# Patient Record
Sex: Male | Born: 1935 | Race: White | Hispanic: No | Smoking: Never smoker
Health system: Southern US, Community
[De-identification: ages and names within clinical notes are randomized; demographics above are authoritative.]

## PROBLEM LIST (undated history)

## (undated) DIAGNOSIS — E7800 Pure hypercholesterolemia, unspecified: Secondary | ICD-10-CM

## (undated) DIAGNOSIS — G459 Transient cerebral ischemic attack, unspecified: Secondary | ICD-10-CM

## (undated) DIAGNOSIS — D509 Iron deficiency anemia, unspecified: Secondary | ICD-10-CM

## (undated) DIAGNOSIS — K219 Gastro-esophageal reflux disease without esophagitis: Secondary | ICD-10-CM

## (undated) DIAGNOSIS — I10 Essential (primary) hypertension: Secondary | ICD-10-CM

## (undated) DIAGNOSIS — J309 Allergic rhinitis, unspecified: Secondary | ICD-10-CM

## (undated) DIAGNOSIS — M1990 Unspecified osteoarthritis, unspecified site: Secondary | ICD-10-CM

## (undated) DIAGNOSIS — E119 Type 2 diabetes mellitus without complications: Secondary | ICD-10-CM

## (undated) DIAGNOSIS — N139 Obstructive and reflux uropathy, unspecified: Secondary | ICD-10-CM

## (undated) DIAGNOSIS — N400 Enlarged prostate without lower urinary tract symptoms: Secondary | ICD-10-CM

## (undated) DIAGNOSIS — C4490 Unspecified malignant neoplasm of skin, unspecified: Secondary | ICD-10-CM

## (undated) HISTORY — DX: Pure hypercholesterolemia, unspecified: E78.00

## (undated) HISTORY — DX: Iron deficiency anemia, unspecified: D50.9

## (undated) HISTORY — PX: SKIN CANCER EXCISION: SHX779

## (undated) HISTORY — DX: Essential (primary) hypertension: I10

## (undated) HISTORY — DX: Unspecified malignant neoplasm of skin, unspecified: C44.90

## (undated) HISTORY — PX: CATARACT EXTRACTION: SUR2

## (undated) HISTORY — DX: Benign prostatic hyperplasia without lower urinary tract symptoms: N40.0

## (undated) HISTORY — DX: Unspecified osteoarthritis, unspecified site: M19.90

## (undated) HISTORY — DX: Gastro-esophageal reflux disease without esophagitis: K21.9

## (undated) HISTORY — PX: SQUAMOUS CELL CARCINOMA EXCISION: SHX2433

## (undated) HISTORY — DX: Obstructive and reflux uropathy, unspecified: N13.9

## (undated) HISTORY — DX: Allergic rhinitis, unspecified: J30.9

## (undated) HISTORY — DX: Type 2 diabetes mellitus without complications: E11.9

## (undated) HISTORY — DX: Transient cerebral ischemic attack, unspecified: G45.9

## (undated) MED FILL — Iron Sucrose Inj 20 MG/ML (Fe Equiv): INTRAVENOUS | Qty: 10 | Status: AC

---
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
## 1993-11-05 HISTORY — PX: ROTATOR CUFF REPAIR: SHX139

## 2004-08-22 ENCOUNTER — Ambulatory Visit: Payer: Self-pay | Admitting: Gastroenterology

## 2005-07-10 ENCOUNTER — Ambulatory Visit: Payer: Self-pay | Admitting: Gastroenterology

## 2006-05-21 ENCOUNTER — Ambulatory Visit: Payer: Self-pay | Admitting: Unknown Physician Specialty

## 2006-12-05 ENCOUNTER — Ambulatory Visit: Payer: Self-pay | Admitting: Cardiology

## 2007-01-29 DIAGNOSIS — C4491 Basal cell carcinoma of skin, unspecified: Secondary | ICD-10-CM

## 2007-01-29 HISTORY — DX: Basal cell carcinoma of skin, unspecified: C44.91

## 2007-04-01 DIAGNOSIS — C4492 Squamous cell carcinoma of skin, unspecified: Secondary | ICD-10-CM

## 2007-04-01 HISTORY — DX: Squamous cell carcinoma of skin, unspecified: C44.92

## 2007-06-15 ENCOUNTER — Emergency Department (HOSPITAL_COMMUNITY): Admission: EM | Admit: 2007-06-15 | Discharge: 2007-06-15 | Payer: Self-pay | Admitting: Emergency Medicine

## 2007-06-23 ENCOUNTER — Ambulatory Visit: Payer: Self-pay | Admitting: Cardiology

## 2007-06-25 ENCOUNTER — Ambulatory Visit: Payer: Self-pay

## 2007-07-15 ENCOUNTER — Ambulatory Visit: Payer: Self-pay | Admitting: Cardiology

## 2007-07-16 ENCOUNTER — Ambulatory Visit: Payer: Self-pay | Admitting: Cardiology

## 2010-07-07 ENCOUNTER — Encounter: Payer: Self-pay | Admitting: Cardiovascular Disease

## 2010-08-01 ENCOUNTER — Telehealth: Payer: Self-pay | Admitting: Cardiology

## 2010-08-15 ENCOUNTER — Ambulatory Visit: Payer: Self-pay | Admitting: Cardiovascular Disease

## 2010-08-15 DIAGNOSIS — E7800 Pure hypercholesterolemia, unspecified: Secondary | ICD-10-CM | POA: Insufficient documentation

## 2010-08-15 DIAGNOSIS — I10 Essential (primary) hypertension: Secondary | ICD-10-CM | POA: Insufficient documentation

## 2010-08-15 DIAGNOSIS — R002 Palpitations: Secondary | ICD-10-CM | POA: Insufficient documentation

## 2010-08-15 HISTORY — DX: Essential (primary) hypertension: I10

## 2010-12-05 NOTE — Progress Notes (Signed)
Summary: pt has irregular heart rate   Phone Note Call from Patient Call back at Home Phone [phone]   Caller: Patient Reason for Call: Talk to Nurse, Talk to Doctor Summary of Call: pt has had an irregular heart rate for several days and some SOB but not much and wants to know if he needs to be seen asap or can he wait for next avail. Initial call taken by: Omer Jack,  August 01, 2010 10:40 AM  Follow-up for Phone Call        I spoke with the pt. He states that over the last several days, he has noticed his heart skipping in the afternoons and evenings. He denies any CP or SOB. He had a workup in 2008 for chest discomfort and palpitations, which was essentially normal. The pt states he thinks his palpitations are similar to 2008. I explained I do not think this is urgent. He states he has talked with his PCP about his symptoms, and he was directed back to cardiology. I explained I will review with Dr. Daleen Squibb and call him back. He is agreeable.  Follow-up by: Sherri Rad, RN, BSN,  August 01, 2010 11:00 AM  Additional Follow-up for Phone Call Additional follow up Details #1::        I reviewed the above with Dr. Daleen Squibb. He states he would like the pt to see Dr. Mariah Milling in [place] since he has not seen the pt in 3 years. I have made the pt aware of this and he is agreeable. He will call our office back should his palpitations become more irregular or he become symptomatic with them. He voices understanding. Additional Follow-up by: Sherri Rad, RN, BSN,  August 01, 2010 11:55 AM

## 2010-12-05 NOTE — Assessment & Plan Note (Signed)
Summary: ec6   Visit Type:  Initial Consult Referring Provider:  Dr. Daleen Squibb Primary Provider:  Herbie Drape.  CC:  c/o palpitations..  History of Present Illness: [name] is a pleasant 77 yo male with a h/o HTN, diet controled diabetes, hyperlipidemia, palpitations 3 years ago presenting with palpitations.   He reports that during the day he feels well and is active with no complaints. When he sits down on the couch, he has palpitations that last sometimes for several minutes. They are not rapid, come and go, are mild in nature. He is not particularly bothered by them.  He is otherwise active and walks 30 minutes a day. He denies any chest pain, SOB.   EKG shows NSR with rate of 66 BPM, LAD  Previous Holter monitor 3 years showed normal sinus rhythm per the notes  His labs from September 2011 showed total cholesterol 127, LDL 63, HDL 40, normal TSH, creatinine 0.9  Preventive Screening-Counseling & Management  Alcohol-Tobacco     Smoking Status: never  Caffeine-Diet-Exercise     Does Patient Exercise: yes      Drug Use:  no.    Current Medications (verified): 1)  Lovastatin 10 Mg Tabs (Lovastatin) .... One Tablet Once Daily 2)  Diovan Hct 320-12.5 Mg Tabs (Valsartan-Hydrochlorothiazide) .... One Tablet Once Daily 3)  Aspir-Low 81 Mg Tbec (Aspirin) .... One Tablet Once Daily 4)  Magnesium 250 Mg Tabs (Magnesium) .... One Tablet Once Daily 5)  Oscal 500/200 D-3 500-200 Mg-Unit Tabs (Calcium-Vitamin D) .... One Tablet Once Daily  Allergies (verified): No Known Drug Allergies  Past History:  Family History: Last updated: 26-Aug-2010 Father: MI; deceased age 3's.  Social History: Last updated: Aug 26, 2010 Retired  Married  Tobacco Use - No.  Alcohol Use - no Regular Exercise - yes-walks everyday Drug Use - no  Risk Factors: Exercise: yes (08/26/2010)  Risk Factors: Smoking Status: never (08-26-2010)  Past Medical  History: Hyperlipidemia Hypertension  Past Surgical History: pilonidal cyst in 1951 removed rotator cuff repair in 2005. tonsillectomy adenoidectomy  Family History: Father: MI; deceased age 92's.  Social History: Retired  Married  Tobacco Use - No.  Alcohol Use - no Regular Exercise - yes-walks everyday Drug Use - no Smoking Status:  never Does Patient Exercise:  yes Drug Use:  no  Review of Systems  The patient denies fever, weight loss, weight gain, vision loss, decreased hearing, hoarseness, chest pain, syncope, dyspnea on exertion, peripheral edema, prolonged cough, abdominal pain, incontinence, muscle weakness, depression, and enlarged lymph nodes.         palpitations  Vital Signs:  Patient profile:   77 year old male Height:      72 inches Weight:      222.75 pounds BMI:     30.32 Pulse rate:   66 / minute BP sitting:   151 / 80  (left arm) Cuff size:   regular  Vitals Entered By: Bishop Dublin, CMA (Aug 26, 2010 10:05 AM)  Physical Exam  General:  Well developed, well nourished, in no acute distress. Head:  normocephalic and atraumatic Neck:  Neck supple, no JVD. No masses, thyromegaly or abnormal cervical nodes. Lungs:  Clear bilaterally to auscultation and percussion. Heart:  Non-displaced PMI, chest non-tender; regular rate and rhythm, S1, S2 without murmurs, rubs or gallops. Carotid upstroke normal, no bruit.   Pedals normal pulses. No edema, no varicosities. Abdomen:  Bowel sounds positive; abdomen soft and non-tender without masses Msk:  Back normal, normal gait. Muscle  strength and tone normal. Pulses:  pulses normal in all 4 extremities Extremities:  No clubbing or cyanosis. Neurologic:  Alert and oriented x 3. Skin:  Intact without lesions or rashes. Psych:  Normal affect.   Impression & Recommendations:  Problem # 1:  PALPITATIONS, OCCASIONAL (ICD-785.1) etiology of his palpitations is likely secondary to APCs or PVCs. They are  rare, happen when he is at rest and he is not particularly bothered by them. He does not want to start a medication such as a beta Osborn for his symptoms Her last and contact me if his symptoms get much worse at which time we could perform a cardiac monitor.  His updated medication list for this problem includes:    Aspir-low 81 Mg Tbec (Aspirin) ..... One tablet once daily  Orders: EKG w/ Interpretation (93000)  Problem # 2:  HYPERTENSION, BENIGN (ICD-401.1) Blood pressure is well controlled on today's visit. I suggested that if he has difficulty paying for Diovan, Dr. Lin Givens or myself could potentially change him to losartan HCT. His blood pressure is borderline elevated and I've asked him to check his pressures at home.  His updated medication list for this problem includes:    Diovan Hct 320-12.5 Mg Tabs (Valsartan-hydrochlorothiazide) ..... One tablet once daily    Aspir-low 81 Mg Tbec (Aspirin) ..... One tablet once daily  Problem # 3:  HYPERLIPIDEMIA-MIXED (ICD-272.4) Cholesterol is well controlled on his current medication regimen.  His updated medication list for this problem includes:    Lovastatin 10 Mg Tabs (Lovastatin) ..... One tablet once daily

## 2011-03-20 NOTE — Assessment & Plan Note (Signed)
[facility] OFFICE NOTE   NAME:[name], [name]                    MRN:          [id number]  DATE:06/23/2007                            DOB:          February 15, 1926    [name] comes in today after visiting the emergency room at [facility] on June 15, 2007 with chest discomfort and palpitations.   He was working on a closet with his son on Friday the 9th. He did not  feel well so he went home. He had some chest heaviness that was just  there throughout the night. He  said it was a little bit worse when he  took a breath. Saturday morning continued to feel no so normal. On  Sunday morning he had some irregularity to his heart rhythm. He was  taken to [facility] by private vehicle.   There his exam was unrevealing. His CBC, chemistries, LFTs, cardiac  markers, and D-Dimer were unremarkable. His chest x-ray showed no acute  cardiopulmonary disease. I do not have a copy of the EKG.   Since then he has not had any further complaints.   As outlined in my original note December 05, 2006 he has multiple risk  factors.   PHYSICAL EXAMINATION:  His blood pressure is 110/62, his pulse is 78 and  regular. The rest of the exam is unchanged.   His EKG is normal except for an RSR prime in V1 and V2 with a left axis  deviation. This is unchanged from his previous ECG.   ASSESSMENT/PLAN:  With [name] discomfort and his multiple  cardiac risk factors, I think an exercise rest/stress Myoview is  indicated. This will rule out any obstructive coronary disease and I and  his wife have spoken today,   In regard to the palpitations, I think a CardioNet monitor for a couple  of weeks is in order.   We will arrange these. I will plan on seeing him back in a couple of  weeks.     Thomas C. Daleen Squibb, MD, [facility]  Electronically Signed    TCW/MedQ  DD: 06/23/2007  DT: 06/23/2007  Job #: [id number]   cc:   Francia Greaves, Dr.

## 2011-03-20 NOTE — Assessment & Plan Note (Signed)
[facility] OFFICE NOTE   NAME:PENNINGTONIsayah, [name]                    MRN:          [id number]  DATE:07/16/2007                            DOB:          [birth year]    Mr. [name] returns today to discuss the findings of his stress  Myoview as well as his CardioNet monitor.  His Myoview showed him to  exercise for 6 minutes and 15 seconds.  His peak heart rate was 116  beats per minute which is 82% of predicted maximum.  Met level achieved  was 7.   Scan showed normal contractility and normal thickening in all areas of  myocardium.  His EF was 76%.  There was no evidence of ischemia or scar.   His CardioNet monitor showed normal sinus rhythm.  He is ready to turn  it in.   I have reviewed the findings with Mr. [name] and his wife with a  heart model.  I do not see any reason to continue wearing the CardioNet  monitor at this time.  We will have him mail it back in.  I advised him  to stay with aspirin, Lovastatin, and Diovan hydrochlorothiazide as well  as his Niacin.  He will follow up with Dr. Francia Greaves in her good  care.  I will plan on seeing him back p.r.n.     Thomas C. Daleen Squibb, MD, [facility]  Electronically Signed    TCW/MedQ  DD: 07/16/2007  DT: 07/16/2007  Job #: [id number]   cc:   Francia Greaves, M.D., [facility]

## 2011-03-23 NOTE — Assessment & Plan Note (Signed)
[facility] OFFICE NOTE   NAME:[name]                    MRN:          [id number]  DATE:12/05/2006                            DOB:          July 11, 1926    [name] is a delightful 77 year old married white male,  husband of a patient of mine, who is self-referred today for a history  of hypertension and hyperlipidemia, and his concern about heart disease.   He is extremely active and walks on a regular basis, and helps his son  with remodeling and household repairs.  He denies any dyspnea on  exertion or chest discomfort, or any other ischemic symptoms.  He denies  any orthopnea, PND, peripheral edema.  He has had no problems with tachy  palpitations, presyncope or syncope.   He has had a history of hypertension, which is being managed by Dr.  Lin Givens.  He also has a history of hyperlipidemia.  He just recently  had blood work which I do not have a copy of.  He would like to bring a  copy to me.  I assured him that Dr. Lin Givens was on top of this, knowing  her excellent reputation.   PAST MEDICAL HISTORY:  He has no known drug allergies.  He does not  smoke, drink, use recreational drugs.  He drinks 1-2 cups of coffee a  day.   His meds are Diovan HCT 320/12.5 daily, lovastatin 20 mg a day,  ibuprofen 800 mg p.r.n., aspirin 81 mg daily, vitamin C and iron.   SURGICAL HISTORY:  1. He had a pilonidal cyst in 1951 removed.  2. Rotator cuff repair in 2005.  3. Tonsillectomy and adenoidectomy in 1940.   SOCIAL HISTORY:  He is retired.  He is married.  He has 2 children.   FAMILY HISTORY:  Positive for his father and his brother having heart  attacks in their early 15s, and both are deceased.   REVIEW OF SYSTEMS:  Other than the HPI, he has a history of some low  grade anemia.  Had an ulcer 40 years ago.  Has a history of chronic  arthritis and borderline diabetes, which is managed by  diet.   PHYSICAL EXAMINATION:  He looks remarkably good, and looks younger than  stated age.  His blood pressure is 153/83 in the right arm.  He assures me that it is  usually around 130-140.  His pulse is 67 and regular.  He is in sinus  rhythm with a left anterior fascicular block, otherwise unremarkable.  He is 6 feet tall, weighs 221 pounds.  HEENT:  Normocephalic, atraumatic.  PERRLA, extraocular movements  intact.  He wears glasses.  Facial symmetry is normal.  Dentition is  unremarkable.  Carotid upstrokes are equal bilaterally without bruits.  There is no JVD, thyroid is not enlarged, trachea is midline.  LUNGS:  Clear to auscultation.  HEART:  Reveals a nondisplaced PMI.  He has a soft systolic murmur along  the left sternal border, S2 split.  No diastolic murmur was appreciated.  ABDOMINAL EXAM:  Soft with good bowel  sounds.  There is no midline or  flank bruit.  There is no evidence of hepatomegaly.  EXTREMITIES:  Reveal no cyanosis, clubbing, or edema.  Pulses are  present.  NEUROLOGIC EXAM:  Intact.  MUSCULOSKELETAL:  Shows some chronic arthritic changes.  SKIN:  Intact and unremarkable.   ASSESSMENT:  [name] is doing well with currently no clinical  manifestations of atherosclerosis.  I assured him that considering his  age, his history of hypertension, borderline diabetes and  hyperlipidemia, that he probably has some atherosclerosis.  At this  point in time I see no reason for any sort of functional study.  I would  like to review his blood work.   We spent a lot of time talking about symptoms of an acute myocardial  infarction and his response with 911, etc.  He and his wife both  understand that time is the most important factor.     Thomas C. Daleen Squibb, MD, [facility]  Electronically Signed    TCW/MedQ  DD: 12/05/2006  DT: 12/05/2006  Job #: [id number]   cc:   Elnita Maxwell L. Lin Givens, MD

## 2011-04-24 ENCOUNTER — Encounter: Payer: Self-pay | Admitting: Cardiology

## 2011-05-30 ENCOUNTER — Telehealth: Payer: Self-pay

## 2011-05-30 NOTE — Telephone Encounter (Signed)
Needs samples of Diovan HCT 320/12.5 mg.  Samples given for one month supply.

## 2011-06-27 ENCOUNTER — Telehealth: Payer: Self-pay

## 2011-06-27 NOTE — Telephone Encounter (Signed)
Would like samples of diovan hct 320/12.5 mg tablets.  Gave samples of diovan hct 320/12.5 mg for one month supply.

## 2011-08-20 LAB — DIFFERENTIAL
Basophils Absolute: 0
Basophils Relative: 0
Eosinophils Relative: 1
Monocytes Absolute: 0.7
Neutro Abs: 6.1

## 2011-08-20 LAB — COMPREHENSIVE METABOLIC PANEL
AST: 20
Albumin: 3.1 — ABNORMAL LOW
Alkaline Phosphatase: 36 — ABNORMAL LOW
BUN: 17
CO2: 26
Chloride: 102
GFR calc non Af Amer: >60
Potassium: 4
Total Bilirubin: 0.8

## 2011-08-20 LAB — POCT CARDIAC MARKERS
CKMB, poc: 3
CKMB, poc: 3.1
Myoglobin, poc: 225
Myoglobin, poc: 265
Operator id: 272551
Operator id: 272551
Troponin i, poc: <0.05
Troponin i, poc: <0.05

## 2011-08-20 LAB — CBC
HCT: 38.5 — ABNORMAL LOW
Hemoglobin: 13.1
Platelets: 240
RBC: 4.1 — ABNORMAL LOW
WBC: 8.4

## 2011-11-12 DIAGNOSIS — M9981 Other biomechanical lesions of cervical region: Secondary | ICD-10-CM | POA: Diagnosis not present

## 2011-11-12 DIAGNOSIS — IMO0002 Reserved for concepts with insufficient information to code with codable children: Secondary | ICD-10-CM | POA: Diagnosis not present

## 2011-11-12 DIAGNOSIS — M999 Biomechanical lesion, unspecified: Secondary | ICD-10-CM | POA: Diagnosis not present

## 2011-11-12 DIAGNOSIS — M5030 Other cervical disc degeneration, unspecified cervical region: Secondary | ICD-10-CM | POA: Diagnosis not present

## 2011-11-27 DIAGNOSIS — D510 Vitamin B12 deficiency anemia due to intrinsic factor deficiency: Secondary | ICD-10-CM | POA: Diagnosis not present

## 2011-12-06 ENCOUNTER — Encounter: Payer: Self-pay | Admitting: Cardiovascular Disease

## 2011-12-06 ENCOUNTER — Ambulatory Visit (INDEPENDENT_AMBULATORY_CARE_PROVIDER_SITE_OTHER): Payer: Medicare Other | Admitting: Cardiovascular Disease

## 2011-12-06 DIAGNOSIS — E119 Type 2 diabetes mellitus without complications: Secondary | ICD-10-CM

## 2011-12-06 DIAGNOSIS — I10 Essential (primary) hypertension: Secondary | ICD-10-CM

## 2011-12-06 DIAGNOSIS — E785 Hyperlipidemia, unspecified: Secondary | ICD-10-CM | POA: Diagnosis not present

## 2011-12-06 DIAGNOSIS — R002 Palpitations: Secondary | ICD-10-CM

## 2011-12-06 NOTE — Progress Notes (Signed)
   Patient ID: [name], male    DOB: 1926/02/28, 78 y.o.   MRN: [id number]  HPI Comments: [name] is a pleasant 78 yo male with a h/o HTN, diet controled diabetes, hemoglobin A1c 7.1, hyperlipidemia, pernicious anemia, history of palpitations  Presenting 4 routine followup.  He reports that he has been doing well. He walks with his wife in the mall on a daily basis. His wife has had some underlying medical issues and walks slowly. He denies any shortness of breath, edema, chest pain, palpitations. Is tolerating his medications well. He was told that he might need to start a diabetes medication if he does not improve his numbers.   EKG shows NSR with rate of 72 BPM, LAD   Previous Holter monitor 3 years showed normal sinus rhythm per the notes  labwork from September 2012 shows total cholesterol 128, LDL 63, HDL 38, creatinine 0.9       Outpatient Encounter Prescriptions as of 12/06/2011  Medication Sig Dispense Refill  . aspirin (ASPIR-LOW) 81 MG EC tablet Take 81 mg by mouth daily.        . calcium-vitamin D (OSCAL 500/200 D-3) 500-200 MG-UNIT per tablet Take 1 tablet by mouth daily.        Marland Kitchen lovastatin (MEVACOR) 10 MG tablet Take 10 mg by mouth daily.        . Magnesium 250 MG TABS Take by mouth daily.        . NON FORMULARY B 12 injection monthly.      . valsartan-hydrochlorothiazide (DIOVAN HCT) 320-12.5 MG per tablet Take 1 tablet by mouth daily.           Review of Systems  Constitutional: Negative.   HENT: Negative.   Eyes: Negative.   Respiratory: Negative.   Cardiovascular: Negative.   Gastrointestinal: Negative.   Musculoskeletal: Negative.   Skin: Negative.   Neurological: Negative.   Hematological: Negative.   Psychiatric/Behavioral: Negative.   All other systems reviewed and are negative.    BP 135/80  Pulse 73  Ht 6' (1.829 m)  Wt 233 lb (105.688 kg)  BMI 31.60 kg/m2  Physical Exam  Nursing note and vitals reviewed. Constitutional: He is  oriented to person, place, and time. He appears well-developed and well-nourished.  HENT:  Head: Normocephalic.  Nose: Nose normal.  Mouth/Throat: Oropharynx is clear and moist.  Eyes: Conjunctivae are normal. Pupils are equal, round, and reactive to light.  Neck: Normal range of motion. Neck supple. No JVD present.  Cardiovascular: Normal rate, regular rhythm, S1 normal, S2 normal, normal heart sounds and intact distal pulses.  Exam reveals no gallop and no friction rub.   No murmur heard. Pulmonary/Chest: Effort normal and breath sounds normal. No respiratory distress. He has no wheezes. He has no rales. He exhibits no tenderness.  Abdominal: Soft. Bowel sounds are normal. He exhibits no distension. There is no tenderness.  Musculoskeletal: Normal range of motion. He exhibits no edema and no tenderness.  Lymphadenopathy:    He has no cervical adenopathy.  Neurological: He is alert and oriented to person, place, and time. Coordination normal.  Skin: Skin is warm and dry. No rash noted. No erythema.  Psychiatric: He has a normal mood and affect. His behavior is normal. Judgment and thought content normal.           Assessment and Plan

## 2011-12-06 NOTE — Assessment & Plan Note (Signed)
We have encouraged continued exercise, careful diet management in an effort to lose weight. 

## 2011-12-06 NOTE — Patient Instructions (Signed)
You are doing well. No medication changes were made.  Please call us if you have new issues that need to be addressed before your next appt.  Your physician wants you to follow-up in: 12 months.  You will receive a reminder letter in the mail two months in advance. If you don't receive a letter, please call our office to schedule the follow-up appointment. 

## 2011-12-06 NOTE — Assessment & Plan Note (Signed)
Palpitations have resolved. No further medication adjustment needed.

## 2011-12-06 NOTE — Assessment & Plan Note (Signed)
Blood pressure is well controlled on today's visit. No changes made to the medications. 

## 2011-12-06 NOTE — Assessment & Plan Note (Signed)
Cholesterol is at goal on the current lipid regimen. No changes to the medications were made.  

## 2011-12-11 DIAGNOSIS — IMO0002 Reserved for concepts with insufficient information to code with codable children: Secondary | ICD-10-CM | POA: Diagnosis not present

## 2011-12-11 DIAGNOSIS — M5030 Other cervical disc degeneration, unspecified cervical region: Secondary | ICD-10-CM | POA: Diagnosis not present

## 2011-12-11 DIAGNOSIS — M999 Biomechanical lesion, unspecified: Secondary | ICD-10-CM | POA: Diagnosis not present

## 2011-12-11 DIAGNOSIS — M9981 Other biomechanical lesions of cervical region: Secondary | ICD-10-CM | POA: Diagnosis not present

## 2011-12-14 DIAGNOSIS — E119 Type 2 diabetes mellitus without complications: Secondary | ICD-10-CM | POA: Diagnosis not present

## 2011-12-17 DIAGNOSIS — M999 Biomechanical lesion, unspecified: Secondary | ICD-10-CM | POA: Diagnosis not present

## 2011-12-17 DIAGNOSIS — D1800 Hemangioma unspecified site: Secondary | ICD-10-CM | POA: Diagnosis not present

## 2011-12-17 DIAGNOSIS — IMO0002 Reserved for concepts with insufficient information to code with codable children: Secondary | ICD-10-CM | POA: Diagnosis not present

## 2011-12-17 DIAGNOSIS — L570 Actinic keratosis: Secondary | ICD-10-CM | POA: Diagnosis not present

## 2011-12-17 DIAGNOSIS — L820 Inflamed seborrheic keratosis: Secondary | ICD-10-CM | POA: Diagnosis not present

## 2011-12-17 DIAGNOSIS — M9981 Other biomechanical lesions of cervical region: Secondary | ICD-10-CM | POA: Diagnosis not present

## 2011-12-17 DIAGNOSIS — M5030 Other cervical disc degeneration, unspecified cervical region: Secondary | ICD-10-CM | POA: Diagnosis not present

## 2011-12-17 DIAGNOSIS — Z85828 Personal history of other malignant neoplasm of skin: Secondary | ICD-10-CM | POA: Diagnosis not present

## 2011-12-20 DIAGNOSIS — I10 Essential (primary) hypertension: Secondary | ICD-10-CM | POA: Diagnosis not present

## 2011-12-20 DIAGNOSIS — E785 Hyperlipidemia, unspecified: Secondary | ICD-10-CM | POA: Diagnosis not present

## 2011-12-20 DIAGNOSIS — E119 Type 2 diabetes mellitus without complications: Secondary | ICD-10-CM | POA: Diagnosis not present

## 2012-01-07 DIAGNOSIS — M999 Biomechanical lesion, unspecified: Secondary | ICD-10-CM | POA: Diagnosis not present

## 2012-01-07 DIAGNOSIS — IMO0002 Reserved for concepts with insufficient information to code with codable children: Secondary | ICD-10-CM | POA: Diagnosis not present

## 2012-01-07 DIAGNOSIS — M9981 Other biomechanical lesions of cervical region: Secondary | ICD-10-CM | POA: Diagnosis not present

## 2012-01-07 DIAGNOSIS — M5030 Other cervical disc degeneration, unspecified cervical region: Secondary | ICD-10-CM | POA: Diagnosis not present

## 2012-01-09 DIAGNOSIS — M999 Biomechanical lesion, unspecified: Secondary | ICD-10-CM | POA: Diagnosis not present

## 2012-01-09 DIAGNOSIS — M5030 Other cervical disc degeneration, unspecified cervical region: Secondary | ICD-10-CM | POA: Diagnosis not present

## 2012-01-09 DIAGNOSIS — IMO0002 Reserved for concepts with insufficient information to code with codable children: Secondary | ICD-10-CM | POA: Diagnosis not present

## 2012-01-09 DIAGNOSIS — M9981 Other biomechanical lesions of cervical region: Secondary | ICD-10-CM | POA: Diagnosis not present

## 2012-01-10 DIAGNOSIS — M9981 Other biomechanical lesions of cervical region: Secondary | ICD-10-CM | POA: Diagnosis not present

## 2012-01-10 DIAGNOSIS — IMO0002 Reserved for concepts with insufficient information to code with codable children: Secondary | ICD-10-CM | POA: Diagnosis not present

## 2012-01-10 DIAGNOSIS — M999 Biomechanical lesion, unspecified: Secondary | ICD-10-CM | POA: Diagnosis not present

## 2012-01-10 DIAGNOSIS — M5030 Other cervical disc degeneration, unspecified cervical region: Secondary | ICD-10-CM | POA: Diagnosis not present

## 2012-01-14 DIAGNOSIS — IMO0002 Reserved for concepts with insufficient information to code with codable children: Secondary | ICD-10-CM | POA: Diagnosis not present

## 2012-01-14 DIAGNOSIS — M999 Biomechanical lesion, unspecified: Secondary | ICD-10-CM | POA: Diagnosis not present

## 2012-01-14 DIAGNOSIS — M5030 Other cervical disc degeneration, unspecified cervical region: Secondary | ICD-10-CM | POA: Diagnosis not present

## 2012-01-14 DIAGNOSIS — M9981 Other biomechanical lesions of cervical region: Secondary | ICD-10-CM | POA: Diagnosis not present

## 2012-01-16 DIAGNOSIS — M999 Biomechanical lesion, unspecified: Secondary | ICD-10-CM | POA: Diagnosis not present

## 2012-01-16 DIAGNOSIS — IMO0002 Reserved for concepts with insufficient information to code with codable children: Secondary | ICD-10-CM | POA: Diagnosis not present

## 2012-01-16 DIAGNOSIS — M9981 Other biomechanical lesions of cervical region: Secondary | ICD-10-CM | POA: Diagnosis not present

## 2012-01-16 DIAGNOSIS — M5030 Other cervical disc degeneration, unspecified cervical region: Secondary | ICD-10-CM | POA: Diagnosis not present

## 2012-01-17 DIAGNOSIS — M9981 Other biomechanical lesions of cervical region: Secondary | ICD-10-CM | POA: Diagnosis not present

## 2012-01-17 DIAGNOSIS — M999 Biomechanical lesion, unspecified: Secondary | ICD-10-CM | POA: Diagnosis not present

## 2012-01-17 DIAGNOSIS — M5030 Other cervical disc degeneration, unspecified cervical region: Secondary | ICD-10-CM | POA: Diagnosis not present

## 2012-01-17 DIAGNOSIS — IMO0002 Reserved for concepts with insufficient information to code with codable children: Secondary | ICD-10-CM | POA: Diagnosis not present

## 2012-01-21 DIAGNOSIS — IMO0002 Reserved for concepts with insufficient information to code with codable children: Secondary | ICD-10-CM | POA: Diagnosis not present

## 2012-01-21 DIAGNOSIS — M9981 Other biomechanical lesions of cervical region: Secondary | ICD-10-CM | POA: Diagnosis not present

## 2012-01-21 DIAGNOSIS — M5030 Other cervical disc degeneration, unspecified cervical region: Secondary | ICD-10-CM | POA: Diagnosis not present

## 2012-01-21 DIAGNOSIS — M999 Biomechanical lesion, unspecified: Secondary | ICD-10-CM | POA: Diagnosis not present

## 2012-01-24 DIAGNOSIS — M999 Biomechanical lesion, unspecified: Secondary | ICD-10-CM | POA: Diagnosis not present

## 2012-01-24 DIAGNOSIS — IMO0002 Reserved for concepts with insufficient information to code with codable children: Secondary | ICD-10-CM | POA: Diagnosis not present

## 2012-01-24 DIAGNOSIS — M5030 Other cervical disc degeneration, unspecified cervical region: Secondary | ICD-10-CM | POA: Diagnosis not present

## 2012-01-24 DIAGNOSIS — M9981 Other biomechanical lesions of cervical region: Secondary | ICD-10-CM | POA: Diagnosis not present

## 2012-01-28 ENCOUNTER — Encounter: Payer: Self-pay | Admitting: Cardiovascular Disease

## 2012-01-28 DIAGNOSIS — M9981 Other biomechanical lesions of cervical region: Secondary | ICD-10-CM | POA: Diagnosis not present

## 2012-01-28 DIAGNOSIS — M5030 Other cervical disc degeneration, unspecified cervical region: Secondary | ICD-10-CM | POA: Diagnosis not present

## 2012-01-28 DIAGNOSIS — IMO0002 Reserved for concepts with insufficient information to code with codable children: Secondary | ICD-10-CM | POA: Diagnosis not present

## 2012-01-28 DIAGNOSIS — M999 Biomechanical lesion, unspecified: Secondary | ICD-10-CM | POA: Diagnosis not present

## 2012-01-31 DIAGNOSIS — M9981 Other biomechanical lesions of cervical region: Secondary | ICD-10-CM | POA: Diagnosis not present

## 2012-01-31 DIAGNOSIS — M999 Biomechanical lesion, unspecified: Secondary | ICD-10-CM | POA: Diagnosis not present

## 2012-01-31 DIAGNOSIS — M5030 Other cervical disc degeneration, unspecified cervical region: Secondary | ICD-10-CM | POA: Diagnosis not present

## 2012-01-31 DIAGNOSIS — IMO0002 Reserved for concepts with insufficient information to code with codable children: Secondary | ICD-10-CM | POA: Diagnosis not present

## 2012-02-04 DIAGNOSIS — M999 Biomechanical lesion, unspecified: Secondary | ICD-10-CM | POA: Diagnosis not present

## 2012-02-04 DIAGNOSIS — M5030 Other cervical disc degeneration, unspecified cervical region: Secondary | ICD-10-CM | POA: Diagnosis not present

## 2012-02-04 DIAGNOSIS — IMO0002 Reserved for concepts with insufficient information to code with codable children: Secondary | ICD-10-CM | POA: Diagnosis not present

## 2012-02-04 DIAGNOSIS — M9981 Other biomechanical lesions of cervical region: Secondary | ICD-10-CM | POA: Diagnosis not present

## 2012-02-07 DIAGNOSIS — M5030 Other cervical disc degeneration, unspecified cervical region: Secondary | ICD-10-CM | POA: Diagnosis not present

## 2012-02-07 DIAGNOSIS — M9981 Other biomechanical lesions of cervical region: Secondary | ICD-10-CM | POA: Diagnosis not present

## 2012-02-07 DIAGNOSIS — IMO0002 Reserved for concepts with insufficient information to code with codable children: Secondary | ICD-10-CM | POA: Diagnosis not present

## 2012-02-07 DIAGNOSIS — M999 Biomechanical lesion, unspecified: Secondary | ICD-10-CM | POA: Diagnosis not present

## 2012-02-12 DIAGNOSIS — M9981 Other biomechanical lesions of cervical region: Secondary | ICD-10-CM | POA: Diagnosis not present

## 2012-02-12 DIAGNOSIS — M5030 Other cervical disc degeneration, unspecified cervical region: Secondary | ICD-10-CM | POA: Diagnosis not present

## 2012-02-12 DIAGNOSIS — M999 Biomechanical lesion, unspecified: Secondary | ICD-10-CM | POA: Diagnosis not present

## 2012-02-12 DIAGNOSIS — IMO0002 Reserved for concepts with insufficient information to code with codable children: Secondary | ICD-10-CM | POA: Diagnosis not present

## 2012-02-13 DIAGNOSIS — D510 Vitamin B12 deficiency anemia due to intrinsic factor deficiency: Secondary | ICD-10-CM | POA: Diagnosis not present

## 2012-02-14 DIAGNOSIS — M9981 Other biomechanical lesions of cervical region: Secondary | ICD-10-CM | POA: Diagnosis not present

## 2012-02-14 DIAGNOSIS — M5030 Other cervical disc degeneration, unspecified cervical region: Secondary | ICD-10-CM | POA: Diagnosis not present

## 2012-02-14 DIAGNOSIS — IMO0002 Reserved for concepts with insufficient information to code with codable children: Secondary | ICD-10-CM | POA: Diagnosis not present

## 2012-02-14 DIAGNOSIS — M999 Biomechanical lesion, unspecified: Secondary | ICD-10-CM | POA: Diagnosis not present

## 2012-02-19 DIAGNOSIS — IMO0002 Reserved for concepts with insufficient information to code with codable children: Secondary | ICD-10-CM | POA: Diagnosis not present

## 2012-02-19 DIAGNOSIS — M9981 Other biomechanical lesions of cervical region: Secondary | ICD-10-CM | POA: Diagnosis not present

## 2012-02-19 DIAGNOSIS — M5030 Other cervical disc degeneration, unspecified cervical region: Secondary | ICD-10-CM | POA: Diagnosis not present

## 2012-02-19 DIAGNOSIS — M999 Biomechanical lesion, unspecified: Secondary | ICD-10-CM | POA: Diagnosis not present

## 2012-02-25 DIAGNOSIS — M5030 Other cervical disc degeneration, unspecified cervical region: Secondary | ICD-10-CM | POA: Diagnosis not present

## 2012-02-25 DIAGNOSIS — IMO0002 Reserved for concepts with insufficient information to code with codable children: Secondary | ICD-10-CM | POA: Diagnosis not present

## 2012-02-25 DIAGNOSIS — M999 Biomechanical lesion, unspecified: Secondary | ICD-10-CM | POA: Diagnosis not present

## 2012-02-25 DIAGNOSIS — M9981 Other biomechanical lesions of cervical region: Secondary | ICD-10-CM | POA: Diagnosis not present

## 2012-03-10 DIAGNOSIS — M9981 Other biomechanical lesions of cervical region: Secondary | ICD-10-CM | POA: Diagnosis not present

## 2012-03-10 DIAGNOSIS — M999 Biomechanical lesion, unspecified: Secondary | ICD-10-CM | POA: Diagnosis not present

## 2012-03-10 DIAGNOSIS — M5030 Other cervical disc degeneration, unspecified cervical region: Secondary | ICD-10-CM | POA: Diagnosis not present

## 2012-03-10 DIAGNOSIS — IMO0002 Reserved for concepts with insufficient information to code with codable children: Secondary | ICD-10-CM | POA: Diagnosis not present

## 2012-03-19 DIAGNOSIS — D510 Vitamin B12 deficiency anemia due to intrinsic factor deficiency: Secondary | ICD-10-CM | POA: Diagnosis not present

## 2012-04-07 DIAGNOSIS — M999 Biomechanical lesion, unspecified: Secondary | ICD-10-CM | POA: Diagnosis not present

## 2012-04-07 DIAGNOSIS — M5030 Other cervical disc degeneration, unspecified cervical region: Secondary | ICD-10-CM | POA: Diagnosis not present

## 2012-04-07 DIAGNOSIS — M9981 Other biomechanical lesions of cervical region: Secondary | ICD-10-CM | POA: Diagnosis not present

## 2012-04-07 DIAGNOSIS — IMO0002 Reserved for concepts with insufficient information to code with codable children: Secondary | ICD-10-CM | POA: Diagnosis not present

## 2012-04-22 DIAGNOSIS — D510 Vitamin B12 deficiency anemia due to intrinsic factor deficiency: Secondary | ICD-10-CM | POA: Diagnosis not present

## 2012-05-13 DIAGNOSIS — IMO0002 Reserved for concepts with insufficient information to code with codable children: Secondary | ICD-10-CM | POA: Diagnosis not present

## 2012-05-13 DIAGNOSIS — M999 Biomechanical lesion, unspecified: Secondary | ICD-10-CM | POA: Diagnosis not present

## 2012-05-13 DIAGNOSIS — M5030 Other cervical disc degeneration, unspecified cervical region: Secondary | ICD-10-CM | POA: Diagnosis not present

## 2012-05-13 DIAGNOSIS — M9981 Other biomechanical lesions of cervical region: Secondary | ICD-10-CM | POA: Diagnosis not present

## 2012-05-23 DIAGNOSIS — E538 Deficiency of other specified B group vitamins: Secondary | ICD-10-CM | POA: Diagnosis not present

## 2012-06-09 DIAGNOSIS — M9981 Other biomechanical lesions of cervical region: Secondary | ICD-10-CM | POA: Diagnosis not present

## 2012-06-09 DIAGNOSIS — IMO0002 Reserved for concepts with insufficient information to code with codable children: Secondary | ICD-10-CM | POA: Diagnosis not present

## 2012-06-09 DIAGNOSIS — M5030 Other cervical disc degeneration, unspecified cervical region: Secondary | ICD-10-CM | POA: Diagnosis not present

## 2012-06-09 DIAGNOSIS — M999 Biomechanical lesion, unspecified: Secondary | ICD-10-CM | POA: Diagnosis not present

## 2012-06-24 DIAGNOSIS — D510 Vitamin B12 deficiency anemia due to intrinsic factor deficiency: Secondary | ICD-10-CM | POA: Diagnosis not present

## 2012-07-08 DIAGNOSIS — IMO0002 Reserved for concepts with insufficient information to code with codable children: Secondary | ICD-10-CM | POA: Diagnosis not present

## 2012-07-08 DIAGNOSIS — M5030 Other cervical disc degeneration, unspecified cervical region: Secondary | ICD-10-CM | POA: Diagnosis not present

## 2012-07-08 DIAGNOSIS — M9981 Other biomechanical lesions of cervical region: Secondary | ICD-10-CM | POA: Diagnosis not present

## 2012-07-08 DIAGNOSIS — M999 Biomechanical lesion, unspecified: Secondary | ICD-10-CM | POA: Diagnosis not present

## 2012-07-14 DIAGNOSIS — IMO0002 Reserved for concepts with insufficient information to code with codable children: Secondary | ICD-10-CM | POA: Diagnosis not present

## 2012-07-14 DIAGNOSIS — M5030 Other cervical disc degeneration, unspecified cervical region: Secondary | ICD-10-CM | POA: Diagnosis not present

## 2012-07-14 DIAGNOSIS — M9981 Other biomechanical lesions of cervical region: Secondary | ICD-10-CM | POA: Diagnosis not present

## 2012-07-14 DIAGNOSIS — M999 Biomechanical lesion, unspecified: Secondary | ICD-10-CM | POA: Diagnosis not present

## 2012-07-21 DIAGNOSIS — M999 Biomechanical lesion, unspecified: Secondary | ICD-10-CM | POA: Diagnosis not present

## 2012-07-21 DIAGNOSIS — M9981 Other biomechanical lesions of cervical region: Secondary | ICD-10-CM | POA: Diagnosis not present

## 2012-07-21 DIAGNOSIS — IMO0002 Reserved for concepts with insufficient information to code with codable children: Secondary | ICD-10-CM | POA: Diagnosis not present

## 2012-07-21 DIAGNOSIS — Z23 Encounter for immunization: Secondary | ICD-10-CM | POA: Diagnosis not present

## 2012-07-21 DIAGNOSIS — M5030 Other cervical disc degeneration, unspecified cervical region: Secondary | ICD-10-CM | POA: Diagnosis not present

## 2012-07-28 DIAGNOSIS — M9981 Other biomechanical lesions of cervical region: Secondary | ICD-10-CM | POA: Diagnosis not present

## 2012-07-28 DIAGNOSIS — M999 Biomechanical lesion, unspecified: Secondary | ICD-10-CM | POA: Diagnosis not present

## 2012-07-28 DIAGNOSIS — M5030 Other cervical disc degeneration, unspecified cervical region: Secondary | ICD-10-CM | POA: Diagnosis not present

## 2012-07-28 DIAGNOSIS — IMO0002 Reserved for concepts with insufficient information to code with codable children: Secondary | ICD-10-CM | POA: Diagnosis not present

## 2012-08-05 ENCOUNTER — Ambulatory Visit: Payer: Medicare Other | Admitting: Internal Medicine

## 2012-08-12 DIAGNOSIS — IMO0002 Reserved for concepts with insufficient information to code with codable children: Secondary | ICD-10-CM | POA: Diagnosis not present

## 2012-08-12 DIAGNOSIS — M5030 Other cervical disc degeneration, unspecified cervical region: Secondary | ICD-10-CM | POA: Diagnosis not present

## 2012-08-12 DIAGNOSIS — M999 Biomechanical lesion, unspecified: Secondary | ICD-10-CM | POA: Diagnosis not present

## 2012-08-12 DIAGNOSIS — M9981 Other biomechanical lesions of cervical region: Secondary | ICD-10-CM | POA: Diagnosis not present

## 2012-09-01 DIAGNOSIS — M9981 Other biomechanical lesions of cervical region: Secondary | ICD-10-CM | POA: Diagnosis not present

## 2012-09-01 DIAGNOSIS — M5030 Other cervical disc degeneration, unspecified cervical region: Secondary | ICD-10-CM | POA: Diagnosis not present

## 2012-09-01 DIAGNOSIS — IMO0002 Reserved for concepts with insufficient information to code with codable children: Secondary | ICD-10-CM | POA: Diagnosis not present

## 2012-09-01 DIAGNOSIS — M999 Biomechanical lesion, unspecified: Secondary | ICD-10-CM | POA: Diagnosis not present

## 2012-09-02 ENCOUNTER — Ambulatory Visit (INDEPENDENT_AMBULATORY_CARE_PROVIDER_SITE_OTHER): Payer: Medicare Other | Admitting: Internal Medicine

## 2012-09-02 ENCOUNTER — Encounter: Payer: Self-pay | Admitting: Internal Medicine

## 2012-09-02 VITALS — BP 138/62 | HR 72 | Temp 97.6°F | Wt 237.0 lb

## 2012-09-02 DIAGNOSIS — E119 Type 2 diabetes mellitus without complications: Secondary | ICD-10-CM

## 2012-09-02 DIAGNOSIS — E7800 Pure hypercholesterolemia, unspecified: Secondary | ICD-10-CM

## 2012-09-02 DIAGNOSIS — I10 Essential (primary) hypertension: Secondary | ICD-10-CM

## 2012-09-02 DIAGNOSIS — E538 Deficiency of other specified B group vitamins: Secondary | ICD-10-CM | POA: Diagnosis not present

## 2012-09-02 DIAGNOSIS — E785 Hyperlipidemia, unspecified: Secondary | ICD-10-CM

## 2012-09-02 MED ORDER — VALSARTAN-HYDROCHLOROTHIAZIDE 320-12.5 MG PO TABS: 1.0000 | ORAL_TABLET | Freq: Every day | ORAL | Status: DC

## 2012-09-02 MED ORDER — ACCU-CHEK SOFTCLIX LANCETS MISC: Status: DC

## 2012-09-02 MED ORDER — GLUCOSE BLOOD VI STRP: ORAL_STRIP | Status: DC

## 2012-09-02 MED ORDER — CYANOCOBALAMIN 1000 MCG/ML IJ SOLN
1000.0000 ug | Freq: Once | INTRAMUSCULAR | Status: AC
  Administered 2012-09-02: 1000 ug via INTRAMUSCULAR

## 2012-09-02 MED ORDER — LOVASTATIN 20 MG PO TABS: 20.0000 mg | ORAL_TABLET | Freq: Every day | ORAL | Status: DC

## 2012-09-02 NOTE — Patient Instructions (Addendum)
It was nice seeing you today.  I am going to get your labs scheduled.  We will notify you of your results once they become available.

## 2012-09-04 ENCOUNTER — Telehealth: Payer: Self-pay | Admitting: Internal Medicine

## 2012-09-04 NOTE — Telephone Encounter (Signed)
A prescription was sent in to [facility] for his Accu check compact and strips. With the instructions to use as directed the insurance will not allow this type of instruction .They have to be specific instructions.

## 2012-09-08 ENCOUNTER — Other Ambulatory Visit (INDEPENDENT_AMBULATORY_CARE_PROVIDER_SITE_OTHER): Payer: Medicare Other

## 2012-09-08 DIAGNOSIS — E119 Type 2 diabetes mellitus without complications: Secondary | ICD-10-CM | POA: Diagnosis not present

## 2012-09-08 DIAGNOSIS — E7800 Pure hypercholesterolemia, unspecified: Secondary | ICD-10-CM | POA: Diagnosis not present

## 2012-09-08 LAB — HEPATIC FUNCTION PANEL
ALT: 37 U/L (ref 0–53)
AST: 31 U/L (ref 0–37)
Albumin: 3.8 g/dL (ref 3.5–5.2)
Total Protein: 8.1 g/dL (ref 6.0–8.3)

## 2012-09-08 LAB — LIPID PANEL
Cholesterol: 131 mg/dL (ref 0–200)
HDL: 30.8 mg/dL — ABNORMAL LOW (ref >39.00)
Triglycerides: 126 mg/dL (ref 0.0–149.0)

## 2012-09-08 LAB — BASIC METABOLIC PANEL
GFR: 93.83 mL/min (ref >60.00)
Potassium: 4.2 mEq/L (ref 3.5–5.1)
Sodium: 133 mEq/L — ABNORMAL LOW (ref 135–145)

## 2012-09-08 LAB — HEMOGLOBIN A1C: Hgb A1c MFr Bld: 7.5 % — ABNORMAL HIGH (ref 4.6–6.5)

## 2012-09-09 ENCOUNTER — Telehealth: Payer: Self-pay | Admitting: *Deleted

## 2012-09-11 ENCOUNTER — Telehealth: Payer: Self-pay | Admitting: Internal Medicine

## 2012-09-11 NOTE — Telephone Encounter (Signed)
I sent you a result note on [name] - I forgot to address the low sodium.  Make sure he is not drinking an excessive amount of free water.  Will follow.  Please schedule him to come back in 2 weeks for a sodium check.  Thanks.

## 2012-09-12 ENCOUNTER — Telehealth: Payer: Self-pay | Admitting: *Deleted

## 2012-09-12 NOTE — Telephone Encounter (Signed)
Received Referral fax from [facility] and forward to Dr. Lorin Picket

## 2012-09-13 ENCOUNTER — Encounter: Payer: Self-pay | Admitting: Internal Medicine

## 2012-09-13 NOTE — Assessment & Plan Note (Signed)
No recorded sugars available.  States it has been under reasonable control in the past.  Have him check and record sugars bid and send in for my review.  Check met b and a1c.  Keep up to date with eye checks.

## 2012-09-13 NOTE — Assessment & Plan Note (Signed)
Low cholesterol diet.  Walks every morning.  Continue Lovastatin.  Check lipid panel and liver function with next labs.

## 2012-09-13 NOTE — Progress Notes (Signed)
  Subjective:    Patient ID: [name], male    DOB: 06-14-1926, 78 y.o.   MRN: [id number]  HPI 78 year old male with past history of diabetes, hypertension and hypercholesterolemia who comes in today for a scheduled follow up.  States he has been doing relatively well.  Has gained some weight.  Walks every morning.  Helping to care for his wife.  Overall he feels he is doing relatively well.  Brought in no recorded sugar readings.  No chest pain or tightness with increased activity or exertion.  Breathing stable.  Bowels moving.   Past Medical History  Diagnosis Date  . Diabetes mellitus   . HTN (hypertension)   . Hypercholesterolemia     Review of Systems Patient denies any headache, lightheadedness or dizziness.  No significant sinus or allergy symptoms.  No chest pain, tightness or palpitations.  No increased shortness of breath, cough or congestion.  No nausea or vomiting.  No abdominal pain or cramping.  No bowel change, such as diarrhea, constipation, BRBPR or melana.  No urine change.        Objective:   Physical Exam Filed Vitals:   09/02/12 1108  BP: 138/62  Pulse: 72  Temp: 97.6 F (44.66 C)   78 year old male in no acute distress.   HEENT:  Nares - clear.  OP- without lesions or erythema.  NECK:  Supple, nontender.  No audible bruit.   HEART:  Appears to be regular. LUNGS:  Without crackles or wheezing audible.  Respirations even and unlabored.   RADIAL PULSE:  Equal bilaterally.  ABDOMEN:  Soft, nontender.  No audible abdominal bruit.   EXTREMITIES:  No increased edema to be present.                     Assessment & Plan:  CARDIOVASCULAR.  Currently doing well.  Continue risk factor modification.  Has seen Dr Mariah Milling.    HEALTH MAINTENANCE.  Schedule a physical - next visit.  Obtain outside records for review.

## 2012-09-13 NOTE — Assessment & Plan Note (Signed)
Blood pressure is under good control.  Continue same medication regimen.  Check metabolic panel with next labs.

## 2012-09-17 NOTE — Telephone Encounter (Signed)
Rx sent to pharmacy   

## 2012-09-18 NOTE — Telephone Encounter (Signed)
Called and gave lab results to patient. Patient lab appointment made for 11/27 8:30am

## 2012-09-18 NOTE — Telephone Encounter (Signed)
Spoke with Mr. Weddington and he stated he is previously went to the [facility] when first diagnosed with diabetes. He stated that he is not interested to go back.

## 2012-09-18 NOTE — Telephone Encounter (Signed)
Asher Muir called patient

## 2012-09-25 DIAGNOSIS — E119 Type 2 diabetes mellitus without complications: Secondary | ICD-10-CM | POA: Diagnosis not present

## 2012-09-29 DIAGNOSIS — M9981 Other biomechanical lesions of cervical region: Secondary | ICD-10-CM | POA: Diagnosis not present

## 2012-09-29 DIAGNOSIS — M999 Biomechanical lesion, unspecified: Secondary | ICD-10-CM | POA: Diagnosis not present

## 2012-09-29 DIAGNOSIS — M5030 Other cervical disc degeneration, unspecified cervical region: Secondary | ICD-10-CM | POA: Diagnosis not present

## 2012-09-29 DIAGNOSIS — IMO0002 Reserved for concepts with insufficient information to code with codable children: Secondary | ICD-10-CM | POA: Diagnosis not present

## 2012-10-01 ENCOUNTER — Other Ambulatory Visit (INDEPENDENT_AMBULATORY_CARE_PROVIDER_SITE_OTHER): Payer: Medicare Other

## 2012-10-01 DIAGNOSIS — E878 Other disorders of electrolyte and fluid balance, not elsewhere classified: Secondary | ICD-10-CM

## 2012-10-01 DIAGNOSIS — Z0000 Encounter for general adult medical examination without abnormal findings: Secondary | ICD-10-CM

## 2012-10-01 LAB — SODIUM: Sodium: 134 mEq/L — ABNORMAL LOW (ref 135–145)

## 2012-10-10 ENCOUNTER — Ambulatory Visit (INDEPENDENT_AMBULATORY_CARE_PROVIDER_SITE_OTHER): Payer: Medicare Other | Admitting: *Deleted

## 2012-10-10 DIAGNOSIS — E538 Deficiency of other specified B group vitamins: Secondary | ICD-10-CM

## 2012-10-10 MED ORDER — CYANOCOBALAMIN 1000 MCG/ML IJ SOLN
1000.0000 ug | Freq: Once | INTRAMUSCULAR | Status: AC
  Administered 2012-10-10: 1000 ug via INTRAMUSCULAR

## 2012-10-13 DIAGNOSIS — C4442 Squamous cell carcinoma of skin of scalp and neck: Secondary | ICD-10-CM | POA: Diagnosis not present

## 2012-10-13 DIAGNOSIS — L578 Other skin changes due to chronic exposure to nonionizing radiation: Secondary | ICD-10-CM | POA: Diagnosis not present

## 2012-10-13 DIAGNOSIS — D485 Neoplasm of uncertain behavior of skin: Secondary | ICD-10-CM | POA: Diagnosis not present

## 2012-10-13 DIAGNOSIS — Z85828 Personal history of other malignant neoplasm of skin: Secondary | ICD-10-CM | POA: Diagnosis not present

## 2012-10-27 DIAGNOSIS — IMO0002 Reserved for concepts with insufficient information to code with codable children: Secondary | ICD-10-CM | POA: Diagnosis not present

## 2012-10-27 DIAGNOSIS — M9981 Other biomechanical lesions of cervical region: Secondary | ICD-10-CM | POA: Diagnosis not present

## 2012-10-27 DIAGNOSIS — M999 Biomechanical lesion, unspecified: Secondary | ICD-10-CM | POA: Diagnosis not present

## 2012-10-27 DIAGNOSIS — M5030 Other cervical disc degeneration, unspecified cervical region: Secondary | ICD-10-CM | POA: Diagnosis not present

## 2012-11-24 DIAGNOSIS — M9981 Other biomechanical lesions of cervical region: Secondary | ICD-10-CM | POA: Diagnosis not present

## 2012-11-24 DIAGNOSIS — IMO0002 Reserved for concepts with insufficient information to code with codable children: Secondary | ICD-10-CM | POA: Diagnosis not present

## 2012-11-24 DIAGNOSIS — M999 Biomechanical lesion, unspecified: Secondary | ICD-10-CM | POA: Diagnosis not present

## 2012-11-24 DIAGNOSIS — M5030 Other cervical disc degeneration, unspecified cervical region: Secondary | ICD-10-CM | POA: Diagnosis not present

## 2012-11-27 ENCOUNTER — Ambulatory Visit (INDEPENDENT_AMBULATORY_CARE_PROVIDER_SITE_OTHER): Payer: Medicare Other | Admitting: *Deleted

## 2012-11-27 DIAGNOSIS — E538 Deficiency of other specified B group vitamins: Secondary | ICD-10-CM | POA: Diagnosis not present

## 2012-11-27 MED ORDER — CYANOCOBALAMIN 1000 MCG/ML IJ SOLN
1000.0000 ug | Freq: Once | INTRAMUSCULAR | Status: AC
  Administered 2012-11-27: 1000 ug via INTRAMUSCULAR

## 2012-12-04 ENCOUNTER — Other Ambulatory Visit: Payer: Self-pay | Admitting: Internal Medicine

## 2012-12-04 MED ORDER — GLUCOSE BLOOD VI STRP: ORAL_STRIP | Status: DC

## 2012-12-04 NOTE — Telephone Encounter (Signed)
[facility] calling.  Pt is switching to them as new pharmacy.  [facility] does not have anything they can fax to [place] as pt has not used them before so they have nothing on file for him.  Pt needs:   Lovastatin 20 mg single dose at bedtime. Accu-chek compact drum strips - pharm needs standard prescription with the diagnosis code written on it.  Please advise.

## 2012-12-04 NOTE — Telephone Encounter (Signed)
Faxed back over to pharmacy.

## 2012-12-04 NOTE — Telephone Encounter (Signed)
Ok to send to [facility]

## 2012-12-10 DIAGNOSIS — C4442 Squamous cell carcinoma of skin of scalp and neck: Secondary | ICD-10-CM | POA: Diagnosis not present

## 2012-12-16 ENCOUNTER — Ambulatory Visit (INDEPENDENT_AMBULATORY_CARE_PROVIDER_SITE_OTHER): Payer: Medicare Other | Admitting: Cardiovascular Disease

## 2012-12-16 ENCOUNTER — Encounter: Payer: Self-pay | Admitting: Cardiovascular Disease

## 2012-12-16 VITALS — BP 155/82 | HR 75 | Ht 72.0 in | Wt 235.5 lb

## 2012-12-16 DIAGNOSIS — E119 Type 2 diabetes mellitus without complications: Secondary | ICD-10-CM | POA: Diagnosis not present

## 2012-12-16 DIAGNOSIS — I10 Essential (primary) hypertension: Secondary | ICD-10-CM | POA: Diagnosis not present

## 2012-12-16 DIAGNOSIS — E785 Hyperlipidemia, unspecified: Secondary | ICD-10-CM

## 2012-12-16 DIAGNOSIS — R002 Palpitations: Secondary | ICD-10-CM | POA: Diagnosis not present

## 2012-12-16 NOTE — Progress Notes (Signed)
Patient ID: [name], male    DOB: 02-24-1926, 79 y.o.   MRN: [id number]  HPI Comments: [name] is a pleasant 79 yo male with a h/o HTN, diet controled diabetes, hemoglobin A1c 7.1, hyperlipidemia, pernicious anemia, history of palpitations  Presenting for routine followup.  He reports that he has been doing well. He walks with his [place] in the mall on a daily basis. He goes to an exercise class II times per week.   He denies any shortness of breath, edema, chest pain, palpitations. Is tolerating his medications well.  Diabetes continues to be a challenge with hemoglobin A1c 7.2. Weight is higher than he would like.   EKG shows NSR with rate of 75 BPM, LAD   Previous Holter monitor 3 years showed normal sinus rhythm per the notes  labwork from September 2012 shows total cholesterol 128, LDL 63, HDL 38, creatinine 0.9       Outpatient Encounter Prescriptions as of 12/16/2012  Medication Sig Dispense Refill  . ACCU-CHEK SOFTCLIX LANCETS lancets Use as instructed  100 each  12  . aspirin (ASPIR-LOW) 81 MG EC tablet Take 81 mg by mouth daily.        . calcium-vitamin D (OSCAL 500/200 D-3) 500-200 MG-UNIT per tablet Take 1 tablet by mouth 2 (two) times daily.       Marland Kitchen glucose blood (ACCU-CHEK COMPACT TEST DRUM) test strip Use as instructed  100 each  12  . lovastatin (MEVACOR) 20 MG tablet Take 10 mg by mouth at bedtime.      . magnesium oxide (MAG-OX) 400 MG tablet Take 400 mg by mouth daily.      . NON FORMULARY B 12 injection monthly.      . valsartan-hydrochlorothiazide (DIOVAN HCT) 320-12.5 MG per tablet Take 1 tablet by mouth daily.  30 tablet  12    Review of Systems  Constitutional: Negative.   HENT: Negative.   Eyes: Negative.   Respiratory: Negative.   Cardiovascular: Negative.   Gastrointestinal: Negative.   Musculoskeletal: Negative.   Skin: Negative.   Neurological: Negative.   Psychiatric/Behavioral: Negative.   All other systems reviewed and are  negative.    BP 155/82  Pulse 75  Ht 6' (1.829 m)  Wt 235 lb 8 oz (106.822 kg)  BMI 31.93 kg/m2  Physical Exam  Nursing note and vitals reviewed. Constitutional: He is oriented to person, place, and time. He appears well-developed and well-nourished.  HENT:  Head: Normocephalic.  Nose: Nose normal.  Mouth/Throat: Oropharynx is clear and moist.  Eyes: Conjunctivae are normal. Pupils are equal, round, and reactive to light.  Neck: Normal range of motion. Neck supple. No JVD present.  Cardiovascular: Normal rate, regular rhythm, S1 normal, S2 normal, normal heart sounds and intact distal pulses.  Exam reveals no gallop and no friction rub.   No murmur heard. Pulmonary/Chest: Effort normal and breath sounds normal. No respiratory distress. He has no wheezes. He has no rales. He exhibits no tenderness.  Abdominal: Soft. Bowel sounds are normal. He exhibits no distension. There is no tenderness.  Musculoskeletal: Normal range of motion. He exhibits no edema and no tenderness.  Lymphadenopathy:    He has no cervical adenopathy.  Neurological: He is alert and oriented to person, place, and time. Coordination normal.  Skin: Skin is warm and dry. No rash noted. No erythema.  Psychiatric: He has a normal mood and affect. His behavior is normal. Judgment and thought content normal.  Assessment and Plan

## 2012-12-16 NOTE — Patient Instructions (Addendum)
Please monitor your blood pressure at home, write on a piece of paper for Dr. Lorin Picket  Try to lose a few pounds   Please call us if you have new issues that need to be addressed before your next appt.  Your physician wants you to follow-up in: 6 months.  You will receive a reminder letter in the mail two months in advance. If you don't receive a letter, please call our office to schedule the follow-up appointment.

## 2012-12-16 NOTE — Assessment & Plan Note (Signed)
Cholesterol is at goal on the current lipid regimen. No changes to the medications were made.  

## 2012-12-16 NOTE — Assessment & Plan Note (Signed)
He reports that her blood pressure at home and is not taking on a regular basis. We have recommended that he closely monitor his blood pressure, right these down. He has followup with Dr. Lorin Picket in the near future. If blood pressure continues to be high, he may need an additional medication. Could also increase HCTZ.

## 2012-12-16 NOTE — Assessment & Plan Note (Signed)
Hemoglobin A1c greater than 7. We have suggested he lose several pounds and increase his exercise, watch his diet more closely.

## 2012-12-22 DIAGNOSIS — M5030 Other cervical disc degeneration, unspecified cervical region: Secondary | ICD-10-CM | POA: Diagnosis not present

## 2012-12-22 DIAGNOSIS — IMO0002 Reserved for concepts with insufficient information to code with codable children: Secondary | ICD-10-CM | POA: Diagnosis not present

## 2012-12-22 DIAGNOSIS — M999 Biomechanical lesion, unspecified: Secondary | ICD-10-CM | POA: Diagnosis not present

## 2012-12-22 DIAGNOSIS — M9981 Other biomechanical lesions of cervical region: Secondary | ICD-10-CM | POA: Diagnosis not present

## 2012-12-29 ENCOUNTER — Other Ambulatory Visit: Payer: BLUE CROSS/BLUE SHIELD

## 2012-12-29 ENCOUNTER — Telehealth: Payer: Self-pay | Admitting: *Deleted

## 2012-12-29 ENCOUNTER — Other Ambulatory Visit: Payer: Self-pay | Admitting: Internal Medicine

## 2012-12-29 DIAGNOSIS — I10 Essential (primary) hypertension: Secondary | ICD-10-CM

## 2012-12-29 DIAGNOSIS — E119 Type 2 diabetes mellitus without complications: Secondary | ICD-10-CM

## 2012-12-29 DIAGNOSIS — R002 Palpitations: Secondary | ICD-10-CM

## 2012-12-29 DIAGNOSIS — E785 Hyperlipidemia, unspecified: Secondary | ICD-10-CM

## 2012-12-29 NOTE — Telephone Encounter (Signed)
Pt is coming in tomorrow (02.25.2014) for labs, what labs and dx would you like? Thank you

## 2012-12-29 NOTE — Telephone Encounter (Signed)
I placed order for labs (cbc,met b, a1c, lipid, liver, tsh)

## 2012-12-29 NOTE — Progress Notes (Signed)
Orders placed for labs

## 2012-12-30 ENCOUNTER — Other Ambulatory Visit (INDEPENDENT_AMBULATORY_CARE_PROVIDER_SITE_OTHER): Payer: Medicare Other

## 2012-12-30 ENCOUNTER — Telehealth: Payer: Self-pay | Admitting: Internal Medicine

## 2012-12-30 DIAGNOSIS — E119 Type 2 diabetes mellitus without complications: Secondary | ICD-10-CM | POA: Diagnosis not present

## 2012-12-30 DIAGNOSIS — E785 Hyperlipidemia, unspecified: Secondary | ICD-10-CM | POA: Diagnosis not present

## 2012-12-30 DIAGNOSIS — I10 Essential (primary) hypertension: Secondary | ICD-10-CM | POA: Diagnosis not present

## 2012-12-30 DIAGNOSIS — R002 Palpitations: Secondary | ICD-10-CM | POA: Diagnosis not present

## 2012-12-30 LAB — CBC WITH DIFFERENTIAL/PLATELET
Basophils Relative: 0.9 % (ref 0.0–3.0)
Eosinophils Absolute: 0.1 10*3/uL (ref 0.0–0.7)
Eosinophils Relative: 2.5 % (ref 0.0–5.0)
HCT: 38 % — ABNORMAL LOW (ref 39.0–52.0)
Lymphs Abs: 1.3 10*3/uL (ref 0.7–4.0)
MCHC: 34.1 g/dL (ref 30.0–36.0)
MCV: 90.8 fl (ref 78.0–100.0)
Monocytes Absolute: 0.4 10*3/uL (ref 0.1–1.0)
Platelets: 212 10*3/uL (ref 150.0–400.0)
WBC: 5.1 10*3/uL (ref 4.5–10.5)

## 2012-12-30 LAB — LIPID PANEL
Cholesterol: 133 mg/dL (ref 0–200)
LDL Cholesterol: 76 mg/dL (ref 0–99)
Triglycerides: 153 mg/dL — ABNORMAL HIGH (ref 0.0–149.0)

## 2012-12-30 LAB — BASIC METABOLIC PANEL
BUN: 23 mg/dL (ref 6–23)
Calcium: 9.1 mg/dL (ref 8.4–10.5)
Chloride: 101 mEq/L (ref 96–112)
Creatinine, Ser: 0.9 mg/dL (ref 0.4–1.5)
GFR: 85.39 mL/min (ref >60.00)

## 2012-12-30 LAB — HEPATIC FUNCTION PANEL
AST: 27 U/L (ref 0–37)
Alkaline Phosphatase: 36 U/L — ABNORMAL LOW (ref 39–117)
Bilirubin, Direct: 0.1 mg/dL (ref 0.0–0.3)
Total Protein: 7.9 g/dL (ref 6.0–8.3)

## 2012-12-30 NOTE — Telephone Encounter (Signed)
Mr Strey came in today and wanted to let you know that all his rx need to go to [facility] except his lancets and test strips these need to go to [facility] [address]

## 2012-12-31 ENCOUNTER — Telehealth: Payer: Self-pay | Admitting: Internal Medicine

## 2012-12-31 NOTE — Telephone Encounter (Signed)
Pt notified of labs via my chart.  

## 2013-01-04 ENCOUNTER — Encounter: Payer: Self-pay | Admitting: Internal Medicine

## 2013-01-05 ENCOUNTER — Encounter: Payer: Self-pay | Admitting: Internal Medicine

## 2013-01-05 ENCOUNTER — Ambulatory Visit (INDEPENDENT_AMBULATORY_CARE_PROVIDER_SITE_OTHER): Payer: Medicare Other | Admitting: Internal Medicine

## 2013-01-05 VITALS — BP 128/68 | HR 84 | Temp 97.9°F | Ht 71.0 in | Wt 233.5 lb

## 2013-01-05 DIAGNOSIS — I10 Essential (primary) hypertension: Secondary | ICD-10-CM

## 2013-01-05 DIAGNOSIS — E785 Hyperlipidemia, unspecified: Secondary | ICD-10-CM

## 2013-01-05 DIAGNOSIS — E7800 Pure hypercholesterolemia, unspecified: Secondary | ICD-10-CM | POA: Diagnosis not present

## 2013-01-05 DIAGNOSIS — E119 Type 2 diabetes mellitus without complications: Secondary | ICD-10-CM | POA: Diagnosis not present

## 2013-01-06 ENCOUNTER — Encounter: Payer: Self-pay | Admitting: Internal Medicine

## 2013-01-06 NOTE — Assessment & Plan Note (Signed)
Blood pressure as outlined.  Looks great today.  Continue same medication regimen.  Follow.

## 2013-01-06 NOTE — Assessment & Plan Note (Signed)
Sugars as outlined.  A1c elevated.  In reviewing his sugars, recent sugars improved.  I am a little hesitant to add medication, given the recent trend of his sugars.  Want to avoid lows.  Will refer him to [facility] for a two hour refresher - to work with a nutritionist.  He eats out a lot.  Discussed portion control.  Will follow.  He will send in sugar reading over the next few weeks.  If elevated, may consider starlix or prandin with his biggest meal - in hopes of avoiding lows.  Follow.  Keep up to date with eye checks.

## 2013-01-06 NOTE — Progress Notes (Signed)
Subjective:    Patient ID: [name], male    DOB: [birth year], 79 y.o.   MRN: [id number]  HPI 79 year old male with past history of diabetes, hypertension and hypercholesterolemia who comes in today for a scheduled follow up.  We had him scheduled for his physical, but he decided - recheck today.  States he has been doing relatively well.  Has gained some weight.  Walks every morning.  Goes to an exercise class 2x/week.   Helping to care for his wife.  Overall he feels he is doing relatively well.  No chest pain or tightness with increased activity or exertion.  Breathing stable.  Bowels moving.  Recent a1c 7.6.  In reviewing his sugar readings, his am sugars recently are running 130-140 - improved.  PM sugars averaging 140-170 - improved.  States he had a skin cancer removed recently and was wondering if this could have contributed to the elevation in the sugar.  His blood pressure on his outside checks (recently) have been averaging 130-140/70s.  Overall he feels good.     Past Medical History  Diagnosis Date  . Diabetes mellitus   . HTN (hypertension)   . Hypercholesterolemia   . Urinary outflow obstruction     mild  . GERD (gastroesophageal reflux disease)   . BPH (benign prostatic hypertrophy)   . Skin cancer   . Degenerative arthritis   . Allergic rhinitis   . Iron deficiency anemia     Current Outpatient Prescriptions on File Prior to Visit  Medication Sig Dispense Refill  . ACCU-CHEK SOFTCLIX LANCETS lancets Use as instructed  100 each  12  . aspirin (ASPIR-LOW) 81 MG EC tablet Take 81 mg by mouth daily.        . calcium-vitamin D (OSCAL 500/200 D-3) 500-200 MG-UNIT per tablet Take 1 tablet by mouth 2 (two) times daily.       Marland Kitchen glucose blood (ACCU-CHEK COMPACT TEST DRUM) test strip Use as instructed  100 each  12  . lovastatin (MEVACOR) 20 MG tablet Take 10 mg by mouth at bedtime.      . magnesium oxide (MAG-OX) 400 MG tablet Take 400 mg by mouth daily.      . NON  FORMULARY B 12 injection monthly.      . valsartan-hydrochlorothiazide (DIOVAN HCT) 320-12.5 MG per tablet Take 1 tablet by mouth daily.  30 tablet  12   No current facility-administered medications on file prior to visit.    Review of Systems Patient denies any headache, lightheadedness or dizziness.  No significant sinus or allergy symptoms.  No chest pain, tightness or palpitations.  No increased shortness of breath, cough or congestion.  No nausea or vomiting.  No abdominal pain or cramping.  No bowel change, such as diarrhea, constipation, BRBPR or melana.  No urine change.  Sugars and blood pressures as outlined.  Going to exercise class.       Objective:   Physical Exam  Filed Vitals:   01/05/13 0813  BP: 128/68  Pulse: 84  Temp: 97.9 F (36.6 C)   Blood pressure recheck:  126/64, pulse 11  79 year old  male in no acute distress.   HEENT:  Nares - clear.  OP- without lesions or erythema.  NECK:  Supple, nontender.  No audible bruit.   HEART:  Appears to be regular. LUNGS:  Without crackles or wheezing audible.  Respirations even and unlabored.   RADIAL PULSE:  Equal bilaterally.  ABDOMEN:  Soft,  nontender.  No audible abdominal bruit.   EXTREMITIES:  No increased edema to be present.  SKIN:  Feet without lesions.                    Assessment & Plan:  CARDIOVASCULAR.  Currently doing well.  Continue risk factor modification.  Has seen Dr Mariah Milling.    HEALTH MAINTENANCE.  Schedule a physical - next visit.   PSA 12/30/12 - .36.

## 2013-01-06 NOTE — Assessment & Plan Note (Signed)
Low cholesterol diet.  Walks every morning.  Continue Lovastatin.  Follow lipid panel and liver function.  Last lipid panel 12/30/12 - total cholesterol 133, triglycerides 153, HDL 27 and LDL 76.

## 2013-01-07 DIAGNOSIS — L578 Other skin changes due to chronic exposure to nonionizing radiation: Secondary | ICD-10-CM | POA: Diagnosis not present

## 2013-01-07 DIAGNOSIS — L570 Actinic keratosis: Secondary | ICD-10-CM | POA: Diagnosis not present

## 2013-01-07 DIAGNOSIS — L821 Other seborrheic keratosis: Secondary | ICD-10-CM | POA: Diagnosis not present

## 2013-01-07 DIAGNOSIS — L820 Inflamed seborrheic keratosis: Secondary | ICD-10-CM | POA: Diagnosis not present

## 2013-01-07 DIAGNOSIS — D485 Neoplasm of uncertain behavior of skin: Secondary | ICD-10-CM | POA: Diagnosis not present

## 2013-01-07 DIAGNOSIS — D219 Benign neoplasm of connective and other soft tissue, unspecified: Secondary | ICD-10-CM | POA: Diagnosis not present

## 2013-01-07 DIAGNOSIS — Z85828 Personal history of other malignant neoplasm of skin: Secondary | ICD-10-CM | POA: Diagnosis not present

## 2013-01-08 ENCOUNTER — Other Ambulatory Visit: Payer: Self-pay | Admitting: Internal Medicine

## 2013-01-08 DIAGNOSIS — Z125 Encounter for screening for malignant neoplasm of prostate: Secondary | ICD-10-CM

## 2013-01-19 ENCOUNTER — Ambulatory Visit: Payer: Self-pay | Admitting: Internal Medicine

## 2013-01-19 DIAGNOSIS — Z7189 Other specified counseling: Secondary | ICD-10-CM | POA: Diagnosis not present

## 2013-01-19 DIAGNOSIS — E119 Type 2 diabetes mellitus without complications: Secondary | ICD-10-CM | POA: Diagnosis not present

## 2013-01-23 ENCOUNTER — Encounter: Payer: Self-pay | Admitting: Internal Medicine

## 2013-01-26 DIAGNOSIS — M999 Biomechanical lesion, unspecified: Secondary | ICD-10-CM | POA: Diagnosis not present

## 2013-01-26 DIAGNOSIS — IMO0002 Reserved for concepts with insufficient information to code with codable children: Secondary | ICD-10-CM | POA: Diagnosis not present

## 2013-01-26 DIAGNOSIS — M5030 Other cervical disc degeneration, unspecified cervical region: Secondary | ICD-10-CM | POA: Diagnosis not present

## 2013-01-26 DIAGNOSIS — M9981 Other biomechanical lesions of cervical region: Secondary | ICD-10-CM | POA: Diagnosis not present

## 2013-02-03 ENCOUNTER — Ambulatory Visit (INDEPENDENT_AMBULATORY_CARE_PROVIDER_SITE_OTHER): Payer: Medicare Other | Admitting: *Deleted

## 2013-02-03 ENCOUNTER — Ambulatory Visit: Payer: Self-pay | Admitting: Internal Medicine

## 2013-02-03 DIAGNOSIS — E538 Deficiency of other specified B group vitamins: Secondary | ICD-10-CM | POA: Diagnosis not present

## 2013-02-03 DIAGNOSIS — E119 Type 2 diabetes mellitus without complications: Secondary | ICD-10-CM | POA: Diagnosis not present

## 2013-02-03 DIAGNOSIS — Z7189 Other specified counseling: Secondary | ICD-10-CM | POA: Diagnosis not present

## 2013-02-03 MED ORDER — CYANOCOBALAMIN 1000 MCG/ML IJ SOLN
1000.0000 ug | Freq: Once | INTRAMUSCULAR | Status: AC
  Administered 2013-02-03: 1000 ug via INTRAMUSCULAR

## 2013-02-16 DIAGNOSIS — M5030 Other cervical disc degeneration, unspecified cervical region: Secondary | ICD-10-CM | POA: Diagnosis not present

## 2013-02-16 DIAGNOSIS — M999 Biomechanical lesion, unspecified: Secondary | ICD-10-CM | POA: Diagnosis not present

## 2013-02-16 DIAGNOSIS — M9981 Other biomechanical lesions of cervical region: Secondary | ICD-10-CM | POA: Diagnosis not present

## 2013-02-16 DIAGNOSIS — IMO0002 Reserved for concepts with insufficient information to code with codable children: Secondary | ICD-10-CM | POA: Diagnosis not present

## 2013-03-05 ENCOUNTER — Ambulatory Visit: Payer: Self-pay | Admitting: Internal Medicine

## 2013-03-05 DIAGNOSIS — Z7189 Other specified counseling: Secondary | ICD-10-CM | POA: Diagnosis not present

## 2013-03-05 DIAGNOSIS — E119 Type 2 diabetes mellitus without complications: Secondary | ICD-10-CM | POA: Diagnosis not present

## 2013-03-13 ENCOUNTER — Ambulatory Visit (INDEPENDENT_AMBULATORY_CARE_PROVIDER_SITE_OTHER): Payer: Medicare Other | Admitting: *Deleted

## 2013-03-13 DIAGNOSIS — E538 Deficiency of other specified B group vitamins: Secondary | ICD-10-CM

## 2013-03-13 MED ORDER — CYANOCOBALAMIN 1000 MCG/ML IJ SOLN
1000.0000 ug | Freq: Once | INTRAMUSCULAR | Status: AC
  Administered 2013-03-13: 1000 ug via INTRAMUSCULAR

## 2013-03-16 DIAGNOSIS — M5030 Other cervical disc degeneration, unspecified cervical region: Secondary | ICD-10-CM | POA: Diagnosis not present

## 2013-03-16 DIAGNOSIS — M9981 Other biomechanical lesions of cervical region: Secondary | ICD-10-CM | POA: Diagnosis not present

## 2013-03-16 DIAGNOSIS — IMO0002 Reserved for concepts with insufficient information to code with codable children: Secondary | ICD-10-CM | POA: Diagnosis not present

## 2013-03-16 DIAGNOSIS — M999 Biomechanical lesion, unspecified: Secondary | ICD-10-CM | POA: Diagnosis not present

## 2013-04-05 ENCOUNTER — Ambulatory Visit: Payer: Self-pay | Admitting: Internal Medicine

## 2013-04-13 DIAGNOSIS — Z85828 Personal history of other malignant neoplasm of skin: Secondary | ICD-10-CM | POA: Diagnosis not present

## 2013-04-13 DIAGNOSIS — IMO0002 Reserved for concepts with insufficient information to code with codable children: Secondary | ICD-10-CM | POA: Diagnosis not present

## 2013-04-13 DIAGNOSIS — M5030 Other cervical disc degeneration, unspecified cervical region: Secondary | ICD-10-CM | POA: Diagnosis not present

## 2013-04-13 DIAGNOSIS — L570 Actinic keratosis: Secondary | ICD-10-CM | POA: Diagnosis not present

## 2013-04-13 DIAGNOSIS — M9981 Other biomechanical lesions of cervical region: Secondary | ICD-10-CM | POA: Diagnosis not present

## 2013-04-13 DIAGNOSIS — M999 Biomechanical lesion, unspecified: Secondary | ICD-10-CM | POA: Diagnosis not present

## 2013-04-13 DIAGNOSIS — L578 Other skin changes due to chronic exposure to nonionizing radiation: Secondary | ICD-10-CM | POA: Diagnosis not present

## 2013-04-14 ENCOUNTER — Ambulatory Visit (INDEPENDENT_AMBULATORY_CARE_PROVIDER_SITE_OTHER): Payer: Medicare Other | Admitting: *Deleted

## 2013-04-14 DIAGNOSIS — D519 Vitamin B12 deficiency anemia, unspecified: Secondary | ICD-10-CM

## 2013-04-14 DIAGNOSIS — D518 Other vitamin B12 deficiency anemias: Secondary | ICD-10-CM

## 2013-04-14 MED ORDER — CYANOCOBALAMIN 1000 MCG/ML IJ SOLN
1000.0000 ug | Freq: Once | INTRAMUSCULAR | Status: AC
  Administered 2013-04-14: 1000 ug via INTRAMUSCULAR

## 2013-05-11 DIAGNOSIS — IMO0002 Reserved for concepts with insufficient information to code with codable children: Secondary | ICD-10-CM | POA: Diagnosis not present

## 2013-05-11 DIAGNOSIS — M5430 Sciatica, unspecified side: Secondary | ICD-10-CM | POA: Diagnosis not present

## 2013-05-11 DIAGNOSIS — M999 Biomechanical lesion, unspecified: Secondary | ICD-10-CM | POA: Diagnosis not present

## 2013-05-14 ENCOUNTER — Other Ambulatory Visit (INDEPENDENT_AMBULATORY_CARE_PROVIDER_SITE_OTHER): Payer: Medicare Other

## 2013-05-14 DIAGNOSIS — E7800 Pure hypercholesterolemia, unspecified: Secondary | ICD-10-CM | POA: Diagnosis not present

## 2013-05-14 DIAGNOSIS — Z125 Encounter for screening for malignant neoplasm of prostate: Secondary | ICD-10-CM | POA: Diagnosis not present

## 2013-05-14 DIAGNOSIS — E119 Type 2 diabetes mellitus without complications: Secondary | ICD-10-CM | POA: Diagnosis not present

## 2013-05-14 LAB — LIPID PANEL
LDL Cholesterol: 88 mg/dL (ref 0–99)
Total CHOL/HDL Ratio: 4
Triglycerides: 124 mg/dL (ref 0.0–149.0)
VLDL: 24.8 mg/dL (ref 0.0–40.0)

## 2013-05-14 LAB — BASIC METABOLIC PANEL
CO2: 31 mEq/L (ref 19–32)
Chloride: 101 mEq/L (ref 96–112)
Creatinine, Ser: 0.9 mg/dL (ref 0.4–1.5)
Potassium: 4.8 mEq/L (ref 3.5–5.1)

## 2013-05-14 LAB — HEPATIC FUNCTION PANEL
Albumin: 3.7 g/dL (ref 3.5–5.2)
Alkaline Phosphatase: 35 U/L — ABNORMAL LOW (ref 39–117)
Total Protein: 7.8 g/dL (ref 6.0–8.3)

## 2013-05-14 LAB — HEMOGLOBIN A1C: Hgb A1c MFr Bld: 7.5 % — ABNORMAL HIGH (ref 4.6–6.5)

## 2013-05-15 ENCOUNTER — Encounter: Payer: Self-pay | Admitting: Internal Medicine

## 2013-05-18 ENCOUNTER — Encounter: Payer: Self-pay | Admitting: Internal Medicine

## 2013-05-18 ENCOUNTER — Ambulatory Visit (INDEPENDENT_AMBULATORY_CARE_PROVIDER_SITE_OTHER): Payer: Medicare Other | Admitting: Internal Medicine

## 2013-05-18 VITALS — BP 120/60 | HR 71 | Temp 97.9°F | Ht 71.0 in | Wt 228.5 lb

## 2013-05-18 DIAGNOSIS — E785 Hyperlipidemia, unspecified: Secondary | ICD-10-CM | POA: Diagnosis not present

## 2013-05-18 DIAGNOSIS — Z1211 Encounter for screening for malignant neoplasm of colon: Secondary | ICD-10-CM

## 2013-05-18 DIAGNOSIS — E119 Type 2 diabetes mellitus without complications: Secondary | ICD-10-CM

## 2013-05-18 DIAGNOSIS — I10 Essential (primary) hypertension: Secondary | ICD-10-CM | POA: Diagnosis not present

## 2013-05-19 ENCOUNTER — Encounter: Payer: Self-pay | Admitting: Internal Medicine

## 2013-05-19 NOTE — Assessment & Plan Note (Signed)
Low cholesterol diet.  Walks every morning.  Continue Lovastatin.  Follow lipid panel and liver function.  Last lipid panel 05/14/13 - total cholesterol 149, triglycerides 124, HDL 36 and LDL 88.

## 2013-05-19 NOTE — Assessment & Plan Note (Signed)
Sugars as outlined.  A1c just checked - 7.5.  Continue diet and exercise.  Follow.

## 2013-05-19 NOTE — Assessment & Plan Note (Signed)
Blood pressure as outlined.  Continue same medication regimen.  Follow.   

## 2013-05-19 NOTE — Progress Notes (Signed)
Subjective:    Patient ID: [name], male    DOB: 20-Apr-1926, 79 y.o.   MRN: [id number]  HPI 79 year old male with past history of diabetes, hypertension and hypercholesterolemia who comes in today to follow up on these issues as well as for a complete physical exam.  States he has been doing relatively well.  Walks every morning.  Helping to care for his wife.  Overall he feels he is doing relatively well.  No chest pain or tightness with increased activity or exertion.  Breathing stable.  Bowels moving.  Recent a1c 7.5.  In reviewing his sugar readings, his am sugars recently are running 120-140.  PM sugars averaging 140s two hours after eating.  Overall he feels he is doing well.       Past Medical History  Diagnosis Date  . Diabetes mellitus   . HTN (hypertension)   . Hypercholesterolemia   . Urinary outflow obstruction     mild  . GERD (gastroesophageal reflux disease)   . BPH (benign prostatic hypertrophy)   . Skin cancer   . Degenerative arthritis   . Allergic rhinitis   . Iron deficiency anemia     Current Outpatient Prescriptions on File Prior to Visit  Medication Sig Dispense Refill  . ACCU-CHEK SOFTCLIX LANCETS lancets Use as instructed  100 each  12  . aspirin (ASPIR-LOW) 81 MG EC tablet Take 81 mg by mouth daily.        . calcium-vitamin D (OSCAL 500/200 D-3) 500-200 MG-UNIT per tablet Take 1 tablet by mouth 2 (two) times daily.       Marland Kitchen glucose blood (ACCU-CHEK COMPACT TEST DRUM) test strip Use as instructed  100 each  12  . lovastatin (MEVACOR) 20 MG tablet Take 10 mg by mouth at bedtime.      . magnesium oxide (MAG-OX) 400 MG tablet Take 400 mg by mouth daily.      . naproxen sodium (ANAPROX) 220 MG tablet Take 220 mg by mouth as needed.      . NON FORMULARY B 12 injection monthly.      . valsartan-hydrochlorothiazide (DIOVAN HCT) 320-12.5 MG per tablet Take 1 tablet by mouth daily.  30 tablet  12   No current facility-administered medications on file prior  to visit.    Review of Systems Patient denies any headache, lightheadedness or dizziness.  No significant sinus or allergy symptoms.  No chest pain, tightness or palpitations.  No increased shortness of breath, cough or congestion.  No nausea or vomiting.  No abdominal pain or cramping.  No bowel change, such as diarrhea, constipation, BRBPR or melana.  No urine change.  Sugars as outlined.  Walking every morning.       Objective:   Physical Exam  Filed Vitals:   05/18/13 0814  BP: 120/60  Pulse: 71  Temp: 97.9 F (36.6 C)    pulse 13  79 year old male in no acute distress.  HEENT:  Nares - clear.  Oropharynx - without lesions. NECK:  Supple.  Nontender.  No audible carotid bruit.  HEART:  Appears to be regular.   LUNGS:  No crackles or wheezing audible.  Respirations even and unlabored.   RADIAL PULSE:  Equal bilaterally.  ABDOMEN:  Soft.  Nontender.  Bowel sounds present and normal.  No audible abdominal bruit.  RECTAL:  Could not appreciate any palpable prostate nodules.  Heme negative.   EXTREMITIES:  No increased edema present.  DP pulses palpable and  equal bilaterally.            Assessment & Plan:  CARDIOVASCULAR.  Currently doing well.  Continue risk factor modification.  Has seen Dr Mariah Milling.    HEALTH MAINTENANCE.  Physical today.   PSA 12/30/12 - .36.

## 2013-05-21 ENCOUNTER — Encounter: Payer: Self-pay | Admitting: Internal Medicine

## 2013-05-21 ENCOUNTER — Other Ambulatory Visit (INDEPENDENT_AMBULATORY_CARE_PROVIDER_SITE_OTHER): Payer: Medicare Other

## 2013-05-21 DIAGNOSIS — Z1211 Encounter for screening for malignant neoplasm of colon: Secondary | ICD-10-CM | POA: Diagnosis not present

## 2013-06-08 DIAGNOSIS — IMO0002 Reserved for concepts with insufficient information to code with codable children: Secondary | ICD-10-CM | POA: Diagnosis not present

## 2013-06-08 DIAGNOSIS — M999 Biomechanical lesion, unspecified: Secondary | ICD-10-CM | POA: Diagnosis not present

## 2013-06-08 DIAGNOSIS — M5430 Sciatica, unspecified side: Secondary | ICD-10-CM | POA: Diagnosis not present

## 2013-06-10 ENCOUNTER — Other Ambulatory Visit: Payer: Self-pay

## 2013-06-15 ENCOUNTER — Ambulatory Visit (INDEPENDENT_AMBULATORY_CARE_PROVIDER_SITE_OTHER): Payer: Medicare Other | Admitting: *Deleted

## 2013-06-15 DIAGNOSIS — E538 Deficiency of other specified B group vitamins: Secondary | ICD-10-CM | POA: Diagnosis not present

## 2013-06-15 DIAGNOSIS — L820 Inflamed seborrheic keratosis: Secondary | ICD-10-CM | POA: Diagnosis not present

## 2013-06-15 DIAGNOSIS — L578 Other skin changes due to chronic exposure to nonionizing radiation: Secondary | ICD-10-CM | POA: Diagnosis not present

## 2013-06-15 DIAGNOSIS — L570 Actinic keratosis: Secondary | ICD-10-CM | POA: Diagnosis not present

## 2013-06-15 DIAGNOSIS — Z85828 Personal history of other malignant neoplasm of skin: Secondary | ICD-10-CM | POA: Diagnosis not present

## 2013-06-15 MED ORDER — CYANOCOBALAMIN 1000 MCG/ML IJ SOLN
1000.0000 ug | Freq: Once | INTRAMUSCULAR | Status: AC
  Administered 2013-06-15: 1000 ug via INTRAMUSCULAR

## 2013-06-25 ENCOUNTER — Ambulatory Visit (INDEPENDENT_AMBULATORY_CARE_PROVIDER_SITE_OTHER): Payer: Medicare Other | Admitting: Cardiovascular Disease

## 2013-06-25 ENCOUNTER — Encounter: Payer: Self-pay | Admitting: Cardiovascular Disease

## 2013-06-25 VITALS — BP 135/75 | HR 69 | Ht 72.0 in | Wt 230.5 lb

## 2013-06-25 DIAGNOSIS — E785 Hyperlipidemia, unspecified: Secondary | ICD-10-CM

## 2013-06-25 DIAGNOSIS — I10 Essential (primary) hypertension: Secondary | ICD-10-CM

## 2013-06-25 DIAGNOSIS — R002 Palpitations: Secondary | ICD-10-CM

## 2013-06-25 DIAGNOSIS — E119 Type 2 diabetes mellitus without complications: Secondary | ICD-10-CM | POA: Diagnosis not present

## 2013-06-25 NOTE — Assessment & Plan Note (Signed)
We have encouraged continued exercise, careful diet management in an effort to lose weight. 

## 2013-06-25 NOTE — Assessment & Plan Note (Signed)
Encouraged him to continue his lovastatin. 

## 2013-06-25 NOTE — Assessment & Plan Note (Signed)
Blood pressure is well controlled on today's visit. No changes made to the medications. 

## 2013-06-25 NOTE — Patient Instructions (Addendum)
You are doing well. No medication changes were made.  Please call us if you have new issues that need to be addressed before your next appt.  Your physician wants you to follow-up in: 12 months.  You will receive a reminder letter in the mail two months in advance. If you don't receive a letter, please call our office to schedule the follow-up appointment. 

## 2013-06-25 NOTE — Progress Notes (Signed)
Patient ID: [name], male    DOB: [birth year], 79 y.o.   MRN: [id number]  HPI Comments: [name] is a pleasant 79 yo male with a h/o HTN, diabetes, hemoglobin A1c >7, obesity , hyperlipidemia, pernicious anemia, history of palpitations  Presenting for routine followup.  He reports that he has been doing well. He reports that he is still very active, tries to exercise several days per week.  He is trying to manage his diabetes with his diet. Weight continues to be a major problem.   He denies any shortness of breath, edema, chest pain, palpitations. Is tolerating his medications well.   EKG shows NSR with rate of 69 BPM, LAD   Previous Holter monitor 3 years showed normal sinus rhythm per the notes    Outpatient Encounter Prescriptions as of 06/25/2013  Medication Sig Dispense Refill  . ACCU-CHEK SOFTCLIX LANCETS lancets Use as instructed  100 each  12  . aspirin (ASPIR-LOW) 81 MG EC tablet Take 81 mg by mouth daily.        . calcium-vitamin D (OSCAL 500/200 D-3) 500-200 MG-UNIT per tablet Take 1 tablet by mouth 2 (two) times daily.       Marland Kitchen docusate sodium (COLACE) 100 MG capsule Take 100 mg by mouth 2 (two) times daily.      Marland Kitchen glucose blood (ACCU-CHEK COMPACT TEST DRUM) test strip Use as instructed  100 each  12  . lovastatin (MEVACOR) 20 MG tablet Take 10 mg by mouth at bedtime.      . magnesium oxide (MAG-OX) 400 MG tablet Take 400 mg by mouth daily.      . naproxen sodium (ANAPROX) 220 MG tablet Take 220 mg by mouth as needed.      . NON FORMULARY B 12 injection monthly.      . valsartan-hydrochlorothiazide (DIOVAN HCT) 320-12.5 MG per tablet Take 1 tablet by mouth daily.  30 tablet  12    Review of Systems  Constitutional: Negative.   HENT: Negative.   Eyes: Negative.   Respiratory: Negative.   Cardiovascular: Negative.   Gastrointestinal: Negative.   Musculoskeletal: Negative.   Skin: Negative.   Neurological: Negative.   Psychiatric/Behavioral: Negative.    All other systems reviewed and are negative.    BP 150/78  Pulse 69  Ht 6' (1.829 m)  Wt 230 lb 8 oz (104.554 kg)  BMI 31.25 kg/m2  Physical Exam  Nursing note and vitals reviewed. Constitutional: He is oriented to person, place, and time. He appears well-developed and well-nourished.  HENT:  Head: Normocephalic.  Nose: Nose normal.  Mouth/Throat: Oropharynx is clear and moist.  Eyes: Conjunctivae are normal. Pupils are equal, round, and reactive to light.  Neck: Normal range of motion. Neck supple. No JVD present.  Cardiovascular: Normal rate, regular rhythm, S1 normal, S2 normal, normal heart sounds and intact distal pulses.  Exam reveals no gallop and no friction rub.   No murmur heard. Pulmonary/Chest: Effort normal and breath sounds normal. No respiratory distress. He has no wheezes. He has no rales. He exhibits no tenderness.  Abdominal: Soft. Bowel sounds are normal. He exhibits no distension. There is no tenderness.  Musculoskeletal: Normal range of motion. He exhibits no edema and no tenderness.  Lymphadenopathy:    He has no cervical adenopathy.  Neurological: He is alert and oriented to person, place, and time. Coordination normal.  Skin: Skin is warm and dry. No rash noted. No erythema.  Psychiatric: He has a normal mood and  affect. His behavior is normal. Judgment and thought content normal.      Assessment and Plan

## 2013-07-07 DIAGNOSIS — M999 Biomechanical lesion, unspecified: Secondary | ICD-10-CM | POA: Diagnosis not present

## 2013-07-07 DIAGNOSIS — M5430 Sciatica, unspecified side: Secondary | ICD-10-CM | POA: Diagnosis not present

## 2013-07-07 DIAGNOSIS — IMO0002 Reserved for concepts with insufficient information to code with codable children: Secondary | ICD-10-CM | POA: Diagnosis not present

## 2013-07-15 DIAGNOSIS — Z23 Encounter for immunization: Secondary | ICD-10-CM | POA: Diagnosis not present

## 2013-07-20 ENCOUNTER — Emergency Department (HOSPITAL_COMMUNITY)
Admission: EM | Admit: 2013-07-20 | Discharge: 2013-07-20 | Disposition: A | Discharge status: Home / Self Care | Payer: Medicare Other

## 2013-07-20 DIAGNOSIS — M5430 Sciatica, unspecified side: Secondary | ICD-10-CM | POA: Diagnosis not present

## 2013-07-20 DIAGNOSIS — M999 Biomechanical lesion, unspecified: Secondary | ICD-10-CM | POA: Diagnosis not present

## 2013-07-20 DIAGNOSIS — IMO0002 Reserved for concepts with insufficient information to code with codable children: Secondary | ICD-10-CM | POA: Diagnosis not present

## 2013-07-21 ENCOUNTER — Ambulatory Visit (INDEPENDENT_AMBULATORY_CARE_PROVIDER_SITE_OTHER): Payer: Medicare Other | Admitting: *Deleted

## 2013-07-21 DIAGNOSIS — E538 Deficiency of other specified B group vitamins: Secondary | ICD-10-CM | POA: Diagnosis not present

## 2013-07-21 MED ORDER — CYANOCOBALAMIN 1000 MCG/ML IJ SOLN
1000.0000 ug | Freq: Once | INTRAMUSCULAR | Status: AC
  Administered 2013-07-21: 1000 ug via INTRAMUSCULAR

## 2013-08-03 DIAGNOSIS — M999 Biomechanical lesion, unspecified: Secondary | ICD-10-CM | POA: Diagnosis not present

## 2013-08-03 DIAGNOSIS — M5430 Sciatica, unspecified side: Secondary | ICD-10-CM | POA: Diagnosis not present

## 2013-08-03 DIAGNOSIS — IMO0002 Reserved for concepts with insufficient information to code with codable children: Secondary | ICD-10-CM | POA: Diagnosis not present

## 2013-08-21 ENCOUNTER — Ambulatory Visit (INDEPENDENT_AMBULATORY_CARE_PROVIDER_SITE_OTHER): Payer: Medicare Other | Admitting: *Deleted

## 2013-08-21 DIAGNOSIS — E538 Deficiency of other specified B group vitamins: Secondary | ICD-10-CM

## 2013-08-21 MED ORDER — CYANOCOBALAMIN 1000 MCG/ML IJ SOLN
1000.0000 ug | Freq: Once | INTRAMUSCULAR | Status: AC
  Administered 2013-08-21: 1000 ug via INTRAMUSCULAR

## 2013-08-23 ENCOUNTER — Encounter: Payer: Self-pay | Admitting: Internal Medicine

## 2013-08-23 ENCOUNTER — Other Ambulatory Visit: Payer: Self-pay | Admitting: Internal Medicine

## 2013-08-23 DIAGNOSIS — Z0000 Encounter for general adult medical examination without abnormal findings: Secondary | ICD-10-CM

## 2013-08-23 NOTE — Progress Notes (Signed)
Order placed for varicella titer at pts request.

## 2013-08-24 DIAGNOSIS — IMO0002 Reserved for concepts with insufficient information to code with codable children: Secondary | ICD-10-CM | POA: Diagnosis not present

## 2013-08-24 DIAGNOSIS — M999 Biomechanical lesion, unspecified: Secondary | ICD-10-CM | POA: Diagnosis not present

## 2013-08-24 DIAGNOSIS — M5430 Sciatica, unspecified side: Secondary | ICD-10-CM | POA: Diagnosis not present

## 2013-09-08 ENCOUNTER — Other Ambulatory Visit: Payer: Self-pay | Admitting: *Deleted

## 2013-09-14 DIAGNOSIS — M999 Biomechanical lesion, unspecified: Secondary | ICD-10-CM | POA: Diagnosis not present

## 2013-09-14 DIAGNOSIS — IMO0002 Reserved for concepts with insufficient information to code with codable children: Secondary | ICD-10-CM | POA: Diagnosis not present

## 2013-09-14 DIAGNOSIS — M5430 Sciatica, unspecified side: Secondary | ICD-10-CM | POA: Diagnosis not present

## 2013-09-15 ENCOUNTER — Other Ambulatory Visit (INDEPENDENT_AMBULATORY_CARE_PROVIDER_SITE_OTHER): Payer: Medicare Other

## 2013-09-15 ENCOUNTER — Encounter: Payer: Self-pay | Admitting: Internal Medicine

## 2013-09-15 DIAGNOSIS — E119 Type 2 diabetes mellitus without complications: Secondary | ICD-10-CM

## 2013-09-15 DIAGNOSIS — I10 Essential (primary) hypertension: Secondary | ICD-10-CM

## 2013-09-15 DIAGNOSIS — E785 Hyperlipidemia, unspecified: Secondary | ICD-10-CM | POA: Diagnosis not present

## 2013-09-15 LAB — BASIC METABOLIC PANEL
CO2: 30 mEq/L (ref 19–32)
Chloride: 96 mEq/L (ref 96–112)
Creatinine, Ser: 0.8 mg/dL (ref 0.4–1.5)
Potassium: 4.8 mEq/L (ref 3.5–5.1)

## 2013-09-15 LAB — HEPATIC FUNCTION PANEL
ALT: 23 U/L (ref 0–53)
AST: 23 U/L (ref 0–37)
Alkaline Phosphatase: 33 U/L — ABNORMAL LOW (ref 39–117)
Total Bilirubin: 1 mg/dL (ref 0.3–1.2)

## 2013-09-15 LAB — LIPID PANEL
Cholesterol: 138 mg/dL (ref 0–200)
Total CHOL/HDL Ratio: 4
Triglycerides: 122 mg/dL (ref 0.0–149.0)

## 2013-09-17 NOTE — Telephone Encounter (Signed)
Mailed unread message to pt  

## 2013-09-18 ENCOUNTER — Encounter: Payer: Self-pay | Admitting: Internal Medicine

## 2013-09-18 ENCOUNTER — Ambulatory Visit (INDEPENDENT_AMBULATORY_CARE_PROVIDER_SITE_OTHER): Payer: Medicare Other | Admitting: Internal Medicine

## 2013-09-18 VITALS — BP 120/60 | HR 73 | Temp 97.8°F | Ht 71.0 in | Wt 226.5 lb

## 2013-09-18 DIAGNOSIS — E119 Type 2 diabetes mellitus without complications: Secondary | ICD-10-CM | POA: Diagnosis not present

## 2013-09-18 DIAGNOSIS — E871 Hypo-osmolality and hyponatremia: Secondary | ICD-10-CM | POA: Diagnosis not present

## 2013-09-18 DIAGNOSIS — E785 Hyperlipidemia, unspecified: Secondary | ICD-10-CM | POA: Diagnosis not present

## 2013-09-18 DIAGNOSIS — I10 Essential (primary) hypertension: Secondary | ICD-10-CM | POA: Diagnosis not present

## 2013-09-18 LAB — SODIUM: Sodium: 134 mEq/L — ABNORMAL LOW (ref 135–145)

## 2013-09-18 NOTE — Progress Notes (Signed)
Pre-visit discussion using our clinic review tool. No additional management support is needed unless otherwise documented below in the visit note.  

## 2013-09-18 NOTE — Progress Notes (Signed)
Subjective:    Patient ID: [name], male    DOB: [birth year], 79 y.o.   MRN: [id number]  HPI 79 year old male with past history of diabetes, hypertension and hypercholesterolemia who comes in today for a scheduled follow up.  States he has been doing relatively well.  Walks every morning.  Helping to care for his wife.  Overall he feels he is doing relatively well.  No chest pain or tightness with increased activity or exertion.  Breathing stable.  Bowels moving.  Recent a1c 7.4.  Has been gradually coming down.   Overall he feels he is doing well.       Past Medical History  Diagnosis Date  . Diabetes mellitus   . HTN (hypertension)   . Hypercholesterolemia   . Urinary outflow obstruction     mild  . GERD (gastroesophageal reflux disease)   . BPH (benign prostatic hypertrophy)   . Skin cancer   . Degenerative arthritis   . Allergic rhinitis   . Iron deficiency anemia     Current Outpatient Prescriptions on File Prior to Visit  Medication Sig Dispense Refill  . ACCU-CHEK SOFTCLIX LANCETS lancets Use as instructed  100 each  12  . aspirin (ASPIR-LOW) 81 MG EC tablet Take 81 mg by mouth daily.        . calcium-vitamin D (OSCAL 500/200 D-3) 500-200 MG-UNIT per tablet Take 1 tablet by mouth 2 (two) times daily.       Marland Kitchen docusate sodium (COLACE) 100 MG capsule Take 100 mg by mouth 2 (two) times daily.      Marland Kitchen glucose blood (ACCU-CHEK COMPACT TEST DRUM) test strip Use as instructed  100 each  12  . lovastatin (MEVACOR) 20 MG tablet Take 10 mg by mouth at bedtime.      . magnesium oxide (MAG-OX) 400 MG tablet Take 400 mg by mouth daily.      . naproxen sodium (ANAPROX) 220 MG tablet Take 220 mg by mouth as needed.      . NON FORMULARY B 12 injection monthly.      . valsartan-hydrochlorothiazide (DIOVAN HCT) 320-12.5 MG per tablet Take 1 tablet by mouth daily.  30 tablet  12   No current facility-administered medications on file prior to visit.    Review of Systems Patient  denies any headache, lightheadedness or dizziness.  No significant sinus or allergy symptoms.  No chest pain, tightness or palpitations.  No increased shortness of breath, cough or congestion.  No nausea or vomiting.  No abdominal pain or cramping.  No bowel change, such as diarrhea, constipation, BRBPR or melana.  No urine change.   Walking every morning.       Objective:   Physical Exam  Filed Vitals:   09/18/13 0820  BP: 120/60  Pulse: 73  Temp: 97.8 F (36.6 C)   Blood pressure 128/70, pulse 37  79 year old male in no acute distress.  HEENT:  Nares - clear.  Oropharynx - without lesions. NECK:  Supple.  Nontender.  No audible carotid bruit.  HEART:  Appears to be regular.   LUNGS:  No crackles or wheezing audible.  Respirations even and unlabored.   RADIAL PULSE:  Equal bilaterally.  ABDOMEN:  Soft.  Nontender.  Bowel sounds present and normal.  No audible abdominal bruit.   EXTREMITIES:  No increased edema present.  DP pulses palpable and equal bilaterally.            Assessment & Plan:  CARDIOVASCULAR.  Currently doing well.  Continue risk factor modification.  Has seen Dr Mariah Milling.    HEALTH MAINTENANCE.  Physical 05/18/13.   PSA 12/30/12 - .36.

## 2013-09-18 NOTE — Assessment & Plan Note (Signed)
Sugars as outlined.  A1c just checked - 7.4.  Continue diet and exercise.  Follow.

## 2013-09-18 NOTE — Assessment & Plan Note (Addendum)
Low cholesterol diet.  Walks every morning.  Continue Lovastatin.  Follow lipid panel and liver function.  Last lipid panel 09/15/13 - total cholesterol 138, triglycerides 122, HDL 31 and LDL 82.

## 2013-09-18 NOTE — Assessment & Plan Note (Addendum)
Blood pressure as outlined.  Overall doing well.   Continue same medication regimen.  Follow.

## 2013-09-20 ENCOUNTER — Encounter: Payer: Self-pay | Admitting: Internal Medicine

## 2013-09-20 DIAGNOSIS — E871 Hypo-osmolality and hyponatremia: Secondary | ICD-10-CM | POA: Insufficient documentation

## 2013-09-20 NOTE — Assessment & Plan Note (Signed)
Has been slightly decreased but relatively stable.  Will recheck his sodium level today.    

## 2013-09-21 ENCOUNTER — Encounter: Payer: Self-pay | Admitting: Internal Medicine

## 2013-09-21 ENCOUNTER — Telehealth: Payer: Self-pay | Admitting: Internal Medicine

## 2013-09-21 ENCOUNTER — Ambulatory Visit (INDEPENDENT_AMBULATORY_CARE_PROVIDER_SITE_OTHER): Payer: Medicare Other | Admitting: *Deleted

## 2013-09-21 DIAGNOSIS — E538 Deficiency of other specified B group vitamins: Secondary | ICD-10-CM

## 2013-09-21 DIAGNOSIS — E871 Hypo-osmolality and hyponatremia: Secondary | ICD-10-CM

## 2013-09-21 MED ORDER — CYANOCOBALAMIN 1000 MCG/ML IJ SOLN
1000.0000 ug | Freq: Once | INTRAMUSCULAR | Status: AC
  Administered 2013-09-21: 1000 ug via INTRAMUSCULAR

## 2013-09-21 NOTE — Telephone Encounter (Signed)
Pt notified of lab result via my chart.  He needs a f/u non fasting lab appt in 2 months.  Please contact him with an appt date and time.  Thanks.

## 2013-09-22 ENCOUNTER — Other Ambulatory Visit: Payer: Self-pay | Admitting: *Deleted

## 2013-09-22 MED ORDER — VALSARTAN-HYDROCHLOROTHIAZIDE 320-12.5 MG PO TABS: 1.0000 | ORAL_TABLET | Freq: Every day | ORAL | Status: DC

## 2013-09-23 NOTE — Telephone Encounter (Signed)
Appointment 1/20 °Pt aware °

## 2013-09-23 NOTE — Telephone Encounter (Signed)
appo

## 2013-09-28 DIAGNOSIS — M999 Biomechanical lesion, unspecified: Secondary | ICD-10-CM | POA: Diagnosis not present

## 2013-09-28 DIAGNOSIS — M5430 Sciatica, unspecified side: Secondary | ICD-10-CM | POA: Diagnosis not present

## 2013-09-28 DIAGNOSIS — IMO0002 Reserved for concepts with insufficient information to code with codable children: Secondary | ICD-10-CM | POA: Diagnosis not present

## 2013-10-13 ENCOUNTER — Telehealth: Payer: Self-pay | Admitting: *Deleted

## 2013-10-13 NOTE — Telephone Encounter (Signed)
Refill Request  Acc-chek compact plus strips

## 2013-10-15 ENCOUNTER — Other Ambulatory Visit: Payer: Self-pay | Admitting: *Deleted

## 2013-10-15 MED ORDER — GLUCOSE BLOOD VI STRP: ORAL_STRIP | Status: DC

## 2013-10-16 ENCOUNTER — Telehealth: Payer: Self-pay | Admitting: Internal Medicine

## 2013-10-16 ENCOUNTER — Other Ambulatory Visit: Payer: Self-pay | Admitting: Internal Medicine

## 2013-10-16 ENCOUNTER — Telehealth: Payer: Self-pay | Admitting: *Deleted

## 2013-10-16 DIAGNOSIS — E119 Type 2 diabetes mellitus without complications: Secondary | ICD-10-CM

## 2013-10-16 MED ORDER — GLUCOSE BLOOD VI STRP: ORAL_STRIP | Status: DC

## 2013-10-16 NOTE — Assessment & Plan Note (Signed)
Sugars as outlined.  A1c just checked - 7.4.  Continue diet and exercise.  Follow.    

## 2013-10-16 NOTE — Telephone Encounter (Signed)
Pt would like to ensure that we are aware that his diabetic supplies go to [facility] and all other scripts go to [facility].

## 2013-10-16 NOTE — Telephone Encounter (Signed)
Error

## 2013-10-17 NOTE — Telephone Encounter (Signed)
Aware, Rx were sent in correctly on 12/12

## 2013-10-19 ENCOUNTER — Other Ambulatory Visit: Payer: Self-pay | Admitting: *Deleted

## 2013-10-19 DIAGNOSIS — M999 Biomechanical lesion, unspecified: Secondary | ICD-10-CM | POA: Diagnosis not present

## 2013-10-19 DIAGNOSIS — M5430 Sciatica, unspecified side: Secondary | ICD-10-CM | POA: Diagnosis not present

## 2013-10-19 DIAGNOSIS — IMO0002 Reserved for concepts with insufficient information to code with codable children: Secondary | ICD-10-CM | POA: Diagnosis not present

## 2013-10-19 MED ORDER — GLUCOSE BLOOD VI STRP: ORAL_STRIP | Status: DC

## 2013-10-20 NOTE — Telephone Encounter (Signed)
Pt states he dropped off a paper yesterday (12/15) from his pharmacy.  States they need specific instructions on how many times per day he is to check blood sugars for Medicare.  Asking if this has been completed and faxed back in.

## 2013-10-20 NOTE — Telephone Encounter (Signed)
Form completed.  Placed in your box.   

## 2013-10-20 NOTE — Telephone Encounter (Signed)
Form faxed to pharmacy & pt notified

## 2013-10-23 ENCOUNTER — Ambulatory Visit (INDEPENDENT_AMBULATORY_CARE_PROVIDER_SITE_OTHER): Payer: Medicare Other

## 2013-10-23 DIAGNOSIS — E538 Deficiency of other specified B group vitamins: Secondary | ICD-10-CM | POA: Diagnosis not present

## 2013-10-23 MED ORDER — CYANOCOBALAMIN 1000 MCG/ML IJ SOLN
1000.0000 ug | Freq: Once | INTRAMUSCULAR | Status: AC
  Administered 2013-10-23: 1000 ug via INTRAMUSCULAR

## 2013-10-26 DIAGNOSIS — L820 Inflamed seborrheic keratosis: Secondary | ICD-10-CM | POA: Diagnosis not present

## 2013-11-16 DIAGNOSIS — M5430 Sciatica, unspecified side: Secondary | ICD-10-CM | POA: Diagnosis not present

## 2013-11-16 DIAGNOSIS — M999 Biomechanical lesion, unspecified: Secondary | ICD-10-CM | POA: Diagnosis not present

## 2013-11-16 DIAGNOSIS — IMO0002 Reserved for concepts with insufficient information to code with codable children: Secondary | ICD-10-CM | POA: Diagnosis not present

## 2013-11-24 ENCOUNTER — Other Ambulatory Visit (INDEPENDENT_AMBULATORY_CARE_PROVIDER_SITE_OTHER): Payer: Medicare Other

## 2013-11-24 ENCOUNTER — Encounter: Payer: Self-pay | Admitting: Internal Medicine

## 2013-11-24 DIAGNOSIS — E871 Hypo-osmolality and hyponatremia: Secondary | ICD-10-CM | POA: Diagnosis not present

## 2013-11-24 LAB — SODIUM: SODIUM: 136 meq/L (ref 135–145)

## 2013-11-26 NOTE — Telephone Encounter (Signed)
Mailed unread message to pt  

## 2013-12-01 ENCOUNTER — Ambulatory Visit (INDEPENDENT_AMBULATORY_CARE_PROVIDER_SITE_OTHER): Payer: Medicare Other | Admitting: *Deleted

## 2013-12-01 ENCOUNTER — Ambulatory Visit: Payer: Medicare Other

## 2013-12-01 DIAGNOSIS — E538 Deficiency of other specified B group vitamins: Secondary | ICD-10-CM

## 2013-12-01 MED ORDER — CYANOCOBALAMIN 1000 MCG/ML IJ SOLN
1000.0000 ug | Freq: Once | INTRAMUSCULAR | Status: AC
  Administered 2013-12-01: 1000 ug via INTRAMUSCULAR

## 2013-12-14 DIAGNOSIS — IMO0002 Reserved for concepts with insufficient information to code with codable children: Secondary | ICD-10-CM | POA: Diagnosis not present

## 2013-12-14 DIAGNOSIS — M999 Biomechanical lesion, unspecified: Secondary | ICD-10-CM | POA: Diagnosis not present

## 2013-12-14 DIAGNOSIS — M5430 Sciatica, unspecified side: Secondary | ICD-10-CM | POA: Diagnosis not present

## 2013-12-17 DIAGNOSIS — L820 Inflamed seborrheic keratosis: Secondary | ICD-10-CM | POA: Diagnosis not present

## 2013-12-17 DIAGNOSIS — Z85828 Personal history of other malignant neoplasm of skin: Secondary | ICD-10-CM | POA: Diagnosis not present

## 2013-12-17 DIAGNOSIS — L578 Other skin changes due to chronic exposure to nonionizing radiation: Secondary | ICD-10-CM | POA: Diagnosis not present

## 2013-12-17 DIAGNOSIS — L821 Other seborrheic keratosis: Secondary | ICD-10-CM | POA: Diagnosis not present

## 2013-12-17 DIAGNOSIS — L570 Actinic keratosis: Secondary | ICD-10-CM | POA: Diagnosis not present

## 2014-01-04 ENCOUNTER — Ambulatory Visit (INDEPENDENT_AMBULATORY_CARE_PROVIDER_SITE_OTHER): Payer: Medicare Other | Admitting: *Deleted

## 2014-01-04 DIAGNOSIS — E538 Deficiency of other specified B group vitamins: Secondary | ICD-10-CM

## 2014-01-04 MED ORDER — CYANOCOBALAMIN 1000 MCG/ML IJ SOLN
1000.0000 ug | Freq: Once | INTRAMUSCULAR | Status: AC
  Administered 2014-01-04: 1000 ug via INTRAMUSCULAR

## 2014-01-11 DIAGNOSIS — M5430 Sciatica, unspecified side: Secondary | ICD-10-CM | POA: Diagnosis not present

## 2014-01-11 DIAGNOSIS — M999 Biomechanical lesion, unspecified: Secondary | ICD-10-CM | POA: Diagnosis not present

## 2014-01-11 DIAGNOSIS — IMO0002 Reserved for concepts with insufficient information to code with codable children: Secondary | ICD-10-CM | POA: Diagnosis not present

## 2014-01-18 ENCOUNTER — Encounter: Payer: Self-pay | Admitting: Internal Medicine

## 2014-01-18 ENCOUNTER — Ambulatory Visit (INDEPENDENT_AMBULATORY_CARE_PROVIDER_SITE_OTHER): Payer: Medicare Other | Admitting: Internal Medicine

## 2014-01-18 ENCOUNTER — Telehealth: Payer: Self-pay | Admitting: Internal Medicine

## 2014-01-18 VITALS — BP 130/60 | HR 67 | Temp 97.7°F | Ht 71.0 in | Wt 226.8 lb

## 2014-01-18 DIAGNOSIS — Z23 Encounter for immunization: Secondary | ICD-10-CM

## 2014-01-18 DIAGNOSIS — E785 Hyperlipidemia, unspecified: Secondary | ICD-10-CM

## 2014-01-18 DIAGNOSIS — I10 Essential (primary) hypertension: Secondary | ICD-10-CM | POA: Diagnosis not present

## 2014-01-18 DIAGNOSIS — E871 Hypo-osmolality and hyponatremia: Secondary | ICD-10-CM | POA: Diagnosis not present

## 2014-01-18 DIAGNOSIS — D649 Anemia, unspecified: Secondary | ICD-10-CM

## 2014-01-18 DIAGNOSIS — E119 Type 2 diabetes mellitus without complications: Secondary | ICD-10-CM | POA: Diagnosis not present

## 2014-01-18 LAB — CBC WITH DIFFERENTIAL/PLATELET
Basophils Absolute: 0 10*3/uL (ref 0.0–0.1)
Basophils Relative: 0.7 % (ref 0.0–3.0)
Eosinophils Absolute: 0.2 10*3/uL (ref 0.0–0.7)
Eosinophils Relative: 2.7 % (ref 0.0–5.0)
HCT: 37.4 % — ABNORMAL LOW (ref 39.0–52.0)
Hemoglobin: 12.6 g/dL — ABNORMAL LOW (ref 13.0–17.0)
LYMPHS ABS: 1.5 10*3/uL (ref 0.7–4.0)
LYMPHS PCT: 25.5 % (ref 12.0–46.0)
MCHC: 33.8 g/dL (ref 30.0–36.0)
MCV: 91.7 fl (ref 78.0–100.0)
Monocytes Absolute: 0.5 10*3/uL (ref 0.1–1.0)
Monocytes Relative: 8.1 % (ref 3.0–12.0)
Neutro Abs: 3.8 10*3/uL (ref 1.4–7.7)
Neutrophils Relative %: 63 % (ref 43.0–77.0)
PLATELETS: 273 10*3/uL (ref 150.0–400.0)
RBC: 4.08 Mil/uL — ABNORMAL LOW (ref 4.22–5.81)
RDW: 12.9 % (ref 11.5–14.6)
WBC: 6.1 10*3/uL (ref 4.5–10.5)

## 2014-01-18 LAB — MICROALBUMIN / CREATININE URINE RATIO
Creatinine,U: 113.6 mg/dL
MICROALB UR: 0.3 mg/dL (ref 0.0–1.9)
MICROALB/CREAT RATIO: 0.3 mg/g (ref 0.0–30.0)

## 2014-01-18 LAB — HEPATIC FUNCTION PANEL
ALT: 24 U/L (ref 0–53)
AST: 23 U/L (ref 0–37)
Albumin: 3.8 g/dL (ref 3.5–5.2)
Alkaline Phosphatase: 35 U/L — ABNORMAL LOW (ref 39–117)
BILIRUBIN TOTAL: 0.5 mg/dL (ref 0.3–1.2)
Bilirubin, Direct: 0.1 mg/dL (ref 0.0–0.3)
Total Protein: 7.9 g/dL (ref 6.0–8.3)

## 2014-01-18 LAB — LIPID PANEL
Cholesterol: 148 mg/dL (ref 0–200)
HDL: 39.1 mg/dL (ref >39.00)
LDL Cholesterol: 85 mg/dL (ref 0–99)
TRIGLYCERIDES: 121 mg/dL (ref 0.0–149.0)
Total CHOL/HDL Ratio: 4
VLDL: 24.2 mg/dL (ref 0.0–40.0)

## 2014-01-18 LAB — BASIC METABOLIC PANEL
BUN: 21 mg/dL (ref 6–23)
CO2: 30 mEq/L (ref 19–32)
Calcium: 9.6 mg/dL (ref 8.4–10.5)
Chloride: 100 mEq/L (ref 96–112)
Creatinine, Ser: 0.9 mg/dL (ref 0.4–1.5)
GFR: 90.99 mL/min (ref >60.00)
Glucose, Bld: 143 mg/dL — ABNORMAL HIGH (ref 70–99)
POTASSIUM: 5 meq/L (ref 3.5–5.1)
Sodium: 136 mEq/L (ref 135–145)

## 2014-01-18 LAB — TSH: TSH: 2.52 u[IU]/mL (ref 0.35–5.50)

## 2014-01-18 LAB — HEMOGLOBIN A1C: HEMOGLOBIN A1C: 7.5 % — AB (ref 4.6–6.5)

## 2014-01-18 NOTE — Assessment & Plan Note (Signed)
Sugars as outlined.   Continue diet and exercise.  Follow.  Check metabolic panel and E3X today.

## 2014-01-18 NOTE — Assessment & Plan Note (Signed)
Has been slightly decreased but relatively stable.  Will recheck his sodium level today.

## 2014-01-18 NOTE — Telephone Encounter (Signed)
Pt notified of lab results via my chart.   Needs a non fasting lab appt in 2 weeks.  Please schedule and contact him with a lab appt date and time.  Thanks.

## 2014-01-18 NOTE — Assessment & Plan Note (Signed)
Low cholesterol diet.  Walks every morning.  Continue Lovastatin.  Follow lipid panel and liver function.  Check lipid profile today.

## 2014-01-18 NOTE — Progress Notes (Signed)
Pre-visit discussion using our clinic review tool. No additional management support is needed unless otherwise documented below in the visit note.  

## 2014-01-18 NOTE — Assessment & Plan Note (Signed)
Blood pressure as outlined.  Overall doing well.   Continue same medication regimen.  Follow.  Check metabolic panel.

## 2014-01-18 NOTE — Progress Notes (Signed)
Subjective:    Patient ID: JANSEN SCIUTO, male    DOB: 08/06/1926, 80 y.o.   MRN: [id number]  HPI 80 year old male with past history of diabetes, hypertension and hypercholesterolemia who comes in today for a scheduled follow up.  States he has been doing relatively well.  Walks every morning.  Overall he feels he is doing relatively well.  No chest pain or tightness with increased activity or exertion.  Breathing stable.  Bowels moving.  Regular on Colace.  Last a1c 7.4.  Has been gradually coming down.   Overall he feels he is doing well.  States am sugars averaging 130s.       Past Medical History  Diagnosis Date  . Diabetes mellitus   . HTN (hypertension)   . Hypercholesterolemia   . Urinary outflow obstruction     mild  . GERD (gastroesophageal reflux disease)   . BPH (benign prostatic hypertrophy)   . Skin cancer   . Degenerative arthritis   . Allergic rhinitis   . Iron deficiency anemia     Current Outpatient Prescriptions on File Prior to Visit  Medication Sig Dispense Refill  . ACCU-CHEK SOFTCLIX LANCETS lancets Use as instructed  100 each  12  . aspirin (ASPIR-LOW) 81 MG EC tablet Take 81 mg by mouth daily.        . calcium-vitamin D (OSCAL 500/200 D-3) 500-200 MG-UNIT per tablet Take 1 tablet by mouth 2 (two) times daily.       Marland Kitchen docusate sodium (COLACE) 100 MG capsule Take 100 mg by mouth 2 (two) times daily.      Marland Kitchen lovastatin (MEVACOR) 20 MG tablet Take 10 mg by mouth at bedtime.      . magnesium oxide (MAG-OX) 400 MG tablet Take 400 mg by mouth daily.      . naproxen sodium (ANAPROX) 220 MG tablet Take 220 mg by mouth as needed.      . NON FORMULARY B 12 injection monthly.      . valsartan-hydrochlorothiazide (DIOVAN HCT) 320-12.5 MG per tablet Take 1 tablet by mouth daily.  30 tablet  12   No current facility-administered medications on file prior to visit.    Review of Systems Patient denies any headache, lightheadedness or dizziness.  No significant  sinus or allergy symptoms.  No chest pain, tightness or palpitations.  No increased shortness of breath, cough or congestion.  No nausea or vomiting.  No acid reflux.  No abdominal pain or cramping.  No bowel change, such as diarrhea, constipation, BRBPR or melana.  No urine change.   Walking every morning.  Overall he feels he is doing well.       Objective:   Physical Exam  Filed Vitals:   01/18/14 0753  BP: 130/60  Pulse: 67  Temp: 97.7 F (36.5 C)   Blood pressure 128/68, pulse 43  80 year old male in no acute distress.  HEENT:  Nares - clear.  Oropharynx - without lesions. NECK:  Supple.  Nontender.  No audible carotid bruit.  HEART:  Appears to be regular.   LUNGS:  No crackles or wheezing audible.  Respirations even and unlabored.   RADIAL PULSE:  Equal bilaterally.  ABDOMEN:  Soft.  Nontender.  Bowel sounds present and normal.  No audible abdominal bruit.   EXTREMITIES:  No increased edema present.    FEET:  No lesions.            Assessment & Plan:  CARDIOVASCULAR.  Currently doing well.  Continue risk factor modification.  Has seen Dr Rockey Situ.    HEALTH MAINTENANCE.  Physical 05/18/13.   PSA 05/14/13 - .39.  Prevnar given today.

## 2014-01-21 NOTE — Telephone Encounter (Signed)
Appointment 4/2  Sent my chart message to pt

## 2014-02-04 ENCOUNTER — Other Ambulatory Visit (INDEPENDENT_AMBULATORY_CARE_PROVIDER_SITE_OTHER): Payer: Medicare Other

## 2014-02-04 ENCOUNTER — Other Ambulatory Visit: Payer: Self-pay | Admitting: Internal Medicine

## 2014-02-04 DIAGNOSIS — D649 Anemia, unspecified: Secondary | ICD-10-CM | POA: Diagnosis not present

## 2014-02-04 LAB — FERRITIN: Ferritin: 30.1 ng/mL (ref 22.0–322.0)

## 2014-02-04 LAB — IBC PANEL
IRON: 76 ug/dL (ref 42–165)
Saturation Ratios: 22.1 % (ref 20.0–50.0)
Transferrin: 245.5 mg/dL (ref 212.0–360.0)

## 2014-02-04 LAB — CBC WITH DIFFERENTIAL/PLATELET
BASOS ABS: 0.1 10*3/uL (ref 0.0–0.1)
Basophils Relative: 1.1 % (ref 0.0–3.0)
EOS ABS: 0.1 10*3/uL (ref 0.0–0.7)
EOS PCT: 2.1 % (ref 0.0–5.0)
HCT: 36.7 % — ABNORMAL LOW (ref 39.0–52.0)
Hemoglobin: 12.4 g/dL — ABNORMAL LOW (ref 13.0–17.0)
Lymphocytes Relative: 23.9 % (ref 12.0–46.0)
Lymphs Abs: 1.5 10*3/uL (ref 0.7–4.0)
MCHC: 33.7 g/dL (ref 30.0–36.0)
MCV: 91.5 fl (ref 78.0–100.0)
Monocytes Absolute: 0.5 10*3/uL (ref 0.1–1.0)
Monocytes Relative: 8.3 % (ref 3.0–12.0)
NEUTROS PCT: 64.6 % (ref 43.0–77.0)
Neutro Abs: 3.9 10*3/uL (ref 1.4–7.7)
Platelets: 250 10*3/uL (ref 150.0–400.0)
RBC: 4.01 Mil/uL — ABNORMAL LOW (ref 4.22–5.81)
RDW: 12.9 % (ref 11.5–14.6)
WBC: 6.1 10*3/uL (ref 4.5–10.5)

## 2014-02-05 ENCOUNTER — Encounter: Payer: Self-pay | Admitting: Internal Medicine

## 2014-02-05 ENCOUNTER — Telehealth: Payer: Self-pay | Admitting: Internal Medicine

## 2014-02-05 DIAGNOSIS — D649 Anemia, unspecified: Secondary | ICD-10-CM

## 2014-02-05 NOTE — Telephone Encounter (Signed)
Pt notiifed of lab results via my chart.  Needs a f/u non fasting lab appt in 4-6 weeks.  Please schedule and notify pt of appt date and time.  Thanks.    Dr Nicki Reaper

## 2014-02-08 DIAGNOSIS — M999 Biomechanical lesion, unspecified: Secondary | ICD-10-CM | POA: Diagnosis not present

## 2014-02-08 DIAGNOSIS — IMO0002 Reserved for concepts with insufficient information to code with codable children: Secondary | ICD-10-CM | POA: Diagnosis not present

## 2014-02-08 DIAGNOSIS — M5430 Sciatica, unspecified side: Secondary | ICD-10-CM | POA: Diagnosis not present

## 2014-02-08 NOTE — Telephone Encounter (Signed)
Appointment 5/6 sent my chart message letting pt know about appointment

## 2014-02-19 ENCOUNTER — Ambulatory Visit (INDEPENDENT_AMBULATORY_CARE_PROVIDER_SITE_OTHER): Payer: Medicare Other

## 2014-02-19 DIAGNOSIS — E538 Deficiency of other specified B group vitamins: Secondary | ICD-10-CM | POA: Diagnosis not present

## 2014-02-19 MED ORDER — CYANOCOBALAMIN 1000 MCG/ML IJ SOLN
1000.0000 ug | Freq: Once | INTRAMUSCULAR | Status: AC
  Administered 2014-02-19: 1000 ug via INTRAMUSCULAR

## 2014-03-08 DIAGNOSIS — M5430 Sciatica, unspecified side: Secondary | ICD-10-CM | POA: Diagnosis not present

## 2014-03-08 DIAGNOSIS — IMO0002 Reserved for concepts with insufficient information to code with codable children: Secondary | ICD-10-CM | POA: Diagnosis not present

## 2014-03-08 DIAGNOSIS — M999 Biomechanical lesion, unspecified: Secondary | ICD-10-CM | POA: Diagnosis not present

## 2014-03-09 ENCOUNTER — Other Ambulatory Visit (INDEPENDENT_AMBULATORY_CARE_PROVIDER_SITE_OTHER): Payer: Medicare Other

## 2014-03-09 DIAGNOSIS — D649 Anemia, unspecified: Secondary | ICD-10-CM

## 2014-03-09 LAB — CBC WITH DIFFERENTIAL/PLATELET
BASOS ABS: 0 10*3/uL (ref 0.0–0.1)
Basophils Relative: 0.5 % (ref 0.0–3.0)
Eosinophils Absolute: 0.1 10*3/uL (ref 0.0–0.7)
Eosinophils Relative: 2.1 % (ref 0.0–5.0)
HEMATOCRIT: 37.3 % — AB (ref 39.0–52.0)
Hemoglobin: 12.5 g/dL — ABNORMAL LOW (ref 13.0–17.0)
LYMPHS ABS: 1.9 10*3/uL (ref 0.7–4.0)
Lymphocytes Relative: 28.1 % (ref 12.0–46.0)
MCHC: 33.5 g/dL (ref 30.0–36.0)
MCV: 92.1 fl (ref 78.0–100.0)
Monocytes Absolute: 0.5 10*3/uL (ref 0.1–1.0)
Monocytes Relative: 7.8 % (ref 3.0–12.0)
NEUTROS ABS: 4.1 10*3/uL (ref 1.4–7.7)
Neutrophils Relative %: 61.5 % (ref 43.0–77.0)
PLATELETS: 250 10*3/uL (ref 150.0–400.0)
RBC: 4.04 Mil/uL — ABNORMAL LOW (ref 4.22–5.81)
RDW: 13.3 % (ref 11.5–15.5)
WBC: 6.7 10*3/uL (ref 4.0–10.5)

## 2014-03-10 ENCOUNTER — Encounter: Payer: Self-pay | Admitting: Internal Medicine

## 2014-03-10 ENCOUNTER — Other Ambulatory Visit: Payer: Medicare Other

## 2014-03-23 ENCOUNTER — Ambulatory Visit (INDEPENDENT_AMBULATORY_CARE_PROVIDER_SITE_OTHER): Payer: Medicare Other | Admitting: *Deleted

## 2014-03-23 DIAGNOSIS — E538 Deficiency of other specified B group vitamins: Secondary | ICD-10-CM

## 2014-03-23 MED ORDER — CYANOCOBALAMIN 1000 MCG/ML IJ SOLN
1000.0000 ug | Freq: Once | INTRAMUSCULAR | Status: AC
  Administered 2014-03-23: 1000 ug via INTRAMUSCULAR

## 2014-04-05 DIAGNOSIS — M999 Biomechanical lesion, unspecified: Secondary | ICD-10-CM | POA: Diagnosis not present

## 2014-04-05 DIAGNOSIS — IMO0002 Reserved for concepts with insufficient information to code with codable children: Secondary | ICD-10-CM | POA: Diagnosis not present

## 2014-04-05 DIAGNOSIS — M5430 Sciatica, unspecified side: Secondary | ICD-10-CM | POA: Diagnosis not present

## 2014-04-29 ENCOUNTER — Ambulatory Visit (INDEPENDENT_AMBULATORY_CARE_PROVIDER_SITE_OTHER): Payer: Medicare Other | Admitting: *Deleted

## 2014-04-29 DIAGNOSIS — E538 Deficiency of other specified B group vitamins: Secondary | ICD-10-CM | POA: Diagnosis not present

## 2014-04-29 DIAGNOSIS — L570 Actinic keratosis: Secondary | ICD-10-CM | POA: Diagnosis not present

## 2014-04-29 MED ORDER — CYANOCOBALAMIN 1000 MCG/ML IJ SOLN
1000.0000 ug | Freq: Once | INTRAMUSCULAR | Status: AC
  Administered 2014-04-29: 1000 ug via INTRAMUSCULAR

## 2014-05-03 DIAGNOSIS — M5430 Sciatica, unspecified side: Secondary | ICD-10-CM | POA: Diagnosis not present

## 2014-05-03 DIAGNOSIS — M999 Biomechanical lesion, unspecified: Secondary | ICD-10-CM | POA: Diagnosis not present

## 2014-05-03 DIAGNOSIS — IMO0002 Reserved for concepts with insufficient information to code with codable children: Secondary | ICD-10-CM | POA: Diagnosis not present

## 2014-05-20 ENCOUNTER — Ambulatory Visit: Payer: Medicare Other | Admitting: Internal Medicine

## 2014-05-24 ENCOUNTER — Encounter: Payer: Self-pay | Admitting: Internal Medicine

## 2014-05-24 ENCOUNTER — Ambulatory Visit (INDEPENDENT_AMBULATORY_CARE_PROVIDER_SITE_OTHER): Payer: Medicare Other | Admitting: Internal Medicine

## 2014-05-24 VITALS — BP 126/60 | HR 69 | Temp 97.9°F | Ht 71.0 in | Wt 231.5 lb

## 2014-05-24 DIAGNOSIS — I10 Essential (primary) hypertension: Secondary | ICD-10-CM | POA: Diagnosis not present

## 2014-05-24 DIAGNOSIS — E119 Type 2 diabetes mellitus without complications: Secondary | ICD-10-CM

## 2014-05-24 DIAGNOSIS — E785 Hyperlipidemia, unspecified: Secondary | ICD-10-CM

## 2014-05-24 DIAGNOSIS — D649 Anemia, unspecified: Secondary | ICD-10-CM

## 2014-05-24 DIAGNOSIS — E871 Hypo-osmolality and hyponatremia: Secondary | ICD-10-CM | POA: Diagnosis not present

## 2014-05-24 DIAGNOSIS — E669 Obesity, unspecified: Secondary | ICD-10-CM

## 2014-05-24 DIAGNOSIS — Z125 Encounter for screening for malignant neoplasm of prostate: Secondary | ICD-10-CM

## 2014-05-24 NOTE — Progress Notes (Signed)
Pre visit review using our clinic review tool, if applicable. No additional management support is needed unless otherwise documented below in the visit note. 

## 2014-05-29 ENCOUNTER — Encounter: Payer: Self-pay | Admitting: Internal Medicine

## 2014-05-29 DIAGNOSIS — Z6834 Body mass index (BMI) 34.0-34.9, adult: Secondary | ICD-10-CM | POA: Insufficient documentation

## 2014-05-29 NOTE — Assessment & Plan Note (Signed)
Continue exercise.  Diabetic diet.  Will follow.

## 2014-05-29 NOTE — Progress Notes (Signed)
Subjective:    Patient ID: [name], male    DOB: November 16, 1925, 80 y.o.   MRN: [id number]  HPI 80 year old male with past history of diabetes, hypertension and hypercholesterolemia who comes in today for a scheduled follow up.  States he has been doing relatively well.  Walks every morning.   No chest pain or tightness with increased activity or exertion.  Breathing stable.  Bowels moving.  No problems reported.  Overall he feels he is doing well.  States am sugars averaging 140s.       Past Medical History  Diagnosis Date  . Diabetes mellitus   . HTN (hypertension)   . Hypercholesterolemia   . Urinary outflow obstruction     mild  . GERD (gastroesophageal reflux disease)   . BPH (benign prostatic hypertrophy)   . Skin cancer   . Degenerative arthritis   . Allergic rhinitis   . Iron deficiency anemia     Current Outpatient Prescriptions on File Prior to Visit  Medication Sig Dispense Refill  . ACCU-CHEK SOFTCLIX LANCETS lancets Use as instructed  100 each  12  . aspirin (ASPIR-LOW) 81 MG EC tablet Take 81 mg by mouth daily.        . calcium-vitamin D (OSCAL 500/200 D-3) 500-200 MG-UNIT per tablet Take 1 tablet by mouth 2 (two) times daily.       Marland Kitchen docusate sodium (COLACE) 100 MG capsule Take 100 mg by mouth 2 (two) times daily.      Marland Kitchen glucose blood test strip Use once daily to check blood sugars Dx 250.00      . lovastatin (MEVACOR) 20 MG tablet TAKE ONE TABLET BY MOUTH EVERY NIGHT  30 tablet  5  . magnesium oxide (MAG-OX) 400 MG tablet Take 400 mg by mouth daily.      . naproxen sodium (ANAPROX) 220 MG tablet Take 220 mg by mouth as needed.      . NON FORMULARY B 12 injection monthly.      . valsartan-hydrochlorothiazide (DIOVAN HCT) 320-12.5 MG per tablet Take 1 tablet by mouth daily.  30 tablet  12   No current facility-administered medications on file prior to visit.    Review of Systems Patient denies any headache, lightheadedness or dizziness.  No significant  sinus or allergy symptoms.  No chest pain, tightness or palpitations.  No increased shortness of breath, cough or congestion.  No nausea or vomiting.  No acid reflux.  No abdominal pain or cramping.  No bowel change, such as diarrhea, constipation, BRBPR or melana.  No urine change.   Walking every morning.  Overall he feels he is doing well.       Objective:   Physical Exam  Filed Vitals:   05/24/14 0932  BP: 126/60  Pulse: 69  Temp: 97.9 F (36.6 C)   Blood pressure recheck:  60/41  80 year old male in no acute distress.  HEENT:  Nares - clear.  Oropharynx - without lesions. NECK:  Supple.  Nontender.  No audible carotid bruit.  HEART:  Appears to be regular.   LUNGS:  No crackles or wheezing audible.  Respirations even and unlabored.   RADIAL PULSE:  Equal bilaterally.  ABDOMEN:  Soft.  Nontender.  Bowel sounds present and normal.  No audible abdominal bruit.   EXTREMITIES:  No increased edema present.    FEET:  No lesions.            Assessment & Plan:  CARDIOVASCULAR.  Currently doing well.  Continue risk factor modification.  Has seen Dr Rockey Situ.    HEALTH MAINTENANCE.  Physical 05/18/13.   Schedule a physical for next visit.  PSA 05/14/13 - .39.   Check PSA with next labs.

## 2014-05-29 NOTE — Assessment & Plan Note (Signed)
Low cholesterol diet.  Walks every morning.  Continue Lovastatin.  Follow lipid panel and liver function.

## 2014-05-29 NOTE — Assessment & Plan Note (Signed)
Blood pressure as outlined.  Overall doing well.   Continue same medication regimen.  Follow.  Follow metabolic panel.

## 2014-05-29 NOTE — Assessment & Plan Note (Signed)
Has been slightly decreased but relatively stable.  Will recheck his sodium with next labs.

## 2014-05-29 NOTE — Assessment & Plan Note (Signed)
Sugars as outlined.   Continue diet and exercise.  Follow.  Follow metabolic panel and T9N today.

## 2014-05-31 DIAGNOSIS — IMO0002 Reserved for concepts with insufficient information to code with codable children: Secondary | ICD-10-CM | POA: Diagnosis not present

## 2014-05-31 DIAGNOSIS — M999 Biomechanical lesion, unspecified: Secondary | ICD-10-CM | POA: Diagnosis not present

## 2014-05-31 DIAGNOSIS — M5430 Sciatica, unspecified side: Secondary | ICD-10-CM | POA: Diagnosis not present

## 2014-06-02 ENCOUNTER — Ambulatory Visit (INDEPENDENT_AMBULATORY_CARE_PROVIDER_SITE_OTHER): Payer: Medicare Other | Admitting: *Deleted

## 2014-06-02 ENCOUNTER — Other Ambulatory Visit (INDEPENDENT_AMBULATORY_CARE_PROVIDER_SITE_OTHER): Payer: Medicare Other

## 2014-06-02 DIAGNOSIS — D649 Anemia, unspecified: Secondary | ICD-10-CM

## 2014-06-02 DIAGNOSIS — E119 Type 2 diabetes mellitus without complications: Secondary | ICD-10-CM

## 2014-06-02 DIAGNOSIS — E785 Hyperlipidemia, unspecified: Secondary | ICD-10-CM

## 2014-06-02 DIAGNOSIS — E538 Deficiency of other specified B group vitamins: Secondary | ICD-10-CM

## 2014-06-02 DIAGNOSIS — Z125 Encounter for screening for malignant neoplasm of prostate: Secondary | ICD-10-CM

## 2014-06-02 LAB — LIPID PANEL
CHOLESTEROL: 141 mg/dL (ref 0–200)
HDL: 30.2 mg/dL — ABNORMAL LOW (ref >39.00)
LDL CALC: 85 mg/dL (ref 0–99)
NonHDL: 110.8
Total CHOL/HDL Ratio: 5
Triglycerides: 129 mg/dL (ref 0.0–149.0)
VLDL: 25.8 mg/dL (ref 0.0–40.0)

## 2014-06-02 LAB — BASIC METABOLIC PANEL
BUN: 21 mg/dL (ref 6–23)
CALCIUM: 9.6 mg/dL (ref 8.4–10.5)
CO2: 30 mEq/L (ref 19–32)
Chloride: 103 mEq/L (ref 96–112)
Creatinine, Ser: 0.9 mg/dL (ref 0.4–1.5)
GFR: 84.03 mL/min (ref >60.00)
GLUCOSE: 148 mg/dL — AB (ref 70–99)
POTASSIUM: 5 meq/L (ref 3.5–5.1)
SODIUM: 139 meq/L (ref 135–145)

## 2014-06-02 LAB — CBC WITH DIFFERENTIAL/PLATELET
Basophils Absolute: 0 10*3/uL (ref 0.0–0.1)
Basophils Relative: 0.7 % (ref 0.0–3.0)
EOS PCT: 2.3 % (ref 0.0–5.0)
Eosinophils Absolute: 0.1 10*3/uL (ref 0.0–0.7)
HEMATOCRIT: 37.2 % — AB (ref 39.0–52.0)
Hemoglobin: 12.4 g/dL — ABNORMAL LOW (ref 13.0–17.0)
LYMPHS ABS: 1.6 10*3/uL (ref 0.7–4.0)
Lymphocytes Relative: 26.6 % (ref 12.0–46.0)
MCHC: 33.4 g/dL (ref 30.0–36.0)
MCV: 92.4 fl (ref 78.0–100.0)
Monocytes Absolute: 0.4 10*3/uL (ref 0.1–1.0)
Monocytes Relative: 6.6 % (ref 3.0–12.0)
Neutro Abs: 3.9 10*3/uL (ref 1.4–7.7)
Neutrophils Relative %: 63.8 % (ref 43.0–77.0)
PLATELETS: 238 10*3/uL (ref 150.0–400.0)
RBC: 4.03 Mil/uL — ABNORMAL LOW (ref 4.22–5.81)
RDW: 13.2 % (ref 11.5–15.5)
WBC: 6.2 10*3/uL (ref 4.0–10.5)

## 2014-06-02 LAB — HEPATIC FUNCTION PANEL
ALT: 23 U/L (ref 0–53)
AST: 23 U/L (ref 0–37)
Albumin: 3.5 g/dL (ref 3.5–5.2)
Alkaline Phosphatase: 34 U/L — ABNORMAL LOW (ref 39–117)
BILIRUBIN DIRECT: 0.2 mg/dL (ref 0.0–0.3)
BILIRUBIN TOTAL: 0.9 mg/dL (ref 0.2–1.2)
Total Protein: 7.7 g/dL (ref 6.0–8.3)

## 2014-06-02 LAB — FERRITIN: Ferritin: 25.6 ng/mL (ref 22.0–322.0)

## 2014-06-02 LAB — PSA, MEDICARE: PSA: 0.45 ng/ml (ref 0.10–4.00)

## 2014-06-02 LAB — HEMOGLOBIN A1C: Hgb A1c MFr Bld: 7.8 % — ABNORMAL HIGH (ref 4.6–6.5)

## 2014-06-02 MED ORDER — CYANOCOBALAMIN 1000 MCG/ML IJ SOLN
1000.0000 ug | Freq: Once | INTRAMUSCULAR | Status: AC
  Administered 2014-06-02: 1000 ug via INTRAMUSCULAR

## 2014-06-03 ENCOUNTER — Encounter: Payer: Self-pay | Admitting: Internal Medicine

## 2014-06-04 ENCOUNTER — Other Ambulatory Visit: Payer: Self-pay | Admitting: *Deleted

## 2014-06-04 MED ORDER — ACCU-CHEK SOFTCLIX LANCETS MISC: Status: DC

## 2014-06-05 ENCOUNTER — Encounter: Payer: Self-pay | Admitting: Internal Medicine

## 2014-06-07 NOTE — Telephone Encounter (Signed)
Unread mychart message mailed to patient 

## 2014-06-08 ENCOUNTER — Telehealth: Payer: Self-pay | Admitting: Internal Medicine

## 2014-06-08 NOTE — Telephone Encounter (Signed)
Pt called in and stated [facility] faxed over a dwo form to be filled out and stated would like to know if has been filled back out and sent back. Would like a call back.

## 2014-06-09 NOTE — Telephone Encounter (Signed)
Have you seen this form that he is taking about? Per your mychart response to him it was received. I have not seen it yet.

## 2014-06-09 NOTE — Telephone Encounter (Signed)
Yep, required a signature I believe, put in Scott's box

## 2014-06-10 ENCOUNTER — Encounter: Payer: Self-pay | Admitting: Internal Medicine

## 2014-06-10 NOTE — Telephone Encounter (Signed)
Form faxed & pt notified via mychart

## 2014-06-10 NOTE — Telephone Encounter (Signed)
Received fax again, completed and put in Scott's blue folder for signature.

## 2014-06-23 DIAGNOSIS — D481 Neoplasm of uncertain behavior of connective and other soft tissue: Secondary | ICD-10-CM | POA: Diagnosis not present

## 2014-06-23 DIAGNOSIS — D485 Neoplasm of uncertain behavior of skin: Secondary | ICD-10-CM | POA: Diagnosis not present

## 2014-06-23 DIAGNOSIS — C4490 Unspecified malignant neoplasm of skin, unspecified: Secondary | ICD-10-CM

## 2014-06-23 DIAGNOSIS — C4442 Squamous cell carcinoma of skin of scalp and neck: Secondary | ICD-10-CM | POA: Diagnosis not present

## 2014-06-23 HISTORY — DX: Unspecified malignant neoplasm of skin, unspecified: C44.90

## 2014-06-28 DIAGNOSIS — M5430 Sciatica, unspecified side: Secondary | ICD-10-CM | POA: Diagnosis not present

## 2014-06-28 DIAGNOSIS — M999 Biomechanical lesion, unspecified: Secondary | ICD-10-CM | POA: Diagnosis not present

## 2014-06-28 DIAGNOSIS — IMO0002 Reserved for concepts with insufficient information to code with codable children: Secondary | ICD-10-CM | POA: Diagnosis not present

## 2014-06-30 DIAGNOSIS — M5430 Sciatica, unspecified side: Secondary | ICD-10-CM | POA: Diagnosis not present

## 2014-06-30 DIAGNOSIS — IMO0002 Reserved for concepts with insufficient information to code with codable children: Secondary | ICD-10-CM | POA: Diagnosis not present

## 2014-06-30 DIAGNOSIS — M999 Biomechanical lesion, unspecified: Secondary | ICD-10-CM | POA: Diagnosis not present

## 2014-07-02 DIAGNOSIS — IMO0002 Reserved for concepts with insufficient information to code with codable children: Secondary | ICD-10-CM | POA: Diagnosis not present

## 2014-07-02 DIAGNOSIS — M5430 Sciatica, unspecified side: Secondary | ICD-10-CM | POA: Diagnosis not present

## 2014-07-02 DIAGNOSIS — M999 Biomechanical lesion, unspecified: Secondary | ICD-10-CM | POA: Diagnosis not present

## 2014-07-05 DIAGNOSIS — IMO0002 Reserved for concepts with insufficient information to code with codable children: Secondary | ICD-10-CM | POA: Diagnosis not present

## 2014-07-05 DIAGNOSIS — M999 Biomechanical lesion, unspecified: Secondary | ICD-10-CM | POA: Diagnosis not present

## 2014-07-05 DIAGNOSIS — M5430 Sciatica, unspecified side: Secondary | ICD-10-CM | POA: Diagnosis not present

## 2014-07-07 DIAGNOSIS — M999 Biomechanical lesion, unspecified: Secondary | ICD-10-CM | POA: Diagnosis not present

## 2014-07-07 DIAGNOSIS — IMO0002 Reserved for concepts with insufficient information to code with codable children: Secondary | ICD-10-CM | POA: Diagnosis not present

## 2014-07-07 DIAGNOSIS — M5430 Sciatica, unspecified side: Secondary | ICD-10-CM | POA: Diagnosis not present

## 2014-07-09 DIAGNOSIS — IMO0002 Reserved for concepts with insufficient information to code with codable children: Secondary | ICD-10-CM | POA: Diagnosis not present

## 2014-07-09 DIAGNOSIS — M5430 Sciatica, unspecified side: Secondary | ICD-10-CM | POA: Diagnosis not present

## 2014-07-09 DIAGNOSIS — M999 Biomechanical lesion, unspecified: Secondary | ICD-10-CM | POA: Diagnosis not present

## 2014-07-14 ENCOUNTER — Ambulatory Visit: Payer: Medicare Other | Admitting: Cardiovascular Disease

## 2014-07-14 DIAGNOSIS — IMO0002 Reserved for concepts with insufficient information to code with codable children: Secondary | ICD-10-CM | POA: Diagnosis not present

## 2014-07-14 DIAGNOSIS — M999 Biomechanical lesion, unspecified: Secondary | ICD-10-CM | POA: Diagnosis not present

## 2014-07-14 DIAGNOSIS — M5430 Sciatica, unspecified side: Secondary | ICD-10-CM | POA: Diagnosis not present

## 2014-07-16 DIAGNOSIS — M999 Biomechanical lesion, unspecified: Secondary | ICD-10-CM | POA: Diagnosis not present

## 2014-07-16 DIAGNOSIS — IMO0002 Reserved for concepts with insufficient information to code with codable children: Secondary | ICD-10-CM | POA: Diagnosis not present

## 2014-07-16 DIAGNOSIS — M5430 Sciatica, unspecified side: Secondary | ICD-10-CM | POA: Diagnosis not present

## 2014-07-19 DIAGNOSIS — H532 Diplopia: Secondary | ICD-10-CM | POA: Diagnosis not present

## 2014-07-20 DIAGNOSIS — IMO0002 Reserved for concepts with insufficient information to code with codable children: Secondary | ICD-10-CM | POA: Diagnosis not present

## 2014-07-20 DIAGNOSIS — M5430 Sciatica, unspecified side: Secondary | ICD-10-CM | POA: Diagnosis not present

## 2014-07-20 DIAGNOSIS — M999 Biomechanical lesion, unspecified: Secondary | ICD-10-CM | POA: Diagnosis not present

## 2014-07-21 ENCOUNTER — Encounter: Payer: Self-pay | Admitting: Cardiovascular Disease

## 2014-07-21 ENCOUNTER — Ambulatory Visit (INDEPENDENT_AMBULATORY_CARE_PROVIDER_SITE_OTHER): Payer: Medicare Other | Admitting: Cardiovascular Disease

## 2014-07-21 VITALS — BP 120/62 | HR 67 | Ht 72.0 in | Wt 231.5 lb

## 2014-07-21 DIAGNOSIS — E119 Type 2 diabetes mellitus without complications: Secondary | ICD-10-CM

## 2014-07-21 DIAGNOSIS — R002 Palpitations: Secondary | ICD-10-CM | POA: Diagnosis not present

## 2014-07-21 DIAGNOSIS — E785 Hyperlipidemia, unspecified: Secondary | ICD-10-CM | POA: Diagnosis not present

## 2014-07-21 DIAGNOSIS — I10 Essential (primary) hypertension: Secondary | ICD-10-CM | POA: Diagnosis not present

## 2014-07-21 DIAGNOSIS — E669 Obesity, unspecified: Secondary | ICD-10-CM

## 2014-07-21 NOTE — Assessment & Plan Note (Signed)
We discussed his diet with him. Recommended low carbohydrate foods

## 2014-07-21 NOTE — Assessment & Plan Note (Signed)
Blood pressure is well controlled on today's visit. No changes made to the medications. 

## 2014-07-21 NOTE — Assessment & Plan Note (Signed)
Cholesterol is at goal on the current lipid regimen. No changes to the medications were made.  

## 2014-07-21 NOTE — Progress Notes (Signed)
Patient ID: [name], male    DOB: Jun 26, 1926, 80 y.o.   MRN: [id number]  HPI Comments: [name] is a pleasant 80 yo male with a h/o HTN, diabetes, hemoglobin A1c >7, obesity , hyperlipidemia, pernicious anemia, history of palpitations  Presenting for routine followup.  He reports that he has been doing well. He reports that he is still very active, tries to exercise several days per week.  He walks in the mall with his wife who has Parkinson's Weight continues to be a major problem. Most recent hemoglobin A1c greater than 7. In general does not watch his diet  He denies any shortness of breath, edema, chest pain, palpitations. Is tolerating his medications well.   EKG shows NSR with  LAD, no significant ST or T wave changes Lab work July 2015 showing total cholesterol 141, LDL 85, hemoglobin A1c 7.8    Previous Holter monitor 3 years showed normal sinus rhythm per the notes    Outpatient Encounter Prescriptions as of 07/21/2014  Medication Sig  . ACCU-CHEK SOFTCLIX LANCETS lancets Use as instructed  . aspirin (ASPIR-LOW) 81 MG EC tablet Take 81 mg by mouth daily.    . calcium-vitamin D (OSCAL 500/200 D-3) 500-200 MG-UNIT per tablet Take 1 tablet by mouth 2 (two) times daily.   Marland Kitchen docusate sodium (COLACE) 100 MG capsule Take 100 mg by mouth 2 (two) times daily.  Marland Kitchen glucose blood test strip Use once daily to check blood sugars Dx 250.00  . lovastatin (MEVACOR) 20 MG tablet TAKE ONE TABLET BY MOUTH EVERY NIGHT  . magnesium oxide (MAG-OX) 400 MG tablet Take 400 mg by mouth daily.  . naproxen sodium (ANAPROX) 220 MG tablet Take 220 mg by mouth as needed.  . NON FORMULARY B 12 injection monthly.  . valsartan-hydrochlorothiazide (DIOVAN HCT) 320-12.5 MG per tablet Take 1 tablet by mouth daily.     Review of Systems  Constitutional: Negative.   HENT: Negative.   Eyes: Negative.   Respiratory: Negative.   Cardiovascular: Negative.   Gastrointestinal: Negative.    Endocrine: Negative.   Musculoskeletal: Negative.   Skin: Negative.   Allergic/Immunologic: Negative.   Neurological: Negative.   Hematological: Negative.   Psychiatric/Behavioral: Negative.   All other systems reviewed and are negative.   BP 120/62  Pulse 67  Ht 6' (1.829 m)  Wt 231 lb 8 oz (105.008 kg)  BMI 31.39 kg/m2  Physical Exam  Nursing note and vitals reviewed. Constitutional: He is oriented to person, place, and time. He appears well-developed and well-nourished.  Obese  HENT:  Head: Normocephalic.  Nose: Nose normal.  Mouth/Throat: Oropharynx is clear and moist.  Eyes: Conjunctivae are normal. Pupils are equal, round, and reactive to light.  Neck: Normal range of motion. Neck supple. No JVD present.  Cardiovascular: Normal rate, regular rhythm, S1 normal, S2 normal, normal heart sounds and intact distal pulses.  Exam reveals no gallop and no friction rub.   No murmur heard. Pulmonary/Chest: Effort normal and breath sounds normal. No respiratory distress. He has no wheezes. He has no rales. He exhibits no tenderness.  Abdominal: Soft. Bowel sounds are normal. He exhibits no distension. There is no tenderness.  Musculoskeletal: Normal range of motion. He exhibits no edema and no tenderness.  Lymphadenopathy:    He has no cervical adenopathy.  Neurological: He is alert and oriented to person, place, and time. Coordination normal.  Skin: Skin is warm and dry. No rash noted. No erythema.  Psychiatric: He has  a normal mood and affect. His behavior is normal. Judgment and thought content normal.      Assessment and Plan

## 2014-07-21 NOTE — Assessment & Plan Note (Signed)
We have encouraged continued exercise, careful diet management in an effort to lose weight. 

## 2014-07-21 NOTE — Patient Instructions (Signed)
You are doing well. No medication changes were made.  Please call us if you have new issues that need to be addressed before your next appt.  Your physician wants you to follow-up in: 12 months.  You will receive a reminder letter in the mail two months in advance. If you don't receive a letter, please call our office to schedule the follow-up appointment. 

## 2014-07-22 DIAGNOSIS — IMO0002 Reserved for concepts with insufficient information to code with codable children: Secondary | ICD-10-CM | POA: Diagnosis not present

## 2014-07-22 DIAGNOSIS — M5430 Sciatica, unspecified side: Secondary | ICD-10-CM | POA: Diagnosis not present

## 2014-07-22 DIAGNOSIS — M999 Biomechanical lesion, unspecified: Secondary | ICD-10-CM | POA: Diagnosis not present

## 2014-07-27 DIAGNOSIS — C4442 Squamous cell carcinoma of skin of scalp and neck: Secondary | ICD-10-CM | POA: Diagnosis not present

## 2014-07-27 DIAGNOSIS — M5430 Sciatica, unspecified side: Secondary | ICD-10-CM | POA: Diagnosis not present

## 2014-07-27 DIAGNOSIS — IMO0002 Reserved for concepts with insufficient information to code with codable children: Secondary | ICD-10-CM | POA: Diagnosis not present

## 2014-07-27 DIAGNOSIS — M999 Biomechanical lesion, unspecified: Secondary | ICD-10-CM | POA: Diagnosis not present

## 2014-07-27 DIAGNOSIS — D485 Neoplasm of uncertain behavior of skin: Secondary | ICD-10-CM | POA: Diagnosis not present

## 2014-08-05 ENCOUNTER — Ambulatory Visit (INDEPENDENT_AMBULATORY_CARE_PROVIDER_SITE_OTHER): Payer: Medicare Other | Admitting: *Deleted

## 2014-08-05 DIAGNOSIS — E538 Deficiency of other specified B group vitamins: Secondary | ICD-10-CM

## 2014-08-05 MED ORDER — CYANOCOBALAMIN 1000 MCG/ML IJ SOLN
1000.0000 ug | Freq: Once | INTRAMUSCULAR | Status: AC
  Administered 2014-08-05: 1000 ug via INTRAMUSCULAR

## 2014-08-09 ENCOUNTER — Ambulatory Visit (INDEPENDENT_AMBULATORY_CARE_PROVIDER_SITE_OTHER): Payer: Medicare Other

## 2014-08-09 DIAGNOSIS — Z23 Encounter for immunization: Secondary | ICD-10-CM | POA: Diagnosis not present

## 2014-08-16 DIAGNOSIS — M5136 Other intervertebral disc degeneration, lumbar region: Secondary | ICD-10-CM | POA: Diagnosis not present

## 2014-08-16 DIAGNOSIS — M9903 Segmental and somatic dysfunction of lumbar region: Secondary | ICD-10-CM | POA: Diagnosis not present

## 2014-08-16 DIAGNOSIS — M9905 Segmental and somatic dysfunction of pelvic region: Secondary | ICD-10-CM | POA: Diagnosis not present

## 2014-08-16 DIAGNOSIS — M955 Acquired deformity of pelvis: Secondary | ICD-10-CM | POA: Diagnosis not present

## 2014-09-09 ENCOUNTER — Ambulatory Visit (INDEPENDENT_AMBULATORY_CARE_PROVIDER_SITE_OTHER): Payer: Medicare Other | Admitting: *Deleted

## 2014-09-09 DIAGNOSIS — E538 Deficiency of other specified B group vitamins: Secondary | ICD-10-CM | POA: Diagnosis not present

## 2014-09-09 MED ORDER — CYANOCOBALAMIN 1000 MCG/ML IJ SOLN
1000.0000 ug | Freq: Once | INTRAMUSCULAR | Status: AC
  Administered 2014-09-09: 1000 ug via INTRAMUSCULAR

## 2014-09-13 DIAGNOSIS — M955 Acquired deformity of pelvis: Secondary | ICD-10-CM | POA: Diagnosis not present

## 2014-09-13 DIAGNOSIS — M5136 Other intervertebral disc degeneration, lumbar region: Secondary | ICD-10-CM | POA: Diagnosis not present

## 2014-09-13 DIAGNOSIS — M9905 Segmental and somatic dysfunction of pelvic region: Secondary | ICD-10-CM | POA: Diagnosis not present

## 2014-09-13 DIAGNOSIS — M9903 Segmental and somatic dysfunction of lumbar region: Secondary | ICD-10-CM | POA: Diagnosis not present

## 2014-09-21 ENCOUNTER — Telehealth: Payer: Self-pay | Admitting: *Deleted

## 2014-09-21 DIAGNOSIS — E871 Hypo-osmolality and hyponatremia: Secondary | ICD-10-CM

## 2014-09-21 DIAGNOSIS — E119 Type 2 diabetes mellitus without complications: Secondary | ICD-10-CM

## 2014-09-21 DIAGNOSIS — E7800 Pure hypercholesterolemia, unspecified: Secondary | ICD-10-CM

## 2014-09-21 DIAGNOSIS — I10 Essential (primary) hypertension: Secondary | ICD-10-CM

## 2014-09-21 DIAGNOSIS — D649 Anemia, unspecified: Secondary | ICD-10-CM

## 2014-09-21 NOTE — Telephone Encounter (Signed)
Pt is coming in tomorrow what labs and dx?  

## 2014-09-21 NOTE — Telephone Encounter (Signed)
Orders placed for labs

## 2014-09-22 ENCOUNTER — Other Ambulatory Visit (INDEPENDENT_AMBULATORY_CARE_PROVIDER_SITE_OTHER): Payer: Medicare Other

## 2014-09-22 DIAGNOSIS — D649 Anemia, unspecified: Secondary | ICD-10-CM

## 2014-09-22 DIAGNOSIS — E7800 Pure hypercholesterolemia, unspecified: Secondary | ICD-10-CM

## 2014-09-22 DIAGNOSIS — E119 Type 2 diabetes mellitus without complications: Secondary | ICD-10-CM | POA: Diagnosis not present

## 2014-09-22 LAB — CBC WITH DIFFERENTIAL/PLATELET
BASOS ABS: 0.1 10*3/uL (ref 0.0–0.1)
Basophils Relative: 1 % (ref 0.0–3.0)
EOS PCT: 2.7 % (ref 0.0–5.0)
Eosinophils Absolute: 0.1 10*3/uL (ref 0.0–0.7)
HEMATOCRIT: 37.8 % — AB (ref 39.0–52.0)
Hemoglobin: 12.8 g/dL — ABNORMAL LOW (ref 13.0–17.0)
LYMPHS ABS: 1.5 10*3/uL (ref 0.7–4.0)
Lymphocytes Relative: 27.7 % (ref 12.0–46.0)
MCHC: 33.9 g/dL (ref 30.0–36.0)
MCV: 90.2 fl (ref 78.0–100.0)
Monocytes Absolute: 0.4 10*3/uL (ref 0.1–1.0)
Monocytes Relative: 8.1 % (ref 3.0–12.0)
NEUTROS PCT: 60.5 % (ref 43.0–77.0)
Neutro Abs: 3.2 10*3/uL (ref 1.4–7.7)
PLATELETS: 251 10*3/uL (ref 150.0–400.0)
RBC: 4.19 Mil/uL — ABNORMAL LOW (ref 4.22–5.81)
RDW: 13.3 % (ref 11.5–15.5)
WBC: 5.3 10*3/uL (ref 4.0–10.5)

## 2014-09-22 LAB — BASIC METABOLIC PANEL WITH GFR
BUN: 22 mg/dL (ref 6–23)
CO2: 29 meq/L (ref 19–32)
Calcium: 9.5 mg/dL (ref 8.4–10.5)
Chloride: 103 meq/L (ref 96–112)
Creatinine, Ser: 0.9 mg/dL (ref 0.4–1.5)
GFR: 87.28 mL/min
Glucose, Bld: 140 mg/dL — ABNORMAL HIGH (ref 70–99)
Potassium: 5.5 meq/L — ABNORMAL HIGH (ref 3.5–5.1)
Sodium: 141 meq/L (ref 135–145)

## 2014-09-22 LAB — LIPID PANEL
CHOLESTEROL: 152 mg/dL (ref 0–200)
HDL: 45.3 mg/dL (ref >39.00)
LDL Cholesterol: 88 mg/dL (ref 0–99)
NonHDL: 106.7
TRIGLYCERIDES: 92 mg/dL (ref 0.0–149.0)
Total CHOL/HDL Ratio: 3
VLDL: 18.4 mg/dL (ref 0.0–40.0)

## 2014-09-22 LAB — HEPATIC FUNCTION PANEL
ALT: 26 U/L (ref 0–53)
AST: 24 U/L (ref 0–37)
Albumin: 3.9 g/dL (ref 3.5–5.2)
Alkaline Phosphatase: 31 U/L — ABNORMAL LOW (ref 39–117)
Bilirubin, Direct: 0.1 mg/dL (ref 0.0–0.3)
Total Bilirubin: 0.7 mg/dL (ref 0.2–1.2)
Total Protein: 7.9 g/dL (ref 6.0–8.3)

## 2014-09-22 LAB — FERRITIN: Ferritin: 26.4 ng/mL (ref 22.0–322.0)

## 2014-09-22 LAB — HEMOGLOBIN A1C: Hgb A1c MFr Bld: 7.6 % — ABNORMAL HIGH (ref 4.6–6.5)

## 2014-09-23 ENCOUNTER — Telehealth: Payer: Self-pay | Admitting: *Deleted

## 2014-09-23 ENCOUNTER — Other Ambulatory Visit (INDEPENDENT_AMBULATORY_CARE_PROVIDER_SITE_OTHER): Payer: Medicare Other

## 2014-09-23 DIAGNOSIS — E875 Hyperkalemia: Secondary | ICD-10-CM

## 2014-09-23 LAB — POTASSIUM: POTASSIUM: 5.1 meq/L (ref 3.5–5.1)

## 2014-09-23 NOTE — Telephone Encounter (Signed)
What labs and dx?  

## 2014-09-23 NOTE — Telephone Encounter (Signed)
F/u potassium ordered.

## 2014-09-24 ENCOUNTER — Encounter: Payer: Self-pay | Admitting: Internal Medicine

## 2014-09-24 ENCOUNTER — Other Ambulatory Visit: Payer: BLUE CROSS/BLUE SHIELD

## 2014-09-27 ENCOUNTER — Ambulatory Visit (INDEPENDENT_AMBULATORY_CARE_PROVIDER_SITE_OTHER): Payer: Medicare Other | Admitting: Internal Medicine

## 2014-09-27 ENCOUNTER — Encounter: Payer: Self-pay | Admitting: Internal Medicine

## 2014-09-27 VITALS — BP 154/72 | HR 83 | Temp 97.6°F | Ht 70.0 in | Wt 233.5 lb

## 2014-09-27 DIAGNOSIS — E871 Hypo-osmolality and hyponatremia: Secondary | ICD-10-CM | POA: Diagnosis not present

## 2014-09-27 DIAGNOSIS — E669 Obesity, unspecified: Secondary | ICD-10-CM

## 2014-09-27 DIAGNOSIS — E7800 Pure hypercholesterolemia, unspecified: Secondary | ICD-10-CM

## 2014-09-27 DIAGNOSIS — I10 Essential (primary) hypertension: Secondary | ICD-10-CM | POA: Diagnosis not present

## 2014-09-27 DIAGNOSIS — Z85828 Personal history of other malignant neoplasm of skin: Secondary | ICD-10-CM | POA: Diagnosis not present

## 2014-09-27 DIAGNOSIS — D649 Anemia, unspecified: Secondary | ICD-10-CM

## 2014-09-27 DIAGNOSIS — E119 Type 2 diabetes mellitus without complications: Secondary | ICD-10-CM | POA: Diagnosis not present

## 2014-09-27 DIAGNOSIS — E538 Deficiency of other specified B group vitamins: Secondary | ICD-10-CM | POA: Diagnosis not present

## 2014-09-27 DIAGNOSIS — L570 Actinic keratosis: Secondary | ICD-10-CM | POA: Diagnosis not present

## 2014-09-27 DIAGNOSIS — Z1283 Encounter for screening for malignant neoplasm of skin: Secondary | ICD-10-CM | POA: Diagnosis not present

## 2014-09-27 DIAGNOSIS — L578 Other skin changes due to chronic exposure to nonionizing radiation: Secondary | ICD-10-CM | POA: Diagnosis not present

## 2014-09-27 LAB — HM DIABETES FOOT EXAM

## 2014-09-27 NOTE — Progress Notes (Signed)
Pre visit review using our clinic review tool, if applicable. No additional management support is needed unless otherwise documented below in the visit note. 

## 2014-09-27 NOTE — Progress Notes (Signed)
Subjective:    Patient ID: [name], male    DOB: 15-May-1926, 80 y.o.   MRN: [id number]  HPI 80 year old male with past history of diabetes, hypertension and hypercholesterolemia who comes in today to follow up on these issues as well as for a complete physical exam.   States he has been doing relatively well.  Walks every morning.  Also riding his bike.  No chest pain or tightness with increased activity or exertion.  Breathing stable.  Bowels moving.  No problems reported.  Overall he feels he is doing well.  States sugars are stable.  Brought in no recorded readings.       Past Medical History  Diagnosis Date  . Diabetes mellitus   . HTN (hypertension)   . Hypercholesterolemia   . Urinary outflow obstruction     mild  . GERD (gastroesophageal reflux disease)   . BPH (benign prostatic hypertrophy)   . Skin cancer   . Degenerative arthritis   . Allergic rhinitis   . Iron deficiency anemia     Current Outpatient Prescriptions on File Prior to Visit  Medication Sig Dispense Refill  . ACCU-CHEK SOFTCLIX LANCETS lancets Use as instructed 100 each 12  . aspirin (ASPIR-LOW) 81 MG EC tablet Take 81 mg by mouth daily.      . calcium-vitamin D (OSCAL 500/200 D-3) 500-200 MG-UNIT per tablet Take 1 tablet by mouth 2 (two) times daily.     Marland Kitchen docusate sodium (COLACE) 100 MG capsule Take 100 mg by mouth 2 (two) times daily.    Marland Kitchen glucose blood test strip Use once daily to check blood sugars Dx 250.00    . lovastatin (MEVACOR) 20 MG tablet TAKE ONE TABLET BY MOUTH EVERY NIGHT 30 tablet 5  . magnesium oxide (MAG-OX) 400 MG tablet Take 400 mg by mouth daily.    . naproxen sodium (ANAPROX) 220 MG tablet Take 220 mg by mouth as needed.    . NON FORMULARY B 12 injection monthly.    . valsartan-hydrochlorothiazide (DIOVAN HCT) 320-12.5 MG per tablet Take 1 tablet by mouth daily. 30 tablet 12   No current facility-administered medications on file prior to visit.    Review of  Systems Patient denies any headache, lightheadedness or dizziness.  No  sinus or allergy symptoms.  No chest pain, tightness or palpitations.  No increased shortness of breath, cough or congestion.  No nausea or vomiting.  No acid reflux.  No abdominal pain or cramping.  No bowel change, such as diarrhea, constipation, BRBPR or melana.  No urine change.   Walking every morning.  Riding his stationary bike.  Overall he feels he is doing well.  No leg cramping with walking.       Objective:   Physical Exam  Filed Vitals:   09/27/14 0929  BP: 154/72  Pulse: 83  Temp: 97.6 F (36.4 C)   Blood pressure recheck:  140/64, pulse 33  80 year old male in no acute distress.  HEENT:  Nares - clear.  Oropharynx - without lesions. NECK:  Supple.  Nontender.  No audible carotid bruit.  HEART:  Appears to be regular.   LUNGS:  No crackles or wheezing audible.  Respirations even and unlabored.   RADIAL PULSE:  Equal bilaterally.  ABDOMEN:  Soft.  Nontender.  Bowel sounds present and normal.  No audible abdominal bruit.  GU:  Normal descended testicles.  No palpable testicular nodules.   RECTAL:  Could not appreciate any  palpable prostate nodules.  Heme negative.   EXTREMITIES:  No increased edema present.  DP pulses palpable right, diminished on the left.    FEET:  No lesions.            Assessment & Plan:  1. Obesity (BMI 30-39.9) Discussed continued diet and exercise.  Monitor carbs.   2. HYPERTENSION, BENIGN Blood pressure under reasonable control.  Follow.  Follow met b  3. Type 2 diabetes mellitus without complication Sugars overall stable.  a1c slightly improved.   Lab Results  Component Value Date   HGBA1C 7.6* 09/22/2014   4. Hypercholesterolemia Low cholesterol diet and exercise.  Continue lovastatin.   Lab Results  Component Value Date   CHOL 152 09/22/2014   HDL 45.30 09/22/2014   LDLCALC 88 09/22/2014   TRIG 92.0 09/22/2014   CHOLHDL 3 09/22/2014   5.  Hyponatremia Sodium on most recent check wnl.   Sodium 141.   6. Anemia, unspecified anemia type hgb stable at 12.8.  Continue B12 injections.    7.  HYPERKALEMIA.  Most recent recheck stable 5.1.    8.  CARDIOVASCULAR.  Currently doing well.  Continue risk factor modification.  Has seen Dr Rockey Situ.    HEALTH MAINTENANCE.  Physical today.   PSA 06/02/14 - .45.

## 2014-10-07 ENCOUNTER — Ambulatory Visit (INDEPENDENT_AMBULATORY_CARE_PROVIDER_SITE_OTHER): Payer: Medicare Other

## 2014-10-07 DIAGNOSIS — E538 Deficiency of other specified B group vitamins: Secondary | ICD-10-CM | POA: Diagnosis not present

## 2014-10-07 MED ORDER — CYANOCOBALAMIN 1000 MCG/ML IJ SOLN
1000.0000 ug | Freq: Once | INTRAMUSCULAR | Status: AC
  Administered 2014-10-07: 1000 ug via INTRAMUSCULAR

## 2014-10-11 DIAGNOSIS — M5136 Other intervertebral disc degeneration, lumbar region: Secondary | ICD-10-CM | POA: Diagnosis not present

## 2014-10-11 DIAGNOSIS — M9903 Segmental and somatic dysfunction of lumbar region: Secondary | ICD-10-CM | POA: Diagnosis not present

## 2014-10-11 DIAGNOSIS — M9905 Segmental and somatic dysfunction of pelvic region: Secondary | ICD-10-CM | POA: Diagnosis not present

## 2014-10-11 DIAGNOSIS — M955 Acquired deformity of pelvis: Secondary | ICD-10-CM | POA: Diagnosis not present

## 2014-10-13 ENCOUNTER — Other Ambulatory Visit: Payer: Self-pay | Admitting: Internal Medicine

## 2014-10-18 DIAGNOSIS — H2513 Age-related nuclear cataract, bilateral: Secondary | ICD-10-CM | POA: Diagnosis not present

## 2014-10-18 LAB — HM DIABETES EYE EXAM

## 2014-10-20 ENCOUNTER — Other Ambulatory Visit: Payer: Self-pay | Admitting: *Deleted

## 2014-10-20 MED ORDER — GLUCOSE BLOOD VI STRP: ORAL_STRIP | Status: DC

## 2014-10-28 ENCOUNTER — Encounter: Payer: Self-pay | Admitting: Internal Medicine

## 2014-11-08 DIAGNOSIS — M9903 Segmental and somatic dysfunction of lumbar region: Secondary | ICD-10-CM | POA: Diagnosis not present

## 2014-11-08 DIAGNOSIS — M9905 Segmental and somatic dysfunction of pelvic region: Secondary | ICD-10-CM | POA: Diagnosis not present

## 2014-11-08 DIAGNOSIS — M5136 Other intervertebral disc degeneration, lumbar region: Secondary | ICD-10-CM | POA: Diagnosis not present

## 2014-11-08 DIAGNOSIS — M955 Acquired deformity of pelvis: Secondary | ICD-10-CM | POA: Diagnosis not present

## 2014-11-10 ENCOUNTER — Ambulatory Visit (INDEPENDENT_AMBULATORY_CARE_PROVIDER_SITE_OTHER): Payer: Medicare Other

## 2014-11-10 DIAGNOSIS — E538 Deficiency of other specified B group vitamins: Secondary | ICD-10-CM | POA: Diagnosis not present

## 2014-11-10 MED ORDER — CYANOCOBALAMIN 1000 MCG/ML IJ SOLN
1000.0000 ug | Freq: Once | INTRAMUSCULAR | Status: AC
  Administered 2014-11-10: 1000 ug via INTRAMUSCULAR

## 2014-11-11 ENCOUNTER — Other Ambulatory Visit: Payer: Self-pay

## 2014-11-11 MED ORDER — VALSARTAN-HYDROCHLOROTHIAZIDE 320-12.5 MG PO TABS: ORAL_TABLET | ORAL | Status: DC

## 2014-11-11 MED ORDER — LOVASTATIN 20 MG PO TABS: 20.0000 mg | ORAL_TABLET | Freq: Every day | ORAL | Status: DC

## 2014-11-16 ENCOUNTER — Other Ambulatory Visit: Payer: Self-pay | Admitting: Internal Medicine

## 2014-11-16 DIAGNOSIS — E538 Deficiency of other specified B group vitamins: Secondary | ICD-10-CM | POA: Insufficient documentation

## 2014-11-16 NOTE — Progress Notes (Signed)
Has a known history of B12 deficiency.  Added to problem list.

## 2014-11-17 DIAGNOSIS — H2512 Age-related nuclear cataract, left eye: Secondary | ICD-10-CM | POA: Diagnosis not present

## 2014-12-06 DIAGNOSIS — M9905 Segmental and somatic dysfunction of pelvic region: Secondary | ICD-10-CM | POA: Diagnosis not present

## 2014-12-06 DIAGNOSIS — M955 Acquired deformity of pelvis: Secondary | ICD-10-CM | POA: Diagnosis not present

## 2014-12-06 DIAGNOSIS — M5136 Other intervertebral disc degeneration, lumbar region: Secondary | ICD-10-CM | POA: Diagnosis not present

## 2014-12-06 DIAGNOSIS — M9903 Segmental and somatic dysfunction of lumbar region: Secondary | ICD-10-CM | POA: Diagnosis not present

## 2014-12-21 ENCOUNTER — Ambulatory Visit: Payer: Self-pay | Admitting: Ophthalmology

## 2014-12-21 DIAGNOSIS — H5212 Myopia, left eye: Secondary | ICD-10-CM | POA: Diagnosis not present

## 2014-12-21 DIAGNOSIS — H2512 Age-related nuclear cataract, left eye: Secondary | ICD-10-CM | POA: Diagnosis not present

## 2014-12-21 DIAGNOSIS — Z01812 Encounter for preprocedural laboratory examination: Secondary | ICD-10-CM | POA: Diagnosis not present

## 2014-12-27 DIAGNOSIS — M5136 Other intervertebral disc degeneration, lumbar region: Secondary | ICD-10-CM | POA: Diagnosis not present

## 2014-12-27 DIAGNOSIS — M955 Acquired deformity of pelvis: Secondary | ICD-10-CM | POA: Diagnosis not present

## 2014-12-27 DIAGNOSIS — M9903 Segmental and somatic dysfunction of lumbar region: Secondary | ICD-10-CM | POA: Diagnosis not present

## 2014-12-27 DIAGNOSIS — M9905 Segmental and somatic dysfunction of pelvic region: Secondary | ICD-10-CM | POA: Diagnosis not present

## 2014-12-28 ENCOUNTER — Ambulatory Visit: Payer: Self-pay | Admitting: Ophthalmology

## 2014-12-28 DIAGNOSIS — Z7982 Long term (current) use of aspirin: Secondary | ICD-10-CM | POA: Diagnosis not present

## 2014-12-28 DIAGNOSIS — Z791 Long term (current) use of non-steroidal anti-inflammatories (NSAID): Secondary | ICD-10-CM | POA: Diagnosis not present

## 2014-12-28 DIAGNOSIS — M1990 Unspecified osteoarthritis, unspecified site: Secondary | ICD-10-CM | POA: Diagnosis not present

## 2014-12-28 DIAGNOSIS — E119 Type 2 diabetes mellitus without complications: Secondary | ICD-10-CM | POA: Diagnosis not present

## 2014-12-28 DIAGNOSIS — Z79899 Other long term (current) drug therapy: Secondary | ICD-10-CM | POA: Diagnosis not present

## 2014-12-28 DIAGNOSIS — Z85828 Personal history of other malignant neoplasm of skin: Secondary | ICD-10-CM | POA: Diagnosis not present

## 2014-12-28 DIAGNOSIS — H2512 Age-related nuclear cataract, left eye: Secondary | ICD-10-CM | POA: Diagnosis not present

## 2014-12-28 DIAGNOSIS — I10 Essential (primary) hypertension: Secondary | ICD-10-CM | POA: Diagnosis not present

## 2014-12-28 DIAGNOSIS — E780 Pure hypercholesterolemia: Secondary | ICD-10-CM | POA: Diagnosis not present

## 2015-01-05 DIAGNOSIS — H2511 Age-related nuclear cataract, right eye: Secondary | ICD-10-CM | POA: Diagnosis not present

## 2015-01-11 ENCOUNTER — Ambulatory Visit: Payer: Self-pay | Admitting: Ophthalmology

## 2015-01-11 DIAGNOSIS — M1990 Unspecified osteoarthritis, unspecified site: Secondary | ICD-10-CM | POA: Diagnosis not present

## 2015-01-11 DIAGNOSIS — E119 Type 2 diabetes mellitus without complications: Secondary | ICD-10-CM | POA: Diagnosis not present

## 2015-01-11 DIAGNOSIS — H2511 Age-related nuclear cataract, right eye: Secondary | ICD-10-CM | POA: Diagnosis not present

## 2015-01-11 DIAGNOSIS — E780 Pure hypercholesterolemia: Secondary | ICD-10-CM | POA: Diagnosis not present

## 2015-01-11 DIAGNOSIS — Z7982 Long term (current) use of aspirin: Secondary | ICD-10-CM | POA: Diagnosis not present

## 2015-01-11 DIAGNOSIS — H5211 Myopia, right eye: Secondary | ICD-10-CM | POA: Diagnosis not present

## 2015-01-11 DIAGNOSIS — Z79899 Other long term (current) drug therapy: Secondary | ICD-10-CM | POA: Diagnosis not present

## 2015-01-11 DIAGNOSIS — I10 Essential (primary) hypertension: Secondary | ICD-10-CM | POA: Diagnosis not present

## 2015-01-11 DIAGNOSIS — Z85828 Personal history of other malignant neoplasm of skin: Secondary | ICD-10-CM | POA: Diagnosis not present

## 2015-01-19 ENCOUNTER — Other Ambulatory Visit (INDEPENDENT_AMBULATORY_CARE_PROVIDER_SITE_OTHER): Payer: Medicare Other

## 2015-01-19 ENCOUNTER — Ambulatory Visit (INDEPENDENT_AMBULATORY_CARE_PROVIDER_SITE_OTHER): Payer: Medicare Other | Admitting: *Deleted

## 2015-01-19 ENCOUNTER — Encounter: Payer: Self-pay | Admitting: Internal Medicine

## 2015-01-19 DIAGNOSIS — E538 Deficiency of other specified B group vitamins: Secondary | ICD-10-CM | POA: Diagnosis not present

## 2015-01-19 DIAGNOSIS — E7800 Pure hypercholesterolemia, unspecified: Secondary | ICD-10-CM

## 2015-01-19 DIAGNOSIS — D649 Anemia, unspecified: Secondary | ICD-10-CM | POA: Diagnosis not present

## 2015-01-19 DIAGNOSIS — E119 Type 2 diabetes mellitus without complications: Secondary | ICD-10-CM | POA: Diagnosis not present

## 2015-01-19 DIAGNOSIS — E871 Hypo-osmolality and hyponatremia: Secondary | ICD-10-CM

## 2015-01-19 LAB — CBC WITH DIFFERENTIAL/PLATELET
BASOS ABS: 0.1 10*3/uL (ref 0.0–0.1)
Basophils Relative: 0.8 % (ref 0.0–3.0)
EOS PCT: 2.6 % (ref 0.0–5.0)
Eosinophils Absolute: 0.2 10*3/uL (ref 0.0–0.7)
HEMATOCRIT: 38.8 % — AB (ref 39.0–52.0)
Hemoglobin: 13.1 g/dL (ref 13.0–17.0)
LYMPHS ABS: 1.4 10*3/uL (ref 0.7–4.0)
Lymphocytes Relative: 21.7 % (ref 12.0–46.0)
MCHC: 33.9 g/dL (ref 30.0–36.0)
MCV: 90.8 fl (ref 78.0–100.0)
Monocytes Absolute: 0.6 10*3/uL (ref 0.1–1.0)
Monocytes Relative: 9 % (ref 3.0–12.0)
NEUTROS ABS: 4.1 10*3/uL (ref 1.4–7.7)
Neutrophils Relative %: 65.9 % (ref 43.0–77.0)
Platelets: 237 10*3/uL (ref 150.0–400.0)
RBC: 4.27 Mil/uL (ref 4.22–5.81)
RDW: 13.4 % (ref 11.5–15.5)
WBC: 6.3 10*3/uL (ref 4.0–10.5)

## 2015-01-19 LAB — HEPATIC FUNCTION PANEL
ALT: 20 U/L (ref 0–53)
AST: 21 U/L (ref 0–37)
Albumin: 4 g/dL (ref 3.5–5.2)
Alkaline Phosphatase: 34 U/L — ABNORMAL LOW (ref 39–117)
BILIRUBIN DIRECT: 0.1 mg/dL (ref 0.0–0.3)
Total Bilirubin: 0.6 mg/dL (ref 0.2–1.2)
Total Protein: 7.6 g/dL (ref 6.0–8.3)

## 2015-01-19 LAB — LIPID PANEL
CHOLESTEROL: 135 mg/dL (ref 0–200)
HDL: 43.4 mg/dL (ref >39.00)
LDL Cholesterol: 70 mg/dL (ref 0–99)
NonHDL: 91.6
TRIGLYCERIDES: 106 mg/dL (ref 0.0–149.0)
Total CHOL/HDL Ratio: 3
VLDL: 21.2 mg/dL (ref 0.0–40.0)

## 2015-01-19 LAB — BASIC METABOLIC PANEL
BUN: 27 mg/dL — ABNORMAL HIGH (ref 6–23)
CALCIUM: 9.6 mg/dL (ref 8.4–10.5)
CO2: 31 mEq/L (ref 19–32)
Chloride: 103 mEq/L (ref 96–112)
Creatinine, Ser: 0.93 mg/dL (ref 0.40–1.50)
GFR: 81.82 mL/min (ref >60.00)
Glucose, Bld: 150 mg/dL — ABNORMAL HIGH (ref 70–99)
Potassium: 4.4 mEq/L (ref 3.5–5.1)
Sodium: 139 mEq/L (ref 135–145)

## 2015-01-19 LAB — MICROALBUMIN / CREATININE URINE RATIO
Creatinine,U: 203.3 mg/dL
Microalb Creat Ratio: 1.2 mg/g (ref 0.0–30.0)
Microalb, Ur: 2.4 mg/dL — ABNORMAL HIGH (ref 0.0–1.9)

## 2015-01-19 LAB — TSH: TSH: 2.96 u[IU]/mL (ref 0.35–4.50)

## 2015-01-19 LAB — FERRITIN: Ferritin: 24.6 ng/mL (ref 22.0–322.0)

## 2015-01-19 LAB — HEMOGLOBIN A1C: HEMOGLOBIN A1C: 7.7 % — AB (ref 4.6–6.5)

## 2015-01-19 LAB — VITAMIN B12: VITAMIN B 12: 336 pg/mL (ref 211–911)

## 2015-01-19 MED ORDER — CYANOCOBALAMIN 1000 MCG/ML IJ SOLN
1000.0000 ug | Freq: Once | INTRAMUSCULAR | Status: AC
  Administered 2015-01-19: 1000 ug via INTRAMUSCULAR

## 2015-01-20 ENCOUNTER — Other Ambulatory Visit: Payer: Medicare Other

## 2015-01-21 NOTE — Telephone Encounter (Signed)
Mailed unread message to pt  

## 2015-01-24 DIAGNOSIS — M955 Acquired deformity of pelvis: Secondary | ICD-10-CM | POA: Diagnosis not present

## 2015-01-24 DIAGNOSIS — M9905 Segmental and somatic dysfunction of pelvic region: Secondary | ICD-10-CM | POA: Diagnosis not present

## 2015-01-24 DIAGNOSIS — M9903 Segmental and somatic dysfunction of lumbar region: Secondary | ICD-10-CM | POA: Diagnosis not present

## 2015-01-24 DIAGNOSIS — M5136 Other intervertebral disc degeneration, lumbar region: Secondary | ICD-10-CM | POA: Diagnosis not present

## 2015-01-25 ENCOUNTER — Ambulatory Visit (INDEPENDENT_AMBULATORY_CARE_PROVIDER_SITE_OTHER): Payer: Medicare Other | Admitting: Internal Medicine

## 2015-01-25 ENCOUNTER — Encounter: Payer: Self-pay | Admitting: Internal Medicine

## 2015-01-25 VITALS — BP 120/58 | HR 70 | Temp 97.7°F | Ht 70.0 in | Wt 232.4 lb

## 2015-01-25 DIAGNOSIS — D649 Anemia, unspecified: Secondary | ICD-10-CM | POA: Diagnosis not present

## 2015-01-25 DIAGNOSIS — E538 Deficiency of other specified B group vitamins: Secondary | ICD-10-CM | POA: Diagnosis not present

## 2015-01-25 DIAGNOSIS — E7800 Pure hypercholesterolemia, unspecified: Secondary | ICD-10-CM

## 2015-01-25 DIAGNOSIS — E669 Obesity, unspecified: Secondary | ICD-10-CM | POA: Diagnosis not present

## 2015-01-25 DIAGNOSIS — E871 Hypo-osmolality and hyponatremia: Secondary | ICD-10-CM

## 2015-01-25 DIAGNOSIS — E119 Type 2 diabetes mellitus without complications: Secondary | ICD-10-CM

## 2015-01-25 DIAGNOSIS — Z0000 Encounter for general adult medical examination without abnormal findings: Secondary | ICD-10-CM | POA: Diagnosis not present

## 2015-01-25 DIAGNOSIS — I10 Essential (primary) hypertension: Secondary | ICD-10-CM | POA: Diagnosis not present

## 2015-01-25 NOTE — Assessment & Plan Note (Addendum)
Follow.  B12 - just checked 336.

## 2015-01-25 NOTE — Assessment & Plan Note (Signed)
Low cholesterol diet and exercise.  Continue lovastatin.  LDL just checked 01/19/15 - 70.

## 2015-01-25 NOTE — Assessment & Plan Note (Signed)
Blood pressure doing well.  Same medication regimen.  Follow pressures.  Follow metabolic panel.   

## 2015-01-25 NOTE — Assessment & Plan Note (Signed)
Continue exercise and diet adjustment.  A1c just checked - 7.7.  Continue same medication regimen.  Follow met b and a1c.

## 2015-01-25 NOTE — Progress Notes (Signed)
Patient ID: [name], male   DOB: 10/17/1926, 81 y.o.   MRN: [id number]   Subjective:    Patient ID: [name], male    DOB: 10-28-1926, 81 y.o.   MRN: [id number]  HPI  Patient here for a scheduled follow up.  States he is doing well.  Feels good.  Following sugars.  a1c just checked 7.7.  Is walking.  Is concerned regarding some color change on his right foot.  No pain.  No injury.  Better in the am and worse in the evening.     Past Medical History  Diagnosis Date  . Diabetes mellitus   . HTN (hypertension)   . Hypercholesterolemia   . Urinary outflow obstruction     mild  . GERD (gastroesophageal reflux disease)   . BPH (benign prostatic hypertrophy)   . Skin cancer   . Degenerative arthritis   . Allergic rhinitis   . Iron deficiency anemia     Current Outpatient Prescriptions on File Prior to Visit  Medication Sig Dispense Refill  . ACCU-CHEK SOFTCLIX LANCETS lancets Use as instructed 100 each 12  . aspirin (ASPIR-LOW) 81 MG EC tablet Take 81 mg by mouth daily.      . calcium-vitamin D (OSCAL 500/200 D-3) 500-200 MG-UNIT per tablet Take 1 tablet by mouth 2 (two) times daily.     Marland Kitchen docusate sodium (COLACE) 100 MG capsule Take 100 mg by mouth 2 (two) times daily.    Marland Kitchen glucose blood test strip Use once daily to check blood sugars Dx 250.00 100 each 3  . lovastatin (MEVACOR) 20 MG tablet Take 1 tablet (20 mg total) by mouth at bedtime. 90 tablet 1  . magnesium oxide (MAG-OX) 400 MG tablet Take 400 mg by mouth daily.    . naproxen sodium (ANAPROX) 220 MG tablet Take 220 mg by mouth as needed.    . NON FORMULARY B 12 injection monthly.    . valsartan-hydrochlorothiazide (DIOVAN-HCT) 320-12.5 MG per tablet TAKE ONE (1) TABLET EACH DAY 90 tablet 1   No current facility-administered medications on file prior to visit.    Review of Systems  Constitutional: Negative for appetite change and unexpected weight change.  HENT: Negative for congestion and sinus  pressure.   Respiratory: Negative for cough, chest tightness and shortness of breath.   Cardiovascular: Negative for chest pain and palpitations.  Gastrointestinal: Negative for nausea, vomiting, abdominal pain and diarrhea.  Genitourinary: Negative for dysuria and difficulty urinating.  Musculoskeletal:       No leg or foot pain.   Skin: Negative for color change (localized to the top of right foot.  no increased  erythema or changes c/w infection. ) and rash.  Neurological: Negative for dizziness, light-headedness and headaches.       Objective:     Blood pressure recheck: 138/72  Physical Exam  Constitutional: He appears well-developed and well-nourished. No distress.  HENT:  Nose: Nose normal.  Mouth/Throat: Oropharynx is clear and moist.  Neck: Neck supple. No thyromegaly present.  Cardiovascular: Normal rate and regular rhythm.   Pulmonary/Chest: Breath sounds normal. No respiratory distress. He has no wheezes.  Abdominal: Soft. Bowel sounds are normal. There is no tenderness.  Musculoskeletal: He exhibits no edema or tenderness.  No tenderness noted over the top of the foot.   Lymphadenopathy:    He has no cervical adenopathy.  Skin: No rash noted. No erythema.  Appears to have stasis changes - top of right foot.  Psychiatric: He has a normal mood and affect. His behavior is normal.    BP 120/58 mmHg  Pulse 70  Temp(Src) 97.7 F (36.5 C) (Oral)  Ht '5\' 10"'  (1.778 m)  Wt 232 lb 6 oz (105.405 kg)  BMI 33.34 kg/m2  SpO2 95% Wt Readings from Last 3 Encounters:  01/25/15 232 lb 6 oz (105.405 kg)  09/27/14 233 lb 8 oz (105.915 kg)  07/21/14 231 lb 8 oz (105.008 kg)     Lab Results  Component Value Date   WBC 6.3 01/19/2015   HGB 13.1 01/19/2015   HCT 38.8* 01/19/2015   PLT 237.0 01/19/2015   GLUCOSE 150* 01/19/2015   CHOL 135 01/19/2015   TRIG 106.0 01/19/2015   HDL 43.40 01/19/2015   LDLCALC 70 01/19/2015   ALT 20 01/19/2015   AST 21 01/19/2015   NA  139 01/19/2015   K 4.4 01/19/2015   CL 103 01/19/2015   CREATININE 0.93 01/19/2015   BUN 27* 01/19/2015   CO2 31 01/19/2015   TSH 2.96 01/19/2015   PSA 0.45 06/02/2014   HGBA1C 7.7* 01/19/2015   MICROALBUR 2.4* 01/19/2015       Assessment & Plan:   Problem List Items Addressed This Visit    Anemia    Follow cbc.        B12 deficiency    Follow.  B12 - just checked 336.      Diabetes mellitus    Continue exercise and diet adjustment.  A1c just checked - 7.7.  Continue same medication regimen.  Follow met b and a1c.        Relevant Orders   Hemoglobin A1c   Health care maintenance    Physical 09/27/14.  PSA .45 06/02/14.        Hypercholesterolemia    Low cholesterol diet and exercise.  Continue lovastatin.  LDL just checked 01/19/15 - 70.        Relevant Orders   Hepatic function panel   Lipid panel   HYPERTENSION, BENIGN - Primary    Blood pressure doing well.  Same medication regimen.  Follow pressures.  Follow metabolic panel.       Relevant Orders   Basic metabolic panel   Hyponatremia    Sodium 01/19/15 - wnl.       Obesity (BMI 30-39.9)    Diet and exercise.          I spent 25 minutes with the patient and more than 50% of the time was spent in consultation regarding the above.     Einar Pheasant, MD

## 2015-01-25 NOTE — Assessment & Plan Note (Signed)
Sodium 01/19/15 - wnl.

## 2015-01-25 NOTE — Assessment & Plan Note (Signed)
Follow cbc.  

## 2015-01-25 NOTE — Assessment & Plan Note (Signed)
Physical 09/27/14.  PSA .45 06/02/14.

## 2015-01-25 NOTE — Assessment & Plan Note (Signed)
Diet and exercise.   

## 2015-01-25 NOTE — Progress Notes (Signed)
Pre visit review using our clinic review tool, if applicable. No additional management support is needed unless otherwise documented below in the visit note. 

## 2015-02-07 DIAGNOSIS — M5136 Other intervertebral disc degeneration, lumbar region: Secondary | ICD-10-CM | POA: Diagnosis not present

## 2015-02-07 DIAGNOSIS — M9905 Segmental and somatic dysfunction of pelvic region: Secondary | ICD-10-CM | POA: Diagnosis not present

## 2015-02-07 DIAGNOSIS — M955 Acquired deformity of pelvis: Secondary | ICD-10-CM | POA: Diagnosis not present

## 2015-02-07 DIAGNOSIS — M9903 Segmental and somatic dysfunction of lumbar region: Secondary | ICD-10-CM | POA: Diagnosis not present

## 2015-02-19 DIAGNOSIS — M9903 Segmental and somatic dysfunction of lumbar region: Secondary | ICD-10-CM | POA: Diagnosis not present

## 2015-02-19 DIAGNOSIS — M9905 Segmental and somatic dysfunction of pelvic region: Secondary | ICD-10-CM | POA: Diagnosis not present

## 2015-02-19 DIAGNOSIS — M5136 Other intervertebral disc degeneration, lumbar region: Secondary | ICD-10-CM | POA: Diagnosis not present

## 2015-02-19 DIAGNOSIS — M955 Acquired deformity of pelvis: Secondary | ICD-10-CM | POA: Diagnosis not present

## 2015-02-21 DIAGNOSIS — M955 Acquired deformity of pelvis: Secondary | ICD-10-CM | POA: Diagnosis not present

## 2015-02-21 DIAGNOSIS — M5136 Other intervertebral disc degeneration, lumbar region: Secondary | ICD-10-CM | POA: Diagnosis not present

## 2015-02-21 DIAGNOSIS — M9905 Segmental and somatic dysfunction of pelvic region: Secondary | ICD-10-CM | POA: Diagnosis not present

## 2015-02-21 DIAGNOSIS — M9903 Segmental and somatic dysfunction of lumbar region: Secondary | ICD-10-CM | POA: Diagnosis not present

## 2015-03-06 NOTE — Op Note (Signed)
PATIENT NAME:  [name], [name] MR#:  [id number] DATE OF BIRTH:  11/23/1925  DATE OF PROCEDURE:  01/11/2015  PREOPERATIVE DIAGNOSIS:  Nuclear sclerotic cataract of the right eye.   POSTOPERATIVE DIAGNOSIS:  Nuclear sclerotic cataract of the right eye.   OPERATIVE PROCEDURE:  Cataract extraction by phacoemulsification with implant of intraocular lens to right eye.   SURGEON:  Birder Robson, MD.   ANESTHESIA:  1. Managed anesthesia care.  2. Topical tetracaine drops followed by 2% Xylocaine jelly applied in the preoperative holding area.   COMPLICATIONS:  None.   TECHNIQUE:   Stop and chop.  DESCRIPTION OF PROCEDURE:  The patient was examined and consented in the preoperative holding area where the aforementioned topical anesthesia was applied to the right eye and then brought back to the operating room where the right eye was prepped and draped in the usual sterile ophthalmic fashion and a lid speculum was placed. A paracentesis was created with the side port blade and the anterior chamber was filled with viscoelastic. A near clear corneal incision was performed with the steel keratome. A continuous curvilinear capsulorrhexis was performed with a cystotome followed by the capsulorrhexis forceps. Hydrodissection and hydrodelineation were carried out with BSS on a blunt cannula. The lens was removed in a stop and chop technique and the remaining cortical material was removed with the irrigation-aspiration handpiece. The capsular bag was inflated with viscoelastic and the Tecnis ZCB00 26.5-diopter lens, serial number [id number] was placed in the capsular bag without complication. The remaining viscoelastic was removed from the eye with the irrigation-aspiration handpiece. The wounds were hydrated. The anterior chamber was flushed with Miostat and the eye was inflated to physiologic pressure. 0.1 mL of cefuroxime concentration 10 mg/mL was placed in the anterior chamber. The wounds were found to  be water tight. The eye was dressed with Vigamox. The patient was given protective glasses to wear throughout the day and a shield with which to sleep tonight. The patient was also given drops with which to begin a drop regimen today and will follow-up with me in one day.    ____________________________ Livingston Diones. Bobi Daudelin, MD wlp:bm D: 01/11/2015 21:39:54 ET T: 01/12/2015 04:31:40 ET JOB#: [id number]  cc: Lilie Vezina L. Shyonna Carlin, MD, <Dictator> Livingston Diones Aune Adami MD ELECTRONICALLY SIGNED 01/12/2015 8:58

## 2015-03-06 NOTE — Op Note (Signed)
PATIENT NAME:  [name], [name] MR#:  [id number] DATE OF BIRTH:  12-18-1925  DATE OF PROCEDURE:  12/28/2014  PREOPERATIVE DIAGNOSIS:  Nuclear sclerotic cataract of the left eye.   POSTOPERATIVE DIAGNOSIS:  Nuclear sclerotic cataract of the left eye.   OPERATIVE PROCEDURE:  Cataract extraction by phacoemulsification with implant of intraocular lens to left eye.   SURGEON:  Birder Robson, MD.   ANESTHESIA:  1. Managed anesthesia care.  2. Topical tetracaine drops followed by 2% Xylocaine jelly applied in the preoperative holding area.   COMPLICATIONS:  None.   TECHNIQUE:   Stop and chop.  DESCRIPTION OF PROCEDURE:  The patient was examined and consented in the preoperative holding area where the aforementioned topical anesthesia was applied to the left eye and then brought back to the Operating Room where the left eye was prepped and draped in the usual sterile ophthalmic fashion and a lid speculum was placed. A paracentesis was created with the side port blade and the anterior chamber was filled with viscoelastic. A near clear corneal incision was performed with the steel keratome. A continuous curvilinear capsulorrhexis was performed with a cystotome followed by the capsulorrhexis forceps. Hydrodissection and hydrodelineation were carried out with BSS on a blunt cannula. The lens was removed in a stop and chop technique and the remaining cortical material was removed with the irrigation-aspiration handpiece. The capsular bag was inflated with viscoelastic and the Tecnis ZCB00 26.5-diopter lens, serial number [id number] was placed in the capsular bag without complication. The remaining viscoelastic was removed from the eye with the irrigation-aspiration handpiece. The wounds were hydrated. The anterior chamber was flushed with Miostat and the eye was inflated to physiologic pressure. 0.1 mL of cefuroxime concentration 10 mg/mL was placed in the anterior chamber. The wounds were found to be  water tight. The eye was dressed with Vigamox. The patient was given protective glasses to wear throughout the day and a shield with which to sleep tonight. The patient was also given drops with which to begin a drop regimen today and will follow-up with me in one day.    ____________________________ Livingston Diones. Mukund Weinreb, MD wlp:mw D: 12/28/2014 20:09:35 ET T: 12/29/2014 05:31:42 ET JOB#: [id number]  cc: Lashon Beringer L. Edlyn Rosenburg, MD, <Dictator> Livingston Diones Raelene Trew MD ELECTRONICALLY SIGNED 12/30/2014 10:31

## 2015-03-07 DIAGNOSIS — M955 Acquired deformity of pelvis: Secondary | ICD-10-CM | POA: Diagnosis not present

## 2015-03-07 DIAGNOSIS — M9905 Segmental and somatic dysfunction of pelvic region: Secondary | ICD-10-CM | POA: Diagnosis not present

## 2015-03-07 DIAGNOSIS — M9903 Segmental and somatic dysfunction of lumbar region: Secondary | ICD-10-CM | POA: Diagnosis not present

## 2015-03-07 DIAGNOSIS — M5136 Other intervertebral disc degeneration, lumbar region: Secondary | ICD-10-CM | POA: Diagnosis not present

## 2015-03-15 ENCOUNTER — Ambulatory Visit (INDEPENDENT_AMBULATORY_CARE_PROVIDER_SITE_OTHER): Payer: Medicare Other | Admitting: *Deleted

## 2015-03-15 DIAGNOSIS — E538 Deficiency of other specified B group vitamins: Secondary | ICD-10-CM

## 2015-03-15 MED ORDER — CYANOCOBALAMIN 1000 MCG/ML IJ SOLN
1000.0000 ug | Freq: Once | INTRAMUSCULAR | Status: AC
  Administered 2015-03-15: 1000 ug via INTRAMUSCULAR

## 2015-03-21 DIAGNOSIS — M955 Acquired deformity of pelvis: Secondary | ICD-10-CM | POA: Diagnosis not present

## 2015-03-21 DIAGNOSIS — M9905 Segmental and somatic dysfunction of pelvic region: Secondary | ICD-10-CM | POA: Diagnosis not present

## 2015-03-21 DIAGNOSIS — M9903 Segmental and somatic dysfunction of lumbar region: Secondary | ICD-10-CM | POA: Diagnosis not present

## 2015-03-21 DIAGNOSIS — M5136 Other intervertebral disc degeneration, lumbar region: Secondary | ICD-10-CM | POA: Diagnosis not present

## 2015-03-30 DIAGNOSIS — L570 Actinic keratosis: Secondary | ICD-10-CM | POA: Diagnosis not present

## 2015-03-30 DIAGNOSIS — L578 Other skin changes due to chronic exposure to nonionizing radiation: Secondary | ICD-10-CM | POA: Diagnosis not present

## 2015-03-30 DIAGNOSIS — D229 Melanocytic nevi, unspecified: Secondary | ICD-10-CM | POA: Diagnosis not present

## 2015-03-30 DIAGNOSIS — L814 Other melanin hyperpigmentation: Secondary | ICD-10-CM | POA: Diagnosis not present

## 2015-03-30 DIAGNOSIS — L821 Other seborrheic keratosis: Secondary | ICD-10-CM | POA: Diagnosis not present

## 2015-03-30 DIAGNOSIS — Z85828 Personal history of other malignant neoplasm of skin: Secondary | ICD-10-CM | POA: Diagnosis not present

## 2015-04-11 DIAGNOSIS — M955 Acquired deformity of pelvis: Secondary | ICD-10-CM | POA: Diagnosis not present

## 2015-04-11 DIAGNOSIS — M9903 Segmental and somatic dysfunction of lumbar region: Secondary | ICD-10-CM | POA: Diagnosis not present

## 2015-04-11 DIAGNOSIS — M9905 Segmental and somatic dysfunction of pelvic region: Secondary | ICD-10-CM | POA: Diagnosis not present

## 2015-04-11 DIAGNOSIS — M5136 Other intervertebral disc degeneration, lumbar region: Secondary | ICD-10-CM | POA: Diagnosis not present

## 2015-04-21 ENCOUNTER — Ambulatory Visit (INDEPENDENT_AMBULATORY_CARE_PROVIDER_SITE_OTHER): Payer: Medicare Other

## 2015-04-21 DIAGNOSIS — E538 Deficiency of other specified B group vitamins: Secondary | ICD-10-CM | POA: Diagnosis not present

## 2015-04-21 MED ORDER — CYANOCOBALAMIN 1000 MCG/ML IJ SOLN
1000.0000 ug | Freq: Once | INTRAMUSCULAR | Status: AC
  Administered 2015-04-21: 1000 ug via INTRAMUSCULAR

## 2015-04-21 NOTE — Progress Notes (Signed)
Patient came in for monthly b12 injection.  Received in Right deltoid.  Patient tolerated well.

## 2015-05-02 ENCOUNTER — Other Ambulatory Visit: Payer: Self-pay

## 2015-05-10 DIAGNOSIS — M9905 Segmental and somatic dysfunction of pelvic region: Secondary | ICD-10-CM | POA: Diagnosis not present

## 2015-05-10 DIAGNOSIS — M9903 Segmental and somatic dysfunction of lumbar region: Secondary | ICD-10-CM | POA: Diagnosis not present

## 2015-05-10 DIAGNOSIS — M955 Acquired deformity of pelvis: Secondary | ICD-10-CM | POA: Diagnosis not present

## 2015-05-10 DIAGNOSIS — M5136 Other intervertebral disc degeneration, lumbar region: Secondary | ICD-10-CM | POA: Diagnosis not present

## 2015-05-31 ENCOUNTER — Encounter: Payer: Self-pay | Admitting: Internal Medicine

## 2015-05-31 ENCOUNTER — Ambulatory Visit (INDEPENDENT_AMBULATORY_CARE_PROVIDER_SITE_OTHER): Payer: Medicare Other | Admitting: Internal Medicine

## 2015-05-31 VITALS — BP 135/69 | HR 69 | Temp 98.2°F | Ht 70.0 in | Wt 230.5 lb

## 2015-05-31 DIAGNOSIS — E119 Type 2 diabetes mellitus without complications: Secondary | ICD-10-CM | POA: Diagnosis not present

## 2015-05-31 DIAGNOSIS — E669 Obesity, unspecified: Secondary | ICD-10-CM

## 2015-05-31 DIAGNOSIS — E871 Hypo-osmolality and hyponatremia: Secondary | ICD-10-CM

## 2015-05-31 DIAGNOSIS — D649 Anemia, unspecified: Secondary | ICD-10-CM

## 2015-05-31 DIAGNOSIS — E7800 Pure hypercholesterolemia, unspecified: Secondary | ICD-10-CM

## 2015-05-31 DIAGNOSIS — Z125 Encounter for screening for malignant neoplasm of prostate: Secondary | ICD-10-CM | POA: Diagnosis not present

## 2015-05-31 DIAGNOSIS — I10 Essential (primary) hypertension: Secondary | ICD-10-CM

## 2015-05-31 DIAGNOSIS — Z0000 Encounter for general adult medical examination without abnormal findings: Secondary | ICD-10-CM

## 2015-05-31 DIAGNOSIS — E538 Deficiency of other specified B group vitamins: Secondary | ICD-10-CM | POA: Diagnosis not present

## 2015-05-31 MED ORDER — CYANOCOBALAMIN 1000 MCG/ML IJ SOLN
1000.0000 ug | Freq: Once | INTRAMUSCULAR | Status: AC
  Administered 2015-05-31: 1000 ug via INTRAMUSCULAR

## 2015-05-31 NOTE — Progress Notes (Signed)
Pre visit review using our clinic review tool, if applicable. No additional management support is needed unless otherwise documented below in the visit note. 

## 2015-05-31 NOTE — Progress Notes (Signed)
Patient ID: [name], male   DOB: 1926/05/19, 81 y.o.   MRN: [id number]   Subjective:    Patient ID: [name], male    DOB: 1926/04/22, 81 y.o.   MRN: [id number]  HPI  Patient here for a scheduled follow up.   States his sugars are averaging 140-160.  Trying to watch his diet.  Not walking as much.  Wife is not able to get around as well.  He takes care of her.  States she was started on seroquel a few weeks ago.  This has helped the increased agitation.  He appears to be handling things well.  No cardiac symptoms with increased activity or exertion.  No sob.  Eating and drinking well.  Bowels stable.     Past Medical History  Diagnosis Date  . Diabetes mellitus   . HTN (hypertension)   . Hypercholesterolemia   . Urinary outflow obstruction     mild  . GERD (gastroesophageal reflux disease)   . BPH (benign prostatic hypertrophy)   . Skin cancer   . Degenerative arthritis   . Allergic rhinitis   . Iron deficiency anemia     Outpatient Encounter Prescriptions as of 05/31/2015  Medication Sig  . ACCU-CHEK SOFTCLIX LANCETS lancets Use as instructed  . aspirin (ASPIR-LOW) 81 MG EC tablet Take 81 mg by mouth daily.    . calcium-vitamin D (OSCAL 500/200 D-3) 500-200 MG-UNIT per tablet Take 1 tablet by mouth 2 (two) times daily.   Marland Kitchen docusate sodium (COLACE) 100 MG capsule Take 100 mg by mouth 2 (two) times daily.  Marland Kitchen glucose blood test strip Use once daily to check blood sugars Dx 250.00  . lovastatin (MEVACOR) 20 MG tablet Take 1 tablet (20 mg total) by mouth at bedtime.  . magnesium oxide (MAG-OX) 400 MG tablet Take 400 mg by mouth daily.  . naproxen sodium (ANAPROX) 220 MG tablet Take 220 mg by mouth as needed.  . NON FORMULARY B 12 injection monthly.  . valsartan-hydrochlorothiazide (DIOVAN-HCT) 320-12.5 MG per tablet TAKE ONE (1) TABLET EACH DAY  . [EXPIRED] cyanocobalamin ((VITAMIN B-12)) injection 1,000 mcg    No facility-administered encounter medications on  file as of 05/31/2015.    Review of Systems  Constitutional: Negative for appetite change and unexpected weight change.  HENT: Negative for congestion and sinus pressure.   Respiratory: Negative for cough, chest tightness and shortness of breath.   Cardiovascular: Negative for chest pain, palpitations and leg swelling.  Gastrointestinal: Negative for nausea, vomiting, abdominal pain and diarrhea.  Neurological: Negative for dizziness, light-headedness and headaches.  Psychiatric/Behavioral: Negative for dysphoric mood and agitation.       Objective:    Physical Exam  Constitutional: He appears well-developed and well-nourished. No distress.  HENT:  Nose: Nose normal.  Mouth/Throat: Oropharynx is clear and moist.  Neck: Neck supple. No thyromegaly present.  Cardiovascular: Normal rate and regular rhythm.   Pulmonary/Chest: Effort normal and breath sounds normal. No respiratory distress.  Abdominal: Soft. Bowel sounds are normal. There is no tenderness.  Musculoskeletal: He exhibits no tenderness.  No significant edema.    Lymphadenopathy:    He has no cervical adenopathy.  Skin: No rash noted. No erythema.  Psychiatric: He has a normal mood and affect. His behavior is normal.    BP 135/69 mmHg  Pulse 69  Temp(Src) 98.2 F (36.8 C) (Oral)  Ht '5\' 10"'  (1.778 m)  Wt 230 lb 8 oz (104.554 kg)  BMI 33.07 kg/m2  SpO2 96% Wt Readings from Last 3 Encounters:  05/31/15 230 lb 8 oz (104.554 kg)  01/25/15 232 lb 6 oz (105.405 kg)  09/27/14 233 lb 8 oz (105.915 kg)     Lab Results  Component Value Date   WBC 6.3 01/19/2015   HGB 13.1 01/19/2015   HCT 38.8* 01/19/2015   PLT 237.0 01/19/2015   GLUCOSE 150* 01/19/2015   CHOL 135 01/19/2015   TRIG 106.0 01/19/2015   HDL 43.40 01/19/2015   LDLCALC 70 01/19/2015   ALT 20 01/19/2015   AST 21 01/19/2015   NA 139 01/19/2015   K 4.4 01/19/2015   CL 103 01/19/2015   CREATININE 0.93 01/19/2015   BUN 27* 01/19/2015   CO2 31  01/19/2015   TSH 2.96 01/19/2015   PSA 0.45 06/02/2014   HGBA1C 7.7* 01/19/2015   MICROALBUR 2.4* 01/19/2015       Assessment & Plan:   Problem List Items Addressed This Visit    Anemia    Follow cbc.        Relevant Medications   cyanocobalamin ((VITAMIN B-12)) injection 1,000 mcg (Completed)   B12 deficiency - Primary   Relevant Medications   cyanocobalamin ((VITAMIN B-12)) injection 1,000 mcg (Completed)   Diabetes mellitus    Continue diet and exercise.  Follow met b and a1c.        Health care maintenance    Physical 09/27/14.  PSA .45 06/02/14.  Check psa with next labs.       Hypercholesterolemia    Low cholesterol diet and exercise.  Follow lipid panel.       HYPERTENSION, BENIGN    Blood pressure under good control.  Continue same medication regimen.  Follow pressures.  Follow metabolic panel.        Hyponatremia    Follow sodium.       Obesity (BMI 30-39.9)    Diet and exercise.        Other Visit Diagnoses    Prostate cancer screening        Relevant Orders    PSA, Medicare        Einar Pheasant, MD

## 2015-06-01 ENCOUNTER — Encounter: Payer: Self-pay | Admitting: Internal Medicine

## 2015-06-01 NOTE — Assessment & Plan Note (Signed)
Continue diet and exercise.  Follow met b and a1c.

## 2015-06-01 NOTE — Assessment & Plan Note (Signed)
Low cholesterol diet and exercise.  Follow lipid panel.   

## 2015-06-01 NOTE — Assessment & Plan Note (Signed)
Follow sodium.  

## 2015-06-01 NOTE — Assessment & Plan Note (Signed)
Follow cbc.  

## 2015-06-01 NOTE — Assessment & Plan Note (Signed)
Blood pressure under good control.  Continue same medication regimen.  Follow pressures.  Follow metabolic panel.   

## 2015-06-01 NOTE — Assessment & Plan Note (Signed)
Physical 09/27/14.  PSA .45 06/02/14.  Check psa with next labs.

## 2015-06-01 NOTE — Assessment & Plan Note (Signed)
Diet and exercise.   

## 2015-06-06 DIAGNOSIS — M9903 Segmental and somatic dysfunction of lumbar region: Secondary | ICD-10-CM | POA: Diagnosis not present

## 2015-06-06 DIAGNOSIS — M955 Acquired deformity of pelvis: Secondary | ICD-10-CM | POA: Diagnosis not present

## 2015-06-06 DIAGNOSIS — M5136 Other intervertebral disc degeneration, lumbar region: Secondary | ICD-10-CM | POA: Diagnosis not present

## 2015-06-06 DIAGNOSIS — M9905 Segmental and somatic dysfunction of pelvic region: Secondary | ICD-10-CM | POA: Diagnosis not present

## 2015-06-22 ENCOUNTER — Other Ambulatory Visit: Payer: BLUE CROSS/BLUE SHIELD

## 2015-07-04 DIAGNOSIS — M5136 Other intervertebral disc degeneration, lumbar region: Secondary | ICD-10-CM | POA: Diagnosis not present

## 2015-07-04 DIAGNOSIS — M9905 Segmental and somatic dysfunction of pelvic region: Secondary | ICD-10-CM | POA: Diagnosis not present

## 2015-07-04 DIAGNOSIS — M9903 Segmental and somatic dysfunction of lumbar region: Secondary | ICD-10-CM | POA: Diagnosis not present

## 2015-07-04 DIAGNOSIS — M955 Acquired deformity of pelvis: Secondary | ICD-10-CM | POA: Diagnosis not present

## 2015-07-07 ENCOUNTER — Other Ambulatory Visit (INDEPENDENT_AMBULATORY_CARE_PROVIDER_SITE_OTHER): Payer: Medicare Other

## 2015-07-07 DIAGNOSIS — E119 Type 2 diabetes mellitus without complications: Secondary | ICD-10-CM | POA: Diagnosis not present

## 2015-07-07 DIAGNOSIS — Z125 Encounter for screening for malignant neoplasm of prostate: Secondary | ICD-10-CM | POA: Diagnosis not present

## 2015-07-07 DIAGNOSIS — E7800 Pure hypercholesterolemia, unspecified: Secondary | ICD-10-CM

## 2015-07-07 DIAGNOSIS — I10 Essential (primary) hypertension: Secondary | ICD-10-CM

## 2015-07-07 LAB — BASIC METABOLIC PANEL
BUN: 19 mg/dL (ref 6–23)
CHLORIDE: 99 meq/L (ref 96–112)
CO2: 29 mEq/L (ref 19–32)
CREATININE: 0.87 mg/dL (ref 0.40–1.50)
Calcium: 9.6 mg/dL (ref 8.4–10.5)
GFR: 88.27 mL/min (ref >60.00)
Glucose, Bld: 141 mg/dL — ABNORMAL HIGH (ref 70–99)
POTASSIUM: 4.9 meq/L (ref 3.5–5.1)
SODIUM: 136 meq/L (ref 135–145)

## 2015-07-07 LAB — HEPATIC FUNCTION PANEL
ALT: 24 U/L (ref 0–53)
AST: 24 U/L (ref 0–37)
Albumin: 3.9 g/dL (ref 3.5–5.2)
Alkaline Phosphatase: 29 U/L — ABNORMAL LOW (ref 39–117)
BILIRUBIN DIRECT: 0.1 mg/dL (ref 0.0–0.3)
Total Bilirubin: 0.7 mg/dL (ref 0.2–1.2)
Total Protein: 7.6 g/dL (ref 6.0–8.3)

## 2015-07-07 LAB — LIPID PANEL
CHOL/HDL RATIO: 3
Cholesterol: 137 mg/dL (ref 0–200)
HDL: 40.4 mg/dL (ref >39.00)
LDL CALC: 75 mg/dL (ref 0–99)
NonHDL: 96.38
Triglycerides: 107 mg/dL (ref 0.0–149.0)
VLDL: 21.4 mg/dL (ref 0.0–40.0)

## 2015-07-07 LAB — HEMOGLOBIN A1C: HEMOGLOBIN A1C: 7.5 % — AB (ref 4.6–6.5)

## 2015-07-07 LAB — PSA, MEDICARE: PSA: 0.52 ng/mL (ref 0.10–4.00)

## 2015-07-08 ENCOUNTER — Encounter: Payer: Self-pay | Admitting: Internal Medicine

## 2015-07-12 NOTE — Telephone Encounter (Signed)
Unread mychart message mailed to patient 

## 2015-07-13 ENCOUNTER — Ambulatory Visit (INDEPENDENT_AMBULATORY_CARE_PROVIDER_SITE_OTHER): Payer: Medicare Other | Admitting: *Deleted

## 2015-07-13 DIAGNOSIS — E538 Deficiency of other specified B group vitamins: Secondary | ICD-10-CM

## 2015-07-13 MED ORDER — CYANOCOBALAMIN 1000 MCG/ML IJ SOLN
1000.0000 ug | Freq: Once | INTRAMUSCULAR | Status: AC
  Administered 2015-07-13: 1000 ug via INTRAMUSCULAR

## 2015-07-19 ENCOUNTER — Ambulatory Visit (INDEPENDENT_AMBULATORY_CARE_PROVIDER_SITE_OTHER): Payer: Medicare Other

## 2015-07-19 DIAGNOSIS — Z23 Encounter for immunization: Secondary | ICD-10-CM

## 2015-07-20 ENCOUNTER — Ambulatory Visit (INDEPENDENT_AMBULATORY_CARE_PROVIDER_SITE_OTHER): Payer: Medicare Other | Admitting: Cardiovascular Disease

## 2015-07-20 ENCOUNTER — Encounter: Payer: Self-pay | Admitting: Cardiovascular Disease

## 2015-07-20 VITALS — BP 142/62 | HR 66 | Ht 71.0 in | Wt 231.0 lb

## 2015-07-20 DIAGNOSIS — I10 Essential (primary) hypertension: Secondary | ICD-10-CM | POA: Diagnosis not present

## 2015-07-20 DIAGNOSIS — E7800 Pure hypercholesterolemia, unspecified: Secondary | ICD-10-CM

## 2015-07-20 DIAGNOSIS — E119 Type 2 diabetes mellitus without complications: Secondary | ICD-10-CM

## 2015-07-20 DIAGNOSIS — R61 Generalized hyperhidrosis: Secondary | ICD-10-CM | POA: Diagnosis not present

## 2015-07-20 DIAGNOSIS — IMO0001 Reserved for inherently not codable concepts without codable children: Secondary | ICD-10-CM | POA: Insufficient documentation

## 2015-07-20 DIAGNOSIS — R208 Other disturbances of skin sensation: Secondary | ICD-10-CM

## 2015-07-20 DIAGNOSIS — R209 Unspecified disturbances of skin sensation: Secondary | ICD-10-CM

## 2015-07-20 NOTE — Assessment & Plan Note (Signed)
No history of PAD. Seems to have good extremity pulses. Etiology unclear. Recommended he continue to wear extra socks for symptom relief

## 2015-07-20 NOTE — Patient Instructions (Signed)
You are doing well. No medication changes were made.  Please call us if you have new issues that need to be addressed before your next appt.  Your physician wants you to follow-up in: 12 months.  You will receive a reminder letter in the mail two months in advance. If you don't receive a letter, please call our office to schedule the follow-up appointment. 

## 2015-07-20 NOTE — Assessment & Plan Note (Signed)
Cholesterol is at goal on the current lipid regimen. No changes to the medications were made.  

## 2015-07-20 NOTE — Assessment & Plan Note (Signed)
He reports episodes of sweating Possibly related to the weather. Recommended he wait until the fall or winter and if symptoms have not resolved, further workup could be done. Could check TSH, testosterone level

## 2015-07-20 NOTE — Assessment & Plan Note (Signed)
Blood pressure is well controlled on today's visit. No changes made to the medications. 

## 2015-07-20 NOTE — Assessment & Plan Note (Signed)
We have encouraged continued exercise, careful diet management in an effort to lose weight. 

## 2015-07-20 NOTE — Progress Notes (Signed)
Patient ID: [name], male    DOB: 08/01/1926, 81 y.o.   MRN: [id number]  HPI Comments: [name] is a pleasant 81 yo male with a h/o HTN, diabetes, hemoglobin A1c >7, obesity , hyperlipidemia, pernicious anemia, history of palpitations  Presenting for routine followup of his hypertension  He reports he is doing well, continues to take care of his wife who has advanced Parkinson's Relatively sedentary as he is taking care of his wife Lab work reviewed with him Hemoglobin A1c down to 7.5 Total cholesterol 137, LDL 75. He is taking lovastatin 10 mg daily for many years No significant complaints apart from cold feet,  occasionally has sweats. Etiology unclear, possibly the weather  EKG on today's visit shows normal sinus rhythm with rate 66 bpm, no significant ST or T-wave changes  Other past medical history Previous Holter monitor 3 years showed normal sinus rhythm per the notes    Allergies  Allergen Reactions  . No Known Drug Allergy     Current Outpatient Prescriptions on File Prior to Visit  Medication Sig Dispense Refill  . ACCU-CHEK SOFTCLIX LANCETS lancets Use as instructed 100 each 12  . aspirin (ASPIR-LOW) 81 MG EC tablet Take 81 mg by mouth daily.      . calcium-vitamin D (OSCAL 500/200 D-3) 500-200 MG-UNIT per tablet Take 1 tablet by mouth 2 (two) times daily.     Marland Kitchen docusate sodium (COLACE) 100 MG capsule Take 100 mg by mouth 2 (two) times daily.    Marland Kitchen glucose blood test strip Use once daily to check blood sugars Dx 250.00 100 each 3  . lovastatin (MEVACOR) 20 MG tablet Take 1 tablet (20 mg total) by mouth at bedtime. (Patient taking differently: Take 0.5 mg by mouth at bedtime. ) 90 tablet 1  . magnesium oxide (MAG-OX) 400 MG tablet Take 400 mg by mouth daily.    . naproxen sodium (ANAPROX) 220 MG tablet Take 220 mg by mouth as needed.    . NON FORMULARY B 12 injection monthly.    . valsartan-hydrochlorothiazide (DIOVAN-HCT) 320-12.5 MG per tablet TAKE ONE  (1) TABLET EACH DAY 90 tablet 1   No current facility-administered medications on file prior to visit.    Past Medical History  Diagnosis Date  . Diabetes mellitus   . HTN (hypertension)   . Hypercholesterolemia   . Urinary outflow obstruction     mild  . GERD (gastroesophageal reflux disease)   . BPH (benign prostatic hypertrophy)   . Skin cancer   . Degenerative arthritis   . Allergic rhinitis   . Iron deficiency anemia     Past Surgical History  Procedure Laterality Date  . Pilonidal cyst excision  1951    removal  . Rotator cuff repair  1995  . Tonsillectomy and adenoidectomy  1938  . Skin cancer excision      multiple    Social History  reports that he has never smoked. He has never used smokeless tobacco. He reports that he does not drink alcohol or use illicit drugs.  Family History family history includes Alzheimer's disease in his mother; CVA in his brother; Heart disease in his father.      Review of Systems  Constitutional: Negative.   HENT: Negative.   Respiratory: Negative.   Cardiovascular: Negative.   Gastrointestinal: Negative.   Musculoskeletal: Negative.   Skin: Negative.   Neurological: Negative.   Hematological: Negative.   Psychiatric/Behavioral: Negative.   All other systems reviewed and are  negative.   BP 142/62 mmHg  Pulse 66  Ht 5\' 11"  (1.803 m)  Wt 231 lb (104.781 kg)  BMI 32.23 kg/m2  Physical Exam  Constitutional: He is oriented to person, place, and time. He appears well-developed and well-nourished.  Obese  HENT:  Head: Normocephalic.  Nose: Nose normal.  Mouth/Throat: Oropharynx is clear and moist.  Eyes: Conjunctivae are normal. Pupils are equal, round, and reactive to light.  Neck: Normal range of motion. Neck supple. No JVD present.  Cardiovascular: Normal rate, regular rhythm, S1 normal, S2 normal, normal heart sounds and intact distal pulses.  Exam reveals no gallop and no friction rub.   No murmur  heard. Pulmonary/Chest: Effort normal and breath sounds normal. No respiratory distress. He has no wheezes. He has no rales. He exhibits no tenderness.  Abdominal: Soft. Bowel sounds are normal. He exhibits no distension. There is no tenderness.  Musculoskeletal: Normal range of motion. He exhibits no edema or tenderness.  Lymphadenopathy:    He has no cervical adenopathy.  Neurological: He is alert and oriented to person, place, and time. Coordination normal.  Skin: Skin is warm and dry. No rash noted. No erythema.  Psychiatric: He has a normal mood and affect. His behavior is normal. Judgment and thought content normal.      Assessment and Plan   Nursing note and vitals reviewed.

## 2015-07-22 ENCOUNTER — Ambulatory Visit: Payer: BLUE CROSS/BLUE SHIELD | Admitting: Cardiovascular Disease

## 2015-08-01 DIAGNOSIS — M955 Acquired deformity of pelvis: Secondary | ICD-10-CM | POA: Diagnosis not present

## 2015-08-01 DIAGNOSIS — M5136 Other intervertebral disc degeneration, lumbar region: Secondary | ICD-10-CM | POA: Diagnosis not present

## 2015-08-01 DIAGNOSIS — M9903 Segmental and somatic dysfunction of lumbar region: Secondary | ICD-10-CM | POA: Diagnosis not present

## 2015-08-01 DIAGNOSIS — M9905 Segmental and somatic dysfunction of pelvic region: Secondary | ICD-10-CM | POA: Diagnosis not present

## 2015-08-11 DIAGNOSIS — Z961 Presence of intraocular lens: Secondary | ICD-10-CM | POA: Diagnosis not present

## 2015-08-11 LAB — HM DIABETES EYE EXAM

## 2015-08-12 ENCOUNTER — Encounter: Payer: Self-pay | Admitting: Internal Medicine

## 2015-08-15 DIAGNOSIS — M9905 Segmental and somatic dysfunction of pelvic region: Secondary | ICD-10-CM | POA: Diagnosis not present

## 2015-08-15 DIAGNOSIS — M955 Acquired deformity of pelvis: Secondary | ICD-10-CM | POA: Diagnosis not present

## 2015-08-15 DIAGNOSIS — M9903 Segmental and somatic dysfunction of lumbar region: Secondary | ICD-10-CM | POA: Diagnosis not present

## 2015-08-15 DIAGNOSIS — M5136 Other intervertebral disc degeneration, lumbar region: Secondary | ICD-10-CM | POA: Diagnosis not present

## 2015-08-25 ENCOUNTER — Ambulatory Visit (INDEPENDENT_AMBULATORY_CARE_PROVIDER_SITE_OTHER): Payer: Medicare Other

## 2015-08-25 DIAGNOSIS — E538 Deficiency of other specified B group vitamins: Secondary | ICD-10-CM | POA: Diagnosis not present

## 2015-08-25 MED ORDER — CYANOCOBALAMIN 1000 MCG/ML IJ SOLN
1000.0000 ug | Freq: Once | INTRAMUSCULAR | Status: AC
  Administered 2015-08-25: 1000 ug via INTRAMUSCULAR

## 2015-08-25 NOTE — Progress Notes (Signed)
Patient came in for b12 injection.  Received in Left deltoid.  Patient tolerated well  

## 2015-09-01 DIAGNOSIS — L219 Seborrheic dermatitis, unspecified: Secondary | ICD-10-CM | POA: Diagnosis not present

## 2015-09-01 DIAGNOSIS — L299 Pruritus, unspecified: Secondary | ICD-10-CM | POA: Diagnosis not present

## 2015-09-01 DIAGNOSIS — L239 Allergic contact dermatitis, unspecified cause: Secondary | ICD-10-CM | POA: Diagnosis not present

## 2015-09-01 DIAGNOSIS — L821 Other seborrheic keratosis: Secondary | ICD-10-CM | POA: Diagnosis not present

## 2015-09-03 ENCOUNTER — Other Ambulatory Visit: Payer: Self-pay | Admitting: Internal Medicine

## 2015-09-04 ENCOUNTER — Encounter: Payer: Self-pay | Admitting: Internal Medicine

## 2015-09-05 DIAGNOSIS — H532 Diplopia: Secondary | ICD-10-CM | POA: Diagnosis not present

## 2015-09-05 NOTE — Telephone Encounter (Signed)
Please call pt and confirm no chest pain, sob or other acute symptoms.  If no acute symptoms that need evaluation today, then I can either see him on Tuesday 11:30 (block 75min) or on Wednesday at 8:00 (block 30 min) - if these spots are still open.  I will see if I can get ashleigh to hold these spots for work in appts.  Let me know if any problems.

## 2015-09-12 ENCOUNTER — Telehealth: Payer: Self-pay | Admitting: Internal Medicine

## 2015-09-12 ENCOUNTER — Ambulatory Visit (INDEPENDENT_AMBULATORY_CARE_PROVIDER_SITE_OTHER): Payer: Medicare Other | Admitting: Family Medicine

## 2015-09-12 ENCOUNTER — Observation Stay
Admission: EM | Admit: 2015-09-12 | Discharge: 2015-09-13 | Disposition: A | Discharge status: Home / Self Care | Payer: Medicare Other | Attending: Internal Medicine | Admitting: Internal Medicine

## 2015-09-12 ENCOUNTER — Encounter: Payer: Self-pay | Admitting: Family Medicine

## 2015-09-12 ENCOUNTER — Emergency Department: Payer: Medicare Other

## 2015-09-12 VITALS — BP 130/68 | HR 71 | Temp 97.6°F | Ht 71.0 in | Wt 227.4 lb

## 2015-09-12 DIAGNOSIS — M9903 Segmental and somatic dysfunction of lumbar region: Secondary | ICD-10-CM | POA: Diagnosis not present

## 2015-09-12 DIAGNOSIS — E669 Obesity, unspecified: Secondary | ICD-10-CM | POA: Insufficient documentation

## 2015-09-12 DIAGNOSIS — Z6830 Body mass index (BMI) 30.0-30.9, adult: Secondary | ICD-10-CM | POA: Diagnosis not present

## 2015-09-12 DIAGNOSIS — R29898 Other symptoms and signs involving the musculoskeletal system: Secondary | ICD-10-CM | POA: Diagnosis not present

## 2015-09-12 DIAGNOSIS — E860 Dehydration: Secondary | ICD-10-CM | POA: Insufficient documentation

## 2015-09-12 DIAGNOSIS — D509 Iron deficiency anemia, unspecified: Secondary | ICD-10-CM | POA: Diagnosis not present

## 2015-09-12 DIAGNOSIS — J309 Allergic rhinitis, unspecified: Secondary | ICD-10-CM | POA: Insufficient documentation

## 2015-09-12 DIAGNOSIS — Z8249 Family history of ischemic heart disease and other diseases of the circulatory system: Secondary | ICD-10-CM | POA: Diagnosis not present

## 2015-09-12 DIAGNOSIS — I639 Cerebral infarction, unspecified: Secondary | ICD-10-CM | POA: Diagnosis present

## 2015-09-12 DIAGNOSIS — I491 Atrial premature depolarization: Secondary | ICD-10-CM | POA: Diagnosis not present

## 2015-09-12 DIAGNOSIS — K219 Gastro-esophageal reflux disease without esophagitis: Secondary | ICD-10-CM | POA: Insufficient documentation

## 2015-09-12 DIAGNOSIS — R531 Weakness: Principal | ICD-10-CM | POA: Insufficient documentation

## 2015-09-12 DIAGNOSIS — N401 Enlarged prostate with lower urinary tract symptoms: Secondary | ICD-10-CM | POA: Diagnosis not present

## 2015-09-12 DIAGNOSIS — E871 Hypo-osmolality and hyponatremia: Secondary | ICD-10-CM | POA: Insufficient documentation

## 2015-09-12 DIAGNOSIS — Z85828 Personal history of other malignant neoplasm of skin: Secondary | ICD-10-CM | POA: Diagnosis not present

## 2015-09-12 DIAGNOSIS — G459 Transient cerebral ischemic attack, unspecified: Secondary | ICD-10-CM | POA: Diagnosis present

## 2015-09-12 DIAGNOSIS — E7800 Pure hypercholesterolemia, unspecified: Secondary | ICD-10-CM | POA: Diagnosis not present

## 2015-09-12 DIAGNOSIS — N138 Other obstructive and reflux uropathy: Secondary | ICD-10-CM | POA: Diagnosis not present

## 2015-09-12 DIAGNOSIS — I159 Secondary hypertension, unspecified: Secondary | ICD-10-CM | POA: Diagnosis not present

## 2015-09-12 DIAGNOSIS — Z794 Long term (current) use of insulin: Secondary | ICD-10-CM | POA: Diagnosis not present

## 2015-09-12 DIAGNOSIS — Z823 Family history of stroke: Secondary | ICD-10-CM | POA: Insufficient documentation

## 2015-09-12 DIAGNOSIS — R002 Palpitations: Secondary | ICD-10-CM | POA: Diagnosis not present

## 2015-09-12 DIAGNOSIS — I371 Nonrheumatic pulmonary valve insufficiency: Secondary | ICD-10-CM | POA: Insufficient documentation

## 2015-09-12 DIAGNOSIS — E119 Type 2 diabetes mellitus without complications: Secondary | ICD-10-CM | POA: Insufficient documentation

## 2015-09-12 DIAGNOSIS — M9905 Segmental and somatic dysfunction of pelvic region: Secondary | ICD-10-CM | POA: Diagnosis not present

## 2015-09-12 DIAGNOSIS — R4781 Slurred speech: Secondary | ICD-10-CM | POA: Diagnosis not present

## 2015-09-12 DIAGNOSIS — Z79899 Other long term (current) drug therapy: Secondary | ICD-10-CM | POA: Insufficient documentation

## 2015-09-12 DIAGNOSIS — M1990 Unspecified osteoarthritis, unspecified site: Secondary | ICD-10-CM | POA: Diagnosis not present

## 2015-09-12 DIAGNOSIS — R29810 Facial weakness: Secondary | ICD-10-CM | POA: Insufficient documentation

## 2015-09-12 DIAGNOSIS — I739 Peripheral vascular disease, unspecified: Secondary | ICD-10-CM | POA: Diagnosis not present

## 2015-09-12 DIAGNOSIS — K117 Disturbances of salivary secretion: Secondary | ICD-10-CM

## 2015-09-12 DIAGNOSIS — Z7982 Long term (current) use of aspirin: Secondary | ICD-10-CM | POA: Diagnosis not present

## 2015-09-12 DIAGNOSIS — I440 Atrioventricular block, first degree: Secondary | ICD-10-CM | POA: Diagnosis not present

## 2015-09-12 DIAGNOSIS — E538 Deficiency of other specified B group vitamins: Secondary | ICD-10-CM | POA: Diagnosis not present

## 2015-09-12 DIAGNOSIS — I10 Essential (primary) hypertension: Secondary | ICD-10-CM | POA: Insufficient documentation

## 2015-09-12 DIAGNOSIS — M955 Acquired deformity of pelvis: Secondary | ICD-10-CM | POA: Diagnosis not present

## 2015-09-12 DIAGNOSIS — Z820 Family history of epilepsy and other diseases of the nervous system: Secondary | ICD-10-CM | POA: Diagnosis not present

## 2015-09-12 DIAGNOSIS — M5136 Other intervertebral disc degeneration, lumbar region: Secondary | ICD-10-CM | POA: Diagnosis not present

## 2015-09-12 LAB — COMPREHENSIVE METABOLIC PANEL
ALT: 36 U/L (ref 17–63)
ANION GAP: 6 (ref 5–15)
AST: 44 U/L — ABNORMAL HIGH (ref 15–41)
Albumin: 4 g/dL (ref 3.5–5.0)
Alkaline Phosphatase: 25 U/L — ABNORMAL LOW (ref 38–126)
BILIRUBIN TOTAL: 0.8 mg/dL (ref 0.3–1.2)
BUN: 23 mg/dL — ABNORMAL HIGH (ref 6–20)
CO2: 27 mmol/L (ref 22–32)
Calcium: 9.4 mg/dL (ref 8.9–10.3)
Chloride: 101 mmol/L (ref 101–111)
Creatinine, Ser: 0.84 mg/dL (ref 0.61–1.24)
Glucose, Bld: 162 mg/dL — ABNORMAL HIGH (ref 65–99)
POTASSIUM: 4.4 mmol/L (ref 3.5–5.1)
Sodium: 134 mmol/L — ABNORMAL LOW (ref 135–145)
TOTAL PROTEIN: 8 g/dL (ref 6.5–8.1)

## 2015-09-12 LAB — TROPONIN I

## 2015-09-12 LAB — CBC
HEMATOCRIT: 38.5 % — AB (ref 40.0–52.0)
Hemoglobin: 13 g/dL (ref 13.0–18.0)
MCH: 31.6 pg (ref 26.0–34.0)
MCHC: 33.8 g/dL (ref 32.0–36.0)
MCV: 93.5 fL (ref 80.0–100.0)
Platelets: 240 10*3/uL (ref 150–440)
RBC: 4.12 MIL/uL — ABNORMAL LOW (ref 4.40–5.90)
RDW: 12.7 % (ref 11.5–14.5)
WBC: 8 10*3/uL (ref 3.8–10.6)

## 2015-09-12 MED ORDER — ASPIRIN 81 MG PO CHEW
324.0000 mg | CHEWABLE_TABLET | Freq: Once | ORAL | Status: AC
  Administered 2015-09-12: 324 mg via ORAL
  Filled 2015-09-12: qty 4

## 2015-09-12 NOTE — ED Provider Notes (Signed)
[facility] Emergency Department Provider Note  ____________________________________________  Time seen: Approximately 11:17 PM  I have reviewed the triage vital signs and the nursing notes.   HISTORY  Chief Complaint Cerebrovascular Accident and Extremity Weakness    HPI [name] is a 81 y.o. male who presents to the ED from PCPs office for imaging study for evaluation of stroke. Patient complains of unable to raise left arm above chest level 2 weeks. Family became concerned when they noted left facial drooping 3 days associated with slurred speech. No prior history of stroke. Patient denies recent trauma or travel. Denies fever, chills, neck pain, chest pain, shortness of breath, abdominal pain, nausea, vomiting, diarrhea. Nothing makes his symptoms better or worse.   Past Medical History  Diagnosis Date  . Diabetes mellitus ([place])   . HTN (hypertension)   . Hypercholesterolemia   . Urinary outflow obstruction     mild  . GERD (gastroesophageal reflux disease)   . BPH (benign prostatic hypertrophy)   . Skin cancer   . Degenerative arthritis   . Allergic rhinitis   . Iron deficiency anemia     Patient Active Problem List   Diagnosis Date Noted  . Left arm weakness 09/12/2015  . Sweating 07/20/2015  . Cold feet 07/20/2015  . Health care maintenance 01/25/2015  . B12 deficiency 11/16/2014  . Obesity (BMI 30-39.9) 09/27/2014  . Anemia 09/27/2014  . Obesity (BMI 30.0-34.9) 05/29/2014  . Hyponatremia 09/20/2013  . Diabetes mellitus ([place]) 12/06/2011  . Hypercholesterolemia 08/15/2010  . HYPERTENSION, BENIGN 08/15/2010  . PALPITATIONS, OCCASIONAL 08/15/2010    Past Surgical History  Procedure Laterality Date  . Pilonidal cyst excision  1951    removal  . Rotator cuff repair  1995  . Tonsillectomy and adenoidectomy  1938  . Skin cancer excision      multiple    Current Outpatient Rx  Name  Route  Sig  Dispense  Refill  .  aspirin (ASPIR-LOW) 81 MG EC tablet   Oral   Take 81 mg by mouth daily.           . calcium-vitamin D (OSCAL 500/200 D-3) 500-200 MG-UNIT per tablet   Oral   Take 1 tablet by mouth 2 (two) times daily.          . cyanocobalamin (,VITAMIN B-12,) 1000 MCG/ML injection   Intramuscular   Inject 1,000 mcg into the muscle every 30 (thirty) days.         Marland Kitchen docusate sodium (COLACE) 100 MG capsule   Oral   Take 100 mg by mouth 2 (two) times daily.         Marland Kitchen lovastatin (MEVACOR) 20 MG tablet   Oral   Take 1 tablet (20 mg total) by mouth at bedtime. Patient taking differently: Take 10 mg by mouth at bedtime.    90 tablet   1     Please hold until next refill.   . magnesium oxide (MAG-OX) 400 MG tablet   Oral   Take 400 mg by mouth daily.         . naproxen sodium (ANAPROX) 220 MG tablet   Oral   Take 220 mg by mouth as needed.         . valsartan-hydrochlorothiazide (DIOVAN-HCT) 320-12.5 MG tablet   Oral   Take 1 tablet by mouth daily.         Marland Kitchen ACCU-CHEK SOFTCLIX LANCETS lancets      Use as instructed  100 each   12   . glucose blood test strip      Use once daily to check blood sugars Dx 250.00   100 each   3   . valsartan-hydrochlorothiazide (DIOVAN-HCT) 320-12.5 MG tablet      TAKE ONE (1) TABLET EACH DAY   90 tablet   2     PT NEEDS REFILLS     Allergies Review of patient's allergies indicates no known allergies.  Family History  Problem Relation Age of Onset  . Heart disease Father     heart atack  . Alzheimer's disease Mother   . CVA Brother     Social History Social History  Substance Use Topics  . Smoking status: Never Smoker   . Smokeless tobacco: Never Used  . Alcohol Use: No    Review of Systems Constitutional: No fever/chills Eyes: No visual changes. ENT: No sore throat. Cardiovascular: Denies chest pain. Respiratory: Denies shortness of breath. Gastrointestinal: No abdominal pain.  No nausea, no vomiting.  No  diarrhea.  No constipation. Genitourinary: Negative for dysuria. Musculoskeletal: Negative for back pain. Skin: Negative for rash. Neurological: Negative for headache. Positive for focal weakness. Negative for numbness.  10-point ROS otherwise negative.  ____________________________________________   PHYSICAL EXAM:  VITAL SIGNS: ED Triage Vitals  Enc Vitals Group     BP 09/12/15 1656 116/95 mmHg     Pulse Rate 09/12/15 1656 73     Resp 09/12/15 1656 20     Temp 09/12/15 1656 97.7 F (36.5 C)     Temp src --      SpO2 09/12/15 1656 95 %     Weight 09/12/15 1656 227 lb (102.967 kg)     Height 09/12/15 1656 6' (1.829 m)     Head Cir --      Peak Flow --      Pain Score 09/12/15 2147 0     Pain Loc --      Pain Edu? --      Excl. in [place]? --     Constitutional: Alert and oriented. Well appearing and in no acute distress. Eyes: Conjunctivae are normal. PERRL. EOMI. Head: Atraumatic. Nose: No congestion/rhinnorhea. Mouth/Throat: Mucous membranes are moist.  Oropharynx non-erythematous. Neck: No stridor.  No carotid bruits. Cardiovascular: Normal rate, regular rhythm. Grossly normal heart sounds.  Good peripheral circulation. Respiratory: Normal respiratory effort.  No retractions. Lungs CTAB. Gastrointestinal: Soft and nontender. No distention. No abdominal bruits. No CVA tenderness. Musculoskeletal: No lower extremity tenderness nor edema.  No joint effusions. Neurologic:  Mildly slurred speech and language. Unable to close left eye fully. Left facial droop. Left mouth weakness. No tongue deviation. Left pronator drift. Left lower extremity 4/5 motor strength. Sensation intact.  Skin:  Skin is warm, dry and intact. No rash noted. Psychiatric: Mood and affect are normal. Speech and behavior are normal.  ____________________________________________   LABS (all labs ordered are listed, but only abnormal results are displayed)  Labs Reviewed  CBC - Abnormal; Notable for the  following:    RBC 4.12 (*)    HCT 38.5 (*)    All other components within normal limits  COMPREHENSIVE METABOLIC PANEL - Abnormal; Notable for the following:    Sodium 134 (*)    Glucose, Bld 162 (*)    BUN 23 (*)    AST 44 (*)    Alkaline Phosphatase 25 (*)    All other components within normal limits  TROPONIN I   ____________________________________________  EKG  ED ECG REPORT I, SUNG,JADE J, the attending physician, personally viewed and interpreted this ECG.   Date: 09/12/2015  EKG Time: 1705  Rate: 68  Rhythm: normal EKG, normal sinus rhythm  Axis: LAD  Intervals:first-degree A-V block   ST&T Change: Nonspecific  ____________________________________________  RADIOLOGY  CT head without contrast interpreted per Dr. Pollyann Kennedy: No acute abnormality.  Mild atrophy.  Mild mucosal thickening left sphenoid sinus. ____________________________________________   PROCEDURES  Procedure(s) performed: None  Critical Care performed: No  ____________________________________________   INITIAL IMPRESSION / ASSESSMENT AND PLAN / ED COURSE  Pertinent labs & imaging results that were available during my care of the patient were reviewed by me and considered in my medical decision making (see chart for details).  81 year old male who presents with left arm weakness 2 weeks, now with left facial droop 3 days which family states is new. Symptoms concerning for CVA. Patient is not a candidate for TPA as he is out of the time window to administer TPA. Will administer aspirin and discuss with hospitalist for admission. ____________________________________________   FINAL CLINICAL IMPRESSION(S) / ED DIAGNOSES  Final diagnoses:  Cerebral infarction due to unspecified mechanism  Secondary hypertension, unspecified      Paulette Blanch, MD 09/12/15 2342

## 2015-09-12 NOTE — ED Notes (Signed)
Patient transported to CT 

## 2015-09-12 NOTE — Progress Notes (Signed)
Subjective:  Patient ID: [name], male    DOB: 09-Mar-1926  Age: 81 y.o. MRN: [id number]  CC: Arm weakness, facial droop  HPI:  81 year old male with a PMH of DM, HLD, and HTN presents to the clinic today with complaints of arm weakness.  Patient reports a 1-2 week history of difficulty raising his arms above chest level (particularly the left arm).  Additionally, patient reports that he has had recent difficulty chewing foods and has had some drooling (when drinking liquids).  He was recently out with his family and a left sided facial droop was noted.  Facial droop has now improved. His main concern is the weakness.  No relieving factors. No other associated symptoms.   Social Hx   Social History   Social History  . Marital Status: Married    Spouse Name: N/A  . Number of Children: N/A  . Years of Education: N/A   Social History Main Topics  . Smoking status: Never Smoker   . Smokeless tobacco: Never Used  . Alcohol Use: No  . Drug Use: No  . Sexual Activity: Not Asked   Other Topics Concern  . None   Social History Narrative   Retired, married. Walks everyday         Review of Systems  HENT:       Drooling, facial droop.  Neurological: Positive for weakness. Negative for speech difficulty.   Objective:  BP 130/68 mmHg  Pulse 71  Temp(Src) 97.6 F (36.4 C) (Oral)  Ht 5\' 11"  (1.803 m)  Wt 227 lb 6 oz (103.137 kg)  BMI 31.73 kg/m2  SpO2 96%  BP/Weight 09/12/2015 09/12/2015 5/59/7416  Systolic BP 384 536 468  Diastolic BP 78 68 62  Wt. (Lbs) 227 227.38 231  BMI 30.78 31.73 32.23   Physical Exam  Constitutional: He appears well-developed. No distress.  Eyes: EOM are normal. Pupils are equal, round, and reactive to light. No scleral icterus.  Cardiovascular: Normal rate and regular rhythm.   Pulmonary/Chest: Effort normal and breath sounds normal.  Neurological: He is alert.  CN 7 testing revealed weakness with eyelid closing and puffing out of  cheeks. Remainder of Cranial nerves normal. Muscle strength - Weakness of left deltoid. Normal biceps testing bilaterally. Normal LE muscle strength.   Psychiatric: He has a normal mood and affect.  Vitals reviewed.  Lab Results  Component Value Date   WBC 8.0 09/12/2015   HGB 13.0 09/12/2015   HCT 38.5* 09/12/2015   PLT 240 09/12/2015   GLUCOSE 162* 09/12/2015   CHOL 137 07/07/2015   TRIG 107.0 07/07/2015   HDL 40.40 07/07/2015   LDLCALC 75 07/07/2015   ALT 36 09/12/2015   AST 44* 09/12/2015   NA 134* 09/12/2015   K 4.4 09/12/2015   CL 101 09/12/2015   CREATININE 0.84 09/12/2015   BUN 23* 09/12/2015   CO2 27 09/12/2015   TSH 2.96 01/19/2015   PSA 0.52 07/07/2015   HGBA1C 7.5* 07/07/2015   MICROALBUR 2.4* 01/19/2015    Assessment & Plan:   Problem List Items Addressed This Visit    Left arm weakness - Primary    Given weakness, recent facial droop, drooling, and CN exam I am concerned that the patient has had a stroke. I attempted to arrange a stat MRI today but none of our facilities were able to do so.  As a result, I sent him to the ED for imaging.      Relevant  Orders   MR MRA Head/Brain Wo Cm   MR Brain W Wo Contrast    Other Visit Diagnoses    Facial droop        Relevant Orders    MR MRA Head/Brain Wo Cm    MR Brain W Wo Contrast    Drooling        Relevant Orders    MR MRA Head/Brain Wo Cm    MR Brain W Wo Contrast      Follow-up: Sending to ED.  Thersa Salt, DO

## 2015-09-12 NOTE — ED Notes (Signed)
MD Sung at bedside. 

## 2015-09-12 NOTE — Patient Instructions (Signed)
Patient is having findings concerning for stroke.  He needs MRI and MRA.  I attempted to order this as an outpatient (At all [facility] facilities) and was unable to get it today today.  Please work him up for stroke.  [place]

## 2015-09-12 NOTE — ED Notes (Signed)
Pt given lunch tray at this time per verbal order MD Beather Arbour. Pt sitting up in bed eating and tolerating well. No acute distress noted. Vitals wnl, pt verbalized no further needs at this time

## 2015-09-12 NOTE — ED Notes (Signed)
Pt states that he was sent over here by PCP for MRI/CT. Pt states that he has had drooping of left side of face, unable to move arms above chest level X 2 weeks or more. Pt PCP unable to get CT ordered today. PT denies any new symptoms. No slurring speech at this time, face symmetrical. Pt alert and oriented X4, active, cooperative, pt in NAD. RR even and unlabored, color WNL.

## 2015-09-12 NOTE — Assessment & Plan Note (Signed)
Given weakness, recent facial droop, drooling, and CN exam I am concerned that the patient has had a stroke. I attempted to arrange a stat MRI today but none of our facilities were able to do so.  As a result, I sent him to the ED for imaging.

## 2015-09-12 NOTE — Telephone Encounter (Signed)
Message was not addressed & pt is here now to see Dr. Lacinda Axon.

## 2015-09-12 NOTE — Telephone Encounter (Signed)
Pt daughter in law called regarding pt. She states his left lower side of face is dropping and his shoulder is feeling weak. Pt states when he drinks the liquid comes out of his mouth. No appt avail. Pt does not want to see another provider. Pt has had the issue for a week now. WTUUE [phone] call daughter in law. Thank You!

## 2015-09-12 NOTE — Telephone Encounter (Signed)
Pt has been scheduled appt at 3:30pm today.

## 2015-09-13 ENCOUNTER — Telehealth: Payer: Self-pay | Admitting: *Deleted

## 2015-09-13 ENCOUNTER — Observation Stay: Payer: Medicare Other

## 2015-09-13 ENCOUNTER — Observation Stay (HOSPITAL_BASED_OUTPATIENT_CLINIC_OR_DEPARTMENT_OTHER)
Admit: 2015-09-13 | Discharge: 2015-09-13 | Disposition: A | Discharge status: Home / Self Care | Payer: Medicare Other | Attending: Internal Medicine | Admitting: Internal Medicine

## 2015-09-13 DIAGNOSIS — E785 Hyperlipidemia, unspecified: Secondary | ICD-10-CM | POA: Diagnosis not present

## 2015-09-13 DIAGNOSIS — I639 Cerebral infarction, unspecified: Secondary | ICD-10-CM | POA: Diagnosis not present

## 2015-09-13 DIAGNOSIS — G459 Transient cerebral ischemic attack, unspecified: Secondary | ICD-10-CM | POA: Diagnosis not present

## 2015-09-13 DIAGNOSIS — E8881 Metabolic syndrome: Secondary | ICD-10-CM | POA: Diagnosis not present

## 2015-09-13 DIAGNOSIS — R4781 Slurred speech: Secondary | ICD-10-CM | POA: Diagnosis not present

## 2015-09-13 DIAGNOSIS — I10 Essential (primary) hypertension: Secondary | ICD-10-CM | POA: Diagnosis not present

## 2015-09-13 DIAGNOSIS — R531 Weakness: Secondary | ICD-10-CM | POA: Diagnosis not present

## 2015-09-13 HISTORY — DX: Transient cerebral ischemic attack, unspecified: G45.9

## 2015-09-13 LAB — HEMOGLOBIN A1C: Hgb A1c MFr Bld: 6.9 % — ABNORMAL HIGH (ref 4.0–6.0)

## 2015-09-13 LAB — TSH: TSH: 2.693 u[IU]/mL (ref 0.350–4.500)

## 2015-09-13 MED ORDER — CLOPIDOGREL BISULFATE 75 MG PO TABS
75.0000 mg | ORAL_TABLET | Freq: Every day | ORAL | Status: DC
  Administered 2015-09-13: 08:00:00 75 mg via ORAL
  Filled 2015-09-13: qty 1

## 2015-09-13 MED ORDER — ONDANSETRON HCL 4 MG PO TABS: 4.0000 mg | ORAL_TABLET | Freq: Four times a day (QID) | ORAL | Status: DC | PRN

## 2015-09-13 MED ORDER — ACETAMINOPHEN 325 MG PO TABS: 650.0000 mg | ORAL_TABLET | Freq: Four times a day (QID) | ORAL | Status: DC | PRN

## 2015-09-13 MED ORDER — ASPIRIN 81 MG PO CHEW
81.0000 mg | CHEWABLE_TABLET | Freq: Every day | ORAL | Status: DC
  Administered 2015-09-13: 81 mg via ORAL
  Filled 2015-09-13: qty 1

## 2015-09-13 MED ORDER — PRAVASTATIN SODIUM 20 MG PO TABS: 20.0000 mg | ORAL_TABLET | Freq: Every day | ORAL | Status: DC

## 2015-09-13 MED ORDER — DOCUSATE SODIUM 100 MG PO CAPS
100.0000 mg | ORAL_CAPSULE | Freq: Two times a day (BID) | ORAL | Status: DC
  Administered 2015-09-13: 08:00:00 100 mg via ORAL
  Filled 2015-09-13: qty 1

## 2015-09-13 MED ORDER — ONDANSETRON HCL 4 MG/2ML IJ SOLN: 4.0000 mg | Freq: Four times a day (QID) | INTRAMUSCULAR | Status: DC | PRN

## 2015-09-13 MED ORDER — ACETAMINOPHEN 650 MG RE SUPP: 650.0000 mg | Freq: Four times a day (QID) | RECTAL | Status: DC | PRN

## 2015-09-13 MED ORDER — IRBESARTAN 75 MG PO TABS
300.0000 mg | ORAL_TABLET | Freq: Every day | ORAL | Status: DC
  Administered 2015-09-13: 08:00:00 300 mg via ORAL
  Filled 2015-09-13: qty 4

## 2015-09-13 MED ORDER — SODIUM CHLORIDE 0.9 % IV SOLN
INTRAVENOUS | Status: DC
  Administered 2015-09-13: 06:00:00 via INTRAVENOUS

## 2015-09-13 MED ORDER — CYANOCOBALAMIN 1000 MCG/ML IJ SOLN: 1000.0000 ug | INTRAMUSCULAR | Status: DC

## 2015-09-13 MED ORDER — VALSARTAN-HYDROCHLOROTHIAZIDE 320-12.5 MG PO TABS: 1.0000 | ORAL_TABLET | Freq: Every day | ORAL | Status: DC

## 2015-09-13 MED ORDER — CALCIUM CARBONATE-VITAMIN D 500-200 MG-UNIT PO TABS
1.0000 | ORAL_TABLET | Freq: Two times a day (BID) | ORAL | Status: DC
  Administered 2015-09-13: 1 via ORAL
  Filled 2015-09-13: qty 1

## 2015-09-13 MED ORDER — MAGNESIUM OXIDE 400 (241.3 MG) MG PO TABS
400.0000 mg | ORAL_TABLET | Freq: Every day | ORAL | Status: DC
  Administered 2015-09-13: 400 mg via ORAL
  Filled 2015-09-13: qty 1

## 2015-09-13 MED ORDER — HYDROCHLOROTHIAZIDE 12.5 MG PO CAPS
12.5000 mg | ORAL_CAPSULE | Freq: Every day | ORAL | Status: DC
  Administered 2015-09-13: 12.5 mg via ORAL
  Filled 2015-09-13: qty 1

## 2015-09-13 MED ORDER — HEPARIN SODIUM (PORCINE) 5000 UNIT/ML IJ SOLN
5000.0000 [IU] | Freq: Three times a day (TID) | INTRAMUSCULAR | Status: DC
  Administered 2015-09-13: 06:00:00 5000 [IU] via SUBCUTANEOUS
  Filled 2015-09-13 (×2): qty 1

## 2015-09-13 MED ORDER — STROKE: EARLY STAGES OF RECOVERY BOOK
Freq: Once | Status: AC
  Administered 2015-09-13: 06:00:00

## 2015-09-13 NOTE — Discharge Summary (Signed)
[facility] [place] NAME: [name]    MR#:  [id number]  DATE OF BIRTH:  [birth year]  DATE OF ADMISSION:  09/12/2015 ADMITTING PHYSICIAN: Harrie Foreman, MD  DATE OF DISCHARGE: 09/13/2015  4:00 PM  PRIMARY CARE PHYSICIAN: Einar Pheasant, MD    ADMISSION DIAGNOSIS:  Secondary hypertension, unspecified [I15.9] Cerebral infarction due to unspecified mechanism [I63.9]   DISCHARGE DIAGNOSIS:  Left upper extremity weakness  SECONDARY DIAGNOSIS:   Past Medical History  Diagnosis Date  . Diabetes mellitus ([place])   . HTN (hypertension)   . Hypercholesterolemia   . Urinary outflow obstruction     mild  . GERD (gastroesophageal reflux disease)   . BPH (benign prostatic hypertrophy)   . Skin cancer   . Degenerative arthritis   . Allergic rhinitis   . Iron deficiency anemia     HOSPITAL COURSE:   Left upper extremity weakness.  Head CT shows no areas of infarct or acute normality. MRI/MRA of brain didn't show any infarction; echocardiogram showed normal ejection fraction. Per neurologist, Dr. Irish Elders, continue aspirin 81 mg by mouth daily and Lipitor. Per PT and OT evaluation, he needed outpatient OT.  2. Essential hypertension: Continue Diovan HCT 3. Dm2: sliding scale insulin while hospitalized 4. Hyperlipidemia: Continue statin therapy 5. Hyponatremia and mild dehydration: Likely hypovolemic. Given gentle rehydration.  DISCHARGE CONDITIONS:   Stable, discharged to home today.  CONSULTS OBTAINED:  Treatment Team:  Leotis Pain, MD  DRUG ALLERGIES:  No Known Allergies  DISCHARGE MEDICATIONS:   Discharge Medication List as of 09/13/2015 11:17 AM    CONTINUE these medications which have NOT CHANGED   Details  aspirin (ASPIR-LOW) 81 MG EC tablet Take 81 mg by mouth daily.  , Until Discontinued, Historical Med    calcium-vitamin D (OSCAL 500/200 D-3) 500-200 MG-UNIT per tablet Take 1 tablet by mouth 2  (two) times daily. , Until Discontinued, Historical Med    cyanocobalamin (,VITAMIN B-12,) 1000 MCG/ML injection Inject 1,000 mcg into the muscle every 30 (thirty) days., Until Discontinued, Historical Med    docusate sodium (COLACE) 100 MG capsule Take 100 mg by mouth 2 (two) times daily., Until Discontinued, Historical Med    lovastatin (MEVACOR) 20 MG tablet Take 1 tablet (20 mg total) by mouth at bedtime., Starting 11/11/2014, Until Discontinued, Normal    magnesium oxide (MAG-OX) 400 MG tablet Take 400 mg by mouth daily., Until Discontinued, Historical Med    !! valsartan-hydrochlorothiazide (DIOVAN-HCT) 320-12.5 MG tablet Take 1 tablet by mouth daily., Until Discontinued, Historical Med    naproxen sodium (ANAPROX) 220 MG tablet Take 220 mg by mouth as needed., Until Discontinued, Historical Med    ACCU-CHEK SOFTCLIX LANCETS lancets Use as instructed, Normal    glucose blood test strip Use once daily to check blood sugars Dx 250.00, Normal    !! valsartan-hydrochlorothiazide (DIOVAN-HCT) 320-12.5 MG tablet TAKE ONE (1) TABLET EACH DAY, Normal     !! - Potential duplicate medications found. Please discuss with provider.       DISCHARGE INSTRUCTIONS:    If you experience worsening of your admission symptoms, develop shortness of breath, life threatening emergency, suicidal or homicidal thoughts you must seek medical attention immediately by calling 911 or calling your MD immediately  if symptoms less severe.  You Must read complete instructions/literature along with all the possible adverse reactions/side effects for all the Medicines you take and that have been prescribed to you. Take any new Medicines after  you have completely understood and accept all the possible adverse reactions/side effects.   Please note  You were cared for by a hospitalist during your hospital stay. If you have any questions about your discharge medications or the care you received while you were in the  hospital after you are discharged, you can call the unit and asked to speak with the hospitalist on call if the hospitalist that took care of you is not available. Once you are discharged, your primary care physician will handle any further medical issues. Please note that NO REFILLS for any discharge medications will be authorized once you are discharged, as it is imperative that you return to your primary care physician (or establish a relationship with a primary care physician if you do not have one) for your aftercare needs so that they can reassess your need for medications and monitor your lab values.    Today   SUBJECTIVE   No complaint.   VITAL SIGNS:  Blood pressure 134/51, pulse 62, temperature 98.1 F (36.7 C), temperature source Oral, resp. rate 18, height 6' (1.829 m), weight 100.789 kg (222 lb 3.2 oz), SpO2 96 %.  I/O:   Intake/Output Summary (Last 24 hours) at 09/13/15 1831 Last data filed at 09/13/15 1500  Gross per 24 hour  Intake    480 ml  Output    200 ml  Net    280 ml    PHYSICAL EXAMINATION:  GENERAL:  81 y.o.-year-old patient lying in the bed with no acute distress.  EYES: Pupils equal, round, reactive to light and accommodation. No scleral icterus. Extraocular muscles intact.  HEENT: Head atraumatic, normocephalic. Oropharynx and nasopharynx clear.  NECK:  Supple, no jugular venous distention. No thyroid enlargement, no tenderness.  LUNGS: Normal breath sounds bilaterally, no wheezing, rales,rhonchi or crepitation. No use of accessory muscles of respiration.  CARDIOVASCULAR: S1, S2 normal. No murmurs, rubs, or gallops.  ABDOMEN: Soft, non-tender, non-distended. Bowel sounds present. No organomegaly or mass.  EXTREMITIES: No pedal edema, cyanosis, or clubbing.  NEUROLOGIC: Cranial nerves II through XII are intact. Muscle strength 5/5 in all extremities. Sensation intact. Gait not checked.  PSYCHIATRIC: The patient is alert and oriented x 3.  SKIN: No  obvious rash, lesion, or ulcer.   DATA REVIEW:   CBC  Recent Labs Lab 09/12/15 1706  WBC 8.0  HGB 13.0  HCT 38.5*  PLT 240    Chemistries   Recent Labs Lab 09/12/15 1706  NA 134*  K 4.4  CL 101  CO2 27  GLUCOSE 162*  BUN 23*  CREATININE 0.84  CALCIUM 9.4  AST 44*  ALT 36  ALKPHOS 25*  BILITOT 0.8    Cardiac Enzymes  Recent Labs Lab 09/12/15 1706  TROPONINI <0.03    Microbiology Results  Results for orders placed or performed in visit on 05/21/13  Fecal occult blood, imunochemical     Status: None   Collection Time: 05/21/13 12:08 PM  Result Value Ref Range Status   Fecal Occult Bld Negative Negative Final    RADIOLOGY:  Ct Head Wo Contrast  09/12/2015  CLINICAL DATA:  Left facial drooping and inability to move either arm above the chest for at least 2 weeks. Initial encounter. EXAM: CT HEAD WITHOUT CONTRAST TECHNIQUE: Contiguous axial images were obtained from the base of the skull through the vertex without intravenous contrast. COMPARISON:  None. FINDINGS: There is some cortical atrophy. No evidence of acute intracranial abnormality including hemorrhage, infarct, mass lesion, mass effect,  midline shift or abnormal extra-axial fluid collection is identified. No hydrocephalus or pneumocephalus. Mucosal thickening left sphenoid sinus is noted. The calvarium is intact. IMPRESSION: No acute abnormality. Mild atrophy. Mild mucosal thickening left sphenoid sinus. Electronically Signed   By: Inge Rise M.D.   On: 09/12/2015 17:29   Mr Brain Wo Contrast  09/13/2015  CLINICAL DATA:  81 year old hypertensive diabetic male complaining of left upper extremity weakness. Slurred speech. Subsequent encounter. EXAM: MRI HEAD WITHOUT CONTRAST MRA HEAD WITHOUT CONTRAST TECHNIQUE: Multiplanar, multiecho pulse sequences of the brain and surrounding structures were obtained without intravenous contrast. Angiographic images of the head were obtained using MRA technique without  contrast. COMPARISON:  09/12/2015 head CT.  No comparison brain MR. FINDINGS: MRI HEAD FINDINGS No acute infarct. No intracranial hemorrhage. Mild small vessel disease type changes. No intracranial mass lesion noted on this unenhanced exam. Global atrophy without hydrocephalus. Major intracranial vascular structures are patent. Opacification mastoid air cells and middle ear cavities greater on the right. Small cyst-like structure noted in the region of the fossa of Rosenmuller larger on the right which may contribute to eustachian tube dysfunction. Fluid level within the left maxillary sinus. Minimal mucosal thickening ethmoid sinus air cells. Small pituitary gland incidentally noted. Cervical medullary junction and pineal region unremarkable. Post lens replacement otherwise orbital structures unremarkable. MRA HEAD FINDINGS Narrowing versus artifact anterior aspect of the right internal carotid artery cavernous segment. Aplastic/ hypoplastic A1 segment right anterior cerebral artery. Middle cerebral artery mild moderate branch vessel irregularity narrowing bilaterally. Fetal type contribution to the right posterior cerebral artery. Artifact extends through the vertebral arteries and posterior inferior cerebellar artery. Limited for evaluating for possible stenosis in this region. No high-grade stenosis of the basilar artery. Nonvisualized anterior inferior cerebellar artery. Narrowed irregular superior cerebellar arteries. Moderate to marked long segment narrowing right posterior cerebral artery mid aspect. Moderate to marked focal narrowing mid aspect left posterior cerebral artery with distal branch vessel narrowing. No aneurysm noted. IMPRESSION: MRI HEAD No acute infarct. Mild small vessel disease type changes. No intracranial mass lesion noted on this unenhanced exam. Global atrophy without hydrocephalus. Opacification mastoid air cells and middle ear cavities greater on the right. Small cyst-like structure  noted in the region of the fossa of Rosenmuller larger on the right which may contribute to eustachian tube dysfunction. Fluid level within the left maxillary sinus. Minimal mucosal thickening ethmoid sinus air cells. MRA HEAD Narrowing versus artifact anterior aspect of the right internal carotid artery cavernous segment. Aplastic/ hypoplastic A1 segment right anterior cerebral artery. Middle cerebral artery mild to moderate branch vessel irregularity narrowing bilaterally. Artifact extends through the vertebral arteries and posterior inferior cerebellar artery. Limited for evaluating for possible stenosis in this region. No high-grade stenosis of the basilar artery. Nonvisualized anterior inferior cerebellar artery. Narrowed irregular superior cerebellar arteries. Moderate to marked long segment narrowing right posterior cerebral artery mid aspect. Moderate to marked focal narrowing mid aspect left posterior cerebral artery with distal branch vessel narrowing. Electronically Signed   By: Genia Del M.D.   On: 09/13/2015 12:10   Mr Jodene Nam Head/brain Wo Cm  09/13/2015  CLINICAL DATA:  81 year old hypertensive diabetic male complaining of left upper extremity weakness. Slurred speech. Subsequent encounter. EXAM: MRI HEAD WITHOUT CONTRAST MRA HEAD WITHOUT CONTRAST TECHNIQUE: Multiplanar, multiecho pulse sequences of the brain and surrounding structures were obtained without intravenous contrast. Angiographic images of the head were obtained using MRA technique without contrast. COMPARISON:  09/12/2015 head CT.  No comparison brain MR.  FINDINGS: MRI HEAD FINDINGS No acute infarct. No intracranial hemorrhage. Mild small vessel disease type changes. No intracranial mass lesion noted on this unenhanced exam. Global atrophy without hydrocephalus. Major intracranial vascular structures are patent. Opacification mastoid air cells and middle ear cavities greater on the right. Small cyst-like structure noted in the region  of the fossa of Rosenmuller larger on the right which may contribute to eustachian tube dysfunction. Fluid level within the left maxillary sinus. Minimal mucosal thickening ethmoid sinus air cells. Small pituitary gland incidentally noted. Cervical medullary junction and pineal region unremarkable. Post lens replacement otherwise orbital structures unremarkable. MRA HEAD FINDINGS Narrowing versus artifact anterior aspect of the right internal carotid artery cavernous segment. Aplastic/ hypoplastic A1 segment right anterior cerebral artery. Middle cerebral artery mild moderate branch vessel irregularity narrowing bilaterally. Fetal type contribution to the right posterior cerebral artery. Artifact extends through the vertebral arteries and posterior inferior cerebellar artery. Limited for evaluating for possible stenosis in this region. No high-grade stenosis of the basilar artery. Nonvisualized anterior inferior cerebellar artery. Narrowed irregular superior cerebellar arteries. Moderate to marked long segment narrowing right posterior cerebral artery mid aspect. Moderate to marked focal narrowing mid aspect left posterior cerebral artery with distal branch vessel narrowing. No aneurysm noted. IMPRESSION: MRI HEAD No acute infarct. Mild small vessel disease type changes. No intracranial mass lesion noted on this unenhanced exam. Global atrophy without hydrocephalus. Opacification mastoid air cells and middle ear cavities greater on the right. Small cyst-like structure noted in the region of the fossa of Rosenmuller larger on the right which may contribute to eustachian tube dysfunction. Fluid level within the left maxillary sinus. Minimal mucosal thickening ethmoid sinus air cells. MRA HEAD Narrowing versus artifact anterior aspect of the right internal carotid artery cavernous segment. Aplastic/ hypoplastic A1 segment right anterior cerebral artery. Middle cerebral artery mild to moderate branch vessel irregularity  narrowing bilaterally. Artifact extends through the vertebral arteries and posterior inferior cerebellar artery. Limited for evaluating for possible stenosis in this region. No high-grade stenosis of the basilar artery. Nonvisualized anterior inferior cerebellar artery. Narrowed irregular superior cerebellar arteries. Moderate to marked long segment narrowing right posterior cerebral artery mid aspect. Moderate to marked focal narrowing mid aspect left posterior cerebral artery with distal branch vessel narrowing. Electronically Signed   By: Genia Del M.D.   On: 09/13/2015 12:10        Management plans discussed with the patient, family and they are in agreement.  CODE STATUS:     Code Status Orders        Start     Ordered   09/13/15 0520  Full code   Continuous     09/13/15 0519    Advance Directive Documentation        Most Recent Value   Type of Advance Directive  Healthcare Power of Attorney, Living will   Pre-existing out of facility DNR order (yellow form or pink MOST form)     "MOST" Form in Place?       Discussed with the patient and his wife. TOTAL TIME TAKING CARE OF THIS PATIENT: 36 minutes.    Demetrios Loll M.D on 09/13/2015 at 6:31 PM  Between 7am to 6pm - Pager - [phone]  After 6pm go to [email or web address] - password [email or web address] [facility]  Office  [phone]  CC: Primary care physician; Einar Pheasant, MD

## 2015-09-13 NOTE — Evaluation (Signed)
Physical Therapy Evaluation Patient Details Name: [name] MRN: [id number] DOB: 1925-11-16 Today's Date: 09/13/2015   History of Present Illness  Pt is an 81 y.o. male presenting with L UE weakness x2 weeks and has been drooling L corner of mouth for about 1 week.  PMH includes htn, R RCR, DM.  Clinical Impression  Pt initially mildly unsteady with gait (although no loss of balance noted; anticipate d/t decreased mobility during hospital stay) but quickly improved and then pt appeared steady with gait without loss of balance (no AD required).  Pt appears safe to discharge home with support of family as needed.  OT recommending OP OT d/t L UE weakness.  Pt takes care of his wife (per family, pt's wife has Parkinsonism with cognitive impairments) and anticipate pt would benefit from increased assist at home to take care of wife.  Pt's family plans to try to assist taking care of pt's wife so he can attend OP OT appts.  No further PT needs identified during hospital stay or upon discharge; will complete PT order.    Follow Up Recommendations  (OT recommending OP OT (no further PT needs identified))    Equipment Recommendations  None recommended by PT    Recommendations for Other Services       Precautions / Restrictions Precautions Precautions: Fall Restrictions Weight Bearing Restrictions: No      Mobility  Bed Mobility Overal bed mobility: Modified Independent             General bed mobility comments: Supine to/from sit with HOB elevated  Transfers Overall transfer level: Independent Equipment used: None             General transfer comment: Sit to/from stand without loss of balance  Ambulation/Gait Ambulation/Gait assistance:  (initially CGA for safety but pt's balance quickly improved to independent) Ambulation Distance (Feet): 320 Feet Assistive device: None Gait Pattern/deviations: Step-through pattern   Gait velocity interpretation: at or above  normal speed for age/gender General Gait Details: initially mildly unsteady (although no loss of balance) but quickly improved and then pt appearing steady; no loss of balance with head turns R/L/up/down, increasing/decreasing speed  Stairs            Wheelchair Mobility    Modified Rankin (Stroke Patients Only)       Balance Overall balance assessment: Independent                               Standardized Balance Assessment Standardized Balance Assessment :  ([place]:  pt scored 28/28 indicating pt is at low risk for falls)           Pertinent Vitals/Pain Pain Assessment: No/denies pain  Vitals stable and WFL throughout treatment session.    Home Living Family/patient expects to be discharged to:: Private residence Living Arrangements: Spouse/significant other Available Help at Discharge: Family;Available PRN/intermittently Type of Home: House Home Access: Ramped entrance     Home Layout: One level Home Equipment:  (pt's wife has SPC and RW (pt has never used it))      Prior Function Level of Independence: Independent         Comments: Pt is primary caregiver for his wife (pt's family reports pt's wife has Parkinsonism with cognitive impairments and requires 24/7 assist); drives     Hand Dominance        Extremity/Trunk Assessment   Upper Extremity Assessment: Defer to OT evaluation  Lower Extremity Assessment: Overall WFL for tasks assessed (Normal B LE proprioception and coordination)         Communication   Communication: No difficulties  Cognition Arousal/Alertness: Awake/alert Behavior During Therapy: WFL for tasks assessed/performed Overall Cognitive Status: Within Functional Limits for tasks assessed                      General Comments   Nursing cleared pt for participation in physical therapy.  Pt agreeable to PT session. Pt's daughter-in-law and grand-daughter present during session.     Exercises        Assessment/Plan    PT Assessment Patent does not need any further PT services  PT Diagnosis     PT Problem List    PT Treatment Interventions     PT Goals (Current goals can be found in the Care Plan section) Acute Rehab PT Goals Patient Stated Goal: to go home PT Goal Formulation: With patient/family Time For Goal Achievement: 09/27/15 Potential to Achieve Goals: Good    Frequency     Barriers to discharge        Co-evaluation               End of Session Equipment Utilized During Treatment: Gait belt Activity Tolerance: Patient tolerated treatment well Patient left: in bed;with call bell/phone within reach;with bed alarm set;with family/visitor present Nurse Communication: Mobility status         Time: 1335-1400 PT Time Calculation (min) (ACUTE ONLY): 25 min   Charges:   PT Evaluation $Initial PT Evaluation Tier I: 1 Procedure     PT G CodesLeitha Bleak September 22, 2015, 3:18 PM Leitha Bleak, [place]

## 2015-09-13 NOTE — Plan of Care (Signed)
Pt admitted for possible TIA.  Had been experiencing LUE weakness for 2 wks and having some drooling from L side for 1 wk.  Still experiencing difficulty to raise arms above shoulder.  CT/MRI negative for acute infarct.  Pt wked w/PT and OT and outpt OT recommended.  Did teaching on risk factors/and how to possibly prevent stroke w/diet and activity.  IV removed by nurse tech.  Pt left via wheelchair w/family.

## 2015-09-13 NOTE — Progress Notes (Signed)
*  PRELIMINARY RESULTS* Echocardiogram 2D Echocardiogram has been performed.  [name] 09/13/2015, 11:46 AM

## 2015-09-13 NOTE — Consult Note (Signed)
CC: L sided weakness   HPI: [name] is an 81 y.o. male presents emergency department complaining of left upper extremity weakness. He states that he has not been able to lift his left arm above the level of his chest for the last 2 weeks area initially there was a report of slurred speech. Pt's symptoms come and go.   Past Medical History  Diagnosis Date  . Diabetes mellitus ([place])   . HTN (hypertension)   . Hypercholesterolemia   . Urinary outflow obstruction     mild  . GERD (gastroesophageal reflux disease)   . BPH (benign prostatic hypertrophy)   . Skin cancer   . Degenerative arthritis   . Allergic rhinitis   . Iron deficiency anemia     Past Surgical History  Procedure Laterality Date  . Pilonidal cyst excision  1951    removal  . Rotator cuff repair  1995  . Tonsillectomy and adenoidectomy  1938  . Skin cancer excision      multiple    Family History  Problem Relation Age of Onset  . Heart disease Father     heart atack  . Alzheimer's disease Mother   . CVA Brother     Social History:  reports that he has never smoked. He has never used smokeless tobacco. He reports that he does not drink alcohol or use illicit drugs.  No Known Allergies  Medications: I have reviewed the patient's current medications.  ROS: History obtained from the patient  General ROS: negative for - chills, fatigue, fever, night sweats, weight gain or weight loss Psychological ROS: negative for - behavioral disorder, hallucinations, memory difficulties, mood swings or suicidal ideation Ophthalmic ROS: negative for - blurry vision, double vision, eye pain or loss of vision ENT ROS: negative for - epistaxis, nasal discharge, oral lesions, sore throat, tinnitus or vertigo Allergy and Immunology ROS: negative for - hives or itchy/watery eyes Hematological and Lymphatic ROS: negative for - bleeding problems, bruising or swollen lymph nodes Endocrine ROS: negative for -  galactorrhea, hair pattern changes, polydipsia/polyuria or temperature intolerance Respiratory ROS: negative for - cough, hemoptysis, shortness of breath or wheezing Cardiovascular ROS: negative for - chest pain, dyspnea on exertion, edema or irregular heartbeat Gastrointestinal ROS: negative for - abdominal pain, diarrhea, hematemesis, nausea/vomiting or stool incontinence Genito-Urinary ROS: negative for - dysuria, hematuria, incontinence or urinary frequency/urgency Musculoskeletal ROS: negative for - joint swelling or muscular weakness Neurological ROS: as noted in HPI Dermatological ROS: negative for rash and skin lesion changes  Physical Examination: Blood pressure 145/57, pulse 64, temperature 97.7 F (36.5 C), temperature source Oral, resp. rate 18, height 6' (1.829 m), weight 222 lb 3.2 oz (100.789 kg), SpO2 97 %.   Neurological Examination Mental Status: Mild dysarthria  Cranial Nerves: II: Discs flat bilaterally; Visual fields grossly normal, pupils equal, round, reactive to light and accommodation III,IV, VI: ptosis not present, extra-ocular motions intact bilaterally V,VII: ? L facial droop VIII: hearing normal bilaterally IX,X: gag reflex present XI: bilateral shoulder shrug XII: midline tongue extension Motor: Right : Upper extremity   5/5    Left:     Upper extremity   4/5 in the left deltoid and tricepts  Lower extremity   5/5     Lower extremity   5/5 Tone and bulk:normal tone throughout; no atrophy noted Sensory: Pinprick and light touch intact throughout, bilaterally Deep Tendon Reflexes: 2+ and symmetric throughout Plantars: Right: downgoing   Left: downgoing Cerebellar: normal finger-to-nose,  normal rapid alternating movements and normal heel-to-shin test Gait: not tested     Laboratory Studies:   Basic Metabolic Panel:  Recent Labs Lab 09/12/15 1706  NA 134*  K 4.4  CL 101  CO2 27  GLUCOSE 162*  BUN 23*  CREATININE 0.84  CALCIUM 9.4     Liver Function Tests:  Recent Labs Lab 09/12/15 1706  AST 44*  ALT 36  ALKPHOS 25*  BILITOT 0.8  PROT 8.0  ALBUMIN 4.0   No results for input(s): LIPASE, AMYLASE in the last 168 hours. No results for input(s): AMMONIA in the last 168 hours.  CBC:  Recent Labs Lab 09/12/15 1706  WBC 8.0  HGB 13.0  HCT 38.5*  MCV 93.5  PLT 240    Cardiac Enzymes:  Recent Labs Lab 09/12/15 1706  TROPONINI <0.03    BNP: Invalid input(s): POCBNP  CBG: No results for input(s): GLUCAP in the last 168 hours.  Microbiology: Results for orders placed or performed in visit on 05/21/13  Fecal occult blood, imunochemical     Status: None   Collection Time: 05/21/13 12:08 PM  Result Value Ref Range Status   Fecal Occult Bld Negative Negative Final    Coagulation Studies: No results for input(s): LABPROT, INR in the last 72 hours.  Urinalysis: No results for input(s): COLORURINE, LABSPEC, PHURINE, GLUCOSEU, HGBUR, BILIRUBINUR, KETONESUR, PROTEINUR, UROBILINOGEN, NITRITE, LEUKOCYTESUR in the last 168 hours.  Invalid input(s): APPERANCEUR  Lipid Panel:     Component Value Date/Time   CHOL 137 07/07/2015 0757   TRIG 107.0 07/07/2015 0757   HDL 40.40 07/07/2015 0757   CHOLHDL 3 07/07/2015 0757   VLDL 21.4 07/07/2015 0757   LDLCALC 75 07/07/2015 0757    HgbA1C:  Lab Results  Component Value Date   HGBA1C 7.5* 07/07/2015    Urine Drug Screen:  No results found for: LABOPIA, COCAINSCRNUR, LABBENZ, AMPHETMU, THCU, LABBARB  Alcohol Level: No results for input(s): ETH in the last 168 hours.  Other results: EKG: normal EKG, normal sinus rhythm, unchanged from previous tracings.  Imaging: Ct Head Wo Contrast  09/12/2015  CLINICAL DATA:  Left facial drooping and inability to move either arm above the chest for at least 2 weeks. Initial encounter. EXAM: CT HEAD WITHOUT CONTRAST TECHNIQUE: Contiguous axial images were obtained from the base of the skull through the vertex  without intravenous contrast. COMPARISON:  None. FINDINGS: There is some cortical atrophy. No evidence of acute intracranial abnormality including hemorrhage, infarct, mass lesion, mass effect, midline shift or abnormal extra-axial fluid collection is identified. No hydrocephalus or pneumocephalus. Mucosal thickening left sphenoid sinus is noted. The calvarium is intact. IMPRESSION: No acute abnormality. Mild atrophy. Mild mucosal thickening left sphenoid sinus. Electronically Signed   By: Inge Rise M.D.   On: 09/12/2015 17:29     Assessment/Plan:  81 y.o. male presents emergency department complaining of left upper extremity weakness. He states that he has not been able to lift his left arm above the level of his chest for the last 2 weeks area initially there was a report of slurred speech. Pt's symptoms come and go.   - Possibly small subcortical R sided stroke but not convinced as symptoms come and go and CtH negative after 2 weeks of symptoms  - Agree with MRI brain, if positive for acute ischemia increase ASA to 325 daily, - If negative MRI brain then EMG as out pt as only has deltoid and triceps weakness - d/c today   Meliah Appleman,  Alease Frame

## 2015-09-13 NOTE — Discharge Instructions (Signed)
Heart healthy diet. Activity as tolerated. EMG as out pt  Outpatient OT

## 2015-09-13 NOTE — Evaluation (Addendum)
Occupational Therapy Evaluation Patient Details Name: [name] MRN: [id number] DOB: 13-Oct-1926 Today's Date: 09/13/2015    History of Present Illness Pt is an 81 y.o. male presenting with L UE weakness x2 weeks and has been drooling L corner of mouth for about 1 week.  PMH includes htn, R RCR, DM.   Clinical Impression   This patient is an 81 year old male who came to [facility] with the above problems. He lives with his wife who he cares for. He has bilateral shoulder deficits R from rotator cuff injury and left recent as above.  He would benefit from Occupational Therapy for upper extremity restoration and activities of daily living training.    Follow Up Recommendations  Outpatient OT (And care giver to help care for wife.)    Equipment Recommendations       Recommendations for Other Services       Precautions / Restrictions Precautions Precautions: Fall Restrictions Weight Bearing Restrictions: No      Mobility Bed Mobility Overal bed mobility: Modified Independent             General bed mobility comments: Supine to/from sit with HOB elevated  Transfers Overall transfer level: Independent Equipment used: None                Balance Overall balance assessment: Independent (Per PT)                                        ADL                                         General ADL Comments: Patient had been independent with ADL and cares for his wife. He now has some trouble donning shirts but can button buttons and dress lower body.     Vision     Perception     Praxis      Pertinent Vitals/Pain Pain Assessment: No/denies pain     Hand Dominance Right   Extremity/Trunk Assessment Upper Extremity Assessment Upper Extremity Assessment: RUE deficits/detail;LUE deficits/detail (B UE sensation normal for light touch, temp, stereognosis, and sharp.) RUE Deficits / Details: R UE shoulder flexion limited to  80o (rotator cuff repair)  strength 3-/5 distal motions are WNL,  distal motions 5/5 grip 68 lbs   LUE Deficits / Details:  L UE shoulder flexion 40o distal motions are WNL strength triceps 4/5 bicep, wrist and forearm 5/5 grip  73 lbs.    Lower Extremity Assessment Lower Extremity Assessment: Defer to PT evaluation       Communication Communication Communication: No difficulties   Cognition Arousal/Alertness: Awake/alert Behavior During Therapy: WFL for tasks assessed/performed Overall Cognitive Status: Within Functional Limits for tasks assessed                     General Comments       Exercises       Shoulder Instructions      Home Living Family/patient expects to be discharged to:: Private residence Living Arrangements: Spouse/significant other (Family reports that patient cares for his wife with dementia.) Available Help at Discharge: Family;Available PRN/intermittently Type of Home: House Home Access: Ramped entrance     Home Layout: One level  Home Equipment:  (pt's wife has SPC and RW (pt has never used it))          Prior Functioning/Environment Level of Independence: Independent        Comments: Pt is primary caregiver for his wife (pt's family reports pt's wife has Parkinsonism with cognitive impairments and requires 24/7 assist); drives    OT Diagnosis: Generalized weakness   OT Problem List: Decreased strength;Decreased range of motion   OT Treatment/Interventions: Neuromuscular education;Self-care/ADL training    OT Goals(Current goals can be found in the care plan section) Acute Rehab OT Goals Patient Stated Goal: to go home OT Goal Formulation: With patient/family Time For Goal Achievement: 09/27/15 Potential to Achieve Goals: Good  OT Frequency: Min 1X/week   Barriers to D/C:            Co-evaluation              End of Session Equipment Utilized During Treatment:  (stroke test kit.)  Activity  Tolerance:   Patient left:     Time: 0518-3358 OT Time Calculation (min): 23 min Charges:  OT General Charges $OT Visit: 1 Procedure OT Evaluation $Initial OT Evaluation Tier I: 1 Procedure G-Codes: OT G-codes **NOT FOR INPATIENT CLASS** Functional Assessment Tool Used: clinical judgment Functional Limitation: Self care Self Care Goal Status ([id number]): At least 1 percent but less than 20 percent impaired, limited or restricted Self Care Discharge Status [phone]): At least 40 percent but less than 60 percent impaired, limited or restricted  Myrene Galas, MS/OTR/L  09/13/2015, 4:01 PM

## 2015-09-13 NOTE — Evaluation (Signed)
Clinical/Bedside Swallow Evaluation Patient Details  Name: JONNY DEARDEN MRN: [id number] Date of Birth: May 25, 1926  Today's Date: 09/13/2015 Time: SLP Start Time (ACUTE ONLY): 0830 SLP Stop Time (ACUTE ONLY): 0900 SLP Time Calculation (min) (ACUTE ONLY): 30 min  Past Medical History:  Past Medical History  Diagnosis Date  . Diabetes mellitus ([place])   . HTN (hypertension)   . Hypercholesterolemia   . Urinary outflow obstruction     mild  . GERD (gastroesophageal reflux disease)   . BPH (benign prostatic hypertrophy)   . Skin cancer   . Degenerative arthritis   . Allergic rhinitis   . Iron deficiency anemia    Past Surgical History:  Past Surgical History  Procedure Laterality Date  . Pilonidal cyst excision  1951    removal  . Rotator cuff repair  1995  . Tonsillectomy and adenoidectomy  1938  . Skin cancer excision      multiple   HPI:  Pt reported no concerns with speech, language or swallowing and states he's at his baseline with swallowing. HPI: HPI: Patient presents emergency department complaining of left upper extremity weakness. He states that he has not been able to lift his left arm above the level of his chest for the last 2 weeks area initially there was a report of slurred speech although his son clarifies this to indicate that he has had some drooling from the left corner of his mouth for approximately 6 months or more. He has seen his primary care doctor about this problem but has been unable to obtain an MRI due to unavailability of outpatient imaging. In the emergency department he was given aspirin after which the emergency department staff called for admission.   Assessment / Plan / Recommendation Clinical Impression  Pt presented with no immediate or overt s/s of aspiration during PO trials and no reported s/s of aspiration with breakfast this morning. ST observed appropriate speech that was clear and coherent as well as appropriate oral phase in bolus  management. Pt consumed 3 trials of ice chips, 9 trials of water and 6 trials of graham crackers with ST and did not display any overt or immediate s/s of aspiration across all trials. Two throat clears were observed during visit, but did not appear related or in response to PO trials.  When asked, Pt stated that throat clearing was not due to anything feeling like it was going down "the wrong way." Pt can appropriately communicate wants and needs. Pt informed that ST services are available should any f/u be necessary. ST recommends Pt to continue recieving a regular diet with thin liquids at this time. NSG updated.    Aspiration Risk   (reduced)    Diet Recommendation Age appropriate regular solids;Thin   Medication Administration: Whole meds with liquid    Other  Recommendations Oral Care Recommendations: Oral care BID;Patient independent with oral care   Follow Up Recommendations       Frequency and Duration        Pertinent Vitals/Pain None Reported    SLP Swallow Goals  N/A   Swallow Study Prior Functional Status   Pt eat a regular diet with thin liquids at baseline    General Date of Onset: 09/12/15 Other Pertinent Information: Pt reported no concerns with speech, language or swallowing and states he's at his baseline with swallowing. HPI: HPI: Patient presents emergency department complaining of left upper extremity weakness. He states that he has not been able to lift his left  arm above the level of his chest for the last 2 weeks area initially there was a report of slurred speech although his son clarifies this to indicate that he has had some drooling from the left corner of his mouth for approximately 6 months or more. He has seen his primary care doctor about this problem but has been unable to obtain an MRI due to unavailability of outpatient imaging. In the emergency department he was given aspirin after which the emergency department staff called for admission. Type of  Study: Bedside swallow evaluation Diet Prior to this Study: Regular;Thin liquids Temperature Spikes Noted: No Respiratory Status: Room air History of Recent Intubation: No Behavior/Cognition: Alert;Cooperative;Pleasant mood Oral Cavity - Dentition: Adequate natural dentition/normal for age Self-Feeding Abilities: Able to feed self Patient Positioning: Upright in bed Baseline Vocal Quality: Normal Volitional Swallow: Able to elicit    Oral/Motor/Sensory Function Overall Oral Motor/Sensory Function: Appears within functional limits for tasks assessed (as observed in bolus management)   Ice Chips Ice chips: Within functional limits Presentation: Spoon Other Comments: Pt consumed 3 ice chips with no immediate or overt s/s of aspiration   Thin Liquid Thin Liquid: Within functional limits Presentation: Cup Other Comments: Pt consumed 9 trials of water by cup with no immediate or overt s/s of aspiration.    Nectar Thick Nectar Thick Liquid: Not tested   Honey Thick Honey Thick Liquid: Not tested   Puree Puree: Not tested   Solid   GO    Solid: Within functional limits Presentation: Self Fed Other Comments: Pt consumed 6 trials of graham cracker and his entire breakfast tray with no overt or immediate s/s of aspiration (observed and resported)       Felica Chargois 09/13/2015,9:07 AM

## 2015-09-13 NOTE — Care Management Obs Status (Signed)
[place] NOTIFICATION   Patient Details  Name: DAYVION SANS MRN: [id number] Date of Birth: [birth year]   Medicare Observation Status Notification Given:  Yes    Shelbie Ammons, RN 09/13/2015, 11:32 AM

## 2015-09-13 NOTE — H&P (Signed)
[name] is an 81 y.o. male.   Chief Complaint: Weakness HPI: Patient presents emergency department complaining of left upper extremity weakness. He states that he has not been able to lift his left arm above the level of his chest for the last 2 weeks area initially there was a report of slurred speech although his son clarifies this to indicate that he has had some drooling from the left corner of his mouth for approximately 6 months or more. He has seen his primary care doctor about this problem but has been unable to obtain an MRI due to unavailability of outpatient imaging. In the emergency department he was given aspirin after which the emergency department staff called for admission.  Past Medical History  Diagnosis Date  . Diabetes mellitus ([place])   . HTN (hypertension)   . Hypercholesterolemia   . Urinary outflow obstruction     mild  . GERD (gastroesophageal reflux disease)   . BPH (benign prostatic hypertrophy)   . Skin cancer   . Degenerative arthritis   . Allergic rhinitis   . Iron deficiency anemia     Past Surgical History  Procedure Laterality Date  . Pilonidal cyst excision  1951    removal  . Rotator cuff repair  1995  . Tonsillectomy and adenoidectomy  1938  . Skin cancer excision      multiple    Family History  Problem Relation Age of Onset  . Heart disease Father     heart atack  . Alzheimer's disease Mother   . CVA Brother    Social History:  reports that he has never smoked. He has never used smokeless tobacco. He reports that he does not drink alcohol or use illicit drugs.  Allergies: No Known Allergies  Medications Prior to Admission  Medication Sig Dispense Refill  . aspirin (ASPIR-LOW) 81 MG EC tablet Take 81 mg by mouth daily.      . calcium-vitamin D (OSCAL 500/200 D-3) 500-200 MG-UNIT per tablet Take 1 tablet by mouth 2 (two) times daily.     . cyanocobalamin (,VITAMIN B-12,) 1000 MCG/ML injection Inject 1,000 mcg into the muscle  every 30 (thirty) days.    Marland Kitchen docusate sodium (COLACE) 100 MG capsule Take 100 mg by mouth 2 (two) times daily.    Marland Kitchen lovastatin (MEVACOR) 20 MG tablet Take 1 tablet (20 mg total) by mouth at bedtime. (Patient taking differently: Take 10 mg by mouth at bedtime. ) 90 tablet 1  . magnesium oxide (MAG-OX) 400 MG tablet Take 400 mg by mouth daily.    . naproxen sodium (ANAPROX) 220 MG tablet Take 220 mg by mouth as needed.    . valsartan-hydrochlorothiazide (DIOVAN-HCT) 320-12.5 MG tablet Take 1 tablet by mouth daily.    Marland Kitchen ACCU-CHEK SOFTCLIX LANCETS lancets Use as instructed 100 each 12  . glucose blood test strip Use once daily to check blood sugars Dx 250.00 100 each 3  . valsartan-hydrochlorothiazide (DIOVAN-HCT) 320-12.5 MG tablet TAKE ONE (1) TABLET EACH DAY 90 tablet 2    Results for orders placed or performed during the hospital encounter of 09/12/15 (from the past 48 hour(s))  CBC     Status: Abnormal   Collection Time: 09/12/15  5:06 PM  Result Value Ref Range   WBC 8.0 3.8 - 10.6 K/uL   RBC 4.12 (L) 4.40 - 5.90 MIL/uL   Hemoglobin 13.0 13.0 - 18.0 g/dL   HCT 38.5 (L) 40.0 - 52.0 %   MCV 93.5 80.0 -  100.0 fL   MCH 31.6 26.0 - 34.0 pg   MCHC 33.8 32.0 - 36.0 g/dL   RDW 12.7 11.5 - 14.5 %   Platelets 240 150 - 440 K/uL  Comprehensive metabolic panel     Status: Abnormal   Collection Time: 09/12/15  5:06 PM  Result Value Ref Range   Sodium 134 (L) 135 - 145 mmol/L   Potassium 4.4 3.5 - 5.1 mmol/L   Chloride 101 101 - 111 mmol/L   CO2 27 22 - 32 mmol/L   Glucose, Bld 162 (H) 65 - 99 mg/dL   BUN 23 (H) 6 - 20 mg/dL   Creatinine, Ser 0.84 0.61 - 1.24 mg/dL   Calcium 9.4 8.9 - 10.3 mg/dL   Total Protein 8.0 6.5 - 8.1 g/dL   Albumin 4.0 3.5 - 5.0 g/dL   AST 44 (H) 15 - 41 U/L   ALT 36 17 - 63 U/L   Alkaline Phosphatase 25 (L) 38 - 126 U/L   Total Bilirubin 0.8 0.3 - 1.2 mg/dL   GFR calc non Af Amer >60 >60 mL/min   GFR calc Af Amer >60 >60 mL/min    Comment: (NOTE) The eGFR has  been calculated using the CKD EPI equation. This calculation has not been validated in all clinical situations. eGFR's persistently <60 mL/min signify possible Chronic Kidney Disease.    Anion gap 6 5 - 15  Troponin I     Status: None   Collection Time: 09/12/15  5:06 PM  Result Value Ref Range   Troponin I <0.03 <0.031 ng/mL    Comment:        NO INDICATION OF MYOCARDIAL INJURY.    Ct Head Wo Contrast  09/12/2015  CLINICAL DATA:  Left facial drooping and inability to move either arm above the chest for at least 2 weeks. Initial encounter. EXAM: CT HEAD WITHOUT CONTRAST TECHNIQUE: Contiguous axial images were obtained from the base of the skull through the vertex without intravenous contrast. COMPARISON:  None. FINDINGS: There is some cortical atrophy. No evidence of acute intracranial abnormality including hemorrhage, infarct, mass lesion, mass effect, midline shift or abnormal extra-axial fluid collection is identified. No hydrocephalus or pneumocephalus. Mucosal thickening left sphenoid sinus is noted. The calvarium is intact. IMPRESSION: No acute abnormality. Mild atrophy. Mild mucosal thickening left sphenoid sinus. Electronically Signed   By: Inge Rise M.D.   On: 09/12/2015 17:29    Review of Systems  Constitutional: Negative for fever and chills.  HENT: Negative for sore throat and tinnitus.   Eyes: Negative for blurred vision and redness.  Respiratory: Negative for cough and shortness of breath.   Cardiovascular: Negative for chest pain, palpitations, orthopnea and PND.  Gastrointestinal: Negative for nausea, vomiting, abdominal pain and diarrhea.  Genitourinary: Negative for dysuria, urgency and frequency.  Musculoskeletal: Negative for myalgias and joint pain.  Skin: Negative for rash.       No lesions  Neurological: Positive for sensory change and focal weakness. Negative for speech change and weakness.  Endo/Heme/Allergies: Does not bruise/bleed easily.       No  temperature intolerance  Psychiatric/Behavioral: Negative for depression and suicidal ideas.    Blood pressure 145/57, pulse 64, temperature 97.7 F (36.5 C), temperature source Oral, resp. rate 18, height 6' (1.829 m), weight 100.789 kg (222 lb 3.2 oz), SpO2 97 %. Physical Exam  Nursing note and vitals reviewed. Constitutional: He is oriented to person, place, and time. He appears well-developed and well-nourished. No distress.  HENT:  Head: Normocephalic and atraumatic.  Mouth/Throat: No oropharyngeal exudate.  Eyes: Conjunctivae and EOM are normal. Pupils are equal, round, and reactive to light. No scleral icterus.  Neck: Normal range of motion. Neck supple. No JVD present. No tracheal deviation present. No thyromegaly present.  Cardiovascular: Normal rate, regular rhythm and normal heart sounds.  Exam reveals no gallop and no friction rub.   No murmur heard. Respiratory: Effort normal and breath sounds normal.  GI: Soft. Bowel sounds are normal. He exhibits no distension. There is no tenderness.  Genitourinary:  Deferred  Musculoskeletal: Normal range of motion. He exhibits no edema.  Lymphadenopathy:    He has no cervical adenopathy.  Neurological: He is alert and oriented to person, place, and time. A cranial nerve deficit is present.  Left-sided facial droop  Skin: Skin is warm and dry. No rash noted. No erythema.  Psychiatric: He has a normal mood and affect. His behavior is normal. Judgment and thought content normal.     Assessment/Plan This is an 81 year old Caucasian male admitted for left upper extremity weakness secondary to cerebral vascular accident. 1. CVA: Late presentation: The patient's symptoms actually began months ago although I am concerned that it appears there has been evolution of his neurologic deficits. Head CT shows no areas of infarct or acute normality. I have ordered an MRI/MRA of his head as well as echocardiogram. I place a neurology consult and start  the patient on Plavix. 2. Essential hypertension: Continue Diovan HCT 3. Metabolic syndrome: Check hemoglobin A1c; slight scale insulin while hospitalized 4. Hyperlipidemia: Continue statin therapy 5. Hyponatremia: Likely hypovolemic. Gentle intravenous duration  6. DVT prophylaxis: Heparin 7. GI prophylaxis: None The patient is a full code. Time spent on admission orders and patient care approximately 45 minutes  Harrie Foreman 09/13/2015, 6:37 AM

## 2015-09-13 NOTE — ED Notes (Signed)
Admitting MD at bedside.

## 2015-09-13 NOTE — Telephone Encounter (Signed)
Patient has discharged from [facility] today, with stroke like symptoms. Patient will need a appt, please advise a place on Dr. Bary Leriche schedule.  Patient was also needed orders for physical therapy and occupational therapy.

## 2015-09-15 ENCOUNTER — Telehealth: Payer: Self-pay

## 2015-09-15 NOTE — Telephone Encounter (Signed)
Transition Care Management Follow-up Telephone Call   Date discharged? 09/13/15   How have you been since you were released from the hospital? Everything is going just fine, but I can't raise arms above my head.  The left arm is worse than the right.  No pain, just weakness.   Do you understand why you were in the hospital? Yes   Do you understand the discharge instructions? Yes   Where were you discharged to? Home   Items Reviewed:  Medications reviewed: Yes  Allergies reviewed: Yes, no change.  Dietary changes reviewed: Yes, no change.  Referrals reviewed: Yes.  Needs a referral to neurology.    Functional Questionnaire:   Activities of Daily Living (ADLs):   He states they are independent in the following: Independent in all ADLs but some assistance from wife when reaching above the head. States they require assistance with the following: Wife to assist only when reaching above the head.   Any transportation issues/concerns?: None.   Any patient concerns? Saliva dripping from mouth, drooping lips, no feeling in lower lip, thick feeling in lips when speaking at times.  Did I have a stroke?   Confirmed importance and date/time of follow-up visits scheduled Yes, appointment made for 09-19-15 at 130.  Provider Appointment booked with  Dr. Nicki Reaper (PCP).  Confirmed with patient if condition begins to worsen call PCP or go to the ER.  Patient was given the office number and encouraged to call back with question or concerns.  : Yes, patient verbalized understanding.

## 2015-09-15 NOTE — Telephone Encounter (Signed)
I have sent [name] a note to schedule pt for hospital f/u on 09/19/15.  [place] for PT and OT.  Thanks

## 2015-09-16 ENCOUNTER — Telehealth: Payer: Self-pay

## 2015-09-16 ENCOUNTER — Telehealth: Payer: Self-pay | Admitting: *Deleted

## 2015-09-16 NOTE — Telephone Encounter (Signed)
Patient requested a call back, patient stated that he missed a call .

## 2015-09-16 NOTE — Telephone Encounter (Signed)
Thank you. Will follow up. 

## 2015-09-16 NOTE — Telephone Encounter (Signed)
Called patient and left a message to call the office back in regards to a neurology appointment.  If he doesn't want to travel to [place], we can schedule an appointment with Dr. Manuella Ghazi at [place], per Dr. Nicki Reaper.

## 2015-09-16 NOTE — Telephone Encounter (Signed)
Patient is OK with seeing Dr. Manuella Ghazi.

## 2015-09-17 ENCOUNTER — Other Ambulatory Visit: Payer: Self-pay | Admitting: Internal Medicine

## 2015-09-17 DIAGNOSIS — R29898 Other symptoms and signs involving the musculoskeletal system: Secondary | ICD-10-CM

## 2015-09-17 DIAGNOSIS — R29810 Facial weakness: Secondary | ICD-10-CM

## 2015-09-17 NOTE — Telephone Encounter (Signed)
Order placed for neurology referral.   

## 2015-09-17 NOTE — Progress Notes (Signed)
Order placed for neurology referral.   

## 2015-09-19 ENCOUNTER — Ambulatory Visit (INDEPENDENT_AMBULATORY_CARE_PROVIDER_SITE_OTHER): Payer: Medicare Other | Admitting: Internal Medicine

## 2015-09-19 ENCOUNTER — Encounter: Payer: Self-pay | Admitting: Internal Medicine

## 2015-09-19 VITALS — BP 130/60 | HR 70 | Temp 97.8°F | Resp 18 | Ht 71.0 in | Wt 227.0 lb

## 2015-09-19 DIAGNOSIS — R29810 Facial weakness: Secondary | ICD-10-CM | POA: Diagnosis not present

## 2015-09-19 DIAGNOSIS — E119 Type 2 diabetes mellitus without complications: Secondary | ICD-10-CM

## 2015-09-19 DIAGNOSIS — I639 Cerebral infarction, unspecified: Secondary | ICD-10-CM

## 2015-09-19 DIAGNOSIS — I10 Essential (primary) hypertension: Secondary | ICD-10-CM

## 2015-09-19 DIAGNOSIS — R29898 Other symptoms and signs involving the musculoskeletal system: Secondary | ICD-10-CM | POA: Diagnosis not present

## 2015-09-19 MED ORDER — PYRIDOSTIGMINE BROMIDE 60 MG PO TABS: ORAL_TABLET | ORAL | Status: DC

## 2015-09-19 NOTE — Progress Notes (Signed)
Patient ID: [name], male   DOB: 1926-08-01, 81 y.o.   MRN: [id number]   Subjective:    Patient ID: [name], male    DOB: 1926/04/23, 81 y.o.   MRN: [id number]  HPI  Patient with past history of hypercholesterolemia, diabetes, hypertension and GERD who comes in today for a hospital follow up.  Was admitted on 09/12/15 with left facial droop and left arm weakness.  See 09/12/15 note and hospital records for details.  Neurology evaluated pt.  Had CT, MRI/MRA.  Unrevealing of acute stroke.  Had suggested outpatient neurology f/u.  On questioning him, he reports noticing some drooling over the summer - just out of the left corner of his mouth.  States that 2-3 weeks ago, noticed he could not lift his arms as well.  Left arm is worse than right, but both are affected.  Reports that when he is chewing his food - that by the time he is 1/2 way through his sandwich - he feels he cannot bite the meat.  The longer he talks, the more slurred his speech.  Does have a left facial droop.  Also has noticed inability to lift his arms over his head or reach around his back.  Has had issues with double vision recently and opthalmology placed a prism in his glasses.  He has not noticed weakness in his legs, but does report that his steps are "not as solid".  No falls.  Stools not as formed, but no actual diarrhea or increased frequency.  No headache.     Past Medical History  Diagnosis Date  . Diabetes mellitus ([place])   . HTN (hypertension)   . Hypercholesterolemia   . Urinary outflow obstruction     mild  . GERD (gastroesophageal reflux disease)   . BPH (benign prostatic hypertrophy)   . Skin cancer   . Degenerative arthritis   . Allergic rhinitis   . Iron deficiency anemia    Past Surgical History  Procedure Laterality Date  . Pilonidal cyst excision  1951    removal  . Rotator cuff repair  1995  . Tonsillectomy and adenoidectomy  1938  . Skin cancer excision      multiple    Family History  Problem Relation Age of Onset  . Heart disease Father     heart atack  . Alzheimer's disease Mother   . CVA Brother    Social History   Social History  . Marital Status: Married    Spouse Name: N/A  . Number of Children: N/A  . Years of Education: N/A   Social History Main Topics  . Smoking status: Never Smoker   . Smokeless tobacco: Never Used  . Alcohol Use: No  . Drug Use: No  . Sexual Activity: Not Asked   Other Topics Concern  . None   Social History Narrative   Retired, married. Walks everyday          Outpatient Encounter Prescriptions as of 09/19/2015  Medication Sig  . ACCU-CHEK SOFTCLIX LANCETS lancets Use as instructed  . aspirin (ASPIR-LOW) 81 MG EC tablet Take 81 mg by mouth daily.    . calcium-vitamin D (OSCAL 500/200 D-3) 500-200 MG-UNIT per tablet Take 1 tablet by mouth 2 (two) times daily.   . cyanocobalamin (,VITAMIN B-12,) 1000 MCG/ML injection Inject 1,000 mcg into the muscle every 30 (thirty) days.  Marland Kitchen docusate sodium (COLACE) 100 MG capsule Take 100 mg by mouth 2 (two) times daily.  Marland Kitchen  glucose blood test strip Use once daily to check blood sugars Dx 250.00  . lovastatin (MEVACOR) 20 MG tablet Take 1 tablet (20 mg total) by mouth at bedtime. (Patient taking differently: Take 10 mg by mouth at bedtime. )  . magnesium oxide (MAG-OX) 400 MG tablet Take 400 mg by mouth daily.  . valsartan-hydrochlorothiazide (DIOVAN-HCT) 320-12.5 MG tablet Take 1 tablet by mouth daily.  Marland Kitchen pyridostigmine (MESTINON) 60 MG tablet Take 1/2 tablet tid   No facility-administered encounter medications on file as of 09/19/2015.    Review of Systems  Constitutional: Negative for unexpected weight change.       Trouble eating as outlined.    HENT: Negative for congestion and sinus pressure.   Eyes: Negative for pain and discharge.  Respiratory: Negative for cough, chest tightness and shortness of breath.   Cardiovascular: Negative for chest pain,  palpitations and leg swelling.  Gastrointestinal: Negative for nausea, vomiting and abdominal pain.  Genitourinary: Negative for dysuria and difficulty urinating.  Musculoskeletal: Negative for back pain and joint swelling.  Skin: Negative for color change and rash.  Neurological: Negative for dizziness, light-headedness and headaches.       Feels his steps are not as solid.    Psychiatric/Behavioral: Negative for dysphoric mood and agitation.       Objective:    Physical Exam  Constitutional: He appears well-developed and well-nourished. No distress.  HENT:  Nose: Nose normal.  Mouth/Throat: Oropharynx is clear and moist.  Eyes: Conjunctivae are normal. Right eye exhibits no discharge. Left eye exhibits no discharge.  Neck: Neck supple.  Cardiovascular: Normal rate and regular rhythm.   Pulmonary/Chest: Effort normal and breath sounds normal. No respiratory distress.  Abdominal: Soft. Bowel sounds are normal. There is no tenderness.  Musculoskeletal: He exhibits no edema or tenderness.  Lymphadenopathy:    He has no cervical adenopathy.  Neurological:  Left facial droop.  Unable to fully extend his arms - left > right.  Upper extremity weakness - proximal muscle weakness.  Deliberate steps.  Wide based gait.    Skin: No rash noted. No erythema.  Psychiatric: He has a normal mood and affect. His behavior is normal.    BP 130/60 mmHg  Pulse 70  Temp(Src) 97.8 F (36.6 C) (Oral)  Resp 18  Ht 5\' 11"  (1.803 m)  Wt 227 lb (102.967 kg)  BMI 31.67 kg/m2  SpO2 95% Wt Readings from Last 3 Encounters:  09/19/15 227 lb (102.967 kg)  09/13/15 222 lb 3.2 oz (100.789 kg)  09/12/15 227 lb 6 oz (103.137 kg)     Lab Results  Component Value Date   WBC 8.0 09/12/2015   HGB 13.0 09/12/2015   HCT 38.5* 09/12/2015   PLT 240 09/12/2015   GLUCOSE 162* 09/12/2015   CHOL 137 07/07/2015   TRIG 107.0 07/07/2015   HDL 40.40 07/07/2015   LDLCALC 75 07/07/2015   ALT 36 09/12/2015   AST  44* 09/12/2015   NA 134* 09/12/2015   K 4.4 09/12/2015   CL 101 09/12/2015   CREATININE 0.84 09/12/2015   BUN 23* 09/12/2015   CO2 27 09/12/2015   TSH 2.693 09/12/2015   PSA 0.52 07/07/2015   HGBA1C 6.9* 09/12/2015   MICROALBUR 2.4* 01/19/2015    Mr Brain Osborn Contrast  09/13/2015  CLINICAL DATA:  81 year old hypertensive diabetic male complaining of left upper extremity weakness. Slurred speech. Subsequent encounter. EXAM: MRI HEAD WITHOUT CONTRAST MRA HEAD WITHOUT CONTRAST TECHNIQUE: Multiplanar, multiecho pulse sequences of the brain  and surrounding structures were obtained without intravenous contrast. Angiographic images of the head were obtained using MRA technique without contrast. COMPARISON:  09/12/2015 head CT.  No comparison brain MR. FINDINGS: MRI HEAD FINDINGS No acute infarct. No intracranial hemorrhage. Mild small vessel disease type changes. No intracranial mass lesion noted on this unenhanced exam. Global atrophy without hydrocephalus. Major intracranial vascular structures are patent. Opacification mastoid air cells and middle ear cavities greater on the right. Small cyst-like structure noted in the region of the fossa of Rosenmuller larger on the right which may contribute to eustachian tube dysfunction. Fluid level within the left maxillary sinus. Minimal mucosal thickening ethmoid sinus air cells. Small pituitary gland incidentally noted. Cervical medullary junction and pineal region unremarkable. Post lens replacement otherwise orbital structures unremarkable. MRA HEAD FINDINGS Narrowing versus artifact anterior aspect of the right internal carotid artery cavernous segment. Aplastic/ hypoplastic A1 segment right anterior cerebral artery. Middle cerebral artery mild moderate branch vessel irregularity narrowing bilaterally. Fetal type contribution to the right posterior cerebral artery. Artifact extends through the vertebral arteries and posterior inferior cerebellar artery. Limited  for evaluating for possible stenosis in this region. No high-grade stenosis of the basilar artery. Nonvisualized anterior inferior cerebellar artery. Narrowed irregular superior cerebellar arteries. Moderate to marked long segment narrowing right posterior cerebral artery mid aspect. Moderate to marked focal narrowing mid aspect left posterior cerebral artery with distal branch vessel narrowing. No aneurysm noted. IMPRESSION: MRI HEAD No acute infarct. Mild small vessel disease type changes. No intracranial mass lesion noted on this unenhanced exam. Global atrophy without hydrocephalus. Opacification mastoid air cells and middle ear cavities greater on the right. Small cyst-like structure noted in the region of the fossa of Rosenmuller larger on the right which may contribute to eustachian tube dysfunction. Fluid level within the left maxillary sinus. Minimal mucosal thickening ethmoid sinus air cells. MRA HEAD Narrowing versus artifact anterior aspect of the right internal carotid artery cavernous segment. Aplastic/ hypoplastic A1 segment right anterior cerebral artery. Middle cerebral artery mild to moderate branch vessel irregularity narrowing bilaterally. Artifact extends through the vertebral arteries and posterior inferior cerebellar artery. Limited for evaluating for possible stenosis in this region. No high-grade stenosis of the basilar artery. Nonvisualized anterior inferior cerebellar artery. Narrowed irregular superior cerebellar arteries. Moderate to marked long segment narrowing right posterior cerebral artery mid aspect. Moderate to marked focal narrowing mid aspect left posterior cerebral artery with distal branch vessel narrowing. Electronically Signed   By: Genia Del M.D.   On: 09/13/2015 12:10   Mr Jodene Nam Head/brain Osborn Cm  09/13/2015  CLINICAL DATA:  81 year old hypertensive diabetic male complaining of left upper extremity weakness. Slurred speech. Subsequent encounter. EXAM: MRI HEAD WITHOUT  CONTRAST MRA HEAD WITHOUT CONTRAST TECHNIQUE: Multiplanar, multiecho pulse sequences of the brain and surrounding structures were obtained without intravenous contrast. Angiographic images of the head were obtained using MRA technique without contrast. COMPARISON:  09/12/2015 head CT.  No comparison brain MR. FINDINGS: MRI HEAD FINDINGS No acute infarct. No intracranial hemorrhage. Mild small vessel disease type changes. No intracranial mass lesion noted on this unenhanced exam. Global atrophy without hydrocephalus. Major intracranial vascular structures are patent. Opacification mastoid air cells and middle ear cavities greater on the right. Small cyst-like structure noted in the region of the fossa of Rosenmuller larger on the right which may contribute to eustachian tube dysfunction. Fluid level within the left maxillary sinus. Minimal mucosal thickening ethmoid sinus air cells. Small pituitary gland incidentally noted. Cervical medullary junction and  pineal region unremarkable. Post lens replacement otherwise orbital structures unremarkable. MRA HEAD FINDINGS Narrowing versus artifact anterior aspect of the right internal carotid artery cavernous segment. Aplastic/ hypoplastic A1 segment right anterior cerebral artery. Middle cerebral artery mild moderate branch vessel irregularity narrowing bilaterally. Fetal type contribution to the right posterior cerebral artery. Artifact extends through the vertebral arteries and posterior inferior cerebellar artery. Limited for evaluating for possible stenosis in this region. No high-grade stenosis of the basilar artery. Nonvisualized anterior inferior cerebellar artery. Narrowed irregular superior cerebellar arteries. Moderate to marked long segment narrowing right posterior cerebral artery mid aspect. Moderate to marked focal narrowing mid aspect left posterior cerebral artery with distal branch vessel narrowing. No aneurysm noted. IMPRESSION: MRI HEAD No acute infarct.  Mild small vessel disease type changes. No intracranial mass lesion noted on this unenhanced exam. Global atrophy without hydrocephalus. Opacification mastoid air cells and middle ear cavities greater on the right. Small cyst-like structure noted in the region of the fossa of Rosenmuller larger on the right which may contribute to eustachian tube dysfunction. Fluid level within the left maxillary sinus. Minimal mucosal thickening ethmoid sinus air cells. MRA HEAD Narrowing versus artifact anterior aspect of the right internal carotid artery cavernous segment. Aplastic/ hypoplastic A1 segment right anterior cerebral artery. Middle cerebral artery mild to moderate branch vessel irregularity narrowing bilaterally. Artifact extends through the vertebral arteries and posterior inferior cerebellar artery. Limited for evaluating for possible stenosis in this region. No high-grade stenosis of the basilar artery. Nonvisualized anterior inferior cerebellar artery. Narrowed irregular superior cerebellar arteries. Moderate to marked long segment narrowing right posterior cerebral artery mid aspect. Moderate to marked focal narrowing mid aspect left posterior cerebral artery with distal branch vessel narrowing. Electronically Signed   By: Genia Del M.D.   On: 09/13/2015 12:10       Assessment & Plan:   Problem List Items Addressed This Visit    Diabetes mellitus ([place])    Sugars have been doing well.  No low blood sugars.  Follow.       Facial droop    Symptoms and exam as outlined.  Concern over myasthenia gravis.  Discussed with neurology.  Start mestinon.  Keep f/u appt with neurology.  This was explained in detail to the pat.        HYPERTENSION, BENIGN    Blood pressure doing well.  Follow.        Upper extremity weakness - Primary    Exam as outlined.  Bilateral - left > right and is proximal weakness.  Associated with chewing difficulty which worsens the longer he chews.  Left facial droop present.   Concern over the possibility of myastenia gravis. Recent issues with double vision.  Discussed with neurology.  Has appt with Dr Manuella Ghazi in one week.  Start mestinon 60mg  1/2 tablet tid.  Follow closely.            Einar Pheasant, MD

## 2015-09-19 NOTE — Progress Notes (Signed)
Pre-visit discussion using our clinic review tool. No additional management support is needed unless otherwise documented below in the visit note.  

## 2015-09-20 ENCOUNTER — Encounter: Payer: Self-pay | Admitting: Internal Medicine

## 2015-09-20 DIAGNOSIS — R29898 Other symptoms and signs involving the musculoskeletal system: Secondary | ICD-10-CM | POA: Insufficient documentation

## 2015-09-20 NOTE — Assessment & Plan Note (Signed)
Blood pressure doing well.  Follow.  

## 2015-09-20 NOTE — Assessment & Plan Note (Signed)
Exam as outlined.  Bilateral - left > right and is proximal weakness.  Associated with chewing difficulty which worsens the longer he chews.  Left facial droop present.  Concern over the possibility of myastenia gravis. Recent issues with double vision.  Discussed with neurology.  Has appt with Dr Manuella Ghazi in one week.  Start mestinon 60mg  1/2 tablet tid.  Follow closely.

## 2015-09-20 NOTE — Assessment & Plan Note (Signed)
Symptoms and exam as outlined.  Concern over myasthenia gravis.  Discussed with neurology.  Start mestinon.  Keep f/u appt with neurology.  This was explained in detail to the pat.

## 2015-09-20 NOTE — Assessment & Plan Note (Signed)
Sugars have been doing well.  No low blood sugars.  Follow.

## 2015-09-21 DIAGNOSIS — H532 Diplopia: Secondary | ICD-10-CM | POA: Diagnosis not present

## 2015-09-26 ENCOUNTER — Encounter: Payer: Self-pay | Admitting: Internal Medicine

## 2015-09-26 DIAGNOSIS — R5383 Other fatigue: Secondary | ICD-10-CM | POA: Insufficient documentation

## 2015-09-26 DIAGNOSIS — G7000 Myasthenia gravis without (acute) exacerbation: Secondary | ICD-10-CM | POA: Diagnosis not present

## 2015-09-26 DIAGNOSIS — H532 Diplopia: Secondary | ICD-10-CM | POA: Diagnosis not present

## 2015-09-26 DIAGNOSIS — M6281 Muscle weakness (generalized): Secondary | ICD-10-CM | POA: Diagnosis not present

## 2015-09-26 DIAGNOSIS — R29898 Other symptoms and signs involving the musculoskeletal system: Secondary | ICD-10-CM | POA: Insufficient documentation

## 2015-09-28 NOTE — Telephone Encounter (Signed)
This pt has already been seen and evaluated.  See chart notes.

## 2015-10-03 ENCOUNTER — Other Ambulatory Visit: Payer: Self-pay

## 2015-10-03 ENCOUNTER — Encounter: Payer: BLUE CROSS/BLUE SHIELD | Admitting: Internal Medicine

## 2015-10-03 MED ORDER — ACCU-CHEK SOFTCLIX LANCETS MISC: Status: DC

## 2015-10-05 DIAGNOSIS — L219 Seborrheic dermatitis, unspecified: Secondary | ICD-10-CM | POA: Diagnosis not present

## 2015-10-05 DIAGNOSIS — Z85828 Personal history of other malignant neoplasm of skin: Secondary | ICD-10-CM | POA: Diagnosis not present

## 2015-10-05 DIAGNOSIS — L821 Other seborrheic keratosis: Secondary | ICD-10-CM | POA: Diagnosis not present

## 2015-10-05 DIAGNOSIS — Z1283 Encounter for screening for malignant neoplasm of skin: Secondary | ICD-10-CM | POA: Diagnosis not present

## 2015-10-05 DIAGNOSIS — L820 Inflamed seborrheic keratosis: Secondary | ICD-10-CM | POA: Diagnosis not present

## 2015-10-05 DIAGNOSIS — L578 Other skin changes due to chronic exposure to nonionizing radiation: Secondary | ICD-10-CM | POA: Diagnosis not present

## 2015-10-06 ENCOUNTER — Encounter: Payer: Self-pay | Admitting: Internal Medicine

## 2015-10-06 ENCOUNTER — Ambulatory Visit (INDEPENDENT_AMBULATORY_CARE_PROVIDER_SITE_OTHER): Payer: Medicare Other | Admitting: Internal Medicine

## 2015-10-06 VITALS — BP 120/60 | HR 69 | Temp 97.6°F | Resp 18 | Ht 71.0 in | Wt 222.0 lb

## 2015-10-06 DIAGNOSIS — G7000 Myasthenia gravis without (acute) exacerbation: Secondary | ICD-10-CM

## 2015-10-06 DIAGNOSIS — I10 Essential (primary) hypertension: Secondary | ICD-10-CM

## 2015-10-06 DIAGNOSIS — R7989 Other specified abnormal findings of blood chemistry: Secondary | ICD-10-CM

## 2015-10-06 DIAGNOSIS — Z0000 Encounter for general adult medical examination without abnormal findings: Secondary | ICD-10-CM

## 2015-10-06 DIAGNOSIS — E871 Hypo-osmolality and hyponatremia: Secondary | ICD-10-CM

## 2015-10-06 DIAGNOSIS — I639 Cerebral infarction, unspecified: Secondary | ICD-10-CM | POA: Diagnosis not present

## 2015-10-06 DIAGNOSIS — E538 Deficiency of other specified B group vitamins: Secondary | ICD-10-CM

## 2015-10-06 DIAGNOSIS — E119 Type 2 diabetes mellitus without complications: Secondary | ICD-10-CM

## 2015-10-06 DIAGNOSIS — D649 Anemia, unspecified: Secondary | ICD-10-CM

## 2015-10-06 DIAGNOSIS — E7800 Pure hypercholesterolemia, unspecified: Secondary | ICD-10-CM

## 2015-10-06 DIAGNOSIS — R945 Abnormal results of liver function studies: Secondary | ICD-10-CM

## 2015-10-06 LAB — HEPATIC FUNCTION PANEL
ALBUMIN: 3.9 g/dL (ref 3.5–5.2)
ALK PHOS: 29 U/L — AB (ref 39–117)
ALT: 35 U/L (ref 0–53)
AST: 34 U/L (ref 0–37)
Bilirubin, Direct: 0.1 mg/dL (ref 0.0–0.3)
Total Bilirubin: 0.6 mg/dL (ref 0.2–1.2)
Total Protein: 8 g/dL (ref 6.0–8.3)

## 2015-10-06 LAB — SODIUM: SODIUM: 132 meq/L — AB (ref 135–145)

## 2015-10-06 MED ORDER — CYANOCOBALAMIN 1000 MCG/ML IJ SOLN
1000.0000 ug | Freq: Once | INTRAMUSCULAR | Status: AC
  Administered 2015-10-06: 1000 ug via INTRAMUSCULAR

## 2015-10-06 NOTE — Progress Notes (Signed)
Patient ID: [name], male   DOB: 03-21-1926, 81 y.o.   MRN: [id number]   Subjective:    Patient ID: [name], male    DOB: Feb 20, 1926, 81 y.o.   MRN: [id number]  HPI  Patient with past history of hypercholesterolemia, diabetes, hypertension and GERD.  Recently diagnosed with possible Myasthenia Gravis.  On mestinon now.  Seeing Dr Melrose Nakayama. Comes in today to follow up on these issues as well as for a complete physical exam.   Symptoms persist.  Still having issues with chewing.  Also difficulty raising his arms.  See last note for details.  Eating.  No chest pain or tightness.  No sob.  No acid reflux.  No abdominal pain or cramping.  Bowels stable.  States sugars are doing well.     Past Medical History  Diagnosis Date  . Diabetes mellitus ([place])   . HTN (hypertension)   . Hypercholesterolemia   . Urinary outflow obstruction     mild  . GERD (gastroesophageal reflux disease)   . BPH (benign prostatic hypertrophy)   . Skin cancer   . Degenerative arthritis   . Allergic rhinitis   . Iron deficiency anemia    Past Surgical History  Procedure Laterality Date  . Pilonidal cyst excision  1951    removal  . Rotator cuff repair  1995  . Tonsillectomy and adenoidectomy  1938  . Skin cancer excision      multiple   Family History  Problem Relation Age of Onset  . Heart disease Father     heart atack  . Alzheimer's disease Mother   . CVA Brother    Social History   Social History  . Marital Status: Married    Spouse Name: N/A  . Number of Children: N/A  . Years of Education: N/A   Social History Main Topics  . Smoking status: Never Smoker   . Smokeless tobacco: Never Used  . Alcohol Use: No  . Drug Use: No  . Sexual Activity: Not Asked   Other Topics Concern  . None   Social History Narrative   Retired, married. Walks everyday          Outpatient Encounter Prescriptions as of 10/06/2015  Medication Sig  . ACCU-CHEK SOFTCLIX LANCETS lancets  Use as instructed  . aspirin (ASPIR-LOW) 81 MG EC tablet Take 81 mg by mouth daily.    . calcium-vitamin D (OSCAL 500/200 D-3) 500-200 MG-UNIT per tablet Take 1 tablet by mouth 2 (two) times daily.   . cyanocobalamin (,VITAMIN B-12,) 1000 MCG/ML injection Inject 1,000 mcg into the muscle every 30 (thirty) days.  Marland Kitchen docusate sodium (COLACE) 100 MG capsule Take 100 mg by mouth 2 (two) times daily.  Marland Kitchen glucose blood test strip Use once daily to check blood sugars Dx 250.00  . lovastatin (MEVACOR) 20 MG tablet Take 1 tablet (20 mg total) by mouth at bedtime. (Patient taking differently: Take 10 mg by mouth at bedtime. )  . magnesium oxide (MAG-OX) 400 MG tablet Take 400 mg by mouth daily.  Marland Kitchen pyridostigmine (MESTINON) 60 MG tablet Take 1/2 tablet tid (Patient taking differently: Take 60 mg by mouth 3 (three) times daily. Take 1/2 tablet tid)  . valsartan-hydrochlorothiazide (DIOVAN-HCT) 320-12.5 MG tablet Take 1 tablet by mouth daily.  . [EXPIRED] cyanocobalamin ((VITAMIN B-12)) injection 1,000 mcg    No facility-administered encounter medications on file as of 10/06/2015.    Review of Systems  Constitutional: Positive for fatigue. Negative for unexpected  weight change.  HENT: Negative for congestion and sinus pressure.   Eyes: Negative for pain and visual disturbance.  Respiratory: Negative for cough, chest tightness and shortness of breath.   Cardiovascular: Negative for chest pain, palpitations and leg swelling.  Gastrointestinal: Negative for nausea, vomiting, abdominal pain and diarrhea.  Genitourinary: Negative for dysuria and difficulty urinating.  Musculoskeletal: Negative for back pain and joint swelling.  Skin: Negative for color change and rash.  Neurological: Negative for dizziness, light-headedness and headaches.       Difficulty chewing.  Fatigues with chewing.  Weakness in upper extremities.    Hematological: Negative for adenopathy. Does not bruise/bleed easily.    Psychiatric/Behavioral: Negative for dysphoric mood and agitation.       Objective:    Physical Exam  Constitutional: He is oriented to person, place, and time. He appears well-developed and well-nourished. No distress.  HENT:  Head: Normocephalic and atraumatic.  Nose: Nose normal.  Mouth/Throat: Oropharynx is clear and moist. No oropharyngeal exudate.  Eyes: Conjunctivae are normal. Right eye exhibits no discharge. Left eye exhibits no discharge.  Neck: Neck supple. No thyromegaly present.  Cardiovascular: Normal rate and regular rhythm.   Pulmonary/Chest: Breath sounds normal. No respiratory distress. He has no wheezes.  Abdominal: Soft. Bowel sounds are normal. There is no tenderness.  Genitourinary:  Rectal exam performed.  Heme negative.   Musculoskeletal: He exhibits no edema or tenderness.  Lymphadenopathy:    He has no cervical adenopathy.  Neurological: He is alert and oriented to person, place, and time.  Skin: Skin is warm and dry. No rash noted. No erythema.  Psychiatric: He has a normal mood and affect. His behavior is normal.    BP 120/60 mmHg  Pulse 69  Temp(Src) 97.6 F (36.4 C) (Oral)  Resp 18  Ht '5\' 11"'  (1.803 m)  Wt 222 lb (100.699 kg)  BMI 30.98 kg/m2  SpO2 95% Wt Readings from Last 3 Encounters:  10/06/15 222 lb (100.699 kg)  09/19/15 227 lb (102.967 kg)  09/13/15 222 lb 3.2 oz (100.789 kg)     Lab Results  Component Value Date   WBC 8.0 09/12/2015   HGB 13.0 09/12/2015   HCT 38.5* 09/12/2015   PLT 240 09/12/2015   GLUCOSE 162* 09/12/2015   CHOL 137 07/07/2015   TRIG 107.0 07/07/2015   HDL 40.40 07/07/2015   LDLCALC 75 07/07/2015   ALT 35 10/06/2015   AST 34 10/06/2015   NA 132* 10/06/2015   K 4.4 09/12/2015   CL 101 09/12/2015   CREATININE 0.84 09/12/2015   BUN 23* 09/12/2015   CO2 27 09/12/2015   TSH 2.693 09/12/2015   PSA 0.52 07/07/2015   HGBA1C 6.9* 09/12/2015   MICROALBUR 2.4* 01/19/2015    Mr Brain Wo  Contrast  09/13/2015  CLINICAL DATA:  81 year old hypertensive diabetic male complaining of left upper extremity weakness. Slurred speech. Subsequent encounter. EXAM: MRI HEAD WITHOUT CONTRAST MRA HEAD WITHOUT CONTRAST TECHNIQUE: Multiplanar, multiecho pulse sequences of the brain and surrounding structures were obtained without intravenous contrast. Angiographic images of the head were obtained using MRA technique without contrast. COMPARISON:  09/12/2015 head CT.  No comparison brain MR. FINDINGS: MRI HEAD FINDINGS No acute infarct. No intracranial hemorrhage. Mild small vessel disease type changes. No intracranial mass lesion noted on this unenhanced exam. Global atrophy without hydrocephalus. Major intracranial vascular structures are patent. Opacification mastoid air cells and middle ear cavities greater on the right. Small cyst-like structure noted in the region of the  fossa of Rosenmuller larger on the right which may contribute to eustachian tube dysfunction. Fluid level within the left maxillary sinus. Minimal mucosal thickening ethmoid sinus air cells. Small pituitary gland incidentally noted. Cervical medullary junction and pineal region unremarkable. Post lens replacement otherwise orbital structures unremarkable. MRA HEAD FINDINGS Narrowing versus artifact anterior aspect of the right internal carotid artery cavernous segment. Aplastic/ hypoplastic A1 segment right anterior cerebral artery. Middle cerebral artery mild moderate branch vessel irregularity narrowing bilaterally. Fetal type contribution to the right posterior cerebral artery. Artifact extends through the vertebral arteries and posterior inferior cerebellar artery. Limited for evaluating for possible stenosis in this region. No high-grade stenosis of the basilar artery. Nonvisualized anterior inferior cerebellar artery. Narrowed irregular superior cerebellar arteries. Moderate to marked long segment narrowing right posterior cerebral artery  mid aspect. Moderate to marked focal narrowing mid aspect left posterior cerebral artery with distal branch vessel narrowing. No aneurysm noted. IMPRESSION: MRI HEAD No acute infarct. Mild small vessel disease type changes. No intracranial mass lesion noted on this unenhanced exam. Global atrophy without hydrocephalus. Opacification mastoid air cells and middle ear cavities greater on the right. Small cyst-like structure noted in the region of the fossa of Rosenmuller larger on the right which may contribute to eustachian tube dysfunction. Fluid level within the left maxillary sinus. Minimal mucosal thickening ethmoid sinus air cells. MRA HEAD Narrowing versus artifact anterior aspect of the right internal carotid artery cavernous segment. Aplastic/ hypoplastic A1 segment right anterior cerebral artery. Middle cerebral artery mild to moderate branch vessel irregularity narrowing bilaterally. Artifact extends through the vertebral arteries and posterior inferior cerebellar artery. Limited for evaluating for possible stenosis in this region. No high-grade stenosis of the basilar artery. Nonvisualized anterior inferior cerebellar artery. Narrowed irregular superior cerebellar arteries. Moderate to marked long segment narrowing right posterior cerebral artery mid aspect. Moderate to marked focal narrowing mid aspect left posterior cerebral artery with distal branch vessel narrowing. Electronically Signed   By: Genia Del M.D.   On: 09/13/2015 12:10   Mr Jodene Nam Head/brain Wo Cm  09/13/2015  CLINICAL DATA:  81 year old hypertensive diabetic male complaining of left upper extremity weakness. Slurred speech. Subsequent encounter. EXAM: MRI HEAD WITHOUT CONTRAST MRA HEAD WITHOUT CONTRAST TECHNIQUE: Multiplanar, multiecho pulse sequences of the brain and surrounding structures were obtained without intravenous contrast. Angiographic images of the head were obtained using MRA technique without contrast. COMPARISON:   09/12/2015 head CT.  No comparison brain MR. FINDINGS: MRI HEAD FINDINGS No acute infarct. No intracranial hemorrhage. Mild small vessel disease type changes. No intracranial mass lesion noted on this unenhanced exam. Global atrophy without hydrocephalus. Major intracranial vascular structures are patent. Opacification mastoid air cells and middle ear cavities greater on the right. Small cyst-like structure noted in the region of the fossa of Rosenmuller larger on the right which may contribute to eustachian tube dysfunction. Fluid level within the left maxillary sinus. Minimal mucosal thickening ethmoid sinus air cells. Small pituitary gland incidentally noted. Cervical medullary junction and pineal region unremarkable. Post lens replacement otherwise orbital structures unremarkable. MRA HEAD FINDINGS Narrowing versus artifact anterior aspect of the right internal carotid artery cavernous segment. Aplastic/ hypoplastic A1 segment right anterior cerebral artery. Middle cerebral artery mild moderate branch vessel irregularity narrowing bilaterally. Fetal type contribution to the right posterior cerebral artery. Artifact extends through the vertebral arteries and posterior inferior cerebellar artery. Limited for evaluating for possible stenosis in this region. No high-grade stenosis of the basilar artery. Nonvisualized anterior inferior cerebellar artery. Narrowed  irregular superior cerebellar arteries. Moderate to marked long segment narrowing right posterior cerebral artery mid aspect. Moderate to marked focal narrowing mid aspect left posterior cerebral artery with distal branch vessel narrowing. No aneurysm noted. IMPRESSION: MRI HEAD No acute infarct. Mild small vessel disease type changes. No intracranial mass lesion noted on this unenhanced exam. Global atrophy without hydrocephalus. Opacification mastoid air cells and middle ear cavities greater on the right. Small cyst-like structure noted in the region of  the fossa of Rosenmuller larger on the right which may contribute to eustachian tube dysfunction. Fluid level within the left maxillary sinus. Minimal mucosal thickening ethmoid sinus air cells. MRA HEAD Narrowing versus artifact anterior aspect of the right internal carotid artery cavernous segment. Aplastic/ hypoplastic A1 segment right anterior cerebral artery. Middle cerebral artery mild to moderate branch vessel irregularity narrowing bilaterally. Artifact extends through the vertebral arteries and posterior inferior cerebellar artery. Limited for evaluating for possible stenosis in this region. No high-grade stenosis of the basilar artery. Nonvisualized anterior inferior cerebellar artery. Narrowed irregular superior cerebellar arteries. Moderate to marked long segment narrowing right posterior cerebral artery mid aspect. Moderate to marked focal narrowing mid aspect left posterior cerebral artery with distal branch vessel narrowing. Electronically Signed   By: Genia Del M.D.   On: 09/13/2015 12:10       Assessment & Plan:   Problem List Items Addressed This Visit    Anemia    Follow cbc.       Relevant Medications   cyanocobalamin ((VITAMIN B-12)) injection 1,000 mcg (Completed)   B12 deficiency   Relevant Medications   cyanocobalamin ((VITAMIN B-12)) injection 1,000 mcg (Completed)   Diabetes mellitus ([place])    Sugars have been doing well.  No low blood sugars.  Follow met b and a1c.  a1c just checked - 6.9.        Health care maintenance    Physical today 10/06/15.  PSA .52 07/07/15.        Hypercholesterolemia    Low cholesterol diet and exercise.  On lovastatin.  Follow lipid panel and liver function tests.       HYPERTENSION, BENIGN    Blood pressure under good control.  Continue same medication regimen.  Follow pressures.  Follow metabolic panel.        Hyponatremia    Sodium slightly decreased last check.  Recheck sodium today.       Relevant Orders   Sodium  (Completed)   Myasthenia gravis ([place])    Presumed myasthenia gravis.  On mestinon.  Seeing neurology.         Other Visit Diagnoses    Abnormal liver function test    -  Primary    Relevant Orders    Hepatic function panel (Completed)        Einar Pheasant, MD

## 2015-10-06 NOTE — Progress Notes (Signed)
Pre-visit discussion using our clinic review tool. No additional management support is needed unless otherwise documented below in the visit note.  

## 2015-10-07 ENCOUNTER — Telehealth: Payer: Self-pay | Admitting: Internal Medicine

## 2015-10-07 ENCOUNTER — Encounter: Payer: Self-pay | Admitting: *Deleted

## 2015-10-07 ENCOUNTER — Encounter: Payer: Self-pay | Admitting: Internal Medicine

## 2015-10-07 ENCOUNTER — Other Ambulatory Visit: Payer: Self-pay | Admitting: Internal Medicine

## 2015-10-07 DIAGNOSIS — E871 Hypo-osmolality and hyponatremia: Secondary | ICD-10-CM

## 2015-10-07 NOTE — Telephone Encounter (Signed)
Pt called stating the pharmacy needs a DWO form for ACCU-CHEK SOFTCLIX LANCETS lancets that needs to be filled out and the pharmacy needs the form. Pharmacy is [facility] [address] - [place], [place]. Thank You!

## 2015-10-07 NOTE — Progress Notes (Signed)
Order placed for f/u sodium.  ?

## 2015-10-07 NOTE — Telephone Encounter (Signed)
[facility] sent [phone] in on 10/03/15

## 2015-10-08 ENCOUNTER — Encounter: Payer: Self-pay | Admitting: Internal Medicine

## 2015-10-09 ENCOUNTER — Encounter: Payer: Self-pay | Admitting: Internal Medicine

## 2015-10-09 DIAGNOSIS — G7000 Myasthenia gravis without (acute) exacerbation: Secondary | ICD-10-CM | POA: Insufficient documentation

## 2015-10-09 NOTE — Assessment & Plan Note (Signed)
Physical today 10/06/15.  PSA .52 07/07/15.

## 2015-10-09 NOTE — Assessment & Plan Note (Signed)
Presumed myasthenia gravis.  On mestinon.  Seeing neurology.

## 2015-10-09 NOTE — Assessment & Plan Note (Signed)
Sodium slightly decreased last check.  Recheck sodium today.

## 2015-10-09 NOTE — Assessment & Plan Note (Signed)
Sugars have been doing well.  No low blood sugars.  Follow met b and a1c.  a1c just checked - 6.9.

## 2015-10-09 NOTE — Assessment & Plan Note (Signed)
Blood pressure under good control.  Continue same medication regimen.  Follow pressures.  Follow metabolic panel.   

## 2015-10-09 NOTE — Assessment & Plan Note (Signed)
Low cholesterol diet and exercise.  On lovastatin.  Follow lipid panel and liver function tests.   

## 2015-10-09 NOTE — Assessment & Plan Note (Signed)
Follow cbc.  

## 2015-10-10 ENCOUNTER — Other Ambulatory Visit: Payer: Self-pay

## 2015-10-10 DIAGNOSIS — M9905 Segmental and somatic dysfunction of pelvic region: Secondary | ICD-10-CM | POA: Diagnosis not present

## 2015-10-10 DIAGNOSIS — M9903 Segmental and somatic dysfunction of lumbar region: Secondary | ICD-10-CM | POA: Diagnosis not present

## 2015-10-10 DIAGNOSIS — M5136 Other intervertebral disc degeneration, lumbar region: Secondary | ICD-10-CM | POA: Diagnosis not present

## 2015-10-10 DIAGNOSIS — M955 Acquired deformity of pelvis: Secondary | ICD-10-CM | POA: Diagnosis not present

## 2015-10-10 MED ORDER — ACCU-CHEK SOFTCLIX LANCETS MISC: Status: DC

## 2015-10-11 ENCOUNTER — Telehealth: Payer: Self-pay | Admitting: *Deleted

## 2015-10-11 NOTE — Telephone Encounter (Signed)
Did we find this form?

## 2015-10-11 NOTE — Telephone Encounter (Signed)
Form received & will be completed & faxed back tomorrow

## 2015-10-11 NOTE — Telephone Encounter (Signed)
Correction, form faxed today & pt notified

## 2015-10-11 NOTE — Telephone Encounter (Signed)
Patient requested a DWO form be sent over to [facility] for his diabetic supplies. Please advise

## 2015-10-17 ENCOUNTER — Telehealth: Payer: Self-pay | Admitting: *Deleted

## 2015-10-17 ENCOUNTER — Other Ambulatory Visit: Payer: Medicare Other

## 2015-10-17 ENCOUNTER — Other Ambulatory Visit (INDEPENDENT_AMBULATORY_CARE_PROVIDER_SITE_OTHER): Payer: Medicare Other

## 2015-10-17 DIAGNOSIS — E871 Hypo-osmolality and hyponatremia: Secondary | ICD-10-CM

## 2015-10-17 DIAGNOSIS — R29810 Facial weakness: Secondary | ICD-10-CM

## 2015-10-17 DIAGNOSIS — H532 Diplopia: Secondary | ICD-10-CM | POA: Diagnosis not present

## 2015-10-17 DIAGNOSIS — R5383 Other fatigue: Secondary | ICD-10-CM | POA: Diagnosis not present

## 2015-10-17 DIAGNOSIS — K117 Disturbances of salivary secretion: Secondary | ICD-10-CM

## 2015-10-17 DIAGNOSIS — G7000 Myasthenia gravis without (acute) exacerbation: Secondary | ICD-10-CM | POA: Diagnosis not present

## 2015-10-17 DIAGNOSIS — M6281 Muscle weakness (generalized): Secondary | ICD-10-CM | POA: Diagnosis not present

## 2015-10-17 DIAGNOSIS — R29898 Other symptoms and signs involving the musculoskeletal system: Secondary | ICD-10-CM

## 2015-10-17 NOTE — Telephone Encounter (Signed)
It was a sodium level.  It looks like you have drawn the lab.  Let me know if you need anything more.

## 2015-10-17 NOTE — Telephone Encounter (Signed)
Labs and dx?  

## 2015-10-18 ENCOUNTER — Other Ambulatory Visit: Payer: Self-pay | Admitting: Neurology

## 2015-10-18 DIAGNOSIS — G7000 Myasthenia gravis without (acute) exacerbation: Secondary | ICD-10-CM

## 2015-10-18 LAB — SODIUM: Sodium: 136 mEq/L (ref 135–145)

## 2015-10-19 ENCOUNTER — Other Ambulatory Visit: Payer: Self-pay | Admitting: Internal Medicine

## 2015-10-19 ENCOUNTER — Encounter: Payer: Self-pay | Admitting: Internal Medicine

## 2015-10-19 DIAGNOSIS — D649 Anemia, unspecified: Secondary | ICD-10-CM

## 2015-10-19 DIAGNOSIS — R7989 Other specified abnormal findings of blood chemistry: Secondary | ICD-10-CM

## 2015-10-19 NOTE — Progress Notes (Signed)
Order placed for f/u labs.  

## 2015-10-27 DIAGNOSIS — G7001 Myasthenia gravis with (acute) exacerbation: Secondary | ICD-10-CM | POA: Diagnosis not present

## 2015-10-28 ENCOUNTER — Ambulatory Visit
Admission: RE | Admit: 2015-10-28 | Discharge: 2015-10-28 | Disposition: A | Discharge status: Home / Self Care | Payer: Medicare Other | Source: Ambulatory Visit | Attending: Neurology | Admitting: Neurology

## 2015-10-28 DIAGNOSIS — M5124 Other intervertebral disc displacement, thoracic region: Secondary | ICD-10-CM | POA: Diagnosis not present

## 2015-10-28 DIAGNOSIS — G7000 Myasthenia gravis without (acute) exacerbation: Secondary | ICD-10-CM | POA: Insufficient documentation

## 2015-10-28 DIAGNOSIS — I2510 Atherosclerotic heart disease of native coronary artery without angina pectoris: Secondary | ICD-10-CM | POA: Diagnosis not present

## 2015-10-28 LAB — POCT I-STAT CREATININE: Creatinine, Ser: 0.8 mg/dL (ref 0.61–1.24)

## 2015-10-28 MED ORDER — IOHEXOL 300 MG/ML  SOLN
75.0000 mL | Freq: Once | INTRAMUSCULAR | Status: AC | PRN
  Administered 2015-10-28: 75 mL via INTRAVENOUS

## 2015-11-02 DIAGNOSIS — H532 Diplopia: Secondary | ICD-10-CM | POA: Diagnosis not present

## 2015-11-08 ENCOUNTER — Other Ambulatory Visit: Payer: Self-pay

## 2015-11-08 MED ORDER — GLUCOSE BLOOD VI STRP: ORAL_STRIP | Status: DC

## 2015-11-09 ENCOUNTER — Encounter: Payer: Self-pay | Admitting: Internal Medicine

## 2015-11-16 DIAGNOSIS — Z5181 Encounter for therapeutic drug level monitoring: Secondary | ICD-10-CM | POA: Diagnosis not present

## 2015-11-16 DIAGNOSIS — G7000 Myasthenia gravis without (acute) exacerbation: Secondary | ICD-10-CM | POA: Diagnosis not present

## 2015-11-17 DIAGNOSIS — L821 Other seborrheic keratosis: Secondary | ICD-10-CM | POA: Diagnosis not present

## 2015-11-17 DIAGNOSIS — L219 Seborrheic dermatitis, unspecified: Secondary | ICD-10-CM | POA: Diagnosis not present

## 2015-11-17 DIAGNOSIS — L820 Inflamed seborrheic keratosis: Secondary | ICD-10-CM | POA: Diagnosis not present

## 2015-11-18 DIAGNOSIS — G7001 Myasthenia gravis with (acute) exacerbation: Secondary | ICD-10-CM | POA: Diagnosis not present

## 2015-11-18 DIAGNOSIS — G7000 Myasthenia gravis without (acute) exacerbation: Secondary | ICD-10-CM | POA: Diagnosis not present

## 2015-11-18 DIAGNOSIS — Z79899 Other long term (current) drug therapy: Secondary | ICD-10-CM | POA: Diagnosis not present

## 2015-11-22 DIAGNOSIS — G7000 Myasthenia gravis without (acute) exacerbation: Secondary | ICD-10-CM | POA: Diagnosis not present

## 2015-11-22 DIAGNOSIS — G7001 Myasthenia gravis with (acute) exacerbation: Secondary | ICD-10-CM | POA: Diagnosis not present

## 2015-11-23 DIAGNOSIS — G7000 Myasthenia gravis without (acute) exacerbation: Secondary | ICD-10-CM | POA: Diagnosis not present

## 2015-11-25 DIAGNOSIS — G7000 Myasthenia gravis without (acute) exacerbation: Secondary | ICD-10-CM | POA: Diagnosis not present

## 2015-11-25 DIAGNOSIS — G7001 Myasthenia gravis with (acute) exacerbation: Secondary | ICD-10-CM | POA: Diagnosis not present

## 2015-11-27 ENCOUNTER — Encounter: Payer: Self-pay | Admitting: Internal Medicine

## 2015-11-28 DIAGNOSIS — M955 Acquired deformity of pelvis: Secondary | ICD-10-CM | POA: Diagnosis not present

## 2015-11-28 DIAGNOSIS — M9903 Segmental and somatic dysfunction of lumbar region: Secondary | ICD-10-CM | POA: Diagnosis not present

## 2015-11-28 DIAGNOSIS — M5136 Other intervertebral disc degeneration, lumbar region: Secondary | ICD-10-CM | POA: Diagnosis not present

## 2015-11-28 DIAGNOSIS — M9905 Segmental and somatic dysfunction of pelvic region: Secondary | ICD-10-CM | POA: Diagnosis not present

## 2015-11-30 DIAGNOSIS — H532 Diplopia: Secondary | ICD-10-CM | POA: Diagnosis not present

## 2015-11-30 DIAGNOSIS — G7000 Myasthenia gravis without (acute) exacerbation: Secondary | ICD-10-CM | POA: Diagnosis not present

## 2015-11-30 DIAGNOSIS — E1139 Type 2 diabetes mellitus with other diabetic ophthalmic complication: Secondary | ICD-10-CM | POA: Diagnosis not present

## 2015-11-30 DIAGNOSIS — R5383 Other fatigue: Secondary | ICD-10-CM | POA: Diagnosis not present

## 2015-11-30 DIAGNOSIS — M6281 Muscle weakness (generalized): Secondary | ICD-10-CM | POA: Diagnosis not present

## 2015-12-01 NOTE — Telephone Encounter (Signed)
Unread mychart message mailed to patient 

## 2015-12-05 ENCOUNTER — Encounter: Payer: Self-pay | Admitting: Internal Medicine

## 2015-12-06 NOTE — Telephone Encounter (Signed)
Please advise 

## 2015-12-26 DIAGNOSIS — M9905 Segmental and somatic dysfunction of pelvic region: Secondary | ICD-10-CM | POA: Diagnosis not present

## 2015-12-26 DIAGNOSIS — M9903 Segmental and somatic dysfunction of lumbar region: Secondary | ICD-10-CM | POA: Diagnosis not present

## 2015-12-26 DIAGNOSIS — M955 Acquired deformity of pelvis: Secondary | ICD-10-CM | POA: Diagnosis not present

## 2015-12-26 DIAGNOSIS — M5136 Other intervertebral disc degeneration, lumbar region: Secondary | ICD-10-CM | POA: Diagnosis not present

## 2016-01-05 ENCOUNTER — Ambulatory Visit (INDEPENDENT_AMBULATORY_CARE_PROVIDER_SITE_OTHER): Payer: Medicare Other | Admitting: Internal Medicine

## 2016-01-05 ENCOUNTER — Encounter: Payer: Self-pay | Admitting: Internal Medicine

## 2016-01-05 VITALS — BP 120/50 | HR 70 | Temp 97.7°F | Resp 18 | Ht 71.0 in | Wt 211.0 lb

## 2016-01-05 DIAGNOSIS — G7000 Myasthenia gravis without (acute) exacerbation: Secondary | ICD-10-CM | POA: Diagnosis not present

## 2016-01-05 DIAGNOSIS — E119 Type 2 diabetes mellitus without complications: Secondary | ICD-10-CM | POA: Diagnosis not present

## 2016-01-05 DIAGNOSIS — E871 Hypo-osmolality and hyponatremia: Secondary | ICD-10-CM

## 2016-01-05 DIAGNOSIS — E7800 Pure hypercholesterolemia, unspecified: Secondary | ICD-10-CM

## 2016-01-05 DIAGNOSIS — R3911 Hesitancy of micturition: Secondary | ICD-10-CM

## 2016-01-05 DIAGNOSIS — R350 Frequency of micturition: Secondary | ICD-10-CM

## 2016-01-05 DIAGNOSIS — I10 Essential (primary) hypertension: Secondary | ICD-10-CM

## 2016-01-05 LAB — URINALYSIS, ROUTINE W REFLEX MICROSCOPIC
BILIRUBIN URINE: NEGATIVE
Hgb urine dipstick: NEGATIVE
LEUKOCYTES UA: NEGATIVE
Nitrite: NEGATIVE
PH: 6 (ref 5.0–8.0)
SPECIFIC GRAVITY, URINE: 1.02 (ref 1.000–1.030)
TOTAL PROTEIN, URINE-UPE24: NEGATIVE
URINE GLUCOSE: 250 — AB
UROBILINOGEN UA: 0.2 (ref 0.0–1.0)

## 2016-01-05 NOTE — Progress Notes (Signed)
Pre-visit discussion using our clinic review tool. No additional management support is needed unless otherwise documented below in the visit note.  

## 2016-01-05 NOTE — Patient Instructions (Signed)
Zantac (ranitidine) 150mg - take one tablet 30 minutes before breakfast.   

## 2016-01-05 NOTE — Progress Notes (Signed)
Patient ID: [name], male   DOB: 1926/01/17, 82 y.o.   MRN: [id number]   Subjective:    Patient ID: [name], male    DOB: 05/13/1926, 82 y.o.   MRN: [id number]  HPI  Patient with past history of hypercholesterolemia, diabetes, GERD and hypertension.  Recently diagnosed with myasthenia gravis.  Being treated.  Comes in today for a scheduled follow up.  Tolerating treatments for his myasthenia.  Doing better.  On prednisone.  Sugars have increased.  States am sugars averaging 125.  Pm sugars averaging 220.  He has lost weight.  States this occurred when he could not eat as well.  Eating better now.  Able to chew now.  Weight is coming up.  No chest pain or tightness.  No sob.  Does report some increased urinary frequency and feels he is not emptying his bladder.  No abdominal pain or cramping.  Bowels stable.     Past Medical History  Diagnosis Date  . Diabetes mellitus ([place])   . HTN (hypertension)   . Hypercholesterolemia   . Urinary outflow obstruction     mild  . GERD (gastroesophageal reflux disease)   . BPH (benign prostatic hypertrophy)   . Skin cancer   . Degenerative arthritis   . Allergic rhinitis   . Iron deficiency anemia    Past Surgical History  Procedure Laterality Date  . Pilonidal cyst excision  1951    removal  . Rotator cuff repair  1995  . Tonsillectomy and adenoidectomy  1938  . Skin cancer excision      multiple   Family History  Problem Relation Age of Onset  . Heart disease Father     heart atack  . Alzheimer's disease Mother   . CVA Brother    Social History   Social History  . Marital Status: Married    Spouse Name: N/A  . Number of Children: N/A  . Years of Education: N/A   Social History Main Topics  . Smoking status: Never Smoker   . Smokeless tobacco: Never Used  . Alcohol Use: No  . Drug Use: No  . Sexual Activity: Not Asked   Other Topics Concern  . None   Social History Narrative   Retired, married. Walks  everyday          Outpatient Encounter Prescriptions as of 01/05/2016  Medication Sig  . ACCU-CHEK SOFTCLIX LANCETS lancets Use as instructed  . aspirin (ASPIR-LOW) 81 MG EC tablet Take 81 mg by mouth daily.    . calcium-vitamin D (OSCAL 500/200 D-3) 500-200 MG-UNIT per tablet Take 1 tablet by mouth 2 (two) times daily.   . cyanocobalamin (,VITAMIN B-12,) 1000 MCG/ML injection Inject 1,000 mcg into the muscle every 30 (thirty) days.  Marland Kitchen docusate sodium (COLACE) 100 MG capsule Take 100 mg by mouth 2 (two) times daily.  Marland Kitchen glucose blood test strip Use once daily to check blood sugars Dx 250.00 Accu-check Compact plus strips  . lovastatin (MEVACOR) 20 MG tablet Take 1 tablet (20 mg total) by mouth at bedtime. (Patient taking differently: Take 10 mg by mouth at bedtime. )  . magnesium oxide (MAG-OX) 400 MG tablet Take 400 mg by mouth daily.  . mycophenolate (CELLCEPT) 500 MG tablet Take 1,000 mg by mouth 2 (two) times daily.  . predniSONE (DELTASONE) 10 MG tablet Take 30 mg by mouth daily with breakfast.  . pyridostigmine (MESTINON) 60 MG tablet Take 1/2 tablet tid (Patient taking differently: Take 60  mg by mouth 3 (three) times daily. Take 1/2 tablet tid)  . valsartan-hydrochlorothiazide (DIOVAN-HCT) 320-12.5 MG tablet Take 1 tablet by mouth daily.   No facility-administered encounter medications on file as of 01/05/2016.    Review of Systems  Constitutional: Negative for appetite change (back to normal now. ).       Some weight loss as outlined.   HENT: Negative for congestion and sinus pressure.   Respiratory: Negative for cough, chest tightness and shortness of breath.   Cardiovascular: Negative for chest pain, palpitations and leg swelling.  Gastrointestinal: Negative for nausea, vomiting, abdominal pain and diarrhea.  Genitourinary: Positive for frequency.       Feels like not emptying bladder fully.   Musculoskeletal: Negative for back pain and joint swelling.  Skin: Negative for color  change and rash.  Neurological: Negative for dizziness, light-headedness and headaches.  Psychiatric/Behavioral: Negative for dysphoric mood and agitation.       Objective:    Physical Exam  Constitutional: He appears well-developed and well-nourished. No distress.  HENT:  Nose: Nose normal.  Mouth/Throat: Oropharynx is clear and moist.  Eyes: Conjunctivae are normal. Right eye exhibits no discharge. Left eye exhibits no discharge.  Neck: Neck supple. No thyromegaly present.  Cardiovascular: Normal rate and regular rhythm.   Pulmonary/Chest: Effort normal and breath sounds normal. No respiratory distress.  Abdominal: Soft. Bowel sounds are normal. There is no tenderness.  Genitourinary:  Rectal exam - right prostate fullness.   Musculoskeletal: He exhibits no edema or tenderness.  Lymphadenopathy:    He has no cervical adenopathy.  Skin: No rash noted. No erythema.  Psychiatric: He has a normal mood and affect. His behavior is normal.    BP 120/50 mmHg  Pulse 70  Temp(Src) 97.7 F (36.5 C) (Oral)  Resp 18  Ht 5' 11" (1.803 m)  Wt 211 lb (95.709 kg)  BMI 29.44 kg/m2  SpO2 96% Wt Readings from Last 3 Encounters:  01/05/16 211 lb (95.709 kg)  10/06/15 222 lb (100.699 kg)  09/19/15 227 lb (102.967 kg)     Lab Results  Component Value Date   WBC 8.0 09/12/2015   HGB 13.0 09/12/2015   HCT 38.5* 09/12/2015   PLT 240 09/12/2015   GLUCOSE 162* 09/12/2015   CHOL 137 07/07/2015   TRIG 107.0 07/07/2015   HDL 40.40 07/07/2015   LDLCALC 75 07/07/2015   ALT 35 10/06/2015   AST 34 10/06/2015   NA 136 10/17/2015   K 4.4 09/12/2015   CL 101 09/12/2015   CREATININE 0.80 10/28/2015   BUN 23* 09/12/2015   CO2 27 09/12/2015   TSH 2.693 09/12/2015   PSA 0.52 07/07/2015   HGBA1C 6.9* 09/12/2015   MICROALBUR 2.4* 01/19/2015    Ct Chest W Contrast  10/28/2015  CLINICAL DATA:  New onset myasthenia gravis EXAM: CT CHEST WITH CONTRAST TECHNIQUE: Multidetector CT imaging of  the chest was performed during intravenous contrast administration. CONTRAST:  62m OMNIPAQUE IOHEXOL 300 MG/ML  SOLN COMPARISON:  Chest radiograph June 15, 2007 FINDINGS: Mediastinum/Lymph Nodes: There is no demonstrable anterior mediastinal mass. No evidence in particular of thymoma. There is no demonstrable thoracic adenopathy. A few tiny lymph nodes in the mediastinum do not meet size criteria for pathologic significance. There is no major vessel pulmonary embolus. There is atherosclerotic calcification in the aorta without aneurysm or dissection appreciable. There is mild calcification at the origins of the visualized great vessels. The visualize great vessels otherwise appear unremarkable. There is no appreciable  pericardial thickening. There are scattered foci of coronary artery calcification. Visualized thyroid appears normal. Lungs/Pleura: There is no lung edema or consolidation. There is no pleural effusion or pleural thickening. Upper abdomen: In the visualized upper abdomen, there is calcification in portions of the upper abdominal aorta and splenic artery. There is moderate calcification at the origin of the celiac axis and origin of the superior mesenteric artery. Visualized upper abdomen otherwise appears unremarkable. Musculoskeletal: There is degenerative change in the thoracic spine noted. There are no blastic or lytic bone lesions. There is a focal calcified disc extrusion centrally at T8-9. This disc extrusion abuts and slightly effaces the ventral cord at this level. IMPRESSION: No demonstrable thymoma. No mediastinal mass appreciable. No adenopathy. No lung edema or consolidation. Multiple foci of atherosclerotic calcification. There are areas of coronary artery calcification. There are small mediastinal lymph nodes that do not meet size criteria for pathologic significance. No thoracic adenopathy by size criteria. Central disc extrusion at T8-9 in the midline causing impression on the  ventral thoracic cord focally. No high-grade stenosis. Electronically Signed   By: Lowella Grip III M.D.   On: 10/28/2015 10:07       Assessment & Plan:   Problem List Items Addressed This Visit    Diabetes mellitus ([place])    Sugars as outlined.  On prednisone.  Continue same medication.  Follow met b and a1c.        Hypercholesterolemia    Low cholesterol diet and exercise.  Follow lipid panel.        HYPERTENSION, BENIGN    Blood pressure under good control.  Continue same medication regimen.  Follow pressures.  Follow metabolic panel.        Hyponatremia    Follow sodium.       Myasthenia gravis ([place])    Undergoing treatment.  Followed by neurology.  Better.  CT chest as outlined.       Urinary frequency    Some urinary frequency and feeling as if not emptying bladder.  Rectal exam - fullness - right prostate lobe.  Check urinalysis.   Discussed urology referral.         Other Visit Diagnoses    Urinary hesitancy    -  Primary    Relevant Orders    Urinalysis, Routine w reflex microscopic (not at [facility]) (Completed)        Einar Pheasant, MD

## 2016-01-06 ENCOUNTER — Encounter: Payer: Self-pay | Admitting: *Deleted

## 2016-01-06 DIAGNOSIS — R3911 Hesitancy of micturition: Secondary | ICD-10-CM

## 2016-01-07 NOTE — Telephone Encounter (Signed)
Order placed for urology referral.  

## 2016-01-08 ENCOUNTER — Encounter: Payer: Self-pay | Admitting: Internal Medicine

## 2016-01-08 DIAGNOSIS — R350 Frequency of micturition: Secondary | ICD-10-CM | POA: Insufficient documentation

## 2016-01-08 NOTE — Assessment & Plan Note (Signed)
Low cholesterol diet and exercise.  Follow lipid panel.   

## 2016-01-08 NOTE — Assessment & Plan Note (Addendum)
Undergoing treatment.  Followed by neurology.  Better.  CT chest as outlined.

## 2016-01-08 NOTE — Assessment & Plan Note (Signed)
Sugars as outlined.  On prednisone.  Continue same medication.  Follow met b and a1c.

## 2016-01-08 NOTE — Assessment & Plan Note (Signed)
Blood pressure under good control.  Continue same medication regimen.  Follow pressures.  Follow metabolic panel.   

## 2016-01-08 NOTE — Assessment & Plan Note (Signed)
Some urinary frequency and feeling as if not emptying bladder.  Rectal exam - fullness - right prostate lobe.  Check urinalysis.   Discussed urology referral.

## 2016-01-08 NOTE — Assessment & Plan Note (Signed)
Follow sodium.  

## 2016-01-20 ENCOUNTER — Ambulatory Visit (INDEPENDENT_AMBULATORY_CARE_PROVIDER_SITE_OTHER): Payer: Medicare Other | Admitting: Urology

## 2016-01-20 ENCOUNTER — Encounter: Payer: Self-pay | Admitting: Urology

## 2016-01-20 VITALS — BP 158/77 | HR 82 | Ht 71.0 in | Wt 210.0 lb

## 2016-01-20 DIAGNOSIS — R350 Frequency of micturition: Secondary | ICD-10-CM

## 2016-01-20 LAB — BLADDER SCAN AMB NON-IMAGING: SCAN RESULT: 12

## 2016-01-20 MED ORDER — TAMSULOSIN HCL 0.4 MG PO CAPS: 0.4000 mg | ORAL_CAPSULE | Freq: Every day | ORAL | Status: DC

## 2016-01-20 NOTE — Progress Notes (Signed)
01/20/2016 3:32 PM   [name] [birth year] [id number]  Referring provider: Einar Pheasant, MD [id number] [place], [place] [address]  Chief Complaint  Patient presents with  . New Patient (Initial Visit)    urinary frequency     HPI: The patient is a 82 year old male who presents for evaluation of urinary hesitancy.The patient was recently diagnosed with myasthenia gravis and states that his systems began after starting pyridostigmine (which is an acetylcholinesterase inhibitor). The patient notes a weak stream and daytime frequency. He has nocturia 1. He feels that his frequency has weak stream are his biggest complaints at this time. He is never taken any medications for his urination before. He has no other urological complaints at this time. His I PSS score today is 11/3. PVR is 21 cc.   PMH: Past Medical History  Diagnosis Date  . Diabetes mellitus ([place])   . HTN (hypertension)   . Hypercholesterolemia   . Urinary outflow obstruction     mild  . GERD (gastroesophageal reflux disease)   . BPH (benign prostatic hypertrophy)   . Skin cancer   . Degenerative arthritis   . Allergic rhinitis   . Iron deficiency anemia   . HYPERTENSION, BENIGN 08/15/2010    Qualifier: Diagnosis of  By: Rockey Situ MD, Tim    . TIA (transient ischemic attack) 09/13/2015    Surgical History: Past Surgical History  Procedure Laterality Date  . Pilonidal cyst excision  1951    removal  . Rotator cuff repair  1995  . Tonsillectomy and adenoidectomy  1938  . Skin cancer excision      multiple    Home Medications:    Medication List       This list is accurate as of: 01/20/16  3:32 PM.  Always use your most recent med list.               ACCU-CHEK SOFTCLIX LANCETS lancets  Use as instructed     ASPIR-LOW 81 MG EC tablet  Generic drug:  aspirin  Take 81 mg by mouth daily.     CELLCEPT 500 MG tablet  Generic drug:  mycophenolate  Take 1,000 mg by  mouth 2 (two) times daily.     cyanocobalamin 1000 MCG/ML injection  Commonly known as:  (VITAMIN B-12)  Inject 1,000 mcg into the muscle every 30 (thirty) days.     docusate sodium 100 MG capsule  Commonly known as:  COLACE  Take 100 mg by mouth 2 (two) times daily.     glucose blood test strip  Use once daily to check blood sugars Dx 250.00 Accu-check Compact plus strips     lovastatin 20 MG tablet  Commonly known as:  MEVACOR  Take 1 tablet (20 mg total) by mouth at bedtime.     magnesium oxide 400 MG tablet  Commonly known as:  MAG-OX  Take 400 mg by mouth daily.     OSCAL 500/200 D-3 500-200 MG-UNIT tablet  Generic drug:  calcium-vitamin D  Take 1 tablet by mouth 2 (two) times daily.     predniSONE 10 MG tablet  Commonly known as:  DELTASONE  Take 30 mg by mouth daily with breakfast.     pyridostigmine 60 MG tablet  Commonly known as:  MESTINON  Take 1/2 tablet tid     tamsulosin 0.4 MG Caps capsule  Commonly known as:  FLOMAX  Take 1 capsule (0.4 mg total) by mouth daily.  valsartan-hydrochlorothiazide 320-12.5 MG tablet  Commonly known as:  DIOVAN-HCT  Take 1 tablet by mouth daily.        Allergies: No Known Allergies  Family History: Family History  Problem Relation Age of Onset  . Heart disease Father     heart atack  . Alzheimer's disease Mother   . CVA Brother     Social History:  reports that he has never smoked. He has never used smokeless tobacco. He reports that he does not drink alcohol or use illicit drugs.  ROS: UROLOGY Frequent Urination?: Yes Hard to postpone urination?: Yes Burning/pain with urination?: No Get up at night to urinate?: Yes Leakage of urine?: Yes Urine stream starts and stops?: Yes Trouble starting stream?: No Do you have to strain to urinate?: No Blood in urine?: No Urinary tract infection?: No Sexually transmitted disease?: No Injury to kidneys or bladder?: No Painful intercourse?: No Weak stream?:  No Erection problems?: Yes Penile pain?: No  Gastrointestinal Nausea?: No Vomiting?: No Indigestion/heartburn?: No Diarrhea?: No Constipation?: No  Constitutional Fever: No Night sweats?: No Weight loss?: No Fatigue?: Yes  Skin Skin rash/lesions?: No Itching?: No  Eyes Blurred vision?: No Double vision?: Yes  Ears/Nose/Throat Sore throat?: No Sinus problems?: No  Hematologic/Lymphatic Swollen glands?: No Easy bruising?: No  Cardiovascular Leg swelling?: No Chest pain?: No  Respiratory Cough?: No Shortness of breath?: No  Endocrine Excessive thirst?: No  Musculoskeletal Back pain?: No Joint pain?: No  Neurological Headaches?: No Dizziness?: No  Psychologic Depression?: No Anxiety?: No  Physical Exam: BP 158/77 mmHg  Pulse 82  Ht 5\' 11"  (1.803 m)  Wt 210 lb (95.255 kg)  BMI 29.30 kg/m2  Constitutional:  Alert and oriented, No acute distress. HEENT: [place] AT, moist mucus membranes.  Trachea midline, no masses. Cardiovascular: No clubbing, cyanosis, or edema. Respiratory: Normal respiratory effort, no increased work of breathing. GI: Abdomen is soft, nontender, nondistended, no abdominal masses GU: No CVA tenderness. Normal phallus. Testicles descended equal bilaterally. No masses. DRE: 2+, smooth no nodules. Benign. Skin: No rashes, bruises or suspicious lesions. Lymph: No cervical or inguinal adenopathy. Neurologic: Grossly intact, no focal deficits, moving all 4 extremities. Psychiatric: Normal mood and affect.  Laboratory Data: Lab Results  Component Value Date   WBC 8.0 09/12/2015   HGB 13.0 09/12/2015   HCT 38.5* 09/12/2015   MCV 93.5 09/12/2015   PLT 240 09/12/2015    Lab Results  Component Value Date   CREATININE 0.80 10/28/2015    Lab Results  Component Value Date   PSA 0.52 07/07/2015   PSA 0.45 06/02/2014   PSA 0.39 05/14/2013    No results found for: TESTOSTERONE  Lab Results  Component Value Date   HGBA1C 6.9*  09/12/2015    Urinalysis    Component Value Date/Time   COLORURINE YELLOW 01/05/2016 1509   APPEARANCEUR CLEAR 01/05/2016 1509   LABSPEC 1.020 01/05/2016 1509   PHURINE 6.0 01/05/2016 1509   GLUCOSEU 250* 01/05/2016 1509   HGBUR NEGATIVE 01/05/2016 1509   BILIRUBINUR NEGATIVE 01/05/2016 1509   KETONESUR TRACE* 01/05/2016 1509   UROBILINOGEN 0.2 01/05/2016 1509   NITRITE NEGATIVE 01/05/2016 1509   LEUKOCYTESUR NEGATIVE 01/05/2016 1509    Assessment & Plan:    1. BPH We will start the patient on Flomax 0.4 mg daily. He was warned of risk of orthostatic hypotension. His urinary frequency also is likely exacerbated by his acetylcholinesterase inhibitor,, but he needs to stand this for his myasthenia gravis. Hopefully his Flomax will  make it easier for him to void. He'll follow-up in 3 months.  Return in about 3 months (around 04/21/2016).  Nickie Retort, MD  [facility] [address] [place], [place] [address] [phone]

## 2016-01-21 LAB — URINALYSIS, COMPLETE
BILIRUBIN UA: NEGATIVE
KETONES UA: NEGATIVE
LEUKOCYTES UA: NEGATIVE
Nitrite, UA: NEGATIVE
Protein, UA: NEGATIVE
SPEC GRAV UA: 1.02 (ref 1.005–1.030)
Urobilinogen, Ur: 0.2 mg/dL (ref 0.2–1.0)
pH, UA: 6 (ref 5.0–7.5)

## 2016-01-21 LAB — MICROSCOPIC EXAMINATION
Bacteria, UA: NONE SEEN
EPITHELIAL CELLS (NON RENAL): NONE SEEN /HPF (ref 0–10)

## 2016-01-24 DIAGNOSIS — M5136 Other intervertebral disc degeneration, lumbar region: Secondary | ICD-10-CM | POA: Diagnosis not present

## 2016-01-24 DIAGNOSIS — M955 Acquired deformity of pelvis: Secondary | ICD-10-CM | POA: Diagnosis not present

## 2016-01-24 DIAGNOSIS — M9903 Segmental and somatic dysfunction of lumbar region: Secondary | ICD-10-CM | POA: Diagnosis not present

## 2016-01-24 DIAGNOSIS — M9905 Segmental and somatic dysfunction of pelvic region: Secondary | ICD-10-CM | POA: Diagnosis not present

## 2016-02-16 DIAGNOSIS — G7000 Myasthenia gravis without (acute) exacerbation: Secondary | ICD-10-CM | POA: Diagnosis not present

## 2016-02-21 DIAGNOSIS — M955 Acquired deformity of pelvis: Secondary | ICD-10-CM | POA: Diagnosis not present

## 2016-02-21 DIAGNOSIS — M9903 Segmental and somatic dysfunction of lumbar region: Secondary | ICD-10-CM | POA: Diagnosis not present

## 2016-02-21 DIAGNOSIS — M5136 Other intervertebral disc degeneration, lumbar region: Secondary | ICD-10-CM | POA: Diagnosis not present

## 2016-02-21 DIAGNOSIS — M9905 Segmental and somatic dysfunction of pelvic region: Secondary | ICD-10-CM | POA: Diagnosis not present

## 2016-02-23 DIAGNOSIS — H532 Diplopia: Secondary | ICD-10-CM | POA: Diagnosis not present

## 2016-02-27 ENCOUNTER — Other Ambulatory Visit: Payer: Self-pay | Admitting: Internal Medicine

## 2016-03-03 IMAGING — CT CT CHEST W/ CM
1 series · 15 of 33 positions shown, 19 images · IV contrast (omnipaque)
Comparison: Chest radiograph June 15, 2007

CLINICAL DATA: New onset myasthenia gravis

EXAM:
CT CHEST WITH CONTRAST
TECHNIQUE: Multidetector CT imaging of the chest was performed during
intravenous contrast administration.
CONTRAST:  75mL OMNIPAQUE IOHEXOL 300 MG/ML  SOLN

[Series 2: routine chest with · axial · 0.70mm/px · z∈[-26,+249]mm · 15 of 65 slices shown, 19 images]
[im 5/65  mediastinal]
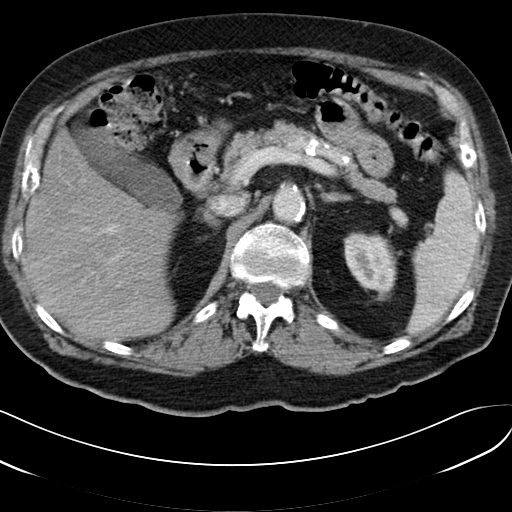
[im 5/65  lung]
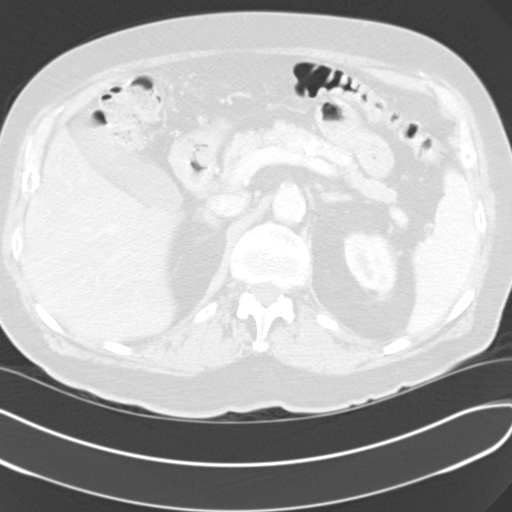
[im 10/65  lung]
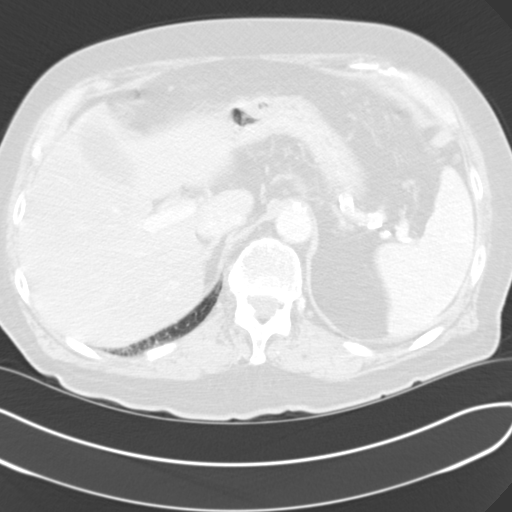
[im 13/65  lung]
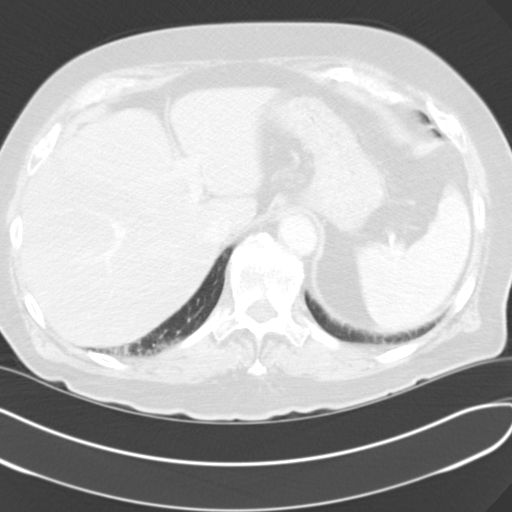
[im 17/65  lung]
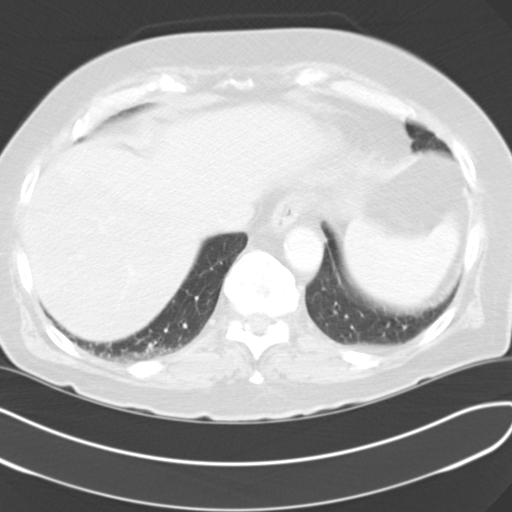
[im 22/65  mediastinal]
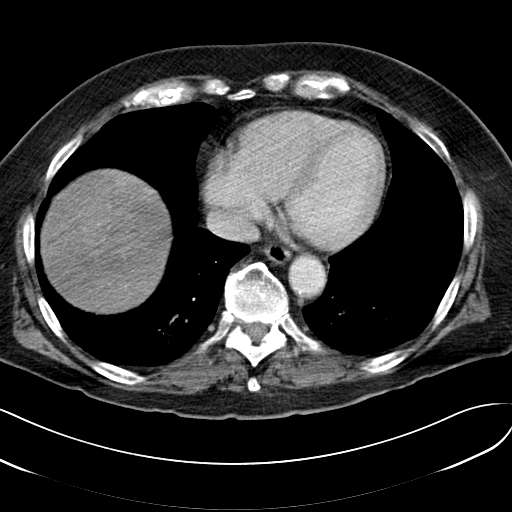
[im 22/65  lung]
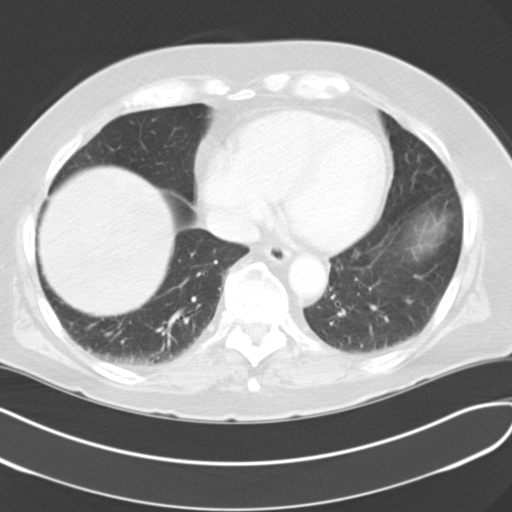
[im 26/65  lung]
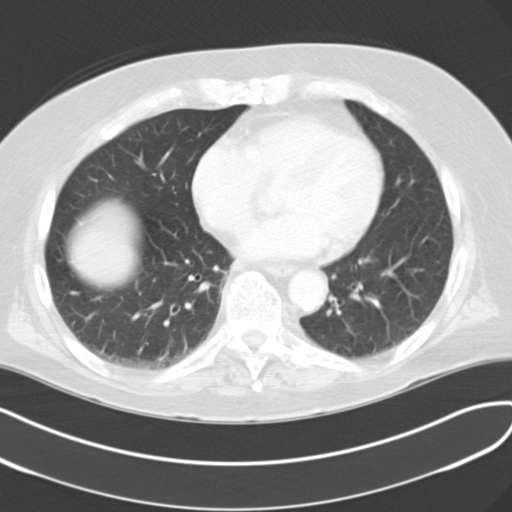
[im 29/65  lung]
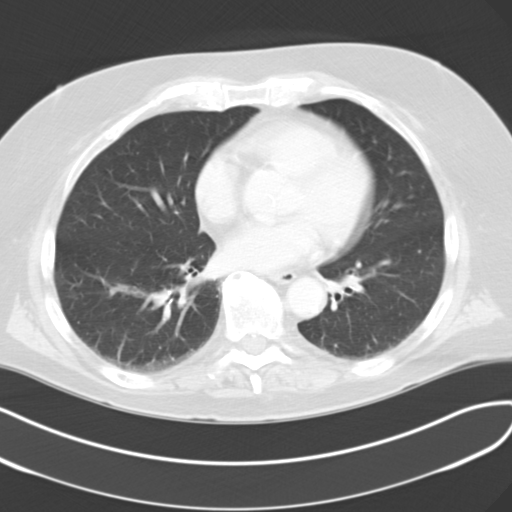
[im 34/65  lung]
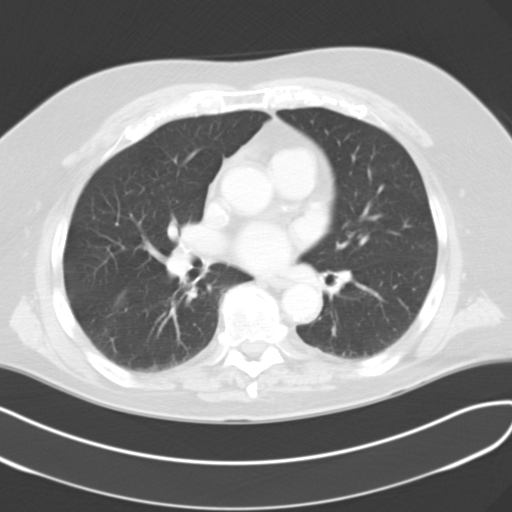
[im 36/65  mediastinal]
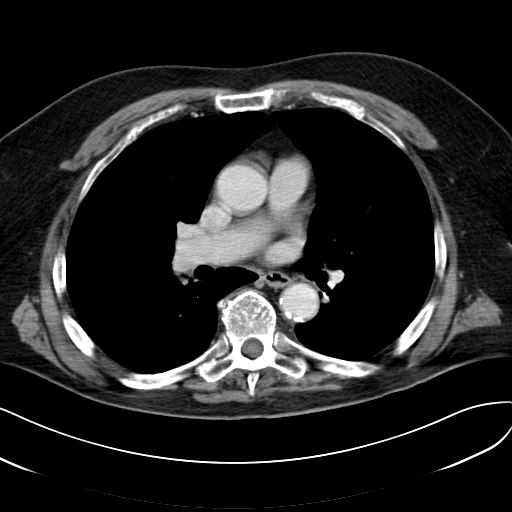
[im 36/65  lung]
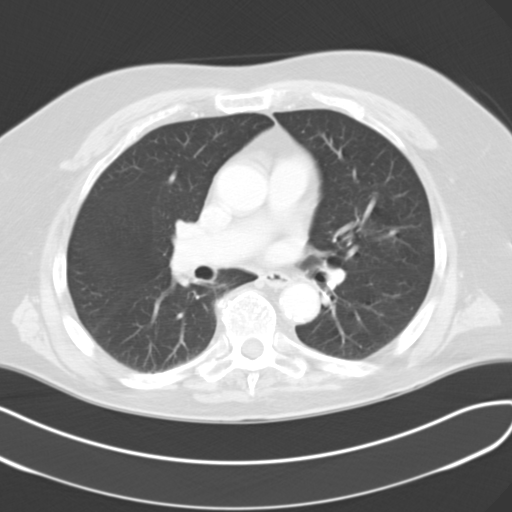
[im 39/65  lung]
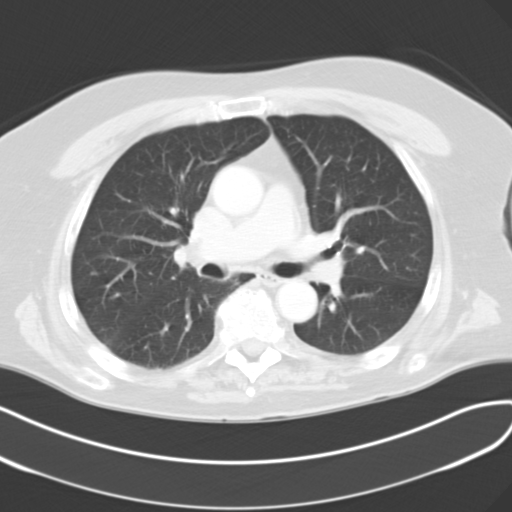
[im 43/65  lung]
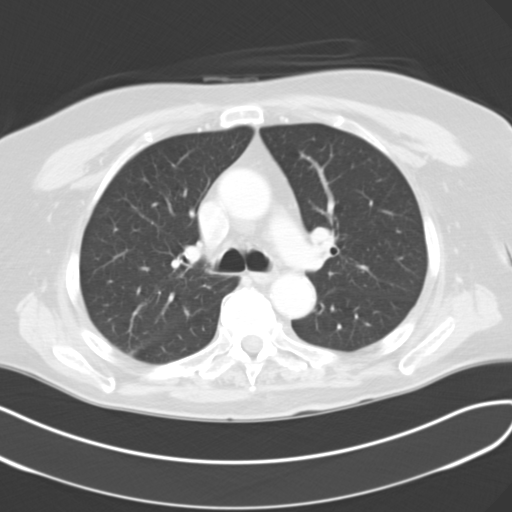
[im 48/65  lung]
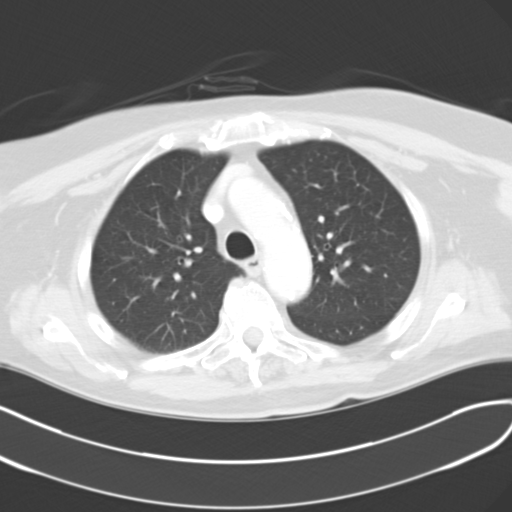
[im 52/65  mediastinal]
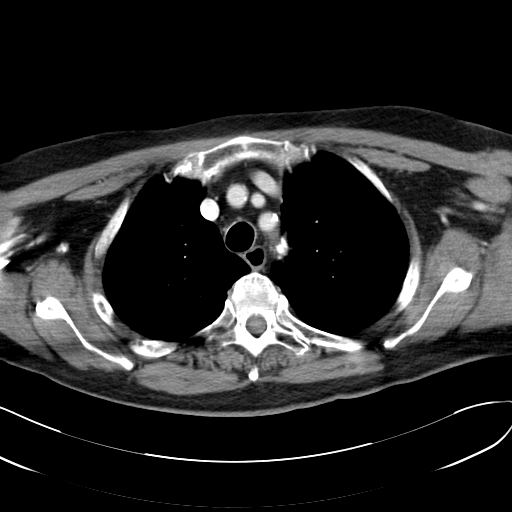
[im 52/65  lung]
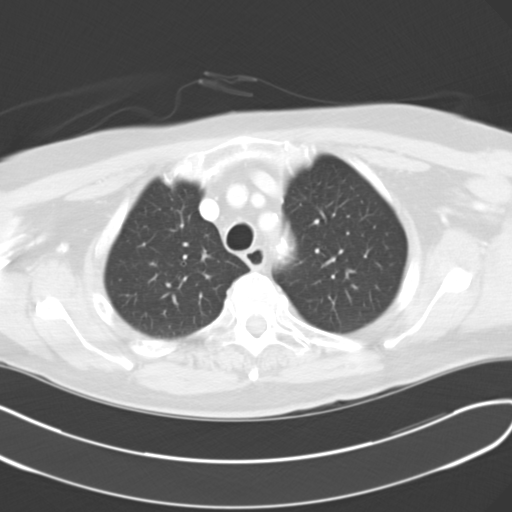
[im 55/65  lung]
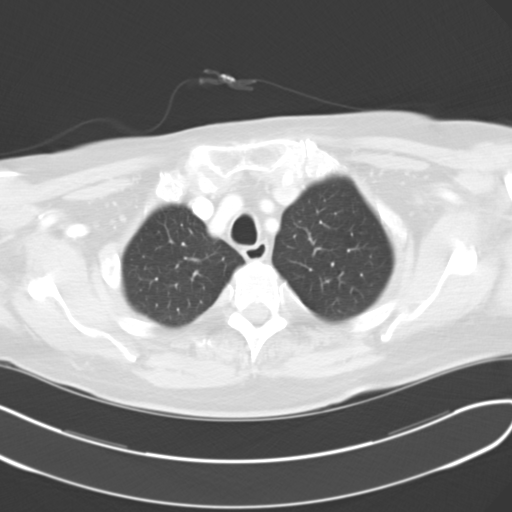
[im 60/65  lung]
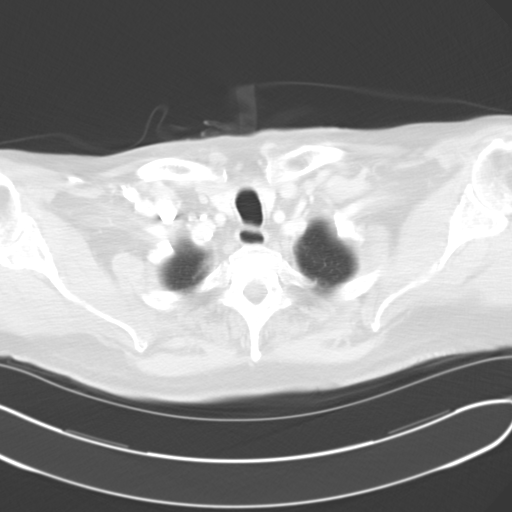

[15 of 33 positions shown; findings below may reference images not displayed]

FINDINGS: Mediastinum/Lymph Nodes: There is no demonstrable anterior
mediastinal mass. No evidence in particular of thymoma. There is no
demonstrable thoracic adenopathy. A few tiny lymph nodes in the
mediastinum do not meet size criteria for pathologic significance.
There is no major vessel pulmonary embolus. There is atherosclerotic
calcification in the aorta without aneurysm or dissection
appreciable. There is mild calcification at the origins of the
visualized great vessels. The visualize great vessels otherwise
appear unremarkable. There is no appreciable pericardial thickening.
There are scattered foci of coronary artery calcification.
Visualized thyroid appears normal.

Lungs/Pleura: There is no lung edema or consolidation. There is no
pleural effusion or pleural thickening.

Upper abdomen: In the visualized upper abdomen, there is
calcification in portions of the upper abdominal aorta and splenic
artery. There is moderate calcification at the origin of the celiac
axis and origin of the superior mesenteric artery.

Visualized upper abdomen otherwise appears unremarkable.

Musculoskeletal: There is degenerative change in the thoracic spine
noted. There are no blastic or lytic bone lesions. There is a focal
calcified disc extrusion centrally at T8-9. This disc extrusion
abuts and slightly effaces the ventral cord at this level.
IMPRESSION: No demonstrable thymoma. No mediastinal mass appreciable. No
adenopathy.

No lung edema or consolidation.

Multiple foci of atherosclerotic calcification. There are areas of
coronary artery calcification.

There are small mediastinal lymph nodes that do not meet size
criteria for pathologic significance. No thoracic adenopathy by size
criteria.

Central disc extrusion at T8-9 in the midline causing impression on
the ventral thoracic cord focally. No high-grade stenosis.

## 2016-03-22 ENCOUNTER — Encounter: Payer: Self-pay | Admitting: Internal Medicine

## 2016-03-22 ENCOUNTER — Ambulatory Visit (INDEPENDENT_AMBULATORY_CARE_PROVIDER_SITE_OTHER): Payer: Medicare Other | Admitting: Internal Medicine

## 2016-03-22 VITALS — BP 120/50 | HR 67 | Temp 98.0°F | Resp 18 | Ht 71.0 in | Wt 210.1 lb

## 2016-03-22 DIAGNOSIS — E7800 Pure hypercholesterolemia, unspecified: Secondary | ICD-10-CM

## 2016-03-22 DIAGNOSIS — R350 Frequency of micturition: Secondary | ICD-10-CM

## 2016-03-22 DIAGNOSIS — D649 Anemia, unspecified: Secondary | ICD-10-CM

## 2016-03-22 DIAGNOSIS — E119 Type 2 diabetes mellitus without complications: Secondary | ICD-10-CM | POA: Diagnosis not present

## 2016-03-22 DIAGNOSIS — I10 Essential (primary) hypertension: Secondary | ICD-10-CM

## 2016-03-22 DIAGNOSIS — G7000 Myasthenia gravis without (acute) exacerbation: Secondary | ICD-10-CM

## 2016-03-22 NOTE — Progress Notes (Signed)
Patient ID: [name], male   DOB: 1926/04/29, 82 y.o.   MRN: [id number]   Subjective:    Patient ID: [name], male    DOB: 08-04-1926, 82 y.o.   MRN: [id number]  HPI  Patient here for a scheduled follow up.  He is being followed by Dr Earle Gell for his myasthenia.  On prednisone.  Dose being tapered.  Doing better with movement and activity.  Chewing and eating better.  States sugars are averaging 130-150.  No chest pain.  Breathing stable.  No abdominal pain or cramping.  Bowels stable.  Saw urology.   BPH - started on flomax.  Urinating more now.     Past Medical History  Diagnosis Date  . Diabetes mellitus ([place])   . HTN (hypertension)   . Hypercholesterolemia   . Urinary outflow obstruction     mild  . GERD (gastroesophageal reflux disease)   . BPH (benign prostatic hypertrophy)   . Skin cancer   . Degenerative arthritis   . Allergic rhinitis   . Iron deficiency anemia   . HYPERTENSION, BENIGN 08/15/2010    Qualifier: Diagnosis of  By: Rockey Situ MD, Tim    . TIA (transient ischemic attack) 09/13/2015   Past Surgical History  Procedure Laterality Date  . Pilonidal cyst excision  1951    removal  . Rotator cuff repair  1995  . Tonsillectomy and adenoidectomy  1938  . Skin cancer excision      multiple   Family History  Problem Relation Age of Onset  . Heart disease Father     heart atack  . Alzheimer's disease Mother   . CVA Brother    Social History   Social History  . Marital Status: Married    Spouse Name: N/A  . Number of Children: N/A  . Years of Education: N/A   Social History Main Topics  . Smoking status: Never Smoker   . Smokeless tobacco: Never Used  . Alcohol Use: No  . Drug Use: No  . Sexual Activity: Not Asked   Other Topics Concern  . None   Social History Narrative   Retired, married. Walks everyday          Outpatient Encounter Prescriptions as of 03/22/2016  Medication Sig  . ACCU-CHEK SOFTCLIX LANCETS lancets Use as  instructed  . aspirin (ASPIR-LOW) 81 MG EC tablet Take 81 mg by mouth daily.    . calcium-vitamin D (OSCAL 500/200 D-3) 500-200 MG-UNIT per tablet Take 1 tablet by mouth 2 (two) times daily.   . cyanocobalamin (,VITAMIN B-12,) 1000 MCG/ML injection Inject 1,000 mcg into the muscle every 30 (thirty) days.  Marland Kitchen docusate sodium (COLACE) 100 MG capsule Take 100 mg by mouth 2 (two) times daily.  Marland Kitchen glucose blood test strip Use once daily to check blood sugars Dx 250.00 Accu-check Compact plus strips  . lovastatin (MEVACOR) 20 MG tablet TAKE ONE TABLET AT BEDTIME  . magnesium oxide (MAG-OX) 400 MG tablet Take 400 mg by mouth daily.  . mycophenolate (CELLCEPT) 500 MG tablet Take 1,000 mg by mouth 2 (two) times daily.  . predniSONE (DELTASONE) 10 MG tablet Take 20 mg by mouth daily with breakfast. Reported on 03/22/2016  . pyridostigmine (MESTINON) 60 MG tablet Take 1/2 tablet tid (Patient taking differently: Take 60 mg by mouth 3 (three) times daily. Take 1/2 tablet tid)  . tamsulosin (FLOMAX) 0.4 MG CAPS capsule Take 1 capsule (0.4 mg total) by mouth daily.  . valsartan-hydrochlorothiazide (DIOVAN-HCT) 320-12.5  MG tablet Take 1 tablet by mouth daily.   No facility-administered encounter medications on file as of 03/22/2016.    Review of Systems  Constitutional: Negative for appetite change and unexpected weight change.  HENT: Negative for congestion and sinus pressure.   Respiratory: Negative for cough, chest tightness and shortness of breath.   Cardiovascular: Negative for chest pain, palpitations and leg swelling.  Gastrointestinal: Negative for nausea, vomiting, abdominal pain and diarrhea.  Genitourinary:       On flomax now.  Urinating more.    Musculoskeletal: Negative for myalgias and joint swelling.  Skin: Negative for color change and rash.  Neurological: Negative for dizziness, light-headedness and headaches.  Psychiatric/Behavioral: Negative for dysphoric mood and agitation.         Objective:    Physical Exam  Constitutional: He appears well-developed and well-nourished. No distress.  HENT:  Nose: Nose normal.  Mouth/Throat: Oropharynx is clear and moist.  Neck: Neck supple. No thyromegaly present.  Cardiovascular: Normal rate and regular rhythm.   Pulmonary/Chest: Effort normal and breath sounds normal. No respiratory distress.  Abdominal: Soft. Bowel sounds are normal. There is no tenderness.  Musculoskeletal: He exhibits no edema or tenderness.  Lymphadenopathy:    He has no cervical adenopathy.  Skin: No rash noted. No erythema.  Psychiatric: He has a normal mood and affect. His behavior is normal.    BP 120/50 mmHg  Pulse 67  Temp(Src) 98 F (36.7 C) (Oral)  Resp 18  Ht 5\' 11"  (1.803 m)  Wt 210 lb 2 oz (95.312 kg)  BMI 29.32 kg/m2  SpO2 95% Wt Readings from Last 3 Encounters:  03/22/16 210 lb 2 oz (95.312 kg)  01/20/16 210 lb (95.255 kg)  01/05/16 211 lb (95.709 kg)     Lab Results  Component Value Date   WBC 8.0 09/12/2015   HGB 13.0 09/12/2015   HCT 38.5* 09/12/2015   PLT 240 09/12/2015   GLUCOSE 147* 03/23/2016   CHOL 157 03/23/2016   TRIG 101.0 03/23/2016   HDL 51.30 03/23/2016   LDLCALC 86 03/23/2016   ALT 15 03/23/2016   AST 14 03/23/2016   NA 140 03/23/2016   K 4.2 03/23/2016   CL 106 03/23/2016   CREATININE 0.87 03/23/2016   BUN 31* 03/23/2016   CO2 28 03/23/2016   TSH 1.72 03/23/2016   PSA 0.52 07/07/2015   HGBA1C 8.8* 03/23/2016   MICROALBUR 1.4 03/23/2016    Ct Chest W Contrast  10/28/2015  CLINICAL DATA:  New onset myasthenia gravis EXAM: CT CHEST WITH CONTRAST TECHNIQUE: Multidetector CT imaging of the chest was performed during intravenous contrast administration. CONTRAST:  35mL OMNIPAQUE IOHEXOL 300 MG/ML  SOLN COMPARISON:  Chest radiograph June 15, 2007 FINDINGS: Mediastinum/Lymph Nodes: There is no demonstrable anterior mediastinal mass. No evidence in particular of thymoma. There is no demonstrable  thoracic adenopathy. A few tiny lymph nodes in the mediastinum do not meet size criteria for pathologic significance. There is no major vessel pulmonary embolus. There is atherosclerotic calcification in the aorta without aneurysm or dissection appreciable. There is mild calcification at the origins of the visualized great vessels. The visualize great vessels otherwise appear unremarkable. There is no appreciable pericardial thickening. There are scattered foci of coronary artery calcification. Visualized thyroid appears normal. Lungs/Pleura: There is no lung edema or consolidation. There is no pleural effusion or pleural thickening. Upper abdomen: In the visualized upper abdomen, there is calcification in portions of the upper abdominal aorta and splenic artery. There is  moderate calcification at the origin of the celiac axis and origin of the superior mesenteric artery. Visualized upper abdomen otherwise appears unremarkable. Musculoskeletal: There is degenerative change in the thoracic spine noted. There are no blastic or lytic bone lesions. There is a focal calcified disc extrusion centrally at T8-9. This disc extrusion abuts and slightly effaces the ventral cord at this level. IMPRESSION: No demonstrable thymoma. No mediastinal mass appreciable. No adenopathy. No lung edema or consolidation. Multiple foci of atherosclerotic calcification. There are areas of coronary artery calcification. There are small mediastinal lymph nodes that do not meet size criteria for pathologic significance. No thoracic adenopathy by size criteria. Central disc extrusion at T8-9 in the midline causing impression on the ventral thoracic cord focally. No high-grade stenosis. Electronically Signed   By: Lowella Grip III M.D.   On: 10/28/2015 10:07       Assessment & Plan:   Problem List Items Addressed This Visit    Anemia    Follow cbc.        Diabetes mellitus ([place]) - Primary    Sugars as outlined.  Did not bring in  any readings.  Tapering prednisone.  Follow sugars.  Check metabolic panel and A999333.        Relevant Orders   Hemoglobin A1c (Completed)   Basic metabolic panel (Completed)   Microalbumin / creatinine urine ratio (Completed)   Hypercholesterolemia    Low cholesterol diet and exercise.  On lovastatin.  Follow lipid panel and liver function tests.       Relevant Orders   Hepatic function panel (Completed)   Lipid panel (Completed)   HYPERTENSION, BENIGN    Blood pressure under good control.  Continue same medication regimen.  Follow pressures.  Follow metabolic panel.        Relevant Orders   TSH (Completed)   Myasthenia gravis ([place])    Seeing neurology.  On cellcept, prednisone and mestinon.  Moving better.  Tapering prednisone.  Follow.       Urinary frequency    Seeing urology. Diagnosed with BPH.  Saw urology.  See note.  No flomax.           Einar Pheasant, MD

## 2016-03-22 NOTE — Progress Notes (Signed)
Pre-visit discussion using our clinic review tool. No additional management support is needed unless otherwise documented below in the visit note.  

## 2016-03-23 ENCOUNTER — Other Ambulatory Visit (INDEPENDENT_AMBULATORY_CARE_PROVIDER_SITE_OTHER): Payer: Medicare Other

## 2016-03-23 DIAGNOSIS — E119 Type 2 diabetes mellitus without complications: Secondary | ICD-10-CM | POA: Diagnosis not present

## 2016-03-23 DIAGNOSIS — E7800 Pure hypercholesterolemia, unspecified: Secondary | ICD-10-CM | POA: Diagnosis not present

## 2016-03-23 DIAGNOSIS — I10 Essential (primary) hypertension: Secondary | ICD-10-CM | POA: Diagnosis not present

## 2016-03-23 LAB — LIPID PANEL
Cholesterol: 157 mg/dL (ref 0–200)
HDL: 51.3 mg/dL (ref >39.00)
LDL Cholesterol: 86 mg/dL (ref 0–99)
NONHDL: 105.77
Total CHOL/HDL Ratio: 3
Triglycerides: 101 mg/dL (ref 0.0–149.0)
VLDL: 20.2 mg/dL (ref 0.0–40.0)

## 2016-03-23 LAB — BASIC METABOLIC PANEL
BUN: 31 mg/dL — AB (ref 6–23)
CALCIUM: 9.5 mg/dL (ref 8.4–10.5)
CO2: 28 mEq/L (ref 19–32)
Chloride: 106 mEq/L (ref 96–112)
Creatinine, Ser: 0.87 mg/dL (ref 0.40–1.50)
GFR: 88.13 mL/min (ref >60.00)
GLUCOSE: 147 mg/dL — AB (ref 70–99)
POTASSIUM: 4.2 meq/L (ref 3.5–5.1)
SODIUM: 140 meq/L (ref 135–145)

## 2016-03-23 LAB — HEPATIC FUNCTION PANEL
ALBUMIN: 3.9 g/dL (ref 3.5–5.2)
ALK PHOS: 23 U/L — AB (ref 39–117)
ALT: 15 U/L (ref 0–53)
AST: 14 U/L (ref 0–37)
Bilirubin, Direct: 0.1 mg/dL (ref 0.0–0.3)
Total Bilirubin: 0.8 mg/dL (ref 0.2–1.2)
Total Protein: 6.8 g/dL (ref 6.0–8.3)

## 2016-03-23 LAB — HEMOGLOBIN A1C: Hgb A1c MFr Bld: 8.8 % — ABNORMAL HIGH (ref 4.6–6.5)

## 2016-03-23 LAB — MICROALBUMIN / CREATININE URINE RATIO
Creatinine,U: 179.1 mg/dL
MICROALB UR: 1.4 mg/dL (ref 0.0–1.9)
MICROALB/CREAT RATIO: 0.8 mg/g (ref 0.0–30.0)

## 2016-03-23 LAB — TSH: TSH: 1.72 u[IU]/mL (ref 0.35–4.50)

## 2016-03-25 ENCOUNTER — Encounter: Payer: Self-pay | Admitting: Internal Medicine

## 2016-03-26 ENCOUNTER — Encounter: Payer: Self-pay | Admitting: Internal Medicine

## 2016-03-26 NOTE — Assessment & Plan Note (Signed)
Low cholesterol diet and exercise.  On lovastatin.  Follow lipid panel and liver function tests.   

## 2016-03-26 NOTE — Assessment & Plan Note (Signed)
Seeing urology. Diagnosed with BPH.  Saw urology.  See note.  No flomax.

## 2016-03-26 NOTE — Assessment & Plan Note (Signed)
Follow cbc.  

## 2016-03-26 NOTE — Assessment & Plan Note (Signed)
Seeing neurology.  On cellcept, prednisone and mestinon.  Moving better.  Tapering prednisone.  Follow.

## 2016-03-26 NOTE — Assessment & Plan Note (Signed)
Blood pressure under good control.  Continue same medication regimen.  Follow pressures.  Follow metabolic panel.   

## 2016-03-26 NOTE — Assessment & Plan Note (Signed)
Sugars as outlined.  Did not bring in any readings.  Tapering prednisone.  Follow sugars.  Check metabolic panel and A999333.

## 2016-03-27 DIAGNOSIS — M9905 Segmental and somatic dysfunction of pelvic region: Secondary | ICD-10-CM | POA: Diagnosis not present

## 2016-03-27 DIAGNOSIS — M955 Acquired deformity of pelvis: Secondary | ICD-10-CM | POA: Diagnosis not present

## 2016-03-27 DIAGNOSIS — M5136 Other intervertebral disc degeneration, lumbar region: Secondary | ICD-10-CM | POA: Diagnosis not present

## 2016-03-27 DIAGNOSIS — M9903 Segmental and somatic dysfunction of lumbar region: Secondary | ICD-10-CM | POA: Diagnosis not present

## 2016-03-28 NOTE — Telephone Encounter (Signed)
Unread mychart message mailed to patient 

## 2016-04-09 DIAGNOSIS — L820 Inflamed seborrheic keratosis: Secondary | ICD-10-CM | POA: Diagnosis not present

## 2016-04-09 DIAGNOSIS — L821 Other seborrheic keratosis: Secondary | ICD-10-CM | POA: Diagnosis not present

## 2016-04-09 DIAGNOSIS — D692 Other nonthrombocytopenic purpura: Secondary | ICD-10-CM | POA: Diagnosis not present

## 2016-04-15 ENCOUNTER — Encounter: Payer: Self-pay | Admitting: Internal Medicine

## 2016-04-19 NOTE — Telephone Encounter (Signed)
Unread mychart message mailed to patient 

## 2016-04-26 ENCOUNTER — Ambulatory Visit: Payer: Medicare Other

## 2016-04-26 DIAGNOSIS — G7000 Myasthenia gravis without (acute) exacerbation: Secondary | ICD-10-CM | POA: Diagnosis not present

## 2016-04-26 DIAGNOSIS — Z5181 Encounter for therapeutic drug level monitoring: Secondary | ICD-10-CM | POA: Diagnosis not present

## 2016-05-01 DIAGNOSIS — M9903 Segmental and somatic dysfunction of lumbar region: Secondary | ICD-10-CM | POA: Diagnosis not present

## 2016-05-01 DIAGNOSIS — M5136 Other intervertebral disc degeneration, lumbar region: Secondary | ICD-10-CM | POA: Diagnosis not present

## 2016-05-01 DIAGNOSIS — M9905 Segmental and somatic dysfunction of pelvic region: Secondary | ICD-10-CM | POA: Diagnosis not present

## 2016-05-01 DIAGNOSIS — M955 Acquired deformity of pelvis: Secondary | ICD-10-CM | POA: Diagnosis not present

## 2016-05-03 ENCOUNTER — Ambulatory Visit (INDEPENDENT_AMBULATORY_CARE_PROVIDER_SITE_OTHER): Payer: Medicare Other | Admitting: *Deleted

## 2016-05-03 DIAGNOSIS — E538 Deficiency of other specified B group vitamins: Secondary | ICD-10-CM | POA: Diagnosis not present

## 2016-05-03 MED ORDER — CYANOCOBALAMIN 1000 MCG/ML IJ SOLN
1000.0000 ug | Freq: Once | INTRAMUSCULAR | Status: AC
  Administered 2016-05-03: 1000 ug via INTRAMUSCULAR

## 2016-05-04 ENCOUNTER — Ambulatory Visit: Payer: Medicare Other

## 2016-05-07 ENCOUNTER — Ambulatory Visit: Payer: Medicare Other

## 2016-05-14 DIAGNOSIS — M955 Acquired deformity of pelvis: Secondary | ICD-10-CM | POA: Diagnosis not present

## 2016-05-14 DIAGNOSIS — M5136 Other intervertebral disc degeneration, lumbar region: Secondary | ICD-10-CM | POA: Diagnosis not present

## 2016-05-14 DIAGNOSIS — M9905 Segmental and somatic dysfunction of pelvic region: Secondary | ICD-10-CM | POA: Diagnosis not present

## 2016-05-14 DIAGNOSIS — M9903 Segmental and somatic dysfunction of lumbar region: Secondary | ICD-10-CM | POA: Diagnosis not present

## 2016-05-16 DIAGNOSIS — L820 Inflamed seborrheic keratosis: Secondary | ICD-10-CM | POA: Diagnosis not present

## 2016-05-16 DIAGNOSIS — M955 Acquired deformity of pelvis: Secondary | ICD-10-CM | POA: Diagnosis not present

## 2016-05-16 DIAGNOSIS — M9905 Segmental and somatic dysfunction of pelvic region: Secondary | ICD-10-CM | POA: Diagnosis not present

## 2016-05-16 DIAGNOSIS — L578 Other skin changes due to chronic exposure to nonionizing radiation: Secondary | ICD-10-CM | POA: Diagnosis not present

## 2016-05-16 DIAGNOSIS — M9903 Segmental and somatic dysfunction of lumbar region: Secondary | ICD-10-CM | POA: Diagnosis not present

## 2016-05-16 DIAGNOSIS — D485 Neoplasm of uncertain behavior of skin: Secondary | ICD-10-CM | POA: Diagnosis not present

## 2016-05-16 DIAGNOSIS — L570 Actinic keratosis: Secondary | ICD-10-CM | POA: Diagnosis not present

## 2016-05-16 DIAGNOSIS — C4442 Squamous cell carcinoma of skin of scalp and neck: Secondary | ICD-10-CM | POA: Diagnosis not present

## 2016-05-16 DIAGNOSIS — M5136 Other intervertebral disc degeneration, lumbar region: Secondary | ICD-10-CM | POA: Diagnosis not present

## 2016-05-18 ENCOUNTER — Ambulatory Visit (INDEPENDENT_AMBULATORY_CARE_PROVIDER_SITE_OTHER): Payer: Medicare Other | Admitting: Urology

## 2016-05-18 ENCOUNTER — Encounter: Payer: Self-pay | Admitting: Urology

## 2016-05-18 VITALS — BP 123/76 | HR 76 | Ht 71.0 in | Wt 215.0 lb

## 2016-05-18 DIAGNOSIS — R350 Frequency of micturition: Secondary | ICD-10-CM | POA: Diagnosis not present

## 2016-05-18 DIAGNOSIS — N400 Enlarged prostate without lower urinary tract symptoms: Secondary | ICD-10-CM

## 2016-05-18 MED ORDER — FINASTERIDE 5 MG PO TABS: 5.0000 mg | ORAL_TABLET | Freq: Every day | ORAL | Status: DC

## 2016-05-18 MED ORDER — TAMSULOSIN HCL 0.4 MG PO CAPS: 0.4000 mg | ORAL_CAPSULE | Freq: Two times a day (BID) | ORAL | Status: DC

## 2016-05-18 NOTE — Progress Notes (Signed)
05/18/2016 4:15 PM   [name] January 28, 1926 [id number]  Referring provider: Einar Pheasant, MD [id number] [place], [place] [address]  Chief Complaint  Patient presents with  . Follow-up    BPH    HPI: The patient is an 82 year old gentleman with BPH on Flomax as well as myasthenia gravis gravis on pyridostigmine (acetylcholinesterase inhibitor) presents for follow-up after starting Flomax at his last visit.  He has noted a 25% improvement since his last visit. He saw as a feeling of incomplete emptying, frequency, intermittency and a weak stream. He has nocturia 1. His I PSS score is 12/2. He feels like he has improved somewhat but would like further improvement.  Rectal exam was 2+ smooth and benign at his last visit.  PMH: Past Medical History  Diagnosis Date  . Diabetes mellitus ([place])   . HTN (hypertension)   . Hypercholesterolemia   . Urinary outflow obstruction     mild  . GERD (gastroesophageal reflux disease)   . BPH (benign prostatic hypertrophy)   . Skin cancer   . Degenerative arthritis   . Allergic rhinitis   . Iron deficiency anemia   . HYPERTENSION, BENIGN 08/15/2010    Qualifier: Diagnosis of  By: Rockey Situ MD, Tim    . TIA (transient ischemic attack) 09/13/2015    Surgical History: Past Surgical History  Procedure Laterality Date  . Pilonidal cyst excision  1951    removal  . Rotator cuff repair  1995  . Tonsillectomy and adenoidectomy  1938  . Skin cancer excision      multiple    Home Medications:    Medication List       This list is accurate as of: 05/18/16  4:15 PM.  Always use your most recent med list.               ACCU-CHEK SOFTCLIX LANCETS lancets  Use as instructed     ASPIR-LOW 81 MG EC tablet  Generic drug:  aspirin  Take 81 mg by mouth daily.     CELLCEPT 500 MG tablet  Generic drug:  mycophenolate  Take 1,000 mg by mouth 2 (two) times daily.     cyanocobalamin 1000 MCG/ML injection    Commonly known as:  (VITAMIN B-12)  Inject 1,000 mcg into the muscle every 30 (thirty) days.     docusate sodium 100 MG capsule  Commonly known as:  COLACE  Take 100 mg by mouth 2 (two) times daily. Reported on 05/18/2016     finasteride 5 MG tablet  Commonly known as:  PROSCAR  Take 1 tablet (5 mg total) by mouth daily.     glucose blood test strip  Use once daily to check blood sugars Dx 250.00 Accu-check Compact plus strips     lovastatin 20 MG tablet  Commonly known as:  MEVACOR  TAKE ONE TABLET AT BEDTIME     OSCAL 500/200 D-3 500-200 MG-UNIT tablet  Generic drug:  calcium-vitamin D  Take 1 tablet by mouth 2 (two) times daily.     predniSONE 10 MG tablet  Commonly known as:  DELTASONE  Take 20 mg by mouth daily with breakfast. Reported on 03/22/2016     pyridostigmine 60 MG tablet  Commonly known as:  MESTINON  Take 1/2 tablet tid     tamsulosin 0.4 MG Caps capsule  Commonly known as:  FLOMAX  Take 1 capsule (0.4 mg total) by mouth 2 (two) times daily.     valsartan-hydrochlorothiazide 320-12.5  MG tablet  Commonly known as:  DIOVAN-HCT  Take 1 tablet by mouth daily.        Allergies: No Known Allergies  Family History: Family History  Problem Relation Age of Onset  . Heart disease Father     heart atack  . Alzheimer's disease Mother   . CVA Brother     Social History:  reports that he has never smoked. He has never used smokeless tobacco. He reports that he does not drink alcohol or use illicit drugs.  ROS: UROLOGY Frequent Urination?: Yes Hard to postpone urination?: No Burning/pain with urination?: No Get up at night to urinate?: Yes Leakage of urine?: Yes Urine stream starts and stops?: Yes Trouble starting stream?: No Do you have to strain to urinate?: No Blood in urine?: No Urinary tract infection?: No Sexually transmitted disease?: No Injury to kidneys or bladder?: No Painful intercourse?: No Weak stream?: Yes Erection problems?:  No Penile pain?: No  Gastrointestinal Nausea?: No Vomiting?: No Indigestion/heartburn?: No Diarrhea?: No Constipation?: No  Constitutional Fever: No Night sweats?: No Weight loss?: No Fatigue?: No  Skin Skin rash/lesions?: No Itching?: No  Eyes Blurred vision?: No Double vision?: No  Ears/Nose/Throat Sinus problems?: No  Hematologic/Lymphatic Swollen glands?: No Easy bruising?: No                    Physical Exam: BP 123/76 mmHg  Pulse 76  Ht 5\' 11"  (1.803 m)  Wt 215 lb (97.523 kg)  BMI 30.00 kg/m2  Constitutional:  Alert and oriented, No acute distress. HEENT: [place] AT, moist mucus membranes.  Trachea midline, no masses. Cardiovascular: No clubbing, cyanosis, or edema. Respiratory: Normal respiratory effort, no increased work of breathing. GI: Abdomen is soft, nontender, nondistended, no abdominal masses GU: No CVA tenderness.  Skin: No rashes, bruises or suspicious lesions. Lymph: No cervical or inguinal adenopathy. Neurologic: Grossly intact, no focal deficits, moving all 4 extremities. Psychiatric: Normal mood and affect.  Laboratory Data: Lab Results  Component Value Date   WBC 8.0 09/12/2015   HGB 13.0 09/12/2015   HCT 38.5* 09/12/2015   MCV 93.5 09/12/2015   PLT 240 09/12/2015    Lab Results  Component Value Date   CREATININE 0.87 03/23/2016    Lab Results  Component Value Date   PSA 0.52 07/07/2015   PSA 0.45 06/02/2014   PSA 0.39 05/14/2013    No results found for: TESTOSTERONE  Lab Results  Component Value Date   HGBA1C 8.8* 03/23/2016    Urinalysis    Component Value Date/Time   COLORURINE YELLOW 01/05/2016 1509   APPEARANCEUR Clear 01/20/2016 1506   APPEARANCEUR CLEAR 01/05/2016 1509   LABSPEC 1.020 01/05/2016 1509   PHURINE 6.0 01/05/2016 1509   GLUCOSEU Trace* 01/20/2016 1506   GLUCOSEU 250* 01/05/2016 1509   HGBUR NEGATIVE 01/05/2016 1509   BILIRUBINUR Negative 01/20/2016 1506   BILIRUBINUR NEGATIVE  01/05/2016 1509   KETONESUR TRACE* 01/05/2016 1509   PROTEINUR Negative 01/20/2016 1506   UROBILINOGEN 0.2 01/05/2016 1509   NITRITE Negative 01/20/2016 1506   NITRITE NEGATIVE 01/05/2016 1509   LEUKOCYTESUR Negative 01/20/2016 1506   LEUKOCYTESUR NEGATIVE 01/05/2016 1509      Assessment & Plan:    1. BPH -Increase Flomax to 0.4 mg twice a day -Will add finasteride 5 mg daily -Follow up in 3 months to assess his progress   Return in about 3 months (around 08/18/2016).  Nickie Retort, MD  [facility] [address], [address]  [place], [place] [address] [phone]

## 2016-05-21 DIAGNOSIS — M9903 Segmental and somatic dysfunction of lumbar region: Secondary | ICD-10-CM | POA: Diagnosis not present

## 2016-05-21 DIAGNOSIS — M5136 Other intervertebral disc degeneration, lumbar region: Secondary | ICD-10-CM | POA: Diagnosis not present

## 2016-05-21 DIAGNOSIS — M955 Acquired deformity of pelvis: Secondary | ICD-10-CM | POA: Diagnosis not present

## 2016-05-21 DIAGNOSIS — M9905 Segmental and somatic dysfunction of pelvic region: Secondary | ICD-10-CM | POA: Diagnosis not present

## 2016-05-29 ENCOUNTER — Encounter: Payer: Self-pay | Admitting: Cardiovascular Disease

## 2016-05-29 DIAGNOSIS — M955 Acquired deformity of pelvis: Secondary | ICD-10-CM | POA: Diagnosis not present

## 2016-05-29 DIAGNOSIS — M5136 Other intervertebral disc degeneration, lumbar region: Secondary | ICD-10-CM | POA: Diagnosis not present

## 2016-05-29 DIAGNOSIS — M9905 Segmental and somatic dysfunction of pelvic region: Secondary | ICD-10-CM | POA: Diagnosis not present

## 2016-05-29 DIAGNOSIS — M9903 Segmental and somatic dysfunction of lumbar region: Secondary | ICD-10-CM | POA: Diagnosis not present

## 2016-06-06 DIAGNOSIS — M955 Acquired deformity of pelvis: Secondary | ICD-10-CM | POA: Diagnosis not present

## 2016-06-06 DIAGNOSIS — M9905 Segmental and somatic dysfunction of pelvic region: Secondary | ICD-10-CM | POA: Diagnosis not present

## 2016-06-06 DIAGNOSIS — M9903 Segmental and somatic dysfunction of lumbar region: Secondary | ICD-10-CM | POA: Diagnosis not present

## 2016-06-06 DIAGNOSIS — M5136 Other intervertebral disc degeneration, lumbar region: Secondary | ICD-10-CM | POA: Diagnosis not present

## 2016-06-12 ENCOUNTER — Other Ambulatory Visit: Payer: Self-pay | Admitting: Internal Medicine

## 2016-06-21 ENCOUNTER — Ambulatory Visit (INDEPENDENT_AMBULATORY_CARE_PROVIDER_SITE_OTHER): Payer: Medicare Other | Admitting: Internal Medicine

## 2016-06-21 ENCOUNTER — Encounter: Payer: Self-pay | Admitting: Internal Medicine

## 2016-06-21 VITALS — BP 140/58 | HR 65 | Wt 220.0 lb

## 2016-06-21 DIAGNOSIS — G7000 Myasthenia gravis without (acute) exacerbation: Secondary | ICD-10-CM

## 2016-06-21 DIAGNOSIS — E538 Deficiency of other specified B group vitamins: Secondary | ICD-10-CM

## 2016-06-21 DIAGNOSIS — N400 Enlarged prostate without lower urinary tract symptoms: Secondary | ICD-10-CM

## 2016-06-21 DIAGNOSIS — E669 Obesity, unspecified: Secondary | ICD-10-CM | POA: Diagnosis not present

## 2016-06-21 DIAGNOSIS — E7800 Pure hypercholesterolemia, unspecified: Secondary | ICD-10-CM

## 2016-06-21 DIAGNOSIS — E119 Type 2 diabetes mellitus without complications: Secondary | ICD-10-CM

## 2016-06-21 DIAGNOSIS — I10 Essential (primary) hypertension: Secondary | ICD-10-CM | POA: Diagnosis not present

## 2016-06-21 DIAGNOSIS — D649 Anemia, unspecified: Secondary | ICD-10-CM

## 2016-06-21 MED ORDER — CYANOCOBALAMIN 1000 MCG/ML IJ SOLN
1000.0000 ug | Freq: Once | INTRAMUSCULAR | Status: AC
  Administered 2016-06-21: 1000 ug via INTRAMUSCULAR

## 2016-06-21 NOTE — Progress Notes (Signed)
Patient ID: [name], male   DOB: [birth year], 82 y.o.   MRN: [id number]   Subjective:    Patient ID: [name], male    DOB: July 06, 1926, 82 y.o.   MRN: [id number]  HPI  Patient here for a scheduled follow up.  Sees Dr Earle Gell for his myasthenia.  Doing better.  Increased stress with his wife's medical issues.  Taking care of her.  We discussed today.  Overall he feels he is handling things relatively well.  Home health following.  States sugars doing better.  AM sugars averaging 120-130.  Tapering prednisone.  Breathing stable.  No chest pain.  No abdominal pain or cramping.  Bowels stable.  On flomax. Helping.  Followed by urology.     Past Medical History:  Diagnosis Date  . Allergic rhinitis   . BPH (benign prostatic hypertrophy)   . Degenerative arthritis   . Diabetes mellitus ([place])   . GERD (gastroesophageal reflux disease)   . HTN (hypertension)   . Hypercholesterolemia   . HYPERTENSION, BENIGN 08/15/2010   Qualifier: Diagnosis of  By: Rockey Situ MD, Tim    . Iron deficiency anemia   . Skin cancer   . TIA (transient ischemic attack) 09/13/2015  . Urinary outflow obstruction    mild   Past Surgical History:  Procedure Laterality Date  . PILONIDAL CYST EXCISION  1951   removal  . [place]  . SKIN CANCER EXCISION     multiple  . TONSILLECTOMY AND ADENOIDECTOMY  1938   Family History  Problem Relation Age of Onset  . Heart disease Father     heart atack  . Alzheimer's disease Mother   . CVA Brother    Social History   Social History  . Marital status: Married    Spouse name: N/A  . Number of children: N/A  . Years of education: N/A   Social History Main Topics  . Smoking status: Never Smoker  . Smokeless tobacco: Never Used  . Alcohol use No  . Drug use: No  . Sexual activity: Not Asked   Other Topics Concern  . None   Social History Narrative   Retired, married. Walks everyday          Outpatient Encounter  Prescriptions as of 06/21/2016  Medication Sig  . ACCU-CHEK SOFTCLIX LANCETS lancets Use as instructed  . aspirin (ASPIR-LOW) 81 MG EC tablet Take 81 mg by mouth daily.    . calcium-vitamin D (OSCAL 500/200 D-3) 500-200 MG-UNIT per tablet Take 1 tablet by mouth 2 (two) times daily.   . cyanocobalamin (,VITAMIN B-12,) 1000 MCG/ML injection Inject 1,000 mcg into the muscle every 30 (thirty) days.  Marland Kitchen docusate sodium (COLACE) 100 MG capsule Take 100 mg by mouth 2 (two) times daily. Reported on 05/18/2016  . finasteride (PROSCAR) 5 MG tablet Take 1 tablet (5 mg total) by mouth daily.  Marland Kitchen glucose blood test strip Use once daily to check blood sugars Dx 250.00 Accu-check Compact plus strips  . lovastatin (MEVACOR) 20 MG tablet TAKE ONE TABLET AT BEDTIME  . mycophenolate (CELLCEPT) 500 MG tablet Take 1,000 mg by mouth 2 (two) times daily.  . predniSONE (DELTASONE) 10 MG tablet Take 20 mg by mouth daily with breakfast. Reported on 03/22/2016  . pyridostigmine (MESTINON) 60 MG tablet Take 1/2 tablet tid (Patient taking differently: Take 60 mg by mouth 3 (three) times daily. Take 1/2 tablet tid)  . tamsulosin (FLOMAX) 0.4 MG CAPS capsule Take 1  capsule (0.4 mg total) by mouth 2 (two) times daily.  . valsartan-hydrochlorothiazide (DIOVAN-HCT) 320-12.5 MG tablet TAKE ONE (1) TABLET EACH DAY  . [EXPIRED] cyanocobalamin ((VITAMIN B-12)) injection 1,000 mcg    No facility-administered encounter medications on file as of 06/21/2016.     Review of Systems  Constitutional: Negative for appetite change and unexpected weight change.  HENT: Negative for congestion and sinus pressure.   Respiratory: Negative for cough, chest tightness and shortness of breath.   Cardiovascular: Negative for chest pain, palpitations and leg swelling.  Gastrointestinal: Negative for abdominal pain, diarrhea, nausea and vomiting.  Genitourinary:       Taking flomax.  Better.  Followed by urology.   Musculoskeletal: Negative for back  pain and joint swelling.  Skin: Negative for color change and rash.  Neurological: Negative for dizziness, light-headedness and headaches.  Psychiatric/Behavioral: Negative for agitation and dysphoric mood.       Increased stress as outlined.         Objective:    Physical Exam  Constitutional: He appears well-developed and well-nourished. No distress.  HENT:  Nose: Nose normal.  Mouth/Throat: Oropharynx is clear and moist.  Neck: Neck supple. No thyromegaly present.  Cardiovascular: Normal rate and regular rhythm.   Pulmonary/Chest: Effort normal and breath sounds normal. No respiratory distress.  Abdominal: Soft. Bowel sounds are normal. There is no tenderness.  Musculoskeletal: He exhibits no edema or tenderness.  No increased swelling.   Lymphadenopathy:    He has no cervical adenopathy.  Skin: No rash noted. No erythema.  Psychiatric: He has a normal mood and affect. His behavior is normal.    BP (!) 140/58   Pulse 65   Wt 220 lb (99.8 kg)   SpO2 97%   BMI 30.68 kg/m  Wt Readings from Last 3 Encounters:  06/21/16 220 lb (99.8 kg)  05/18/16 215 lb (97.5 kg)  03/22/16 210 lb 2 oz (95.3 kg)     Lab Results  Component Value Date   WBC 8.0 09/12/2015   HGB 13.0 09/12/2015   HCT 38.5 (L) 09/12/2015   PLT 240 09/12/2015   GLUCOSE 147 (H) 03/23/2016   CHOL 157 03/23/2016   TRIG 101.0 03/23/2016   HDL 51.30 03/23/2016   LDLCALC 86 03/23/2016   ALT 15 03/23/2016   AST 14 03/23/2016   NA 140 03/23/2016   K 4.2 03/23/2016   CL 106 03/23/2016   CREATININE 0.87 03/23/2016   BUN 31 (H) 03/23/2016   CO2 28 03/23/2016   TSH 1.72 03/23/2016   PSA 0.52 07/07/2015   HGBA1C 8.8 (H) 03/23/2016   MICROALBUR 1.4 03/23/2016    Ct Chest W Contrast  Result Date: 10/28/2015 CLINICAL DATA:  New onset myasthenia gravis EXAM: CT CHEST WITH CONTRAST TECHNIQUE: Multidetector CT imaging of the chest was performed during intravenous contrast administration. CONTRAST:  39m  OMNIPAQUE IOHEXOL 300 MG/ML  SOLN COMPARISON:  Chest radiograph June 15, 2007 FINDINGS: Mediastinum/Lymph Nodes: There is no demonstrable anterior mediastinal mass. No evidence in particular of thymoma. There is no demonstrable thoracic adenopathy. A few tiny lymph nodes in the mediastinum do not meet size criteria for pathologic significance. There is no major vessel pulmonary embolus. There is atherosclerotic calcification in the aorta without aneurysm or dissection appreciable. There is mild calcification at the origins of the visualized great vessels. The visualize great vessels otherwise appear unremarkable. There is no appreciable pericardial thickening. There are scattered foci of coronary artery calcification. Visualized thyroid appears normal. Lungs/Pleura: There  is no lung edema or consolidation. There is no pleural effusion or pleural thickening. Upper abdomen: In the visualized upper abdomen, there is calcification in portions of the upper abdominal aorta and splenic artery. There is moderate calcification at the origin of the celiac axis and origin of the superior mesenteric artery. Visualized upper abdomen otherwise appears unremarkable. Musculoskeletal: There is degenerative change in the thoracic spine noted. There are no blastic or lytic bone lesions. There is a focal calcified disc extrusion centrally at T8-9. This disc extrusion abuts and slightly effaces the ventral cord at this level. IMPRESSION: No demonstrable thymoma. No mediastinal mass appreciable. No adenopathy. No lung edema or consolidation. Multiple foci of atherosclerotic calcification. There are areas of coronary artery calcification. There are small mediastinal lymph nodes that do not meet size criteria for pathologic significance. No thoracic adenopathy by size criteria. Central disc extrusion at T8-9 in the midline causing impression on the ventral thoracic cord focally. No high-grade stenosis. Electronically Signed   By: Lowella Grip III M.D.   On: 10/28/2015 10:07       Assessment & Plan:   Problem List Items Addressed This Visit    Anemia    Last hgb check wnl.  Follow.       Relevant Medications   cyanocobalamin ((VITAMIN B-12)) injection 1,000 mcg (Completed)   B12 deficiency - Primary    B12 injections.       Relevant Medications   cyanocobalamin ((VITAMIN B-12)) injection 1,000 mcg (Completed)   BPH (benign prostatic hyperplasia)    Followed by urology.  Doing well on current regimen.  Follow.       Diabetes mellitus ([place])    Low carb diet and exercise.  Tapering prednisone.  States sugars averaging 120-130.  Doing better.  Follow met b and a1c.       Relevant Orders   Hemoglobin A1c   Hypercholesterolemia    On lovastatin.  Low cholesterol diet and exercise.  Follow lipid panel and liver function tests.        Relevant Orders   Lipid panel   Hepatic function panel   HYPERTENSION, BENIGN    Blood pressure under good control.  Continue same medication regimen.  Follow pressures.  Follow metabolic panel.        Relevant Orders   Basic metabolic panel   Myasthenia gravis ([place])    Followed by neurology.   On cellcept and mestinon.  Tapering prednisone.  Doing better.  Follow.        Relevant Orders   CBC with Differential/Platelet   Obesity (BMI 30-39.9)    Diet and exercise.         Other Visit Diagnoses   None.      Einar Pheasant, MD

## 2016-06-25 DIAGNOSIS — M9905 Segmental and somatic dysfunction of pelvic region: Secondary | ICD-10-CM | POA: Diagnosis not present

## 2016-06-25 DIAGNOSIS — M9903 Segmental and somatic dysfunction of lumbar region: Secondary | ICD-10-CM | POA: Diagnosis not present

## 2016-06-25 DIAGNOSIS — M5136 Other intervertebral disc degeneration, lumbar region: Secondary | ICD-10-CM | POA: Diagnosis not present

## 2016-06-25 DIAGNOSIS — M955 Acquired deformity of pelvis: Secondary | ICD-10-CM | POA: Diagnosis not present

## 2016-07-01 DIAGNOSIS — N400 Enlarged prostate without lower urinary tract symptoms: Secondary | ICD-10-CM | POA: Insufficient documentation

## 2016-07-01 NOTE — Assessment & Plan Note (Signed)
Followed by neurology.   On cellcept and mestinon.  Tapering prednisone.  Doing better.  Follow.

## 2016-07-01 NOTE — Assessment & Plan Note (Signed)
On lovastatin.  Low cholesterol diet and exercise.  Follow lipid panel and liver function tests.   

## 2016-07-01 NOTE — Assessment & Plan Note (Signed)
Low carb diet and exercise.  Tapering prednisone.  States sugars averaging 120-130.  Doing better.  Follow met b and a1c.

## 2016-07-01 NOTE — Assessment & Plan Note (Signed)
Last hgb check wnl.  Follow.  

## 2016-07-01 NOTE — Assessment & Plan Note (Signed)
Diet and exercise.   

## 2016-07-01 NOTE — Assessment & Plan Note (Signed)
B12 injections 

## 2016-07-01 NOTE — Assessment & Plan Note (Signed)
Blood pressure under good control.  Continue same medication regimen.  Follow pressures.  Follow metabolic panel.   

## 2016-07-01 NOTE — Assessment & Plan Note (Signed)
Followed by urology.  Doing well on current regimen.  Follow.

## 2016-07-02 ENCOUNTER — Telehealth: Payer: Self-pay | Admitting: Internal Medicine

## 2016-07-02 NOTE — Telephone Encounter (Signed)
Pt called wanting to have his records (chart) updated for medication pyridostigmine (MESTINON) 60 MG tablet in his chart it reads something else.  Pt states he takes the medication 1 tab TID. Thank you!

## 2016-07-02 NOTE — Telephone Encounter (Signed)
It is listed as he takes differently , the one tablet tid, already. thanks

## 2016-07-03 DIAGNOSIS — C4442 Squamous cell carcinoma of skin of scalp and neck: Secondary | ICD-10-CM | POA: Diagnosis not present

## 2016-07-12 DIAGNOSIS — H43813 Vitreous degeneration, bilateral: Secondary | ICD-10-CM | POA: Diagnosis not present

## 2016-07-12 LAB — HM DIABETES EYE EXAM

## 2016-07-13 ENCOUNTER — Encounter: Payer: Self-pay | Admitting: Internal Medicine

## 2016-07-16 DIAGNOSIS — M9905 Segmental and somatic dysfunction of pelvic region: Secondary | ICD-10-CM | POA: Diagnosis not present

## 2016-07-16 DIAGNOSIS — M5136 Other intervertebral disc degeneration, lumbar region: Secondary | ICD-10-CM | POA: Diagnosis not present

## 2016-07-16 DIAGNOSIS — M9903 Segmental and somatic dysfunction of lumbar region: Secondary | ICD-10-CM | POA: Diagnosis not present

## 2016-07-16 DIAGNOSIS — M955 Acquired deformity of pelvis: Secondary | ICD-10-CM | POA: Diagnosis not present

## 2016-07-17 DIAGNOSIS — C4442 Squamous cell carcinoma of skin of scalp and neck: Secondary | ICD-10-CM | POA: Diagnosis not present

## 2016-07-20 DIAGNOSIS — Z23 Encounter for immunization: Secondary | ICD-10-CM | POA: Diagnosis not present

## 2016-07-24 ENCOUNTER — Other Ambulatory Visit (INDEPENDENT_AMBULATORY_CARE_PROVIDER_SITE_OTHER): Payer: Medicare Other

## 2016-07-24 DIAGNOSIS — G7000 Myasthenia gravis without (acute) exacerbation: Secondary | ICD-10-CM | POA: Diagnosis not present

## 2016-07-24 DIAGNOSIS — E7800 Pure hypercholesterolemia, unspecified: Secondary | ICD-10-CM | POA: Diagnosis not present

## 2016-07-24 DIAGNOSIS — I10 Essential (primary) hypertension: Secondary | ICD-10-CM

## 2016-07-24 DIAGNOSIS — E119 Type 2 diabetes mellitus without complications: Secondary | ICD-10-CM | POA: Diagnosis not present

## 2016-07-24 LAB — CBC WITH DIFFERENTIAL/PLATELET
Basophils Absolute: 0 10*3/uL (ref 0.0–0.1)
Basophils Relative: 0.6 % (ref 0.0–3.0)
EOS PCT: 1.7 % (ref 0.0–5.0)
Eosinophils Absolute: 0.1 10*3/uL (ref 0.0–0.7)
HCT: 37.4 % — ABNORMAL LOW (ref 39.0–52.0)
Hemoglobin: 12.6 g/dL — ABNORMAL LOW (ref 13.0–17.0)
LYMPHS ABS: 1.5 10*3/uL (ref 0.7–4.0)
Lymphocytes Relative: 24.7 % (ref 12.0–46.0)
MCHC: 33.6 g/dL (ref 30.0–36.0)
MCV: 91.4 fl (ref 78.0–100.0)
MONO ABS: 0.6 10*3/uL (ref 0.1–1.0)
MONOS PCT: 9.3 % (ref 3.0–12.0)
NEUTROS ABS: 3.8 10*3/uL (ref 1.4–7.7)
NEUTROS PCT: 63.7 % (ref 43.0–77.0)
PLATELETS: 275 10*3/uL (ref 150.0–400.0)
RBC: 4.09 Mil/uL — ABNORMAL LOW (ref 4.22–5.81)
RDW: 13.4 % (ref 11.5–15.5)
WBC: 6 10*3/uL (ref 4.0–10.5)

## 2016-07-24 LAB — HEPATIC FUNCTION PANEL
ALK PHOS: 34 U/L — AB (ref 39–117)
ALT: 17 U/L (ref 0–53)
AST: 17 U/L (ref 0–37)
Albumin: 3.7 g/dL (ref 3.5–5.2)
BILIRUBIN DIRECT: 0.1 mg/dL (ref 0.0–0.3)
BILIRUBIN TOTAL: 0.8 mg/dL (ref 0.2–1.2)
Total Protein: 6.9 g/dL (ref 6.0–8.3)

## 2016-07-24 LAB — LIPID PANEL
CHOLESTEROL: 134 mg/dL (ref 0–200)
HDL: 59.2 mg/dL (ref >39.00)
LDL Cholesterol: 59 mg/dL (ref 0–99)
NONHDL: 75.29
Total CHOL/HDL Ratio: 2
Triglycerides: 80 mg/dL (ref 0.0–149.0)
VLDL: 16 mg/dL (ref 0.0–40.0)

## 2016-07-24 LAB — BASIC METABOLIC PANEL
BUN: 17 mg/dL (ref 6–23)
CHLORIDE: 101 meq/L (ref 96–112)
CO2: 31 meq/L (ref 19–32)
CREATININE: 0.77 mg/dL (ref 0.40–1.50)
Calcium: 9.2 mg/dL (ref 8.4–10.5)
GFR: 101.38 mL/min (ref >60.00)
GLUCOSE: 116 mg/dL — AB (ref 70–99)
Potassium: 4 mEq/L (ref 3.5–5.1)
Sodium: 137 mEq/L (ref 135–145)

## 2016-07-24 LAB — HEMOGLOBIN A1C: HEMOGLOBIN A1C: 7.8 % — AB (ref 4.6–6.5)

## 2016-07-25 ENCOUNTER — Encounter: Payer: Self-pay | Admitting: Internal Medicine

## 2016-07-25 ENCOUNTER — Ambulatory Visit (INDEPENDENT_AMBULATORY_CARE_PROVIDER_SITE_OTHER): Payer: Medicare Other | Admitting: Cardiovascular Disease

## 2016-07-25 ENCOUNTER — Encounter: Payer: Self-pay | Admitting: Cardiovascular Disease

## 2016-07-25 VITALS — BP 142/62 | HR 65 | Ht 71.0 in | Wt 224.5 lb

## 2016-07-25 DIAGNOSIS — E7800 Pure hypercholesterolemia, unspecified: Secondary | ICD-10-CM | POA: Diagnosis not present

## 2016-07-25 DIAGNOSIS — E119 Type 2 diabetes mellitus without complications: Secondary | ICD-10-CM | POA: Diagnosis not present

## 2016-07-25 DIAGNOSIS — I10 Essential (primary) hypertension: Secondary | ICD-10-CM | POA: Diagnosis not present

## 2016-07-25 DIAGNOSIS — R002 Palpitations: Secondary | ICD-10-CM

## 2016-07-25 DIAGNOSIS — E669 Obesity, unspecified: Secondary | ICD-10-CM

## 2016-07-25 DIAGNOSIS — G7000 Myasthenia gravis without (acute) exacerbation: Secondary | ICD-10-CM

## 2016-07-25 NOTE — Progress Notes (Signed)
Cardiology Office Note  Date:  07/25/2016   ID:  [name], DOB May 10, 1926, MRN [id number]  PCP:  Einar Pheasant, MD   Chief Complaint  Patient presents with  . other    1 yr f/u no complaints today. Meds reviewed verbally with pt.    HPI:  [name] is a pleasant 82 yo male with a h/o HTN, diabetes, hemoglobin A1c >7, obesity , hyperlipidemia, pernicious anemia, history of palpitations  Presenting for routine followup of his hypertension  In follow-up today, he reports having recent diagnosis of Myasthenia Gravis He had developed severe muscle weakness, arms, occular, facial Saw neurology Had treatment with Ig, had 5 treatments over 2 weeks, QOD Back up to normal level, 90% back to normal On cellcept since Jan  Weight up 9 pounds in the past several months  Reports having chronic mild sweating though symptoms worse in the past month Wakes up day and night with sweats On prednisone/cellcept and mestinon  Lab work reviewed with him  total chol 134, LDL 59 HBA1C 7.8  He continues to take care of his wife who has advanced Parkinson's Tearful during today's visit, has some help from family, significant stress Relatively sedentary as he is taking care of his wife  EKG on today's visit shows normal sinus rhythm with rate 65 bpm, no significant ST or T-wave changes   PMH:   has a past medical history of Allergic rhinitis; BPH (benign prostatic hypertrophy); Degenerative arthritis; Diabetes mellitus ([place]); GERD (gastroesophageal reflux disease); HTN (hypertension); Hypercholesterolemia; HYPERTENSION, BENIGN (08/15/2010); Iron deficiency anemia; Skin cancer; TIA (transient ischemic attack) (09/13/2015); and Urinary outflow obstruction.  PSH:    Past Surgical History:  Procedure Laterality Date  . PILONIDAL CYST EXCISION  1951   removal  . [place]  . SKIN CANCER EXCISION     multiple  . SQUAMOUS CELL CARCINOMA EXCISION     behind head  .  TONSILLECTOMY AND ADENOIDECTOMY  1938    Current Outpatient Prescriptions  Medication Sig Dispense Refill  . ACCU-CHEK SOFTCLIX LANCETS lancets Use as instructed 100 each 12  . aspirin (ASPIR-LOW) 81 MG EC tablet Take 81 mg by mouth daily.      . calcium-vitamin D (OSCAL 500/200 D-3) 500-200 MG-UNIT per tablet Take 1 tablet by mouth 2 (two) times daily.     . cyanocobalamin (,VITAMIN B-12,) 1000 MCG/ML injection Inject 1,000 mcg into the muscle every 30 (thirty) days.    Marland Kitchen docusate sodium (COLACE) 100 MG capsule Take 100 mg by mouth 2 (two) times daily. Reported on 05/18/2016    . finasteride (PROSCAR) 5 MG tablet Take 1 tablet (5 mg total) by mouth daily. 30 tablet 11  . glucose blood test strip Use once daily to check blood sugars Dx 250.00 Accu-check Compact plus strips 100 each 3  . lovastatin (MEVACOR) 20 MG tablet TAKE ONE TABLET AT BEDTIME 90 tablet 3  . mycophenolate (CELLCEPT) 500 MG tablet Take 1,000 mg by mouth 2 (two) times daily.    . predniSONE (DELTASONE) 10 MG tablet Take 10 mg by mouth daily with breakfast. Reported on 03/22/2016    . pyridostigmine (MESTINON) 60 MG tablet Take 1/2 tablet tid (Patient taking differently: Take 60 mg by mouth 3 (three) times daily. Take 1/2 tablet tid) 45 tablet 1  . tamsulosin (FLOMAX) 0.4 MG CAPS capsule Take 1 capsule (0.4 mg total) by mouth 2 (two) times daily. 60 capsule 11  . valsartan-hydrochlorothiazide (DIOVAN-HCT) 320-12.5 MG tablet TAKE  ONE (1) TABLET EACH DAY 90 tablet 1   No current facility-administered medications for this visit.      Allergies:   Review of patient's allergies indicates no known allergies.   Social History:  The patient  reports that he has never smoked. He has never used smokeless tobacco. He reports that he does not drink alcohol or use drugs.   Family History:   family history includes Alzheimer's disease in his mother; CVA in his brother; Heart disease in his father.    Review of Systems: Review of  Systems  Constitutional: Negative.   Respiratory: Negative.   Cardiovascular: Positive for leg swelling.  Gastrointestinal: Negative.   Musculoskeletal: Negative.   Neurological: Negative.   Psychiatric/Behavioral: Positive for depression.  All other systems reviewed and are negative.    PHYSICAL EXAM: VS:  BP (!) 142/62 (BP Location: Left Arm, Patient Position: Sitting, Cuff Size: Normal)   Pulse 65   Ht 5\' 11"  (1.803 m)   Wt 224 lb 8 oz (101.8 kg)   BMI 31.31 kg/m  , BMI Body mass index is 31.31 kg/m. GEN: Well nourished, well developed, in no acute distress , tearful at times when discussing his wife HEENT: normal  Neck: no JVD, carotid bruits, or masses Cardiac: RRR; no murmurs, rubs, or gallops,trace nonpitting b/l edema  Respiratory:  clear to auscultation bilaterally, normal work of breathing GI: soft, nontender, nondistended, + BS MS: no deformity or atrophy  Skin: warm and dry, no rash Neuro:  Strength and sensation are intact Psych: euthymic mood, full affect    Recent Labs: 03/23/2016: TSH 1.72 07/24/2016: ALT 17; BUN 17; Creatinine, Ser 0.77; Hemoglobin 12.6; Platelets 275.0; Potassium 4.0; Sodium 137    Lipid Panel Lab Results  Component Value Date   CHOL 134 07/24/2016   HDL 59.20 07/24/2016   LDLCALC 59 07/24/2016   TRIG 80.0 07/24/2016      Wt Readings from Last 3 Encounters:  07/25/16 224 lb 8 oz (101.8 kg)  06/21/16 220 lb (99.8 kg)  05/18/16 215 lb (97.5 kg)       ASSESSMENT AND PLAN:  Hypercholesterolemia Cholesterol is at goal on the current lipid regimen. No changes to the medications were made.  HYPERTENSION, BENIGN Blood pressure is well controlled on today's visit. No changes made to the medications.  Type 2 diabetes mellitus without complication, without long-term current use of insulin ([place]) We have encouraged continued exercise, careful diet management in an effort to lose weight.  Obesity (BMI 30.0-34.9) As above,  recommended low carbohydrate diet  Myasthenia gravis ([place]) Reports symptoms of myasthenia gravis have improved after treatment, up to 90% of normal  Adjustment disorder Tearful during today's visit, long discussion concerning the condition of his wife, his need to provide constant care. He does have some help from family, though finds it very stressful home   Total encounter time more than 25 minutes  Greater than 50% was spent in counseling and coordination of care with the patient   Disposition:   F/U  12 months   Orders Placed This Encounter  Procedures  . EKG 12-Lead     Signed, Esmond Plants, M.D., Ph.D. 07/25/2016  [place], [place]

## 2016-07-25 NOTE — Patient Instructions (Addendum)

## 2016-07-31 DIAGNOSIS — L820 Inflamed seborrheic keratosis: Secondary | ICD-10-CM | POA: Diagnosis not present

## 2016-07-31 DIAGNOSIS — L570 Actinic keratosis: Secondary | ICD-10-CM | POA: Diagnosis not present

## 2016-08-01 ENCOUNTER — Ambulatory Visit (INDEPENDENT_AMBULATORY_CARE_PROVIDER_SITE_OTHER): Payer: Medicare Other

## 2016-08-01 DIAGNOSIS — E538 Deficiency of other specified B group vitamins: Secondary | ICD-10-CM | POA: Diagnosis not present

## 2016-08-01 MED ORDER — CYANOCOBALAMIN 1000 MCG/ML IJ SOLN
1000.0000 ug | Freq: Once | INTRAMUSCULAR | Status: AC
  Administered 2016-08-01: 1000 ug via INTRAMUSCULAR

## 2016-08-01 NOTE — Progress Notes (Addendum)
Patient came in for B-12 injection. Patient was given Injection in left deltoid. Patient tolerated injection very well. Patient had no questions, comments, or concerns at this time.  Reviewed.  Dr Nicki Reaper

## 2016-08-07 DIAGNOSIS — M9905 Segmental and somatic dysfunction of pelvic region: Secondary | ICD-10-CM | POA: Diagnosis not present

## 2016-08-07 DIAGNOSIS — M5136 Other intervertebral disc degeneration, lumbar region: Secondary | ICD-10-CM | POA: Diagnosis not present

## 2016-08-07 DIAGNOSIS — M955 Acquired deformity of pelvis: Secondary | ICD-10-CM | POA: Diagnosis not present

## 2016-08-07 DIAGNOSIS — M9903 Segmental and somatic dysfunction of lumbar region: Secondary | ICD-10-CM | POA: Diagnosis not present

## 2016-08-23 ENCOUNTER — Ambulatory Visit (INDEPENDENT_AMBULATORY_CARE_PROVIDER_SITE_OTHER): Payer: Medicare Other | Admitting: Urology

## 2016-08-23 ENCOUNTER — Encounter: Payer: Self-pay | Admitting: Urology

## 2016-08-23 VITALS — BP 148/63 | HR 56 | Ht 71.0 in | Wt 225.4 lb

## 2016-08-23 DIAGNOSIS — N400 Enlarged prostate without lower urinary tract symptoms: Secondary | ICD-10-CM | POA: Diagnosis not present

## 2016-08-23 NOTE — Progress Notes (Signed)
08/23/2016 10:29 AM   [name] Mar 18, 1926 [id number]  Referring provider: Einar Pheasant, MD [id number] [place], [place] [id number]  Chief Complaint  Patient presents with  . Benign Prostatic Hypertrophy    HPI: The patient is an 82 year old gentleman with BPH on Flomax as well as myasthenia gravis gravis on pyridostigmine (acetylcholinesterase inhibitor) presents for follow-up.  At his last visit, he noted some improvement with Flomax but was hoping for a more drastic change. At that time, his Flomax was increased to twice a day and finasteride was added. He presents today to discuss his progress. His I PSS score is last visit was 12 which is improved from his prior visit. Today is 5/3. His biggest complaint is a feeling of incomplete emptying of his PVR 0. He does have some daytime frequency and nocturia 1. He would like his symptoms to improve more however he is not interested in any surgical intervention at this time.   PMH: Past Medical History:  Diagnosis Date  . Allergic rhinitis   . BPH (benign prostatic hypertrophy)   . Degenerative arthritis   . Diabetes mellitus ([place])   . GERD (gastroesophageal reflux disease)   . HTN (hypertension)   . Hypercholesterolemia   . HYPERTENSION, BENIGN 08/15/2010   Qualifier: Diagnosis of  By: Rockey Situ MD, Tim    . Iron deficiency anemia   . Skin cancer   . TIA (transient ischemic attack) 09/13/2015  . Urinary outflow obstruction    mild    Surgical History: Past Surgical History:  Procedure Laterality Date  . PILONIDAL CYST EXCISION  1951   removal  . [place]  . SKIN CANCER EXCISION     multiple  . SQUAMOUS CELL CARCINOMA EXCISION     behind head  . TONSILLECTOMY AND ADENOIDECTOMY  1938    Home Medications:    Medication List       Accurate as of 08/23/16 10:29 AM. Always use your most recent med list.          ACCU-CHEK SOFTCLIX LANCETS lancets Use as  instructed   ASPIR-LOW 81 MG EC tablet Generic drug:  aspirin Take 81 mg by mouth daily.   cyanocobalamin 1000 MCG/ML injection Commonly known as:  (VITAMIN B-12) Inject 1,000 mcg into the muscle every 30 (thirty) days.   docusate sodium 100 MG capsule Commonly known as:  COLACE Take 100 mg by mouth 2 (two) times daily. Reported on 05/18/2016   finasteride 5 MG tablet Commonly known as:  PROSCAR Take 1 tablet (5 mg total) by mouth daily.   glucose blood test strip Use once daily to check blood sugars Dx 250.00 Accu-check Compact plus strips   lovastatin 20 MG tablet Commonly known as:  MEVACOR TAKE ONE TABLET AT BEDTIME   mycophenolate 500 MG tablet Commonly known as:  CELLCEPT Take by mouth.   OSCAL 500/200 D-3 500-200 MG-UNIT tablet Generic drug:  calcium-vitamin D Take 1 tablet by mouth 2 (two) times daily.   predniSONE 10 MG tablet Commonly known as:  DELTASONE Take 10 mg by mouth daily with breakfast. Reported on 03/22/2016   pyridostigmine 60 MG tablet Commonly known as:  MESTINON Take 1/2 tablet tid   tamsulosin 0.4 MG Caps capsule Commonly known as:  FLOMAX Take 1 capsule (0.4 mg total) by mouth 2 (two) times daily.   valsartan-hydrochlorothiazide 320-12.5 MG tablet Commonly known as:  DIOVAN-HCT TAKE ONE (1) TABLET EACH DAY  Allergies: No Known Allergies  Family History: Family History  Problem Relation Age of Onset  . Heart disease Father     heart atack  . Alzheimer's disease Mother   . CVA Brother     Social History:  reports that he has never smoked. He has never used smokeless tobacco. He reports that he does not drink alcohol or use drugs.  ROS: UROLOGY Frequent Urination?: Yes Hard to postpone urination?: No Burning/pain with urination?: No Get up at night to urinate?: Yes Leakage of urine?: Yes Urine stream starts and stops?: Yes Trouble starting stream?: No Do you have to strain to urinate?: No Blood in urine?:  No Urinary tract infection?: No Sexually transmitted disease?: No Injury to kidneys or bladder?: No Painful intercourse?: No Weak stream?: No Erection problems?: No Penile pain?: No  Gastrointestinal Nausea?: No Vomiting?: No Indigestion/heartburn?: No Diarrhea?: No Constipation?: No  Constitutional Fever: No Night sweats?: Yes Weight loss?: No Fatigue?: No  Skin Skin rash/lesions?: No Itching?: No  Eyes Blurred vision?: No Double vision?: No  Ears/Nose/Throat Sore throat?: No Sinus problems?: No  Hematologic/Lymphatic Swollen glands?: No Easy bruising?: No  Cardiovascular Leg swelling?: No Chest pain?: No  Respiratory Cough?: No Shortness of breath?: No  Endocrine Excessive thirst?: No  Musculoskeletal Back pain?: No Joint pain?: No  Neurological Headaches?: No Dizziness?: No  Psychologic Depression?: No Anxiety?: No  Physical Exam: BP (!) 148/63   Pulse (!) 56   Ht 5\' 11"  (1.803 m)   Wt 225 lb 6.4 oz (102.2 kg)   BMI 31.44 kg/m   Constitutional:  Alert and oriented, No acute distress. HEENT: [place] AT, moist mucus membranes.  Trachea midline, no masses. Cardiovascular: No clubbing, cyanosis, or edema. Respiratory: Normal respiratory effort, no increased work of breathing. GI: Abdomen is soft, nontender, nondistended, no abdominal masses GU: No CVA tenderness.  Skin: No rashes, bruises or suspicious lesions. Lymph: No cervical or inguinal adenopathy. Neurologic: Grossly intact, no focal deficits, moving all 4 extremities. Psychiatric: Normal mood and affect.  Laboratory Data: Lab Results  Component Value Date   WBC 6.0 07/24/2016   HGB 12.6 (L) 07/24/2016   HCT 37.4 (L) 07/24/2016   MCV 91.4 07/24/2016   PLT 275.0 07/24/2016    Lab Results  Component Value Date   CREATININE 0.77 07/24/2016    Lab Results  Component Value Date   PSA 0.52 07/07/2015   PSA 0.45 06/02/2014   PSA 0.39 05/14/2013    No results found for:  TESTOSTERONE  Lab Results  Component Value Date   HGBA1C 7.8 (H) 07/24/2016    Urinalysis    Component Value Date/Time   COLORURINE YELLOW 01/05/2016 1509   APPEARANCEUR Clear 01/20/2016 1506   LABSPEC 1.020 01/05/2016 1509   PHURINE 6.0 01/05/2016 1509   GLUCOSEU Trace (A) 01/20/2016 1506   GLUCOSEU 250 (A) 01/05/2016 1509   HGBUR NEGATIVE 01/05/2016 1509   BILIRUBINUR Negative 01/20/2016 1506   KETONESUR TRACE (A) 01/05/2016 1509   PROTEINUR Negative 01/20/2016 1506   UROBILINOGEN 0.2 01/05/2016 1509   NITRITE Negative 01/20/2016 1506   NITRITE NEGATIVE 01/05/2016 1509   LEUKOCYTESUR Negative 01/20/2016 1506      Assessment & Plan:    1. BPH The patient will continue his Flomax twice a day as well as finasteride. I told him that based on his I PSS score issues his symptoms have quantitatively improved. I also said that any further improvement would necessitate surgery which he is adamantly not interested in. At  this time I feel we are at arm maximum benefit of medical therapy for his BPH. He expresses understanding of this. He will continue these medications and follow-up on an annual basis.  Return in about 1 year (around 08/23/2017).  Nickie Retort, MD  [facility] [address], [place], [place] [address] [phone]

## 2016-08-27 DIAGNOSIS — G7000 Myasthenia gravis without (acute) exacerbation: Secondary | ICD-10-CM | POA: Diagnosis not present

## 2016-08-27 DIAGNOSIS — Z5181 Encounter for therapeutic drug level monitoring: Secondary | ICD-10-CM | POA: Diagnosis not present

## 2016-08-29 ENCOUNTER — Ambulatory Visit (INDEPENDENT_AMBULATORY_CARE_PROVIDER_SITE_OTHER): Payer: Medicare Other

## 2016-08-29 DIAGNOSIS — E538 Deficiency of other specified B group vitamins: Secondary | ICD-10-CM

## 2016-08-29 LAB — CBC AND DIFFERENTIAL
HCT: 41 % (ref 41–53)
Hemoglobin: 13.5 g/dL (ref 13.5–17.5)
PLATELETS: 260 10*3/uL (ref 150–399)
WBC: 10.2 10^3/mL

## 2016-08-29 MED ORDER — CYANOCOBALAMIN 1000 MCG/ML IJ SOLN
1000.0000 ug | Freq: Once | INTRAMUSCULAR | Status: AC
  Administered 2016-08-29: 1000 ug via INTRAMUSCULAR

## 2016-08-29 NOTE — Progress Notes (Addendum)
Patient comes in for B 12 injection.  Injected  into  right deltoid patient tolerated well.  Reviewed.  Dr Nicki Reaper

## 2016-08-31 ENCOUNTER — Encounter: Payer: Self-pay | Admitting: Internal Medicine

## 2016-09-04 DIAGNOSIS — M955 Acquired deformity of pelvis: Secondary | ICD-10-CM | POA: Diagnosis not present

## 2016-09-04 DIAGNOSIS — M9905 Segmental and somatic dysfunction of pelvic region: Secondary | ICD-10-CM | POA: Diagnosis not present

## 2016-09-04 DIAGNOSIS — M9903 Segmental and somatic dysfunction of lumbar region: Secondary | ICD-10-CM | POA: Diagnosis not present

## 2016-09-04 DIAGNOSIS — M5136 Other intervertebral disc degeneration, lumbar region: Secondary | ICD-10-CM | POA: Diagnosis not present

## 2016-09-16 ENCOUNTER — Encounter: Payer: Self-pay | Admitting: Internal Medicine

## 2016-09-16 ENCOUNTER — Emergency Department
Admission: EM | Admit: 2016-09-16 | Discharge: 2016-09-16 | Disposition: A | Discharge status: Home / Self Care | Payer: Medicare Other | Attending: Emergency Medicine | Admitting: Emergency Medicine

## 2016-09-16 DIAGNOSIS — G8929 Other chronic pain: Secondary | ICD-10-CM | POA: Diagnosis not present

## 2016-09-16 DIAGNOSIS — Z79899 Other long term (current) drug therapy: Secondary | ICD-10-CM | POA: Diagnosis not present

## 2016-09-16 DIAGNOSIS — Z85828 Personal history of other malignant neoplasm of skin: Secondary | ICD-10-CM | POA: Diagnosis not present

## 2016-09-16 DIAGNOSIS — M545 Low back pain: Secondary | ICD-10-CM | POA: Insufficient documentation

## 2016-09-16 DIAGNOSIS — E119 Type 2 diabetes mellitus without complications: Secondary | ICD-10-CM | POA: Diagnosis not present

## 2016-09-16 DIAGNOSIS — I10 Essential (primary) hypertension: Secondary | ICD-10-CM | POA: Diagnosis not present

## 2016-09-16 DIAGNOSIS — Z7982 Long term (current) use of aspirin: Secondary | ICD-10-CM | POA: Diagnosis not present

## 2016-09-16 MED ORDER — LIDOCAINE 5 % EX PTCH: 1.0000 | MEDICATED_PATCH | Freq: Two times a day (BID) | CUTANEOUS | 1 refills | Status: DC

## 2016-09-16 NOTE — ED Triage Notes (Signed)
Pt says about 2 weeks ago he felt a pulling sensation across his lower back when he was turning his bed-bound wife; has been taking OTC medication and using a heating pad with no relief; says this am he had to use a rope to pull himself up out of bed; pt ambulatory without difficulty

## 2016-09-16 NOTE — Discharge Instructions (Signed)

## 2016-09-16 NOTE — ED Provider Notes (Signed)
[facility] Emergency Department Provider Note  ____________________________________________   First MD Initiated Contact with Patient 09/16/16 [phone]     (approximate)  I have reviewed the triage vital signs and the nursing notes.   HISTORY  Chief Complaint Back Pain    HPI [name] is a 82 y.o. male who is generally healthy for his age who presents for evaluation of about 2 weeks of lower back pain that is worse on the right side.  It is nonradiating.  It is worse when he wakes up in the morning and his first getting out of bed and better with some ambulation.  He reports that it feels like a cramping pain and he can feel his muscles tense.  He has been trying heating pads and Tylenol and it is somewhat helpful but has not resolved issue.  It started acutely because he cares for his wife and her chronic medical issues and during one time he was lifting to roll her he felt that injured his lower back.  It does not radiate down the back of his leg.  He denies any difficulties going to the bathroom including urinary retention and urinary incontinence.  He has no numbness or tingling in any of his extremities.  He describes the symptoms as severe when muscles are actively cramping but mild otherwise.   Past Medical History:  Diagnosis Date  . Allergic rhinitis   . BPH (benign prostatic hypertrophy)   . Degenerative arthritis   . Diabetes mellitus ([place])   . GERD (gastroesophageal reflux disease)   . HTN (hypertension)   . Hypercholesterolemia   . HYPERTENSION, BENIGN 08/15/2010   Qualifier: Diagnosis of  By: Rockey Situ MD, Tim    . Iron deficiency anemia   . Skin cancer   . TIA (transient ischemic attack) 09/13/2015  . Urinary outflow obstruction    mild    Patient Active Problem List   Diagnosis Date Noted  . BPH (benign prostatic hyperplasia) 07/01/2016  . Urinary frequency 01/08/2016  . Myasthenia gravis ([place]) 10/09/2015  . Other symptoms and  signs involving the musculoskeletal system 09/26/2015  . Binocular vision disorder with diplopia 09/26/2015  . Fatigue 09/26/2015  . Upper extremity weakness 09/20/2015  . TIA (transient ischemic attack) 09/13/2015  . CVA (cerebral infarction) 09/13/2015  . Sweating 07/20/2015  . Cold feet 07/20/2015  . Health care maintenance 01/25/2015  . B12 deficiency 11/16/2014  . Obesity (BMI 30-39.9) 09/27/2014  . Anemia 09/27/2014  . Obesity (BMI 30.0-34.9) 05/29/2014  . Hyponatremia 09/20/2013  . Diabetes mellitus ([place]) 12/06/2011  . Hypercholesterolemia 08/15/2010  . HYPERTENSION, BENIGN 08/15/2010  . PALPITATIONS, OCCASIONAL 08/15/2010    Past Surgical History:  Procedure Laterality Date  . PILONIDAL CYST EXCISION  1951   removal  . [place]  . SKIN CANCER EXCISION     multiple  . SQUAMOUS CELL CARCINOMA EXCISION     behind head  . TONSILLECTOMY AND ADENOIDECTOMY  1938    Prior to Admission medications   Medication Sig Start Date End Date Taking? Authorizing Provider  ACCU-CHEK SOFTCLIX LANCETS lancets Use as instructed 10/10/15   Einar Pheasant, MD  aspirin (ASPIR-LOW) 81 MG EC tablet Take 81 mg by mouth daily.      Historical Provider, MD  calcium-vitamin D (OSCAL 500/200 D-3) 500-200 MG-UNIT per tablet Take 1 tablet by mouth 2 (two) times daily.     Historical Provider, MD  cyanocobalamin (,VITAMIN B-12,) 1000 MCG/ML injection Inject 1,000  mcg into the muscle every 30 (thirty) days.    Historical Provider, MD  docusate sodium (COLACE) 100 MG capsule Take 100 mg by mouth 2 (two) times daily. Reported on 05/18/2016    Historical Provider, MD  finasteride (PROSCAR) 5 MG tablet Take 1 tablet (5 mg total) by mouth daily. 05/18/16   Nickie Retort, MD  glucose blood test strip Use once daily to check blood sugars Dx 250.00 Accu-check Compact plus strips 11/08/15   Einar Pheasant, MD  lidocaine (LIDODERM) 5 % Place 1 patch onto the skin every 12 (twelve) hours. Remove  & Discard patch within 12 hours.  Give yourself about 12 hours between patches. 09/16/16 09/16/17  Hinda Kehr, MD  lovastatin (MEVACOR) 20 MG tablet TAKE ONE TABLET AT BEDTIME 02/27/16   Einar Pheasant, MD  mycophenolate (CELLCEPT) 500 MG tablet Take by mouth. 08/16/16 11/14/16  Historical Provider, MD  predniSONE (DELTASONE) 10 MG tablet Take 10 mg by mouth daily with breakfast. Reported on 03/22/2016    Historical Provider, MD  pyridostigmine (MESTINON) 60 MG tablet Take 1/2 tablet tid Patient taking differently: Take 60 mg by mouth 3 (three) times daily. Take 1/2 tablet tid 09/19/15   Einar Pheasant, MD  tamsulosin (FLOMAX) 0.4 MG CAPS capsule Take 1 capsule (0.4 mg total) by mouth 2 (two) times daily. 05/18/16   Nickie Retort, MD  valsartan-hydrochlorothiazide (DIOVAN-HCT) 320-12.5 MG tablet TAKE ONE (1) TABLET EACH DAY 06/12/16   Einar Pheasant, MD    Allergies Patient has no known allergies.  Family History  Problem Relation Age of Onset  . Heart disease Father     heart atack  . Alzheimer's disease Mother   . CVA Brother     Social History Social History  Substance Use Topics  . Smoking status: Never Smoker  . Smokeless tobacco: Never Used  . Alcohol use No    Review of Systems Constitutional: No fever/chills Eyes: No visual changes. ENT: No sore throat. Cardiovascular: Denies chest pain. Respiratory: Denies shortness of breath. Gastrointestinal: No abdominal pain.  No nausea, no vomiting.  No diarrhea.  No constipation. Genitourinary: Negative for dysuria. Musculoskeletal: lower right sided back muscle pain/cramping Skin: Negative for rash. Neurological: Negative for headaches, focal weakness or numbness.  10-point ROS otherwise negative.  ____________________________________________   PHYSICAL EXAM:  VITAL SIGNS: ED Triage Vitals   Enc Vitals Group     BP (!) 173/55     Pulse Rate 73     Resp 18     Temp 97.5 F (36.4 C)     Temp Source Oral     SpO2  99 %     Weight 220 lb (99.8 kg)     Height 5\' 10"  (1.778 m)     Head Circumference      Peak Flow      Pain Score 8     Pain Loc      Pain Edu?      Excl. in [place]?     Constitutional: Alert and oriented. Well appearing and in no acute distress.  Appears younger than chronological age. Eyes: Conjunctivae are normal. PERRL. EOMI. Head: Atraumatic. Nose: No congestion/rhinnorhea. Mouth/Throat: Mucous membranes are moist.  Oropharynx non-erythematous. Neck: No stridor.  No meningeal signs.   Cardiovascular: Normal rate, regular rhythm. Good peripheral circulation. Grossly normal heart sounds. Respiratory: Normal respiratory effort.  No retractions. Lungs CTAB. Gastrointestinal: Soft and nontender. No distention.  Musculoskeletal: He has no reproducible tenderness to palpation of the muscles of  his lower back at this time.  He states that the pain was bad when he first got out of bed this morning but since he has been walking around it is gotten better.  He has no spinal tenderness to palpation and no fluctuance or induration to suggest an infectious process.   Neurologic:  Normal speech and language. No gross focal neurologic deficits are appreciated.  Skin:  Skin is warm, dry and intact. No rash noted. Psychiatric: Mood and affect are normal. Speech and behavior are normal.  ____________________________________________   LABS (all labs ordered are listed, but only abnormal results are displayed)  Labs Reviewed - No data to display ____________________________________________  EKG  None - EKG not ordered by ED physician ____________________________________________  RADIOLOGY   No results found.  ____________________________________________   PROCEDURES  Procedure(s) performed:   Procedures   Critical Care performed: No ____________________________________________   INITIAL IMPRESSION / ASSESSMENT AND PLAN / ED COURSE  Pertinent labs & imaging results that were  available during my care of the patient were reviewed by me and considered in my medical decision making (see chart for details).  Patient is quite well-appearing for his age and the description of his symptoms is consistent with a musculoskeletal strain and intermittent cramping.  Given his age I do not want to try benzodiazepine.  I think he will get relief with a Lidoderm patch we discussed using the patch in the late afternoon or early evening so that it would be active through the night and in the early morning during the time that it seems to be the worst.  When he is not using the patch she will continue to use heating pads.  He will also continue to take a gram of Tylenol every 6 hours and use ibuprofen according to label instructions.  Gave him my usual customary return precautions.  He has no evidence of an acute or emergent neurological compromise at this time and does not need any imaging given that the pain is very clearly coming from the soft tissues of the lower back.  He and his son agree with the plan.    ____________________________________________  FINAL CLINICAL IMPRESSION(S) / ED DIAGNOSES  Final diagnoses:  Chronic right-sided low back pain without sciatica     MEDICATIONS GIVEN DURING THIS VISIT:  Medications - No data to display   NEW OUTPATIENT MEDICATIONS STARTED DURING THIS VISIT:  Discharge Medication List as of 09/16/2016  6:18 AM    START taking these medications   Details  lidocaine (LIDODERM) 5 % Place 1 patch onto the skin every 12 (twelve) hours. Remove & Discard patch within 12 hours.  Give yourself about 12 hours between patches., Starting Sun 09/16/2016, Until Mon 09/16/2017, Print        Discharge Medication List as of 09/16/2016  6:18 AM      Discharge Medication List as of 09/16/2016  6:18 AM       Note:  This document was prepared using Dragon voice recognition software and may include unintentional dictation errors.    Hinda Kehr, MD 09/16/16 [phone]

## 2016-09-17 ENCOUNTER — Telehealth: Payer: Self-pay | Admitting: *Deleted

## 2016-09-17 NOTE — Telephone Encounter (Signed)
Patient was checking on prior authorization for his lidocaine Pharmacy [facility]

## 2016-09-17 NOTE — Telephone Encounter (Signed)
Please advise 

## 2016-09-18 ENCOUNTER — Telehealth: Payer: Self-pay | Admitting: Internal Medicine

## 2016-09-18 NOTE — Telephone Encounter (Signed)
Stephanie from [employer] called and stated that the request for lidocaine (LIDODERM) 5 % was denied and it was the diagnosis given was for low back pain and pt stated that it was for muscle spasms. If you want to appeal the number is [phone]. Thank you!

## 2016-09-18 NOTE — Telephone Encounter (Signed)
Per the ED notes as they prescribed it, it was prescribed for his low back pain, Please advise if you would like it appealed? thanks

## 2016-09-18 NOTE — Telephone Encounter (Signed)
If he needs this, yes appeal.

## 2016-09-18 NOTE — Telephone Encounter (Signed)
PA completed on Covermymeds. thanks 

## 2016-09-18 NOTE — Telephone Encounter (Signed)
Please advise 

## 2016-09-19 NOTE — Telephone Encounter (Signed)
Left a VM at the Pharmacy to have them call to start the appeal process, they were closed due to a departmental function.  Thanks

## 2016-09-21 NOTE — Telephone Encounter (Signed)
I do not know of another diagnosis except for persistent low back pain.  Also can note, do not recommend antiinflammatories and muscle relaxers given age and other medical issues.  If they decline, please notify pt.  His daughter-n-law reports they are helping and he will pay if has to.

## 2016-09-23 ENCOUNTER — Encounter: Payer: Self-pay | Admitting: Internal Medicine

## 2016-09-24 NOTE — Progress Notes (Signed)
Letter for appeal on Lidocaine patches.

## 2016-09-24 NOTE — Telephone Encounter (Signed)
Spoke with the pharmacy, drafting a letter for an appeal for the lidocaine patches based on the ED note and per PCP.  Will fax along with Name, DOB, and rationale/additional information to the [phone] main line or the expedited [phone].   Ordered for Chronic Right sided low back pain without sciatica.  M54.5 [employer] Medicare [id number]

## 2016-09-24 NOTE — Telephone Encounter (Signed)
He will need to be seen to determine if physical therapy is the best next step.

## 2016-09-25 ENCOUNTER — Telehealth: Payer: Self-pay | Admitting: Internal Medicine

## 2016-09-25 NOTE — Telephone Encounter (Signed)
Thanks

## 2016-09-25 NOTE — Telephone Encounter (Signed)
Faxed appeal today. Will follow, thanks

## 2016-09-25 NOTE — Telephone Encounter (Signed)
Can he be worked into an acute spot on someone's schedule since I am full on my 1/2 day tomorrow.  Thanks

## 2016-09-25 NOTE — Telephone Encounter (Signed)
[name] from [employer] called in regards to an appeal that was put. They received the appeal for the lidocaine patches, it has a 7 day turn around time and any additional info can be faxed to [phone]. Thank you!

## 2016-09-25 NOTE — Telephone Encounter (Signed)
Noted, thanks  FYI I will let you know when I get a response.

## 2016-09-26 ENCOUNTER — Telehealth: Payer: Self-pay | Admitting: Internal Medicine

## 2016-09-26 NOTE — Telephone Encounter (Signed)
pt called returning your call. Thank you!  Call pt @ [phone]

## 2016-09-26 NOTE — Telephone Encounter (Signed)
Spoke with patient, scheduled for follow up with [facility] for PT evaluation.   Thanks

## 2016-10-01 ENCOUNTER — Ambulatory Visit: Payer: Medicare Other

## 2016-10-01 ENCOUNTER — Telehealth: Payer: Self-pay | Admitting: *Deleted

## 2016-10-01 NOTE — Telephone Encounter (Signed)
Per note, it looks like Lidocaine patches appeal was denied still, I will wait to see what the rationale is on the forms when we receive them. thanks

## 2016-10-01 NOTE — Telephone Encounter (Signed)
[employer] stated that the lidocaine patches were denied, a letter will be mailed to patient and provider.   Contact Ken from [place] :[phone]

## 2016-10-01 NOTE — Telephone Encounter (Signed)
Noted! Thank you

## 2016-10-02 DIAGNOSIS — M9905 Segmental and somatic dysfunction of pelvic region: Secondary | ICD-10-CM | POA: Diagnosis not present

## 2016-10-02 DIAGNOSIS — M9903 Segmental and somatic dysfunction of lumbar region: Secondary | ICD-10-CM | POA: Diagnosis not present

## 2016-10-02 DIAGNOSIS — M955 Acquired deformity of pelvis: Secondary | ICD-10-CM | POA: Diagnosis not present

## 2016-10-02 DIAGNOSIS — M5136 Other intervertebral disc degeneration, lumbar region: Secondary | ICD-10-CM | POA: Diagnosis not present

## 2016-10-03 ENCOUNTER — Encounter: Payer: Self-pay | Admitting: Family

## 2016-10-03 ENCOUNTER — Ambulatory Visit (INDEPENDENT_AMBULATORY_CARE_PROVIDER_SITE_OTHER): Payer: Medicare Other | Admitting: Family

## 2016-10-03 VITALS — BP 145/75 | HR 66 | Temp 98.1°F | Ht 70.0 in | Wt 228.8 lb

## 2016-10-03 DIAGNOSIS — G8929 Other chronic pain: Secondary | ICD-10-CM

## 2016-10-03 DIAGNOSIS — M545 Low back pain, unspecified: Secondary | ICD-10-CM | POA: Insufficient documentation

## 2016-10-03 NOTE — Assessment & Plan Note (Signed)
Symptoms consistent with degenerative disc disease. I also agreed with patient physical therapy would be appropriate next step. Reassured by his improvement with conservative measures at home and the absence of red flags. Advised to limit NSAIDs as may be elevating BP and trial OTC topical capsaicin.

## 2016-10-03 NOTE — Patient Instructions (Addendum)
Pleasure meeting you  BP goal is less than 140/90.   Try capsaicin cream over the counter  Try to limit use of advil as may be raising blood pressure  Good luck with physical therapy!

## 2016-10-03 NOTE — Progress Notes (Signed)
Pre visit review using our clinic review tool, if applicable. No additional management support is needed unless otherwise documented below in the visit note. 

## 2016-10-03 NOTE — Progress Notes (Addendum)
Subjective:    Patient ID: [name], male    DOB: [birth year], 82 y.o.   MRN: [id number]  CC: [name] is a 82 y.o. male who presents today for an acute visit.    HPI: CC: right sided low back pain and would like referral to PT. Improving.   If stands for period of time, 'feels week', after rest, feels better.  No falls, numbness, tingling in right leg.  Went to ER for right low back pain and 'pulled muscle' when helping bedridden wife in bed; was given lidocaine patches, heat pad, Advil which has been working. Granddaughter is nurse and recommended PT.   H/o mysthenia gravis.         HISTORY:  Past Medical History:  Diagnosis Date  . Allergic rhinitis   . BPH (benign prostatic hypertrophy)   . Degenerative arthritis   . Diabetes mellitus ([place])   . GERD (gastroesophageal reflux disease)   . HTN (hypertension)   . Hypercholesterolemia   . HYPERTENSION, BENIGN 08/15/2010   Qualifier: Diagnosis of  By: Rockey Situ MD, Tim    . Iron deficiency anemia   . Skin cancer   . TIA (transient ischemic attack) 09/13/2015  . Urinary outflow obstruction    mild   Past Surgical History:  Procedure Laterality Date  . PILONIDAL CYST EXCISION  1951   removal  . [place]  . SKIN CANCER EXCISION     multiple  . SQUAMOUS CELL CARCINOMA EXCISION     behind head  . TONSILLECTOMY AND ADENOIDECTOMY  1938   Family History  Problem Relation Age of Onset  . Heart disease Father     heart atack  . Alzheimer's disease Mother   . CVA Brother     Allergies: Patient has no known allergies. Current Outpatient Prescriptions on File Prior to Visit  Medication Sig Dispense Refill  . ACCU-CHEK SOFTCLIX LANCETS lancets Use as instructed 100 each 12  . aspirin (ASPIR-LOW) 81 MG EC tablet Take 81 mg by mouth daily.      . calcium-vitamin D (OSCAL 500/200 D-3) 500-200 MG-UNIT per tablet Take 1 tablet by mouth 2 (two) times daily.     . cyanocobalamin (,VITAMIN  B-12,) 1000 MCG/ML injection Inject 1,000 mcg into the muscle every 30 (thirty) days.    Marland Kitchen docusate sodium (COLACE) 100 MG capsule Take 100 mg by mouth 2 (two) times daily. Reported on 05/18/2016    . finasteride (PROSCAR) 5 MG tablet Take 1 tablet (5 mg total) by mouth daily. 30 tablet 11  . glucose blood test strip Use once daily to check blood sugars Dx 250.00 Accu-check Compact plus strips 100 each 3  . lidocaine (LIDODERM) 5 % Place 1 patch onto the skin every 12 (twelve) hours. Remove & Discard patch within 12 hours.  Give yourself about 12 hours between patches. 10 patch 1  . lovastatin (MEVACOR) 20 MG tablet TAKE ONE TABLET AT BEDTIME 90 tablet 3  . mycophenolate (CELLCEPT) 500 MG tablet Take by mouth.    . predniSONE (DELTASONE) 10 MG tablet Take 10 mg by mouth daily with breakfast. Reported on 03/22/2016    . pyridostigmine (MESTINON) 60 MG tablet Take 1/2 tablet tid (Patient taking differently: Take 60 mg by mouth 3 (three) times daily. Take 1/2 tablet tid) 45 tablet 1  . tamsulosin (FLOMAX) 0.4 MG CAPS capsule Take 1 capsule (0.4 mg total) by mouth 2 (two) times daily. 60 capsule 11  . valsartan-hydrochlorothiazide (  DIOVAN-HCT) 320-12.5 MG tablet TAKE ONE (1) TABLET EACH DAY 90 tablet 1   No current facility-administered medications on file prior to visit.     Social History  Substance Use Topics  . Smoking status: Never Smoker  . Smokeless tobacco: Never Used  . Alcohol use No    Review of Systems  Constitutional: Negative for chills and fever.  Respiratory: Negative for cough.   Cardiovascular: Negative for chest pain and palpitations.  Gastrointestinal: Negative for nausea and vomiting.  Genitourinary: Positive for dysuria.      Objective:    BP (!) 152/70   Pulse 66   Temp 98.1 F (36.7 C) (Oral)   Ht 5\' 10"  (1.778 m)   Wt 228 lb 12.8 oz (103.8 kg)   SpO2 97%   BMI 32.83 kg/m    Physical Exam  Constitutional: He appears well-developed and well-nourished.    Cardiovascular: Regular rhythm and normal heart sounds.   Pulmonary/Chest: Effort normal and breath sounds normal. No respiratory distress. He has no wheezes. He has no rhonchi. He has no rales.  Musculoskeletal:       Lumbar back: He exhibits normal range of motion, no tenderness, no swelling, no pain and no spasm.  Full range of motion with flexion, extension, lateral side bends. No pain, numbness, tingling elicited with single leg raise bilaterally. No rash.  Neurological: He is alert. He has normal strength.  Reflex Scores:      Patellar reflexes are 2+ on the right side and 2+ on the left side. Strength and sensation normal, intact BLE.  Skin: Skin is warm and dry.  Psychiatric: He has a normal mood and affect. His speech is normal and behavior is normal.  Vitals reviewed.      Assessment & Plan:   Problem List Items Addressed This Visit      Other   Chronic right-sided low back pain without sciatica - Primary    Symptoms consistent with degenerative disc disease. I also agreed with patient physical therapy would be appropriate next step. Reassured by his improvement with conservative measures at home and the absence of red flags. Advised to limit NSAIDs as may be elevating BP and trial OTC topical capsaicin.       Relevant Orders   Ambulatory referral to Physical Therapy        I am having Mr. [name] maintain his aspirin, calcium-vitamin D, docusate sodium, cyanocobalamin, pyridostigmine, ACCU-CHEK SOFTCLIX LANCETS, glucose blood, predniSONE, lovastatin, tamsulosin, finasteride, valsartan-hydrochlorothiazide, mycophenolate, and lidocaine.   No orders of the defined types were placed in this encounter.   Return precautions given.   Risks, benefits, and alternatives of the medications and treatment plan prescribed today were discussed, and patient expressed understanding.   Education regarding symptom management and diagnosis given to patient on AVS.  Continue to  follow with Einar Pheasant, MD for routine health maintenance.   [name] and I agreed with plan.   Mable Paris, FNP

## 2016-10-05 NOTE — Telephone Encounter (Signed)
Please notify pt it was denied.  My understanding, he is paying for the medication.

## 2016-10-05 NOTE — Telephone Encounter (Signed)
Sent a [employer] thanks

## 2016-10-05 NOTE — Telephone Encounter (Signed)
Per the results, it was denied as it is not being prescribed for a FDA labeled or medically accepted use under medicare Part D. Please advise?

## 2016-10-11 DIAGNOSIS — L570 Actinic keratosis: Secondary | ICD-10-CM | POA: Diagnosis not present

## 2016-10-11 DIAGNOSIS — Z85828 Personal history of other malignant neoplasm of skin: Secondary | ICD-10-CM | POA: Diagnosis not present

## 2016-10-15 DIAGNOSIS — M9905 Segmental and somatic dysfunction of pelvic region: Secondary | ICD-10-CM | POA: Diagnosis not present

## 2016-10-15 DIAGNOSIS — M5136 Other intervertebral disc degeneration, lumbar region: Secondary | ICD-10-CM | POA: Diagnosis not present

## 2016-10-15 DIAGNOSIS — M955 Acquired deformity of pelvis: Secondary | ICD-10-CM | POA: Diagnosis not present

## 2016-10-15 DIAGNOSIS — M9903 Segmental and somatic dysfunction of lumbar region: Secondary | ICD-10-CM | POA: Diagnosis not present

## 2016-10-23 ENCOUNTER — Encounter: Payer: Self-pay | Admitting: Internal Medicine

## 2016-10-23 ENCOUNTER — Ambulatory Visit (INDEPENDENT_AMBULATORY_CARE_PROVIDER_SITE_OTHER): Payer: Medicare Other | Admitting: Internal Medicine

## 2016-10-23 DIAGNOSIS — E7800 Pure hypercholesterolemia, unspecified: Secondary | ICD-10-CM

## 2016-10-23 DIAGNOSIS — I10 Essential (primary) hypertension: Secondary | ICD-10-CM

## 2016-10-23 DIAGNOSIS — G7000 Myasthenia gravis without (acute) exacerbation: Secondary | ICD-10-CM

## 2016-10-23 DIAGNOSIS — E669 Obesity, unspecified: Secondary | ICD-10-CM | POA: Diagnosis not present

## 2016-10-23 DIAGNOSIS — G8929 Other chronic pain: Secondary | ICD-10-CM

## 2016-10-23 DIAGNOSIS — M545 Low back pain, unspecified: Secondary | ICD-10-CM

## 2016-10-23 DIAGNOSIS — E119 Type 2 diabetes mellitus without complications: Secondary | ICD-10-CM

## 2016-10-23 NOTE — Progress Notes (Signed)
Pre visit review using our clinic review tool, if applicable. No additional management support is needed unless otherwise documented below in the visit note. 

## 2016-10-23 NOTE — Progress Notes (Signed)
Patient ID: [name], male   DOB: July 27, 1926, 82 y.o.   MRN: [id number]   Subjective:    Patient ID: [name], male    DOB: 30-Jan-1926, 82 y.o.   MRN: [id number]  HPI  Patient here for a scheduled follow up.  He is seeing Dr Earle Gell for his myasthenia gravis.  His neuro exam is stable.  Continues on cellcept and mestinon.  Taking prednisone daily.  Gradually tapering.  Eating well.  No nausea or vomiting.  No problems with swallowing or chewing.  Has been having some mid to low back pain.  Hurts to get up after sitting for a while.  Once up and moving - better.  Plans to start PT next week.  Blood sugars doing better.  Blood sugars averaging 120-130s.  No abdominal pain.  Bowels stable.  Increased stress with caring for his wife.  Hospice is now.  Helping.     Past Medical History:  Diagnosis Date  . Allergic rhinitis   . BPH (benign prostatic hypertrophy)   . Degenerative arthritis   . Diabetes mellitus ([place])   . GERD (gastroesophageal reflux disease)   . HTN (hypertension)   . Hypercholesterolemia   . HYPERTENSION, BENIGN 08/15/2010   Qualifier: Diagnosis of  By: Rockey Situ MD, Tim    . Iron deficiency anemia   . Skin cancer   . TIA (transient ischemic attack) 09/13/2015  . Urinary outflow obstruction    mild   Past Surgical History:  Procedure Laterality Date  . PILONIDAL CYST EXCISION  1951   removal  . [place]  . SKIN CANCER EXCISION     multiple  . SQUAMOUS CELL CARCINOMA EXCISION     behind head  . TONSILLECTOMY AND ADENOIDECTOMY  1938   Family History  Problem Relation Age of Onset  . Heart disease Father     heart atack  . Alzheimer's disease Mother   . CVA Brother    Social History   Social History  . Marital status: Married    Spouse name: N/A  . Number of children: N/A  . Years of education: N/A   Social History Main Topics  . Smoking status: Never Smoker  . Smokeless tobacco: Never Used  . Alcohol use No  . Drug  use: No  . Sexual activity: Not Asked   Other Topics Concern  . None   Social History Narrative   Retired, married. Walks everyday          Outpatient Encounter Prescriptions as of 10/23/2016  Medication Sig  . ACCU-CHEK SOFTCLIX LANCETS lancets Use as instructed  . aspirin (ASPIR-LOW) 81 MG EC tablet Take 81 mg by mouth daily.    . calcium-vitamin D (OSCAL 500/200 D-3) 500-200 MG-UNIT per tablet Take 1 tablet by mouth 2 (two) times daily.   . cyanocobalamin (,VITAMIN B-12,) 1000 MCG/ML injection Inject 1,000 mcg into the muscle every 30 (thirty) days.  Marland Kitchen docusate sodium (COLACE) 100 MG capsule Take 100 mg by mouth 2 (two) times daily. Reported on 05/18/2016  . finasteride (PROSCAR) 5 MG tablet Take 1 tablet (5 mg total) by mouth daily.  Marland Kitchen glucose blood test strip Use once daily to check blood sugars Dx 250.00 Accu-check Compact plus strips  . lovastatin (MEVACOR) 20 MG tablet TAKE ONE TABLET AT BEDTIME  . mycophenolate (CELLCEPT) 500 MG tablet Take by mouth.  . pyridostigmine (MESTINON) 60 MG tablet Take 1/2 tablet tid (Patient taking differently: Take 60 mg by  mouth 3 (three) times daily. Take 1/2 tablet tid)  . tamsulosin (FLOMAX) 0.4 MG CAPS capsule Take 1 capsule (0.4 mg total) by mouth 2 (two) times daily.  . valsartan-hydrochlorothiazide (DIOVAN-HCT) 320-12.5 MG tablet TAKE ONE (1) TABLET EACH DAY  . [DISCONTINUED] predniSONE (DELTASONE) 10 MG tablet Take 10 mg by mouth daily with breakfast. Reported on 03/22/2016  . lidocaine (LIDODERM) 5 % Place 1 patch onto the skin every 12 (twelve) hours. Remove & Discard patch within 12 hours.  Give yourself about 12 hours between patches. (Patient not taking: Reported on 10/23/2016)   No facility-administered encounter medications on file as of 10/23/2016.     Review of Systems  Constitutional: Negative for appetite change and unexpected weight change.  HENT: Negative for congestion and sinus pressure.   Respiratory: Negative for  cough, chest tightness and shortness of breath.   Cardiovascular: Negative for chest pain, palpitations and leg swelling.  Gastrointestinal: Negative for abdominal pain, diarrhea, nausea and vomiting.  Genitourinary: Negative for difficulty urinating and dysuria.  Musculoskeletal: Positive for back pain. Negative for joint swelling.  Skin: Negative for color change and rash.  Neurological: Negative for dizziness, light-headedness and headaches.  Psychiatric/Behavioral: Negative for agitation and dysphoric mood.       Objective:    Physical Exam  Constitutional: He appears well-developed and well-nourished. No distress.  HENT:  Nose: Nose normal.  Mouth/Throat: Oropharynx is clear and moist.  Neck: Neck supple. No thyromegaly present.  Cardiovascular: Normal rate and regular rhythm.   Pulmonary/Chest: Effort normal and breath sounds normal. No respiratory distress.  Abdominal: Soft. Bowel sounds are normal. There is no tenderness.  Musculoskeletal: He exhibits no edema or tenderness.  Lymphadenopathy:    He has no cervical adenopathy.  Skin: No rash noted. No erythema.  Psychiatric: He has a normal mood and affect. His behavior is normal.    BP (!) 154/66   Pulse 74   Temp 98.4 F (36.9 C) (Oral)   Ht '5\' 10"'  (1.778 m)   Wt 227 lb 3.2 oz (103.1 kg)   SpO2 95%   BMI 32.60 kg/m  Wt Readings from Last 3 Encounters:  10/23/16 227 lb 3.2 oz (103.1 kg)  10/03/16 228 lb 12.8 oz (103.8 kg)  09/16/16 220 lb (99.8 kg)     Lab Results  Component Value Date   WBC 10.2 08/29/2016   HGB 13.5 08/29/2016   HCT 41 08/29/2016   PLT 260 08/29/2016   GLUCOSE 116 (H) 07/24/2016   CHOL 134 07/24/2016   TRIG 80.0 07/24/2016   HDL 59.20 07/24/2016   LDLCALC 59 07/24/2016   ALT 17 07/24/2016   AST 17 07/24/2016   NA 137 07/24/2016   K 4.0 07/24/2016   CL 101 07/24/2016   CREATININE 0.77 07/24/2016   BUN 17 07/24/2016   CO2 31 07/24/2016   TSH 1.72 03/23/2016   PSA 0.52  07/07/2015   HGBA1C 7.8 (H) 07/24/2016   MICROALBUR 1.4 03/23/2016       Assessment & Plan:   Problem List Items Addressed This Visit    Chronic right-sided low back pain without sciatica    Mid to low back pain as outlined.  Worse when goes to stand - after sitting for a while.  Planning to start PT next week.  Follow.        Diabetes mellitus ([place])    Sugars doing better as outlined.  Tapering prednisone.  Follow met b and a1c.  Low carb diet.  Relevant Orders   Hemoglobin H9V   Basic metabolic panel   Hypercholesterolemia    On lovastatin.  Low cholesterol diet and exercise.  Follow lipid panel and liver function tests.        Relevant Orders   Hepatic function panel   Lipid panel   HYPERTENSION, BENIGN    Blood pressure has been under good control.  Recheck improved.  Same medication regimen.  Follow pressures.  Follow metabolic panel.        Relevant Orders   CBC with Differential/Platelet   Myasthenia gravis ([place])    Followed by Dr Earle Gell.  Stable on mestinon and cellcept.  Follow.  Gradually tapering prednisone.        Obesity (BMI 30.0-34.9)    Diet and exercise.  Follow.           Einar Pheasant, MD

## 2016-10-24 ENCOUNTER — Telehealth: Payer: Self-pay | Admitting: Internal Medicine

## 2016-10-24 NOTE — Telephone Encounter (Signed)
Pt declined the AWV for now. Thank you!

## 2016-10-25 ENCOUNTER — Ambulatory Visit: Payer: Medicare Other

## 2016-11-01 ENCOUNTER — Ambulatory Visit: Payer: Medicare Other | Attending: Family

## 2016-11-01 ENCOUNTER — Encounter: Payer: Self-pay | Admitting: Internal Medicine

## 2016-11-01 DIAGNOSIS — M545 Low back pain, unspecified: Secondary | ICD-10-CM

## 2016-11-01 DIAGNOSIS — M6281 Muscle weakness (generalized): Secondary | ICD-10-CM

## 2016-11-01 NOTE — Patient Instructions (Signed)
Knee to Chest (Flexion)    Pull knee toward chest. Feel stretch in lower back or buttock area. Breathing deeply, Hold _30-45___ seconds. Repeat with other knee. Repeat __3__ times on each side. Do _3___ sessions per day.   Lower Trunk Rotation Stretch    Keeping back flat and feet together, rotate knees to left side. Hold __30__ seconds. Repeat to the other side. Repeat _3___ times per session to each side. Do _3___ sessions per day.

## 2016-11-01 NOTE — Assessment & Plan Note (Signed)
Mid to low back pain as outlined.  Worse when goes to stand - after sitting for a while.  Planning to start PT next week.  Follow.

## 2016-11-01 NOTE — Assessment & Plan Note (Signed)
Blood pressure has been under good control.  Recheck improved.  Same medication regimen.  Follow pressures.  Follow metabolic panel.

## 2016-11-01 NOTE — Assessment & Plan Note (Signed)
Sugars doing better as outlined.  Tapering prednisone.  Follow met b and a1c.  Low carb diet.   

## 2016-11-01 NOTE — Assessment & Plan Note (Signed)
Followed by Dr Earle Gell.  Stable on mestinon and cellcept.  Follow.  Gradually tapering prednisone.

## 2016-11-01 NOTE — Assessment & Plan Note (Signed)
On lovastatin.  Low cholesterol diet and exercise.  Follow lipid panel and liver function tests.   

## 2016-11-01 NOTE — Assessment & Plan Note (Signed)
Diet and exercise.  Follow.  

## 2016-11-01 NOTE — Therapy (Signed)
[place] MAIN [facility] SERVICES [address] [place], [place], [address] Phone: [phone]   Fax:  [phone]  Physical Therapy Evaluation  Patient Details  Name: [name] MRN: [id number] Date of Birth: 1926/02/28 Referring Provider: Mable Paris  Encounter Date: 11/01/2016      PT End of Session - 11/01/16 1011    Visit Number 1   Number of Visits 13   Date for PT Re-Evaluation 2016-12-15   Authorization Type g codes 1/10   PT Start Time 0930   PT Stop Time 1030   PT Time Calculation (min) 60 min   Activity Tolerance Patient tolerated treatment well   Behavior During Therapy [facility] for tasks assessed/performed      Past Medical History:  Diagnosis Date  . Allergic rhinitis   . BPH (benign prostatic hypertrophy)   . Degenerative arthritis   . Diabetes mellitus ([place])   . GERD (gastroesophageal reflux disease)   . HTN (hypertension)   . Hypercholesterolemia   . HYPERTENSION, BENIGN 08/15/2010   Qualifier: Diagnosis of  By: Rockey Situ MD, Tim    . Iron deficiency anemia   . Skin cancer   . TIA (transient ischemic attack) 09/13/2015  . Urinary outflow obstruction    mild    Past Surgical History:  Procedure Laterality Date  . PILONIDAL CYST EXCISION  1951   removal  . [place]  . SKIN CANCER EXCISION     multiple  . SQUAMOUS CELL CARCINOMA EXCISION     behind head  . TONSILLECTOMY AND ADENOIDECTOMY  1938    There were no vitals filed for this visit.       Subjective Assessment - 11/01/16 0939    Subjective Low back apin   Pertinent History Pt reports that he had a right low back muscle strain approximately 6 weeks ago when turning his wife in bed (wife is bedridden). Pain was initially in the right low back but then he started having some pain on the left side of his back as well. Pt reports that he used capsaicin and lidocaine patches which helped with the pain. Pain has been gradually  improving and he is only currently having soreness in his bilateral low back. Approximately 1 year ago he was diagnosed with myasthenia gravis. He went to see [place] neurology and had immunoglobulin therapy. Reports 85-90% improvement in symptoms since that time. Pt reports that he has also has a "disc problem" in his lower back with pain that runs down his L posterior thigh. He has been seeing a chiropractor for this problem for the last 4-5 years. Denies radicular symptoms related to current episode. Denies N/T or bowel/bladder changes. Pain was initially waking pt from sleep but not currently. Remote history of skin cancer. ROS negative for red flags.    Limitations Standing   How long can you stand comfortably? 15 minutes   Currently in Pain? Yes   Pain Score 5   Worst: 8/10, Best: 0/10   Pain Location Back   Pain Orientation Right;Left;Lower   Pain Descriptors / Indicators Sore   Pain Type Acute pain   Pain Radiating Towards Hx of L thigh pain from lumbar disc issue but this was pre-existing 4-5 years prior to current episode.    Pain Onset More than a month ago   Aggravating Factors  Extended standing, bending forward   Pain Relieving Factors Walking, sitting, lidoderm patches, better in AM   Multiple Pain Sites No  [facility] PT Assessment - 11/01/16 0949      Assessment   Medical Diagnosis Chronic R sided back pain   Referring Provider Mable Paris   Onset Date/Surgical Date 09/20/16  Approximate   Hand Dominance Right   Next MD Visit None scheduled   Prior Therapy Left rotator cuff rehab approximately 20 years ago. Currently sees chiropractor for "disc issue"     Precautions   Precautions None     Restrictions   Weight Bearing Restrictions No     Balance Screen   Has the patient fallen in the past 6 months No   Has the patient had a decrease in activity level because of a fear of falling?  No   Is the patient reluctant to leave their home because of a fear of  falling?  No     Home Environment   Living Environment Private residence   Living Arrangements Spouse/significant other   Type of [place] entrance   [place];Able to live on main level with bedroom/bathroom   Additional Comments Pt uses a mechanical lift to help transfer wife     Prior Function   Level of Independence Independent   Vocation Retired   [employer] Retired Optometrist   Leisure Currently no leisure activity. 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 No pain with flexion but pain when returning to neutral, no pain with extension or overpressure. Pt reports bilateral low back pain with lateral flexion including with overpressure, No pain with R rotation but pain with overpressure with left rotation. Positive [place] test on the RLE for both stretch and low back pain, stretch only when testing LLE. Loss of hip IR/ER bilaterally, more significant loss of IR, most notably severe loss of L hip IR     Strength   Overall Strength Comments UE strength grossly WFL, 4+ to 5/5 with MMT. LE strength: 4/5 bilateral hip flexion with reproduction of back soreness with lumbar bracing. Otherwise knee flexion/extension 5/5, ankle DF is 4-/5 bilateral (hx of MG possible cause?), hip abduction/adduction 4-/5 tested in sitting, hip extension 4-/5 bilaterally. Pt reports reproduction of pain with extension and rotation isometrics. No pain with isometrics for flexion or lateral flexoin      Palpation   Palpation comment Positive reproduction of pain with palpation along lumbar paraspinals and QL bilaterally. Pain with attempted assessment of CPA of lumbar spine, Pt is generally hypomobile thorughout lumbar spine     Ambulation/Gait   Gait Comments Full gait assessment deferred        TREATMENT  Manual Therapy MHP applied to lumbar spine x 5 minutes (unbilled); STM to lumbar paraspinals including muscle stripping and trigger point release; Pt educated and performed hooklying single knee to chest stretch and lumbar rotation stretches; Handout and education provided to patient.                     PT Education -  November 12, 2016 1010    Education provided Yes   Education Details HEP, plan of care   Person(s) Educated Patient   Methods Explanation;Demonstration;Handout   Comprehension Verbalized understanding;Returned demonstration             PT Long Term Goals - 12-Nov-2016 1451      PT LONG TERM GOAL #1   Title Pt will be independent with HEP in order to improve strength and balance in order to decrease fall risk and improve function at home and work.   Time 6   Period Weeks   Status New     PT LONG TERM GOAL #2   Title Pt will report worst pain as 6/10 on NPRS in order to demonstrate clinically significant reduction in pain   Baseline November 12, 2016: 8/10 worst   Time 6   Period Weeks   Status New     PT LONG TERM GOAL #3   Title Pt will report ability to help turn wife in bed without increase in low back pain   Time 6   Status New     PT LONG TERM GOAL #4   Title Pt will be able to stand for at least 30 minutes before needing to sit due to back pain   Baseline 11-12-16: 15 minutes pain increases   Time 6   Period Weeks   Status New               Plan - 12-Nov-2016 1042    Clinical Impression Statement Pt is a pleasant 82 yo male referred for R low back pain without sciatica. Pt reports low back muscle strain approximately 6 weeks ago when  turning his bedbound wife. Since that time the pain has gradually improved. At this time pt reports he is only having some soreness in his back. PT examination reveals moderate to severe loss of lumbar spine AROM in all planes. Painful active extension when returning from forward flexed position. Painful resisted isometric extension and rotation. Positive reproduction of pain with palpation along lumbar paraspinals bilaterally. Pt with decreased hip rotation and extension AROM/PROM as well as decreased hip strength in multiple planes. Pt appears to have a lumbar muscle strain and will benefit from skilled PT services to address pain and functional deficits in order to return to full function at home and when caring for wife.    Rehab Potential Good   Clinical Impairments Affecting Rehab Potential positive: improving symptoms, motivation; negative: age   PT Frequency 2x / week   PT Duration 6 weeks   PT Treatment/Interventions Aquatic Therapy;Cryotherapy;Electrical Stimulation;Iontophoresis 4mg /ml Dexamethasone;Moist Heat;Traction;Ultrasound;Gait training;Therapeutic activities;Therapeutic exercise;Balance training;Neuromuscular re-education;Patient/family education;Manual techniques;Passive range of motion;Dry needling   PT Next Visit Plan Have pt complete mODI, issue pelvic tilts, progress manual and strengthening techniques for lumbar strain   PT Home Exercise Plan Hooklying single knee to chest 30s bilateral x 3, 3 times/day; Hooklying lumbar rotation stretch 30s bilateral x 3, 3 times/day   Consulted and Agree with Plan of Care Patient      Patient will benefit from skilled therapeutic intervention in order to improve the following deficits and impairments:  Decreased strength, Pain, Decreased range of motion  Visit Diagnosis: Acute bilateral low back pain without sciatica - Plan: PT plan of care cert/re-cert  Muscle weakness (generalized) - Plan: PT plan of care cert/re-cert      G-Codes -  November 12, 2016 1454    Functional Assessment Tool Used clinical judgement, NPRS   Functional Limitation Mobility: Walking and moving  around   Mobility: Walking and Moving Around Current Status [phone]) At least 40 percent but less than 60 percent impaired, limited or restricted   Mobility: Walking and Moving Around Goal Status [phone]) At least 1 percent but less than 20 percent impaired, limited or restricted       Problem List Patient Active Problem List   Diagnosis Date Noted  . Chronic right-sided low back pain without sciatica 10/03/2016  . BPH (benign prostatic hyperplasia) 07/01/2016  . Urinary frequency 01/08/2016  . Myasthenia gravis ([place]) 10/09/2015  . Other symptoms and signs involving the musculoskeletal system 09/26/2015  . Binocular vision disorder with diplopia 09/26/2015  . Fatigue 09/26/2015  . Upper extremity weakness 09/20/2015  . TIA (transient ischemic attack) 09/13/2015  . CVA (cerebral infarction) 09/13/2015  . Sweating 07/20/2015  . Cold feet 07/20/2015  . Health care maintenance 01/25/2015  . B12 deficiency 11/16/2014  . Obesity (BMI 30-39.9) 09/27/2014  . Anemia 09/27/2014  . Obesity (BMI 30.0-34.9) 05/29/2014  . Hyponatremia 09/20/2013  . Diabetes mellitus ([place]) 12/06/2011  . Hypercholesterolemia 08/15/2010  . HYPERTENSION, BENIGN 08/15/2010  . PALPITATIONS, OCCASIONAL 08/15/2010   Phillips Grout PT, DPT   Huprich,Jason 11/01/2016, 2:58 PM  [place] MAIN [facility] SERVICES [address] [place], [place], [address] Phone: [phone]   Fax:  [phone]  Name: [name] MRN: [id number] Date of Birth: 03-Jun-1926

## 2016-11-06 ENCOUNTER — Ambulatory Visit: Payer: Medicare Other | Attending: Family | Admitting: Physical Therapy

## 2016-11-06 ENCOUNTER — Encounter: Payer: Self-pay | Admitting: Physical Therapy

## 2016-11-06 DIAGNOSIS — M545 Low back pain, unspecified: Secondary | ICD-10-CM

## 2016-11-06 DIAGNOSIS — M6281 Muscle weakness (generalized): Secondary | ICD-10-CM | POA: Diagnosis not present

## 2016-11-06 NOTE — Therapy (Signed)
[place] MAIN [facility] SERVICES [address] [place], [place], [address] Phone: [phone]   Fax:  [phone]  Physical Therapy Treatment  Patient Details  Name: [name] MRN: [id number] Date of Birth: Jan 30, 1926 Referring Provider: Mable Paris  Encounter Date: 11/06/2016      PT End of Session - 11/06/16 1000    Visit Number 2   Number of Visits 13   Date for PT Re-Evaluation 12-21-2016   Authorization Type g codes 12/23/22   PT Start Time 1000   PT Stop Time 1044   PT Time Calculation (min) 44 min   Activity Tolerance Patient tolerated treatment well   Behavior During Therapy [facility] for tasks assessed/performed      Past Medical History:  Diagnosis Date  . Allergic rhinitis   . BPH (benign prostatic hypertrophy)   . Degenerative arthritis   . Diabetes mellitus ([place])   . GERD (gastroesophageal reflux disease)   . HTN (hypertension)   . Hypercholesterolemia   . HYPERTENSION, BENIGN 08/15/2010   Qualifier: Diagnosis of  By: Rockey Situ MD, Tim    . Iron deficiency anemia   . Skin cancer   . TIA (transient ischemic attack) 09/13/2015  . Urinary outflow obstruction    mild    Past Surgical History:  Procedure Laterality Date  . PILONIDAL CYST EXCISION  1951   removal  . [place]  . SKIN CANCER EXCISION     multiple  . SQUAMOUS CELL CARCINOMA EXCISION     behind head  . TONSILLECTOMY AND ADENOIDECTOMY  1938    There were no vitals filed for this visit.      Subjective Assessment - 11/06/16 1007    Subjective Pt reports his back feels sore today.  Has been completing his HEP daily without questions or concerns.     Pertinent History Pt reports that he had a right low back muscle strain approximately 6 weeks ago when turning his wife in bed (wife is bedridden). Pain was initially in the right low back but then he started having some pain on the left side of his back as well. Pt reports that he used  capsaicin and lidocaine patches which helped with the pain. Pain has been gradually improving and he is only currently having soreness in his bilateral low back. Approximately 1 year ago he was diagnosed with myasthenia gravis. He went to see [place] neurology and had immunoglobulin therapy. Reports 85-90% improvement in symptoms since that time. Pt reports that he has also has a "disc problem" in his lower back with pain that runs down his L posterior thigh. He has been seeing a chiropractor for this problem for the last 4-5 years. Denies radicular symptoms related to current episode. Denies N/T or bowel/bladder changes. Pain was initially waking pt from sleep but not currently. Remote history of skin cancer. ROS negative for red flags.    Limitations Standing   How long can you stand comfortably? 15 minutes   Currently in Pain? Yes   Pain Score 6   "but I still go on and do what I need to do"   Pain Location Back   Pain Orientation Lower   Pain Descriptors / Indicators Sore   Pain Type Chronic pain   Pain Onset More than a month ago   Multiple Pain Sites No       TREATMENT   Pt completed modified ODI and results explained to the pt: 32%  Manual Therapy:  Pt in prone with pillow under pelvis. STM to Bil lumbar paraspinals with trigger points noted. x10 minutes. Pt reported relief following.   Therapeutic Exercise:  Hooklying single knee to chest stretch with towel assist x3 with 30 second hold each LE  Hooklying lumbar rotation stretches x2 with 30 second holds each direction  Sidelying thoracic rotation sunrise exercise x10 each side  Page BreakPosterior pelvic tilts with tactile and verbal cues for technique. 10 second holds x10  Seated theraball roll outs with 5 second holds x5 forward, left, and right               PT Education - 11/06/16 0959    Education provided Yes   Education Details Manual therapy; exercise technique; reviewed HEP   Person(s) Educated Patient    Methods Explanation;Demonstration;Verbal cues   Comprehension Verbalized understanding;Returned demonstration;Need further instruction             PT Long Term Goals - 11/01/16 1451      PT LONG TERM GOAL #1   Title Pt will be independent with HEP in order to improve strength and balance in order to decrease fall risk and improve function at home and work.   Time 6   Period Weeks   Status New     PT LONG TERM GOAL #2   Title Pt will report worst pain as 6/10 on NPRS in order to demonstrate clinically significant reduction in pain   Baseline 11/01/16: 8/10 worst   Time 6   Period Weeks   Status New     PT LONG TERM GOAL #3   Title Pt will report ability to help turn wife in bed without increase in low back pain   Time 6   Status New     PT LONG TERM GOAL #4   Title Pt will be able to stand for at least 30 minutes before needing to sit due to back pain   Baseline 11/01/16: 15 minutes pain increases   Time 6   Period Weeks   Status New               Plan - 11/06/16 1039    Clinical Impression Statement Pt responded well to [facility] and therapeutic exercise this date with reported decreased soreness at the end of the session.  He will benefit from continued skilled PT interventions for improved mobility and decreased pain.   Rehab Potential Good   Clinical Impairments Affecting Rehab Potential positive: improving symptoms, motivation; negative: age   PT Frequency 2x / week   PT Duration 6 weeks   PT Treatment/Interventions Aquatic Therapy;Cryotherapy;Electrical Stimulation;Iontophoresis 4mg /ml Dexamethasone;Moist Heat;Traction;Ultrasound;Gait training;Therapeutic activities;Therapeutic exercise;Balance training;Neuromuscular re-education;Patient/family education;Manual techniques;Passive range of motion;Dry needling   PT Next Visit Plan Have pt complete mODI, issue pelvic tilts, progress manual and strengthening techniques for lumbar strain   PT Home Exercise Plan  Hooklying single knee to chest 30s bilateral x 3, 3 times/day; Hooklying lumbar rotation stretch 30s bilateral x 3, 3 times/day   Consulted and Agree with Plan of Care Patient      Patient will benefit from skilled therapeutic intervention in order to improve the following deficits and impairments:  Decreased strength, Pain, Decreased range of motion  Visit Diagnosis: Acute bilateral low back pain without sciatica  Muscle weakness (generalized)     Problem List Patient Active Problem List   Diagnosis Date Noted  . Chronic right-sided low back pain without sciatica 10/03/2016  . BPH (benign prostatic hyperplasia)  07/01/2016  . Urinary frequency 01/08/2016  . Myasthenia gravis ([place]) 10/09/2015  . Other symptoms and signs involving the musculoskeletal system 09/26/2015  . Binocular vision disorder with diplopia 09/26/2015  . Fatigue 09/26/2015  . Upper extremity weakness 09/20/2015  . TIA (transient ischemic attack) 09/13/2015  . CVA (cerebral infarction) 09/13/2015  . Sweating 07/20/2015  . Cold feet 07/20/2015  . Health care maintenance 01/25/2015  . B12 deficiency 11/16/2014  . Obesity (BMI 30-39.9) 09/27/2014  . Anemia 09/27/2014  . Obesity (BMI 30.0-34.9) 05/29/2014  . Hyponatremia 09/20/2013  . Diabetes mellitus ([place]) 12/06/2011  . Hypercholesterolemia 08/15/2010  . HYPERTENSION, BENIGN 08/15/2010  . PALPITATIONS, OCCASIONAL 08/15/2010    Collie Siad PT, DPT 11/06/2016, 10:47 AM  [place] MAIN [facility] SERVICES [address] [place], [place], [address] Phone: [phone]   Fax:  [phone]  Name: [name] MRN: [id number] Date of Birth: 1926-04-16

## 2016-11-07 DIAGNOSIS — M9905 Segmental and somatic dysfunction of pelvic region: Secondary | ICD-10-CM | POA: Diagnosis not present

## 2016-11-07 DIAGNOSIS — M955 Acquired deformity of pelvis: Secondary | ICD-10-CM | POA: Diagnosis not present

## 2016-11-07 DIAGNOSIS — M5136 Other intervertebral disc degeneration, lumbar region: Secondary | ICD-10-CM | POA: Diagnosis not present

## 2016-11-07 DIAGNOSIS — M9903 Segmental and somatic dysfunction of lumbar region: Secondary | ICD-10-CM | POA: Diagnosis not present

## 2016-11-08 ENCOUNTER — Ambulatory Visit: Payer: Medicare Other

## 2016-11-08 ENCOUNTER — Encounter: Payer: Medicare Other | Admitting: Physical Therapy

## 2016-11-08 ENCOUNTER — Telehealth: Payer: Self-pay | Admitting: Internal Medicine

## 2016-11-08 NOTE — Telephone Encounter (Signed)
Pt will call back when he's ready to sch AWV. Thank you!

## 2016-11-12 ENCOUNTER — Encounter: Payer: Self-pay | Admitting: Physical Therapy

## 2016-11-12 ENCOUNTER — Ambulatory Visit: Payer: Medicare Other | Admitting: Physical Therapy

## 2016-11-12 DIAGNOSIS — M6281 Muscle weakness (generalized): Secondary | ICD-10-CM

## 2016-11-12 DIAGNOSIS — M545 Low back pain, unspecified: Secondary | ICD-10-CM

## 2016-11-12 NOTE — Therapy (Signed)
[place] MAIN [facility] SERVICES [address] [place], [place], [address] Phone: [phone]   Fax:  [phone]  Physical Therapy Treatment  Patient Details  Name: [name] MRN: [id number] Date of Birth: 1926-10-23 Referring Provider: Mable Paris  Encounter Date: 11/12/2016      PT End of Session - 11/12/16 1003    Visit Number 3   Number of Visits 13   Date for PT Re-Evaluation 2017-01-01   Authorization Type g codes 02-01-23   PT Start Time 1000   PT Stop Time 1045   PT Time Calculation (min) 45 min   Activity Tolerance Patient tolerated treatment well   Behavior During Therapy [facility] for tasks assessed/performed      Past Medical History:  Diagnosis Date  . Allergic rhinitis   . BPH (benign prostatic hypertrophy)   . Degenerative arthritis   . Diabetes mellitus ([place])   . GERD (gastroesophageal reflux disease)   . HTN (hypertension)   . Hypercholesterolemia   . HYPERTENSION, BENIGN 08/15/2010   Qualifier: Diagnosis of  By: Rockey Situ MD, Tim    . Iron deficiency anemia   . Skin cancer   . TIA (transient ischemic attack) 09/13/2015  . Urinary outflow obstruction    mild    Past Surgical History:  Procedure Laterality Date  . PILONIDAL CYST EXCISION  1951   removal  . [place]  . SKIN CANCER EXCISION     multiple  . SQUAMOUS CELL CARCINOMA EXCISION     behind head  . TONSILLECTOMY AND ADENOIDECTOMY  1938    There were no vitals filed for this visit.      Subjective Assessment - 11/12/16 1001    Subjective Patient reports that his pain fluxuates and it is 5 or 6/10 today.    Pertinent History Pt reports that he had a right low back muscle strain approximately 6 weeks ago when turning his wife in bed (wife is bedridden). Pain was initially in the right low back but then he started having some pain on the left side of his back as well. Pt reports that he used capsaicin and lidocaine patches which  helped with the pain. Pain has been gradually improving and he is only currently having soreness in his bilateral low back. Approximately 1 year ago he was diagnosed with myasthenia gravis. He went to see [place] neurology and had immunoglobulin therapy. Reports 85-90% improvement in symptoms since that time. Pt reports that he has also has a "disc problem" in his lower back with pain that runs down his L posterior thigh. He has been seeing a chiropractor for this problem for the last 4-5 years. Denies radicular symptoms related to current episode. Denies N/T or bowel/bladder changes. Pain was initially waking pt from sleep but not currently. Remote history of skin cancer. ROS negative for red flags.    Limitations Standing   How long can you stand comfortably? 15 minutes   Currently in Pain? Yes   Pain Score 6    Pain Location Back   Pain Descriptors / Indicators Sore   Pain Onset More than a month ago   Multiple Pain Sites No      Manual Therapy:  Pt in prone with pillow under pelvis. STM to  with trigger points Left  Posterior SI and right quadratus muscle  X 10 minutes. Pt reported relief following.   Therapeutic Exercise:  Hooklying single knee to chest stretch with towel assist x3  with 30 second hold each LE  Hooklying lumbar rotation stretches x5 with 30 second holds each direction  hooklying marching with TA x 20 Hooklying hip abd/ER with TA x 20  Quadratus stretch right x 30 x 3 Pt reported relief following manual therapy from 6/10 to 4 /10 to low back and 8/10 quadratus pain prior and  7/10 following manual therapy                               PT Education - 11/12/16 1002    Education provided Yes   Education Details use of ice and heat   Person(s) Educated Patient   Methods Explanation   Comprehension Verbalized understanding             PT Long Term Goals - 11/01/16 1451      PT LONG TERM GOAL #1   Title Pt will be independent with HEP in  order to improve strength and balance in order to decrease fall risk and improve function at home and work.   Time 6   Period Weeks   Status New     PT LONG TERM GOAL #2   Title Pt will report worst pain as 6/10 on NPRS in order to demonstrate clinically significant reduction in pain   Baseline 11/01/16: 8/10 worst   Time 6   Period Weeks   Status New     PT LONG TERM GOAL #3   Title Pt will report ability to help turn wife in bed without increase in low back pain   Time 6   Status New     PT LONG TERM GOAL #4   Title Pt will be able to stand for at least 30 minutes before needing to sit due to back pain   Baseline 11/01/16: 15 minutes pain increases   Time 6   Period Weeks   Status New               Plan - 11/12/16 1003    Clinical Impression Statement Patient continues to have LBP that is constant but the range of intensity changes during the day. He has a spasm with mobility sit to stand and says that the muscle tightens up when he stands or walks. Patiient performs stretching and beginning core strengthening exercises and responds to STM and manual therapy.   Rehab Potential Good   Clinical Impairments Affecting Rehab Potential positive: improving symptoms, motivation; negative: age   PT Frequency 2x / week   PT Duration 6 weeks   PT Treatment/Interventions Aquatic Therapy;Cryotherapy;Electrical Stimulation;Iontophoresis 4mg /ml Dexamethasone;Moist Heat;Traction;Ultrasound;Gait training;Therapeutic activities;Therapeutic exercise;Balance training;Neuromuscular re-education;Patient/family education;Manual techniques;Passive range of motion;Dry needling   PT Next Visit Plan Have pt complete mODI, issue pelvic tilts, progress manual and strengthening techniques for lumbar strain   PT Home Exercise Plan Hooklying single knee to chest 30s bilateral x 3, 3 times/day; Hooklying lumbar rotation stretch 30s bilateral x 3, 3 times/day   Consulted and Agree with Plan of Care Patient       Patient will benefit from skilled therapeutic intervention in order to improve the following deficits and impairments:  Decreased strength, Pain, Decreased range of motion  Visit Diagnosis: Acute bilateral low back pain without sciatica  Muscle weakness (generalized)     Problem List Patient Active Problem List   Diagnosis Date Noted  . Chronic right-sided low back pain without sciatica 10/03/2016  . BPH (benign prostatic hyperplasia) 07/01/2016  .  Urinary frequency 01/08/2016  . Myasthenia gravis ([place]) 10/09/2015  . Other symptoms and signs involving the musculoskeletal system 09/26/2015  . Binocular vision disorder with diplopia 09/26/2015  . Fatigue 09/26/2015  . Upper extremity weakness 09/20/2015  . TIA (transient ischemic attack) 09/13/2015  . CVA (cerebral infarction) 09/13/2015  . Sweating 07/20/2015  . Cold feet 07/20/2015  . Health care maintenance 01/25/2015  . B12 deficiency 11/16/2014  . Obesity (BMI 30-39.9) 09/27/2014  . Anemia 09/27/2014  . Obesity (BMI 30.0-34.9) 05/29/2014  . Hyponatremia 09/20/2013  . Diabetes mellitus ([place]) 12/06/2011  . Hypercholesterolemia 08/15/2010  . HYPERTENSION, BENIGN 08/15/2010  . PALPITATIONS, OCCASIONAL 08/15/2010   Alanson Puls, PT, DPT [place], [place] S 11/12/2016, 10:49 AM  [place] MAIN [facility] SERVICES [address] [place], [place], [address] Phone: [phone]   Fax:  [phone]  Name: [name] MRN: [id number] Date of Birth: [birth year]

## 2016-11-13 ENCOUNTER — Encounter: Payer: Medicare Other | Admitting: Physical Therapy

## 2016-11-14 ENCOUNTER — Encounter: Payer: Self-pay | Admitting: Physical Therapy

## 2016-11-14 ENCOUNTER — Ambulatory Visit: Payer: Medicare Other | Admitting: Physical Therapy

## 2016-11-14 DIAGNOSIS — M6281 Muscle weakness (generalized): Secondary | ICD-10-CM | POA: Diagnosis not present

## 2016-11-14 DIAGNOSIS — M545 Low back pain, unspecified: Secondary | ICD-10-CM

## 2016-11-14 NOTE — Therapy (Signed)
[place] MAIN [facility] SERVICES [address] [place], [place], [address] Phone: [phone]   Fax:  [phone]  Physical Therapy Treatment  Patient Details  Name: [name] MRN: [id number] Date of Birth: 25-Jun-1926 Referring Provider: Mable Paris  Encounter Date: 11/14/2016      PT End of Session - 11/14/16 0927    Visit Number 4   Number of Visits 13   Date for PT Re-Evaluation 21-Dec-2016   Authorization Type g codes Feb 21, 2023   PT Start Time 0950   PT Stop Time 1030   PT Time Calculation (min) 40 min   Activity Tolerance Patient tolerated treatment well;Patient limited by pain   Behavior During Therapy [facility] for tasks assessed/performed      Past Medical History:  Diagnosis Date  . Allergic rhinitis   . BPH (benign prostatic hypertrophy)   . Degenerative arthritis   . Diabetes mellitus ([place])   . GERD (gastroesophageal reflux disease)   . HTN (hypertension)   . Hypercholesterolemia   . HYPERTENSION, BENIGN 08/15/2010   Qualifier: Diagnosis of  By: Rockey Situ MD, Tim    . Iron deficiency anemia   . Skin cancer   . TIA (transient ischemic attack) 09/13/2015  . Urinary outflow obstruction    mild    Past Surgical History:  Procedure Laterality Date  . PILONIDAL CYST EXCISION  1951   removal  . [place]  . SKIN CANCER EXCISION     multiple  . SQUAMOUS CELL CARCINOMA EXCISION     behind head  . TONSILLECTOMY AND ADENOIDECTOMY  1938    There were no vitals filed for this visit.      Subjective Assessment - 11/14/16 0925    Subjective Patient reports that his pain fluxuates and it is 8/10 today in 2 areas on his back;   Pertinent History Pt reports that he had a right low back muscle strain approximately 6 weeks ago when turning his wife in bed (wife is bedridden). Pain was initially in the right low back but then he started having some pain on the left side of his back as well. Pt reports that he used  capsaicin and lidocaine patches which helped with the pain. Pain has been gradually improving and he is only currently having soreness in his bilateral low back. Approximately 1 year ago he was diagnosed with myasthenia gravis. He went to see [facility] neurology and had immunoglobulin therapy. Reports 85-90% improvement in symptoms since that time. Pt reports that he has also has a "disc problem" in his lower back with pain that runs down his L posterior thigh. He has been seeing a chiropractor for this problem for the last 4-5 years. Denies radicular symptoms related to current episode. Denies N/T or bowel/bladder changes. Pain was initially waking pt from sleep but not currently. Remote history of skin cancer. ROS negative for red flags.    Limitations Standing   How long can you stand comfortably? 15 minutes   Pain Score 8    Pain Location Back   Pain Orientation Left;Right   Pain Onset More than a month ago   Multiple Pain Sites No      Manual therapy: STM to right lateral 12 rib and left SI followed by e-stim to right quadratus muscle Reviewed HEP stretching and use of heat Pt reported relief following manual therapy from 8/10 to 6 /10 to low back and 8/10 quadratus pain prior and  6/10 following manual  therapy His pain decreases to 0/10 in prone but at night his pain gets worse.                            PT Education - 11/14/16 0926    Education provided Yes   Education Details continued use of ice and heat   Person(s) Educated Patient   Methods Explanation   Comprehension Verbalized understanding             PT Long Term Goals - 11/01/16 1451      PT LONG TERM GOAL #1   Title Pt will be independent with HEP in order to improve strength and balance in order to decrease fall risk and improve function at home and work.   Time 6   Period Weeks   Status New     PT LONG TERM GOAL #2   Title Pt will report worst pain as 6/10 on NPRS in order to demonstrate  clinically significant reduction in pain   Baseline 11/01/16: 8/10 worst   Time 6   Period Weeks   Status New     PT LONG TERM GOAL #3   Title Pt will report ability to help turn wife in bed without increase in low back pain   Time 6   Status New     PT LONG TERM GOAL #4   Title Pt will be able to stand for at least 30 minutes before needing to sit due to back pain   Baseline 11/01/16: 15 minutes pain increases   Time 6   Period Weeks   Status New               Plan - 11/14/16 [id number]    Clinical Impression Statement Patient continues to have pain in 2 areas in his low back: right quadratus lateral 12th rib, and left SI. He responds to modalities and manual therapy to reduce his pain.    Rehab Potential Good   Clinical Impairments Affecting Rehab Potential positive: improving symptoms, motivation; negative: age   PT Frequency 2x / week   PT Duration 6 weeks   PT Treatment/Interventions Aquatic Therapy;Cryotherapy;Electrical Stimulation;Iontophoresis 4mg /ml Dexamethasone;Moist Heat;Traction;Ultrasound;Gait training;Therapeutic activities;Therapeutic exercise;Balance training;Neuromuscular re-education;Patient/family education;Manual techniques;Passive range of motion;Dry needling   PT Next Visit Plan Have pt complete mODI, issue pelvic tilts, progress manual and strengthening techniques for lumbar strain   PT Home Exercise Plan Hooklying single knee to chest 30s bilateral x 3, 3 times/day; Hooklying lumbar rotation stretch 30s bilateral x 3, 3 times/day   Consulted and Agree with Plan of Care Patient      Patient will benefit from skilled therapeutic intervention in order to improve the following deficits and impairments:  Decreased strength, Pain, Decreased range of motion  Visit Diagnosis: Acute bilateral low back pain without sciatica  Muscle weakness (generalized)     Problem List Patient Active Problem List   Diagnosis Date Noted  . Chronic right-sided low back  pain without sciatica 10/03/2016  . BPH (benign prostatic hyperplasia) 07/01/2016  . Urinary frequency 01/08/2016  . Myasthenia gravis ([place]) 10/09/2015  . Other symptoms and signs involving the musculoskeletal system 09/26/2015  . Binocular vision disorder with diplopia 09/26/2015  . Fatigue 09/26/2015  . Upper extremity weakness 09/20/2015  . TIA (transient ischemic attack) 09/13/2015  . CVA (cerebral infarction) 09/13/2015  . Sweating 07/20/2015  . Cold feet 07/20/2015  . Health care maintenance 01/25/2015  . B12 deficiency 11/16/2014  .  Obesity (BMI 30-39.9) 09/27/2014  . Anemia 09/27/2014  . Obesity (BMI 30.0-34.9) 05/29/2014  . Hyponatremia 09/20/2013  . Diabetes mellitus ([place]) 12/06/2011  . Hypercholesterolemia 08/15/2010  . HYPERTENSION, BENIGN 08/15/2010  . PALPITATIONS, OCCASIONAL 08/15/2010   Alanson Puls, PT, DPT [place], [place] S 11/14/2016, 10:03 AM  [place] MAIN [facility] SERVICES [address] [place], [place], [address] Phone: [phone]   Fax:  [phone]  Name: ANJELO AMANI MRN: [id number] Date of Birth: 14-Aug-1926

## 2016-11-15 ENCOUNTER — Encounter: Payer: Medicare Other | Admitting: Physical Therapy

## 2016-11-19 ENCOUNTER — Ambulatory Visit: Payer: Medicare Other | Admitting: Physical Therapy

## 2016-11-19 ENCOUNTER — Encounter: Payer: Self-pay | Admitting: Physical Therapy

## 2016-11-19 DIAGNOSIS — M6281 Muscle weakness (generalized): Secondary | ICD-10-CM | POA: Diagnosis not present

## 2016-11-19 DIAGNOSIS — M545 Low back pain, unspecified: Secondary | ICD-10-CM

## 2016-11-19 NOTE — Therapy (Addendum)
[place] MAIN [facility] SERVICES [address] [place], [place], [address] Phone: [phone]   Fax:  [phone]  Physical Therapy Treatment  Patient Details  Name: [name] MRN: [id number] Date of Birth: 24-May-1926 Referring Provider: Mable Paris  Encounter Date: 11/19/2016      PT End of Session - 11/19/16 1003    Visit Number 5   Number of Visits 13   Date for PT Re-Evaluation 12-15-2016   Authorization Type g codes 5/10   PT Start Time 1000   PT Stop Time 1045   PT Time Calculation (min) 45 min   Activity Tolerance Patient tolerated treatment well;Patient limited by pain   Behavior During Therapy [facility] for tasks assessed/performed      Past Medical History:  Diagnosis Date  . Allergic rhinitis   . BPH (benign prostatic hypertrophy)   . Degenerative arthritis   . Diabetes mellitus ([place])   . GERD (gastroesophageal reflux disease)   . HTN (hypertension)   . Hypercholesterolemia   . HYPERTENSION, BENIGN 08/15/2010   Qualifier: Diagnosis of  By: Rockey Situ MD, Tim    . Iron deficiency anemia   . Skin cancer   . TIA (transient ischemic attack) 09/13/2015  . Urinary outflow obstruction    mild    Past Surgical History:  Procedure Laterality Date  . PILONIDAL CYST EXCISION  1951   removal  . [place]  . SKIN CANCER EXCISION     multiple  . SQUAMOUS CELL CARCINOMA EXCISION     behind head  . TONSILLECTOMY AND ADENOIDECTOMY  1938    There were no vitals filed for this visit.      Subjective Assessment - 11/19/16 1004    Subjective Patient reports that his pain is currently 8/10 all across his lower back. Pt states both sides are painful with R being more sore than L. Pt reports he wakes up around 2am every night and has to get up and get in the recliner. Once he is in recliner, he can fall back asleep.   Pertinent History Pt reports that he had a right low back muscle strain approximately 6 weeks  ago when turning his wife in bed (wife is bedridden). Pain was initially in the right low back but then he started having some pain on the left side of his back as well. Pt reports that he used capsaicin and lidocaine patches which helped with the pain. Pain has been gradually improving and he is only currently having soreness in his bilateral low back. Approximately 1 year ago he was diagnosed with myasthenia gravis. He went to see [place] neurology and had immunoglobulin therapy. Reports 85-90% improvement in symptoms since that time. Pt reports that he has also has a "disc problem" in his lower back with pain that runs down his L posterior thigh. He has been seeing a chiropractor for this problem for the last 4-5 years. Denies radicular symptoms related to current episode. Denies N/T or bowel/bladder changes. Pain was initially waking pt from sleep but not currently. Remote history of skin cancer. ROS negative for red flags.    Limitations Standing   How long can you stand comfortably? 15 minutes   Pain Onset More than a month ago       Therapeutic Exercise:  E-stim const. Mode 120 Hz, 50 [place], 50 intensity crossed pattern to right quadratus muscle with moist heat pack over bilateral low back 30 minutes After e-stim and heat,  patient states he can walk better and pain decreased to 6/10 Side stretches over bolster 30 sec hold x3 Reviewed HEP and use of heat at home                           PT Education - 11/19/16 1025    Education provided Yes   Education Details Pt educated on use of heat at home and HEP of stretching   Person(s) Educated Patient   Methods Explanation   Comprehension Verbalized understanding             PT Long Term Goals - 11/01/16 1451      PT LONG TERM GOAL #1   Title Pt will be independent with HEP in order to improve strength and balance in order to decrease fall risk and improve function at home and work.   Time 6   Period Weeks   Status  New     PT LONG TERM GOAL #2   Title Pt will report worst pain as 6/10 on NPRS in order to demonstrate clinically significant reduction in pain   Baseline 11/01/16: 8/10 worst   Time 6   Period Weeks   Status New     PT LONG TERM GOAL #3   Title Pt will report ability to help turn wife in bed without increase in low back pain   Time 6   Status New     PT LONG TERM GOAL #4   Title Pt will be able to stand for at least 30 minutes before needing to sit due to back pain   Baseline 11/01/16: 15 minutes pain increases   Time 6   Period Weeks   Status New               Plan - 11/19/16 1048    Clinical Impression Statement Patient continues to have pain across his low back. He states his right side is worse than left. He responds to modalities and manual therapy to decrease his pain.   Rehab Potential Good   Clinical Impairments Affecting Rehab Potential positive: improving symptoms, motivation; negative: age   PT Frequency 2x / week   PT Duration 6 weeks   PT Treatment/Interventions Aquatic Therapy;Cryotherapy;Electrical Stimulation;Iontophoresis 4mg /ml Dexamethasone;Moist Heat;Traction;Ultrasound;Gait training;Therapeutic activities;Therapeutic exercise;Balance training;Neuromuscular re-education;Patient/family education;Manual techniques;Passive range of motion;Dry needling   PT Next Visit Plan Have pt complete mODI, issue pelvic tilts, progress manual and strengthening techniques for lumbar strain   PT Home Exercise Plan Hooklying single knee to chest 30s bilateral x 3, 3 times/day; Hooklying lumbar rotation stretch 30s bilateral x 3, 3 times/day   Consulted and Agree with Plan of Care Patient      Patient will benefit from skilled therapeutic intervention in order to improve the following deficits and impairments:  Decreased strength, Pain, Decreased range of motion  Visit Diagnosis: Acute bilateral low back pain without sciatica  Muscle weakness  (generalized)     Problem List Patient Active Problem List   Diagnosis Date Noted  . Chronic right-sided low back pain without sciatica 10/03/2016  . BPH (benign prostatic hyperplasia) 07/01/2016  . Urinary frequency 01/08/2016  . Myasthenia gravis ([place]) 10/09/2015  . Other symptoms and signs involving the musculoskeletal system 09/26/2015  . Binocular vision disorder with diplopia 09/26/2015  . Fatigue 09/26/2015  . Upper extremity weakness 09/20/2015  . TIA (transient ischemic attack) 09/13/2015  . CVA (cerebral infarction) 09/13/2015  . Sweating 07/20/2015  . Cold feet  07/20/2015  . Health care maintenance 01/25/2015  . B12 deficiency 11/16/2014  . Obesity (BMI 30-39.9) 09/27/2014  . Anemia 09/27/2014  . Obesity (BMI 30.0-34.9) 05/29/2014  . Hyponatremia 09/20/2013  . Diabetes mellitus ([place]) 12/06/2011  . Hypercholesterolemia 08/15/2010  . HYPERTENSION, BENIGN 08/15/2010  . PALPITATIONS, OCCASIONAL 08/15/2010  This entire session was performed under direct supervision and direction of a licensed therapist/therapist assistant . I have personally read, edited and approve of the note as written. Alanson Puls, PT, DPT [place], SPT [place], Sherryl Barters 11/19/2016, 11:13 AM  [place] MAIN [facility] SERVICES [address] [place], [place], [address] Phone: [phone]   Fax:  [phone]  Name: [name] MRN: [id number] Date of Birth: September 27, 1926

## 2016-11-20 ENCOUNTER — Telehealth: Payer: Self-pay | Admitting: Internal Medicine

## 2016-11-20 MED ORDER — GLUCOSE BLOOD VI STRP: ORAL_STRIP | 3 refills | Status: DC

## 2016-11-20 MED ORDER — ACCU-CHEK SOFTCLIX LANCETS MISC: 12 refills | Status: DC

## 2016-11-20 NOTE — Telephone Encounter (Signed)
RX sent in and patient notified

## 2016-11-20 NOTE — Telephone Encounter (Signed)
Pt called and stated that the pharmacy stated that they cannot get in touch with [place] to get a doctor's work order for pt's test strips. Please advise, thank you!  Pharmacy - [facility], [place]  Call pt @ [phone]

## 2016-11-22 ENCOUNTER — Ambulatory Visit: Payer: Medicare Other | Admitting: Physical Therapy

## 2016-11-24 ENCOUNTER — Encounter: Payer: Self-pay | Admitting: Internal Medicine

## 2016-11-26 ENCOUNTER — Ambulatory Visit: Payer: Medicare Other | Admitting: Physical Therapy

## 2016-11-26 ENCOUNTER — Encounter: Payer: Self-pay | Admitting: Physical Therapy

## 2016-11-26 DIAGNOSIS — M6281 Muscle weakness (generalized): Secondary | ICD-10-CM

## 2016-11-26 DIAGNOSIS — M545 Low back pain, unspecified: Secondary | ICD-10-CM

## 2016-11-26 NOTE — Telephone Encounter (Signed)
Called pharmacy [facility] and spoke with pharmacist Audelia Acton he  will fax DWO over for MD to fill out along with script.

## 2016-11-26 NOTE — Therapy (Addendum)
[place] MAIN [facility] SERVICES [address] [place], [place], [address] Phone: [phone]   Fax:  [phone]  Physical Therapy Treatment  Patient Details  Name: [name] MRN: [id number] Date of Birth: 13-Jul-1926 Referring Provider: Mable Paris  Encounter Date: 11/26/2016      PT End of Session - 11/26/16 1030    Visit Number 6   Number of Visits 13   Date for PT Re-Evaluation 2017-01-01   Authorization Type g codes 6/10   PT Start Time 1000   PT Stop Time 1045   PT Time Calculation (min) 45 min   Activity Tolerance Patient tolerated treatment well;Patient limited by pain   Behavior During Therapy [facility] for tasks assessed/performed      Past Medical History:  Diagnosis Date  . Allergic rhinitis   . BPH (benign prostatic hypertrophy)   . Degenerative arthritis   . Diabetes mellitus ([place])   . GERD (gastroesophageal reflux disease)   . HTN (hypertension)   . Hypercholesterolemia   . HYPERTENSION, BENIGN 08/15/2010   Qualifier: Diagnosis of  By: Rockey Situ MD, Tim    . Iron deficiency anemia   . Skin cancer   . TIA (transient ischemic attack) 09/13/2015  . Urinary outflow obstruction    mild    Past Surgical History:  Procedure Laterality Date  . PILONIDAL CYST EXCISION  1951   removal  . [place]  . SKIN CANCER EXCISION     multiple  . SQUAMOUS CELL CARCINOMA EXCISION     behind head  . TONSILLECTOMY AND ADENOIDECTOMY  1938    There were no vitals filed for this visit.      Subjective Assessment - 11/26/16 1027    Subjective Patient reports that he was thinking about getting the snow off his porch and he had a bad spasm in his back before he got started. He reports that he is getting better and having no spasms in the morning when he gets up out of bed.   Pertinent History Pt reports that he had a right low back muscle strain approximately 6 weeks ago when turning his wife in bed (wife is  bedridden). Pain was initially in the right low back but then he started having some pain on the left side of his back as well. Pt reports that he used capsaicin and lidocaine patches which helped with the pain. Pain has been gradually improving and he is only currently having soreness in his bilateral low back. Approximately 1 year ago he was diagnosed with myasthenia gravis. He went to see [facility] neurology and had immunoglobulin therapy. Reports 85-90% improvement in symptoms since that time. Pt reports that he has also has a "disc problem" in his lower back with pain that runs down his L posterior thigh. He has been seeing a chiropractor for this problem for the last 4-5 years. Denies radicular symptoms related to current episode. Denies N/T or bowel/bladder changes. Pain was initially waking pt from sleep but not currently. Remote history of skin cancer. ROS negative for red flags.    Limitations Standing   How long can you stand comfortably? 15 minutes   Currently in Pain? Yes   Pain Score 6    Pain Location Back   Pain Orientation Left   Pain Descriptors / Indicators Sore   Pain Type Chronic pain   Pain Onset More than a month ago   Multiple Pain Sites No  Manual therapy : STM to left lower back and left SI joint musculature, PA glides to left SI x 30 bouts x 3 E-stim const. mode, 12 Hz, 50 [place] 60 intensity to low back right and left L5 crossed pattern tens x 20 minutes with MH Reviewed body mechanics for getting in and out of bed;  sit to stand; and for standing wiht one LE up on a stool Patient presents with 6/10 pain to left side low back and pain decreases to 4 /10 following treatment.                           PT Education - 11/26/16 1029    Education provided Yes   Education Details Patient educated to continue to use the heating pad   Person(s) Educated Patient   Methods Explanation   Comprehension Verbalized understanding             PT Long  Term Goals - 11/01/16 1451      PT LONG TERM GOAL #1   Title Pt will be independent with HEP in order to improve strength and balance in order to decrease fall risk and improve function at home and work.   Time 6   Period Weeks   Status New     PT LONG TERM GOAL #2   Title Pt will report worst pain as 6/10 on NPRS in order to demonstrate clinically significant reduction in pain   Baseline 11/01/16: 8/10 worst   Time 6   Period Weeks   Status New     PT LONG TERM GOAL #3   Title Pt will report ability to help turn wife in bed without increase in low back pain   Time 6   Status New     PT LONG TERM GOAL #4   Title Pt will be able to stand for at least 30 minutes before needing to sit due to back pain   Baseline 11/01/16: 15 minutes pain increases   Time 6   Period Weeks   Status New               Plan - 11/26/16 1030    Clinical Impression Statement Patient is eduated about use of heat and body mechnics and correct standing position with use of stool. He repsonds to manual therpy STM to left lower back muscles and e-stim across low back.    Rehab Potential Good   Clinical Impairments Affecting Rehab Potential positive: improving symptoms, motivation; negative: age   PT Frequency 2x / week   PT Duration 6 weeks   PT Treatment/Interventions Aquatic Therapy;Cryotherapy;Electrical Stimulation;Iontophoresis 4mg /ml Dexamethasone;Moist Heat;Traction;Ultrasound;Gait training;Therapeutic activities;Therapeutic exercise;Balance training;Neuromuscular re-education;Patient/family education;Manual techniques;Passive range of motion;Dry needling   PT Next Visit Plan Have pt complete mODI, issue pelvic tilts, progress manual and strengthening techniques for lumbar strain   PT Home Exercise Plan Hooklying single knee to chest 30s bilateral x 3, 3 times/day; Hooklying lumbar rotation stretch 30s bilateral x 3, 3 times/day   Consulted and Agree with Plan of Care Patient      Patient will  benefit from skilled therapeutic intervention in order to improve the following deficits and impairments:  Decreased strength, Pain, Decreased range of motion  Visit Diagnosis: Acute bilateral low back pain without sciatica  Muscle weakness (generalized)     Problem List Patient Active Problem List   Diagnosis Date Noted  . Chronic right-sided low back pain without sciatica 10/03/2016  . BPH (  benign prostatic hyperplasia) 07/01/2016  . Urinary frequency 01/08/2016  . Myasthenia gravis ([place]) 10/09/2015  . Other symptoms and signs involving the musculoskeletal system 09/26/2015  . Binocular vision disorder with diplopia 09/26/2015  . Fatigue 09/26/2015  . Upper extremity weakness 09/20/2015  . TIA (transient ischemic attack) 09/13/2015  . CVA (cerebral infarction) 09/13/2015  . Sweating 07/20/2015  . Cold feet 07/20/2015  . Health care maintenance 01/25/2015  . B12 deficiency 11/16/2014  . Obesity (BMI 30-39.9) 09/27/2014  . Anemia 09/27/2014  . Obesity (BMI 30.0-34.9) 05/29/2014  . Hyponatremia 09/20/2013  . Diabetes mellitus ([place]) 12/06/2011  . Hypercholesterolemia 08/15/2010  . HYPERTENSION, BENIGN 08/15/2010  . PALPITATIONS, OCCASIONAL 08/15/2010   Alanson Puls, PT, DPT [place], Minette Headland S 11/26/2016, 10:43 AM  [place] MAIN [facility] SERVICES [address] [place], [place], [address] Phone: [phone]   Fax:  [phone]  Name: [name] MRN: [id number] Date of Birth: [birth year]

## 2016-11-26 NOTE — Telephone Encounter (Signed)
I have not seen any request for test strips for this pt.  Thank you for calling pharmacy.  Please hold until we receive.  Thanks.

## 2016-11-26 NOTE — Telephone Encounter (Signed)
I looked through paperwork for today I do not see this form , called pharmacy an ask to refax?

## 2016-11-26 NOTE — Telephone Encounter (Signed)
Spoke with patient and will fax script for test strips to [facility] per patients request .

## 2016-11-27 NOTE — Telephone Encounter (Signed)
Never received.  Please hold until receive.   Thanks

## 2016-11-28 ENCOUNTER — Other Ambulatory Visit (INDEPENDENT_AMBULATORY_CARE_PROVIDER_SITE_OTHER): Payer: Medicare Other

## 2016-11-28 DIAGNOSIS — E119 Type 2 diabetes mellitus without complications: Secondary | ICD-10-CM

## 2016-11-28 DIAGNOSIS — E7800 Pure hypercholesterolemia, unspecified: Secondary | ICD-10-CM

## 2016-11-28 DIAGNOSIS — I10 Essential (primary) hypertension: Secondary | ICD-10-CM

## 2016-11-28 LAB — HEPATIC FUNCTION PANEL
ALT: 16 U/L (ref 0–53)
AST: 18 U/L (ref 0–37)
Albumin: 4.2 g/dL (ref 3.5–5.2)
Alkaline Phosphatase: 52 U/L (ref 39–117)
Bilirubin, Direct: 0.2 mg/dL (ref 0.0–0.3)
Total Bilirubin: 0.9 mg/dL (ref 0.2–1.2)
Total Protein: 7.4 g/dL (ref 6.0–8.3)

## 2016-11-28 LAB — BASIC METABOLIC PANEL
BUN: 19 mg/dL (ref 6–23)
CHLORIDE: 97 meq/L (ref 96–112)
CO2: 30 meq/L (ref 19–32)
CREATININE: 0.88 mg/dL (ref 0.40–1.50)
Calcium: 9.8 mg/dL (ref 8.4–10.5)
GFR: 86.83 mL/min (ref >60.00)
Glucose, Bld: 149 mg/dL — ABNORMAL HIGH (ref 70–99)
POTASSIUM: 4.5 meq/L (ref 3.5–5.1)
Sodium: 133 mEq/L — ABNORMAL LOW (ref 135–145)

## 2016-11-28 LAB — CBC WITH DIFFERENTIAL/PLATELET
Basophils Absolute: 0 10*3/uL (ref 0.0–0.1)
Basophils Relative: 0.5 % (ref 0.0–3.0)
Eosinophils Absolute: 0.1 10*3/uL (ref 0.0–0.7)
Eosinophils Relative: 1.8 % (ref 0.0–5.0)
HCT: 37.8 % — ABNORMAL LOW (ref 39.0–52.0)
Hemoglobin: 12.7 g/dL — ABNORMAL LOW (ref 13.0–17.0)
Lymphocytes Relative: 16.8 % (ref 12.0–46.0)
Lymphs Abs: 1.1 10*3/uL (ref 0.7–4.0)
MCHC: 33.5 g/dL (ref 30.0–36.0)
MCV: 92.4 fl (ref 78.0–100.0)
Monocytes Absolute: 0.4 10*3/uL (ref 0.1–1.0)
Monocytes Relative: 5.6 % (ref 3.0–12.0)
Neutro Abs: 4.8 10*3/uL (ref 1.4–7.7)
Neutrophils Relative %: 75.3 % (ref 43.0–77.0)
Platelets: 316 10*3/uL (ref 150.0–400.0)
RBC: 4.09 Mil/uL — ABNORMAL LOW (ref 4.22–5.81)
RDW: 13.1 % (ref 11.5–15.5)
WBC: 6.3 10*3/uL (ref 4.0–10.5)

## 2016-11-28 LAB — LIPID PANEL
CHOL/HDL RATIO: 3
CHOLESTEROL: 175 mg/dL (ref 0–200)
HDL: 57.4 mg/dL (ref >39.00)
LDL Cholesterol: 93 mg/dL (ref 0–99)
NonHDL: 117.54
TRIGLYCERIDES: 121 mg/dL (ref 0.0–149.0)
VLDL: 24.2 mg/dL (ref 0.0–40.0)

## 2016-11-28 LAB — HEMOGLOBIN A1C: Hgb A1c MFr Bld: 7.3 % — ABNORMAL HIGH (ref 4.6–6.5)

## 2016-11-29 ENCOUNTER — Ambulatory Visit: Payer: Medicare Other | Admitting: Physical Therapy

## 2016-11-29 ENCOUNTER — Encounter: Payer: Self-pay | Admitting: Physical Therapy

## 2016-11-29 ENCOUNTER — Other Ambulatory Visit: Payer: Self-pay | Admitting: Internal Medicine

## 2016-11-29 DIAGNOSIS — E871 Hypo-osmolality and hyponatremia: Secondary | ICD-10-CM

## 2016-11-29 DIAGNOSIS — M6281 Muscle weakness (generalized): Secondary | ICD-10-CM

## 2016-11-29 DIAGNOSIS — G7000 Myasthenia gravis without (acute) exacerbation: Secondary | ICD-10-CM

## 2016-11-29 DIAGNOSIS — E538 Deficiency of other specified B group vitamins: Secondary | ICD-10-CM

## 2016-11-29 DIAGNOSIS — M545 Low back pain, unspecified: Secondary | ICD-10-CM

## 2016-11-29 DIAGNOSIS — D649 Anemia, unspecified: Secondary | ICD-10-CM

## 2016-11-29 NOTE — Progress Notes (Signed)
Orders placed for f/u labs.  

## 2016-11-29 NOTE — Therapy (Addendum)
[place] MAIN [facility] SERVICES [address] [place], [place], [address] Phone: [phone]   Fax:  [phone]  Physical Therapy Treatment  Patient Details  Name: [name] MRN: [id number] Date of Birth: 03-19-1926 Referring Provider: Mable Paris  Encounter Date: 11/29/2016      PT End of Session - 11/29/16 1025    Visit Number 7   Number of Visits 13   Date for PT Re-Evaluation 12/25/16   Authorization Type g codes 7/10   PT Start Time 1000   PT Stop Time 1045   PT Time Calculation (min) 45 min   Activity Tolerance Patient tolerated treatment well;Patient limited by pain   Behavior During Therapy [facility] for tasks assessed/performed      Past Medical History:  Diagnosis Date  . Allergic rhinitis   . BPH (benign prostatic hypertrophy)   . Degenerative arthritis   . Diabetes mellitus ([place])   . GERD (gastroesophageal reflux disease)   . HTN (hypertension)   . Hypercholesterolemia   . HYPERTENSION, BENIGN 08/15/2010   Qualifier: Diagnosis of  By: Rockey Situ MD, Tim    . Iron deficiency anemia   . Skin cancer   . TIA (transient ischemic attack) 09/13/2015  . Urinary outflow obstruction    mild    Past Surgical History:  Procedure Laterality Date  . PILONIDAL CYST EXCISION  1951   removal  . [place]  . SKIN CANCER EXCISION     multiple  . SQUAMOUS CELL CARCINOMA EXCISION     behind head  . TONSILLECTOMY AND ADENOIDECTOMY  1938    There were no vitals filed for this visit.      Subjective Assessment - 11/29/16 1024    Subjective Patient reports that his back pain is 10/10 today. It has settled into one spot on his low back. Patient is standing to feed his wife in the morning who is bed bound.    Pertinent History Pt reports that he had a right low back muscle strain approximately 6 weeks ago when turning his wife in bed (wife is bedridden). Pain was initially in the right low back but then he  started having some pain on the left side of his back as well. Pt reports that he used capsaicin and lidocaine patches which helped with the pain. Pain has been gradually improving and he is only currently having soreness in his bilateral low back. Approximately 1 year ago he was diagnosed with myasthenia gravis. He went to see [place] neurology and had immunoglobulin therapy. Reports 85-90% improvement in symptoms since that time. Pt reports that he has also has a "disc problem" in his lower back with pain that runs down his L posterior thigh. He has been seeing a chiropractor for this problem for the last 4-5 years. Denies radicular symptoms related to current episode. Denies N/T or bowel/bladder changes. Pain was initially waking pt from sleep but not currently. Remote history of skin cancer. ROS negative for red flags.    Limitations Standing   How long can you stand comfortably? 15 minutes   Currently in Pain? Yes   Pain Score 10-Worst pain ever   Pain Orientation Left   Pain Descriptors / Indicators Aching   Pain Type Chronic pain   Pain Onset More than a month ago   Multiple Pain Sites No      Manual therapy : STM to left lower back and left SI joint musculature, PA glides to left  SI x 30 bouts x 3 E-stim Const. mode 120 Hz, 50 [place] 65 intensity to low back right and left L5 crossed pattern tens x 20 minutes with MH Reviewed body mechanics for getting in and out of bed;  sit to stand; and for standing wiht one LE up on a stool, and for caring for his wife at her bedside  Patient presents with 10/10 pain to left side low back and pain decreases to 8/10 following treatment.                           PT Education - 11/29/16 1025    Education provided Yes   Education Details Use of ice   Person(s) Educated Patient   Methods Explanation   Comprehension Verbalized understanding             PT Long Term Goals - 11/01/16 1451      PT LONG TERM GOAL #1   Title Pt  will be independent with HEP in order to improve strength and balance in order to decrease fall risk and improve function at home and work.   Time 6   Period Weeks   Status New     PT LONG TERM GOAL #2   Title Pt will report worst pain as 6/10 on NPRS in order to demonstrate clinically significant reduction in pain   Baseline 11/01/16: 8/10 worst   Time 6   Period Weeks   Status New     PT LONG TERM GOAL #3   Title Pt will report ability to help turn wife in bed without increase in low back pain   Time 6   Status New     PT LONG TERM GOAL #4   Title Pt will be able to stand for at least 30 minutes before needing to sit due to back pain   Baseline 11/01/16: 15 minutes pain increases   Time 6   Period Weeks   Status New               Plan - 11/29/16 1026    Clinical Impression Statement Patient was instructed in body mechanics for when he needs to care for his bedbound wife. He responds to manual therapy for left SI and low back pain  followed by TENS for pain control.    Rehab Potential Good   Clinical Impairments Affecting Rehab Potential positive: improving symptoms, motivation; negative: age   PT Frequency 2x / week   PT Duration 6 weeks   PT Treatment/Interventions Aquatic Therapy;Cryotherapy;Electrical Stimulation;Iontophoresis 4mg /ml Dexamethasone;Moist Heat;Traction;Ultrasound;Gait training;Therapeutic activities;Therapeutic exercise;Balance training;Neuromuscular re-education;Patient/family education;Manual techniques;Passive range of motion;Dry needling   PT Next Visit Plan Have pt complete mODI, issue pelvic tilts, progress manual and strengthening techniques for lumbar strain   PT Home Exercise Plan Hooklying single knee to chest 30s bilateral x 3, 3 times/day; Hooklying lumbar rotation stretch 30s bilateral x 3, 3 times/day   Consulted and Agree with Plan of Care Patient      Patient will benefit from skilled therapeutic intervention in order to improve the  following deficits and impairments:  Decreased strength, Pain, Decreased range of motion, Abnormal gait  Visit Diagnosis: Acute bilateral low back pain without sciatica  Muscle weakness (generalized)     Problem List Patient Active Problem List   Diagnosis Date Noted  . Chronic right-sided low back pain without sciatica 10/03/2016  . BPH (benign prostatic hyperplasia) 07/01/2016  . Urinary frequency 01/08/2016  .  Myasthenia gravis ([place]) 10/09/2015  . Other symptoms and signs involving the musculoskeletal system 09/26/2015  . Binocular vision disorder with diplopia 09/26/2015  . Fatigue 09/26/2015  . Upper extremity weakness 09/20/2015  . TIA (transient ischemic attack) 09/13/2015  . CVA (cerebral infarction) 09/13/2015  . Sweating 07/20/2015  . Cold feet 07/20/2015  . Health care maintenance 01/25/2015  . B12 deficiency 11/16/2014  . Obesity (BMI 30-39.9) 09/27/2014  . Anemia 09/27/2014  . Obesity (BMI 30.0-34.9) 05/29/2014  . Hyponatremia 09/20/2013  . Diabetes mellitus ([place]) 12/06/2011  . Hypercholesterolemia 08/15/2010  . HYPERTENSION, BENIGN 08/15/2010  . PALPITATIONS, OCCASIONAL 08/15/2010  Alanson Puls, PT, DPT  [place], Minette Headland S 11/29/2016, 12:24 PM  [place] MAIN [facility] SERVICES [address] [place], [place], [address] Phone: [phone]   Fax:  [phone]  Name: [name] MRN: [id number] Date of Birth: [birth year]

## 2016-12-03 ENCOUNTER — Ambulatory Visit: Payer: Medicare Other | Admitting: Physical Therapy

## 2016-12-03 ENCOUNTER — Encounter: Payer: Self-pay | Admitting: Physical Therapy

## 2016-12-03 DIAGNOSIS — M545 Low back pain, unspecified: Secondary | ICD-10-CM

## 2016-12-03 DIAGNOSIS — M6281 Muscle weakness (generalized): Secondary | ICD-10-CM | POA: Diagnosis not present

## 2016-12-03 NOTE — Therapy (Addendum)
[place] MAIN [facility] SERVICES [address] [place], [place], [address] Phone: [phone]   Fax:  [phone]  Physical Therapy Treatment  Patient Details  Name: [name] MRN: [id number] Date of Birth: [birth year] Referring Provider: Mable Paris  Encounter Date: 12/03/2016      PT End of Session - 12/03/16 1023    Visit Number 8   Number of Visits 13   Date for PT Re-Evaluation 2017/01/10   Authorization Type g codes 8/10   PT Start Time 1000   PT Stop Time 1045   PT Time Calculation (min) 45 min   Activity Tolerance Patient tolerated treatment well;Patient limited by pain   Behavior During Therapy [facility] for tasks assessed/performed      Past Medical History:  Diagnosis Date  . Allergic rhinitis   . BPH (benign prostatic hypertrophy)   . Degenerative arthritis   . Diabetes mellitus ([place])   . GERD (gastroesophageal reflux disease)   . HTN (hypertension)   . Hypercholesterolemia   . HYPERTENSION, BENIGN 08/15/2010   Qualifier: Diagnosis of  By: Rockey Situ MD, Tim    . Iron deficiency anemia   . Skin cancer   . TIA (transient ischemic attack) 09/13/2015  . Urinary outflow obstruction    mild    Past Surgical History:  Procedure Laterality Date  . PILONIDAL CYST EXCISION  1951   removal  . [place]  . SKIN CANCER EXCISION     multiple  . SQUAMOUS CELL CARCINOMA EXCISION     behind head  . TONSILLECTOMY AND ADENOIDECTOMY  1938    There were no vitals filed for this visit.      Subjective Assessment - 12/03/16 1021    Subjective Patient reports that his back pain is 10/10 today. It has changed to the left low back. Patient is now sitting  to feed his wife in the morning who is bed bound. He now has back pain that begins while he is cookling his wlfes dinner. He is able to get up out of a chair better today.   Pertinent History Pt reports that he had a right low back muscle strain approximately 6  weeks ago when turning his wife in bed (wife is bedridden). Pain was initially in the right low back but then he started having some pain on the left side of his back as well. Pt reports that he used capsaicin and lidocaine patches which helped with the pain. Pain has been gradually improving and he is only currently having soreness in his bilateral low back. Approximately 1 year ago he was diagnosed with myasthenia gravis. He went to see [facility] and had immunoglobulin therapy. Reports 85-90% improvement in symptoms since that time. Pt reports that he has also has a "disc problem" in his lower back with pain that runs down his L posterior thigh. He has been seeing a chiropractor for this problem for the last 4-5 years. Denies radicular symptoms related to current episode. Denies N/T or bowel/bladder changes. Pain was initially waking pt from sleep but not currently. Remote history of skin cancer. ROS negative for red flags.    Limitations Standing   How long can you stand comfortably? 15 minutes   Pain Onset More than a month ago      Manual therapy : STM to L1-L5 and left  paraspinal musculature, PA glides L1-L5 grade 3  x 30 bouts x 3 E-stim to low back left paraspinal muscles  L5 crossed pattern tens; constant mode; 120 Hz, 50 [place], and intensity of 80  x 20 minutes with MH Reviewed body mechanics for getting in and out of bed; sit to stand; and for standing with one LE up on a stool, and for caring for his wife at her bedside  Patient presents with 10/10 pain to left side low back and pain decreases to 8/10 following treatment.                           PT Education - 12/03/16 1023    Education provided Yes   Education Details better posture for standing in the kitchen cooking   Person(s) Educated Patient   Methods Explanation   Comprehension Verbalized understanding             PT Long Term Goals - 11/01/16 1451      PT LONG TERM GOAL #1   Title Pt will  be independent with HEP in order to improve strength and balance in order to decrease fall risk and improve function at home and work.   Time 6   Period Weeks   Status New     PT LONG TERM GOAL #2   Title Pt will report worst pain as 6/10 on NPRS in order to demonstrate clinically significant reduction in pain   Baseline 11/01/16: 8/10 worst   Time 6   Period Weeks   Status New     PT LONG TERM GOAL #3   Title Pt will report ability to help turn wife in bed without increase in low back pain   Time 6   Status New     PT LONG TERM GOAL #4   Title Pt will be able to stand for at least 30 minutes before needing to sit due to back pain   Baseline 11/01/16: 15 minutes pain increases   Time 6   Period Weeks   Status New               Plan - 12/03/16 1024    Clinical Impression Statement Patient was instructed in standing extension exercises and he responds to manual therapy and e-stim to low back. He has pain that changes location in low back and SI joint left side. Today  he is having pain that is  L1-L5 and the left SI is better.    Rehab Potential Good   Clinical Impairments Affecting Rehab Potential positive: improving symptoms, motivation; negative: age   PT Frequency 2x / week   PT Duration 6 weeks   PT Treatment/Interventions Aquatic Therapy;Cryotherapy;Electrical Stimulation;Iontophoresis 4mg /ml Dexamethasone;Moist Heat;Traction;Ultrasound;Gait training;Therapeutic activities;Therapeutic exercise;Balance training;Neuromuscular re-education;Patient/family education;Manual techniques;Passive range of motion;Dry needling   PT Next Visit Plan Have pt complete mODI, issue pelvic tilts, progress manual and strengthening techniques for lumbar strain   PT Home Exercise Plan Hooklying single knee to chest 30s bilateral x 3, 3 times/day; Hooklying lumbar rotation stretch 30s bilateral x 3, 3 times/day   Consulted and Agree with Plan of Care Patient      Patient will benefit from  skilled therapeutic intervention in order to improve the following deficits and impairments:  Decreased strength, Pain, Decreased range of motion, Abnormal gait  Visit Diagnosis: Acute bilateral low back pain without sciatica  Muscle weakness (generalized)     Problem List Patient Active Problem List   Diagnosis Date Noted  . Chronic right-sided low back pain without sciatica 10/03/2016  . BPH (benign prostatic hyperplasia) 07/01/2016  .  Urinary frequency 01/08/2016  . Myasthenia gravis ([place]) 10/09/2015  . Other symptoms and signs involving the musculoskeletal system 09/26/2015  . Binocular vision disorder with diplopia 09/26/2015  . Fatigue 09/26/2015  . Upper extremity weakness 09/20/2015  . TIA (transient ischemic attack) 09/13/2015  . CVA (cerebral infarction) 09/13/2015  . Sweating 07/20/2015  . Cold feet 07/20/2015  . Health care maintenance 01/25/2015  . B12 deficiency 11/16/2014  . Obesity (BMI 30-39.9) 09/27/2014  . Anemia 09/27/2014  . Obesity (BMI 30.0-34.9) 05/29/2014  . Hyponatremia 09/20/2013  . Diabetes mellitus ([place]) 12/06/2011  . Hypercholesterolemia 08/15/2010  . HYPERTENSION, BENIGN 08/15/2010  . PALPITATIONS, OCCASIONAL 08/15/2010   Alanson Puls, PT, DPT [place], [place] S 12/03/2016, 10:27 AM  [place] MAIN [facility] SERVICES [address] [place], [place], [address] Phone: [phone]   Fax:  [phone]  Name: [name] MRN: [id number] Date of Birth: [birth year]

## 2016-12-04 DIAGNOSIS — M9903 Segmental and somatic dysfunction of lumbar region: Secondary | ICD-10-CM | POA: Diagnosis not present

## 2016-12-04 DIAGNOSIS — M955 Acquired deformity of pelvis: Secondary | ICD-10-CM | POA: Diagnosis not present

## 2016-12-04 DIAGNOSIS — M9905 Segmental and somatic dysfunction of pelvic region: Secondary | ICD-10-CM | POA: Diagnosis not present

## 2016-12-04 DIAGNOSIS — M5136 Other intervertebral disc degeneration, lumbar region: Secondary | ICD-10-CM | POA: Diagnosis not present

## 2016-12-05 NOTE — Telephone Encounter (Signed)
Confirmed with [facility] pharmacy April patient picked up test strips on 11/27/16.

## 2016-12-06 ENCOUNTER — Ambulatory Visit: Payer: Medicare Other | Attending: Family | Admitting: Physical Therapy

## 2016-12-06 ENCOUNTER — Encounter: Payer: Self-pay | Admitting: Physical Therapy

## 2016-12-06 DIAGNOSIS — M545 Low back pain, unspecified: Secondary | ICD-10-CM

## 2016-12-06 DIAGNOSIS — M6281 Muscle weakness (generalized): Secondary | ICD-10-CM

## 2016-12-06 NOTE — Therapy (Signed)
[place] MAIN [facility] SERVICES [address] [place], [place], [address] Phone: [phone]   Fax:  [phone]  Physical Therapy Treatment  Patient Details  Name: [name] MRN: [id number] Date of Birth: [birth year] Referring Provider: Mable Paris  Encounter Date: 12/06/2016      PT End of Session - 12/06/16 1010    Visit Number 9   Number of Visits 13   Date for PT Re-Evaluation 12-21-2016   Authorization Type g codes 07-24-2023   PT Start Time 01/11/1004   PT Stop Time 1045   PT Time Calculation (min) 40 min   Activity Tolerance Patient tolerated treatment well;Patient limited by pain   Behavior During Therapy [facility] for tasks assessed/performed      Past Medical History:  Diagnosis Date  . Allergic rhinitis   . BPH (benign prostatic hypertrophy)   . Degenerative arthritis   . Diabetes mellitus ([place])   . GERD (gastroesophageal reflux disease)   . HTN (hypertension)   . Hypercholesterolemia   . HYPERTENSION, BENIGN 08/15/2010   Qualifier: Diagnosis of  By: Rockey Situ MD, Tim    . Iron deficiency anemia   . Skin cancer   . TIA (transient ischemic attack) 09/13/2015  . Urinary outflow obstruction    mild    Past Surgical History:  Procedure Laterality Date  . PILONIDAL CYST EXCISION  1951   removal  . [place]  . SKIN CANCER EXCISION     multiple  . SQUAMOUS CELL CARCINOMA EXCISION     behind head  . TONSILLECTOMY AND ADENOIDECTOMY  1938    There were no vitals filed for this visit.      Subjective Assessment - 12/06/16 1009    Subjective Patient reports that his back pain is 6/10 today. It has changed to the left and right  low back.He has a helper now to assist with making his wife breakfast.. He is able to get up out of a chair better today.   Pertinent History Pt reports that he had a right low back muscle strain approximately 6 weeks ago when turning his wife in bed (wife is bedridden). Pain was  initially in the right low back but then he started having some pain on the left side of his back as well. Pt reports that he used capsaicin and lidocaine patches which helped with the pain. Pain has been gradually improving and he is only currently having soreness in his bilateral low back. Approximately 1 year ago he was diagnosed with myasthenia gravis. He went to see [facility] neurolog[facility] and had immunoglobulin therapy. Reports 85-90% improvement in symptoms since that time. Pt reports that he has also has a "disc problem" in his lower back with pain that runs down his L posterior thigh. He has been seeing a chiropractor for this problem for the last 4-5 years. Denies radicular symptoms related to current episode. Denies N/T or bowel/bladder changes. Pain was initially waking pt from sleep but not currently. Remote history of skin cancer. ROS negative for red flags.    Limitations Standing   How long can you stand comfortably? 15 minutes   Pain Onset More than a month ago      Manual therapy : STM to L1-L5 and left and right  paraspinal musculature with tennis ball and roller stick  PA glides L1-L5 grade 3  x 30 bouts x 3 E-stim to low back left paraspinal muscles L5 crossed pattern tens; constant mode; 120  Hz, 50 [place], and intensity of 60  x 20 minutes with MH Therapeutic exercise: Standing back extension x 10 x 3 sets Reviewed body mechanics for getting in and out of bed; sit to stand; and for standing with one LE up on a stool, and for caring for his wife at her bedside  Patient presents with 6/10 pain to left side low back and pain decreases to 4/10 following treatment.           PT Long Term Goals - 12/06/16 1040      PT LONG TERM GOAL #1   Title Pt will be independent with HEP in order to improve strength and balance in order to decrease fall risk and improve function at home and work.   Time 6   Period Weeks   Status Partially Met     PT LONG TERM GOAL #2   Title Pt will report worst  pain as 6/10 on NPRS in order to demonstrate clinically significant reduction in pain   Baseline 6/10   Time 6   Period Weeks   Status Achieved     PT LONG TERM GOAL #3   Title Pt will report ability to help turn wife in bed without increase in low back pain   Time 6   Status Partially Met     PT LONG TERM GOAL #4   Title Pt will be able to stand for at least 30 minutes before needing to sit due to back pain   Baseline 15 minutes   Time 6   Period Weeks   Status On-going                          PT Education - 12/06/16 1010    Education provided Yes   Education Details ice and heat   Person(s) Educated Patient   Methods Explanation   Comprehension Verbalized understanding             PT Long Term Goals - 11/01/16 1451      PT LONG TERM GOAL #1   Title Pt will be independent with HEP in order to improve strength and balance in order to decrease fall risk and improve function at home and work.   Time 6   Period Weeks   Status New     PT LONG TERM GOAL #2   Title Pt will report worst pain as 6/10 on NPRS in order to demonstrate clinically significant reduction in pain   Baseline 11/01/16: 8/10 worst   Time 6   Period Weeks   Status New     PT LONG TERM GOAL #3   Title Pt will report ability to help turn wife in bed without increase in low back pain   Time 6   Status New     PT LONG TERM GOAL #4   Title Pt will be able to stand for at least 30 minutes before needing to sit due to back pain   Baseline 11/01/16: 15 minutes pain increases   Time 6   Period Weeks   Status New               Plan - 12/06/16 1028    Clinical Impression Statement Patient demonstated decrease in back pain/discomfort after manual therapy, e-stim and MH to low back. He is able to ambulate with non antalgic gait and has a increased gait speed following manual therapy.. Patient will benefit from further skilled therapy  focused on improving back pain and  strengthening to perform household ADL's and take care of his wife.     Rehab Potential Good   Clinical Impairments Affecting Rehab Potential positive: improving symptoms, motivation; negative: age   PT Frequency 2x / week   PT Duration 6 weeks   PT Treatment/Interventions Aquatic Therapy;Cryotherapy;Electrical Stimulation;Iontophoresis 69m/ml Dexamethasone;Moist Heat;Traction;Ultrasound;Gait training;Therapeutic activities;Therapeutic exercise;Balance training;Neuromuscular re-education;Patient/family education;Manual techniques;Passive range of motion;Dry needling   PT Next Visit Plan Have pt complete mODI, issue pelvic tilts, progress manual and strengthening techniques for lumbar strain   PT Home Exercise Plan Hooklying single knee to chest 30s bilateral x 3, 3 times/day; Hooklying lumbar rotation stretch 30s bilateral x 3, 3 times/day   Consulted and Agree with Plan of Care Patient      Patient will benefit from skilled therapeutic intervention in order to improve the following deficits and impairments:  Decreased strength, Pain, Decreased range of motion, Abnormal gait  Visit Diagnosis: Acute bilateral low back pain without sciatica  Muscle weakness (generalized)     Problem List Patient Active Problem List   Diagnosis Date Noted  . Chronic right-sided low back pain without sciatica 10/03/2016  . BPH (benign prostatic hyperplasia) 07/01/2016  . Urinary frequency 01/08/2016  . Myasthenia gravis ([place] 10/09/2015  . Other symptoms and signs involving the musculoskeletal system 09/26/2015  . Binocular vision disorder with diplopia 09/26/2015  . Fatigue 09/26/2015  . Upper extremity weakness 09/20/2015  . TIA (transient ischemic attack) 09/13/2015  . CVA (cerebral infarction) 09/13/2015  . Sweating 07/20/2015  . Cold feet 07/20/2015  . Health care maintenance 01/25/2015  . B12 deficiency 11/16/2014  . Obesity (BMI 30-39.9) 09/27/2014  . Anemia 09/27/2014  . Obesity (BMI  30.0-34.9) 05/29/2014  . Hyponatremia 09/20/2013  . Diabetes mellitus ([place] 12/06/2011  . Hypercholesterolemia 08/15/2010  . HYPERTENSION, BENIGN 08/15/2010  . PALPITATIONS, OCCASIONAL 08/15/2010   KAlanson Puls PT, DPT [place] 12/06/2016, 10:36 AM  [place] [facility] [address] [address]Phone: [phone]  Fax:  [phone] Name: HWESTLEE DEVITAMRN: [id number] of Birth: 102/03/1926

## 2016-12-12 ENCOUNTER — Ambulatory Visit: Payer: Medicare Other | Admitting: Physical Therapy

## 2016-12-13 ENCOUNTER — Other Ambulatory Visit (INDEPENDENT_AMBULATORY_CARE_PROVIDER_SITE_OTHER): Payer: Medicare Other

## 2016-12-13 DIAGNOSIS — D649 Anemia, unspecified: Secondary | ICD-10-CM | POA: Diagnosis not present

## 2016-12-13 DIAGNOSIS — E538 Deficiency of other specified B group vitamins: Secondary | ICD-10-CM | POA: Diagnosis not present

## 2016-12-13 DIAGNOSIS — E871 Hypo-osmolality and hyponatremia: Secondary | ICD-10-CM

## 2016-12-13 DIAGNOSIS — L820 Inflamed seborrheic keratosis: Secondary | ICD-10-CM | POA: Diagnosis not present

## 2016-12-13 DIAGNOSIS — L578 Other skin changes due to chronic exposure to nonionizing radiation: Secondary | ICD-10-CM | POA: Diagnosis not present

## 2016-12-13 DIAGNOSIS — L570 Actinic keratosis: Secondary | ICD-10-CM | POA: Diagnosis not present

## 2016-12-13 DIAGNOSIS — L821 Other seborrheic keratosis: Secondary | ICD-10-CM | POA: Diagnosis not present

## 2016-12-13 DIAGNOSIS — Z85828 Personal history of other malignant neoplasm of skin: Secondary | ICD-10-CM | POA: Diagnosis not present

## 2016-12-13 LAB — CBC WITH DIFFERENTIAL/PLATELET
Basophils Absolute: 0.1 10*3/uL (ref 0.0–0.1)
Basophils Relative: 1 % (ref 0.0–3.0)
EOS ABS: 0.1 10*3/uL (ref 0.0–0.7)
Eosinophils Relative: 1.5 % (ref 0.0–5.0)
HCT: 38 % — ABNORMAL LOW (ref 39.0–52.0)
HEMOGLOBIN: 12.6 g/dL — AB (ref 13.0–17.0)
Lymphocytes Relative: 18 % (ref 12.0–46.0)
Lymphs Abs: 1.3 10*3/uL (ref 0.7–4.0)
MCHC: 33 g/dL (ref 30.0–36.0)
MCV: 94.6 fl (ref 78.0–100.0)
MONO ABS: 0.7 10*3/uL (ref 0.1–1.0)
Monocytes Relative: 9.2 % (ref 3.0–12.0)
Neutro Abs: 5 10*3/uL (ref 1.4–7.7)
Neutrophils Relative %: 70.3 % (ref 43.0–77.0)
Platelets: 309 10*3/uL (ref 150.0–400.0)
RBC: 4.02 Mil/uL — AB (ref 4.22–5.81)
RDW: 12.8 % (ref 11.5–15.5)
WBC: 7.1 10*3/uL (ref 4.0–10.5)

## 2016-12-13 LAB — IBC PANEL
IRON: 125 ug/dL (ref 42–165)
Saturation Ratios: 36.9 % (ref 20.0–50.0)
TRANSFERRIN: 242 mg/dL (ref 212.0–360.0)

## 2016-12-13 LAB — VITAMIN B12: VITAMIN B 12: 512 pg/mL (ref 211–911)

## 2016-12-13 LAB — SODIUM: SODIUM: 137 meq/L (ref 135–145)

## 2016-12-13 LAB — FERRITIN: FERRITIN: 129.6 ng/mL (ref 22.0–322.0)

## 2016-12-14 ENCOUNTER — Encounter: Payer: Self-pay | Admitting: Internal Medicine

## 2016-12-17 ENCOUNTER — Encounter: Payer: Medicare Other | Admitting: Physical Therapy

## 2016-12-17 ENCOUNTER — Ambulatory Visit: Payer: Medicare Other | Admitting: Physical Therapy

## 2016-12-17 ENCOUNTER — Encounter: Payer: Self-pay | Admitting: Physical Therapy

## 2016-12-17 DIAGNOSIS — M545 Low back pain, unspecified: Secondary | ICD-10-CM

## 2016-12-17 DIAGNOSIS — M6281 Muscle weakness (generalized): Secondary | ICD-10-CM | POA: Diagnosis not present

## 2016-12-17 NOTE — Therapy (Signed)
[place] MAIN [facility] SERVICES [address] [place], [place], [address] Phone: [phone]   Fax:  [phone]  Physical Therapy Treatment/Progress Note/Recertification  Patient Details  Name: [name] MRN: [id number] Date of Birth: 04/01/1926 Referring Provider: Mable Paris  Encounter Date: 12/17/2016      PT End of Session - 12/17/16 1020    Visit Number 10   Number of Visits 25   Date for PT Re-Evaluation February 26, 2017   Authorization Type g codes 10/10   PT Start Time [id number]   PT Stop Time 1100   PT Time Calculation (min) 45 min   Activity Tolerance Patient tolerated treatment well;Patient limited by pain   Behavior During Therapy [facility] for tasks assessed/performed      Past Medical History:  Diagnosis Date  . Allergic rhinitis   . BPH (benign prostatic hypertrophy)   . Degenerative arthritis   . Diabetes mellitus ([place])   . GERD (gastroesophageal reflux disease)   . HTN (hypertension)   . Hypercholesterolemia   . HYPERTENSION, BENIGN 08/15/2010   Qualifier: Diagnosis of  By: Rockey Situ MD, Tim    . Iron deficiency anemia   . Skin cancer   . TIA (transient ischemic attack) 09/13/2015  . Urinary outflow obstruction    mild    Past Surgical History:  Procedure Laterality Date  . PILONIDAL CYST EXCISION  1951   removal  . [place]  . SKIN CANCER EXCISION     multiple  . SQUAMOUS CELL CARCINOMA EXCISION     behind head  . TONSILLECTOMY AND ADENOIDECTOMY  1938    There were no vitals filed for this visit.      Subjective Assessment - 12/17/16 1018    Subjective Patient reports that his back pain is 5/10 today. He is  getting up and down with less pain and sometimes he has no pain. he still has pain in his low back while he is standing and then he needs to rest it.    Pertinent History Pt reports that he had a right low back muscle strain approximately 6 weeks ago when turning his wife in bed (wife  is bedridden). Pain was initially in the right low back but then he started having some pain on the left side of his back as well. Pt reports that he used capsaicin and lidocaine patches which helped with the pain. Pain has been gradually improving and he is only currently having soreness in his bilateral low back. Approximately 1 year ago he was diagnosed with myasthenia gravis. He went to see [facility] and had immunoglobulin therapy. Reports 85-90% improvement in symptoms since that time. Pt reports that he has also has a "disc problem" in his lower back with pain that runs down his L posterior thigh. He has been seeing a chiropractor for this problem for the last 4-5 years. Denies radicular symptoms related to current episode. Denies N/T or bowel/bladder changes. Pain was initially waking pt from sleep but not currently. Remote history of skin cancer. ROS negative for red flags.    Limitations Standing   How long can you stand comfortably? 15 minutes   Pain Score 5    Pain Location Back   Pain Orientation Left   Pain Descriptors / Indicators Sore   Pain Onset More than a month ago     Therapeutic exercise: Hooklying marching with TA Contraction x 20 x 3 sets hooklying TA contaction with ER/ABD with GTB x  20 x 3 hooklying TA contraction with hip rotation x 20 x 3 Standing trunk extension x 10 x 2 Nu-step x 5 mins with MH to back Knee to chest hip flex stretch x 30 sec  X 3 sets Patient has 4/10 pain following exercises and heat to low back. His gait is no longer antalgic and sit to stand transfers are without spasms and bed mobility is without spasms.                             PT Education - Jan 09, 2017 1019    Education provided Yes   Education Details HEP for core stabilization   Person(s) Educated Patient   Methods Explanation   Comprehension Verbalized understanding             PT Long Term Goals - 01/09/17 1022      PT LONG TERM GOAL #1   Title  Pt will be independent with HEP in order to improve strength and balance in order to decrease fall risk and improve function at home and work.   Time 6   Period Weeks   Status On-going     PT LONG TERM GOAL #2   Title Pt will report worst pain as 6/10 on NPRS in order to demonstrate clinically significant reduction in pain   Baseline 5/10   Period Weeks   Status Achieved     PT LONG TERM GOAL #3   Title Pt will report ability to help turn wife in bed without increase in low back pain   Time 6   Period Weeks   Status Achieved     PT LONG TERM GOAL #4   Title Pt will be able to stand for at least 30 minutes before needing to sit due to back pain   Baseline 15   Time 6   Period Weeks   Status On-going               Plan - 2017-01-09 1023    Clinical Impression Statement Patient was progressed in HEP for core strengthening with no increased pain following therpay. He is having intermittent pain that is improving. He is able to stay in his bed all night now, able to reach and pick something up off the floor, turn his wife in bed without increased symptoms, and perform sit to stand without spasms. He continues to have limited standing time with increased pain.    Clinical Impairments Affecting Rehab Potential positive: improving symptoms, motivation; negative: age   PT Frequency 2x / week   PT Duration 6 weeks   PT Treatment/Interventions Aquatic Therapy;Cryotherapy;Electrical Stimulation;Iontophoresis 4mg /ml Dexamethasone;Moist Heat;Traction;Ultrasound;Gait training;Therapeutic activities;Therapeutic exercise;Balance training;Neuromuscular re-education;Patient/family education;Manual techniques;Passive range of motion;Dry needling   PT Next Visit Plan   pelvic tilts, progres strengthening techniques for lumbar strain   PT Home Exercise Plan Hooklying single knee to chest 30s bilateral x 3, 3 times/day; Hooklying lumbar rotation stretch 30s bilateral x 3, 3 times/day   Consulted and  Agree with Plan of Care Patient      Patient will benefit from skilled therapeutic intervention in order to improve the following deficits and impairments:  Decreased strength, Pain, Decreased range of motion, Abnormal gait  Visit Diagnosis: Acute bilateral low back pain without sciatica - Plan: PT plan of care cert/re-cert  Muscle weakness (generalized) - Plan: PT plan of care cert/re-cert       G-Codes - 01-09-2017 1027    Functional  Assessment Tool Used clinical judgement, NPRS   Functional Limitation Mobility: Walking and moving around   Mobility: Walking and Moving Around Current Status [phone]) At least 40 percent but less than 60 percent impaired, limited or restricted   Mobility: Walking and Moving Around Goal Status [phone]) At least 20 percent but less than 40 percent impaired, limited or restricted      Problem List Patient Active Problem List   Diagnosis Date Noted  . Chronic right-sided low back pain without sciatica 10/03/2016  . BPH (benign prostatic hyperplasia) 07/01/2016  . Urinary frequency 01/08/2016  . Myasthenia gravis ([place]) 10/09/2015  . Other symptoms and signs involving the musculoskeletal system 09/26/2015  . Binocular vision disorder with diplopia 09/26/2015  . Fatigue 09/26/2015  . Upper extremity weakness 09/20/2015  . TIA (transient ischemic attack) 09/13/2015  . CVA (cerebral infarction) 09/13/2015  . Sweating 07/20/2015  . Cold feet 07/20/2015  . Health care maintenance 01/25/2015  . B12 deficiency 11/16/2014  . Obesity (BMI 30-39.9) 09/27/2014  . Anemia 09/27/2014  . Obesity (BMI 30.0-34.9) 05/29/2014  . Hyponatremia 09/20/2013  . Diabetes mellitus ([place]) 12/06/2011  . Hypercholesterolemia 08/15/2010  . HYPERTENSION, BENIGN 08/15/2010  . PALPITATIONS, OCCASIONAL 08/15/2010   Alanson Puls, PT, DPT [place], [place] S 12/17/2016, 10:40 AM  [place] MAIN [facility] SERVICES [address]  [place], [place], [address] Phone: [phone]   Fax:  [phone]  Name: [name] MRN: [id number] Date of Birth: 1926-10-07

## 2016-12-19 ENCOUNTER — Encounter: Payer: Self-pay | Admitting: Physical Therapy

## 2016-12-19 ENCOUNTER — Ambulatory Visit: Payer: Medicare Other | Admitting: Physical Therapy

## 2016-12-19 DIAGNOSIS — M545 Low back pain, unspecified: Secondary | ICD-10-CM

## 2016-12-19 DIAGNOSIS — M6281 Muscle weakness (generalized): Secondary | ICD-10-CM | POA: Diagnosis not present

## 2016-12-19 NOTE — Therapy (Signed)
[place] MAIN [facility] SERVICES [address] [place], [place], [address] Phone: [phone]   Fax:  [phone]  Physical Therapy Treatment  Patient Details  Name: [name] MRN: [id number] Date of Birth: 15-Dec-1925 Referring Provider: Mable Paris  Encounter Date: 12/19/2016      PT End of Session - 12/19/16 1004    Visit Number 10   Number of Visits 25   Date for PT Re-Evaluation 17-Feb-2017   Authorization Type g codes 10/10   PT Start Time 1000   PT Stop Time 1045   PT Time Calculation (min) 45 min   Activity Tolerance Patient tolerated treatment well;Patient limited by pain   Behavior During Therapy [facility] for tasks assessed/performed      Past Medical History:  Diagnosis Date  . Allergic rhinitis   . BPH (benign prostatic hypertrophy)   . Degenerative arthritis   . Diabetes mellitus ([place])   . GERD (gastroesophageal reflux disease)   . HTN (hypertension)   . Hypercholesterolemia   . HYPERTENSION, BENIGN 08/15/2010   Qualifier: Diagnosis of  By: Rockey Situ MD, Tim    . Iron deficiency anemia   . Skin cancer   . TIA (transient ischemic attack) 09/13/2015  . Urinary outflow obstruction    mild    Past Surgical History:  Procedure Laterality Date  . PILONIDAL CYST EXCISION  1951   removal  . [place]  . SKIN CANCER EXCISION     multiple  . SQUAMOUS CELL CARCINOMA EXCISION     behind head  . TONSILLECTOMY AND ADENOIDECTOMY  1938    There were no vitals filed for this visit.      Subjective Assessment - 12/19/16 1003    Subjective Patient reports that his back pain is 5/10 today. He is  getting up and down with less pain and sometimes he has no pain. he still has pain in his low back while he is standing and then he needs to rest it.    Pertinent History Pt reports that he had a right low back muscle strain approximately 6 weeks ago when turning his wife in bed (wife is bedridden). Pain was  initially in the right low back but then he started having some pain on the left side of his back as well. Pt reports that he used capsaicin and lidocaine patches which helped with the pain. Pain has been gradually improving and he is only currently having soreness in his bilateral low back. Approximately 1 year ago he was diagnosed with myasthenia gravis. He went to see [facility] neurology and had immunoglobulin therapy. Reports 85-90% improvement in symptoms since that time. Pt reports that he has also has a "disc problem" in his lower back with pain that runs down his L posterior thigh. He has been seeing a chiropractor for this problem for the last 4-5 years. Denies radicular symptoms related to current episode. Denies N/T or bowel/bladder changes. Pain was initially waking pt from sleep but not currently. Remote history of skin cancer. ROS negative for red flags.    Limitations Standing   How long can you stand comfortably? 15 minutes   Currently in Pain? Yes   Pain Score 5    Pain Location Back   Pain Orientation Left   Pain Descriptors / Indicators Aching   Pain Type Chronic pain   Pain Onset More than a month ago   Pain Frequency Constant   Multiple Pain Sites No  Therapeutic exercise: Hooklying marching with TA Contraction x 20 x 3 sets hooklying TA contaction with ER/ABD with GTB x 20 x 3 hooklying TA contraction with hip rotation x 20 x 3 Standing trunk extension x 10 x 2 Nu-step x 5 mins with MH to back Knee to chest hip flex stretch x 30 sec  X 3 sets  Manual therapy : STM to left lower back and left SI joint musculature, PA glides to left SI x 30 bouts x 3 20 minutes with MH Reviewed body mechanics for getting in and out of bed;  sit to stand; and for standing wiht one LE up on a stool Patient presents with 5/10 pain to left side low back and pain decreases to 4 /10 following treatment. His gait is no longer antalgic and sit to stand transfers are without spasms and bed  mobility is without spasms.                            PT Education - 12/19/16 1004    Education provided Yes   Education Details HEP   Person(s) Educated Patient   Methods Explanation   Comprehension Verbalized understanding             PT Long Term Goals - 12/17/16 1022      PT LONG TERM GOAL #1   Title Pt will be independent with HEP in order to improve strength and balance in order to decrease fall risk and improve function at home and work.   Time 6   Period Weeks   Status On-going     PT LONG TERM GOAL #2   Title Pt will report worst pain as 6/10 on NPRS in order to demonstrate clinically significant reduction in pain   Baseline 5/10   Period Weeks   Status Achieved     PT LONG TERM GOAL #3   Title Pt will report ability to help turn wife in bed without increase in low back pain   Time 6   Period Weeks   Status Achieved     PT LONG TERM GOAL #4   Title Pt will be able to stand for at least 30 minutes before needing to sit due to back pain   Baseline 15   Time 6   Period Weeks   Status On-going               Plan - 12/19/16 1005    Clinical Impression Statement Patient was instructed in core strengthening exercises with no increased pain following exercises. He responded to manual therapy including PA glides to L1- L5 and STM to left low back/SI musculature. He will continue to benefit from skilled PT to improve mobility and decrease pain.    Clinical Impairments Affecting Rehab Potential positive: improving symptoms, motivation; negative: age   PT Frequency 2x / week   PT Duration 6 weeks   PT Treatment/Interventions Aquatic Therapy;Cryotherapy;Electrical Stimulation;Iontophoresis 4mg /ml Dexamethasone;Moist Heat;Traction;Ultrasound;Gait training;Therapeutic activities;Therapeutic exercise;Balance training;Neuromuscular re-education;Patient/family education;Manual techniques;Passive range of motion;Dry needling   PT Next Visit Plan    pelvic tilts, progres strengthening techniques for lumbar strain   PT Home Exercise Plan Hooklying single knee to chest 30s bilateral x 3, 3 times/day; Hooklying lumbar rotation stretch 30s bilateral x 3, 3 times/day   Consulted and Agree with Plan of Care Patient      Patient will benefit from skilled therapeutic intervention in order to improve the following deficits and impairments:  Decreased strength, Pain, Decreased range of motion, Abnormal gait  Visit Diagnosis: Acute bilateral low back pain without sciatica  Muscle weakness (generalized)     Problem List Patient Active Problem List   Diagnosis Date Noted  . Chronic right-sided low back pain without sciatica 10/03/2016  . BPH (benign prostatic hyperplasia) 07/01/2016  . Urinary frequency 01/08/2016  . Myasthenia gravis ([place]) 10/09/2015  . Other symptoms and signs involving the musculoskeletal system 09/26/2015  . Binocular vision disorder with diplopia 09/26/2015  . Fatigue 09/26/2015  . Upper extremity weakness 09/20/2015  . TIA (transient ischemic attack) 09/13/2015  . CVA (cerebral infarction) 09/13/2015  . Sweating 07/20/2015  . Cold feet 07/20/2015  . Health care maintenance 01/25/2015  . B12 deficiency 11/16/2014  . Obesity (BMI 30-39.9) 09/27/2014  . Anemia 09/27/2014  . Obesity (BMI 30.0-34.9) 05/29/2014  . Hyponatremia 09/20/2013  . Diabetes mellitus ([place]) 12/06/2011  . Hypercholesterolemia 08/15/2010  . HYPERTENSION, BENIGN 08/15/2010  . PALPITATIONS, OCCASIONAL 08/15/2010   Alanson Puls, PT, DPT [place], [place] S 12/19/2016, 10:38 AM  [place] MAIN [facility] SERVICES [address] [place], [place], [address] Phone: [phone]   Fax:  [phone]  Name: [name] MRN: [id number] Date of Birth: [birth year]

## 2016-12-20 DIAGNOSIS — G7000 Myasthenia gravis without (acute) exacerbation: Secondary | ICD-10-CM | POA: Diagnosis not present

## 2016-12-25 ENCOUNTER — Encounter: Payer: Medicare Other | Admitting: Physical Therapy

## 2016-12-26 ENCOUNTER — Ambulatory Visit: Payer: Medicare Other | Admitting: Physical Therapy

## 2016-12-26 ENCOUNTER — Encounter: Payer: Self-pay | Admitting: Physical Therapy

## 2016-12-26 DIAGNOSIS — M545 Low back pain, unspecified: Secondary | ICD-10-CM

## 2016-12-26 DIAGNOSIS — M6281 Muscle weakness (generalized): Secondary | ICD-10-CM

## 2016-12-26 NOTE — Therapy (Signed)
[place] MAIN [facility] SERVICES [address] [place], [place], [address] Phone: [phone]   Fax:  [phone]  Physical Therapy Treatment  Patient Details  Name: [name] MRN: [id number] Date of Birth: Apr 23, 1926 Referring Provider: Mable Paris  Encounter Date: 12/26/2016      PT End of Session - 12/26/16 1029    Visit Number 11   Number of Visits 25   Date for PT Re-Evaluation Jan 29, 2017   Authorization Type g codes 1/10   PT Start Time 1020   PT Stop Time 1100   PT Time Calculation (min) 40 min   Activity Tolerance Patient tolerated treatment well;Patient limited by pain   Behavior During Therapy [facility] for tasks assessed/performed      Past Medical History:  Diagnosis Date  . Allergic rhinitis   . BPH (benign prostatic hypertrophy)   . Degenerative arthritis   . Diabetes mellitus ([place])   . GERD (gastroesophageal reflux disease)   . HTN (hypertension)   . Hypercholesterolemia   . HYPERTENSION, BENIGN 08/15/2010   Qualifier: Diagnosis of  By: Rockey Situ MD, Tim    . Iron deficiency anemia   . Skin cancer   . TIA (transient ischemic attack) 09/13/2015  . Urinary outflow obstruction    mild    Past Surgical History:  Procedure Laterality Date  . PILONIDAL CYST EXCISION  1951   removal  . [place]  . SKIN CANCER EXCISION     multiple  . SQUAMOUS CELL CARCINOMA EXCISION     behind head  . TONSILLECTOMY AND ADENOIDECTOMY  1938    There were no vitals filed for this visit.      Subjective Assessment - 12/26/16 1027    Subjective Patient reports that his back pain is 10/10 today. He cant think of anything in particular that he did that might have brought this on today.    Pertinent History Pt reports that he had a right low back muscle strain approximately 6 weeks ago when turning his wife in bed (wife is bedridden). Pain was initially in the right low back but then he started having some pain on  the left side of his back as well. Pt reports that he used capsaicin and lidocaine patches which helped with the pain. Pain has been gradually improving and he is only currently having soreness in his bilateral low back. Approximately 1 year ago he was diagnosed with myasthenia gravis. He went to see [facility] neurology and had immunoglobulin therapy. Reports 85-90% improvement in symptoms since that time. Pt reports that he has also has a "disc problem" in his lower back with pain that runs down his L posterior thigh. He has been seeing a chiropractor for this problem for the last 4-5 years. Denies radicular symptoms related to current episode. Denies N/T or bowel/bladder changes. Pain was initially waking pt from sleep but not currently. Remote history of skin cancer. ROS negative for red flags.    Limitations Standing   How long can you stand comfortably? 15 minutes   Currently in Pain? Yes   Pain Score 10-Worst pain ever   Pain Location Back   Pain Orientation Left   Pain Descriptors / Indicators Sharp   Pain Type Chronic pain   Pain Onset More than a month ago   Pain Frequency Constant   Multiple Pain Sites No         Therapeutic exercise: Hooklying marching with TA Contraction x 10 hooklying TA  contaction with ER/ABD with GTB x 10 hooklying TA contraction with hip rotation x 10  Manual therpay: STM to left sided paraspinal muscles x 10 mins Roller stick x 10 minutes PA glides , grade 4 L1- L5 x 30 bouts x 3 sets  Patient performed exercises with MH and had 10/10 prior to treatment and 7/10 following treatment.                         PT Education - 12/26/16 1029    Education provided Yes   Education Details heat pad for pain control   Person(s) Educated Patient   Methods Explanation   Comprehension Verbalized understanding             PT Long Term Goals - 12/17/16 1022      PT LONG TERM GOAL #1   Title Pt will be independent with HEP in order to  improve strength and balance in order to decrease fall risk and improve function at home and work.   Time 6   Period Weeks   Status On-going     PT LONG TERM GOAL #2   Title Pt will report worst pain as 6/10 on NPRS in order to demonstrate clinically significant reduction in pain   Baseline 5/10   Period Weeks   Status Achieved     PT LONG TERM GOAL #3   Title Pt will report ability to help turn wife in bed without increase in low back pain   Time 6   Period Weeks   Status Achieved     PT LONG TERM GOAL #4   Title Pt will be able to stand for at least 30 minutes before needing to sit due to back pain   Baseline 15   Time 6   Period Weeks   Status On-going               Plan - 12/26/16 1030    Clinical Impression Statement Patient has 10/10 pain in left side of low back and he responds to manual therapy with decreasing pain after STM to left paraspinal muscles and PA glides grade 3 to L1- L5 , He has antalgic gait and difficulty with sit to stand mobiltiy with back spasms prior to treatment and no spasms following treatment. He was educted to continue the heat and lombar stability HEP . He will continue to benefit from skilled PT to improve moblity and decrese his back pain    Rehab Potential Good   Clinical Impairments Affecting Rehab Potential positive: improving symptoms, motivation; negative: age   PT Frequency 2x / week   PT Duration 6 weeks   PT Treatment/Interventions Aquatic Therapy;Cryotherapy;Electrical Stimulation;Iontophoresis 4mg /ml Dexamethasone;Moist Heat;Traction;Ultrasound;Gait training;Therapeutic activities;Therapeutic exercise;Balance training;Neuromuscular re-education;Patient/family education;Manual techniques;Passive range of motion;Dry needling   PT Next Visit Plan   pelvic tilts, progres strengthening techniques for lumbar strain   PT Home Exercise Plan Hooklying single knee to chest 30s bilateral x 3, 3 times/day; Hooklying lumbar rotation stretch 30s  bilateral x 3, 3 times/day   Consulted and Agree with Plan of Care Patient      Patient will benefit from skilled therapeutic intervention in order to improve the following deficits and impairments:  Decreased strength, Pain, Decreased range of motion, Abnormal gait  Visit Diagnosis: Acute bilateral low back pain without sciatica  Muscle weakness (generalized)     Problem List Patient Active Problem List   Diagnosis Date Noted  . Chronic right-sided low back  pain without sciatica 10/03/2016  . BPH (benign prostatic hyperplasia) 07/01/2016  . Urinary frequency 01/08/2016  . Myasthenia gravis ([place]) 10/09/2015  . Other symptoms and signs involving the musculoskeletal system 09/26/2015  . Binocular vision disorder with diplopia 09/26/2015  . Fatigue 09/26/2015  . Upper extremity weakness 09/20/2015  . TIA (transient ischemic attack) 09/13/2015  . CVA (cerebral infarction) 09/13/2015  . Sweating 07/20/2015  . Cold feet 07/20/2015  . Health care maintenance 01/25/2015  . B12 deficiency 11/16/2014  . Obesity (BMI 30-39.9) 09/27/2014  . Anemia 09/27/2014  . Obesity (BMI 30.0-34.9) 05/29/2014  . Hyponatremia 09/20/2013  . Diabetes mellitus ([place]) 12/06/2011  . Hypercholesterolemia 08/15/2010  . HYPERTENSION, BENIGN 08/15/2010  . PALPITATIONS, OCCASIONAL 08/15/2010   Alanson Puls, PT, DPT [place], Minette Headland S 12/26/2016, 10:57 AM  [place] MAIN [facility] SERVICES [address] [place], [place], [address] Phone: [phone]   Fax:  [phone]  Name: [name] MRN: [id number] Date of Birth: [birth year]

## 2016-12-27 ENCOUNTER — Encounter: Payer: Self-pay | Admitting: Physical Therapy

## 2016-12-27 ENCOUNTER — Ambulatory Visit: Payer: Medicare Other | Admitting: Physical Therapy

## 2016-12-27 DIAGNOSIS — M545 Low back pain, unspecified: Secondary | ICD-10-CM

## 2016-12-27 DIAGNOSIS — M6281 Muscle weakness (generalized): Secondary | ICD-10-CM | POA: Diagnosis not present

## 2016-12-27 NOTE — Therapy (Signed)
[place] MAIN [facility] SERVICES [address] [place], [place], [address] Phone: [phone]   Fax:  [phone]  Physical Therapy Treatment  Patient Details  Name: [name] MRN: [id number] Date of Birth: 1926/03/02 Referring Provider: Mable Paris  Encounter Date: 12/27/2016      PT End of Session - 12/27/16 1054    Visit Number 12   Number of Visits 25   Date for PT Re-Evaluation 2017/02/18   Authorization Type g codes 2023/01/05   PT Start Time 1100   PT Stop Time 1130   PT Time Calculation (min) 30 min   Activity Tolerance Patient tolerated treatment well;Patient limited by pain   Behavior During Therapy [facility] for tasks assessed/performed      Past Medical History:  Diagnosis Date  . Allergic rhinitis   . BPH (benign prostatic hypertrophy)   . Degenerative arthritis   . Diabetes mellitus ([place])   . GERD (gastroesophageal reflux disease)   . HTN (hypertension)   . Hypercholesterolemia   . HYPERTENSION, BENIGN 08/15/2010   Qualifier: Diagnosis of  By: Rockey Situ MD, Tim    . Iron deficiency anemia   . Skin cancer   . TIA (transient ischemic attack) 09/13/2015  . Urinary outflow obstruction    mild    Past Surgical History:  Procedure Laterality Date  . PILONIDAL CYST EXCISION  1951   removal  . [place]  . SKIN CANCER EXCISION     multiple  . SQUAMOUS CELL CARCINOMA EXCISION     behind head  . TONSILLECTOMY AND ADENOIDECTOMY  1938    There were no vitals filed for this visit.      Subjective Assessment - 12/27/16 1138    Subjective Patient states his pain today is 8/10 and is better than yesterday. He believes therapy yesterday helped decrease his pain.    Pertinent History Pt reports that he had a right low back muscle strain approximately 6 weeks ago when turning his wife in bed (wife is bedridden). Pain was initially in the right low back but then he started having some pain on the left side of  his back as well. Pt reports that he used capsaicin and lidocaine patches which helped with the pain. Pain has been gradually improving and he is only currently having soreness in his bilateral low back. Approximately 1 year ago he was diagnosed with myasthenia gravis. He went to see [place] neurology and had immunoglobulin therapy. Reports 85-90% improvement in symptoms since that time. Pt reports that he has also has a "disc problem" in his lower back with pain that runs down his L posterior thigh. He has been seeing a chiropractor for this problem for the last 4-5 years. Denies radicular symptoms related to current episode. Denies N/T or bowel/bladder changes. Pain was initially waking pt from sleep but not currently. Remote history of skin cancer. ROS negative for red flags.    Limitations Standing   How long can you stand comfortably? 15 minutes   Currently in Pain? Yes   Pain Score 8    Pain Location Back   Pain Orientation Left   Pain Descriptors / Indicators Sharp   Pain Type Chronic pain   Pain Onset More than a month ago   Pain Frequency Constant   Multiple Pain Sites No        Treatment: Manual therapy: STM to L low back and L paraspinals x71min  Therapeutic exercise:  Hooklying marching with  TA contraction x25 BLE Hookyling abd/ER with TA contraction with green theraband x25 BLE   Patient tolerated STM well with decrease in pain from 8/10 to 6/10. Patient tolerated therapeutic exercise well with no increase in pain. Patient required min verbal cueing to perform abd/ER slowly to maintain correct position.          PT Education - 12/27/16 1053    Education provided Yes   Education Details HEP   Person(s) Educated Patient   Methods Explanation   Comprehension Verbalized understanding             PT Long Term Goals - 12/17/16 1022      PT LONG TERM GOAL #1   Title Pt will be independent with HEP in order to improve strength and balance in order to decrease fall  risk and improve function at home and work.   Time 6   Period Weeks   Status On-going     PT LONG TERM GOAL #2   Title Pt will report worst pain as 6/10 on NPRS in order to demonstrate clinically significant reduction in pain   Baseline 5/10   Period Weeks   Status Achieved     PT LONG TERM GOAL #3   Title Pt will report ability to help turn wife in bed without increase in low back pain   Time 6   Period Weeks   Status Achieved     PT LONG TERM GOAL #4   Title Pt will be able to stand for at least 30 minutes before needing to sit due to back pain   Baseline 15   Time 6   Period Weeks   Status On-going               Plan - 12/27/16 1140    Clinical Impression Statement Patient complained of 8/10 pain in his L low back and responded well to STM to his paraspinals and L low back. Patient states his pain after therapy session is 6/10. Patient will continue to benefit from skilled PT in order to decrease low back pain and improve mobility.   Rehab Potential Good   Clinical Impairments Affecting Rehab Potential positive: improving symptoms, motivation; negative: age   PT Frequency 2x / week   PT Duration 6 weeks   PT Treatment/Interventions Aquatic Therapy;Cryotherapy;Electrical Stimulation;Iontophoresis 4mg /ml Dexamethasone;Moist Heat;Traction;Ultrasound;Gait training;Therapeutic activities;Therapeutic exercise;Balance training;Neuromuscular re-education;Patient/family education;Manual techniques;Passive range of motion;Dry needling   PT Next Visit Plan   pelvic tilts, progres strengthening techniques for lumbar strain   PT Home Exercise Plan Hooklying single knee to chest 30s bilateral x 3, 3 times/day; Hooklying lumbar rotation stretch 30s bilateral x 3, 3 times/day   Consulted and Agree with Plan of Care Patient      Patient will benefit from skilled therapeutic intervention in order to improve the following deficits and impairments:  Decreased strength, Pain, Decreased  range of motion, Abnormal gait  Visit Diagnosis: Acute bilateral low back pain without sciatica  Muscle weakness (generalized)     Problem List Patient Active Problem List   Diagnosis Date Noted  . Chronic right-sided low back pain without sciatica 10/03/2016  . BPH (benign prostatic hyperplasia) 07/01/2016  . Urinary frequency 01/08/2016  . Myasthenia gravis ([place]) 10/09/2015  . Other symptoms and signs involving the musculoskeletal system 09/26/2015  . Binocular vision disorder with diplopia 09/26/2015  . Fatigue 09/26/2015  . Upper extremity weakness 09/20/2015  . TIA (transient ischemic attack) 09/13/2015  . CVA (cerebral infarction) 09/13/2015  .  Sweating 07/20/2015  . Cold feet 07/20/2015  . Health care maintenance 01/25/2015  . B12 deficiency 11/16/2014  . Obesity (BMI 30-39.9) 09/27/2014  . Anemia 09/27/2014  . Obesity (BMI 30.0-34.9) 05/29/2014  . Hyponatremia 09/20/2013  . Diabetes mellitus ([place]) 12/06/2011  . Hypercholesterolemia 08/15/2010  . HYPERTENSION, BENIGN 08/15/2010  . PALPITATIONS, OCCASIONAL 08/15/2010  This entire session was performed under direct supervision and direction of a licensed therapist/therapist assistant . I have personally read, edited and approve of the note as written. Betsy Coder, SPT Betsy Coder 12/27/2016, 11:41 AM  Alanson Puls, PT, DPT [place] MAIN [facility] SERVICES [address] [place], [place], [address] Phone: [phone]   Fax:  [phone]  Name: MAYSIN MOEHRING MRN: [id number] Date of Birth: September 22, 1926

## 2016-12-28 DIAGNOSIS — M5136 Other intervertebral disc degeneration, lumbar region: Secondary | ICD-10-CM | POA: Diagnosis not present

## 2016-12-28 DIAGNOSIS — M955 Acquired deformity of pelvis: Secondary | ICD-10-CM | POA: Diagnosis not present

## 2016-12-28 DIAGNOSIS — M9905 Segmental and somatic dysfunction of pelvic region: Secondary | ICD-10-CM | POA: Diagnosis not present

## 2016-12-28 DIAGNOSIS — M9903 Segmental and somatic dysfunction of lumbar region: Secondary | ICD-10-CM | POA: Diagnosis not present

## 2017-01-01 ENCOUNTER — Encounter: Payer: Self-pay | Admitting: Physical Therapy

## 2017-01-01 ENCOUNTER — Ambulatory Visit: Payer: Medicare Other | Admitting: Physical Therapy

## 2017-01-01 DIAGNOSIS — M545 Low back pain, unspecified: Secondary | ICD-10-CM

## 2017-01-01 DIAGNOSIS — M6281 Muscle weakness (generalized): Secondary | ICD-10-CM

## 2017-01-01 NOTE — Therapy (Signed)
[place] [facility] SERVICES [address] [place], [place], [address] Phone: [phone]   Fax:  [phone]  Physical Therapy Treatment  Patient Details  Name: [name] MRN: [id number] Date of Birth: Aug 16, 1926 Referring Provider: Mable Paris  Encounter Date: 01/01/2017      PT End of Session - 01/01/17 1011    Visit Number 13   Number of Visits 25   Date for PT Re-Evaluation 01/30/2017   Authorization Type g codes 2023/01/15   PT Start Time 1000   PT Stop Time 1045   PT Time Calculation (min) 45 min   Activity Tolerance Patient tolerated treatment well;Patient limited by pain   Behavior During Therapy [facility] for tasks assessed/performed      Past Medical History:  Diagnosis Date  . Allergic rhinitis   . BPH (benign prostatic hypertrophy)   . Degenerative arthritis   . Diabetes mellitus ([place])   . GERD (gastroesophageal reflux disease)   . HTN (hypertension)   . Hypercholesterolemia   . HYPERTENSION, BENIGN 08/15/2010   Qualifier: Diagnosis of  By: Rockey Situ MD, Tim    . Iron deficiency anemia   . Skin cancer   . TIA (transient ischemic attack) 09/13/2015  . Urinary outflow obstruction    mild    Past Surgical History:  Procedure Laterality Date  . PILONIDAL CYST EXCISION  1951   removal  . [place]  . SKIN CANCER EXCISION     multiple  . SQUAMOUS CELL CARCINOMA EXCISION     behind head  . TONSILLECTOMY AND ADENOIDECTOMY  1938    There were no vitals filed for this visit.      Subjective Assessment - 01/01/17 1009    Subjective Patient states his pain today is 9/10 and is better than yesterday. He says that when he sits he is not in pain and when he stands back up , the pain starts after 15 minutes.    Pertinent History Pt reports that he had a right low back muscle strain approximately 6 weeks ago when turning his wife in bed (wife is bedridden). Pain was initially in the right low back but  then he started having some pain on the left side of his back as well. Pt reports that he used capsaicin and lidocaine patches which helped with the pain. Pain has been gradually improving and he is only currently having soreness in his bilateral low back. Approximately 1 year ago he was diagnosed with myasthenia gravis. He went to see [place] neurology and had immunoglobulin therapy. Reports 85-90% improvement in symptoms since that time. Pt reports that he has also has a "disc problem" in his lower back with pain that runs down his L posterior thigh. He has been seeing a chiropractor for this problem for the last 4-5 years. Denies radicular symptoms related to current episode. Denies N/T or bowel/bladder changes. Pain was initially waking pt from sleep but not currently. Remote history of skin cancer. ROS negative for red flags.    Limitations Standing   How long can you stand comfortably? 15 minutes   Currently in Pain? Yes   Pain Score 8    Pain Location Back   Pain Orientation Left   Pain Descriptors / Indicators Sharp   Pain Type Chronic pain   Pain Onset More than a month ago   Pain Frequency Intermittent   Aggravating Factors  standing   Pain Relieving Factors sitting   Multiple Pain  Sites No      Therapeutic exercise: 4 sets of prone extension x 10 repetitions 3 sets of standing repeated extensions x 10 reps hooklying ABD/ER with GTB x 20 x 2 with TA activation  Manual therapy: Long axis distraction LLE x 8 bouts of 3 minutes grade   Instructed in correct sitting position with a towel roll in lumbar spine for support.   Patient has 9/10 pain prior to therapy and 7/10 following prone extension and manual therapy.                           PT Education - 01/01/17 1011    Education provided Yes   Education Details HEP   Person(s) Educated Patient   Methods Explanation   Comprehension Verbalized understanding             PT Long Term Goals - 12/17/16  1022      PT LONG TERM GOAL #1   Title Pt will be independent with HEP in order to improve strength and balance in order to decrease fall risk and improve function at home and work.   Time 6   Period Weeks   Status On-going     PT LONG TERM GOAL #2   Title Pt will report worst pain as 6/10 on NPRS in order to demonstrate clinically significant reduction in pain   Baseline 5/10   Period Weeks   Status Achieved     PT LONG TERM GOAL #3   Title Pt will report ability to help turn wife in bed without increase in low back pain   Time 6   Period Weeks   Status Achieved     PT LONG TERM GOAL #4   Title Pt will be able to stand for at least 30 minutes before needing to sit due to back pain   Baseline 15   Time 6   Period Weeks   Status On-going               Plan - 01/01/17 1012    Clinical Impression Statement Patient reports 9/10 pain in his left lower back that gets better with sitting and worse with standing. He has not been doing his prone extension exercises and this was reviewed today and patient was educated in doing them every hours for 10 repetitions. He was instructed in prone extension exercises and standing extension exercises with 10 reps. He pulls his bedridden wife up in bed and takes care of her which is causing constant  repeat of poor posture during the day. He is going to try and be more dilligent iwth HEP and see if his pain decreases.    Rehab Potential Good   Clinical Impairments Affecting Rehab Potential positive: improving symptoms, motivation; negative: age   PT Frequency 2x / week   PT Duration 6 weeks   PT Treatment/Interventions Aquatic Therapy;Cryotherapy;Electrical Stimulation;Iontophoresis 4mg /ml Dexamethasone;Moist Heat;Traction;Ultrasound;Gait training;Therapeutic activities;Therapeutic exercise;Balance training;Neuromuscular re-education;Patient/family education;Manual techniques;Passive range of motion;Dry needling   PT Next Visit Plan   pelvic  tilts, progres strengthening techniques for lumbar strain   PT Home Exercise Plan Hooklying single knee to chest 30s bilateral x 3, 3 times/day; Hooklying lumbar rotation stretch 30s bilateral x 3, 3 times/day   Consulted and Agree with Plan of Care Patient      Patient will benefit from skilled therapeutic intervention in order to improve the following deficits and impairments:  Decreased strength, Pain, Decreased range of motion,  Abnormal gait  Visit Diagnosis: Acute bilateral low back pain without sciatica  Muscle weakness (generalized)     Problem List Patient Active Problem List   Diagnosis Date Noted  . Chronic right-sided low back pain without sciatica 10/03/2016  . BPH (benign prostatic hyperplasia) 07/01/2016  . Urinary frequency 01/08/2016  . Myasthenia gravis ([place]) 10/09/2015  . Other symptoms and signs involving the musculoskeletal system 09/26/2015  . Binocular vision disorder with diplopia 09/26/2015  . Fatigue 09/26/2015  . Upper extremity weakness 09/20/2015  . TIA (transient ischemic attack) 09/13/2015  . CVA (cerebral infarction) 09/13/2015  . Sweating 07/20/2015  . Cold feet 07/20/2015  . Health care maintenance 01/25/2015  . B12 deficiency 11/16/2014  . Obesity (BMI 30-39.9) 09/27/2014  . Anemia 09/27/2014  . Obesity (BMI 30.0-34.9) 05/29/2014  . Hyponatremia 09/20/2013  . Diabetes mellitus ([place]) 12/06/2011  . Hypercholesterolemia 08/15/2010  . HYPERTENSION, BENIGN 08/15/2010  . PALPITATIONS, OCCASIONAL 08/15/2010   Alanson Puls, PT, DPT [place], Minette [place] S 01/01/2017, 10:41 AM  [place] [facility] SERVICES [address] [place], [place], [address] Phone: [phone]   Fax:  [phone]  Name: [name] MRN: [id number] Date of Birth: 1926-03-31

## 2017-01-03 ENCOUNTER — Ambulatory Visit: Payer: Medicare Other | Attending: Family

## 2017-01-03 DIAGNOSIS — M545 Low back pain, unspecified: Secondary | ICD-10-CM

## 2017-01-03 DIAGNOSIS — M6281 Muscle weakness (generalized): Secondary | ICD-10-CM | POA: Insufficient documentation

## 2017-01-03 NOTE — Therapy (Signed)
[place] MAIN [facility] SERVICES [address] [place], [place], [address] Phone: [phone]   Fax:  [phone]  Physical Therapy Treatment  Patient Details  Name: [name] MRN: [id number] Date of Birth: October 31, 1926 Referring Provider: Mable Paris  Encounter Date: 01/03/2017      PT End of Session - 01/03/17 1413    Visit Number 14   Number of Visits 25   Date for PT Re-Evaluation 02-17-2017   Authorization Type g codes 03/05/2023   PT Start Time [id number]   PT Stop Time 1100   PT Time Calculation (min) 45 min   Activity Tolerance Patient tolerated treatment well;Patient limited by pain   Behavior During Therapy [facility] for tasks assessed/performed      Past Medical History:  Diagnosis Date  . Allergic rhinitis   . BPH (benign prostatic hypertrophy)   . Degenerative arthritis   . Diabetes mellitus ([place])   . GERD (gastroesophageal reflux disease)   . HTN (hypertension)   . Hypercholesterolemia   . HYPERTENSION, BENIGN 08/15/2010   Qualifier: Diagnosis of  By: Rockey Situ MD, Tim    . Iron deficiency anemia   . Skin cancer   . TIA (transient ischemic attack) 09/13/2015  . Urinary outflow obstruction    mild    Past Surgical History:  Procedure Laterality Date  . PILONIDAL CYST EXCISION  1951   removal  . [place]  . SKIN CANCER EXCISION     multiple  . SQUAMOUS CELL CARCINOMA EXCISION     behind head  . TONSILLECTOMY AND ADENOIDECTOMY  1938    There were no vitals filed for this visit.      Subjective Assessment - 01/03/17 1358    Subjective Patient reports increased back pain today and states it hurts more when he stands and walks. 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    Pertinent History Pt reports that he had a right low back muscle strain approximately 6 weeks ago when turning his wife in bed (wife is bedridden). Pain was initially in the  right low back but then he started having some pain on the left side of his back as well. Pt reports that he used capsaicin and lidocaine patches which helped with the pain. Pain has been gradually improving and he is only currently having soreness in his bilateral low back. Approximately 1 year ago he was diagnosed with myasthenia gravis. He went to see [place] neurology and had immunoglobulin therapy. Reports 85-90% improvement in symptoms since that time. Pt reports that he has also has a "disc problem" in his lower back with pain that runs down his L posterior thigh. He has been seeing a chiropractor for this problem for the last 4-5 years. Denies radicular symptoms related to current episode. Denies N/T or bowel/bladder changes. Pain was initially waking pt from sleep but not currently. Remote history of skin cancer. ROS negative for red flags.    Limitations Standing   How long can you stand comfortably? 15 minutes   Currently in Pain? Yes   Pain Score 9    Pain Location Back   Pain Orientation Left   Pain Descriptors / Indicators Aching;Sharp   Pain Type Chronic pain   Pain Onset More than a month ago   Pain Frequency Intermittent      Therapeutic exercise: 3 sets of prone extension --  x 10 repetitions 3  sets of standing repeated extensions --  x 10 reps Backwards walking - x 3 down and back in  bar Closed kinetic chain lumbar/hip extension in  bars - x 75min Wobbleboard lateral weight shifting to improve weight shifting bilaterally - x20  Knees to chest with physioball - 2 min LTR with physioball - 2 min Prone hip extension - 2 x 10 B Standing hip extension -  x 10 with cueing on glute activation   Manual therapy: Long axis distraction LLE x 2 bouts of 1 minutes grade. Performed STM to superior gluteal musculature with patient positioned in prone to decrease muscular tightening and spasms. Passively performed hip extension in prone to improve hip extension AROM      PT  Education - 01/03/17 1412    Education provided Yes   Education Details Form/technique with exercise   Person(s) Educated Patient   Methods Explanation;Demonstration   Comprehension Verbalized understanding;Returned demonstration             PT Long Term Goals - 12/17/16 1022      PT LONG TERM GOAL #1   Title Pt will be independent with HEP in order to improve strength and balance in order to decrease fall risk and improve function at home and work.   Time 6   Period Weeks   Status On-going     PT LONG TERM GOAL #2   Title Pt will report worst pain as 6/10 on NPRS in order to demonstrate clinically significant reduction in pain   Baseline 5/10   Period Weeks   Status Achieved     PT LONG TERM GOAL #3   Title Pt will report ability to help turn wife in bed without increase in low back pain   Time 6   Period Weeks   Status Achieved     PT LONG TERM GOAL #4   Title Pt will be able to stand for at least 30 minutes before needing to sit due to back pain   Baseline 15   Time 6   Period Weeks   Status On-going               Plan - 01/03/17 1414    Clinical Impression Statement Patient reports minimal improvement in low back pain after performing perfusion/mobility exercises and manual therapy indicating decreased muscular spasms. Patient demonstrates increased pain after performing exercises in standing and patient will benefit from further extension and mobility based exercises to decrease muscular spasms and return pt to prior level of functionn.    Rehab Potential Good   Clinical Impairments Affecting Rehab Potential positive: improving symptoms, motivation; negative: age   PT Frequency 2x / week   PT Duration 6 weeks   PT Treatment/Interventions Aquatic Therapy;Cryotherapy;Electrical Stimulation;Iontophoresis 4mg /ml Dexamethasone;Moist Heat;Traction;Ultrasound;Gait training;Therapeutic activities;Therapeutic exercise;Balance training;Neuromuscular  re-education;Patient/family education;Manual techniques;Passive range of motion;Dry needling   PT Next Visit Plan   pelvic tilts, progres strengthening techniques for lumbar strain   PT Home Exercise Plan Hooklying single knee to chest 30s bilateral x 3, 3 times/day; Hooklying lumbar rotation stretch 30s bilateral x 3, 3 times/day   Consulted and Agree with Plan of Care Patient      Patient will benefit from skilled therapeutic intervention in order to improve the following deficits and impairments:  Decreased strength, Pain, Decreased range of motion, Abnormal gait  Visit Diagnosis: Acute bilateral low back pain without sciatica  Muscle weakness (generalized)     Problem List Patient Active Problem List   Diagnosis Date  Noted  . Chronic right-sided low back pain without sciatica 10/03/2016  . BPH (benign prostatic hyperplasia) 07/01/2016  . Urinary frequency 01/08/2016  . Myasthenia gravis ([place]) 10/09/2015  . Other symptoms and signs involving the musculoskeletal system 09/26/2015  . Binocular vision disorder with diplopia 09/26/2015  . Fatigue 09/26/2015  . Upper extremity weakness 09/20/2015  . TIA (transient ischemic attack) 09/13/2015  . CVA (cerebral infarction) 09/13/2015  . Sweating 07/20/2015  . Cold feet 07/20/2015  . Health care maintenance 01/25/2015  . B12 deficiency 11/16/2014  . Obesity (BMI 30-39.9) 09/27/2014  . Anemia 09/27/2014  . Obesity (BMI 30.0-34.9) 05/29/2014  . Hyponatremia 09/20/2013  . Diabetes mellitus ([place]) 12/06/2011  . Hypercholesterolemia 08/15/2010  . HYPERTENSION, BENIGN 08/15/2010  . PALPITATIONS, OCCASIONAL 08/15/2010    Blythe Stanford, PT DPT 01/03/2017, 2:30 PM  [place] MAIN [facility] SERVICES [address] [place], [place], [address] Phone: [phone]   Fax:  [phone]  Name: [name] MRN: [id number] Date of Birth: [birth year]

## 2017-01-07 ENCOUNTER — Encounter: Payer: Self-pay | Admitting: Physical Therapy

## 2017-01-07 ENCOUNTER — Ambulatory Visit: Payer: Medicare Other | Admitting: Physical Therapy

## 2017-01-07 VITALS — BP 161/60 | HR 63

## 2017-01-07 DIAGNOSIS — M6281 Muscle weakness (generalized): Secondary | ICD-10-CM

## 2017-01-07 DIAGNOSIS — M545 Low back pain, unspecified: Secondary | ICD-10-CM

## 2017-01-07 NOTE — Therapy (Signed)
[place] MAIN [facility] SERVICES [address] [place], [place], [address] Phone: [phone]   Fax:  [phone]  Physical Therapy Treatment  Patient Details  Name: [name] MRN: [id number] Date of Birth: 1926-08-19 Referring Provider: Mable Paris  Encounter Date: 01/07/2017      PT End of Session - 01/07/17 0953    Visit Number 15   Number of Visits 25   Date for PT Re-Evaluation February 21, 2017   Authorization Type g codes 5/10   PT Start Time 0958   PT Stop Time 1047   PT Time Calculation (min) 49 min   Activity Tolerance Patient tolerated treatment well;Patient limited by pain   Behavior During Therapy [facility] for tasks assessed/performed      Past Medical History:  Diagnosis Date  . Allergic rhinitis   . BPH (benign prostatic hypertrophy)   . Degenerative arthritis   . Diabetes mellitus ([place])   . GERD (gastroesophageal reflux disease)   . HTN (hypertension)   . Hypercholesterolemia   . HYPERTENSION, BENIGN 08/15/2010   Qualifier: Diagnosis of  By: Rockey Situ MD, Tim    . Iron deficiency anemia   . Skin cancer   . TIA (transient ischemic attack) 09/13/2015  . Urinary outflow obstruction    mild    Past Surgical History:  Procedure Laterality Date  . PILONIDAL CYST EXCISION  1951   removal  . [place]  . SKIN CANCER EXCISION     multiple  . SQUAMOUS CELL CARCINOMA EXCISION     behind head  . TONSILLECTOMY AND ADENOIDECTOMY  1938    Vitals:   01/07/17 1000  BP: (!) 161/60  Pulse: 63  SpO2: 100%        Subjective Assessment - 01/07/17 1000    Subjective Pt reports he has some soreness in his lower back but says he is improving.  Believes his lower back is feeling better and he can tell a difference today which he attributes to his extension exercises.  He reports his pain is no longer down his L leg but has centralized in his low back.     Pertinent History Pt reports that he had a right low  back muscle strain approximately 6 weeks ago when turning his wife in bed (wife is bedridden). Pain was initially in the right low back but then he started having some pain on the left side of his back as well. Pt reports that he used capsaicin and lidocaine patches which helped with the pain. Pain has been gradually improving and he is only currently having soreness in his bilateral low back. Approximately 1 year ago he was diagnosed with myasthenia gravis. He went to see [place] neurology and had immunoglobulin therapy. Reports 85-90% improvement in symptoms since that time. Pt reports that he has also has a "disc problem" in his lower back with pain that runs down his L posterior thigh. He has been seeing a chiropractor for this problem for the last 4-5 years. Denies radicular symptoms related to current episode. Denies N/T or bowel/bladder changes. Pain was initially waking pt from sleep but not currently. Remote history of skin cancer. ROS negative for red flags.    Limitations Standing   How long can you stand comfortably? 15 minutes   Currently in Pain? Yes   Pain Score 7    Pain Location Back   Pain Orientation Lower   Pain Descriptors / Indicators Aching   Pain Type Chronic pain  Pain Onset More than a month ago       TREATMENT   Therapeutic exercise:  3 sets of prone extension x 10 repetitions  Prone Bil hip extension 3x10 with cues for glute activation throughout  3 sets of standing repeated extensions at countertop x 10 reps  Standing hip 4 way with RTB 2x10. Fatigue at end of each set, especially with Bil hip abduction.  Sideways walking in // bars with RTB around ankles with cues for slight knee bend for greater activation of glutes x8 lengths in each direction.  SLS 3x30 seconds each LE with intermittent UE support in // bars   At the end of session pt reports his pain as a "6/10 but it feels better".             PT Education - 01/07/17 [phone]    Education provided Yes    Education Details Exercise technique; educated pt on theory of Teacher, adult education) Educated Patient   Methods Explanation;Demonstration;Verbal cues   Comprehension Verbalized understanding;Need further instruction;Returned demonstration             PT Long Term Goals - 12/17/16 1022      PT LONG TERM GOAL #1   Title Pt will be independent with HEP in order to improve strength and balance in order to decrease fall risk and improve function at home and work.   Time 6   Period Weeks   Status On-going     PT LONG TERM GOAL #2   Title Pt will report worst pain as 6/10 on NPRS in order to demonstrate clinically significant reduction in pain   Baseline 5/10   Period Weeks   Status Achieved     PT LONG TERM GOAL #3   Title Pt will report ability to help turn wife in bed without increase in low back pain   Time 6   Period Weeks   Status Achieved     PT LONG TERM GOAL #4   Title Pt will be able to stand for at least 30 minutes before needing to sit due to back pain   Baseline 15   Time 6   Period Weeks   Status On-going               Plan - 01/07/17 1027    Clinical Impression Statement Pt reports an improvement in his pain in his lower back which sounds like it has begun to demonstrate centralization. Therefore, continued extension exercises today.  Pt demonstrated fatigue with Bil hip strengthening exercises, especially with abduction.  Pt requires rest breaks between strengthening exercises due to fatigue.  Pt is making good improvement but continues to have 7/10 pain in low back and will benefit from continued skilled PT interventions for improved strength and decreased pain.   Rehab Potential Good   Clinical Impairments Affecting Rehab Potential positive: improving symptoms, motivation; negative: age   PT Frequency 2x / week   PT Duration 6 weeks   PT Treatment/Interventions Aquatic Therapy;Cryotherapy;Electrical Stimulation;Iontophoresis 4mg /ml  Dexamethasone;Moist Heat;Traction;Ultrasound;Gait training;Therapeutic activities;Therapeutic exercise;Balance training;Neuromuscular re-education;Patient/family education;Manual techniques;Passive range of motion;Dry needling   PT Next Visit Plan   pelvic tilts, progres strengthening techniques for lumbar strain   PT Home Exercise Plan Hooklying single knee to chest 30s bilateral x 3, 3 times/day; Hooklying lumbar rotation stretch 30s bilateral x 3, 3 times/day   Consulted and Agree with Plan of Care Patient      Patient will benefit from skilled therapeutic intervention in  order to improve the following deficits and impairments:  Decreased strength, Pain, Decreased range of motion, Abnormal gait  Visit Diagnosis: Acute bilateral low back pain without sciatica  Muscle weakness (generalized)     Problem List Patient Active Problem List   Diagnosis Date Noted  . Chronic right-sided low back pain without sciatica 10/03/2016  . BPH (benign prostatic hyperplasia) 07/01/2016  . Urinary frequency 01/08/2016  . Myasthenia gravis ([place]) 10/09/2015  . Other symptoms and signs involving the musculoskeletal system 09/26/2015  . Binocular vision disorder with diplopia 09/26/2015  . Fatigue 09/26/2015  . Upper extremity weakness 09/20/2015  . TIA (transient ischemic attack) 09/13/2015  . CVA (cerebral infarction) 09/13/2015  . Sweating 07/20/2015  . Cold feet 07/20/2015  . Health care maintenance 01/25/2015  . B12 deficiency 11/16/2014  . Obesity (BMI 30-39.9) 09/27/2014  . Anemia 09/27/2014  . Obesity (BMI 30.0-34.9) 05/29/2014  . Hyponatremia 09/20/2013  . Diabetes mellitus ([place]) 12/06/2011  . Hypercholesterolemia 08/15/2010  . HYPERTENSION, BENIGN 08/15/2010  . PALPITATIONS, OCCASIONAL 08/15/2010     Collie Siad PT, DPT 01/07/2017, 10:49 AM  [place] MAIN [facility] SERVICES [address] [place], [place], [address] Phone: [phone]   Fax:   [phone]  Name: [name] MRN: [id number] Date of Birth: 1926-08-09

## 2017-01-08 DIAGNOSIS — M5136 Other intervertebral disc degeneration, lumbar region: Secondary | ICD-10-CM | POA: Diagnosis not present

## 2017-01-08 DIAGNOSIS — M9905 Segmental and somatic dysfunction of pelvic region: Secondary | ICD-10-CM | POA: Diagnosis not present

## 2017-01-08 DIAGNOSIS — M9903 Segmental and somatic dysfunction of lumbar region: Secondary | ICD-10-CM | POA: Diagnosis not present

## 2017-01-08 DIAGNOSIS — M955 Acquired deformity of pelvis: Secondary | ICD-10-CM | POA: Diagnosis not present

## 2017-01-09 ENCOUNTER — Ambulatory Visit: Payer: Medicare Other | Admitting: Physical Therapy

## 2017-01-09 ENCOUNTER — Encounter: Payer: Self-pay | Admitting: Physical Therapy

## 2017-01-09 VITALS — BP 162/82 | HR 62

## 2017-01-09 DIAGNOSIS — M545 Low back pain, unspecified: Secondary | ICD-10-CM

## 2017-01-09 DIAGNOSIS — M6281 Muscle weakness (generalized): Secondary | ICD-10-CM | POA: Diagnosis not present

## 2017-01-09 NOTE — Therapy (Signed)
[place] MAIN [facility] SERVICES [address] [place], [place], [address] Phone: [phone]   Fax:  [phone]  Physical Therapy Treatment  Patient Details  Name: [name] MRN: [id number] Date of Birth: [birth year] Referring Provider: Mable Paris  Encounter Date: 01/09/2017      PT End of Session - 01/09/17 0955    Visit Number 16   Number of Visits 25   Date for PT Re-Evaluation 02-24-2017   Authorization Type g codes 6/10   PT Start Time 0958   PT Stop Time 1043   PT Time Calculation (min) 45 min   Activity Tolerance Patient tolerated treatment well;Patient limited by pain   Behavior During Therapy [facility] for tasks assessed/performed      Past Medical History:  Diagnosis Date  . Allergic rhinitis   . BPH (benign prostatic hypertrophy)   . Degenerative arthritis   . Diabetes mellitus ([place])   . GERD (gastroesophageal reflux disease)   . HTN (hypertension)   . Hypercholesterolemia   . HYPERTENSION, BENIGN 08/15/2010   Qualifier: Diagnosis of  By: Rockey Situ MD, Tim    . Iron deficiency anemia   . Skin cancer   . TIA (transient ischemic attack) 09/13/2015  . Urinary outflow obstruction    mild    Past Surgical History:  Procedure Laterality Date  . PILONIDAL CYST EXCISION  1951   removal  . [place]  . SKIN CANCER EXCISION     multiple  . SQUAMOUS CELL CARCINOMA EXCISION     behind head  . TONSILLECTOMY AND ADENOIDECTOMY  1938    Vitals:   01/09/17 1003  BP: (!) 162/82  Pulse: 62  SpO2: 99%        Subjective Assessment - 01/09/17 1003    Subjective Pt reports he was sore after last session that lasted into the next day but says he believes this was a good sore that was a result of working the muscles.  He feels that his LBP is getting better and fluctuates from 4-7/10.  He has been completing his HEP every day.  No new complaints or concerns today.   Pertinent History Pt reports that he had  a right low back muscle strain approximately 6 weeks ago when turning his wife in bed (wife is bedridden). Pain was initially in the right low back but then he started having some pain on the left side of his back as well. Pt reports that he used capsaicin and lidocaine patches which helped with the pain. Pain has been gradually improving and he is only currently having soreness in his bilateral low back. Approximately 1 year ago he was diagnosed with myasthenia gravis. He went to see [place] neurology and had immunoglobulin therapy. Reports 85-90% improvement in symptoms since that time. Pt reports that he has also has a "disc problem" in his lower back with pain that runs down his L posterior thigh. He has been seeing a chiropractor for this problem for the last 4-5 years. Denies radicular symptoms related to current episode. Denies N/T or bowel/bladder changes. Pain was initially waking pt from sleep but not currently. Remote history of skin cancer. ROS negative for red flags.    Limitations Standing   How long can you stand comfortably? 15 minutes   Currently in Pain? Yes   Pain Score 7    Pain Location Back   Pain Orientation Lower   Pain Descriptors / Indicators Aching   Pain Type  Chronic pain   Pain Onset More than a month ago   Pain Frequency Intermittent        TREATMENT   Therapeutic exercise:  3 sets of prone extension x 10 repetitions  Bridges with cues for glute and core activation 2x10. Very difficult for pt due to BLE weakness.  Bil leg press 105# 3x10 with cues for eccentric control. 1x15 120#  2 sets of standing repeated extensions at countertop x10 reps  Standing hip E with RTB 2x10. Fatigue at end of each set.  Sideways walking in // bars with RTB around ankles with cues for slight knee bend and glute squeeze for greater activation of glutes 2x8 lengths in each direction.  Posterior pelvic tilts standing against wall with verbal and tactile cues. 2x10  Standing marching  against RTB for hip flexion strengthening. 2x20. Challenging for pt and fatigue noted after 13 reps each side.            PT Education - 01/09/17 0955    Education provided Yes   Education Details Exercise technique   Person(s) Educated Patient   Methods Explanation;Demonstration;Verbal cues   Comprehension Verbalized understanding;Returned demonstration;Need further instruction             PT Long Term Goals - 12/17/16 1022      PT LONG TERM GOAL #1   Title Pt will be independent with HEP in order to improve strength and balance in order to decrease fall risk and improve function at home and work.   Time 6   Period Weeks   Status On-going     PT LONG TERM GOAL #2   Title Pt will report worst pain as 6/10 on NPRS in order to demonstrate clinically significant reduction in pain   Baseline 5/10   Period Weeks   Status Achieved     PT LONG TERM GOAL #3   Title Pt will report ability to help turn wife in bed without increase in low back pain   Time 6   Period Weeks   Status Achieved     PT LONG TERM GOAL #4   Title Pt will be able to stand for at least 30 minutes before needing to sit due to back pain   Baseline 15   Time 6   Period Weeks   Status On-going               Plan - 01/09/17 1024    Clinical Impression Statement Pt reports difficulty with activities requiring hip flexion (i.e. lifting legs when getting into/out of car) so a hip flexion exercise was introduced today.  Additionally, noted that pt had much difficulty with bridges due to BLE weakness so leg press was completed today to address this weakness.  He will benefit from continued skilled PT interventions to improve strength, decrease pain, and improve QOL.   Rehab Potential Good   Clinical Impairments Affecting Rehab Potential positive: improving symptoms, motivation; negative: age   PT Frequency 2x / week   PT Duration 6 weeks   PT Treatment/Interventions Aquatic  Therapy;Cryotherapy;Electrical Stimulation;Iontophoresis 4mg /ml Dexamethasone;Moist Heat;Traction;Ultrasound;Gait training;Therapeutic activities;Therapeutic exercise;Balance training;Neuromuscular re-education;Patient/family education;Manual techniques;Passive range of motion;Dry needling   PT Next Visit Plan   pelvic tilts, progres strengthening techniques for lumbar strain   PT Home Exercise Plan Hooklying single knee to chest 30s bilateral x 3, 3 times/day; Hooklying lumbar rotation stretch 30s bilateral x 3, 3 times/day   Consulted and Agree with Plan of Care Patient  Patient will benefit from skilled therapeutic intervention in order to improve the following deficits and impairments:  Decreased strength, Pain, Decreased range of motion, Abnormal gait  Visit Diagnosis: Acute bilateral low back pain without sciatica  Muscle weakness (generalized)     Problem List Patient Active Problem List   Diagnosis Date Noted  . Chronic right-sided low back pain without sciatica 10/03/2016  . BPH (benign prostatic hyperplasia) 07/01/2016  . Urinary frequency 01/08/2016  . Myasthenia gravis ([place]) 10/09/2015  . Other symptoms and signs involving the musculoskeletal system 09/26/2015  . Binocular vision disorder with diplopia 09/26/2015  . Fatigue 09/26/2015  . Upper extremity weakness 09/20/2015  . TIA (transient ischemic attack) 09/13/2015  . CVA (cerebral infarction) 09/13/2015  . Sweating 07/20/2015  . Cold feet 07/20/2015  . Health care maintenance 01/25/2015  . B12 deficiency 11/16/2014  . Obesity (BMI 30-39.9) 09/27/2014  . Anemia 09/27/2014  . Obesity (BMI 30.0-34.9) 05/29/2014  . Hyponatremia 09/20/2013  . Diabetes mellitus ([place]) 12/06/2011  . Hypercholesterolemia 08/15/2010  . HYPERTENSION, BENIGN 08/15/2010  . PALPITATIONS, OCCASIONAL 08/15/2010    Collie Siad PT, DPT 01/09/2017, 10:43 AM  [place] MAIN [facility] SERVICES [address] [place], [place], [address] Phone: [phone]   Fax:  [phone]  Name: [name] MRN: [id number] Date of Birth: [birth year]

## 2017-01-15 ENCOUNTER — Encounter: Payer: Self-pay | Admitting: Physical Therapy

## 2017-01-15 ENCOUNTER — Ambulatory Visit: Payer: Medicare Other | Admitting: Physical Therapy

## 2017-01-15 DIAGNOSIS — M545 Low back pain, unspecified: Secondary | ICD-10-CM

## 2017-01-15 DIAGNOSIS — M6281 Muscle weakness (generalized): Secondary | ICD-10-CM

## 2017-01-15 NOTE — Therapy (Addendum)
[place] MAIN [facility] SERVICES [address] [place], [place], [address] Phone: [phone]   Fax:  [phone]  Physical Therapy Treatment  Patient Details  Name: [name] MRN: [id number] Date of Birth: 1926/03/12 Referring Provider: Mable Paris  Encounter Date: 01/15/2017      PT End of Session - 01/15/17 1023    Visit Number 17   Number of Visits 25   Date for PT Re-Evaluation 02-22-2017   Authorization Type g codes 7/10   PT Start Time 7673   PT Stop Time 1100   PT Time Calculation (min) 45 min   Activity Tolerance Patient tolerated treatment well;Patient limited by pain   Behavior During Therapy [facility] for tasks assessed/performed      Past Medical History:  Diagnosis Date  . Allergic rhinitis   . BPH (benign prostatic hypertrophy)   . Degenerative arthritis   . Diabetes mellitus ([place])   . GERD (gastroesophageal reflux disease)   . HTN (hypertension)   . Hypercholesterolemia   . HYPERTENSION, BENIGN 08/15/2010   Qualifier: Diagnosis of  By: Rockey Situ MD, Tim    . Iron deficiency anemia   . Skin cancer   . TIA (transient ischemic attack) 09/13/2015  . Urinary outflow obstruction    mild    Past Surgical History:  Procedure Laterality Date  . PILONIDAL CYST EXCISION  1951   removal  . [place]  . SKIN CANCER EXCISION     multiple  . SQUAMOUS CELL CARCINOMA EXCISION     behind head  . TONSILLECTOMY AND ADENOIDECTOMY  1938    There were no vitals filed for this visit.      Subjective Assessment - 01/15/17 1019    Subjective Patient reports that he is having no sharp cutting pain, just muscle pain that is 5/10. He says he has trouble with bending down. He can do it but the first part is painful . He conintues to do his HEP for lumbar extension exercises.    Pertinent History Pt reports that he had a right low back muscle strain approximately 6 weeks ago when turning his wife in bed (wife is  bedridden). Pain was initially in the right low back but then he started having some pain on the left side of his back as well. Pt reports that he used capsaicin and lidocaine patches which helped with the pain. Pain has been gradually improving and he is only currently having soreness in his bilateral low back. Approximately 1 year ago he was diagnosed with myasthenia gravis. He went to see [place] neurology and had immunoglobulin therapy. Reports 85-90% improvement in symptoms since that time. Pt reports that he has also has a "disc problem" in his lower back with pain that runs down his L posterior thigh. He has been seeing a chiropractor for this problem for the last 4-5 years. Denies radicular symptoms related to current episode. Denies N/T or bowel/bladder changes. Pain was initially waking pt from sleep but not currently. Remote history of skin cancer. ROS negative for red flags.    Limitations Standing   How long can you stand comfortably? 15 minutes   Patient Stated Goals to be able to stand longer.   Currently in Pain? Yes   Pain Score 5    Pain Location Back   Pain Orientation Lower   Pain Descriptors / Indicators Sore   Pain Type Chronic pain   Pain Radiating Towards not radiating today, staying in  his low back   Pain Onset More than a month ago   Pain Frequency Constant   Aggravating Factors  standing   Pain Relieving Factors sitting   Multiple Pain Sites No        TREATMENT  Manual therapy: STM to BLE SI joint and paraspinal muscles x 15 minutes  PA glides grade 3 and 4 to L 1- 5 x 30 bouts x 5 sets  Therapeutic exercise:  Standing with TA and BTB and trunk rotation x 10 Standing with TA contraction and Ue flex with BTB x 10 3 sets of prone extension x 10 repetitions , left hip flexed x 10 x 3 with prone extension Prone Bil hip extension 3x10 with cues for glute activation throughout  Supine hooklying with TA and BLE marching x 20 x 2 3 sets of standing repeated extensions  at countertop x 10 reps  Sideways walking in // bars with RTB around ankles with cues for slight knee bend for greater activation of glutes x8 lengths in each direction.    At the end of session pt reports his pain as a "5/10 but it feels better".                            PT Education - 01/15/17 1023    Education provided Yes   Education Details heat and HEP   Person(s) Educated Patient   Methods Explanation   Comprehension Verbalized understanding             PT Long Term Goals - 01/15/17 1044      PT LONG TERM GOAL #1   Title Pt will be independent with HEP in order to improve strength and balance in order to decrease fall risk and improve function at home and work.   Time 6   Period Weeks   Status On-going     PT LONG TERM GOAL #2   Title Pt will report worst pain as 6/10 on NPRS in order to demonstrate clinically significant reduction in pain   Baseline 5/10   Time 6   Period Weeks   Status Achieved     PT LONG TERM GOAL #3   Title Pt will report ability to help turn wife in bed without increase in low back pain   Time 6   Period Weeks   Status Achieved     PT LONG TERM GOAL #4   Title Pt will be able to stand for at least 30 minutes before needing to sit due to back pain   Baseline 15 minutes   Time 6   Period Weeks   Status On-going               Plan - 01/15/17 1024    Clinical Impression Statement Patient reports difficutly with prone press ups and he is not able to get fully into the correct position due to limited trunk extension and tight hip flexors for prone extension exercise; also added left knee flex with prone extension x 10 reps.  He is able to tolerate overpressure with prone extenison exercise. He repsonds to manual therapy and has less pain following manual therapy.    Rehab Potential Good   Clinical Impairments Affecting Rehab Potential positive: improving symptoms, motivation; negative: age   PT Frequency 2x  / week   PT Duration 6 weeks   PT Treatment/Interventions Aquatic Therapy;Cryotherapy;Electrical Stimulation;Iontophoresis 4mg /ml Dexamethasone;Moist Heat;Traction;Ultrasound;Gait training;Therapeutic activities;Therapeutic exercise;Balance training;Neuromuscular re-education;Patient/family education;Manual  techniques;Passive range of motion;Dry needling   PT Next Visit Plan   pelvic tilts, progres strengthening techniques for lumbar strain   PT Home Exercise Plan Hooklying single knee to chest 30s bilateral x 3, 3 times/day; Hooklying lumbar rotation stretch 30s bilateral x 3, 3 times/day   Consulted and Agree with Plan of Care Patient      Patient will benefit from skilled therapeutic intervention in order to improve the following deficits and impairments:  Decreased strength, Pain, Decreased range of motion, Abnormal gait  Visit Diagnosis: Acute bilateral low back pain without sciatica  Muscle weakness (generalized)     Problem List Patient Active Problem List   Diagnosis Date Noted  . Chronic right-sided low back pain without sciatica 10/03/2016  . BPH (benign prostatic hyperplasia) 07/01/2016  . Urinary frequency 01/08/2016  . Myasthenia gravis ([place]) 10/09/2015  . Other symptoms and signs involving the musculoskeletal system 09/26/2015  . Binocular vision disorder with diplopia 09/26/2015  . Fatigue 09/26/2015  . Upper extremity weakness 09/20/2015  . TIA (transient ischemic attack) 09/13/2015  . CVA (cerebral infarction) 09/13/2015  . Sweating 07/20/2015  . Cold feet 07/20/2015  . Health care maintenance 01/25/2015  . B12 deficiency 11/16/2014  . Obesity (BMI 30-39.9) 09/27/2014  . Anemia 09/27/2014  . Obesity (BMI 30.0-34.9) 05/29/2014  . Hyponatremia 09/20/2013  . Diabetes mellitus ([place]) 12/06/2011  . Hypercholesterolemia 08/15/2010  . HYPERTENSION, BENIGN 08/15/2010  . PALPITATIONS, OCCASIONAL 08/15/2010   Alanson Puls, PT, DPT [place], Minette Headland  S 01/15/2017, 11:00 AM  [place] MAIN [facility] SERVICES [address] [place], [place], [address] Phone: [phone]   Fax:  [phone]  Name: TAYTON DECAIRE MRN: [id number] Date of Birth: 31-Aug-1926

## 2017-01-15 NOTE — Patient Instructions (Addendum)
(  Clinic) Flexion: Pelvic Tilt    Lie with neck supported, knees bent, feet flat. Tighten and suck stomach in, pushing back down against surface. Do not push down with legs. Repeat ____ times per set. Do ____ sets per session. Do ____ sessions per week.  Copyright  VHI. All rights reserved.

## 2017-01-21 DIAGNOSIS — M9905 Segmental and somatic dysfunction of pelvic region: Secondary | ICD-10-CM | POA: Diagnosis not present

## 2017-01-21 DIAGNOSIS — M9903 Segmental and somatic dysfunction of lumbar region: Secondary | ICD-10-CM | POA: Diagnosis not present

## 2017-01-21 DIAGNOSIS — M955 Acquired deformity of pelvis: Secondary | ICD-10-CM | POA: Diagnosis not present

## 2017-01-21 DIAGNOSIS — M5136 Other intervertebral disc degeneration, lumbar region: Secondary | ICD-10-CM | POA: Diagnosis not present

## 2017-01-22 ENCOUNTER — Other Ambulatory Visit: Payer: Self-pay

## 2017-01-22 ENCOUNTER — Ambulatory Visit: Payer: Medicare Other | Admitting: Physical Therapy

## 2017-01-22 ENCOUNTER — Encounter: Payer: Self-pay | Admitting: Physical Therapy

## 2017-01-22 DIAGNOSIS — M545 Low back pain, unspecified: Secondary | ICD-10-CM

## 2017-01-22 DIAGNOSIS — M6281 Muscle weakness (generalized): Secondary | ICD-10-CM

## 2017-01-22 MED ORDER — VALSARTAN-HYDROCHLOROTHIAZIDE 320-12.5 MG PO TABS: ORAL_TABLET | ORAL | 0 refills | Status: DC

## 2017-01-22 NOTE — Therapy (Signed)
[place] MAIN [facility] SERVICES [address] [place], [place], [address] Phone: [phone]   Fax:  [phone]  Physical Therapy Treatment  Patient Details  Name: [name] MRN: [id number] Date of Birth: 1926-09-12 Referring Provider: Mable Paris  Encounter Date: 01/22/2017      PT End of Session - 01/22/17 1017    Visit Number 18   Number of Visits 25   Date for PT Re-Evaluation 01/31/2017   Authorization Type g codes 8/10   PT Start Time 1004-02-05   PT Stop Time 1045   PT Time Calculation (min) 40 min   Activity Tolerance Patient tolerated treatment well;Patient limited by pain   Behavior During Therapy [facility] for tasks assessed/performed      Past Medical History:  Diagnosis Date  . Allergic rhinitis   . BPH (benign prostatic hypertrophy)   . Degenerative arthritis   . Diabetes mellitus ([place])   . GERD (gastroesophageal reflux disease)   . HTN (hypertension)   . Hypercholesterolemia   . HYPERTENSION, BENIGN 08/15/2010   Qualifier: Diagnosis of  By: Rockey Situ MD, Tim    . Iron deficiency anemia   . Skin cancer   . TIA (transient ischemic attack) 09/13/2015  . Urinary outflow obstruction    mild    Past Surgical History:  Procedure Laterality Date  . PILONIDAL CYST EXCISION  1951   removal  . [place]  . SKIN CANCER EXCISION     multiple  . SQUAMOUS CELL CARCINOMA EXCISION     behind head  . TONSILLECTOMY AND ADENOIDECTOMY  1938    There were no vitals filed for this visit.      Subjective Assessment - 01/22/17 1015    Subjective Patient reports that he is having only soreness in his back but then rates his soreness 5/10. No new complaints.   Pertinent History Pt reports that he had a right low back muscle strain approximately 6 weeks ago when turning his wife in bed (wife is bedridden). Pain was initially in the right low back but then he started having some pain on the left side of his back as  well. Pt reports that he used capsaicin and lidocaine patches which helped with the pain. Pain has been gradually improving and he is only currently having soreness in his bilateral low back. Approximately 1 year ago he was diagnosed with myasthenia gravis. He went to see [place] neurology and had immunoglobulin therapy. Reports 85-90% improvement in symptoms since that time. Pt reports that he has also has a "disc problem" in his lower back with pain that runs down his L posterior thigh. He has been seeing a chiropractor for this problem for the last 4-5 years. Denies radicular symptoms related to current episode. Denies N/T or bowel/bladder changes. Pain was initially waking pt from sleep but not currently. Remote history of skin cancer. ROS negative for red flags.    Limitations Standing   How long can you stand comfortably? 15 minutes   Patient Stated Goals to be able to stand longer.   Currently in Pain? Yes   Pain Score 5    Pain Location Back   Pain Orientation Lower;Right   Pain Descriptors / Indicators Sore   Pain Type Chronic pain   Pain Onset More than a month ago   Pain Frequency Constant   Aggravating Factors  standing   Pain Relieving Factors sitting   Multiple Pain Sites No  Therapeutic exercise:  Hooklying hip abd/ER x 15 x 2 BLE with TA Standing trunk extension x 10 x 3 sets Single leg to chest  X 30 sec x 3 Bridging x 10 x 2 Trunk rotation left and right x 20 with TA Standing trunk rotation with TA x 20 x 2 left and right Standing shoulder flex x 20 with TA hooklying with TA and marching x 20 x 2 Prone trunk extension x 10 x 3  Patient needs cues for correct posture and correct technique and to perform exercises slowly and keeping his TA tight.  Patient has no decrease of soreness in low back from 5/10 .  Marland Kitchen                            PT Education - 01/22/17 1017    Education provided Yes   Education Details heat and HEP   Person(s)  Educated Patient   Methods Explanation   Comprehension Verbalized understanding             PT Long Term Goals - 01/15/17 1044      PT LONG TERM GOAL #1   Title Pt will be independent with HEP in order to improve strength and balance in order to decrease fall risk and improve function at home and work.   Time 6   Period Weeks   Status On-going     PT LONG TERM GOAL #2   Title Pt will report worst pain as 6/10 on NPRS in order to demonstrate clinically significant reduction in pain   Baseline 5/10   Time 6   Period Weeks   Status Achieved     PT LONG TERM GOAL #3   Title Pt will report ability to help turn wife in bed without increase in low back pain   Time 6   Period Weeks   Status Achieved     PT LONG TERM GOAL #4   Title Pt will be able to stand for at least 30 minutes before needing to sit due to back pain   Baseline 15 minutes   Time 6   Period Weeks   Status On-going             Patient will benefit from skilled therapeutic intervention in order to improve the following deficits and impairments:     Visit Diagnosis: Acute bilateral low back pain without sciatica  Muscle weakness (generalized)     Problem List Patient Active Problem List   Diagnosis Date Noted  . Chronic right-sided low back pain without sciatica 10/03/2016  . BPH (benign prostatic hyperplasia) 07/01/2016  . Urinary frequency 01/08/2016  . Myasthenia gravis ([place]) 10/09/2015  . Other symptoms and signs involving the musculoskeletal system 09/26/2015  . Binocular vision disorder with diplopia 09/26/2015  . Fatigue 09/26/2015  . Upper extremity weakness 09/20/2015  . TIA (transient ischemic attack) 09/13/2015  . CVA (cerebral infarction) 09/13/2015  . Sweating 07/20/2015  . Cold feet 07/20/2015  . Health care maintenance 01/25/2015  . B12 deficiency 11/16/2014  . Obesity (BMI 30-39.9) 09/27/2014  . Anemia 09/27/2014  . Obesity (BMI 30.0-34.9) 05/29/2014  . Hyponatremia  09/20/2013  . Diabetes mellitus ([place]) 12/06/2011  . Hypercholesterolemia 08/15/2010  . HYPERTENSION, BENIGN 08/15/2010  . PALPITATIONS, OCCASIONAL 08/15/2010  Alanson Puls, PT, DPT  [place], Minette Headland S 01/22/2017, 11:03 AM  [place] MAIN [facility] SERVICES [address] [place], [place], [address]  Phone: [phone]   Fax:  [phone]  Name: [name] MRN: [id number] Date of Birth: 11/12/1925

## 2017-01-24 ENCOUNTER — Ambulatory Visit: Payer: Medicare Other | Admitting: Physical Therapy

## 2017-01-24 ENCOUNTER — Encounter: Payer: Self-pay | Admitting: Physical Therapy

## 2017-01-24 DIAGNOSIS — M6281 Muscle weakness (generalized): Secondary | ICD-10-CM | POA: Diagnosis not present

## 2017-01-24 DIAGNOSIS — M545 Low back pain, unspecified: Secondary | ICD-10-CM

## 2017-01-24 NOTE — Therapy (Signed)
[place] MAIN [facility] SERVICES [address] [place], [place], [address] Phone: [phone]   Fax:  [phone]  Physical Therapy Treatment  Patient Details  Name: [name] MRN: [id number] Date of Birth: 07-18-1926 Referring Provider: Mable Paris  Encounter Date: 01/24/2017      PT End of Session - 01/24/17 1023    Visit Number 19   Number of Visits 25   Date for PT Re-Evaluation 02-02-2017   Authorization Type g codes 2023-07-21   PT Start Time 1010   PT Stop Time 1050   PT Time Calculation (min) 40 min   Activity Tolerance Patient tolerated treatment well;Patient limited by pain   Behavior During Therapy [facility] for tasks assessed/performed      Past Medical History:  Diagnosis Date  . Allergic rhinitis   . BPH (benign prostatic hypertrophy)   . Degenerative arthritis   . Diabetes mellitus ([place])   . GERD (gastroesophageal reflux disease)   . HTN (hypertension)   . Hypercholesterolemia   . HYPERTENSION, BENIGN 08/15/2010   Qualifier: Diagnosis of  By: Rockey Situ MD, Tim    . Iron deficiency anemia   . Skin cancer   . TIA (transient ischemic attack) 09/13/2015  . Urinary outflow obstruction    mild    Past Surgical History:  Procedure Laterality Date  . PILONIDAL CYST EXCISION  1951   removal  . [place]  . SKIN CANCER EXCISION     multiple  . SQUAMOUS CELL CARCINOMA EXCISION     behind head  . TONSILLECTOMY AND ADENOIDECTOMY  1938    There were no vitals filed for this visit.      Subjective Assessment - 01/24/17 1021    Subjective Patient reports that he is having only soreness in his back but then rates his soreness 4/10. No new complaints.   Pertinent History Pt reports that he had a right low back muscle strain approximately 6 weeks ago when turning his wife in bed (wife is bedridden). Pain was initially in the right low back but then he started having some pain on the left side of his back as  well. Pt reports that he used capsaicin and lidocaine patches which helped with the pain. Pain has been gradually improving and he is only currently having soreness in his bilateral low back. Approximately 1 year ago he was diagnosed with myasthenia gravis. He went to see [place] neurology and had immunoglobulin therapy. Reports 85-90% improvement in symptoms since that time. Pt reports that he has also has a "disc problem" in his lower back with pain that runs down his L posterior thigh. He has been seeing a chiropractor for this problem for the last 4-5 years. Denies radicular symptoms related to current episode. Denies N/T or bowel/bladder changes. Pain was initially waking pt from sleep but not currently. Remote history of skin cancer. ROS negative for red flags.    Limitations Standing   How long can you stand comfortably? 15 minutes   Patient Stated Goals to be able to stand longer.   Pain Score 4    Pain Location Back   Pain Orientation Lower;Right   Pain Descriptors / Indicators Sore   Pain Type Chronic pain   Pain Radiating Towards low back   Pain Onset More than a month ago   Pain Frequency Constant   Aggravating Factors  standing   Pain Relieving Factors sitting   Multiple Pain Sites No  Therapeutic exercise:   Standing with TA and BTB and trunk rotation x 10 x 2 Standing with TA contraction and Ue flex with BTB x 10 x 2 3 sets of prone extension x 10 repetitions , left hip flexed x 10 x 3 with prone extension Prone Bil hip extension 3x10 with cues for glute activation throughout  Supine hooklying with TA and BLE marching x 20 x 2 supine hooklying with TA and leg extension x 2 3 sets of standing repeated extensions at countertop x 10 reps  Standing with TA and trunk rotation x 10 each side x 2 All above exercises with cues for technique .  At the end of session pt reports his pain as a "4/10 but it feels better".                             PT  Education - 01/24/17 1023    Education provided Yes   Education Details heat and HEP   Person(s) Educated Patient   Methods Explanation   Comprehension Verbalized understanding             PT Long Term Goals - 01/15/17 1044      PT LONG TERM GOAL #1   Title Pt will be independent with HEP in order to improve strength and balance in order to decrease fall risk and improve function at home and work.   Time 6   Period Weeks   Status On-going     PT LONG TERM GOAL #2   Title Pt will report worst pain as 6/10 on NPRS in order to demonstrate clinically significant reduction in pain   Baseline 5/10   Time 6   Period Weeks   Status Achieved     PT LONG TERM GOAL #3   Title Pt will report ability to help turn wife in bed without increase in low back pain   Time 6   Period Weeks   Status Achieved     PT LONG TERM GOAL #4   Title Pt will be able to stand for at least 30 minutes before needing to sit due to back pain   Baseline 15 minutes   Time 6   Period Weeks   Status On-going               Plan - 01/24/17 1024    Clinical Impression Statement Patient is instructed in core strengthening for low back stability exericses. He was progressed into standing TA exercises for core strnegthening. Patinet was educated in stretching exericses for low back muscles. He has no increased pain during treatment and reports feeling 75% better than initially. He will benefit from continued skilled PT to decrease pain and increase walking and standing time with less pain behaviors.    Rehab Potential Good   Clinical Impairments Affecting Rehab Potential positive: improving symptoms, motivation; negative: age   PT Frequency 2x / week   PT Duration 6 weeks   PT Treatment/Interventions Aquatic Therapy;Cryotherapy;Electrical Stimulation;Iontophoresis 4mg /ml Dexamethasone;Moist Heat;Traction;Ultrasound;Gait training;Therapeutic activities;Therapeutic exercise;Balance training;Neuromuscular  re-education;Patient/family education;Manual techniques;Passive range of motion;Dry needling   PT Next Visit Plan   pelvic tilts, progres strengthening techniques for lumbar strain   PT Home Exercise Plan Hooklying single knee to chest 30s bilateral x 3, 3 times/day; Hooklying lumbar rotation stretch 30s bilateral x 3, 3 times/day   Consulted and Agree with Plan of Care Patient      Patient will benefit from skilled  therapeutic intervention in order to improve the following deficits and impairments:  Decreased strength, Pain, Decreased range of motion, Abnormal gait  Visit Diagnosis: Acute bilateral low back pain without sciatica  Muscle weakness (generalized)     Problem List Patient Active Problem List   Diagnosis Date Noted  . Chronic right-sided low back pain without sciatica 10/03/2016  . BPH (benign prostatic hyperplasia) 07/01/2016  . Urinary frequency 01/08/2016  . Myasthenia gravis ([place]) 10/09/2015  . Other symptoms and signs involving the musculoskeletal system 09/26/2015  . Binocular vision disorder with diplopia 09/26/2015  . Fatigue 09/26/2015  . Upper extremity weakness 09/20/2015  . TIA (transient ischemic attack) 09/13/2015  . CVA (cerebral infarction) 09/13/2015  . Sweating 07/20/2015  . Cold feet 07/20/2015  . Health care maintenance 01/25/2015  . B12 deficiency 11/16/2014  . Obesity (BMI 30-39.9) 09/27/2014  . Anemia 09/27/2014  . Obesity (BMI 30.0-34.9) 05/29/2014  . Hyponatremia 09/20/2013  . Diabetes mellitus ([place]) 12/06/2011  . Hypercholesterolemia 08/15/2010  . HYPERTENSION, BENIGN 08/15/2010  . PALPITATIONS, OCCASIONAL 08/15/2010   Alanson Puls, PT, DPT [place], [place]ette [place] S 01/24/2017, 10:29 AM  [place] MAIN [facility] SERVICES [address] [place], [place], [address] Phone: [phone]   Fax:  [phone]  Name: EULA MAZZOLA MRN: [id number] Date of Birth: 1926-07-03

## 2017-01-25 ENCOUNTER — Encounter: Payer: Medicare Other | Admitting: Internal Medicine

## 2017-01-28 ENCOUNTER — Encounter: Payer: Self-pay | Admitting: Physical Therapy

## 2017-01-28 ENCOUNTER — Ambulatory Visit: Payer: Medicare Other | Admitting: Physical Therapy

## 2017-01-28 DIAGNOSIS — M6281 Muscle weakness (generalized): Secondary | ICD-10-CM | POA: Diagnosis not present

## 2017-01-28 DIAGNOSIS — M545 Low back pain, unspecified: Secondary | ICD-10-CM

## 2017-01-28 NOTE — Therapy (Signed)
[place] MAIN [facility] SERVICES [address] [place], [place], [address] Phone: [phone]   Fax:  [phone]  Physical Therapy Treatment/Discharge Summary  Patient Details  Name: [name] MRN: [id number] Date of Birth: 10-10-1926 Referring Provider: Mable Paris  Encounter Date: 2017/02/10      PT End of Session - 02-10-17 0846    Visit Number 20   Number of Visits 25   Date for PT Re-Evaluation February 10, 2017   Authorization Type g codes 10/10   PT Start Time 0835   PT Stop Time 0915   PT Time Calculation (min) 40 min   Activity Tolerance Patient tolerated treatment well;Patient limited by pain   Behavior During Therapy [facility] for tasks assessed/performed      Past Medical History:  Diagnosis Date  . Allergic rhinitis   . BPH (benign prostatic hypertrophy)   . Degenerative arthritis   . Diabetes mellitus ([place])   . GERD (gastroesophageal reflux disease)   . HTN (hypertension)   . Hypercholesterolemia   . HYPERTENSION, BENIGN 08/15/2010   Qualifier: Diagnosis of  By: Rockey Situ MD, Tim    . Iron deficiency anemia   . Skin cancer   . TIA (transient ischemic attack) 09/13/2015  . Urinary outflow obstruction    mild    Past Surgical History:  Procedure Laterality Date  . PILONIDAL CYST EXCISION  1951   removal  . [place]  . SKIN CANCER EXCISION     multiple  . SQUAMOUS CELL CARCINOMA EXCISION     behind head  . TONSILLECTOMY AND ADENOIDECTOMY  1938    There were no vitals filed for this visit.      Subjective Assessment - 2017-02-10 0844    Subjective Patient reports that he is having only soreness in his back but then rates his soreness 4/10. No new complaints. He says that he is able to stand longer with less pain but does still have back soreness and needs to sit down.    Pertinent History Pt reports that he had a right low back muscle strain approximately 6 weeks ago when turning his wife in bed  (wife is bedridden). Pain was initially in the right low back but then he started having some pain on the left side of his back as well. Pt reports that he used capsaicin and lidocaine patches which helped with the pain. Pain has been gradually improving and he is only currently having soreness in his bilateral low back. Approximately 1 year ago he was diagnosed with myasthenia gravis. He went to see [place] neurology and had immunoglobulin therapy. Reports 85-90% improvement in symptoms since that time. Pt reports that he has also has a "disc problem" in his lower back with pain that runs down his L posterior thigh. He has been seeing a chiropractor for this problem for the last 4-5 years. Denies radicular symptoms related to current episode. Denies N/T or bowel/bladder changes. Pain was initially waking pt from sleep but not currently. Remote history of skin cancer. ROS negative for red flags.    Limitations Standing   How long can you stand comfortably? 15 minutes   Patient Stated Goals to be able to stand longer.   Currently in Pain? Yes   Pain Score 4    Pain Location Back   Pain Orientation Lower   Pain Descriptors / Indicators Sore   Pain Type Chronic pain   Pain Radiating Towards low back   Pain Onset  More than a month ago   Pain Frequency Constant   Aggravating Factors  standing   Pain Relieving Factors sitting   Effect of Pain on Daily Activities hard to do dishes   Multiple Pain Sites No        Therapeutic exercise:  Hooklying hip abd/ER x 15 x 2 BLE with TA Standing trunk extension x 10 x 3 sets Single leg to chest  X 30 sec x 3 Bridging x 10 x 1 Trunk rotation left and right x 20 with TA Standing trunk rotation with TA x 20 x 2 left and right Standing shoulder flex x 20 with TA hooklying with TA and marching x 20 x 2 Prone trunk extension x 10 x 3 Patient needs cues for correct posture and correct technique and to perform exercises slowly and keeping his TA tight.     Patient has no decrease of soreness in low back from 3/10 . ( goal #2) Patient is independent with HEP ( goal #1) Patient can turn his wife with 4/10 pain and no increase ( goal #3) Patient can stand for 15 minutes at a time ( goal #4)                           PT Education - 01/28/17 0845    Education provided Yes   Education Details heat and HEP   Person(s) Educated Patient   Methods Explanation   Comprehension Verbalized understanding             PT Long Term Goals - 01/28/17 0849      PT LONG TERM GOAL #1   Title Pt will be independent with HEP in order to improve strength and balance in order to decrease fall risk and improve function at home and work.   Time 6   Period Weeks   Status Achieved     PT LONG TERM GOAL #2   Title Pt will report worst pain as 6/10 on NPRS in order to demonstrate clinically significant reduction in pain   Baseline 3/10   Time 6   Period Weeks   Status Achieved     PT LONG TERM GOAL #3   Title Pt will report ability to help turn wife in bed without increase in low back pain   Baseline continues to have 4/10 pain   Time 6   Period Weeks   Status Not Met     PT LONG TERM GOAL #4   Title Pt will be able to stand for at least 30 minutes before needing to sit due to back pain   Baseline 15 mins   Time 6   Period Weeks   Status Not Met               Plan - 01/28/17 0846    Clinical Impression Statement Patient instructed in core strengthening and stretching for low back and stability exercises. He has reached goals and is independent with HEp. He was instructed in body mechanics for reaching down to the floor and for lifting and moving his bed ridden wife. he is being DC today form PT.    Rehab Potential Good   Clinical Impairments Affecting Rehab Potential positive: improving symptoms, motivation; negative: age   PT Frequency 2x / week   PT Duration 6 weeks   PT Treatment/Interventions Aquatic  Therapy;Cryotherapy;Electrical Stimulation;Iontophoresis 25m/ml Dexamethasone;Moist Heat;Traction;Ultrasound;Gait training;Therapeutic activities;Therapeutic exercise;Balance training;Neuromuscular re-education;Patient/family education;Manual techniques;Passive range of motion;Dry needling  PT Next Visit Plan   pelvic tilts, progres strengthening techniques for lumbar strain   PT Home Exercise Plan Hooklying single knee to chest 30s bilateral x 3, 3 times/day; Hooklying lumbar rotation stretch 30s bilateral x 3, 3 times/day   Consulted and Agree with Plan of Care Patient      Patient will benefit from skilled therapeutic intervention in order to improve the following deficits and impairments:  Decreased strength, Pain, Decreased range of motion, Abnormal gait  Visit Diagnosis: Acute bilateral low back pain without sciatica  Muscle weakness (generalized)       G-Codes - February 04, 2017 0851    Functional Assessment Tool Used (Outpatient Only) clinical judgement, NPRS   Functional Limitation Mobility: Walking and moving around   Mobility: Walking and Moving Around Goal Status [phone]) At least 20 percent but less than 40 percent impaired, limited or restricted   Mobility: Walking and Moving Around Discharge Status [phone]) At least 20 percent but less than 40 percent impaired, limited or restricted      Problem List Patient Active Problem List   Diagnosis Date Noted  . Chronic right-sided low back pain without sciatica 10/03/2016  . BPH (benign prostatic hyperplasia) 07/01/2016  . Urinary frequency 01/08/2016  . Myasthenia gravis ([place]) 10/09/2015  . Other symptoms and signs involving the musculoskeletal system 09/26/2015  . Binocular vision disorder with diplopia 09/26/2015  . Fatigue 09/26/2015  . Upper extremity weakness 09/20/2015  . TIA (transient ischemic attack) 09/13/2015  . CVA (cerebral infarction) 09/13/2015  . Sweating 07/20/2015  . Cold feet 07/20/2015  . Health care  maintenance 01/25/2015  . B12 deficiency 11/16/2014  . Obesity (BMI 30-39.9) 09/27/2014  . Anemia 09/27/2014  . Obesity (BMI 30.0-34.9) 05/29/2014  . Hyponatremia 09/20/2013  . Diabetes mellitus ([place]) 12/06/2011  . Hypercholesterolemia 08/15/2010  . HYPERTENSION, BENIGN 08/15/2010  . PALPITATIONS, OCCASIONAL 08/15/2010   Alanson Puls, PT, DPT [place], [place] S February 04, 2017, 9:12 AM  [place] MAIN [facility] SERVICES [address] [place], [place], [address] Phone: [phone]   Fax:  [phone]  Name: [name] MRN: [id number] Date of Birth: 15-Feb-1926

## 2017-01-30 DIAGNOSIS — M9903 Segmental and somatic dysfunction of lumbar region: Secondary | ICD-10-CM | POA: Diagnosis not present

## 2017-01-30 DIAGNOSIS — M9905 Segmental and somatic dysfunction of pelvic region: Secondary | ICD-10-CM | POA: Diagnosis not present

## 2017-01-30 DIAGNOSIS — M955 Acquired deformity of pelvis: Secondary | ICD-10-CM | POA: Diagnosis not present

## 2017-01-30 DIAGNOSIS — M5136 Other intervertebral disc degeneration, lumbar region: Secondary | ICD-10-CM | POA: Diagnosis not present

## 2017-01-31 ENCOUNTER — Encounter: Payer: Medicare Other | Admitting: Physical Therapy

## 2017-02-11 DIAGNOSIS — M955 Acquired deformity of pelvis: Secondary | ICD-10-CM | POA: Diagnosis not present

## 2017-02-11 DIAGNOSIS — M9905 Segmental and somatic dysfunction of pelvic region: Secondary | ICD-10-CM | POA: Diagnosis not present

## 2017-02-11 DIAGNOSIS — M9903 Segmental and somatic dysfunction of lumbar region: Secondary | ICD-10-CM | POA: Diagnosis not present

## 2017-02-11 DIAGNOSIS — M5136 Other intervertebral disc degeneration, lumbar region: Secondary | ICD-10-CM | POA: Diagnosis not present

## 2017-02-12 ENCOUNTER — Ambulatory Visit (INDEPENDENT_AMBULATORY_CARE_PROVIDER_SITE_OTHER): Payer: Medicare Other | Admitting: Internal Medicine

## 2017-02-12 ENCOUNTER — Encounter: Payer: Self-pay | Admitting: Internal Medicine

## 2017-02-12 DIAGNOSIS — G8929 Other chronic pain: Secondary | ICD-10-CM | POA: Diagnosis not present

## 2017-02-12 DIAGNOSIS — M545 Low back pain, unspecified: Secondary | ICD-10-CM

## 2017-02-12 DIAGNOSIS — E7800 Pure hypercholesterolemia, unspecified: Secondary | ICD-10-CM | POA: Diagnosis not present

## 2017-02-12 DIAGNOSIS — E119 Type 2 diabetes mellitus without complications: Secondary | ICD-10-CM | POA: Diagnosis not present

## 2017-02-12 DIAGNOSIS — E538 Deficiency of other specified B group vitamins: Secondary | ICD-10-CM

## 2017-02-12 DIAGNOSIS — I10 Essential (primary) hypertension: Secondary | ICD-10-CM | POA: Diagnosis not present

## 2017-02-12 DIAGNOSIS — Z0000 Encounter for general adult medical examination without abnormal findings: Secondary | ICD-10-CM | POA: Diagnosis not present

## 2017-02-12 DIAGNOSIS — E669 Obesity, unspecified: Secondary | ICD-10-CM | POA: Diagnosis not present

## 2017-02-12 DIAGNOSIS — G7000 Myasthenia gravis without (acute) exacerbation: Secondary | ICD-10-CM

## 2017-02-12 MED ORDER — CYANOCOBALAMIN 1000 MCG/ML IJ SOLN
1000.0000 ug | Freq: Once | INTRAMUSCULAR | Status: AC
  Administered 2017-02-12: 1000 ug via INTRAMUSCULAR

## 2017-02-12 NOTE — Progress Notes (Addendum)
Patient ID: [name], male   DOB: 1926-08-11, 83 y.o.   MRN: [id number]   Subjective:    Patient ID: [name], male    DOB: September 22, 1926, 83 y.o.   MRN: [id number]  HPI  Patient with past history of myasthenia gravis and diabetes.  He comes in today to follow up on these issues as well as for a complete physical exam.  Continues in cellcept and mestinon.  Off prednisone.  Taking cellcept.  Stable.  He is under increased stress with his wife's medical issues.  Overall he feels he is handling things relatively well.  No chest pain.  Breathing stable.  No acid reflux.  No abdominal pain.  Bowels moving.  Does report increased back soreness.  If stands a lon period of time will notice discomfort in his low back and down left leg.  No urine or bowel change.  Blood sugars averaging 120-130s.     Past Medical History:  Diagnosis Date  . Allergic rhinitis   . BPH (benign prostatic hypertrophy)   . Degenerative arthritis   . Diabetes mellitus ([place])   . GERD (gastroesophageal reflux disease)   . HTN (hypertension)   . Hypercholesterolemia   . HYPERTENSION, BENIGN 08/15/2010   Qualifier: Diagnosis of  By: Rockey Situ MD, Tim    . Iron deficiency anemia   . Skin cancer   . TIA (transient ischemic attack) 09/13/2015  . Urinary outflow obstruction    mild   Past Surgical History:  Procedure Laterality Date  . PILONIDAL CYST EXCISION  1951   removal  . [place]  . SKIN CANCER EXCISION     multiple  . SQUAMOUS CELL CARCINOMA EXCISION     behind head  . TONSILLECTOMY AND ADENOIDECTOMY  1938   Family History  Problem Relation Age of Onset  . Heart disease Father     heart atack  . Alzheimer's disease Mother   . CVA Brother    Social History   Social History  . Marital status: Married    Spouse name: N/A  . Number of children: N/A  . Years of education: N/A   Social History Main Topics  . Smoking status: Never Smoker  . Smokeless tobacco: Never Used    . Alcohol use No  . Drug use: No  . Sexual activity: Not Asked   Other Topics Concern  . None   Social History Narrative   Retired, married. Walks everyday          Outpatient Encounter Prescriptions as of 02/12/2017  Medication Sig  . ACCU-CHEK SOFTCLIX LANCETS lancets Use as instructed  . aspirin (ASPIR-LOW) 81 MG EC tablet Take 81 mg by mouth daily.    . calcium-vitamin D (OSCAL 500/200 D-3) 500-200 MG-UNIT per tablet Take 1 tablet by mouth 2 (two) times daily.   . cyanocobalamin (,VITAMIN B-12,) 1000 MCG/ML injection Inject 1,000 mcg into the muscle every 30 (thirty) days.  Marland Kitchen docusate sodium (COLACE) 100 MG capsule Take 100 mg by mouth 2 (two) times daily. Reported on 05/18/2016  . finasteride (PROSCAR) 5 MG tablet Take 1 tablet (5 mg total) by mouth daily.  Marland Kitchen glucose blood test strip Use once daily to check blood sugars Dx 250.00 Accu-check Compact plus strips  . lidocaine (LIDODERM) 5 % Place 1 patch onto the skin every 12 (twelve) hours. Remove & Discard patch within 12 hours.  Give yourself about 12 hours between patches.  . lovastatin (MEVACOR) 20 MG tablet  TAKE ONE TABLET AT BEDTIME  . mycophenolate (CELLCEPT) 500 MG tablet Take 500 mg by mouth 2 (two) times daily.   Marland Kitchen pyridostigmine (MESTINON) 60 MG tablet Take 1/2 tablet tid (Patient taking differently: Take 60 mg by mouth 3 (three) times daily. Take 1/2 tablet tid)  . tamsulosin (FLOMAX) 0.4 MG CAPS capsule Take 1 capsule (0.4 mg total) by mouth 2 (two) times daily.  . valsartan-hydrochlorothiazide (DIOVAN-HCT) 320-12.5 MG tablet TAKE ONE (1) TABLET EACH DAY  . [EXPIRED] cyanocobalamin ((VITAMIN B-12)) injection 1,000 mcg    No facility-administered encounter medications on file as of 02/12/2017.     Review of Systems  Constitutional: Negative for appetite change and unexpected weight change.  HENT: Negative for congestion and sinus pressure.   Eyes: Negative for pain and visual disturbance.  Respiratory: Negative  for cough, chest tightness and shortness of breath.   Cardiovascular: Negative for chest pain, palpitations and leg swelling.  Gastrointestinal: Negative for abdominal pain, diarrhea, nausea and vomiting.  Genitourinary: Negative for difficulty urinating and dysuria.  Musculoskeletal: Positive for back pain. Negative for joint swelling.  Skin: Negative for color change and rash.  Neurological: Negative for dizziness, light-headedness and headaches.  Hematological: Negative for adenopathy. Does not bruise/bleed easily.  Psychiatric/Behavioral: Negative for agitation and dysphoric mood.       Objective:    Physical Exam  Constitutional: He is oriented to person, place, and time. He appears well-developed and well-nourished. No distress.  HENT:  Head: Normocephalic and atraumatic.  Nose: Nose normal.  Mouth/Throat: Oropharynx is clear and moist. No oropharyngeal exudate.  Eyes: Conjunctivae are normal. Right eye exhibits no discharge. Left eye exhibits no discharge.  Neck: Neck supple. No thyromegaly present.  Cardiovascular: Normal rate and regular rhythm.   Pulmonary/Chest: Breath sounds normal. No respiratory distress. He has no wheezes.  Abdominal: Soft. Bowel sounds are normal. There is no tenderness.  Genitourinary: Rectum normal and penis normal.  Musculoskeletal: He exhibits no edema or tenderness.  Lymphadenopathy:    He has no cervical adenopathy.  Neurological: He is alert and oriented to person, place, and time.  Foot - Decreased sensation distal foot.  Improves as move up foot and ankle/leg.  No ulcerations.    Skin: Skin is warm and dry. No rash noted. No erythema.  Psychiatric: He has a normal mood and affect. His behavior is normal.    BP 138/64 (BP Location: Left Arm, Patient Position: Sitting, Cuff Size: Normal)   Pulse 67   Temp 97.5 F (36.4 C) (Oral)   Resp 16   Ht 5' 9.75" (1.772 m)   Wt 225 lb (102.1 kg)   SpO2 98%   BMI 32.52 kg/m  Wt Readings from  Last 3 Encounters:  02/12/17 225 lb (102.1 kg)  10/23/16 227 lb 3.2 oz (103.1 kg)  10/03/16 228 lb 12.8 oz (103.8 kg)     Lab Results  Component Value Date   WBC 7.1 12/13/2016   HGB 12.6 (L) 12/13/2016   HCT 38.0 (L) 12/13/2016   PLT 309.0 12/13/2016   GLUCOSE 149 (H) 11/28/2016   CHOL 175 11/28/2016   TRIG 121.0 11/28/2016   HDL 57.40 11/28/2016   LDLCALC 93 11/28/2016   ALT 16 11/28/2016   AST 18 11/28/2016   NA 137 12/13/2016   K 4.5 11/28/2016   CL 97 11/28/2016   CREATININE 0.88 11/28/2016   BUN 19 11/28/2016   CO2 30 11/28/2016   TSH 1.72 03/23/2016   PSA 0.52 07/07/2015  HGBA1C 7.3 (H) 11/28/2016   MICROALBUR 1.4 03/23/2016       Assessment & Plan:   Problem List Items Addressed This Visit    B12 deficiency    Continue B12 injections.        Relevant Medications   cyanocobalamin ((VITAMIN B-12)) injection 1,000 mcg (Completed)   Chronic right-sided low back pain without sciatica    Back pain worse with standing long periods of time.  Now involving left leg more.  Desires no further evaluation at this time.  Wants to hold on xray.  Will notify me if desires xray.       Diabetes mellitus ([place])    Low carb diet and exercise.  Follow met b and a1c.  Off prednisone.        Relevant Orders   Hemoglobin C4Q   Basic metabolic panel   Microalbumin / creatinine urine ratio   Health care maintenance    Physical today 02/12/17.        Hypercholesterolemia    On lovastatin.  Follow lipid panel and liver function tests.        Relevant Orders   Hepatic function panel   Lipid panel   HYPERTENSION, BENIGN    Blood pressure under good control.  Continue same medication regimen.  Follow pressures.  Follow metabolic panel.        Relevant Orders   TSH   Myasthenia gravis ([place])    Followed by Dr Chester Holstein.  Stable on current regimen.  Follow.  Off prednisone.        Relevant Orders   CBC with Differential/Platelet   Obesity (BMI 30.0-34.9)    Diet and  exercise.  Follow.           Einar Pheasant, MD

## 2017-02-12 NOTE — Progress Notes (Signed)
Pre visit review using our clinic review tool, if applicable. No additional management support is needed unless otherwise documented below in the visit note. 

## 2017-02-17 ENCOUNTER — Encounter: Payer: Self-pay | Admitting: Internal Medicine

## 2017-02-17 NOTE — Assessment & Plan Note (Signed)
Back pain worse with standing long periods of time.  Now involving left leg more.  Desires no further evaluation at this time.  Wants to hold on xray.  Will notify me if desires xray.

## 2017-02-17 NOTE — Assessment & Plan Note (Signed)
Diet and exercise.  Follow.  

## 2017-02-17 NOTE — Assessment & Plan Note (Signed)
Low carb diet and exercise.  Follow met b and a1c.  Off prednisone.

## 2017-02-17 NOTE — Assessment & Plan Note (Signed)
On lovastatin.  Follow lipid panel and liver function tests.   

## 2017-02-17 NOTE — Assessment & Plan Note (Signed)
Blood pressure under good control.  Continue same medication regimen.  Follow pressures.  Follow metabolic panel.   

## 2017-02-17 NOTE — Assessment & Plan Note (Signed)
Followed by Dr Chester Holstein.  Stable on current regimen.  Follow.  Off prednisone.

## 2017-02-17 NOTE — Assessment & Plan Note (Addendum)
Physical today 02/12/17.

## 2017-02-17 NOTE — Assessment & Plan Note (Signed)
Continue B12 injections.   

## 2017-02-18 ENCOUNTER — Encounter: Payer: Self-pay | Admitting: Internal Medicine

## 2017-02-19 DIAGNOSIS — M9903 Segmental and somatic dysfunction of lumbar region: Secondary | ICD-10-CM | POA: Diagnosis not present

## 2017-02-19 DIAGNOSIS — M5136 Other intervertebral disc degeneration, lumbar region: Secondary | ICD-10-CM | POA: Diagnosis not present

## 2017-02-19 DIAGNOSIS — M9905 Segmental and somatic dysfunction of pelvic region: Secondary | ICD-10-CM | POA: Diagnosis not present

## 2017-02-19 DIAGNOSIS — M955 Acquired deformity of pelvis: Secondary | ICD-10-CM | POA: Diagnosis not present

## 2017-02-19 NOTE — Telephone Encounter (Signed)
Foot exam in physical on 02/12/17 appt.

## 2017-02-19 NOTE — Telephone Encounter (Signed)
Form completed and placed in box.  One section I do not know what to check.  See note.  Can call [facility] and see what to check.

## 2017-02-26 DIAGNOSIS — M9903 Segmental and somatic dysfunction of lumbar region: Secondary | ICD-10-CM | POA: Diagnosis not present

## 2017-02-26 DIAGNOSIS — M9905 Segmental and somatic dysfunction of pelvic region: Secondary | ICD-10-CM | POA: Diagnosis not present

## 2017-02-26 DIAGNOSIS — M5136 Other intervertebral disc degeneration, lumbar region: Secondary | ICD-10-CM | POA: Diagnosis not present

## 2017-02-26 DIAGNOSIS — M955 Acquired deformity of pelvis: Secondary | ICD-10-CM | POA: Diagnosis not present

## 2017-03-12 DIAGNOSIS — M9905 Segmental and somatic dysfunction of pelvic region: Secondary | ICD-10-CM | POA: Diagnosis not present

## 2017-03-12 DIAGNOSIS — M955 Acquired deformity of pelvis: Secondary | ICD-10-CM | POA: Diagnosis not present

## 2017-03-12 DIAGNOSIS — M9903 Segmental and somatic dysfunction of lumbar region: Secondary | ICD-10-CM | POA: Diagnosis not present

## 2017-03-12 DIAGNOSIS — M5136 Other intervertebral disc degeneration, lumbar region: Secondary | ICD-10-CM | POA: Diagnosis not present

## 2017-03-16 ENCOUNTER — Telehealth: Payer: Self-pay | Admitting: Internal Medicine

## 2017-03-16 NOTE — Telephone Encounter (Signed)
Pt will call back to schedule AWV °

## 2017-03-18 DIAGNOSIS — M955 Acquired deformity of pelvis: Secondary | ICD-10-CM | POA: Diagnosis not present

## 2017-03-18 DIAGNOSIS — M9905 Segmental and somatic dysfunction of pelvic region: Secondary | ICD-10-CM | POA: Diagnosis not present

## 2017-03-18 DIAGNOSIS — M9903 Segmental and somatic dysfunction of lumbar region: Secondary | ICD-10-CM | POA: Diagnosis not present

## 2017-03-18 DIAGNOSIS — M5136 Other intervertebral disc degeneration, lumbar region: Secondary | ICD-10-CM | POA: Diagnosis not present

## 2017-03-19 ENCOUNTER — Ambulatory Visit: Payer: Medicare Other

## 2017-03-20 ENCOUNTER — Ambulatory Visit (INDEPENDENT_AMBULATORY_CARE_PROVIDER_SITE_OTHER): Payer: Medicare Other | Admitting: *Deleted

## 2017-03-20 DIAGNOSIS — E538 Deficiency of other specified B group vitamins: Secondary | ICD-10-CM | POA: Diagnosis not present

## 2017-03-20 MED ORDER — CYANOCOBALAMIN 1000 MCG/ML IJ SOLN
1000.0000 ug | Freq: Once | INTRAMUSCULAR | Status: AC
  Administered 2017-03-20: 1000 ug via INTRAMUSCULAR

## 2017-03-20 NOTE — Progress Notes (Addendum)
Patient presented for B 12 injection to right deltoid, patient voiced no concern nor showed any signs of distress during injection.   Reviewed.    Dr Nicki Reaper

## 2017-03-29 DIAGNOSIS — G7000 Myasthenia gravis without (acute) exacerbation: Secondary | ICD-10-CM | POA: Diagnosis not present

## 2017-03-29 DIAGNOSIS — M5136 Other intervertebral disc degeneration, lumbar region: Secondary | ICD-10-CM | POA: Diagnosis not present

## 2017-03-29 DIAGNOSIS — M545 Low back pain: Secondary | ICD-10-CM | POA: Diagnosis not present

## 2017-03-29 DIAGNOSIS — G8929 Other chronic pain: Secondary | ICD-10-CM | POA: Diagnosis not present

## 2017-04-02 DIAGNOSIS — M9903 Segmental and somatic dysfunction of lumbar region: Secondary | ICD-10-CM | POA: Diagnosis not present

## 2017-04-02 DIAGNOSIS — M955 Acquired deformity of pelvis: Secondary | ICD-10-CM | POA: Diagnosis not present

## 2017-04-02 DIAGNOSIS — M9905 Segmental and somatic dysfunction of pelvic region: Secondary | ICD-10-CM | POA: Diagnosis not present

## 2017-04-02 DIAGNOSIS — M5136 Other intervertebral disc degeneration, lumbar region: Secondary | ICD-10-CM | POA: Diagnosis not present

## 2017-04-15 DIAGNOSIS — D692 Other nonthrombocytopenic purpura: Secondary | ICD-10-CM | POA: Diagnosis not present

## 2017-04-15 DIAGNOSIS — L578 Other skin changes due to chronic exposure to nonionizing radiation: Secondary | ICD-10-CM | POA: Diagnosis not present

## 2017-04-15 DIAGNOSIS — L821 Other seborrheic keratosis: Secondary | ICD-10-CM | POA: Diagnosis not present

## 2017-04-15 DIAGNOSIS — L812 Freckles: Secondary | ICD-10-CM | POA: Diagnosis not present

## 2017-04-15 DIAGNOSIS — Z85828 Personal history of other malignant neoplasm of skin: Secondary | ICD-10-CM | POA: Diagnosis not present

## 2017-04-15 DIAGNOSIS — L570 Actinic keratosis: Secondary | ICD-10-CM | POA: Diagnosis not present

## 2017-04-16 DIAGNOSIS — M9903 Segmental and somatic dysfunction of lumbar region: Secondary | ICD-10-CM | POA: Diagnosis not present

## 2017-04-16 DIAGNOSIS — M9905 Segmental and somatic dysfunction of pelvic region: Secondary | ICD-10-CM | POA: Diagnosis not present

## 2017-04-16 DIAGNOSIS — M5136 Other intervertebral disc degeneration, lumbar region: Secondary | ICD-10-CM | POA: Diagnosis not present

## 2017-04-16 DIAGNOSIS — M955 Acquired deformity of pelvis: Secondary | ICD-10-CM | POA: Diagnosis not present

## 2017-04-24 ENCOUNTER — Ambulatory Visit (INDEPENDENT_AMBULATORY_CARE_PROVIDER_SITE_OTHER): Payer: Medicare Other | Admitting: *Deleted

## 2017-04-24 DIAGNOSIS — E538 Deficiency of other specified B group vitamins: Secondary | ICD-10-CM | POA: Diagnosis not present

## 2017-04-24 MED ORDER — CYANOCOBALAMIN 1000 MCG/ML IJ SOLN
1000.0000 ug | Freq: Once | INTRAMUSCULAR | Status: AC
  Administered 2017-04-24: 1000 ug via INTRAMUSCULAR

## 2017-04-24 NOTE — Progress Notes (Signed)
Patient presented for B 12 injection to left deltoid, patient voiced no concerns nor showed any signs of distress during injection. 

## 2017-04-30 ENCOUNTER — Other Ambulatory Visit: Payer: Self-pay | Admitting: Internal Medicine

## 2017-05-14 DIAGNOSIS — M955 Acquired deformity of pelvis: Secondary | ICD-10-CM | POA: Diagnosis not present

## 2017-05-14 DIAGNOSIS — M9905 Segmental and somatic dysfunction of pelvic region: Secondary | ICD-10-CM | POA: Diagnosis not present

## 2017-05-14 DIAGNOSIS — M9903 Segmental and somatic dysfunction of lumbar region: Secondary | ICD-10-CM | POA: Diagnosis not present

## 2017-05-14 DIAGNOSIS — M5136 Other intervertebral disc degeneration, lumbar region: Secondary | ICD-10-CM | POA: Diagnosis not present

## 2017-05-28 ENCOUNTER — Other Ambulatory Visit: Payer: Self-pay

## 2017-05-29 ENCOUNTER — Ambulatory Visit (INDEPENDENT_AMBULATORY_CARE_PROVIDER_SITE_OTHER): Payer: Medicare Other | Admitting: *Deleted

## 2017-05-29 DIAGNOSIS — E538 Deficiency of other specified B group vitamins: Secondary | ICD-10-CM | POA: Diagnosis not present

## 2017-05-29 MED ORDER — CYANOCOBALAMIN 1000 MCG/ML IJ SOLN
1000.0000 ug | Freq: Once | INTRAMUSCULAR | Status: AC
  Administered 2017-05-29: 1000 ug via INTRAMUSCULAR

## 2017-05-29 NOTE — Progress Notes (Addendum)
Patient presented for B 12 injection to right deltoid, patient voiced no concerns nor showed any signs of distress during injection.  Reviewed.  Dr Scott 

## 2017-06-11 DIAGNOSIS — M62830 Muscle spasm of back: Secondary | ICD-10-CM | POA: Diagnosis not present

## 2017-06-11 DIAGNOSIS — M9901 Segmental and somatic dysfunction of cervical region: Secondary | ICD-10-CM | POA: Diagnosis not present

## 2017-06-11 DIAGNOSIS — M5033 Other cervical disc degeneration, cervicothoracic region: Secondary | ICD-10-CM | POA: Diagnosis not present

## 2017-06-11 DIAGNOSIS — M9902 Segmental and somatic dysfunction of thoracic region: Secondary | ICD-10-CM | POA: Diagnosis not present

## 2017-06-12 ENCOUNTER — Other Ambulatory Visit (INDEPENDENT_AMBULATORY_CARE_PROVIDER_SITE_OTHER): Payer: Medicare Other

## 2017-06-12 ENCOUNTER — Encounter: Payer: Self-pay | Admitting: Internal Medicine

## 2017-06-12 DIAGNOSIS — G7000 Myasthenia gravis without (acute) exacerbation: Secondary | ICD-10-CM | POA: Diagnosis not present

## 2017-06-12 DIAGNOSIS — I10 Essential (primary) hypertension: Secondary | ICD-10-CM

## 2017-06-12 DIAGNOSIS — E7800 Pure hypercholesterolemia, unspecified: Secondary | ICD-10-CM

## 2017-06-12 DIAGNOSIS — E119 Type 2 diabetes mellitus without complications: Secondary | ICD-10-CM

## 2017-06-12 LAB — BASIC METABOLIC PANEL
BUN: 18 mg/dL (ref 6–23)
CHLORIDE: 97 meq/L (ref 96–112)
CO2: 30 meq/L (ref 19–32)
CREATININE: 0.81 mg/dL (ref 0.40–1.50)
Calcium: 9.7 mg/dL (ref 8.4–10.5)
GFR: 95.43 mL/min (ref >60.00)
GLUCOSE: 158 mg/dL — AB (ref 70–99)
Potassium: 5 mEq/L (ref 3.5–5.1)
Sodium: 132 mEq/L — ABNORMAL LOW (ref 135–145)

## 2017-06-12 LAB — HEPATIC FUNCTION PANEL
ALBUMIN: 4 g/dL (ref 3.5–5.2)
ALT: 18 U/L (ref 0–53)
AST: 19 U/L (ref 0–37)
Alkaline Phosphatase: 32 U/L — ABNORMAL LOW (ref 39–117)
Bilirubin, Direct: 0.1 mg/dL (ref 0.0–0.3)
Total Bilirubin: 0.8 mg/dL (ref 0.2–1.2)
Total Protein: 7.3 g/dL (ref 6.0–8.3)

## 2017-06-12 LAB — CBC WITH DIFFERENTIAL/PLATELET
BASOS PCT: 1 % (ref 0.0–3.0)
Basophils Absolute: 0.1 10*3/uL (ref 0.0–0.1)
EOS PCT: 2.7 % (ref 0.0–5.0)
Eosinophils Absolute: 0.1 10*3/uL (ref 0.0–0.7)
HCT: 37.2 % — ABNORMAL LOW (ref 39.0–52.0)
Hemoglobin: 12.4 g/dL — ABNORMAL LOW (ref 13.0–17.0)
LYMPHS ABS: 1.1 10*3/uL (ref 0.7–4.0)
Lymphocytes Relative: 21.4 % (ref 12.0–46.0)
MCHC: 33.5 g/dL (ref 30.0–36.0)
MCV: 93.1 fl (ref 78.0–100.0)
MONO ABS: 0.5 10*3/uL (ref 0.1–1.0)
Monocytes Relative: 9.9 % (ref 3.0–12.0)
NEUTROS ABS: 3.4 10*3/uL (ref 1.4–7.7)
NEUTROS PCT: 65 % (ref 43.0–77.0)
PLATELETS: 297 10*3/uL (ref 150.0–400.0)
RBC: 3.99 Mil/uL — ABNORMAL LOW (ref 4.22–5.81)
RDW: 12.7 % (ref 11.5–15.5)
WBC: 5.3 10*3/uL (ref 4.0–10.5)

## 2017-06-12 LAB — HEMOGLOBIN A1C: HEMOGLOBIN A1C: 7.4 % — AB (ref 4.6–6.5)

## 2017-06-12 LAB — MICROALBUMIN / CREATININE URINE RATIO
CREATININE, U: 89.3 mg/dL
MICROALB/CREAT RATIO: 2.2 mg/g (ref 0.0–30.0)
Microalb, Ur: 2 mg/dL — ABNORMAL HIGH (ref 0.0–1.9)

## 2017-06-12 LAB — LIPID PANEL
CHOLESTEROL: 142 mg/dL (ref 0–200)
HDL: 50.8 mg/dL (ref >39.00)
LDL Cholesterol: 71 mg/dL (ref 0–99)
NonHDL: 90.84
TRIGLYCERIDES: 99 mg/dL (ref 0.0–149.0)
Total CHOL/HDL Ratio: 3
VLDL: 19.8 mg/dL (ref 0.0–40.0)

## 2017-06-12 LAB — TSH: TSH: 2.68 u[IU]/mL (ref 0.35–4.50)

## 2017-06-14 ENCOUNTER — Ambulatory Visit (INDEPENDENT_AMBULATORY_CARE_PROVIDER_SITE_OTHER): Payer: Medicare Other | Admitting: Internal Medicine

## 2017-06-14 ENCOUNTER — Encounter: Payer: Self-pay | Admitting: Internal Medicine

## 2017-06-14 VITALS — BP 136/62 | HR 75 | Temp 98.8°F | Resp 12 | Ht 70.0 in | Wt 232.2 lb

## 2017-06-14 DIAGNOSIS — G8929 Other chronic pain: Secondary | ICD-10-CM | POA: Diagnosis not present

## 2017-06-14 DIAGNOSIS — E669 Obesity, unspecified: Secondary | ICD-10-CM

## 2017-06-14 DIAGNOSIS — G7000 Myasthenia gravis without (acute) exacerbation: Secondary | ICD-10-CM

## 2017-06-14 DIAGNOSIS — E871 Hypo-osmolality and hyponatremia: Secondary | ICD-10-CM

## 2017-06-14 DIAGNOSIS — E119 Type 2 diabetes mellitus without complications: Secondary | ICD-10-CM

## 2017-06-14 DIAGNOSIS — E538 Deficiency of other specified B group vitamins: Secondary | ICD-10-CM

## 2017-06-14 DIAGNOSIS — M545 Low back pain: Secondary | ICD-10-CM | POA: Diagnosis not present

## 2017-06-14 DIAGNOSIS — E7800 Pure hypercholesterolemia, unspecified: Secondary | ICD-10-CM

## 2017-06-14 DIAGNOSIS — I10 Essential (primary) hypertension: Secondary | ICD-10-CM | POA: Diagnosis not present

## 2017-06-14 LAB — SODIUM: SODIUM: 131 meq/L — AB (ref 135–145)

## 2017-06-14 LAB — HM DIABETES FOOT EXAM

## 2017-06-14 MED ORDER — LOSARTAN POTASSIUM-HCTZ 100-12.5 MG PO TABS: 1.0000 | ORAL_TABLET | Freq: Every day | ORAL | 3 refills | Status: DC

## 2017-06-14 NOTE — Progress Notes (Signed)
Patient ID: [name], male   DOB: 08/12/1926, 83 y.o.   MRN: [id number]   Subjective:    Patient ID: [name], male    DOB: [birth year], 83 y.o.   MRN: [id number]  HPI  Patient here for a scheduled follow up.  Followed by Dr Earle Gell for his myasthenia gravis.  Recently stopped mestinon.  Remains on cellcept.  Has f/u next week.  Does report noticing a little more drooling.  Also reports has noticed he gets a little strangled when he swallows (i.e., water,etc).  Some increased coughing.  Discussed further w/up.  Discussed swallowing evaluation.  He declines.  Wants to discuss with Dr Earle Gell prior to any w/up.  Back is dong better.  No chest pain.  No sob.  No reports of acid reflux.  Discussed if the increased coughing could be related to acid reflux.  No abdominal pain.  Bowels moving.  Still with increased stress taking care of his wife.  Does have sitters.  Overall he feels he is handling things relatively well.  Discussed his lab results.  a1c 7.4.  Sodium 132.  Has varied previously.  Discussed changing his blood pressure medication.  Recall - valsartan.  Will change to losartan/hctz.     Past Medical History:  Diagnosis Date  . Allergic rhinitis   . BPH (benign prostatic hypertrophy)   . Degenerative arthritis   . Diabetes mellitus ([place])   . GERD (gastroesophageal reflux disease)   . HTN (hypertension)   . Hypercholesterolemia   . HYPERTENSION, BENIGN 08/15/2010   Qualifier: Diagnosis of  By: Rockey Situ MD, Tim    . Iron deficiency anemia   . Skin cancer   . TIA (transient ischemic attack) 09/13/2015  . Urinary outflow obstruction    mild   Past Surgical History:  Procedure Laterality Date  . PILONIDAL CYST EXCISION  1951   removal  . [place]  . SKIN CANCER EXCISION     multiple  . SQUAMOUS CELL CARCINOMA EXCISION     behind head  . TONSILLECTOMY AND ADENOIDECTOMY  1938   Family History  Problem Relation Age of Onset  . Heart disease Father         heart atack  . Alzheimer's disease Mother   . CVA Brother    Social History   Social History  . Marital status: Married    Spouse name: N/A  . Number of children: N/A  . Years of education: N/A   Social History Main Topics  . Smoking status: Never Smoker  . Smokeless tobacco: Never Used  . Alcohol use No  . Drug use: No  . Sexual activity: Not Asked   Other Topics Concern  . None   Social History Narrative   Retired, married. Walks everyday          Outpatient Encounter Prescriptions as of 06/14/2017  Medication Sig  . ACCU-CHEK SOFTCLIX LANCETS lancets Use as instructed  . aspirin (ASPIR-LOW) 81 MG EC tablet Take 81 mg by mouth daily.    . calcium-vitamin D (OSCAL 500/200 D-3) 500-200 MG-UNIT per tablet Take 1 tablet by mouth 2 (two) times daily.   . cyanocobalamin (,VITAMIN B-12,) 1000 MCG/ML injection Inject 1,000 mcg into the muscle every 30 (thirty) days.  Marland Kitchen docusate sodium (COLACE) 100 MG capsule Take 100 mg by mouth 2 (two) times daily. Reported on 05/18/2016  . finasteride (PROSCAR) 5 MG tablet Take 1 tablet (5 mg total) by mouth daily.  Marland Kitchen  glucose blood test strip Use once daily to check blood sugars Dx 250.00 Accu-check Compact plus strips  . lovastatin (MEVACOR) 20 MG tablet TAKE ONE TABLET AT BEDTIME  . mycophenolate (CELLCEPT) 500 MG tablet Take 500 mg by mouth 2 (two) times daily. 1000 mg bid  . pyridostigmine (MESTINON) 60 MG tablet Take 1/2 tablet tid (Patient taking differently: Take 60 mg by mouth 3 (three) times daily. Take 1/2 tablet tid)  . tamsulosin (FLOMAX) 0.4 MG CAPS capsule Take 1 capsule (0.4 mg total) by mouth 2 (two) times daily.  . [DISCONTINUED] lidocaine (LIDODERM) 5 % Place 1 patch onto the skin every 12 (twelve) hours. Remove & Discard patch within 12 hours.  Give yourself about 12 hours between patches.  . [DISCONTINUED] valsartan-hydrochlorothiazide (DIOVAN-HCT) 320-12.5 MG tablet TAKE ONE (1) TABLET EACH DAY  .  losartan-hydrochlorothiazide (HYZAAR) 100-12.5 MG tablet Take 1 tablet by mouth daily.   No facility-administered encounter medications on file as of 06/14/2017.     Review of Systems  Constitutional: Negative for appetite change and unexpected weight change.  HENT: Negative for congestion and sinus pressure.   Respiratory: Positive for cough. Negative for chest tightness and shortness of breath.   Cardiovascular: Negative for chest pain, palpitations and leg swelling.  Gastrointestinal: Negative for abdominal pain, nausea and vomiting.       Gets strangled at times as outlined.    Genitourinary: Negative for difficulty urinating and dysuria.  Musculoskeletal: Positive for joint swelling.       Back pain is better.    Skin: Negative for color change and rash.  Neurological: Negative for dizziness, light-headedness and headaches.  Psychiatric/Behavioral: Negative for agitation and dysphoric mood.       Increased stress as outlined.         Objective:     Blood pressure rechecked by me:  132/60  Physical Exam  Constitutional: He appears well-developed and well-nourished. No distress.  HENT:  Nose: Nose normal.  Mouth/Throat: Oropharynx is clear and moist.  Neck: Neck supple. No thyromegaly present.  Cardiovascular: Normal rate and regular rhythm.   Pulmonary/Chest: Effort normal and breath sounds normal. No respiratory distress.  Abdominal: Soft. Bowel sounds are normal. There is no tenderness.  Musculoskeletal: He exhibits no edema or tenderness.  Feet:  No lesions.  DP pulses palpable and equal bilaterally.  Intact to pin prick and light touch.    Lymphadenopathy:    He has no cervical adenopathy.  Skin: No rash noted. No erythema.  Psychiatric: He has a normal mood and affect. His behavior is normal.    BP 136/62 (BP Location: Left Arm, Patient Position: Sitting, Cuff Size: Normal)   Pulse 75   Temp 98.8 F (37.1 C) (Oral)   Resp 12   Ht _0  (1.778 m)   Wt 232 lb  3.2 oz (105.3 kg)   SpO2 97%   BMI 33.32 kg/m  Wt Readings from Last 3 Encounters:  06/14/17 232 lb 3.2 oz (105.3 kg)  02/12/17 225 lb (102.1 kg)  10/23/16 227 lb 3.2 oz (103.1 kg)     Lab Results  Component Value Date   WBC 5.3 06/12/2017   HGB 12.4 (L) 06/12/2017   HCT 37.2 (L) 06/12/2017   PLT 297.0 06/12/2017   GLUCOSE 158 (H) 06/12/2017   CHOL 142 06/12/2017   TRIG 99.0 06/12/2017   HDL 50.80 06/12/2017   LDLCALC 71 06/12/2017   ALT 18 06/12/2017   AST 19 06/12/2017   NA 131 (L)  06/14/2017   K 5.0 06/12/2017   CL 97 06/12/2017   CREATININE 0.81 06/12/2017   BUN 18 06/12/2017   CO2 30 06/12/2017   TSH 2.68 06/12/2017   PSA 0.52 07/07/2015   HGBA1C 7.4 (H) 06/12/2017   MICROALBUR 2.0 (H) 06/12/2017       Assessment & Plan:   Problem List Items Addressed This Visit    B12 deficiency    Continue B12 injections.        Chronic right-sided low back pain without sciatica    Back is some better.  Had xray through Dr Earle Gell.        Diabetes mellitus (HCC)    Recent a1c 7.4.  Continue low carb diet.  Exercise as tolerated.  Follow met b and a1c.  Up to date with eye exams.        Relevant Medications   losartan-hydrochlorothiazide (HYZAAR) 100-12.5 MG tablet   Hypercholesterolemia    On lovastatin.  Follow lipid panel and liver function tests.   Lab Results  Component Value Date   CHOL 142 06/12/2017   HDL 50.80 06/12/2017   LDLCALC 71 06/12/2017   TRIG 99.0 06/12/2017   CHOLHDL 3 06/12/2017        Relevant Medications   losartan-hydrochlorothiazide (HYZAAR) 100-12.5 MG tablet   HYPERTENSION, BENIGN    Blood pressure under good control.  Recall on valsartan.  Will change to losartan/hctz 100/12.5.  Have him spot check his pressure.  Follow.  Notify me of problems.        Relevant Medications   losartan-hydrochlorothiazide (HYZAAR) 100-12.5 MG tablet   Hyponatremia - Primary    Sodium slightly decreased on recent labs.  Will recheck sodium today.          Relevant Orders   Sodium (Completed)   Myasthenia gravis ([place])    Followed by Dr Earle Gell.  Off mestinon.  On cellcept.  Has f/u next week.  Has some concerns regarding increased drooling, getting strangled and some increased cough.  Discussed further evaluation, including swallowing evaluation.  He declines.  He wants to discuss with Dr Earle Gell first.  Will update me regarding further w/up.  Will start zantac to see if helps with cough.        Obesity (BMI 30.0-34.9)    Discussed diet and exercise.            Einar Pheasant, MD

## 2017-06-14 NOTE — Progress Notes (Signed)
Pre-visit discussion using our clinic review tool. No additional management support is needed unless otherwise documented below in the visit note.  

## 2017-06-16 ENCOUNTER — Encounter: Payer: Self-pay | Admitting: Internal Medicine

## 2017-06-16 ENCOUNTER — Other Ambulatory Visit: Payer: Self-pay | Admitting: Internal Medicine

## 2017-06-16 DIAGNOSIS — E871 Hypo-osmolality and hyponatremia: Secondary | ICD-10-CM

## 2017-06-16 NOTE — Assessment & Plan Note (Signed)
Recent a1c 7.4.  Continue low carb diet.  Exercise as tolerated.  Follow met b and a1c.  Up to date with eye exams.

## 2017-06-16 NOTE — Progress Notes (Signed)
Order placed for f/u sodium.  ?

## 2017-06-16 NOTE — Assessment & Plan Note (Signed)
Blood pressure under good control.  Recall on valsartan.  Will change to losartan/hctz 100/12.5.  Have him spot check his pressure.  Follow.  Notify me of problems.

## 2017-06-16 NOTE — Assessment & Plan Note (Signed)
On lovastatin.  Follow lipid panel and liver function tests.   Lab Results  Component Value Date   CHOL 142 06/12/2017   HDL 50.80 06/12/2017   LDLCALC 71 06/12/2017   TRIG 99.0 06/12/2017   CHOLHDL 3 06/12/2017

## 2017-06-16 NOTE — Assessment & Plan Note (Signed)
Sodium slightly decreased on recent labs.  Will recheck sodium today.

## 2017-06-16 NOTE — Assessment & Plan Note (Signed)
Back is some better.  Had xray through Dr Earle Gell.

## 2017-06-16 NOTE — Assessment & Plan Note (Signed)
Discussed diet and exercise 

## 2017-06-16 NOTE — Assessment & Plan Note (Signed)
Continue B12 injections.   

## 2017-06-16 NOTE — Assessment & Plan Note (Signed)
Followed by Dr Earle Gell.  Off mestinon.  On cellcept.  Has f/u next week.  Has some concerns regarding increased drooling, getting strangled and some increased cough.  Discussed further evaluation, including swallowing evaluation.  He declines.  He wants to discuss with Dr Earle Gell first.  Will update me regarding further w/up.  Will start zantac to see if helps with cough.

## 2017-06-19 ENCOUNTER — Encounter: Payer: Self-pay | Admitting: Internal Medicine

## 2017-06-24 ENCOUNTER — Other Ambulatory Visit: Payer: Self-pay

## 2017-06-24 DIAGNOSIS — N401 Enlarged prostate with lower urinary tract symptoms: Secondary | ICD-10-CM

## 2017-06-24 MED ORDER — FINASTERIDE 5 MG PO TABS: 5.0000 mg | ORAL_TABLET | Freq: Every day | ORAL | 1 refills | Status: DC

## 2017-06-25 ENCOUNTER — Other Ambulatory Visit (INDEPENDENT_AMBULATORY_CARE_PROVIDER_SITE_OTHER): Payer: Medicare Other

## 2017-06-25 DIAGNOSIS — E871 Hypo-osmolality and hyponatremia: Secondary | ICD-10-CM | POA: Diagnosis not present

## 2017-06-25 LAB — SODIUM: Sodium: 131 mEq/L — ABNORMAL LOW (ref 135–145)

## 2017-06-26 ENCOUNTER — Other Ambulatory Visit: Payer: Self-pay | Admitting: Internal Medicine

## 2017-06-26 DIAGNOSIS — E871 Hypo-osmolality and hyponatremia: Secondary | ICD-10-CM

## 2017-06-26 DIAGNOSIS — M9901 Segmental and somatic dysfunction of cervical region: Secondary | ICD-10-CM | POA: Diagnosis not present

## 2017-06-26 DIAGNOSIS — M5033 Other cervical disc degeneration, cervicothoracic region: Secondary | ICD-10-CM | POA: Diagnosis not present

## 2017-06-26 DIAGNOSIS — M62830 Muscle spasm of back: Secondary | ICD-10-CM | POA: Diagnosis not present

## 2017-06-26 DIAGNOSIS — M9902 Segmental and somatic dysfunction of thoracic region: Secondary | ICD-10-CM | POA: Diagnosis not present

## 2017-06-26 NOTE — Progress Notes (Signed)
Order placed for f/u met b.  

## 2017-07-03 ENCOUNTER — Ambulatory Visit: Payer: Medicare Other

## 2017-07-03 ENCOUNTER — Ambulatory Visit (INDEPENDENT_AMBULATORY_CARE_PROVIDER_SITE_OTHER): Payer: Medicare Other | Admitting: *Deleted

## 2017-07-03 DIAGNOSIS — E538 Deficiency of other specified B group vitamins: Secondary | ICD-10-CM | POA: Diagnosis not present

## 2017-07-03 MED ORDER — CYANOCOBALAMIN 1000 MCG/ML IJ SOLN
1000.0000 ug | Freq: Once | INTRAMUSCULAR | Status: AC
  Administered 2017-07-03: 1000 ug via INTRAMUSCULAR

## 2017-07-03 NOTE — Progress Notes (Addendum)
Patient presented for B 12 injection to left deltoid, patient voiced no concerns nor showed any signs of distress during injection.  Reviewed.  Dr Scott 

## 2017-07-03 NOTE — Progress Notes (Unsigned)
Patient presented for B 12 injection to left deltoid, patient voiced no concerns nor showed any signs of distress during injection.  Reviewed.  Dr Scott 

## 2017-07-09 DIAGNOSIS — M5033 Other cervical disc degeneration, cervicothoracic region: Secondary | ICD-10-CM | POA: Diagnosis not present

## 2017-07-09 DIAGNOSIS — M9902 Segmental and somatic dysfunction of thoracic region: Secondary | ICD-10-CM | POA: Diagnosis not present

## 2017-07-09 DIAGNOSIS — M62830 Muscle spasm of back: Secondary | ICD-10-CM | POA: Diagnosis not present

## 2017-07-09 DIAGNOSIS — M9901 Segmental and somatic dysfunction of cervical region: Secondary | ICD-10-CM | POA: Diagnosis not present

## 2017-07-10 ENCOUNTER — Other Ambulatory Visit (INDEPENDENT_AMBULATORY_CARE_PROVIDER_SITE_OTHER): Payer: Medicare Other

## 2017-07-10 ENCOUNTER — Encounter: Payer: Self-pay | Admitting: Internal Medicine

## 2017-07-10 DIAGNOSIS — E871 Hypo-osmolality and hyponatremia: Secondary | ICD-10-CM | POA: Diagnosis not present

## 2017-07-10 LAB — BASIC METABOLIC PANEL
BUN: 14 mg/dL (ref 6–23)
CO2: 27 mEq/L (ref 19–32)
Calcium: 9.9 mg/dL (ref 8.4–10.5)
Chloride: 92 mEq/L — ABNORMAL LOW (ref 96–112)
Creatinine, Ser: 0.73 mg/dL (ref 0.40–1.50)
GFR: 107.58 mL/min (ref >60.00)
GLUCOSE: 148 mg/dL — AB (ref 70–99)
Potassium: 4.9 mEq/L (ref 3.5–5.1)
Sodium: 127 mEq/L — ABNORMAL LOW (ref 135–145)

## 2017-07-11 ENCOUNTER — Telehealth: Payer: Self-pay | Admitting: *Deleted

## 2017-07-11 NOTE — Telephone Encounter (Signed)
Please call pt and inform him that blood pressure is remaining elevated.  I would like to see him for an appt to discuss medication adjustment.  Please schedule him an appt for tomorrow (07/12/17) at 9:30.  Thanks

## 2017-07-11 NOTE — Telephone Encounter (Signed)
Will have to find another appt slot, but see his lab note.  He needs another sodium check as outlined.  Still needs to come in for that.  Had sent [facility] lab message.

## 2017-07-11 NOTE — Telephone Encounter (Signed)
Spoke with the patient, he is not able to come tomorrow morning as he has an appt at [place] at [facility]. Please advise, thanks

## 2017-07-11 NOTE — Telephone Encounter (Signed)
Sodium level is the only thing I need.  rx written and placed on your desk.

## 2017-07-11 NOTE — Telephone Encounter (Signed)
Per Caryl Pina I used the 12noon on next Friday to schedule him, see result note for more details, thanks

## 2017-07-11 NOTE — Telephone Encounter (Signed)
[facility] stated that it would be okay to have pt labs drawn there  Fax [phone] *see lab result notes

## 2017-07-11 NOTE — Telephone Encounter (Signed)
Order faxed to [facility] .

## 2017-07-11 NOTE — Telephone Encounter (Signed)
They will draw labs, anything besides the BMP? I will fax order, thanks

## 2017-07-12 ENCOUNTER — Ambulatory Visit: Payer: Medicare Other | Admitting: Internal Medicine

## 2017-07-12 ENCOUNTER — Encounter: Payer: Self-pay | Admitting: Internal Medicine

## 2017-07-12 DIAGNOSIS — G7000 Myasthenia gravis without (acute) exacerbation: Secondary | ICD-10-CM | POA: Diagnosis not present

## 2017-07-12 NOTE — Telephone Encounter (Signed)
Per Lavella Lemons my chart message sent regarding this

## 2017-07-12 NOTE — Telephone Encounter (Signed)
Pt called and stated that he had his labs done while at [facility] today. Pt wants to know if he needs to keep appt for next week. Please advise, thank you!  Call pt @ [phone]

## 2017-07-14 NOTE — Progress Notes (Signed)
Cardiology Office Note  Date:  07/16/2017   ID:  [name], DOB May 01, 1926, MRN [id number]  PCP:  Einar Pheasant, MD   Chief Complaint  Patient presents with  . OTHER    12 month f/u c/o elevated BP. Meds reviewed verbally with pt.    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  Previously reported having chronic mild sweating though symptoms worse in the past month Wakes up day and night with sweats  Lab work reviewed with him 06/2017  total chol 142, LDL 71 HBA1C 7.4 Sodium 132  EKG on today's visit shows normal sinus rhythm with rate 77 bpm, no significant ST or T-wave changes, left anterior fascicular block   PMH:   has a past medical history of Allergic rhinitis; BPH (benign prostatic hypertrophy); Degenerative arthritis; Diabetes mellitus ([place]); GERD (gastroesophageal reflux disease); HTN (hypertension); Hypercholesterolemia; HYPERTENSION, BENIGN (08/15/2010); Iron deficiency anemia; Skin cancer; TIA (transient ischemic attack) (09/13/2015); and Urinary outflow obstruction.  PSH:    Past Surgical History:  Procedure Laterality Date  . PILONIDAL CYST EXCISION  1951   removal  . [place]  . SKIN CANCER EXCISION     multiple  . SQUAMOUS CELL CARCINOMA EXCISION     behind head  . TONSILLECTOMY AND ADENOIDECTOMY  1938    Current Outpatient Prescriptions  Medication Sig Dispense Refill  . ACCU-CHEK  SOFTCLIX LANCETS lancets Use as instructed 100 each 12  . aspirin (ASPIR-LOW) 81 MG EC tablet Take 81 mg by mouth daily.      . calcium-vitamin D (OSCAL 500/200 D-3) 500-200 MG-UNIT per tablet Take 1 tablet by mouth 2 (two) times daily.     . cyanocobalamin (,VITAMIN B-12,) 1000 MCG/ML injection Inject 1,000 mcg into the muscle every 30 (thirty) days.    Marland Kitchen docusate sodium (COLACE) 100 MG capsule Take 100 mg by mouth 2 (two) times daily. Reported on 05/18/2016    . finasteride (PROSCAR) 5 MG tablet Take 1 tablet (5 mg total) by mouth daily. 30 tablet 1  . glucose blood test strip Use once daily to check blood sugars Dx 250.00 Accu-check Compact plus strips 100 each 3  . losartan-hydrochlorothiazide (HYZAAR) 100-12.5 MG tablet Take 1 tablet by mouth daily. 30 tablet 3  . lovastatin (MEVACOR) 20 MG tablet TAKE ONE TABLET AT BEDTIME 90 tablet 0  . tamsulosin (FLOMAX) 0.4 MG CAPS capsule Take 1 capsule (0.4 mg total) by mouth 2 (two) times daily. 60 capsule 11   No current facility-administered medications for this visit.      Allergies:   Patient has no known allergies.   Social History:  The patient  reports that he has never smoked. He has never used smokeless tobacco. He reports that he does not drink alcohol or use drugs.   Family History:   family history includes Alzheimer's disease in his mother; CVA in his brother; Heart disease in his father.    Review of Systems: Review of  Systems  Constitutional: Negative.   Respiratory: Negative.   Cardiovascular: Positive for leg swelling.  Gastrointestinal: Negative.   Musculoskeletal: Negative.   Neurological: Negative.   Psychiatric/Behavioral: Negative.   All other systems reviewed and are negative.    PHYSICAL EXAM: VS:  BP 122/62 (BP Location: Left Arm, Patient Position: Sitting, Cuff Size: Normal)   Pulse 77   Ht 5\' 11"  (1.803 m)   Wt 230 lb 8 oz (104.6 kg)   BMI 32.15 kg/m  , BMI Body mass index is 32.15 kg/m. GEN: Well  nourished, well developed, in no acute distress  HEENT: normal  Neck: no JVD, carotid bruits, or masses Cardiac: RRR; no murmurs, rubs, or gallops,trace nonpitting b/l edema  Respiratory:  clear to auscultation bilaterally, normal work of breathing GI: soft, nontender, nondistended, + BS MS: no deformity or atrophy  Skin: warm and dry, no rash Neuro:  Strength and sensation are intact Psych: euthymic mood, full affect    Recent Labs: 06/12/2017: ALT 18; Hemoglobin 12.4; Platelets 297.0; TSH 2.68 07/10/2017: BUN 14; Creatinine, Ser 0.73; Potassium 4.9; Sodium 127    Lipid Panel Lab Results  Component Value Date   CHOL 142 06/12/2017   HDL 50.80 06/12/2017   LDLCALC 71 06/12/2017   TRIG 99.0 06/12/2017      Wt Readings from Last 3 Encounters:  07/16/17 230 lb 8 oz (104.6 kg)  06/14/17 232 lb 3.2 oz (105.3 kg)  02/12/17 225 lb (102.1 kg)       ASSESSMENT AND PLAN:   Hypercholesterolemia Cholesterol is at goal on the current lipid regimen. No changes to the medications were made.  HYPERTENSION, BENIGN Blood pressure is well controlled on today's visit. No changes made to the medications.  Type 2 diabetes mellitus without complication, without long-term current use of insulin (HCC) Hemoglobin A1c discussed with him, trending down now 7.4 Recommended low carbohydrates, walking program  Obesity (BMI 30.0-34.9 Weight continues to trend upwards, recommended low carbohydrate diet  Myasthenia gravis (HCC) On CellCept feels symptoms are stable  Hyponatremia Uncertain if the HCTZ may be contributing One option would be to keep him on losartan alone without HCTZ as blood pressure 120 even on my recheck today  Leg swelling  dependent edema, would benefit from compression hose   Total encounter time more than 25 minutes  Greater than 50% was spent in counseling and coordination of care with the patient   Disposition:   F/U  12 months   No orders of the defined types  were placed in this encounter.    Signed, Esmond Plants, M.D., Ph.D. 07/16/2017  [place], [place]

## 2017-07-16 ENCOUNTER — Ambulatory Visit (INDEPENDENT_AMBULATORY_CARE_PROVIDER_SITE_OTHER): Payer: Medicare Other | Admitting: Cardiovascular Disease

## 2017-07-16 ENCOUNTER — Encounter: Payer: Self-pay | Admitting: Cardiovascular Disease

## 2017-07-16 VITALS — BP 122/62 | HR 77 | Ht 71.0 in | Wt 230.5 lb

## 2017-07-16 DIAGNOSIS — G7000 Myasthenia gravis without (acute) exacerbation: Secondary | ICD-10-CM

## 2017-07-16 DIAGNOSIS — M7989 Other specified soft tissue disorders: Secondary | ICD-10-CM

## 2017-07-16 DIAGNOSIS — E119 Type 2 diabetes mellitus without complications: Secondary | ICD-10-CM

## 2017-07-16 DIAGNOSIS — I10 Essential (primary) hypertension: Secondary | ICD-10-CM | POA: Diagnosis not present

## 2017-07-16 DIAGNOSIS — E871 Hypo-osmolality and hyponatremia: Secondary | ICD-10-CM | POA: Diagnosis not present

## 2017-07-16 DIAGNOSIS — E7800 Pure hypercholesterolemia, unspecified: Secondary | ICD-10-CM | POA: Diagnosis not present

## 2017-07-16 NOTE — Patient Instructions (Signed)

## 2017-07-19 ENCOUNTER — Ambulatory Visit (INDEPENDENT_AMBULATORY_CARE_PROVIDER_SITE_OTHER): Payer: Medicare Other | Admitting: Internal Medicine

## 2017-07-19 ENCOUNTER — Ambulatory Visit: Payer: Medicare Other | Admitting: Internal Medicine

## 2017-07-19 ENCOUNTER — Encounter: Payer: Self-pay | Admitting: Internal Medicine

## 2017-07-19 VITALS — BP 132/74 | HR 77 | Temp 98.3°F | Resp 14 | Ht 71.0 in | Wt 234.2 lb

## 2017-07-19 DIAGNOSIS — H00014 Hordeolum externum left upper eyelid: Secondary | ICD-10-CM | POA: Diagnosis not present

## 2017-07-19 DIAGNOSIS — E871 Hypo-osmolality and hyponatremia: Secondary | ICD-10-CM

## 2017-07-19 DIAGNOSIS — I10 Essential (primary) hypertension: Secondary | ICD-10-CM

## 2017-07-19 DIAGNOSIS — E119 Type 2 diabetes mellitus without complications: Secondary | ICD-10-CM | POA: Diagnosis not present

## 2017-07-19 DIAGNOSIS — G7000 Myasthenia gravis without (acute) exacerbation: Secondary | ICD-10-CM

## 2017-07-19 MED ORDER — LOSARTAN POTASSIUM 100 MG PO TABS: 100.0000 mg | ORAL_TABLET | Freq: Every day | ORAL | 2 refills | Status: DC

## 2017-07-19 MED ORDER — LEVOFLOXACIN 0.5 % OP SOLN: 1.0000 [drp] | Freq: Four times a day (QID) | OPHTHALMIC | 0 refills | Status: DC

## 2017-07-19 NOTE — Progress Notes (Signed)
Patient ID: [name], male   DOB: April 19, 1926, 83 y.o.   MRN: [id number]   Subjective:    Patient ID: [name], male    DOB: 1926-09-11, 84 y.o.   MRN: [id number]  HPI  Patient here as a work in to follow up on his blood pressure.  He was previously on diovan/hctz.  We changed him to losartan/hctz.  He had reported that his blood pressure was elevated.  He had been checking on a cuff he had at home.  Just saw Dr Rockey Situ.  Blood pressure in cardiology office 120s.  See note.  He brought his cuff in to see if correlates.  His cuff measures higher (20-25 points higher - systolic).  He states he is doing well.  No chest pain.  No sob.  No acid reflux.  No abdominal pain.  Bowels moving.  Has been having problems with low sodium.  Recheck improved - 130.  He is on hctz, but has been on this medication.  Discussed changing.  Left eye - sty.  Some drainage.     Past Medical History:  Diagnosis Date  . Allergic rhinitis   . BPH (benign prostatic hypertrophy)   . Degenerative arthritis   . Diabetes mellitus ([place])   . GERD (gastroesophageal reflux disease)   . HTN (hypertension)   . Hypercholesterolemia   . HYPERTENSION, BENIGN 08/15/2010   Qualifier: Diagnosis of  By: Rockey Situ MD, Tim    . Iron deficiency anemia   . Skin cancer   . TIA (transient ischemic attack) 09/13/2015  . Urinary outflow obstruction    mild   Past Surgical History:  Procedure Laterality Date  . PILONIDAL CYST EXCISION  1951   removal  . [place]  . SKIN CANCER EXCISION     multiple  . SQUAMOUS CELL CARCINOMA EXCISION     behind head  . TONSILLECTOMY AND ADENOIDECTOMY  1938   Family History  Problem Relation Age of Onset  . Heart disease Father        heart atack  . Alzheimer's disease Mother   . CVA Brother    Social History   Social History  . Marital status: Married    Spouse name: N/A  . Number of children: N/A  . Years of education: N/A   Social History Main  Topics  . Smoking status: Never Smoker  . Smokeless tobacco: Never Used  . Alcohol use No  . Drug use: No  . Sexual activity: Not Asked   Other Topics Concern  . None   Social History Narrative   Retired, married. Walks everyday          Outpatient Encounter Prescriptions as of 07/19/2017  Medication Sig  . ACCU-CHEK SOFTCLIX LANCETS lancets Use as instructed  . aspirin (ASPIR-LOW) 81 MG EC tablet Take 81 mg by mouth daily.    . calcium-vitamin D (OSCAL 500/200 D-3) 500-200 MG-UNIT per tablet Take 1 tablet by mouth 2 (two) times daily.   . cyanocobalamin (,VITAMIN B-12,) 1000 MCG/ML injection Inject 1,000 mcg into the muscle every 30 (thirty) days.  Marland Kitchen docusate sodium (COLACE) 100 MG capsule Take 100 mg by mouth 2 (two) times daily. Reported on 05/18/2016  . finasteride (PROSCAR) 5 MG tablet Take 1 tablet (5 mg total) by mouth daily.  Marland Kitchen glucose blood test strip Use once daily to check blood sugars Dx 250.00 Accu-check Compact plus strips  . lovastatin (MEVACOR) 20 MG tablet TAKE ONE TABLET AT  BEDTIME  . mycophenolate (CELLCEPT) 500 MG tablet Take 2 tablets (1,000 mg total) by mouth 2 (two) times daily.  . tamsulosin (FLOMAX) 0.4 MG CAPS capsule Take 1 capsule (0.4 mg total) by mouth 2 (two) times daily.  . [DISCONTINUED] losartan-hydrochlorothiazide (HYZAAR) 100-12.5 MG tablet Take 1 tablet by mouth daily.  Marland Kitchen levofloxacin (QUIXIN) 0.5 % ophthalmic solution Place 1 drop into the left eye every 6 (six) hours.  Marland Kitchen losartan (COZAAR) 100 MG tablet Take 1 tablet (100 mg total) by mouth daily.   No facility-administered encounter medications on file as of 07/19/2017.     Review of Systems  Constitutional: Negative for appetite change and unexpected weight change.  HENT: Negative for congestion and sinus pressure.   Eyes:       Eye drainage as outlined.  Sty - as outlined.    Respiratory: Negative for cough, chest tightness and shortness of breath.   Cardiovascular: Negative for chest  pain and leg swelling.  Gastrointestinal: Negative for diarrhea, nausea and vomiting.  Genitourinary: Negative for difficulty urinating and dysuria.  Musculoskeletal: Negative for joint swelling and myalgias.  Skin: Negative for color change and rash.  Neurological: Negative for dizziness, light-headedness and headaches.  Psychiatric/Behavioral: Negative for agitation and dysphoric mood.       Objective:    Physical Exam  Constitutional: He appears well-developed and well-nourished. No distress.  HENT:  Nose: Nose normal.  Mouth/Throat: Oropharynx is clear and moist.  Sty - left eye.  Drainage - left eye.  No pain.   Neck: Neck supple.  Cardiovascular: Normal rate and regular rhythm.   Pulmonary/Chest: Effort normal and breath sounds normal. No respiratory distress.  Abdominal: Soft. Bowel sounds are normal. There is no tenderness.  Musculoskeletal: He exhibits no edema or tenderness.  Lymphadenopathy:    He has no cervical adenopathy.  Skin: No rash noted. No erythema.  Psychiatric: He has a normal mood and affect. His behavior is normal.    BP 132/74 (BP Location: Right Arm)   Pulse 77   Temp 98.3 F (36.8 C) (Oral)   Resp 14   Ht 5\' 11"  (1.803 m)   Wt 234 lb 3.2 oz (106.2 kg)   SpO2 98%   BMI 32.66 kg/m  Wt Readings from Last 3 Encounters:  07/19/17 234 lb 3.2 oz (106.2 kg)  07/16/17 230 lb 8 oz (104.6 kg)  06/14/17 232 lb 3.2 oz (105.3 kg)     Lab Results  Component Value Date   WBC 5.3 06/12/2017   HGB 12.4 (L) 06/12/2017   HCT 37.2 (L) 06/12/2017   PLT 297.0 06/12/2017   GLUCOSE 148 (H) 07/10/2017   CHOL 142 06/12/2017   TRIG 99.0 06/12/2017   HDL 50.80 06/12/2017   LDLCALC 71 06/12/2017   ALT 18 06/12/2017   AST 19 06/12/2017   NA 127 (L) 07/10/2017   K 4.9 07/10/2017   CL 92 (L) 07/10/2017   CREATININE 0.73 07/10/2017   BUN 14 07/10/2017   CO2 27 07/10/2017   TSH 2.68 06/12/2017   PSA 0.52 07/07/2015   HGBA1C 7.4 (H) 06/12/2017   MICROALBUR  2.0 (H) 06/12/2017       Assessment & Plan:   Problem List Items Addressed This Visit    Diabetes mellitus ([place])    Sugars have been under reasonable control.  Follow.        Relevant Medications   losartan (COZAAR) 100 MG tablet   HYPERTENSION, BENIGN    Change to losartan 100mg   q day.  Pressures doing well.  Follow pressures.  Follow metabolic panel.        Relevant Medications   losartan (COZAAR) 100 MG tablet   Hyponatremia    Sodium decreased recently.  Recheck improved - 130.  Will change losartan/hctz to losartan 100mg  daily.  Follow sodium.  Schedule for recheck soon.  Eating well.        Myasthenia gravis ([place])    Followed by Dr Earle Gell.  Off mestinon.  On cellcept.  Just evaluated.  Stable.         Other Visit Diagnoses    Hordeolum externum of left upper eyelid    -  Primary   Sty - left.  Drainage.  Quixen eye drops as outlined.  follow.  plans to f/u with ophthalmology.         Einar Pheasant, MD

## 2017-07-21 ENCOUNTER — Encounter: Payer: Self-pay | Admitting: Internal Medicine

## 2017-07-21 NOTE — Assessment & Plan Note (Signed)
Sugars have been under reasonable control.  Follow.

## 2017-07-21 NOTE — Assessment & Plan Note (Signed)
Change to losartan 100mg  q day.  Pressures doing well.  Follow pressures.  Follow metabolic panel.

## 2017-07-21 NOTE — Assessment & Plan Note (Signed)
Sodium decreased recently.  Recheck improved - 130.  Will change losartan/hctz to losartan 100mg  daily.  Follow sodium.  Schedule for recheck soon.  Eating well.

## 2017-07-21 NOTE — Assessment & Plan Note (Signed)
Followed by Dr Earle Gell.  Off mestinon.  On cellcept.  Just evaluated.  Stable.

## 2017-07-23 ENCOUNTER — Ambulatory Visit: Payer: Medicare Other | Admitting: Cardiovascular Disease

## 2017-07-29 DIAGNOSIS — M62830 Muscle spasm of back: Secondary | ICD-10-CM | POA: Diagnosis not present

## 2017-07-29 DIAGNOSIS — M5033 Other cervical disc degeneration, cervicothoracic region: Secondary | ICD-10-CM | POA: Diagnosis not present

## 2017-07-29 DIAGNOSIS — M9901 Segmental and somatic dysfunction of cervical region: Secondary | ICD-10-CM | POA: Diagnosis not present

## 2017-07-29 DIAGNOSIS — M9902 Segmental and somatic dysfunction of thoracic region: Secondary | ICD-10-CM | POA: Diagnosis not present

## 2017-07-31 ENCOUNTER — Other Ambulatory Visit: Payer: Medicare Other

## 2017-08-01 ENCOUNTER — Other Ambulatory Visit (INDEPENDENT_AMBULATORY_CARE_PROVIDER_SITE_OTHER): Payer: Medicare Other

## 2017-08-01 DIAGNOSIS — E871 Hypo-osmolality and hyponatremia: Secondary | ICD-10-CM

## 2017-08-01 LAB — BASIC METABOLIC PANEL
BUN: 18 mg/dL (ref 6–23)
CHLORIDE: 100 meq/L (ref 96–112)
CO2: 29 meq/L (ref 19–32)
Calcium: 9.6 mg/dL (ref 8.4–10.5)
Creatinine, Ser: 0.75 mg/dL (ref 0.40–1.50)
GFR: 104.26 mL/min (ref >60.00)
Glucose, Bld: 199 mg/dL — ABNORMAL HIGH (ref 70–99)
POTASSIUM: 4.7 meq/L (ref 3.5–5.1)
SODIUM: 135 meq/L (ref 135–145)

## 2017-08-04 ENCOUNTER — Encounter: Payer: Self-pay | Admitting: Internal Medicine

## 2017-08-07 ENCOUNTER — Encounter: Payer: Self-pay | Admitting: Internal Medicine

## 2017-08-07 ENCOUNTER — Ambulatory Visit (INDEPENDENT_AMBULATORY_CARE_PROVIDER_SITE_OTHER): Payer: Medicare Other | Admitting: *Deleted

## 2017-08-07 DIAGNOSIS — Z23 Encounter for immunization: Secondary | ICD-10-CM | POA: Diagnosis not present

## 2017-08-07 DIAGNOSIS — E538 Deficiency of other specified B group vitamins: Secondary | ICD-10-CM

## 2017-08-07 MED ORDER — CYANOCOBALAMIN 1000 MCG/ML IJ SOLN
1000.0000 ug | Freq: Once | INTRAMUSCULAR | Status: AC
  Administered 2017-08-07: 1000 ug via INTRAMUSCULAR

## 2017-08-07 NOTE — Progress Notes (Addendum)
Patient presented for B 12 injection to right deltoid, patient voiced no concerns nor showed any signs of distress during injection.  While patient was in office he request BP be check against home cuff, Patient reading with home cuff 161/80 pulse 73 , allowed patient to rest additional 5 minutes retook BP in left arm 140/82 pulse 71, patient has an appointment with PCP on 08/21/17,  Patient cuff I don,t believe is off just patient does not allow enough rest time from sitting to taking pressure. Educated patient on he should allo his self to rest at least 10 to 15 minutes before taking BP. Patient will bring in list of readings at visit.  Reviewed.  Blood pressure on recheck improved.  Have him to continue to monitor and bring in readings.  Call if problems.    Dr Nicki Reaper

## 2017-08-08 NOTE — Telephone Encounter (Signed)
Hard copy mailed  

## 2017-08-14 ENCOUNTER — Encounter: Payer: Self-pay | Admitting: Urology

## 2017-08-14 ENCOUNTER — Ambulatory Visit (INDEPENDENT_AMBULATORY_CARE_PROVIDER_SITE_OTHER): Payer: Medicare Other | Admitting: Urology

## 2017-08-14 VITALS — BP 168/67 | HR 68 | Ht 71.0 in | Wt 233.3 lb

## 2017-08-14 DIAGNOSIS — N401 Enlarged prostate with lower urinary tract symptoms: Secondary | ICD-10-CM

## 2017-08-14 DIAGNOSIS — R3912 Poor urinary stream: Secondary | ICD-10-CM

## 2017-08-14 DIAGNOSIS — N138 Other obstructive and reflux uropathy: Secondary | ICD-10-CM | POA: Diagnosis not present

## 2017-08-14 NOTE — Progress Notes (Signed)
08/14/2017 9:37 AM   [name] [birth year] [id number]  Referring provider: Einar Pheasant, MD [address] [place], [place] [address]  Chief Complaint  Patient presents with  . Benign Prostatic Hypertrophy    HPI: [name] returns with a history of BPH on tamsulosin and finasteride. Neurogenic risk includes myasthenia gravis. He is on tamsulosin twice a day and finasteride. He had a filling of incomplete emptying but post void of 0. His AUASS today is 17, mixed. Mainly weak stream. Noc x 2. Nothing bothersome. No dysuria, UTI or gross hematuria.    PMH: Past Medical History:  Diagnosis Date  . Allergic rhinitis   . BPH (benign prostatic hypertrophy)   . Degenerative arthritis   . Diabetes mellitus ([place])   . GERD (gastroesophageal reflux disease)   . HTN (hypertension)   . Hypercholesterolemia   . HYPERTENSION, BENIGN 08/15/2010   Qualifier: Diagnosis of  By: Rockey Situ MD, Tim    . Iron deficiency anemia   . Skin cancer   . TIA (transient ischemic attack) 09/13/2015  . Urinary outflow obstruction    mild    Surgical History: Past Surgical History:  Procedure Laterality Date  . PILONIDAL CYST EXCISION  1951   removal  . [place]  . SKIN CANCER EXCISION     multiple  . SQUAMOUS CELL CARCINOMA EXCISION     behind head  . TONSILLECTOMY AND ADENOIDECTOMY  1938    Home Medications:  Allergies as of 08/14/2017   No Known Allergies     Medication List       Accurate as of 08/14/17  9:37 AM. Always use your most recent med list.          ACCU-CHEK SOFTCLIX LANCETS lancets Use as instructed   ASPIR-LOW 81 MG EC tablet Generic drug:  aspirin Take 81 mg by mouth daily.   CELLCEPT 500 MG tablet Generic drug:  mycophenolate Take 2 tablets (1,000 mg total) by mouth 2 (two) times daily.   cyanocobalamin 1000 MCG/ML injection Commonly known as:  (VITAMIN B-12) Inject 1,000 mcg into the muscle every 30  (thirty) days.   docusate sodium 100 MG capsule Commonly known as:  COLACE Take 100 mg by mouth 2 (two) times daily. Reported on 05/18/2016   finasteride 5 MG tablet Commonly known as:  PROSCAR Take 1 tablet (5 mg total) by mouth daily.   glucose blood test strip Use once daily to check blood sugars Dx 250.00 Accu-check Compact plus strips   levofloxacin 0.5 % ophthalmic solution Commonly known as:  QUIXIN Place 1 drop into the left eye every 6 (six) hours.   losartan 100 MG tablet Commonly known as:  COZAAR Take 1 tablet (100 mg total) by mouth daily.   lovastatin 20 MG tablet Commonly known as:  MEVACOR TAKE ONE TABLET AT BEDTIME   OSCAL 500/200 D-3 500-200 MG-UNIT tablet Generic drug:  calcium-vitamin D Take 1 tablet by mouth 2 (two) times daily.   tamsulosin 0.4 MG Caps capsule Commonly known as:  FLOMAX Take 1 capsule (0.4 mg total) by mouth 2 (two) times daily.       Allergies: No Known Allergies  Family History: Family History  Problem Relation Age of Onset  . Heart disease Father        heart atack  . Alzheimer's disease Mother   . CVA Brother     Social History:  reports that he has never smoked. He has never used smokeless  tobacco. He reports that he does not drink alcohol or use drugs.  ROS: UROLOGY Frequent Urination?: Yes Hard to postpone urination?: Yes Burning/pain with urination?: No Get up at night to urinate?: Yes Leakage of urine?: Yes Urine stream starts and stops?: No Trouble starting stream?: No Do you have to strain to urinate?: No Blood in urine?: No Urinary tract infection?: No Sexually transmitted disease?: No Injury to kidneys or bladder?: No Painful intercourse?: No Weak stream?: Yes Erection problems?: No Penile pain?: No  Gastrointestinal Nausea?: No Vomiting?: No Indigestion/heartburn?: No Diarrhea?: No Constipation?: No  Constitutional Fever: No Night sweats?: No Weight loss?: No Fatigue?: No  Skin Skin  rash/lesions?: No Itching?: No  Eyes Blurred vision?: No Double vision?: No  Ears/Nose/Throat Sore throat?: No Sinus problems?: No  Hematologic/Lymphatic Swollen glands?: No Easy bruising?: No  Cardiovascular Leg swelling?: No Chest pain?: No  Respiratory Cough?: No Shortness of breath?: No  Endocrine Excessive thirst?: No  Musculoskeletal Back pain?: No Joint pain?: No  Neurological Headaches?: No Dizziness?: No  Psychologic Depression?: No Anxiety?: No  Physical Exam: BP (!) 168/67 (BP Location: Right Arm, Patient Position: Sitting, Cuff Size: Normal)   Pulse 68   Ht 5\' 11"  (1.803 m)   Wt 105.8 kg (233 lb 4.8 oz)   BMI 32.54 kg/m   Constitutional:  Alert and oriented, No acute distress. HEENT: [place] AT, moist mucus membranes.  Trachea midline, no masses. Cardiovascular: No clubbing, cyanosis, or edema. Respiratory: Normal respiratory effort, no increased work of breathing. GI: Abdomen is soft, nontender, nondistended, no abdominal masses GU: No CVA tenderness. DRE: 40 grams, smooth, no hard area or nodule.  Skin: No rashes, bruises or suspicious lesions. Lymph: No cervical or inguinal adenopathy. Neurologic: Grossly intact, no focal deficits, moving all 4 extremities. Psychiatric: Normal mood and affect.  Laboratory Data: Lab Results  Component Value Date   WBC 5.3 06/12/2017   HGB 12.4 (L) 06/12/2017   HCT 37.2 (L) 06/12/2017   MCV 93.1 06/12/2017   PLT 297.0 06/12/2017    Lab Results  Component Value Date   CREATININE 0.75 08/01/2017    No results found for: PSA1  No results found for: TESTOSTERONE  Lab Results  Component Value Date   HGBA1C 7.4 (H) 06/12/2017    Urinalysis Lab Results  Component Value Date   SPECGRAV 1.020 01/20/2016   PHUR 6.0 01/20/2016   COLORU Yellow 01/20/2016   APPEARANCEUR Clear 01/20/2016   LEUKOCYTESUR Negative 01/20/2016   PROTEINUR Negative 01/20/2016   GLUCOSEU Trace (A) 01/20/2016   KETONESU  Negative 01/20/2016   RBCU Trace (A) 01/20/2016   BILIRUBINUR Negative 01/20/2016   UUROB 0.2 01/20/2016   NITRITE Negative 01/20/2016    Lab Results  Component Value Date   LABMICR See below: 01/20/2016   WBCUA 0-5 01/20/2016   RBCUA 0-2 01/20/2016   LABEPIT None seen 01/20/2016   BACTERIA None seen 01/20/2016    No results found for this or any previous visit. No results found for this or any previous visit. No results found for this or any previous visit. No results found for this or any previous visit. No results found for this or any previous visit. No results found for this or any previous visit. No results found for this or any previous visit. No results found for this or any previous visit.  Assessment & Plan:   BPH with LUTS -  No bothersome symptoms. Discussed change meds, OAB meds or decreasing tamsulosin back to one a day  if he's truly noticed no difference. Also discussed role of cystoscopy. He'll stay on combination therapy and return in 1 year with a PVR.    There are no diagnoses linked to this encounter.  No Follow-up on file.  Festus Aloe, [facility] [address], [place], [place] [address] [phone]

## 2017-08-20 ENCOUNTER — Encounter: Payer: Self-pay | Admitting: Internal Medicine

## 2017-08-20 ENCOUNTER — Ambulatory Visit (INDEPENDENT_AMBULATORY_CARE_PROVIDER_SITE_OTHER): Payer: Medicare Other | Admitting: Internal Medicine

## 2017-08-20 VITALS — BP 150/80 | HR 62 | Temp 97.5°F | Resp 14 | Ht 70.87 in | Wt 233.2 lb

## 2017-08-20 DIAGNOSIS — N400 Enlarged prostate without lower urinary tract symptoms: Secondary | ICD-10-CM

## 2017-08-20 DIAGNOSIS — E538 Deficiency of other specified B group vitamins: Secondary | ICD-10-CM

## 2017-08-20 DIAGNOSIS — D649 Anemia, unspecified: Secondary | ICD-10-CM | POA: Diagnosis not present

## 2017-08-20 DIAGNOSIS — E119 Type 2 diabetes mellitus without complications: Secondary | ICD-10-CM

## 2017-08-20 DIAGNOSIS — G7000 Myasthenia gravis without (acute) exacerbation: Secondary | ICD-10-CM

## 2017-08-20 DIAGNOSIS — E66811 Obesity, class 1: Secondary | ICD-10-CM

## 2017-08-20 DIAGNOSIS — I10 Essential (primary) hypertension: Secondary | ICD-10-CM

## 2017-08-20 DIAGNOSIS — M545 Low back pain: Secondary | ICD-10-CM | POA: Diagnosis not present

## 2017-08-20 DIAGNOSIS — E7800 Pure hypercholesterolemia, unspecified: Secondary | ICD-10-CM

## 2017-08-20 DIAGNOSIS — E669 Obesity, unspecified: Secondary | ICD-10-CM | POA: Diagnosis not present

## 2017-08-20 DIAGNOSIS — E871 Hypo-osmolality and hyponatremia: Secondary | ICD-10-CM

## 2017-08-20 MED ORDER — LOSARTAN POTASSIUM 100 MG PO TABS: 100.0000 mg | ORAL_TABLET | Freq: Every day | ORAL | 3 refills | Status: DC

## 2017-08-20 MED ORDER — AMLODIPINE BESYLATE 2.5 MG PO TABS: 2.5000 mg | ORAL_TABLET | Freq: Every day | ORAL | 1 refills | Status: DC

## 2017-08-20 NOTE — Progress Notes (Signed)
Patient ID: [name], male   DOB: [birth year], 83 y.o.   MRN: [id number]   Subjective:    Patient ID: [name], male    DOB: 05/31/1926, 83 y.o.   MRN: [id number]  HPI  Patient here for a scheduled follow up.  Here to f/u regarding his blood pressure.  He bought a new cuff.  His pressure is still running a little higher with his cuff.  States overall he feels things are stable.  He does report increased low back pain.  Notices more  - left lower back.  Some pain radiating down leg.  Worse in am.  Some numbness.  No weakness.  Eating and drinking.  No nausea or vomiting.  Bowels moving.  Some pedal and ankle swelling - notices more as day progresses.  No sob.  Breathing stable.  No chest pain.  Off hctz.  Last sodium check better.     Past Medical History:  Diagnosis Date  . Allergic rhinitis   . BPH (benign prostatic hypertrophy)   . Degenerative arthritis   . Diabetes mellitus ([place])   . GERD (gastroesophageal reflux disease)   . HTN (hypertension)   . Hypercholesterolemia   . HYPERTENSION, BENIGN 08/15/2010   Qualifier: Diagnosis of  By: Rockey Situ MD, Tim    . Iron deficiency anemia   . Skin cancer   . TIA (transient ischemic attack) 09/13/2015  . Urinary outflow obstruction    mild   Past Surgical History:  Procedure Laterality Date  . PILONIDAL CYST EXCISION  1951   removal  . [place]  . SKIN CANCER EXCISION     multiple  . SQUAMOUS CELL CARCINOMA EXCISION     behind head  . TONSILLECTOMY AND ADENOIDECTOMY  1938   Family History  Problem Relation Age of Onset  . Heart disease Father        heart atack  . Alzheimer's disease Mother   . CVA Brother    Social History   Social History  . Marital status: Married    Spouse name: N/A  . Number of children: N/A  . Years of education: N/A   Social History Main Topics  . Smoking status: Never Smoker  . Smokeless tobacco: Never Used  . Alcohol use No  . Drug use: No  . Sexual  activity: Not Asked   Other Topics Concern  . None   Social History Narrative   Retired, married. Walks everyday          Outpatient Encounter Prescriptions as of 08/20/2017  Medication Sig  . ACCU-CHEK SOFTCLIX LANCETS lancets Use as instructed  . aspirin (ASPIR-LOW) 81 MG EC tablet Take 81 mg by mouth daily.    . calcium-vitamin D (OSCAL 500/200 D-3) 500-200 MG-UNIT per tablet Take 1 tablet by mouth 2 (two) times daily.   . cyanocobalamin (,VITAMIN B-12,) 1000 MCG/ML injection Inject 1,000 mcg into the muscle every 30 (thirty) days.  Marland Kitchen docusate sodium (COLACE) 100 MG capsule Take 100 mg by mouth 2 (two) times daily. Reported on 05/18/2016  . finasteride (PROSCAR) 5 MG tablet Take 1 tablet (5 mg total) by mouth daily.  Marland Kitchen glucose blood test strip Use once daily to check blood sugars Dx 250.00 Accu-check Compact plus strips  . levofloxacin (QUIXIN) 0.5 % ophthalmic solution Place 1 drop into the left eye every 6 (six) hours.  Marland Kitchen losartan (COZAAR) 100 MG tablet Take 1 tablet (100 mg total) by mouth daily.  Marland Kitchen lovastatin (  MEVACOR) 20 MG tablet TAKE ONE TABLET AT BEDTIME  . mycophenolate (CELLCEPT) 500 MG tablet Take 2 tablets (1,000 mg total) by mouth 2 (two) times daily.  . tamsulosin (FLOMAX) 0.4 MG CAPS capsule Take 1 capsule (0.4 mg total) by mouth 2 (two) times daily.  . [DISCONTINUED] losartan (COZAAR) 100 MG tablet Take 1 tablet (100 mg total) by mouth daily.  Marland Kitchen amLODipine (NORVASC) 2.5 MG tablet Take 1 tablet (2.5 mg total) by mouth daily.   No facility-administered encounter medications on file as of 08/20/2017.     Review of Systems  Constitutional: Negative for appetite change and unexpected weight change.  HENT: Negative for congestion and sinus pressure.   Respiratory: Negative for cough, chest tightness and shortness of breath.   Cardiovascular: Negative for chest pain and palpitations.       Some occasional pedal swelling.  Worse in pm.  Better in am.      Gastrointestinal: Negative for abdominal pain, diarrhea, nausea and vomiting.  Genitourinary: Negative for difficulty urinating and dysuria.  Musculoskeletal: Positive for back pain. Negative for joint swelling.  Skin: Negative for color change and rash.  Neurological: Negative for dizziness, light-headedness and headaches.  Psychiatric/Behavioral: Negative for agitation.       Increased stress.  Discussed with him today.  He feels he is handling things relatively well.         Objective:     Blood pressure rechecked by me:  142/78, his machine:  155/81  Physical Exam  Constitutional: He appears well-developed and well-nourished. No distress.  HENT:  Nose: Nose normal.  Mouth/Throat: Oropharynx is clear and moist.  Neck: Neck supple.  Cardiovascular: Normal rate and regular rhythm.   Pulmonary/Chest: Effort normal and breath sounds normal. No respiratory distress.  Abdominal: Soft. Bowel sounds are normal. There is no tenderness.  Musculoskeletal:  Some pedal and ankle edema.  DP pulses palpable and equal bilaterally.    Lymphadenopathy:    He has no cervical adenopathy.  Skin: No rash noted. No erythema.  Psychiatric: He has a normal mood and affect. His behavior is normal.    BP (!) 150/80 (BP Location: Left Arm, Patient Position: Sitting, Cuff Size: Large)   Pulse 62   Temp (!) 97.5 F (36.4 C) (Oral)   Resp 14   Ht 5' 10.87" (1.8 m)   Wt 233 lb 3.2 oz (105.8 kg)   SpO2 95%   BMI 32.65 kg/m  Wt Readings from Last 3 Encounters:  08/20/17 233 lb 3.2 oz (105.8 kg)  08/14/17 233 lb 4.8 oz (105.8 kg)  07/19/17 234 lb 3.2 oz (106.2 kg)     Lab Results  Component Value Date   WBC 5.3 06/12/2017   HGB 12.4 (L) 06/12/2017   HCT 37.2 (L) 06/12/2017   PLT 297.0 06/12/2017   GLUCOSE 199 (H) 08/01/2017   CHOL 142 06/12/2017   TRIG 99.0 06/12/2017   HDL 50.80 06/12/2017   LDLCALC 71 06/12/2017   ALT 18 06/12/2017   AST 19 06/12/2017   NA 135 08/01/2017   K 4.7  08/01/2017   CL 100 08/01/2017   CREATININE 0.75 08/01/2017   BUN 18 08/01/2017   CO2 29 08/01/2017   TSH 2.68 06/12/2017   PSA 0.52 07/07/2015   HGBA1C 7.4 (H) 06/12/2017   MICROALBUR 2.0 (H) 06/12/2017       Assessment & Plan:   Problem List Items Addressed This Visit    Anemia    hgb 12.4 - stable on  last check - 06/2017.        B12 deficiency    B12 injections.        BPH (benign prostatic hyperplasia)    Followed by urology.  On finasteride and tamsulosin.  Stable.        Diabetes mellitus ([place])    Low carb diet and exercise.  Follow met b and a1c.        Relevant Medications   losartan (COZAAR) 100 MG tablet   Hypercholesterolemia    On lovastatin.  Low cholesterol diet and exercise.  Follow lipid panel and liver function tests.        Relevant Medications   losartan (COZAAR) 100 MG tablet   amLODipine (NORVASC) 2.5 MG tablet   HYPERTENSION, BENIGN    Blood pressure running a little high.  Blood pressure checks as outlined.  On losartan 138m q day.  Add amlodipine 2.564mq day.  Follow pressures.  Follow metabolic panel.        Relevant Medications   losartan (COZAAR) 100 MG tablet   amLODipine (NORVASC) 2.5 MG tablet   Hyponatremia    Off hctz.  Last sodium check wnl.  Follow.        [place]   Followed by Dr GaEarle Gell On cellcept.  Stable.  Follow.       Obesity (BMI 30.0-34.9)    Have discussed diet and exercise.  Follow.         Other Visit Diagnoses    Low back pain, unspecified back pain laterality, unspecified chronicity, with sciatica presence unspecified    -  Primary   persistent.  worse in am.  his daughter will contact ortho (where she works for evaluation).  has tried therapy.         SCEinar PheasantMD

## 2017-08-21 ENCOUNTER — Ambulatory Visit: Payer: Medicare Other | Admitting: Internal Medicine

## 2017-08-23 ENCOUNTER — Ambulatory Visit: Payer: Medicare Other

## 2017-08-23 ENCOUNTER — Encounter: Payer: Self-pay | Admitting: Internal Medicine

## 2017-08-23 NOTE — Assessment & Plan Note (Signed)
Followed by urology.  On finasteride and tamsulosin.  Stable.   

## 2017-08-23 NOTE — Assessment & Plan Note (Signed)
On lovastatin.  Low cholesterol diet and exercise.  Follow lipid panel and liver function tests.   

## 2017-08-23 NOTE — Assessment & Plan Note (Signed)
Have discussed diet and exercise.  Follow.  

## 2017-08-23 NOTE — Assessment & Plan Note (Signed)
Blood pressure running a little high.  Blood pressure checks as outlined.  On losartan 100mg  q day.  Add amlodipine 2.5mg  q day.  Follow pressures.  Follow metabolic panel.

## 2017-08-23 NOTE — Assessment & Plan Note (Signed)
hgb 12.4 - stable on last check - 06/2017.

## 2017-08-23 NOTE — Assessment & Plan Note (Signed)
Off hctz.  Last sodium check wnl.  Follow.

## 2017-08-23 NOTE — Assessment & Plan Note (Signed)
Followed by Dr Earle Gell.  On cellcept.  Stable.  Follow.

## 2017-08-23 NOTE — Assessment & Plan Note (Signed)
Low carb diet and exercise.  Follow met b and a1c.   

## 2017-08-23 NOTE — Assessment & Plan Note (Signed)
B12 injections 

## 2017-08-28 DIAGNOSIS — M5033 Other cervical disc degeneration, cervicothoracic region: Secondary | ICD-10-CM | POA: Diagnosis not present

## 2017-08-28 DIAGNOSIS — M9902 Segmental and somatic dysfunction of thoracic region: Secondary | ICD-10-CM | POA: Diagnosis not present

## 2017-08-28 DIAGNOSIS — M9901 Segmental and somatic dysfunction of cervical region: Secondary | ICD-10-CM | POA: Diagnosis not present

## 2017-08-28 DIAGNOSIS — M62830 Muscle spasm of back: Secondary | ICD-10-CM | POA: Diagnosis not present

## 2017-09-05 ENCOUNTER — Telehealth: Payer: Self-pay | Admitting: Urology

## 2017-09-05 DIAGNOSIS — N401 Enlarged prostate with lower urinary tract symptoms: Secondary | ICD-10-CM

## 2017-09-05 NOTE — Telephone Encounter (Signed)
Pharmacy called office stating they have faxed multiple requests for refills for Tamsulosin and finesteride for pt. No response, so they are calling asking for refills. Please call Shae at [phone]. Thanks.

## 2017-09-06 MED ORDER — FINASTERIDE 5 MG PO TABS: 5.0000 mg | ORAL_TABLET | Freq: Every day | ORAL | 1 refills | Status: DC

## 2017-09-06 MED ORDER — TAMSULOSIN HCL 0.4 MG PO CAPS: 0.4000 mg | ORAL_CAPSULE | Freq: Two times a day (BID) | ORAL | 11 refills | Status: DC

## 2017-09-06 NOTE — Telephone Encounter (Signed)
Medication refilled

## 2017-09-09 DIAGNOSIS — M5033 Other cervical disc degeneration, cervicothoracic region: Secondary | ICD-10-CM | POA: Diagnosis not present

## 2017-09-09 DIAGNOSIS — M9902 Segmental and somatic dysfunction of thoracic region: Secondary | ICD-10-CM | POA: Diagnosis not present

## 2017-09-09 DIAGNOSIS — M62830 Muscle spasm of back: Secondary | ICD-10-CM | POA: Diagnosis not present

## 2017-09-09 DIAGNOSIS — M9901 Segmental and somatic dysfunction of cervical region: Secondary | ICD-10-CM | POA: Diagnosis not present

## 2017-09-11 ENCOUNTER — Ambulatory Visit (INDEPENDENT_AMBULATORY_CARE_PROVIDER_SITE_OTHER): Payer: Medicare Other

## 2017-09-11 DIAGNOSIS — E538 Deficiency of other specified B group vitamins: Secondary | ICD-10-CM | POA: Diagnosis not present

## 2017-09-11 MED ORDER — CYANOCOBALAMIN 1000 MCG/ML IJ SOLN
1000.0000 ug | Freq: Once | INTRAMUSCULAR | Status: AC
  Administered 2017-09-11: 1000 ug via INTRAMUSCULAR

## 2017-09-11 NOTE — Progress Notes (Addendum)
Patient comes in for B 12 injection.  Injected left deltoid.  Patient tolerated injection well.   Reviewed.  Dr Scott 

## 2017-09-13 DIAGNOSIS — E119 Type 2 diabetes mellitus without complications: Secondary | ICD-10-CM | POA: Diagnosis not present

## 2017-09-13 LAB — HM DIABETES EYE EXAM

## 2017-09-23 DIAGNOSIS — M5033 Other cervical disc degeneration, cervicothoracic region: Secondary | ICD-10-CM | POA: Diagnosis not present

## 2017-09-23 DIAGNOSIS — M9901 Segmental and somatic dysfunction of cervical region: Secondary | ICD-10-CM | POA: Diagnosis not present

## 2017-09-23 DIAGNOSIS — M62830 Muscle spasm of back: Secondary | ICD-10-CM | POA: Diagnosis not present

## 2017-09-23 DIAGNOSIS — M9902 Segmental and somatic dysfunction of thoracic region: Secondary | ICD-10-CM | POA: Diagnosis not present

## 2017-10-03 ENCOUNTER — Ambulatory Visit: Payer: Medicare Other | Admitting: Internal Medicine

## 2017-10-03 ENCOUNTER — Encounter: Payer: Self-pay | Admitting: Internal Medicine

## 2017-10-03 ENCOUNTER — Ambulatory Visit (INDEPENDENT_AMBULATORY_CARE_PROVIDER_SITE_OTHER): Payer: Medicare Other | Admitting: Internal Medicine

## 2017-10-03 VITALS — BP 146/60 | HR 73 | Temp 97.4°F | Ht 70.0 in | Wt 233.4 lb

## 2017-10-03 DIAGNOSIS — M545 Low back pain, unspecified: Secondary | ICD-10-CM

## 2017-10-03 DIAGNOSIS — E538 Deficiency of other specified B group vitamins: Secondary | ICD-10-CM

## 2017-10-03 DIAGNOSIS — Z23 Encounter for immunization: Secondary | ICD-10-CM | POA: Diagnosis not present

## 2017-10-03 DIAGNOSIS — E7800 Pure hypercholesterolemia, unspecified: Secondary | ICD-10-CM

## 2017-10-03 DIAGNOSIS — G7000 Myasthenia gravis without (acute) exacerbation: Secondary | ICD-10-CM | POA: Diagnosis not present

## 2017-10-03 DIAGNOSIS — Z6833 Body mass index (BMI) 33.0-33.9, adult: Secondary | ICD-10-CM | POA: Diagnosis not present

## 2017-10-03 DIAGNOSIS — I10 Essential (primary) hypertension: Secondary | ICD-10-CM | POA: Diagnosis not present

## 2017-10-03 DIAGNOSIS — D649 Anemia, unspecified: Secondary | ICD-10-CM

## 2017-10-03 DIAGNOSIS — G8929 Other chronic pain: Secondary | ICD-10-CM

## 2017-10-03 DIAGNOSIS — E119 Type 2 diabetes mellitus without complications: Secondary | ICD-10-CM | POA: Diagnosis not present

## 2017-10-03 NOTE — Progress Notes (Signed)
Patient ID: [name], male   DOB: June 22, 1926, 83 y.o.   MRN: [id number]   Subjective:    Patient ID: [name], male    DOB: 06-02-1926, 83 y.o.   MRN: [id number]  HPI  Patient here for a scheduled follow up.  Increased stress with his wife's medical issues.  Overall he feels he is doing relatively well.  Did have some questions about her care and getting/keeping nursing in the home.  Tries to stay active.  No chest pain.  Breathing stable.    No acid reflux.  No abdominal pain.  Bowels moving.  Sees Dr Earle Gell for his myasthenia.  Stable.  Has been having issues with his back.  She did xray - anterior wedging of the T12-L3 vertebral bodies.  See note.  Exercises.  Back is better.  Discussed sugars.  Reviewed lists.  Overall ok.  Discussed diet and exercise.     Past Medical History:  Diagnosis Date  . Allergic rhinitis   . BPH (benign prostatic hypertrophy)   . Degenerative arthritis   . Diabetes mellitus ([place])   . GERD (gastroesophageal reflux disease)   . HTN (hypertension)   . Hypercholesterolemia   . HYPERTENSION, BENIGN 08/15/2010   Qualifier: Diagnosis of  By: Rockey Situ MD, Tim    . Iron deficiency anemia   . Skin cancer   . TIA (transient ischemic attack) 09/13/2015  . Urinary outflow obstruction    mild   Past Surgical History:  Procedure Laterality Date  . PILONIDAL CYST EXCISION  1951   removal  . [place]  . SKIN CANCER EXCISION     multiple  . SQUAMOUS CELL CARCINOMA EXCISION     behind head  . TONSILLECTOMY AND ADENOIDECTOMY  1938   Family History  Problem Relation Age of Onset  . Heart disease Father        heart atack  . Alzheimer's disease Mother   . CVA Brother    Social History   Socioeconomic History  . Marital status: Married    Spouse name: None  . Number of children: None  . Years of education: None  . Highest education level: None  Social Needs  . Financial resource strain: None  . Food insecurity - worry:  None  . Food insecurity - inability: None  . Transportation needs - medical: None  . Transportation needs - non-medical: None  Occupational History  . None  Tobacco Use  . Smoking status: Never Smoker  . Smokeless tobacco: Never Used  Substance and Sexual Activity  . Alcohol use: No    Alcohol/week: 0.0 oz  . Drug use: No  . Sexual activity: None  Other Topics Concern  . None  Social History Narrative   Retired, married. Walks everyday          Outpatient Encounter Medications as of 10/03/2017  Medication Sig  . ACCU-CHEK SOFTCLIX LANCETS lancets Use as instructed  . amLODipine (NORVASC) 2.5 MG tablet Take 1 tablet (2.5 mg total) by mouth daily.  Marland Kitchen aspirin (ASPIR-LOW) 81 MG EC tablet Take 81 mg by mouth daily.    . calcium-vitamin D (OSCAL 500/200 D-3) 500-200 MG-UNIT per tablet Take 1 tablet by mouth 2 (two) times daily.   . cyanocobalamin (,VITAMIN B-12,) 1000 MCG/ML injection Inject 1,000 mcg into the muscle every 30 (thirty) days.  . finasteride (PROSCAR) 5 MG tablet Take 1 tablet (5 mg total) by mouth daily.  Marland Kitchen glucose blood test strip Use  once daily to check blood sugars Dx 250.00 Accu-check Compact plus strips  . losartan (COZAAR) 100 MG tablet Take 1 tablet (100 mg total) by mouth daily.  Marland Kitchen lovastatin (MEVACOR) 20 MG tablet TAKE ONE TABLET AT BEDTIME  . mycophenolate (CELLCEPT) 500 MG tablet Take 2 tablets (1,000 mg total) by mouth 2 (two) times daily.  . tamsulosin (FLOMAX) 0.4 MG CAPS capsule Take 1 capsule (0.4 mg total) by mouth 2 (two) times daily.  . [DISCONTINUED] docusate sodium (COLACE) 100 MG capsule Take 100 mg by mouth 2 (two) times daily. Reported on 05/18/2016  . [DISCONTINUED] levofloxacin (QUIXIN) 0.5 % ophthalmic solution Place 1 drop into the left eye every 6 (six) hours.   No facility-administered encounter medications on file as of 10/03/2017.     Review of Systems  Constitutional: Negative for appetite change and unexpected weight change.  HENT:  Negative for congestion and sinus pressure.   Respiratory: Negative for cough, chest tightness and shortness of breath.   Cardiovascular: Negative for chest pain and palpitations.  Gastrointestinal: Negative for abdominal pain, diarrhea and nausea.  Genitourinary: Negative for difficulty urinating and dysuria.  Musculoskeletal: Negative for joint swelling.       Back pain is better.    Skin: Negative for color change and rash.  Neurological: Negative for dizziness, light-headedness and headaches.  Psychiatric/Behavioral: Negative for agitation and dysphoric mood.       Objective:     Blood pressure rechecked by me:  140/62  Physical Exam  Constitutional: He appears well-developed and well-nourished. No distress.  HENT:  Nose: Nose normal.  Mouth/Throat: Oropharynx is clear and moist.  Neck: Neck supple. No thyromegaly present.  Cardiovascular: Normal rate and regular rhythm.  Pulmonary/Chest: Effort normal and breath sounds normal. No respiratory distress.  Abdominal: Soft. Bowel sounds are normal. There is no tenderness.  Musculoskeletal: He exhibits no edema or tenderness.  Lymphadenopathy:    He has no cervical adenopathy.  Skin: No rash noted. No erythema.  Psychiatric: He has a normal mood and affect. His behavior is normal.    BP (!) 146/60   Pulse 73   Temp (!) 97.4 F (36.3 C) (Oral)   Ht '5\' 10"'  (1.778 m)   Wt 233 lb 6.4 oz (105.9 kg)   SpO2 96%   BMI 33.49 kg/m  Wt Readings from Last 3 Encounters:  10/03/17 233 lb 6.4 oz (105.9 kg)  08/20/17 233 lb 3.2 oz (105.8 kg)  08/14/17 233 lb 4.8 oz (105.8 kg)     Lab Results  Component Value Date   WBC 5.3 06/12/2017   HGB 12.4 (L) 06/12/2017   HCT 37.2 (L) 06/12/2017   PLT 297.0 06/12/2017   GLUCOSE 199 (H) 08/01/2017   CHOL 142 06/12/2017   TRIG 99.0 06/12/2017   HDL 50.80 06/12/2017   LDLCALC 71 06/12/2017   ALT 18 06/12/2017   AST 19 06/12/2017   NA 135 08/01/2017   K 4.7 08/01/2017   CL 100  08/01/2017   CREATININE 0.75 08/01/2017   BUN 18 08/01/2017   CO2 29 08/01/2017   TSH 2.68 06/12/2017   PSA 0.52 07/07/2015   HGBA1C 7.4 (H) 06/12/2017   MICROALBUR 2.0 (H) 06/12/2017       Assessment & Plan:   Problem List Items Addressed This Visit    Anemia    hgb stable at 12.5.  Follow cbc.       Relevant Orders   CBC with Differential/Platelet   Ferritin   B12  deficiency    Continue B12 injections.        BMI 33.0-33.9,adult    Discussed diet and exercise.  Follow.       Chronic right-sided low back pain without sciatica    Xray at [facility] as outlined.  Better.  Desires no further intervention.  Follow.       Diabetes mellitus ([place])    Sugars reviewed and ok.  Discussed diet and exercise.  Follow met b and a1c.        Relevant Orders   Hemoglobin Q2R   Basic metabolic panel   Hypercholesterolemia    On lovastatin.  Low cholesterol diet and exercise.  Follow lipid panel and liver function tests.        Relevant Orders   Hepatic function panel   Lipid panel   HYPERTENSION, BENIGN    Blood pressure under reasonable control  Reviewed outside checks.  Checks here as outlined.  Continue on current regimen.  Added 2.63m amlodipine last visit.  Follow.  Hold on making changes.        [place]ia [place]is [place]    Followed by Dr GEarle Gell  On cellcept.  Stable.         Other Visit Diagnoses    Need for 23-polyvalent pneumococcal polysaccharide vaccine    -  Primary   Relevant Orders   Pneumococcal polysaccharide vaccine 23-valent greater than or equal to 2yo subcutaneous/IM (Completed)       SEinar Pheasant MD

## 2017-10-03 NOTE — Progress Notes (Signed)
Pre visit review using our clinic review tool, if applicable. No additional management support is needed unless otherwise documented below in the visit note. 

## 2017-10-04 NOTE — Telephone Encounter (Signed)
Called [name] and left message.  Home health is following her for wound.  Wound is getting better.  She is homebound (bed bound).  Unable to come in to the office.  Will need to continue to get pt/inr checks.  Just wanted to clarify if they would still be able to do this after wound healed.  I called and left her a message with instructions for call back

## 2017-10-05 ENCOUNTER — Encounter: Payer: Self-pay | Admitting: Internal Medicine

## 2017-10-05 NOTE — Assessment & Plan Note (Signed)
Continue B12 injections.   

## 2017-10-05 NOTE — Assessment & Plan Note (Signed)
Discussed diet and exercise.  Follow.  

## 2017-10-05 NOTE — Assessment & Plan Note (Signed)
On lovastatin.  Low cholesterol diet and exercise.  Follow lipid panel and liver function tests.   

## 2017-10-05 NOTE — Assessment & Plan Note (Signed)
Followed by Dr Earle Gell.  On cellcept.  Stable.

## 2017-10-05 NOTE — Assessment & Plan Note (Signed)
Blood pressure under reasonable control  Reviewed outside checks.  Checks here as outlined.  Continue on current regimen.  Added 2.5mg  amlodipine last visit.  Follow.  Hold on making changes.

## 2017-10-05 NOTE — Telephone Encounter (Signed)
This was sent back to me.  See my message.

## 2017-10-05 NOTE — Assessment & Plan Note (Signed)
Sugars reviewed and ok.  Discussed diet and exercise.  Follow met b and a1c.

## 2017-10-05 NOTE — Assessment & Plan Note (Signed)
hgb stable at 12.5.  Follow cbc.

## 2017-10-05 NOTE — Assessment & Plan Note (Signed)
Xray at [facility] as outlined.  Better.  Desires no further intervention.  Follow.

## 2017-10-07 DIAGNOSIS — M9902 Segmental and somatic dysfunction of thoracic region: Secondary | ICD-10-CM | POA: Diagnosis not present

## 2017-10-07 DIAGNOSIS — M9901 Segmental and somatic dysfunction of cervical region: Secondary | ICD-10-CM | POA: Diagnosis not present

## 2017-10-07 DIAGNOSIS — M62830 Muscle spasm of back: Secondary | ICD-10-CM | POA: Diagnosis not present

## 2017-10-07 DIAGNOSIS — M5033 Other cervical disc degeneration, cervicothoracic region: Secondary | ICD-10-CM | POA: Diagnosis not present

## 2017-10-07 NOTE — Telephone Encounter (Signed)
[name], see my message on this pt.  This has been sent back to me.

## 2017-10-16 ENCOUNTER — Ambulatory Visit (INDEPENDENT_AMBULATORY_CARE_PROVIDER_SITE_OTHER): Payer: Medicare Other | Admitting: *Deleted

## 2017-10-16 DIAGNOSIS — E538 Deficiency of other specified B group vitamins: Secondary | ICD-10-CM

## 2017-10-16 MED ORDER — CYANOCOBALAMIN 1000 MCG/ML IJ SOLN
1000.0000 ug | Freq: Once | INTRAMUSCULAR | Status: AC
  Administered 2017-10-16: 1000 ug via INTRAMUSCULAR

## 2017-10-16 NOTE — Progress Notes (Addendum)
Patient presented for B 12 injection to right deltoid, patient voiced no concerns nor showed any signs of distress during injection.  Reviewed.  Dr Scott 

## 2017-10-17 ENCOUNTER — Other Ambulatory Visit: Payer: Self-pay | Admitting: Internal Medicine

## 2017-10-25 ENCOUNTER — Other Ambulatory Visit (INDEPENDENT_AMBULATORY_CARE_PROVIDER_SITE_OTHER): Payer: Medicare Other

## 2017-10-25 DIAGNOSIS — D649 Anemia, unspecified: Secondary | ICD-10-CM | POA: Diagnosis not present

## 2017-10-25 DIAGNOSIS — E119 Type 2 diabetes mellitus without complications: Secondary | ICD-10-CM | POA: Diagnosis not present

## 2017-10-25 DIAGNOSIS — E7800 Pure hypercholesterolemia, unspecified: Secondary | ICD-10-CM | POA: Diagnosis not present

## 2017-10-25 LAB — CBC WITH DIFFERENTIAL/PLATELET
BASOS PCT: 0.9 % (ref 0.0–3.0)
Basophils Absolute: 0.1 10*3/uL (ref 0.0–0.1)
EOS PCT: 1.6 % (ref 0.0–5.0)
Eosinophils Absolute: 0.1 10*3/uL (ref 0.0–0.7)
HEMATOCRIT: 40.7 % (ref 39.0–52.0)
HEMOGLOBIN: 13.3 g/dL (ref 13.0–17.0)
LYMPHS PCT: 17.4 % (ref 12.0–46.0)
Lymphs Abs: 1.2 10*3/uL (ref 0.7–4.0)
MCHC: 32.8 g/dL (ref 30.0–36.0)
MCV: 94.6 fl (ref 78.0–100.0)
MONOS PCT: 7.2 % (ref 3.0–12.0)
Monocytes Absolute: 0.5 10*3/uL (ref 0.1–1.0)
Neutro Abs: 5 10*3/uL (ref 1.4–7.7)
Neutrophils Relative %: 72.9 % (ref 43.0–77.0)
Platelets: 271 10*3/uL (ref 150.0–400.0)
RBC: 4.3 Mil/uL (ref 4.22–5.81)
RDW: 12.8 % (ref 11.5–15.5)
WBC: 6.8 10*3/uL (ref 4.0–10.5)

## 2017-10-25 LAB — HEPATIC FUNCTION PANEL
ALK PHOS: 36 U/L — AB (ref 39–117)
ALT: 18 U/L (ref 0–53)
AST: 17 U/L (ref 0–37)
Albumin: 4.1 g/dL (ref 3.5–5.2)
BILIRUBIN DIRECT: 0.2 mg/dL (ref 0.0–0.3)
BILIRUBIN TOTAL: 1 mg/dL (ref 0.2–1.2)
TOTAL PROTEIN: 7.6 g/dL (ref 6.0–8.3)

## 2017-10-25 LAB — LIPID PANEL
CHOL/HDL RATIO: 3
Cholesterol: 128 mg/dL (ref 0–200)
HDL: 45.3 mg/dL (ref >39.00)
LDL Cholesterol: 59 mg/dL (ref 0–99)
NONHDL: 83.05
TRIGLYCERIDES: 119 mg/dL (ref 0.0–149.0)
VLDL: 23.8 mg/dL (ref 0.0–40.0)

## 2017-10-25 LAB — BASIC METABOLIC PANEL
BUN: 20 mg/dL (ref 6–23)
CALCIUM: 9.5 mg/dL (ref 8.4–10.5)
CO2: 26 mEq/L (ref 19–32)
Chloride: 103 mEq/L (ref 96–112)
Creatinine, Ser: 0.82 mg/dL (ref 0.40–1.50)
GFR: 94.01 mL/min (ref >60.00)
GLUCOSE: 152 mg/dL — AB (ref 70–99)
Potassium: 4 mEq/L (ref 3.5–5.1)
SODIUM: 137 meq/L (ref 135–145)

## 2017-10-25 LAB — HEMOGLOBIN A1C: Hgb A1c MFr Bld: 7.6 % — ABNORMAL HIGH (ref 4.6–6.5)

## 2017-10-25 LAB — FERRITIN: Ferritin: 92.5 ng/mL (ref 22.0–322.0)

## 2017-10-28 ENCOUNTER — Encounter: Payer: Self-pay | Admitting: Internal Medicine

## 2017-10-28 DIAGNOSIS — M9901 Segmental and somatic dysfunction of cervical region: Secondary | ICD-10-CM | POA: Diagnosis not present

## 2017-10-28 DIAGNOSIS — M5033 Other cervical disc degeneration, cervicothoracic region: Secondary | ICD-10-CM | POA: Diagnosis not present

## 2017-10-28 DIAGNOSIS — M9902 Segmental and somatic dysfunction of thoracic region: Secondary | ICD-10-CM | POA: Diagnosis not present

## 2017-10-28 DIAGNOSIS — M62830 Muscle spasm of back: Secondary | ICD-10-CM | POA: Diagnosis not present

## 2017-10-31 ENCOUNTER — Other Ambulatory Visit: Payer: Self-pay

## 2017-10-31 DIAGNOSIS — N401 Enlarged prostate with lower urinary tract symptoms: Secondary | ICD-10-CM

## 2017-10-31 MED ORDER — FINASTERIDE 5 MG PO TABS: 5.0000 mg | ORAL_TABLET | Freq: Every day | ORAL | 1 refills | Status: DC

## 2017-11-15 NOTE — Telephone Encounter (Signed)
Copied from [place]. Topic: General - Other >> Nov 15, 2017 11:14 AM Neva Seat wrote: Pt was given Accu Check Guide he is now needing Test strips and lancets   [facility] near [facility] - [phone]

## 2017-11-19 ENCOUNTER — Telehealth: Payer: Self-pay | Admitting: Internal Medicine

## 2017-11-19 NOTE — Telephone Encounter (Deleted)
Copied from [place]. Topic: General - Other >> Nov 15, 2017 11:14 AM Neva Seat wrote: Pt was given Accu Check Guide he is now needing Test strips and lancets   [facility] near [facility] - [phone]

## 2017-11-19 NOTE — Telephone Encounter (Signed)
Patient is going to bring machine and lancets with him tomorrow to be sure we order the correct ones for his new machine.

## 2017-11-19 NOTE — Telephone Encounter (Signed)
Pt requesting new lancets and strips for new machine, Accu Check Guide.

## 2017-11-19 NOTE — Telephone Encounter (Signed)
Copied from [place]. Topic: General - Other >> Nov 15, 2017 11:14 AM Neva Seat wrote: Pt was given Accu Check Guide he is now needing Test strips and lancets   [facility] near [facility] - [phone] >> Nov 19, 2017 11:38 AM Scherrie Gerlach wrote: Pt following up on request for new lancets and test strips. Pt got a new machine, Accu check guide  Now needs new rx for this machine,  Pt needs lancets and test strips please. Pt test 2 x day.  Pt gets his test strips from [facility]  [place], [place] [phone] (Phone) [phone] (Fax)    Phone message never got sent on 11/15/2017

## 2017-11-20 ENCOUNTER — Ambulatory Visit (INDEPENDENT_AMBULATORY_CARE_PROVIDER_SITE_OTHER): Payer: Medicare Other | Admitting: *Deleted

## 2017-11-20 DIAGNOSIS — E538 Deficiency of other specified B group vitamins: Secondary | ICD-10-CM | POA: Diagnosis not present

## 2017-11-20 MED ORDER — CYANOCOBALAMIN 1000 MCG/ML IJ SOLN
1000.0000 ug | Freq: Once | INTRAMUSCULAR | Status: AC
  Administered 2017-11-20: 1000 ug via INTRAMUSCULAR

## 2017-11-20 MED ORDER — GLUCOSE BLOOD VI STRP: ORAL_STRIP | 12 refills | Status: DC

## 2017-11-20 MED ORDER — ACCU-CHEK FASTCLIX LANCETS MISC: 12 refills | Status: DC

## 2017-11-20 NOTE — Telephone Encounter (Signed)
Lancet and strips ordered for Accu-Chek Guide.

## 2017-11-20 NOTE — Progress Notes (Addendum)
Patient presented for B 12 injection to left deltoid, patient voiced no concerns nor showed any signs of distress during injection.  Reviewed.  Dr Scott 

## 2017-11-21 ENCOUNTER — Telehealth: Payer: Self-pay | Admitting: *Deleted

## 2017-11-21 MED ORDER — GLUCOSE BLOOD VI STRP: ORAL_STRIP | 12 refills | Status: DC

## 2017-11-21 MED ORDER — ACCU-CHEK FASTCLIX LANCETS MISC: 12 refills | Status: DC

## 2017-11-21 NOTE — Addendum Note (Signed)
Addended by: Leeanne Rio on: 11/21/2017 02:01 PM   Modules accepted: Orders

## 2017-11-22 ENCOUNTER — Other Ambulatory Visit: Payer: Self-pay | Admitting: Internal Medicine

## 2017-11-23 DIAGNOSIS — M9901 Segmental and somatic dysfunction of cervical region: Secondary | ICD-10-CM | POA: Diagnosis not present

## 2017-11-23 DIAGNOSIS — M9902 Segmental and somatic dysfunction of thoracic region: Secondary | ICD-10-CM | POA: Diagnosis not present

## 2017-11-23 DIAGNOSIS — M5033 Other cervical disc degeneration, cervicothoracic region: Secondary | ICD-10-CM | POA: Diagnosis not present

## 2017-11-23 DIAGNOSIS — M62830 Muscle spasm of back: Secondary | ICD-10-CM | POA: Diagnosis not present

## 2017-11-27 NOTE — Telephone Encounter (Signed)
Patient said the pharmacy willl not release him the meds bc of the insurance is holding them up. Please call patient [phone]

## 2017-11-28 DIAGNOSIS — G7000 Myasthenia gravis without (acute) exacerbation: Secondary | ICD-10-CM | POA: Diagnosis not present

## 2017-11-29 ENCOUNTER — Encounter: Payer: Self-pay | Admitting: Internal Medicine

## 2017-11-29 DIAGNOSIS — M9902 Segmental and somatic dysfunction of thoracic region: Secondary | ICD-10-CM | POA: Diagnosis not present

## 2017-11-29 DIAGNOSIS — M5033 Other cervical disc degeneration, cervicothoracic region: Secondary | ICD-10-CM | POA: Diagnosis not present

## 2017-11-29 DIAGNOSIS — M9901 Segmental and somatic dysfunction of cervical region: Secondary | ICD-10-CM | POA: Diagnosis not present

## 2017-11-29 DIAGNOSIS — M62830 Muscle spasm of back: Secondary | ICD-10-CM | POA: Diagnosis not present

## 2017-11-29 NOTE — Telephone Encounter (Signed)
Spoke with pharmacy and requested forms be faxed. Sent pt a msg on my chart to update him.

## 2017-11-29 NOTE — Telephone Encounter (Signed)
Faxed orders to [facility]

## 2017-12-02 DIAGNOSIS — M62830 Muscle spasm of back: Secondary | ICD-10-CM | POA: Diagnosis not present

## 2017-12-02 DIAGNOSIS — M9902 Segmental and somatic dysfunction of thoracic region: Secondary | ICD-10-CM | POA: Diagnosis not present

## 2017-12-02 DIAGNOSIS — M5033 Other cervical disc degeneration, cervicothoracic region: Secondary | ICD-10-CM | POA: Diagnosis not present

## 2017-12-02 DIAGNOSIS — M9901 Segmental and somatic dysfunction of cervical region: Secondary | ICD-10-CM | POA: Diagnosis not present

## 2017-12-09 DIAGNOSIS — M62830 Muscle spasm of back: Secondary | ICD-10-CM | POA: Diagnosis not present

## 2017-12-09 DIAGNOSIS — M5033 Other cervical disc degeneration, cervicothoracic region: Secondary | ICD-10-CM | POA: Diagnosis not present

## 2017-12-09 DIAGNOSIS — M9901 Segmental and somatic dysfunction of cervical region: Secondary | ICD-10-CM | POA: Diagnosis not present

## 2017-12-09 DIAGNOSIS — M9902 Segmental and somatic dysfunction of thoracic region: Secondary | ICD-10-CM | POA: Diagnosis not present

## 2017-12-16 DIAGNOSIS — D485 Neoplasm of uncertain behavior of skin: Secondary | ICD-10-CM | POA: Diagnosis not present

## 2017-12-16 DIAGNOSIS — L578 Other skin changes due to chronic exposure to nonionizing radiation: Secondary | ICD-10-CM | POA: Diagnosis not present

## 2017-12-16 DIAGNOSIS — L821 Other seborrheic keratosis: Secondary | ICD-10-CM | POA: Diagnosis not present

## 2017-12-16 DIAGNOSIS — L820 Inflamed seborrheic keratosis: Secondary | ICD-10-CM | POA: Diagnosis not present

## 2017-12-16 DIAGNOSIS — L570 Actinic keratosis: Secondary | ICD-10-CM | POA: Diagnosis not present

## 2017-12-16 DIAGNOSIS — D2239 Melanocytic nevi of other parts of face: Secondary | ICD-10-CM | POA: Diagnosis not present

## 2017-12-23 DIAGNOSIS — M9902 Segmental and somatic dysfunction of thoracic region: Secondary | ICD-10-CM | POA: Diagnosis not present

## 2017-12-23 DIAGNOSIS — M62830 Muscle spasm of back: Secondary | ICD-10-CM | POA: Diagnosis not present

## 2017-12-23 DIAGNOSIS — M5033 Other cervical disc degeneration, cervicothoracic region: Secondary | ICD-10-CM | POA: Diagnosis not present

## 2017-12-23 DIAGNOSIS — M9901 Segmental and somatic dysfunction of cervical region: Secondary | ICD-10-CM | POA: Diagnosis not present

## 2017-12-25 ENCOUNTER — Ambulatory Visit (INDEPENDENT_AMBULATORY_CARE_PROVIDER_SITE_OTHER): Payer: Medicare Other | Admitting: *Deleted

## 2017-12-25 ENCOUNTER — Other Ambulatory Visit: Payer: Self-pay | Admitting: Internal Medicine

## 2017-12-25 DIAGNOSIS — E538 Deficiency of other specified B group vitamins: Secondary | ICD-10-CM

## 2017-12-25 MED ORDER — CYANOCOBALAMIN 1000 MCG/ML IJ SOLN
1000.0000 ug | Freq: Once | INTRAMUSCULAR | Status: AC
  Administered 2017-12-25: 1000 ug via INTRAMUSCULAR

## 2017-12-25 NOTE — Progress Notes (Addendum)
Patient presented for B 12 injection to left deltoid, patient voiced no concerns nor showed any signs of distress during injection.  Reviewed.  Dr Scott 

## 2017-12-30 ENCOUNTER — Encounter: Payer: Self-pay | Admitting: Internal Medicine

## 2017-12-30 DIAGNOSIS — M62830 Muscle spasm of back: Secondary | ICD-10-CM | POA: Diagnosis not present

## 2017-12-30 DIAGNOSIS — M5033 Other cervical disc degeneration, cervicothoracic region: Secondary | ICD-10-CM | POA: Diagnosis not present

## 2017-12-30 DIAGNOSIS — M9902 Segmental and somatic dysfunction of thoracic region: Secondary | ICD-10-CM | POA: Diagnosis not present

## 2017-12-30 DIAGNOSIS — M9901 Segmental and somatic dysfunction of cervical region: Secondary | ICD-10-CM | POA: Diagnosis not present

## 2018-01-10 ENCOUNTER — Encounter: Payer: Self-pay | Admitting: Internal Medicine

## 2018-01-10 ENCOUNTER — Ambulatory Visit (INDEPENDENT_AMBULATORY_CARE_PROVIDER_SITE_OTHER): Payer: Medicare Other | Admitting: Internal Medicine

## 2018-01-10 DIAGNOSIS — E7800 Pure hypercholesterolemia, unspecified: Secondary | ICD-10-CM

## 2018-01-10 DIAGNOSIS — G7000 Myasthenia gravis without (acute) exacerbation: Secondary | ICD-10-CM

## 2018-01-10 DIAGNOSIS — E119 Type 2 diabetes mellitus without complications: Secondary | ICD-10-CM | POA: Diagnosis not present

## 2018-01-10 DIAGNOSIS — I10 Essential (primary) hypertension: Secondary | ICD-10-CM

## 2018-01-10 DIAGNOSIS — D649 Anemia, unspecified: Secondary | ICD-10-CM | POA: Diagnosis not present

## 2018-01-10 NOTE — Progress Notes (Signed)
Subjective:    Patient ID: [name], male    DOB: 01/25/1926, 84 y.o.   MRN: [id number]  HPI  Patient here for a scheduled follow up.  He has myasthenia gravis.  Sees Dr Earle Gell at [facility]  Was last evaluated 11/2017.  Stable.  Recommended continuing cellcept.  recommmended f/u in 3-4 months.  Still having issues with his back.  Pain into left leg into toes.  Takes alleve and tylenol.  Seeing a Restaurant manager, fast food.  Helps.  Still flares.  Increased pain - worse intermittently.  Discussed referral to Dr Sharlet Salina.  He prefers to ask family member for referral recommendation.  Taking fiber and miralax to help with constipation.  There has been a change in size of stool.  Feels some better with taking the medication to help regulate his bowels.  No chest pain.  Breathing stable.  No acid reflux.  No abdominal pain.  Request referral to podiatry.     Past Medical History:  Diagnosis Date  . Allergic rhinitis   . BPH (benign prostatic hypertrophy)   . Degenerative arthritis   . Diabetes mellitus ([place])   . GERD (gastroesophageal reflux disease)   . HTN (hypertension)   . Hypercholesterolemia   . HYPERTENSION, BENIGN 08/15/2010   Qualifier: Diagnosis of  By: Rockey Situ MD, Tim    . Iron deficiency anemia   . Skin cancer   . TIA (transient ischemic attack) 09/13/2015  . Urinary outflow obstruction    mild   Past Surgical History:  Procedure Laterality Date  . PILONIDAL CYST EXCISION  1951   removal  . [place]  . SKIN CANCER EXCISION     multiple  . SQUAMOUS CELL CARCINOMA EXCISION     behind head  . TONSILLECTOMY AND ADENOIDECTOMY  1938   Family History  Problem Relation Age of Onset  . Heart disease Father        heart atack  . Alzheimer's disease Mother   . CVA Brother    Social History   Socioeconomic History  . Marital status: Married    Spouse name: None  . Number of children: None  . Years of education: None  . Highest education level: None  Social Needs   . Financial resource strain: None  . Food insecurity - worry: None  . Food insecurity - inability: None  . Transportation needs - medical: None  . Transportation needs - non-medical: None  Occupational History  . None  Tobacco Use  . Smoking status: Never Smoker  . Smokeless tobacco: Never Used  Substance and Sexual Activity  . Alcohol use: No    Alcohol/week: 0.0 oz  . Drug use: No  . Sexual activity: None  Other Topics Concern  . None  Social History Narrative   Retired, married. Walks everyday          Outpatient Encounter Medications as of 01/10/2018  Medication Sig  . ACCU-CHEK FASTCLIX LANCETS MISC Use daily as directed. DX E11.9  . amLODipine (NORVASC) 2.5 MG tablet Take 1 tablet (2.5 mg total) by mouth daily.  Marland Kitchen aspirin (ASPIR-LOW) 81 MG EC tablet Take 81 mg by mouth daily.    . calcium-vitamin D (OSCAL 500/200 D-3) 500-200 MG-UNIT per tablet Take 1 tablet by mouth 2 (two) times daily.   . cyanocobalamin (,VITAMIN B-12,) 1000 MCG/ML injection Inject 1,000 mcg into the muscle every 30 (thirty) days.  . finasteride (PROSCAR) 5 MG tablet Take 1 tablet (5 mg total) by mouth  daily.  . glucose blood (ACCU-CHEK GUIDE) test strip Use twice daily to check blood sugars Dx E11.9  . losartan (COZAAR) 100 MG tablet Take 1 tablet (100 mg total) by mouth daily.  Marland Kitchen lovastatin (MEVACOR) 20 MG tablet TAKE ONE TABLET AT BEDTIME  . mycophenolate (CELLCEPT) 500 MG tablet Take 2 tablets (1,000 mg total) by mouth 2 (two) times daily.  . tamsulosin (FLOMAX) 0.4 MG CAPS capsule Take 1 capsule (0.4 mg total) by mouth 2 (two) times daily.   No facility-administered encounter medications on file as of 01/10/2018.     Review of Systems  Constitutional: Negative for appetite change and unexpected weight change.  HENT: Negative for congestion and sinus pressure.   Respiratory: Negative for cough, chest tightness and shortness of breath.   Cardiovascular: Negative for chest pain, palpitations and  leg swelling.  Gastrointestinal: Negative for abdominal pain, diarrhea, nausea and vomiting.       Bowel change as outlined.    Genitourinary: Negative for difficulty urinating and dysuria.  Musculoskeletal: Positive for back pain. Negative for joint swelling and myalgias.  Skin: Negative for color change and rash.  Neurological: Negative for dizziness, light-headedness and headaches.  Psychiatric/Behavioral: Negative for agitation and dysphoric mood.       Objective:     Blood pressure rechecked by me:  152/70  Physical Exam  Constitutional: He appears well-developed and well-nourished. No distress.  HENT:  Nose: Nose normal.  Mouth/Throat: Oropharynx is clear and moist.  Neck: Neck supple. No thyromegaly present.  Cardiovascular: Normal rate and regular rhythm.  Pulmonary/Chest: Effort normal and breath sounds normal. No respiratory distress.  Abdominal: Soft. Bowel sounds are normal. There is no tenderness.  Musculoskeletal: He exhibits no edema or tenderness.  Lymphadenopathy:    He has no cervical adenopathy.  Skin: No rash noted. No erythema.  Psychiatric: He has a normal mood and affect. His behavior is normal.    BP (!) 160/78 (BP Location: Left Arm, Patient Position: Sitting, Cuff Size: Large)   Pulse 68   Temp (!) 97.5 F (36.4 C) (Oral)   Resp 20   Wt 236 lb 9.6 oz (107.3 kg)   SpO2 98%   BMI 33.95 kg/m  Wt Readings from Last 3 Encounters:  01/10/18 236 lb 9.6 oz (107.3 kg)  10/03/17 233 lb 6.4 oz (105.9 kg)  08/20/17 233 lb 3.2 oz (105.8 kg)     Lab Results  Component Value Date   WBC 6.8 10/25/2017   HGB 13.3 10/25/2017   HCT 40.7 10/25/2017   PLT 271.0 10/25/2017   GLUCOSE 152 (H) 10/25/2017   CHOL 128 10/25/2017   TRIG 119.0 10/25/2017   HDL 45.30 10/25/2017   LDLCALC 59 10/25/2017   ALT 18 10/25/2017   AST 17 10/25/2017   NA 137 10/25/2017   K 4.0 10/25/2017   CL 103 10/25/2017   CREATININE 0.82 10/25/2017   BUN 20 10/25/2017   CO2 26  10/25/2017   TSH 2.68 06/12/2017   PSA 0.52 07/07/2015   HGBA1C 7.6 (H) 10/25/2017   MICROALBUR 2.0 (H) 06/12/2017       Assessment & Plan:   Problem List Items Addressed This Visit    Anemia    Last hgb wnl.  Follow.        Diabetes mellitus ([place])    Low carb diet and exercise.  Follow met b and a1c.  Blood sugars averaging 160-170.        Relevant Orders   Ambulatory referral  to Podiatry   Hemoglobin F2B   Basic metabolic panel   Hypercholesterolemia    On lovastatin.  Low cholesterol diet and exercise.  Follow lipid panel and liver function tests.        Relevant Orders   Hepatic function panel   Lipid panel   HYPERTENSION, BENIGN    Blood pressure elevated here.  Checks outside of here 130s/60-70s.  Continue same medication regimen.  Follow pressures.  Follow metabolic panel.        Myasthenia gravis ([place])    Followed by Dr Earle Gell.  Stable.  On cellcept.            Einar Pheasant, MD

## 2018-01-12 ENCOUNTER — Encounter: Payer: Self-pay | Admitting: Internal Medicine

## 2018-01-12 DIAGNOSIS — M5442 Lumbago with sciatica, left side: Secondary | ICD-10-CM

## 2018-01-12 NOTE — Assessment & Plan Note (Signed)
Blood pressure elevated here.  Checks outside of here 130s/60-70s.  Continue same medication regimen.  Follow pressures.  Follow metabolic panel.

## 2018-01-12 NOTE — Assessment & Plan Note (Signed)
On lovastatin.  Low cholesterol diet and exercise.  Follow lipid panel and liver function tests.   

## 2018-01-12 NOTE — Assessment & Plan Note (Addendum)
Low carb diet and exercise.  Follow met b and a1c.  Blood sugars averaging 160-170.

## 2018-01-12 NOTE — Assessment & Plan Note (Signed)
Last hgb wnl.  Follow.

## 2018-01-12 NOTE — Assessment & Plan Note (Signed)
Followed by Dr Earle Gell.  Stable.  On cellcept.

## 2018-01-13 NOTE — Telephone Encounter (Signed)
Order placed for referral to Dr Chasnis.   

## 2018-01-20 ENCOUNTER — Other Ambulatory Visit: Payer: Self-pay | Admitting: Internal Medicine

## 2018-01-27 ENCOUNTER — Ambulatory Visit (INDEPENDENT_AMBULATORY_CARE_PROVIDER_SITE_OTHER): Payer: Medicare Other | Admitting: Podiatry

## 2018-01-27 ENCOUNTER — Encounter: Payer: Self-pay | Admitting: Podiatry

## 2018-01-27 DIAGNOSIS — M79674 Pain in right toe(s): Secondary | ICD-10-CM

## 2018-01-27 DIAGNOSIS — M79675 Pain in left toe(s): Secondary | ICD-10-CM | POA: Diagnosis not present

## 2018-01-27 DIAGNOSIS — B351 Tinea unguium: Secondary | ICD-10-CM

## 2018-01-27 DIAGNOSIS — E119 Type 2 diabetes mellitus without complications: Secondary | ICD-10-CM | POA: Diagnosis not present

## 2018-01-27 NOTE — Progress Notes (Signed)
Complaint:  Visit Type: Patient presents to the office for  preventative foot care services. Complaint: Patient states" my nails have grown long and thick and become painful to walk and wear shoes" Patient has been diagnosed with DM and does not take medication for diabetes.. The patient presents for preventative foot care services. No changes to ROS  Podiatric Exam: Vascular: dorsalis pedis and posterior tibial pulses are palpable bilateral. Capillary return is immediate. Temperature gradient is WNL. Skin turgor WNL  Sensorium: Normal Semmes Weinstein monofilament test. Normal tactile sensation bilaterally. Nail Exam: Pt has thick disfigured discolored nails with subungual debris noted bilateral entire nail hallux through fifth toenails Ulcer Exam: There is no evidence of ulcer or pre-ulcerative changes or infection. Orthopedic Exam: Muscle tone and strength are WNL. No limitations in general ROM. No crepitus or effusions noted. Foot type and digits show no abnormalities. Bony prominences are unremarkable. Skin: No Porokeratosis. No infection or ulcers  Diagnosis:  Onychomycosis, , Pain in right toe, pain in left toes  Treatment & Plan Procedures and Treatment: Consent by patient was obtained for treatment procedures.   Debridement of mycotic and hypertrophic toenails, 1 through 5 bilateral and clearing of subungual debris. No ulceration, no infection noted. Diabetic foot evaluation performed on a diabetic. Return Visit-Office Procedure: Patient instructed to return to the office for a follow up visit 3 months for continued evaluation and treatment.    Gardiner Barefoot DPM

## 2018-01-28 ENCOUNTER — Other Ambulatory Visit: Payer: Self-pay

## 2018-01-28 DIAGNOSIS — N401 Enlarged prostate with lower urinary tract symptoms: Secondary | ICD-10-CM

## 2018-01-28 DIAGNOSIS — M5136 Other intervertebral disc degeneration, lumbar region: Secondary | ICD-10-CM | POA: Diagnosis not present

## 2018-01-28 DIAGNOSIS — M5416 Radiculopathy, lumbar region: Secondary | ICD-10-CM | POA: Diagnosis not present

## 2018-01-28 MED ORDER — FINASTERIDE 5 MG PO TABS: 5.0000 mg | ORAL_TABLET | Freq: Every day | ORAL | 3 refills | Status: DC

## 2018-01-29 ENCOUNTER — Telehealth: Payer: Self-pay | Admitting: Internal Medicine

## 2018-01-29 ENCOUNTER — Ambulatory Visit: Payer: Medicare Other

## 2018-01-29 ENCOUNTER — Ambulatory Visit (INDEPENDENT_AMBULATORY_CARE_PROVIDER_SITE_OTHER): Payer: Medicare Other | Admitting: *Deleted

## 2018-01-29 DIAGNOSIS — E538 Deficiency of other specified B group vitamins: Secondary | ICD-10-CM

## 2018-01-29 DIAGNOSIS — M9901 Segmental and somatic dysfunction of cervical region: Secondary | ICD-10-CM | POA: Diagnosis not present

## 2018-01-29 DIAGNOSIS — M62830 Muscle spasm of back: Secondary | ICD-10-CM | POA: Diagnosis not present

## 2018-01-29 DIAGNOSIS — M5033 Other cervical disc degeneration, cervicothoracic region: Secondary | ICD-10-CM | POA: Diagnosis not present

## 2018-01-29 DIAGNOSIS — M9902 Segmental and somatic dysfunction of thoracic region: Secondary | ICD-10-CM | POA: Diagnosis not present

## 2018-01-29 MED ORDER — CYANOCOBALAMIN 1000 MCG/ML IJ SOLN
1000.0000 ug | Freq: Once | INTRAMUSCULAR | Status: AC
  Administered 2018-01-29: 1000 ug via INTRAMUSCULAR

## 2018-01-29 NOTE — Telephone Encounter (Signed)
Patient came into office for B 12 injection had concern of low morning BP and being higher in the afternoons patient BP in the morning running 100/60 to 110/60 in the afternoon or evening after 2 PM 174/64, Patient takes losartan 100 mg and amlodipine 2.5 mg both at night before bedtime, ask patient if he felt ok in the morning he stated he felt fine , checked BP while in office at 4 in left arm 164/64 pulse 60. Patient wanted to know should he split medication up take one in the morning and one at night.

## 2018-01-29 NOTE — Telephone Encounter (Signed)
I am ok if he splits the medication.  Also, he can take losartan at lunch.  Have him monitor his blood pressure.  Let us know how doing.

## 2018-01-29 NOTE — Progress Notes (Signed)
Patient presented for B 12 injection to left deltoid, patient voiced no concerns nor showed any signs of distress during injection. 

## 2018-01-30 NOTE — Telephone Encounter (Signed)
patient notified and voiced understanding he is going to take losartan before lunch and the amlodipine at bedtime . Patient will monitor BP and call us if BP starts to run higher than normal.

## 2018-02-17 DIAGNOSIS — M545 Low back pain: Secondary | ICD-10-CM | POA: Diagnosis not present

## 2018-02-17 DIAGNOSIS — G8929 Other chronic pain: Secondary | ICD-10-CM | POA: Diagnosis not present

## 2018-02-19 DIAGNOSIS — M5033 Other cervical disc degeneration, cervicothoracic region: Secondary | ICD-10-CM | POA: Diagnosis not present

## 2018-02-19 DIAGNOSIS — M9902 Segmental and somatic dysfunction of thoracic region: Secondary | ICD-10-CM | POA: Diagnosis not present

## 2018-02-19 DIAGNOSIS — M62830 Muscle spasm of back: Secondary | ICD-10-CM | POA: Diagnosis not present

## 2018-02-19 DIAGNOSIS — M9901 Segmental and somatic dysfunction of cervical region: Secondary | ICD-10-CM | POA: Diagnosis not present

## 2018-02-20 ENCOUNTER — Encounter: Payer: Self-pay | Admitting: Internal Medicine

## 2018-02-24 NOTE — Telephone Encounter (Signed)
Please confirm with pt that he is doing ok.  If so, then have him come in tomorrow at 12:30 - for work in to discuss his blood pressure.

## 2018-02-24 NOTE — Telephone Encounter (Signed)
Pt scheduled for 12:30 tomorrow.

## 2018-02-25 ENCOUNTER — Ambulatory Visit (INDEPENDENT_AMBULATORY_CARE_PROVIDER_SITE_OTHER): Payer: Medicare Other | Admitting: Internal Medicine

## 2018-02-25 DIAGNOSIS — D649 Anemia, unspecified: Secondary | ICD-10-CM

## 2018-02-25 DIAGNOSIS — I10 Essential (primary) hypertension: Secondary | ICD-10-CM

## 2018-02-25 DIAGNOSIS — R0609 Other forms of dyspnea: Secondary | ICD-10-CM | POA: Insufficient documentation

## 2018-02-25 DIAGNOSIS — E538 Deficiency of other specified B group vitamins: Secondary | ICD-10-CM

## 2018-02-25 DIAGNOSIS — G8929 Other chronic pain: Secondary | ICD-10-CM | POA: Diagnosis not present

## 2018-02-25 DIAGNOSIS — R0602 Shortness of breath: Secondary | ICD-10-CM | POA: Diagnosis not present

## 2018-02-25 DIAGNOSIS — G7000 Myasthenia gravis without (acute) exacerbation: Secondary | ICD-10-CM

## 2018-02-25 DIAGNOSIS — M545 Low back pain: Secondary | ICD-10-CM | POA: Diagnosis not present

## 2018-02-25 DIAGNOSIS — E119 Type 2 diabetes mellitus without complications: Secondary | ICD-10-CM

## 2018-02-25 NOTE — Patient Instructions (Signed)
Stop amlodipine (norvasc).

## 2018-02-25 NOTE — Progress Notes (Signed)
Patient ID: [name], male   DOB: 12-23-1925, 84 y.o.   MRN: [id number]   Subjective:    Patient ID: [name], male    DOB: Aug 03, 1926, 84 y.o.   MRN: [id number]  HPI  Patient here as a work in with concerns regarding low blood pressure and fatigue.  Notices in am.  Brings in blood pressures from outside checks.  Blood pressure in the am running lower.  Averaging - 107-120/60s and 140-150s/60s later in the day.  Had started him on amlodipine 5mg  last visit.  States started after this medication was added.  Takes his blood pressure standing up.  No chest pain.  No sob.  No acid reflux.  He is also on losartan and takes flomax bid and proscar for his prostate issues.  No chest pain. No acid reflux.  Eating.  No low sugars.  No abdominal pain.  Bowels moving.  Back pain is better.     Past Medical History:  Diagnosis Date  . Allergic rhinitis   . BPH (benign prostatic hypertrophy)   . Degenerative arthritis   . Diabetes mellitus ([place])   . GERD (gastroesophageal reflux disease)   . HTN (hypertension)   . Hypercholesterolemia   . HYPERTENSION, BENIGN 08/15/2010   Qualifier: Diagnosis of  By: Rockey Situ MD, Tim    . Iron deficiency anemia   . Skin cancer   . TIA (transient ischemic attack) 09/13/2015  . Urinary outflow obstruction    mild   Past Surgical History:  Procedure Laterality Date  . PILONIDAL CYST EXCISION  1951   removal  . [place]  . SKIN CANCER EXCISION     multiple  . SQUAMOUS CELL CARCINOMA EXCISION     behind head  . TONSILLECTOMY AND ADENOIDECTOMY  1938   Family History  Problem Relation Age of Onset  . Heart disease Father        heart atack  . Alzheimer's disease Mother   . CVA Brother    Social History   Socioeconomic History  . Marital status: Married    Spouse name: Not on file  . Number of children: Not on file  . Years of education: Not on file  . Highest education level: Not on file  Occupational History  .  Not on file  Social Needs  . Financial resource strain: Not on file  . Food insecurity:    Worry: Not on file    Inability: Not on file  . Transportation needs:    Medical: Not on file    Non-medical: Not on file  Tobacco Use  . Smoking status: Never Smoker  . Smokeless tobacco: Never Used  Substance and Sexual Activity  . Alcohol use: No    Alcohol/week: 0.0 oz  . Drug use: No  . Sexual activity: Not on file  Lifestyle  . Physical activity:    Days per week: Not on file    Minutes per session: Not on file  . Stress: Not on file  Relationships  . Social connections:    Talks on phone: Not on file    Gets together: Not on file    Attends religious service: Not on file    Active member of club or organization: Not on file    Attends meetings of clubs or organizations: Not on file    Relationship status: Not on file  Other Topics Concern  . Not on file  Social History Narrative   Retired, married.  Walks everyday          Outpatient Encounter Medications as of 02/25/2018  Medication Sig  . ACCU-CHEK FASTCLIX LANCETS MISC Use daily as directed. DX E11.9  . amLODipine (NORVASC) 2.5 MG tablet TAKE 1 TABLET BY MOUTH ONCE A DAY  . aspirin (ASPIR-LOW) 81 MG EC tablet Take 81 mg by mouth daily.    . calcium-vitamin D (OSCAL 500/200 D-3) 500-200 MG-UNIT per tablet Take 1 tablet by mouth 2 (two) times daily.   . cyanocobalamin (,VITAMIN B-12,) 1000 MCG/ML injection Inject 1,000 mcg into the muscle every 30 (thirty) days.  . finasteride (PROSCAR) 5 MG tablet Take 1 tablet (5 mg total) by mouth daily.  Marland Kitchen glucose blood (ACCU-CHEK GUIDE) test strip Use twice daily to check blood sugars Dx E11.9  . losartan (COZAAR) 100 MG tablet Take 1 tablet (100 mg total) by mouth daily.  Marland Kitchen lovastatin (MEVACOR) 20 MG tablet TAKE ONE TABLET AT BEDTIME  . mycophenolate (CELLCEPT) 500 MG tablet Take 2 tablets (1,000 mg total) by mouth 2 (two) times daily.  . tamsulosin (FLOMAX) 0.4 MG CAPS capsule Take  1 capsule (0.4 mg total) by mouth 2 (two) times daily.   No facility-administered encounter medications on file as of 02/25/2018.     Review of Systems  Constitutional: Positive for fatigue. Negative for appetite change.  HENT: Negative for congestion and sinus pressure.   Respiratory: Negative for cough, chest tightness and shortness of breath.   Cardiovascular: Negative for chest pain and leg swelling.  Gastrointestinal: Negative for abdominal pain, diarrhea, nausea and vomiting.  Genitourinary: Negative for difficulty urinating and dysuria.  Musculoskeletal: Negative for joint swelling and myalgias.  Skin: Negative for color change and rash.  Neurological: Negative for dizziness, light-headedness and headaches.  Psychiatric/Behavioral: Negative for agitation and dysphoric mood.       Objective:     Blood pressure rechecked by me:  134/68 (standing) and 128-134/64 lying.    Physical Exam  Constitutional: He appears well-developed and well-nourished. No distress.  HENT:  Nose: Nose normal.  Mouth/Throat: Oropharynx is clear and moist.  Neck: Neck supple. No thyromegaly present.  Cardiovascular: Normal rate and regular rhythm.  Pulmonary/Chest: Effort normal and breath sounds normal. No respiratory distress.  Abdominal: Soft. Bowel sounds are normal. There is no tenderness.  Musculoskeletal: He exhibits no edema or tenderness.  Lymphadenopathy:    He has no cervical adenopathy.  Skin: No rash noted. No erythema.  Psychiatric: He has a normal mood and affect. His behavior is normal.    BP (!) 154/76 (BP Location: Left Arm, Patient Position: Sitting, Cuff Size: Large)   Pulse 78   Temp 98.5 F (36.9 C) (Oral)   Wt 236 lb 2 oz (107.1 kg)   SpO2 97%   BMI 33.88 kg/m  Wt Readings from Last 3 Encounters:  02/25/18 236 lb 2 oz (107.1 kg)  01/10/18 236 lb 9.6 oz (107.3 kg)  10/03/17 233 lb 6.4 oz (105.9 kg)     Lab Results  Component Value Date   WBC 6.8 10/25/2017    HGB 13.3 10/25/2017   HCT 40.7 10/25/2017   PLT 271.0 10/25/2017   GLUCOSE 152 (H) 10/25/2017   CHOL 128 10/25/2017   TRIG 119.0 10/25/2017   HDL 45.30 10/25/2017   LDLCALC 59 10/25/2017   ALT 18 10/25/2017   AST 17 10/25/2017   NA 137 10/25/2017   K 4.0 10/25/2017   CL 103 10/25/2017   CREATININE 0.82 10/25/2017   BUN  20 10/25/2017   CO2 26 10/25/2017   TSH 2.68 06/12/2017   PSA 0.52 07/07/2015   HGBA1C 7.6 (H) 10/25/2017   MICROALBUR 2.0 (H) 06/12/2017       Assessment & Plan:   Problem List Items Addressed This Visit    Anemia    Last hgb wnl.  Follow.       B12 deficiency    Continue B12 injections.       Diabetes mellitus ([place])    Reports no low sugars.  Eating well.  Follow.       HYPERTENSION, BENIGN    Added amlodipine last visit.  Pressures as outlined.  Feels weak in the am since starting.  Stop amlodipine.  Follow pressures.  Follow metabolic panel.  See if symptoms improved.  Feel blood pressure will be better since back pain controlled.        Myasthenia gravis ([place])    Has been stable.  On cellcept.  Seeing neurology.       SOB (shortness of breath)    SOB with exertion.  EKG - SR with no acute ischemic changes.  Discussed with him regarding further w/up.  Will have cardiology evaluate for question of need for further cardiac w/up.        Relevant Orders   EKG 12-Lead (Completed)       Einar Pheasant, MD

## 2018-02-27 DIAGNOSIS — G8929 Other chronic pain: Secondary | ICD-10-CM | POA: Diagnosis not present

## 2018-02-27 DIAGNOSIS — M545 Low back pain: Secondary | ICD-10-CM | POA: Diagnosis not present

## 2018-03-01 ENCOUNTER — Encounter: Payer: Self-pay | Admitting: Internal Medicine

## 2018-03-01 NOTE — Assessment & Plan Note (Signed)
Has been stable.  On cellcept.  Seeing neurology.

## 2018-03-01 NOTE — Assessment & Plan Note (Signed)
SOB with exertion.  EKG - SR with no acute ischemic changes.  Discussed with him regarding further w/up.  Will have cardiology evaluate for question of need for further cardiac w/up.

## 2018-03-01 NOTE — Assessment & Plan Note (Signed)
Reports no low sugars.  Eating well.  Follow.

## 2018-03-01 NOTE — Assessment & Plan Note (Addendum)
Added amlodipine last visit.  Pressures as outlined.  Feels weak in the am since starting.  Stop amlodipine.  Follow pressures.  Follow metabolic panel.  See if symptoms improved.  Feel blood pressure will be better since back pain controlled.

## 2018-03-01 NOTE — Assessment & Plan Note (Signed)
Continue B12 injections.   

## 2018-03-01 NOTE — Assessment & Plan Note (Signed)
Last hgb wnl.  Follow.   

## 2018-03-04 DIAGNOSIS — G8929 Other chronic pain: Secondary | ICD-10-CM | POA: Diagnosis not present

## 2018-03-04 DIAGNOSIS — M545 Low back pain: Secondary | ICD-10-CM | POA: Diagnosis not present

## 2018-03-05 ENCOUNTER — Ambulatory Visit (INDEPENDENT_AMBULATORY_CARE_PROVIDER_SITE_OTHER): Payer: Medicare Other | Admitting: *Deleted

## 2018-03-05 ENCOUNTER — Encounter: Payer: Self-pay | Admitting: Internal Medicine

## 2018-03-05 DIAGNOSIS — E538 Deficiency of other specified B group vitamins: Secondary | ICD-10-CM | POA: Diagnosis not present

## 2018-03-05 MED ORDER — CYANOCOBALAMIN 1000 MCG/ML IJ SOLN
1000.0000 ug | Freq: Once | INTRAMUSCULAR | Status: AC
  Administered 2018-03-05: 1000 ug via INTRAMUSCULAR

## 2018-03-05 NOTE — Progress Notes (Signed)
Patient presented for B 12 injection to right deltoid, patient voiced no concerns nor showed any signs of distress during injection. 

## 2018-03-06 DIAGNOSIS — M545 Low back pain: Secondary | ICD-10-CM | POA: Diagnosis not present

## 2018-03-06 DIAGNOSIS — G8929 Other chronic pain: Secondary | ICD-10-CM | POA: Diagnosis not present

## 2018-03-10 DIAGNOSIS — M62830 Muscle spasm of back: Secondary | ICD-10-CM | POA: Diagnosis not present

## 2018-03-10 DIAGNOSIS — M5033 Other cervical disc degeneration, cervicothoracic region: Secondary | ICD-10-CM | POA: Diagnosis not present

## 2018-03-10 DIAGNOSIS — M9901 Segmental and somatic dysfunction of cervical region: Secondary | ICD-10-CM | POA: Diagnosis not present

## 2018-03-10 DIAGNOSIS — M9902 Segmental and somatic dysfunction of thoracic region: Secondary | ICD-10-CM | POA: Diagnosis not present

## 2018-03-13 DIAGNOSIS — M545 Low back pain: Secondary | ICD-10-CM | POA: Diagnosis not present

## 2018-03-13 DIAGNOSIS — G8929 Other chronic pain: Secondary | ICD-10-CM | POA: Diagnosis not present

## 2018-03-18 DIAGNOSIS — G8929 Other chronic pain: Secondary | ICD-10-CM | POA: Diagnosis not present

## 2018-03-18 DIAGNOSIS — M545 Low back pain: Secondary | ICD-10-CM | POA: Diagnosis not present

## 2018-03-20 DIAGNOSIS — M5033 Other cervical disc degeneration, cervicothoracic region: Secondary | ICD-10-CM | POA: Diagnosis not present

## 2018-03-20 DIAGNOSIS — M9902 Segmental and somatic dysfunction of thoracic region: Secondary | ICD-10-CM | POA: Diagnosis not present

## 2018-03-20 DIAGNOSIS — M545 Low back pain: Secondary | ICD-10-CM | POA: Diagnosis not present

## 2018-03-20 DIAGNOSIS — M62830 Muscle spasm of back: Secondary | ICD-10-CM | POA: Diagnosis not present

## 2018-03-20 DIAGNOSIS — M9901 Segmental and somatic dysfunction of cervical region: Secondary | ICD-10-CM | POA: Diagnosis not present

## 2018-03-20 DIAGNOSIS — G8929 Other chronic pain: Secondary | ICD-10-CM | POA: Diagnosis not present

## 2018-03-25 DIAGNOSIS — G8929 Other chronic pain: Secondary | ICD-10-CM | POA: Diagnosis not present

## 2018-03-25 DIAGNOSIS — M545 Low back pain: Secondary | ICD-10-CM | POA: Diagnosis not present

## 2018-03-27 DIAGNOSIS — G8929 Other chronic pain: Secondary | ICD-10-CM | POA: Diagnosis not present

## 2018-03-27 DIAGNOSIS — M545 Low back pain: Secondary | ICD-10-CM | POA: Diagnosis not present

## 2018-04-01 DIAGNOSIS — G8929 Other chronic pain: Secondary | ICD-10-CM | POA: Diagnosis not present

## 2018-04-01 DIAGNOSIS — M545 Low back pain: Secondary | ICD-10-CM | POA: Diagnosis not present

## 2018-04-02 DIAGNOSIS — M9901 Segmental and somatic dysfunction of cervical region: Secondary | ICD-10-CM | POA: Diagnosis not present

## 2018-04-02 DIAGNOSIS — M62830 Muscle spasm of back: Secondary | ICD-10-CM | POA: Diagnosis not present

## 2018-04-02 DIAGNOSIS — M9902 Segmental and somatic dysfunction of thoracic region: Secondary | ICD-10-CM | POA: Diagnosis not present

## 2018-04-02 DIAGNOSIS — M5033 Other cervical disc degeneration, cervicothoracic region: Secondary | ICD-10-CM | POA: Diagnosis not present

## 2018-04-04 ENCOUNTER — Telehealth: Payer: Self-pay | Admitting: Internal Medicine

## 2018-04-04 NOTE — Telephone Encounter (Signed)
Copied from [place] [id number]. Topic: Quick Communication - Rx Refill/Question >> Apr 04, 2018 11:18 AM Oliver Pila B wrote: Medication: glucose blood (ACCU-CHEK GUIDE) test strip [[id number]] , ACCU-CHEK FASTCLIX LANCETS MISC [[id number]]   Contact pt if needed  Has the patient contacted their pharmacy? Yes.   (Agent: If no, request that the patient contact the pharmacy for the refill.) (Agent: If yes, when and what did the pharmacy advise?)  Preferred Pharmacy (with phone number or street name): [facility]  Agent: Please be advised that RX refills may take up to 3 business days. We ask that you follow-up with your pharmacy.

## 2018-04-07 ENCOUNTER — Other Ambulatory Visit: Payer: Self-pay | Admitting: *Deleted

## 2018-04-07 DIAGNOSIS — M9901 Segmental and somatic dysfunction of cervical region: Secondary | ICD-10-CM | POA: Diagnosis not present

## 2018-04-07 DIAGNOSIS — M62830 Muscle spasm of back: Secondary | ICD-10-CM | POA: Diagnosis not present

## 2018-04-07 DIAGNOSIS — M5033 Other cervical disc degeneration, cervicothoracic region: Secondary | ICD-10-CM | POA: Diagnosis not present

## 2018-04-07 DIAGNOSIS — M9902 Segmental and somatic dysfunction of thoracic region: Secondary | ICD-10-CM | POA: Diagnosis not present

## 2018-04-07 MED ORDER — ACCU-CHEK FASTCLIX LANCETS MISC: 12 refills | Status: DC

## 2018-04-07 MED ORDER — GLUCOSE BLOOD VI STRP: ORAL_STRIP | 12 refills | Status: DC

## 2018-04-07 NOTE — Telephone Encounter (Signed)
Testing supplies sent to new pharmacy per patient request.

## 2018-04-08 ENCOUNTER — Ambulatory Visit: Payer: Medicare Other

## 2018-04-08 DIAGNOSIS — M545 Low back pain: Secondary | ICD-10-CM | POA: Diagnosis not present

## 2018-04-08 DIAGNOSIS — G8929 Other chronic pain: Secondary | ICD-10-CM | POA: Diagnosis not present

## 2018-04-09 DIAGNOSIS — M5033 Other cervical disc degeneration, cervicothoracic region: Secondary | ICD-10-CM | POA: Diagnosis not present

## 2018-04-09 DIAGNOSIS — M9901 Segmental and somatic dysfunction of cervical region: Secondary | ICD-10-CM | POA: Diagnosis not present

## 2018-04-09 DIAGNOSIS — M62830 Muscle spasm of back: Secondary | ICD-10-CM | POA: Diagnosis not present

## 2018-04-09 DIAGNOSIS — M9902 Segmental and somatic dysfunction of thoracic region: Secondary | ICD-10-CM | POA: Diagnosis not present

## 2018-04-11 ENCOUNTER — Other Ambulatory Visit (INDEPENDENT_AMBULATORY_CARE_PROVIDER_SITE_OTHER): Payer: Medicare Other

## 2018-04-11 ENCOUNTER — Other Ambulatory Visit: Payer: Medicare Other

## 2018-04-11 ENCOUNTER — Encounter: Payer: Self-pay | Admitting: Internal Medicine

## 2018-04-11 DIAGNOSIS — E119 Type 2 diabetes mellitus without complications: Secondary | ICD-10-CM | POA: Diagnosis not present

## 2018-04-11 DIAGNOSIS — E7800 Pure hypercholesterolemia, unspecified: Secondary | ICD-10-CM

## 2018-04-11 LAB — BASIC METABOLIC PANEL
BUN: 16 mg/dL (ref 6–23)
CALCIUM: 9.8 mg/dL (ref 8.4–10.5)
CHLORIDE: 98 meq/L (ref 96–112)
CO2: 27 meq/L (ref 19–32)
Creatinine, Ser: 0.79 mg/dL (ref 0.40–1.50)
GFR: 98.04 mL/min (ref >60.00)
GLUCOSE: 194 mg/dL — AB (ref 70–99)
POTASSIUM: 5.3 meq/L — AB (ref 3.5–5.1)
SODIUM: 133 meq/L — AB (ref 135–145)

## 2018-04-11 LAB — HEPATIC FUNCTION PANEL
ALBUMIN: 3.9 g/dL (ref 3.5–5.2)
ALK PHOS: 35 U/L — AB (ref 39–117)
ALT: 24 U/L (ref 0–53)
AST: 22 U/L (ref 0–37)
Bilirubin, Direct: 0.2 mg/dL (ref 0.0–0.3)
Total Bilirubin: 1 mg/dL (ref 0.2–1.2)
Total Protein: 7.2 g/dL (ref 6.0–8.3)

## 2018-04-11 LAB — LIPID PANEL
CHOL/HDL RATIO: 3
Cholesterol: 135 mg/dL (ref 0–200)
HDL: 39 mg/dL — AB (ref >39.00)
LDL Cholesterol: 67 mg/dL (ref 0–99)
NonHDL: 96.41
Triglycerides: 149 mg/dL (ref 0.0–149.0)
VLDL: 29.8 mg/dL (ref 0.0–40.0)

## 2018-04-11 LAB — HEMOGLOBIN A1C: Hgb A1c MFr Bld: 8.9 % — ABNORMAL HIGH (ref 4.6–6.5)

## 2018-04-11 NOTE — Telephone Encounter (Signed)
Patient is requesting a DWO form filled out to be sent to his pharmacy. He would like a call when the form is completed, CB#[phone].  Medications:Accucheck Fastclix Lancets and Accucheck Guide Test Strips  Pharmacy: [facility] [place] [place] [phone] (Phone) [phone] (Fax)

## 2018-04-11 NOTE — Telephone Encounter (Signed)
Spoke with [facility] and patient. Waiting on forms to be faxed over

## 2018-04-11 NOTE — Telephone Encounter (Signed)
I do not have form. Spoke with [facility] and gave them the fax number in the pod to fax form to me. Patient is aware that I have talked with pharmacy and waiting on forms.

## 2018-04-11 NOTE — Telephone Encounter (Signed)
Noted  

## 2018-04-11 NOTE — Telephone Encounter (Signed)
Please advise 

## 2018-04-11 NOTE — Telephone Encounter (Signed)
Do you have the form?  Need to get this clarified.

## 2018-04-11 NOTE — Telephone Encounter (Signed)
Copied from [place] [id number]. Topic: General - Other >> Apr 11, 2018 11:26 AM Keene Breath wrote: Reason for CRM: Patient called to request doctor to fill out a DWO form to be sent to his drug store, [facility] [place], [place] [address] [phone] (Phone) [phone] (Fax).   Medications are:  ACCU-CHEK FASTCLIX LANCETS MISC [id number]  and glucose blood (ACCU-CHEK GUIDE) test strip [[id number]].  Patient would like for the office to call him once the form is filled out, [phone].

## 2018-04-14 ENCOUNTER — Ambulatory Visit: Payer: Medicare Other | Admitting: Internal Medicine

## 2018-04-14 ENCOUNTER — Ambulatory Visit: Payer: Medicare Other

## 2018-04-14 DIAGNOSIS — M5033 Other cervical disc degeneration, cervicothoracic region: Secondary | ICD-10-CM | POA: Diagnosis not present

## 2018-04-14 DIAGNOSIS — M9901 Segmental and somatic dysfunction of cervical region: Secondary | ICD-10-CM | POA: Diagnosis not present

## 2018-04-14 DIAGNOSIS — M62830 Muscle spasm of back: Secondary | ICD-10-CM | POA: Diagnosis not present

## 2018-04-14 DIAGNOSIS — M545 Low back pain: Secondary | ICD-10-CM | POA: Diagnosis not present

## 2018-04-14 DIAGNOSIS — G8929 Other chronic pain: Secondary | ICD-10-CM | POA: Diagnosis not present

## 2018-04-14 DIAGNOSIS — M9902 Segmental and somatic dysfunction of thoracic region: Secondary | ICD-10-CM | POA: Diagnosis not present

## 2018-04-15 NOTE — Telephone Encounter (Signed)
-----   Message from Carlean Jews, [facility] sent at 04/14/2018  1:45 PM EDT ----- Regarding: RE: question Yes! Metformin XR 500 mg daily, can increase to 500 mg BID.   Chrys Racer ----- Message ----- From: Einar Pheasant, MD Sent: 04/14/2018   5:48 AM To: Carlean Jews, RPH Subject: question                                       This pt is 40.  He has had a1c's in the 7-8 range for a long time.  Given age, he has continued on no medication and just controlling with diet.  This last a1c has increased.  Given level, I am going to discuss treatment with him.  I know he is 71, but given renal function normal, would you just start metformin?  Let me know what you think.    Thanks.   Einar Pheasant

## 2018-04-16 ENCOUNTER — Encounter: Payer: Self-pay | Admitting: Internal Medicine

## 2018-04-16 ENCOUNTER — Ambulatory Visit (INDEPENDENT_AMBULATORY_CARE_PROVIDER_SITE_OTHER): Payer: Medicare Other | Admitting: Internal Medicine

## 2018-04-16 VITALS — BP 132/70 | HR 63 | Temp 98.4°F | Resp 18 | Wt 235.2 lb

## 2018-04-16 DIAGNOSIS — I10 Essential (primary) hypertension: Secondary | ICD-10-CM

## 2018-04-16 DIAGNOSIS — E875 Hyperkalemia: Secondary | ICD-10-CM

## 2018-04-16 DIAGNOSIS — G7000 Myasthenia gravis without (acute) exacerbation: Secondary | ICD-10-CM

## 2018-04-16 DIAGNOSIS — E7800 Pure hypercholesterolemia, unspecified: Secondary | ICD-10-CM

## 2018-04-16 DIAGNOSIS — N400 Enlarged prostate without lower urinary tract symptoms: Secondary | ICD-10-CM | POA: Diagnosis not present

## 2018-04-16 DIAGNOSIS — Z125 Encounter for screening for malignant neoplasm of prostate: Secondary | ICD-10-CM

## 2018-04-16 DIAGNOSIS — E119 Type 2 diabetes mellitus without complications: Secondary | ICD-10-CM

## 2018-04-16 DIAGNOSIS — D649 Anemia, unspecified: Secondary | ICD-10-CM | POA: Diagnosis not present

## 2018-04-16 DIAGNOSIS — E538 Deficiency of other specified B group vitamins: Secondary | ICD-10-CM | POA: Diagnosis not present

## 2018-04-16 LAB — VITAMIN B12: VITAMIN B 12: 645 pg/mL (ref 211–911)

## 2018-04-16 LAB — PSA, MEDICARE: PSA: 0.02 ng/mL — AB (ref 0.10–4.00)

## 2018-04-16 LAB — POTASSIUM: Potassium: 5 mEq/L (ref 3.5–5.1)

## 2018-04-16 MED ORDER — AMLODIPINE BESYLATE 2.5 MG PO TABS: 2.5000 mg | ORAL_TABLET | Freq: Every day | ORAL | 2 refills | Status: DC

## 2018-04-16 MED ORDER — CYANOCOBALAMIN 1000 MCG/ML IJ SOLN
1000.0000 ug | Freq: Once | INTRAMUSCULAR | Status: AC
  Administered 2018-04-16: 1000 ug via INTRAMUSCULAR

## 2018-04-16 MED ORDER — METFORMIN HCL ER 500 MG PO TB24: 500.0000 mg | ORAL_TABLET | Freq: Every day | ORAL | 2 refills | Status: DC

## 2018-04-16 NOTE — Progress Notes (Signed)
Patient ID: [name], male   DOB: [birth year], 84 y.o.   MRN: [id number]   Subjective:    Patient ID: DOCTOR SHEAHAN, male    DOB: 06/05/1926, 84 y.o.   MRN: [id number]  HPI  Patient here for a scheduled follow up. He has been seeing Dr Sharlet Salina for DDD (low back pain) and left hip pain.  Going to therapy.  Is doing better.  Tries to stay active.  No chest pain.  No sob.  No acid reflux.  No abdominal pain.  Bowels moving.  No urine change.  Discussed lab results.  Discussed starting medication.  Discussed diet and exercise.  He is agreeable to starting metformin.  He request psa check.  Discussed the need for f/u potassium.  Reviewed outside blood pressures.  Remaining elevated.     Past Medical History:  Diagnosis Date  . Allergic rhinitis   . BPH (benign prostatic hypertrophy)   . Degenerative arthritis   . Diabetes mellitus ([place])   . GERD (gastroesophageal reflux disease)   . HTN (hypertension)   . Hypercholesterolemia   . HYPERTENSION, BENIGN 08/15/2010   Qualifier: Diagnosis of  By: Rockey Situ MD, Tim    . Iron deficiency anemia   . Skin cancer   . TIA (transient ischemic attack) 09/13/2015  . Urinary outflow obstruction    mild   Past Surgical History:  Procedure Laterality Date  . PILONIDAL CYST EXCISION  1951   removal  . [place]  . SKIN CANCER EXCISION     multiple  . SQUAMOUS CELL CARCINOMA EXCISION     behind head  . TONSILLECTOMY AND ADENOIDECTOMY  1938   Family History  Problem Relation Age of Onset  . Heart disease Father        heart atack  . Alzheimer's disease Mother   . CVA Brother    Social History   Socioeconomic History  . Marital status: Married    Spouse name: Not on file  . Number of children: Not on file  . Years of education: Not on file  . Highest education level: Not on file  Occupational History  . Not on file  Social Needs  . Financial resource strain: Not on file  . Food insecurity:    Worry: Not on  file    Inability: Not on file  . Transportation needs:    Medical: Not on file    Non-medical: Not on file  Tobacco Use  . Smoking status: Never Smoker  . Smokeless tobacco: Never Used  Substance and Sexual Activity  . Alcohol use: No    Alcohol/week: 0.0 oz  . Drug use: No  . Sexual activity: Not on file  Lifestyle  . Physical activity:    Days per week: Not on file    Minutes per session: Not on file  . Stress: Not on file  Relationships  . Social connections:    Talks on phone: Not on file    Gets together: Not on file    Attends religious service: Not on file    Active member of club or organization: Not on file    Attends meetings of clubs or organizations: Not on file    Relationship status: Not on file  Other Topics Concern  . Not on file  Social History Narrative   Retired, married. Walks everyday          Outpatient Encounter Medications as of 04/16/2018  Medication Sig  . ACCU-CHEK  FASTCLIX LANCETS MISC Use daily as directed. DX E11.9  . amLODipine (NORVASC) 2.5 MG tablet Take 1 tablet (2.5 mg total) by mouth daily.  Marland Kitchen aspirin (ASPIR-LOW) 81 MG EC tablet Take 81 mg by mouth daily.    . calcium-vitamin D (OSCAL 500/200 D-3) 500-200 MG-UNIT per tablet Take 1 tablet by mouth 2 (two) times daily.   . cyanocobalamin (,VITAMIN B-12,) 1000 MCG/ML injection Inject 1,000 mcg into the muscle every 30 (thirty) days.  . finasteride (PROSCAR) 5 MG tablet Take 1 tablet (5 mg total) by mouth daily.  Marland Kitchen glucose blood (ACCU-CHEK GUIDE) test strip Use twice daily to check blood sugars Dx E11.9  . losartan (COZAAR) 100 MG tablet Take 1 tablet (100 mg total) by mouth daily.  . metFORMIN (GLUCOPHAGE XR) 500 MG 24 hr tablet Take 1 tablet (500 mg total) by mouth daily with breakfast.  . mycophenolate (CELLCEPT) 500 MG tablet Take 2 tablets (1,000 mg total) by mouth 2 (two) times daily.  . tamsulosin (FLOMAX) 0.4 MG CAPS capsule Take 1 capsule (0.4 mg total) by mouth 2 (two) times  daily.  . [DISCONTINUED] amLODipine (NORVASC) 2.5 MG tablet TAKE 1 TABLET BY MOUTH ONCE A DAY  . [DISCONTINUED] lovastatin (MEVACOR) 20 MG tablet TAKE ONE TABLET AT BEDTIME  . [EXPIRED] cyanocobalamin ((VITAMIN B-12)) injection 1,000 mcg    No facility-administered encounter medications on file as of 04/16/2018.     Review of Systems  Constitutional: Negative for appetite change and unexpected weight change.  HENT: Negative for congestion and sinus pressure.   Respiratory: Negative for cough, chest tightness and shortness of breath.   Cardiovascular: Negative for chest pain, palpitations and leg swelling.  Gastrointestinal: Negative for abdominal pain, diarrhea, nausea and vomiting.  Genitourinary: Negative for difficulty urinating and dysuria.  Musculoskeletal: Negative for joint swelling.       Back and left hip pain better.    Skin: Negative for color change and rash.  Neurological: Negative for dizziness, light-headedness and headaches.  Psychiatric/Behavioral: Negative for agitation and dysphoric mood.       Objective:     Blood pressure rechecked by me:  150/78  Physical Exam  Constitutional: He appears well-developed and well-nourished. No distress.  HENT:  Nose: Nose normal.  Mouth/Throat: Oropharynx is clear and moist.  Neck: Neck supple. No thyromegaly present.  Cardiovascular: Normal rate and regular rhythm.  Pulmonary/Chest: Effort normal and breath sounds normal. No respiratory distress.  Abdominal: Soft. Bowel sounds are normal. There is no tenderness.  Musculoskeletal: He exhibits no edema or tenderness.  Lymphadenopathy:    He has no cervical adenopathy.  Skin: No rash noted. No erythema.  Psychiatric: He has a normal mood and affect. His behavior is normal.    BP 132/70 (BP Location: Left Arm, Patient Position: Sitting, Cuff Size: Normal)   Pulse 63   Temp 98.4 F (36.9 C) (Oral)   Resp 18   Wt 235 lb 3.2 oz (106.7 kg)   SpO2 97%   BMI 33.75 kg/m    Wt Readings from Last 3 Encounters:  04/16/18 235 lb 3.2 oz (106.7 kg)  02/25/18 236 lb 2 oz (107.1 kg)  01/10/18 236 lb 9.6 oz (107.3 kg)     Lab Results  Component Value Date   WBC 6.8 10/25/2017   HGB 13.3 10/25/2017   HCT 40.7 10/25/2017   PLT 271.0 10/25/2017   GLUCOSE 194 (H) 04/11/2018   CHOL 135 04/11/2018   TRIG 149.0 04/11/2018   HDL 39.00 (L)  04/11/2018   LDLCALC 67 04/11/2018   ALT 24 04/11/2018   AST 22 04/11/2018   NA 133 (L) 04/11/2018   K 5.0 04/16/2018   CL 98 04/11/2018   CREATININE 0.79 04/11/2018   BUN 16 04/11/2018   CO2 27 04/11/2018   TSH 2.68 06/12/2017   PSA 0.02 (L) 04/16/2018   HGBA1C 8.9 (H) 04/11/2018   MICROALBUR 2.0 (H) 06/12/2017       Assessment & Plan:   Problem List Items Addressed This Visit    Anemia    Follow cbc.        Relevant Medications   cyanocobalamin ((VITAMIN B-12)) injection 1,000 mcg (Completed)   B12 deficiency    Continue B12 injections.        Relevant Medications   cyanocobalamin ((VITAMIN B-12)) injection 1,000 mcg (Completed)   Other Relevant Orders   Vitamin B12 (Completed)   BPH (benign prostatic hyperplasia)    Has been previously followed by urology.  On finasteride and flomax.  Request psa check today.        Diabetes mellitus ([facility])    Elevated a1c.  Sugars reviewed.  Discussed low carb diet and exercise.  Start metformin 500mg  q day.  Follow.        Relevant Medications   metFORMIN (GLUCOPHAGE XR) 500 MG 24 hr tablet   Hypercholesterolemia    On lovastatin.  Low cholesterol diet and exercise.  Follow lipid panel and liver function tests.        Relevant Medications   amLODipine (NORVASC) 2.5 MG tablet   HYPERTENSION, BENIGN    Restart amlodipine 2.5mg  q day.  Follow pressures.  Follow metabolic panel.        Relevant Medications   amLODipine (NORVASC) 2.5 MG tablet   Myasthenia gravis ([place])    On cellcept.  Followed by neurology.  Stable.        Other Visit Diagnoses     Hyperkalemia    -  Primary   Potassium slightly elevated.  Recheck potassium today.     Relevant Orders   Potassium (Completed)   Prostate cancer screening       Relevant Orders   PSA, Medicare (Completed)       Einar Pheasant, MD

## 2018-04-17 ENCOUNTER — Other Ambulatory Visit: Payer: Self-pay | Admitting: Internal Medicine

## 2018-04-17 DIAGNOSIS — E875 Hyperkalemia: Secondary | ICD-10-CM

## 2018-04-17 NOTE — Progress Notes (Signed)
Order placed for f/u potassium.  

## 2018-04-21 ENCOUNTER — Other Ambulatory Visit: Payer: Self-pay | Admitting: Internal Medicine

## 2018-04-21 ENCOUNTER — Other Ambulatory Visit: Payer: Self-pay | Admitting: Physical Medicine and Rehabilitation

## 2018-04-21 DIAGNOSIS — M9902 Segmental and somatic dysfunction of thoracic region: Secondary | ICD-10-CM | POA: Diagnosis not present

## 2018-04-21 DIAGNOSIS — M5416 Radiculopathy, lumbar region: Secondary | ICD-10-CM

## 2018-04-21 DIAGNOSIS — M9901 Segmental and somatic dysfunction of cervical region: Secondary | ICD-10-CM | POA: Diagnosis not present

## 2018-04-21 DIAGNOSIS — M5033 Other cervical disc degeneration, cervicothoracic region: Secondary | ICD-10-CM | POA: Diagnosis not present

## 2018-04-21 DIAGNOSIS — M62830 Muscle spasm of back: Secondary | ICD-10-CM | POA: Diagnosis not present

## 2018-04-24 DIAGNOSIS — G7000 Myasthenia gravis without (acute) exacerbation: Secondary | ICD-10-CM | POA: Diagnosis not present

## 2018-04-27 ENCOUNTER — Encounter: Payer: Self-pay | Admitting: Internal Medicine

## 2018-04-27 NOTE — Assessment & Plan Note (Signed)
On cellcept.  Followed by neurology.  Stable.

## 2018-04-27 NOTE — Assessment & Plan Note (Signed)
Follow cbc.  

## 2018-04-27 NOTE — Assessment & Plan Note (Signed)
Restart amlodipine 2.5mg  q day.  Follow pressures.  Follow metabolic panel.

## 2018-04-27 NOTE — Assessment & Plan Note (Signed)
Continue B12 injections.   

## 2018-04-27 NOTE — Assessment & Plan Note (Signed)
Has been previously followed by urology.  On finasteride and flomax.  Request psa check today.

## 2018-04-27 NOTE — Assessment & Plan Note (Signed)
Elevated a1c.  Sugars reviewed.  Discussed low carb diet and exercise.  Start metformin 500mg  q day.  Follow.

## 2018-04-27 NOTE — Assessment & Plan Note (Signed)
On lovastatin.  Low cholesterol diet and exercise.  Follow lipid panel and liver function tests.   

## 2018-04-28 DIAGNOSIS — L578 Other skin changes due to chronic exposure to nonionizing radiation: Secondary | ICD-10-CM | POA: Diagnosis not present

## 2018-04-28 DIAGNOSIS — M62830 Muscle spasm of back: Secondary | ICD-10-CM | POA: Diagnosis not present

## 2018-04-28 DIAGNOSIS — M9901 Segmental and somatic dysfunction of cervical region: Secondary | ICD-10-CM | POA: Diagnosis not present

## 2018-04-28 DIAGNOSIS — M5033 Other cervical disc degeneration, cervicothoracic region: Secondary | ICD-10-CM | POA: Diagnosis not present

## 2018-04-28 DIAGNOSIS — M9902 Segmental and somatic dysfunction of thoracic region: Secondary | ICD-10-CM | POA: Diagnosis not present

## 2018-04-28 DIAGNOSIS — L720 Epidermal cyst: Secondary | ICD-10-CM | POA: Diagnosis not present

## 2018-04-28 DIAGNOSIS — L570 Actinic keratosis: Secondary | ICD-10-CM | POA: Diagnosis not present

## 2018-05-01 ENCOUNTER — Ambulatory Visit: Payer: Self-pay | Admitting: Podiatry

## 2018-05-01 ENCOUNTER — Ambulatory Visit
Admission: RE | Admit: 2018-05-01 | Discharge: 2018-05-01 | Disposition: A | Discharge status: Home / Self Care | Payer: Medicare Other | Source: Ambulatory Visit | Attending: Physical Medicine and Rehabilitation | Admitting: Physical Medicine and Rehabilitation

## 2018-05-01 DIAGNOSIS — M5116 Intervertebral disc disorders with radiculopathy, lumbar region: Secondary | ICD-10-CM | POA: Diagnosis not present

## 2018-05-01 DIAGNOSIS — M5416 Radiculopathy, lumbar region: Secondary | ICD-10-CM

## 2018-05-01 DIAGNOSIS — M4727 Other spondylosis with radiculopathy, lumbosacral region: Secondary | ICD-10-CM | POA: Diagnosis not present

## 2018-05-01 DIAGNOSIS — M2548 Effusion, other site: Secondary | ICD-10-CM | POA: Diagnosis not present

## 2018-05-01 DIAGNOSIS — M48061 Spinal stenosis, lumbar region without neurogenic claudication: Secondary | ICD-10-CM | POA: Diagnosis not present

## 2018-05-02 DIAGNOSIS — M5136 Other intervertebral disc degeneration, lumbar region: Secondary | ICD-10-CM | POA: Diagnosis not present

## 2018-05-02 DIAGNOSIS — M48061 Spinal stenosis, lumbar region without neurogenic claudication: Secondary | ICD-10-CM | POA: Diagnosis not present

## 2018-05-02 DIAGNOSIS — M5416 Radiculopathy, lumbar region: Secondary | ICD-10-CM | POA: Diagnosis not present

## 2018-05-05 ENCOUNTER — Ambulatory Visit: Payer: Medicare Other

## 2018-05-05 ENCOUNTER — Encounter: Payer: Self-pay | Admitting: Podiatry

## 2018-05-05 ENCOUNTER — Ambulatory Visit (INDEPENDENT_AMBULATORY_CARE_PROVIDER_SITE_OTHER): Payer: Medicare Other | Admitting: Podiatry

## 2018-05-05 DIAGNOSIS — M79674 Pain in right toe(s): Secondary | ICD-10-CM | POA: Diagnosis not present

## 2018-05-05 DIAGNOSIS — B351 Tinea unguium: Secondary | ICD-10-CM | POA: Diagnosis not present

## 2018-05-05 DIAGNOSIS — M79675 Pain in left toe(s): Secondary | ICD-10-CM | POA: Diagnosis not present

## 2018-05-05 DIAGNOSIS — E119 Type 2 diabetes mellitus without complications: Secondary | ICD-10-CM

## 2018-05-05 NOTE — Progress Notes (Signed)
Complaint:  Visit Type: Patient presents to the office for  preventative foot care services. Complaint: Patient states" my nails have grown long and thick and become painful to walk and wear shoes" Patient has been diagnosed with DM and does not take medication for diabetes.. The patient presents for preventative foot care services. No changes to ROS  Podiatric Exam: Vascular: dorsalis pedis and posterior tibial pulses are palpable bilateral. Capillary return is immediate. Temperature gradient is WNL. Skin turgor WNL  Sensorium: Normal Semmes Weinstein monofilament test. Normal tactile sensation bilaterally. Nail Exam: Pt has thick disfigured discolored nails with subungual debris noted bilateral entire nail hallux through fifth toenails Ulcer Exam: There is no evidence of ulcer or pre-ulcerative changes or infection. Orthopedic Exam: Muscle tone and strength are WNL. No limitations in general ROM. No crepitus or effusions noted. Foot type and digits show no abnormalities. Bony prominences are unremarkable. Skin: No Porokeratosis. No infection or ulcers  Diagnosis:  Onychomycosis, , Pain in right toe, pain in left toes  Treatment & Plan Procedures and Treatment: Consent by patient was obtained for treatment procedures.   Debridement of mycotic and hypertrophic toenails, 1 through 5 bilateral and clearing of subungual debris. No ulceration, no infection noted.  Return Visit-Office Procedure: Patient instructed to return to the office for a follow up visit 3 months for continued evaluation and treatment.    Gardiner Barefoot DPM

## 2018-05-06 ENCOUNTER — Telehealth: Payer: Self-pay | Admitting: *Deleted

## 2018-05-06 ENCOUNTER — Encounter: Payer: Self-pay | Admitting: Internal Medicine

## 2018-05-06 DIAGNOSIS — D649 Anemia, unspecified: Secondary | ICD-10-CM

## 2018-05-06 NOTE — Telephone Encounter (Signed)
Copied from [place] [id number]. Topic: Appointment Scheduling - Scheduling Inquiry for Clinic >> May 06, 2018  3:47 PM Conception Chancy, NT wrote: Reason for CRM: patient is needing to reschedule his appointment with Dr. Nicki Reaper on 06/04/18. The first available is in the end of October. Patient would like to be seen before then. Please advise and contact patient to reschedule

## 2018-05-06 NOTE — Telephone Encounter (Signed)
Order placed for f/u cbc to be drawn with potassium check.

## 2018-05-07 ENCOUNTER — Ambulatory Visit: Payer: Medicare Other

## 2018-05-09 NOTE — Telephone Encounter (Signed)
Scheduled patient for 06/09/18 at 12. Called patient to let him know and sent my chart msg

## 2018-05-12 DIAGNOSIS — M5033 Other cervical disc degeneration, cervicothoracic region: Secondary | ICD-10-CM | POA: Diagnosis not present

## 2018-05-12 DIAGNOSIS — M9902 Segmental and somatic dysfunction of thoracic region: Secondary | ICD-10-CM | POA: Diagnosis not present

## 2018-05-12 DIAGNOSIS — M62830 Muscle spasm of back: Secondary | ICD-10-CM | POA: Diagnosis not present

## 2018-05-12 DIAGNOSIS — M9901 Segmental and somatic dysfunction of cervical region: Secondary | ICD-10-CM | POA: Diagnosis not present

## 2018-05-15 ENCOUNTER — Other Ambulatory Visit: Payer: Self-pay

## 2018-05-15 MED ORDER — LOSARTAN POTASSIUM 100 MG PO TABS: 100.0000 mg | ORAL_TABLET | Freq: Every day | ORAL | 1 refills | Status: DC

## 2018-05-21 ENCOUNTER — Ambulatory Visit (INDEPENDENT_AMBULATORY_CARE_PROVIDER_SITE_OTHER): Payer: Medicare Other | Admitting: *Deleted

## 2018-05-21 ENCOUNTER — Other Ambulatory Visit: Payer: Self-pay | Admitting: Internal Medicine

## 2018-05-21 ENCOUNTER — Other Ambulatory Visit (INDEPENDENT_AMBULATORY_CARE_PROVIDER_SITE_OTHER): Payer: Medicare Other

## 2018-05-21 ENCOUNTER — Encounter: Payer: Self-pay | Admitting: Internal Medicine

## 2018-05-21 DIAGNOSIS — E538 Deficiency of other specified B group vitamins: Secondary | ICD-10-CM | POA: Diagnosis not present

## 2018-05-21 DIAGNOSIS — E875 Hyperkalemia: Secondary | ICD-10-CM

## 2018-05-21 DIAGNOSIS — D649 Anemia, unspecified: Secondary | ICD-10-CM | POA: Diagnosis not present

## 2018-05-21 LAB — POTASSIUM: Potassium: 5 mEq/L (ref 3.5–5.1)

## 2018-05-21 LAB — CBC WITH DIFFERENTIAL/PLATELET
Basophils Absolute: 0.1 10*3/uL (ref 0.0–0.1)
Basophils Relative: 1 % (ref 0.0–3.0)
Eosinophils Absolute: 0.1 10*3/uL (ref 0.0–0.7)
Eosinophils Relative: 1.8 % (ref 0.0–5.0)
HCT: 38.6 % — ABNORMAL LOW (ref 39.0–52.0)
HEMOGLOBIN: 13.2 g/dL (ref 13.0–17.0)
LYMPHS ABS: 1.2 10*3/uL (ref 0.7–4.0)
Lymphocytes Relative: 18 % (ref 12.0–46.0)
MCHC: 34.3 g/dL (ref 30.0–36.0)
MCV: 91.2 fl (ref 78.0–100.0)
Monocytes Absolute: 0.5 10*3/uL (ref 0.1–1.0)
Monocytes Relative: 7.9 % (ref 3.0–12.0)
Neutro Abs: 4.8 10*3/uL (ref 1.4–7.7)
Neutrophils Relative %: 71.3 % (ref 43.0–77.0)
PLATELETS: 310 10*3/uL (ref 150.0–400.0)
RBC: 4.24 Mil/uL (ref 4.22–5.81)
RDW: 12.7 % (ref 11.5–15.5)
WBC: 6.7 10*3/uL (ref 4.0–10.5)

## 2018-05-21 MED ORDER — CYANOCOBALAMIN 1000 MCG/ML IJ SOLN
1000.0000 ug | Freq: Once | INTRAMUSCULAR | Status: AC
  Administered 2018-05-21: 1000 ug via INTRAMUSCULAR

## 2018-05-21 NOTE — Progress Notes (Signed)
Patient presented for B 12 injection to right deltoid, patient voiced no concerns nor showed any signs of distress during injection. 

## 2018-05-26 DIAGNOSIS — M62830 Muscle spasm of back: Secondary | ICD-10-CM | POA: Diagnosis not present

## 2018-05-26 DIAGNOSIS — M9902 Segmental and somatic dysfunction of thoracic region: Secondary | ICD-10-CM | POA: Diagnosis not present

## 2018-05-26 DIAGNOSIS — M5033 Other cervical disc degeneration, cervicothoracic region: Secondary | ICD-10-CM | POA: Diagnosis not present

## 2018-05-26 DIAGNOSIS — M9901 Segmental and somatic dysfunction of cervical region: Secondary | ICD-10-CM | POA: Diagnosis not present

## 2018-06-04 ENCOUNTER — Ambulatory Visit: Payer: Medicare Other | Admitting: Internal Medicine

## 2018-06-04 DIAGNOSIS — M5416 Radiculopathy, lumbar region: Secondary | ICD-10-CM | POA: Diagnosis not present

## 2018-06-04 DIAGNOSIS — M48061 Spinal stenosis, lumbar region without neurogenic claudication: Secondary | ICD-10-CM | POA: Diagnosis not present

## 2018-06-04 DIAGNOSIS — M5136 Other intervertebral disc degeneration, lumbar region: Secondary | ICD-10-CM | POA: Diagnosis not present

## 2018-06-09 ENCOUNTER — Encounter: Payer: Self-pay | Admitting: Internal Medicine

## 2018-06-09 ENCOUNTER — Ambulatory Visit (INDEPENDENT_AMBULATORY_CARE_PROVIDER_SITE_OTHER): Payer: Medicare Other | Admitting: Internal Medicine

## 2018-06-09 DIAGNOSIS — D649 Anemia, unspecified: Secondary | ICD-10-CM | POA: Diagnosis not present

## 2018-06-09 DIAGNOSIS — E119 Type 2 diabetes mellitus without complications: Secondary | ICD-10-CM | POA: Diagnosis not present

## 2018-06-09 DIAGNOSIS — I10 Essential (primary) hypertension: Secondary | ICD-10-CM | POA: Diagnosis not present

## 2018-06-09 DIAGNOSIS — G7000 Myasthenia gravis without (acute) exacerbation: Secondary | ICD-10-CM

## 2018-06-09 DIAGNOSIS — E538 Deficiency of other specified B group vitamins: Secondary | ICD-10-CM | POA: Diagnosis not present

## 2018-06-09 DIAGNOSIS — E7800 Pure hypercholesterolemia, unspecified: Secondary | ICD-10-CM

## 2018-06-09 MED ORDER — AMLODIPINE BESYLATE 5 MG PO TABS: 5.0000 mg | ORAL_TABLET | Freq: Every day | ORAL | 3 refills | Status: DC

## 2018-06-09 NOTE — Assessment & Plan Note (Signed)
Follow cbc.  

## 2018-06-09 NOTE — Assessment & Plan Note (Signed)
On cellcept.  Followed by neurology.  Stable.

## 2018-06-09 NOTE — Assessment & Plan Note (Signed)
On lovastatin.  Low cholesterol diet and exercise.  Follow lipid panel and liver function tests.   

## 2018-06-09 NOTE — Assessment & Plan Note (Signed)
Low carb diet and exercise.  On metformin.  Follow met b and a1c.

## 2018-06-09 NOTE — Assessment & Plan Note (Signed)
On losartan and amlodipine 2.5mg  q day.  Blood pressure elevated.  Increase amlodipine to 5mg  q day.  Follow pressures.  Follow metabolic panel.

## 2018-06-09 NOTE — Assessment & Plan Note (Signed)
Continue B12 injections.   

## 2018-06-09 NOTE — Progress Notes (Signed)
Patient ID: [name], male   DOB: 1926-05-16, 84 y.o.   MRN: [id number]   Subjective:    Patient ID: [name], male    DOB: Mar 19, 1926, 84 y.o.   MRN: [id number]  HPI  Patient here for a scheduled follow up.  States he is doing relatively well.  Planning to start exercising.  Planning to go to the gym.  No chest pain.  No sob.  No acid reflux.  No abdominal pain.  Bowels moving.  Reviewed outside blood pressure readings.  PM readings elevated.  134-180/70-80s.  Taking his medication regularly.  Takes losartan 150m in am and amlodipine 2.577min the pm.  States his am sugars are averaging 165-170.  Highest pm sugars - around 200.  Taking and tolerating metformin.     Past Medical History:  Diagnosis Date  . Allergic rhinitis   . BPH (benign prostatic hypertrophy)   . Degenerative arthritis   . Diabetes mellitus [place]  . GERD (gastroesophageal reflux disease)   . HTN (hypertension)   . Hypercholesterolemia   . HYPERTENSION, BENIGN 08/15/2010   Qualifier: Diagnosis of  By: GoRockey SituD, Tim    . Iron deficiency anemia   . Skin cancer   . TIA (transient ischemic attack) 09/13/2015  . Urinary outflow obstruction    mild   Past Surgical History:  Procedure Laterality Date  . PILONIDAL CYST EXCISION  1951   [place]     multiple  . SQUAMOUS CELL CARCINOMA EXCISION     behind head  . TONSILLECTOMY AND ADENOIDECTOMY  1938   Family History  Problem Relation Age of Onset  . Heart disease Father        heart atack  . Alzheimer's disease Mother   . CVA Brother    Social History   Socioeconomic History  . Marital status: Married    Spouse name: Not on file  . Number of children: Not on file  . Years of education: Not on file  . Highest education level: Not on file  Occupational History  . Not on file  Social Needs  . Financial resource strain: Not on file  . Food insecurity:    Worry: Not on file   Inability: Not on file  . Transportation needs:    Medical: Not on file    Non-medical: Not on file  Tobacco Use  . Smoking status: Never Smoker  . Smokeless tobacco: Never Used  Substance and Sexual Activity  . Alcohol use: No    Alcohol/week: 0.0 oz  . Drug use: No  . Sexual activity: Not on file  Lifestyle  . Physical activity:    Days per week: Not on file    Minutes per session: Not on file  . Stress: Not on file  Relationships  . Social connections:    Talks on phone: Not on file    Gets together: Not on file    Attends religious service: Not on file    Active member of club or organization: Not on file    Attends meetings of clubs or organizations: Not on file    Relationship status: Not on file  Other Topics Concern  . Not on file  Social History Narrative   Retired, married. Walks everyday          Outpatient Encounter Medications as of 06/09/2018  Medication Sig  . ACCU-CHEK FASTCLIX LANCETS MISC Use daily as  directed. DX E11.9  . aspirin (ASPIR-LOW) 81 MG EC tablet Take 81 mg by mouth daily.    . calcium-vitamin D (OSCAL 500/200 D-3) 500-200 MG-UNIT per tablet Take 1 tablet by mouth 2 (two) times daily.   . cyanocobalamin (,VITAMIN B-12,) 1000 MCG/ML injection Inject 1,000 mcg into the muscle every 30 (thirty) days.  Marland Kitchen docusate sodium (COLACE) 100 MG capsule Take by mouth.  . finasteride (PROSCAR) 5 MG tablet Take 1 tablet (5 mg total) by mouth daily.  Marland Kitchen glucose blood (ACCU-CHEK GUIDE) test strip Use twice daily to check blood sugars Dx E11.9  . HYDROcodone-acetaminophen (NORCO/VICODIN) 5-325 MG tablet   . losartan (COZAAR) 100 MG tablet Take 1 tablet (100 mg total) by mouth daily.  Marland Kitchen lovastatin (MEVACOR) 20 MG tablet TAKE ONE TABLET AT BEDTIME  . metFORMIN (GLUCOPHAGE XR) 500 MG 24 hr tablet Take 1 tablet (500 mg total) by mouth daily with breakfast.  . mycophenolate (CELLCEPT) 500 MG tablet Take 2 tablets (1,000 mg total) by mouth 2 (two) times daily.  .  tamsulosin (FLOMAX) 0.4 MG CAPS capsule Take 1 capsule (0.4 mg total) by mouth 2 (two) times daily.  . [DISCONTINUED] amLODipine (NORVASC) 2.5 MG tablet Take 1 tablet (2.5 mg total) by mouth daily.  Marland Kitchen amLODipine (NORVASC) 5 MG tablet Take 1 tablet (5 mg total) by mouth daily.   No facility-administered encounter medications on file as of 06/09/2018.     Review of Systems  Constitutional: Negative for appetite change and unexpected weight change.  HENT: Negative for congestion and sinus pressure.   Respiratory: Negative for cough, chest tightness and shortness of breath.   Cardiovascular: Negative for chest pain, palpitations and leg swelling.  Gastrointestinal: Negative for abdominal pain, diarrhea, nausea and vomiting.  Genitourinary: Negative for difficulty urinating and dysuria.  Musculoskeletal: Negative for myalgias and neck pain.  Skin: Negative for color change and rash.  Neurological: Negative for dizziness, light-headedness and headaches.  Psychiatric/Behavioral: Negative for agitation and dysphoric mood.       Objective:     Blood pressure rechecked by me:  136/62  Physical Exam  Constitutional: He appears well-developed and well-nourished. No distress.  HENT:  Nose: Nose normal.  Mouth/Throat: Oropharynx is clear and moist.  Neck: Neck supple. No thyromegaly present.  Cardiovascular: Normal rate and regular rhythm.  Pulmonary/Chest: Effort normal and breath sounds normal. No respiratory distress.  Abdominal: Soft. Bowel sounds are normal. There is no tenderness.  Musculoskeletal: He exhibits no edema or tenderness.  Lymphadenopathy:    He has no cervical adenopathy.  Skin: No rash noted. No erythema.  Psychiatric: He has a normal mood and affect. His behavior is normal.    BP 140/64 (BP Location: Left Arm, Patient Position: Sitting, Cuff Size: Large)   Pulse 74   Temp 98.1 F (36.7 C) (Oral)   Resp 16   Ht '5\' 10"'  (1.778 m)   Wt 234 lb 8 oz (106.4 kg)   SpO2  97%   BMI 33.65 kg/m  Wt Readings from Last 3 Encounters:  06/09/18 234 lb 8 oz (106.4 kg)  04/16/18 235 lb 3.2 oz (106.7 kg)  02/25/18 236 lb 2 oz (107.1 kg)     Lab Results  Component Value Date   WBC 6.7 05/21/2018   HGB 13.2 05/21/2018   HCT 38.6 (L) 05/21/2018   PLT 310.0 05/21/2018   GLUCOSE 194 (H) 04/11/2018   CHOL 135 04/11/2018   TRIG 149.0 04/11/2018   HDL 39.00 (L) 04/11/2018  LDLCALC 67 04/11/2018   ALT 24 04/11/2018   AST 22 04/11/2018   NA 133 (L) 04/11/2018   K 5.0 05/21/2018   CL 98 04/11/2018   CREATININE 0.79 04/11/2018   BUN 16 04/11/2018   CO2 27 04/11/2018   TSH 2.68 06/12/2017   PSA 0.02 (L) 04/16/2018   HGBA1C 8.9 (H) 04/11/2018   MICROALBUR 2.0 (H) 06/12/2017    Mr Lumbar Spine Wo Contrast  Addendum Date: 05/01/2018   ADDENDUM REPORT: 05/01/2018 17:12 ADDENDUM: Chronic T12, L2, L3, and L5 compression fractures. Electronically Signed   By: Logan Bores M.D.   On: 05/01/2018 17:12   Result Date: 05/01/2018 CLINICAL DATA:  Lumbar radiculopathy. Low back and left leg pain for couple of months. EXAM: MRI LUMBAR SPINE WITHOUT CONTRAST TECHNIQUE: Multiplanar, multisequence MR imaging of the lumbar spine was performed. No intravenous contrast was administered. COMPARISON:  None. FINDINGS: Segmentation:  Standard. Alignment: Mild lumbar levoscoliosis. 3 mm anterolisthesis of L3 on L4. Vertebrae: T12, L2, L3, and L5 compression fractures with mild-to-moderate vertebral body height loss and no significant marrow edema. 3 mm retropulsion of the posterosuperior T12 vertebral body. Mild periarticular marrow and soft tissue edema associated with left L5-S1 facet arthritis. Conus medullaris and cauda equina: Conus extends to the lower L1 level. Conus and cauda equina appear normal. Paraspinal and other soft tissues: Mild left lower lumbar posterior paraspinal muscle, possibly strain. Disc levels: Disc desiccation throughout the lower thoracic and lumbar spine. Mild  disc space narrowing at L2-3 and L4-5. T11-12: Mild T12 superior endplate retropulsion, mild disc bulging, and mild facet arthrosis result in borderline to mild spinal stenosis without neural foraminal stenosis. T12-L1: Mild disc bulging and moderate facet hypertrophy without stenosis. L1-2: Mild circumferential disc bulging and moderate facet hypertrophy result in minimal right neural foraminal narrowing without spinal stenosis. L2-3: Circumferential disc bulging, a broad right subarticular to foraminal disc protrusion, and moderate facet hypertrophy result in minimal right lateral recess narrowing without spinal or neural foraminal stenosis. L3-4: Anterolisthesis with slight bulging of uncovered disc, mildly prominent dorsal epidural fat, and severe facet hypertrophy result in mild-to-moderate spinal stenosis, mild bilateral lateral recess stenosis, and mild bilateral neural foraminal stenosis. There is likely facet ankylosis bilaterally. L4-5: Disc bulging, mild ligamentum flavum hypertrophy, and moderate left greater than right facet hypertrophy result in mild spinal stenosis, mild-to-moderate left greater than right lateral recess stenosis, and minimal bilateral neural foraminal stenosis. L5-S1: Disc bulging and severe left greater than right facet arthrosis result in mild left lateral recess stenosis and mild right and severe left neural foraminal stenosis with potential left L5 nerve root impingement. There is a left facet joint effusion, and a 6 x 3 mm synovial cyst is present anterior to the left facet joint in close proximity to the exiting left L5 nerve at the lateral aspect of the foramen though without frank nerve compression. IMPRESSION: 1. Severe left L5-S1 facet arthritis with associated joint effusion and edema. Severe left neural foraminal stenosis with potential left L5 nerve root impingement. 2. Mild-to-moderate spinal and lateral recess stenosis at L3-4 and L4-5. Electronically Signed: By: Logan Bores M.D. On: 05/01/2018 13:20       Assessment & Plan:   Problem List Items Addressed This Visit    Anemia    Follow cbc.        B12 deficiency    Continue B12 injections.        Diabetes mellitus ([place])    Low carb diet and exercise.  On metformin.  Follow met b and a1c.        Relevant Orders   Hemoglobin P5F   Basic metabolic panel   Microalbumin / creatinine urine ratio   Hypercholesterolemia    On lovastatin.  Low cholesterol diet and exercise.  Follow lipid panel and liver function tests.        Relevant Medications   amLODipine (NORVASC) 5 MG tablet   Other Relevant Orders   Hepatic function panel   Lipid panel   HYPERTENSION, BENIGN    On losartan and amlodipine 2.32m q day.  Blood pressure elevated.  Increase amlodipine to 533mq day.  Follow pressures.  Follow metabolic panel.        Relevant Medications   amLODipine (NORVASC) 5 MG tablet   Other Relevant Orders   TSH   My[place]henia gravis ([place]   On cellcept.  Followed by neurology.  Stable.           SCEinar PheasantMD

## 2018-06-11 DIAGNOSIS — M62830 Muscle spasm of back: Secondary | ICD-10-CM | POA: Diagnosis not present

## 2018-06-11 DIAGNOSIS — M9901 Segmental and somatic dysfunction of cervical region: Secondary | ICD-10-CM | POA: Diagnosis not present

## 2018-06-11 DIAGNOSIS — M9902 Segmental and somatic dysfunction of thoracic region: Secondary | ICD-10-CM | POA: Diagnosis not present

## 2018-06-11 DIAGNOSIS — M5033 Other cervical disc degeneration, cervicothoracic region: Secondary | ICD-10-CM | POA: Diagnosis not present

## 2018-06-25 ENCOUNTER — Ambulatory Visit: Payer: Medicare Other

## 2018-06-25 DIAGNOSIS — M48061 Spinal stenosis, lumbar region without neurogenic claudication: Secondary | ICD-10-CM | POA: Diagnosis not present

## 2018-06-25 DIAGNOSIS — M5136 Other intervertebral disc degeneration, lumbar region: Secondary | ICD-10-CM | POA: Diagnosis not present

## 2018-06-25 DIAGNOSIS — M5416 Radiculopathy, lumbar region: Secondary | ICD-10-CM | POA: Diagnosis not present

## 2018-06-26 ENCOUNTER — Ambulatory Visit (INDEPENDENT_AMBULATORY_CARE_PROVIDER_SITE_OTHER): Payer: Medicare Other

## 2018-06-26 DIAGNOSIS — E538 Deficiency of other specified B group vitamins: Secondary | ICD-10-CM

## 2018-06-26 MED ORDER — CYANOCOBALAMIN 1000 MCG/ML IJ SOLN
1000.0000 ug | Freq: Once | INTRAMUSCULAR | Status: AC
  Administered 2018-06-26: 1000 ug via INTRAMUSCULAR

## 2018-06-26 NOTE — Progress Notes (Signed)
Patient presented for B 12 injection to left deltoid, patient voiced no concerns nor showed any signs of distress during injection. 

## 2018-07-02 DIAGNOSIS — M5033 Other cervical disc degeneration, cervicothoracic region: Secondary | ICD-10-CM | POA: Diagnosis not present

## 2018-07-02 DIAGNOSIS — M9902 Segmental and somatic dysfunction of thoracic region: Secondary | ICD-10-CM | POA: Diagnosis not present

## 2018-07-02 DIAGNOSIS — M62830 Muscle spasm of back: Secondary | ICD-10-CM | POA: Diagnosis not present

## 2018-07-02 DIAGNOSIS — M9901 Segmental and somatic dysfunction of cervical region: Secondary | ICD-10-CM | POA: Diagnosis not present

## 2018-07-10 ENCOUNTER — Ambulatory Visit (INDEPENDENT_AMBULATORY_CARE_PROVIDER_SITE_OTHER): Payer: Medicare Other | Admitting: Cardiovascular Disease

## 2018-07-10 ENCOUNTER — Encounter: Payer: Self-pay | Admitting: Cardiovascular Disease

## 2018-07-10 VITALS — BP 142/62 | HR 68 | Ht 71.0 in | Wt 232.8 lb

## 2018-07-10 DIAGNOSIS — I10 Essential (primary) hypertension: Secondary | ICD-10-CM | POA: Diagnosis not present

## 2018-07-10 DIAGNOSIS — R002 Palpitations: Secondary | ICD-10-CM

## 2018-07-10 NOTE — Patient Instructions (Signed)

## 2018-07-10 NOTE — Progress Notes (Signed)
Cardiology Office Note  Date:  07/10/2018   ID:  OBI [name], DOB 10-Aug-1926, MRN [id number]  PCP:  Einar Pheasant, MD   Chief Complaint  Patient presents with  . other    12 month f/u no complaints today.  Meds reviewed verbally with pt.    HPI:  [name] is a 84 yo male with a h/o  Myasthenia Gravis HTN,  diabetes, hemoglobin A1c 8.9 obesity ,  hyperlipidemia,  pernicious anemia, palpitations   hyponatremia Presenting for routine followup of his hypertension  previous diagnosis of Myasthenia Gravis severe muscle weakness, arms, occular, facial had 5 treatments over 2 weeks, QOD On cellcept since Jan 2018 On today's visit reports that when he wakes up 1 of his eyelids is slow to get out of the way Through the course of the morning usually resolves and goes back to normal  High weight Sedentary, no regular exercise Back problem,did injection, pain better Diabetes numbers are running high  Wife died 11-05-17, severe Parkinson's Still adjusting  Trace lower extr edema Not bothered by it Denies significant shortness of breath Does not like wearing compression hose, instead wears diabetic socks Lots of sitting  Lab work reviewed  total chol 135, ldl 67 HBA1C 8.9 Sodium 132  EKG on today's visit shows normal sinus rhythm with rate 68 bpm, no significant ST or T-wave changes, left anterior fascicular block   PMH:   has a past medical history of Allergic rhinitis, BPH (benign prostatic hypertrophy), Degenerative arthritis, Diabetes mellitus ([place]), GERD (gastroesophageal reflux disease), HTN (hypertension), Hypercholesterolemia, HYPERTENSION, BENIGN (08/15/2010), Iron deficiency anemia, Skin cancer, TIA (transient ischemic attack) (09/13/2015), and Urinary outflow obstruction.  PSH:    Past Surgical History:  Procedure Laterality Date  . PILONIDAL CYST EXCISION  1951   removal  . [place]  . SKIN CANCER EXCISION     multiple  .  SQUAMOUS CELL CARCINOMA EXCISION     behind head  . TONSILLECTOMY AND ADENOIDECTOMY  1938    Current Outpatient Medications  Medication Sig Dispense Refill  . ACCU-CHEK FASTCLIX LANCETS MISC Use daily as directed. DX E11.9 102 each 12  . amLODipine (NORVASC) 5 MG tablet Take 1 tablet (5 mg total) by mouth daily. 30 tablet 3  . aspirin (ASPIR-LOW) 81 MG EC tablet Take 81 mg by mouth daily.      . calcium-vitamin D (OSCAL 500/200 D-3) 500-200 MG-UNIT per tablet Take 1 tablet by mouth 2 (two) times daily.     . cyanocobalamin (,VITAMIN B-12,) 1000 MCG/ML injection Inject 1,000 mcg into the muscle every 30 (thirty) days.    Marland Kitchen docusate sodium (COLACE) 100 MG capsule Take by mouth.    . finasteride (PROSCAR) 5 MG tablet Take 1 tablet (5 mg total) by mouth daily. 30 tablet 3  . glucose blood (ACCU-CHEK GUIDE) test strip Use twice daily to check blood sugars Dx E11.9 100 each 12  . HYDROcodone-acetaminophen (NORCO/VICODIN) 5-325 MG tablet   0  . losartan (COZAAR) 100 MG tablet Take 1 tablet (100 mg total) by mouth daily. 90 tablet 1  . lovastatin (MEVACOR) 20 MG tablet TAKE ONE TABLET AT BEDTIME 90 tablet 0  . metFORMIN (GLUCOPHAGE XR) 500 MG 24 hr tablet Take 1 tablet (500 mg total) by mouth daily with breakfast. 30 tablet 2  . mycophenolate (CELLCEPT) 500 MG tablet Take 2 tablets (1,000 mg total) by mouth 2 (two) times daily.    . tamsulosin (FLOMAX) 0.4 MG CAPS capsule Take  1 capsule (0.4 mg total) by mouth 2 (two) times daily. 60 capsule 11   No current facility-administered medications for this visit.      Allergies:   Patient has no known allergies.   Social History:  The patient  reports that he has never smoked. He has never used smokeless tobacco. He reports that he does not drink alcohol or use drugs.   Family History:   family history includes Alzheimer's disease in his mother; CVA in his brother; Heart disease in his father.    Review of Systems: Review of Systems   Constitutional: Negative.   Respiratory: Negative.   Cardiovascular: Positive for leg swelling.  Gastrointestinal: Negative.   Musculoskeletal: Negative.   Neurological: Negative.   Psychiatric/Behavioral: Negative.   All other systems reviewed and are negative.    PHYSICAL EXAM: VS:  BP (!) 142/62 (BP Location: Left Arm, Patient Position: Sitting, Cuff Size: Large)   Pulse 68   Ht 5\' 11"  (1.803 m)   Wt 232 lb 12 oz (105.6 kg)   BMI 32.46 kg/m  , BMI Body mass index is 32.46 kg/m. Constitutional:  oriented to person, place, and time. No distress. Obese HENT:  Head: Normocephalic and atraumatic.  Eyes:  no discharge. No scleral icterus.  Neck: Normal range of motion. Neck supple. No JVD present.  Cardiovascular: Normal rate, regular rhythm, normal heart sounds and intact distal pulses. Exam reveals no gallop and no friction rub.  Trace minimally pitting lower extremity edema No murmur heard. Pulmonary/Chest: Effort normal and breath sounds normal. No stridor. No respiratory distress.  no wheezes.  no rales.  no tenderness.  Abdominal: Soft.  no distension.  no tenderness.  Musculoskeletal: Normal range of motion.  no  tenderness or deformity.  Neurological:  normal muscle tone. Coordination normal. No atrophy Skin: Skin is warm and dry. No rash noted. not diaphoretic.  Psychiatric:  normal mood and affect. behavior is normal. Thought content normal.     Recent Labs: 04/11/2018: ALT 24; BUN 16; Creatinine, Ser 0.79; Sodium 133 05/21/2018: Hemoglobin 13.2; Platelets 310.0; Potassium 5.0    Lipid Panel Lab Results  Component Value Date   CHOL 135 04/11/2018   HDL 39.00 (L) 04/11/2018   LDLCALC 67 04/11/2018   TRIG 149.0 04/11/2018      Wt Readings from Last 3 Encounters:  07/10/18 232 lb 12 oz (105.6 kg)  06/09/18 234 lb 8 oz (106.4 kg)  04/16/18 235 lb 3.2 oz (106.7 kg)      ASSESSMENT AND PLAN:   Hypercholesterolemia Cholesterol is at goal on the current  lipid regimen. No changes to the medications were made. Stable  HYPERTENSION, BENIGN Blood pressure is well controlled on today's visit. No changes made to the medications. Stable  Type 2 diabetes mellitus without complication, without long-term current use of insulin (HCC) Recommended low carbohydrates, walking program Weight continues to run high and is not active likely eating the wrong foods Started on metformin, HGA1c 8.9  Obesity (BMI 30.0-34.9 Weight is high , recommended low carbohydrate diet Recommended walking program  Myasthenia gravis ([place]) On CellCept feels symptoms are stable Some problems with eyelid in the morning  Hyponatremia Stable, 133 Encouraged slightly increased salt intake  Leg swelling  dependent edema, would benefit from compression hose   Total encounter time more than 25 minutes  Greater than 50% was spent in counseling and coordination of care with the patient   Disposition:   F/U  12 months   Orders Placed This  Encounter  Procedures  . EKG 12-Lead     Signed, Esmond Plants, M.D., Ph.D. 07/10/2018  [place], [place]

## 2018-07-21 ENCOUNTER — Other Ambulatory Visit: Payer: Self-pay | Admitting: Internal Medicine

## 2018-07-22 ENCOUNTER — Telehealth: Payer: Self-pay

## 2018-07-22 NOTE — Telephone Encounter (Signed)
Copied from [place] [id number]. Topic: General - Other >> Jul 22, 2018 11:31 AM Yvette Rack wrote: Reason for CRM: pt calling to speak with Dr Nicki Reaper assistant he has questions lancents and test strips

## 2018-07-24 ENCOUNTER — Other Ambulatory Visit: Payer: Self-pay

## 2018-07-24 MED ORDER — ACCU-CHEK FASTCLIX LANCETS MISC: 12 refills | Status: DC

## 2018-07-24 NOTE — Telephone Encounter (Signed)
Sent in new rx for lancets and test strips

## 2018-07-24 NOTE — Telephone Encounter (Signed)
Attempted to reach patient. Phone was busy 

## 2018-07-29 DIAGNOSIS — M9902 Segmental and somatic dysfunction of thoracic region: Secondary | ICD-10-CM | POA: Diagnosis not present

## 2018-07-29 DIAGNOSIS — M9901 Segmental and somatic dysfunction of cervical region: Secondary | ICD-10-CM | POA: Diagnosis not present

## 2018-07-29 DIAGNOSIS — M5033 Other cervical disc degeneration, cervicothoracic region: Secondary | ICD-10-CM | POA: Diagnosis not present

## 2018-07-29 DIAGNOSIS — M62830 Muscle spasm of back: Secondary | ICD-10-CM | POA: Diagnosis not present

## 2018-07-30 ENCOUNTER — Ambulatory Visit (INDEPENDENT_AMBULATORY_CARE_PROVIDER_SITE_OTHER): Payer: Medicare Other | Admitting: *Deleted

## 2018-07-30 DIAGNOSIS — E538 Deficiency of other specified B group vitamins: Secondary | ICD-10-CM

## 2018-07-30 MED ORDER — CYANOCOBALAMIN 1000 MCG/ML IJ SOLN
1000.0000 ug | Freq: Once | INTRAMUSCULAR | Status: AC
  Administered 2018-07-30: 1000 ug via INTRAMUSCULAR

## 2018-07-30 NOTE — Progress Notes (Signed)
Patient presented for B 12 injection to right deltoid, patient voiced no concerns nor showed any signs of distress during injection. 

## 2018-08-07 ENCOUNTER — Ambulatory Visit (INDEPENDENT_AMBULATORY_CARE_PROVIDER_SITE_OTHER): Payer: Medicare Other | Admitting: Podiatry

## 2018-08-07 ENCOUNTER — Encounter: Payer: Self-pay | Admitting: Podiatry

## 2018-08-07 DIAGNOSIS — E119 Type 2 diabetes mellitus without complications: Secondary | ICD-10-CM | POA: Diagnosis not present

## 2018-08-07 DIAGNOSIS — B351 Tinea unguium: Secondary | ICD-10-CM

## 2018-08-07 DIAGNOSIS — M79675 Pain in left toe(s): Secondary | ICD-10-CM

## 2018-08-07 DIAGNOSIS — M79674 Pain in right toe(s): Secondary | ICD-10-CM

## 2018-08-07 DIAGNOSIS — Q828 Other specified congenital malformations of skin: Secondary | ICD-10-CM

## 2018-08-07 LAB — HM DIABETES FOOT EXAM

## 2018-08-07 NOTE — Progress Notes (Signed)
Complaint:  Visit Type: Patient presents to the office for  preventative foot care services. Complaint: Patient states" my nails have grown long and thick and become painful to walk and wear shoes" Patient has been diagnosed with DM and does not take medication for diabetes.. The patient presents for preventative foot care services. No changes to ROS.  Patient has developed a callus under the outside ball of left foot.  Podiatric Exam: Vascular: dorsalis pedis and posterior tibial pulses are palpable bilateral. Capillary return is immediate. Temperature gradient is WNL. Skin turgor WNL  Sensorium: Normal Semmes Weinstein monofilament test. Normal tactile sensation bilaterally. Nail Exam: Pt has thick disfigured discolored nails with subungual debris noted bilateral entire nail hallux through fifth toenails Ulcer Exam: There is no evidence of ulcer or pre-ulcerative changes or infection. Orthopedic Exam: Muscle tone and strength are WNL. No limitations in general ROM. No crepitus or effusions noted. Foot type and digits show no abnormalities. Bony prominences are unremarkable. Skin:  Porokeratosis sub 5th met left foot.. No infection or ulcers  Diagnosis:  Onychomycosis, , Pain in right toe, pain in left toes  Treatment & Plan Procedures and Treatment: Consent by patient was obtained for treatment procedures.   Debridement of mycotic and hypertrophic toenails, 1 through 5 bilateral and clearing of subungual debris. No ulceration, no infection noted. Debride callys left foot. Return Visit-Office Procedure: Patient instructed to return to the office for a follow up visit 3 months for continued evaluation and treatment.    Gardiner Barefoot DPM

## 2018-08-12 ENCOUNTER — Other Ambulatory Visit (INDEPENDENT_AMBULATORY_CARE_PROVIDER_SITE_OTHER): Payer: Medicare Other

## 2018-08-12 DIAGNOSIS — E7800 Pure hypercholesterolemia, unspecified: Secondary | ICD-10-CM

## 2018-08-12 DIAGNOSIS — E119 Type 2 diabetes mellitus without complications: Secondary | ICD-10-CM | POA: Diagnosis not present

## 2018-08-12 DIAGNOSIS — I10 Essential (primary) hypertension: Secondary | ICD-10-CM | POA: Diagnosis not present

## 2018-08-12 LAB — LIPID PANEL
CHOL/HDL RATIO: 3
Cholesterol: 131 mg/dL (ref 0–200)
HDL: 39.3 mg/dL (ref >39.00)
LDL CALC: 58 mg/dL (ref 0–99)
NonHDL: 92.1
TRIGLYCERIDES: 170 mg/dL — AB (ref 0.0–149.0)
VLDL: 34 mg/dL (ref 0.0–40.0)

## 2018-08-12 LAB — HEPATIC FUNCTION PANEL
ALBUMIN: 4 g/dL (ref 3.5–5.2)
ALT: 24 U/L (ref 0–53)
AST: 19 U/L (ref 0–37)
Alkaline Phosphatase: 36 U/L — ABNORMAL LOW (ref 39–117)
Bilirubin, Direct: 0.2 mg/dL (ref 0.0–0.3)
TOTAL PROTEIN: 7.1 g/dL (ref 6.0–8.3)
Total Bilirubin: 0.9 mg/dL (ref 0.2–1.2)

## 2018-08-12 LAB — BASIC METABOLIC PANEL
BUN: 14 mg/dL (ref 6–23)
CHLORIDE: 102 meq/L (ref 96–112)
CO2: 27 mEq/L (ref 19–32)
CREATININE: 0.74 mg/dL (ref 0.40–1.50)
Calcium: 9.6 mg/dL (ref 8.4–10.5)
GFR: 105.64 mL/min (ref >60.00)
Glucose, Bld: 173 mg/dL — ABNORMAL HIGH (ref 70–99)
POTASSIUM: 4.8 meq/L (ref 3.5–5.1)
SODIUM: 137 meq/L (ref 135–145)

## 2018-08-12 LAB — HEMOGLOBIN A1C: Hgb A1c MFr Bld: 8.1 % — ABNORMAL HIGH (ref 4.6–6.5)

## 2018-08-12 LAB — MICROALBUMIN / CREATININE URINE RATIO
CREATININE, U: 67.3 mg/dL
MICROALB/CREAT RATIO: 6.1 mg/g (ref 0.0–30.0)
Microalb, Ur: 4.1 mg/dL — ABNORMAL HIGH (ref 0.0–1.9)

## 2018-08-12 LAB — TSH: TSH: 2.52 u[IU]/mL (ref 0.35–4.50)

## 2018-08-13 ENCOUNTER — Encounter: Payer: Self-pay | Admitting: Internal Medicine

## 2018-08-13 ENCOUNTER — Ambulatory Visit (INDEPENDENT_AMBULATORY_CARE_PROVIDER_SITE_OTHER): Payer: Medicare Other | Admitting: Internal Medicine

## 2018-08-13 DIAGNOSIS — Z23 Encounter for immunization: Secondary | ICD-10-CM | POA: Diagnosis not present

## 2018-08-13 DIAGNOSIS — E538 Deficiency of other specified B group vitamins: Secondary | ICD-10-CM | POA: Diagnosis not present

## 2018-08-13 DIAGNOSIS — I10 Essential (primary) hypertension: Secondary | ICD-10-CM | POA: Diagnosis not present

## 2018-08-13 DIAGNOSIS — M545 Low back pain, unspecified: Secondary | ICD-10-CM

## 2018-08-13 DIAGNOSIS — E119 Type 2 diabetes mellitus without complications: Secondary | ICD-10-CM

## 2018-08-13 DIAGNOSIS — D649 Anemia, unspecified: Secondary | ICD-10-CM

## 2018-08-13 DIAGNOSIS — Z6834 Body mass index (BMI) 34.0-34.9, adult: Secondary | ICD-10-CM | POA: Diagnosis not present

## 2018-08-13 DIAGNOSIS — G7000 Myasthenia gravis without (acute) exacerbation: Secondary | ICD-10-CM

## 2018-08-13 DIAGNOSIS — G8929 Other chronic pain: Secondary | ICD-10-CM | POA: Diagnosis not present

## 2018-08-13 DIAGNOSIS — Z0000 Encounter for general adult medical examination without abnormal findings: Secondary | ICD-10-CM

## 2018-08-13 DIAGNOSIS — E7800 Pure hypercholesterolemia, unspecified: Secondary | ICD-10-CM

## 2018-08-13 NOTE — Progress Notes (Signed)
Patient ID: [name], male   DOB: Jan 03, 1926, 84 y.o.   MRN: [id number]   Subjective:    Patient ID: [name], male    DOB: 1926/05/12, 84 y.o.   MRN: [id number]  HPI  Patient with past history of diabetes, hypercholesterolemia and hypertension.  He comes in today to follow up on these issues as well as for a complete physical exam.  He reports that the pain down his legs - better.  Going to the gym.  No significant pain now.  No chest pain.  No sob.  No acid reflux.  No abdominal pain.  Bowels moving.  States sugars doing better.     Past Medical History:  Diagnosis Date  . Allergic rhinitis   . BPH (benign prostatic hypertrophy)   . Degenerative arthritis   . Diabetes mellitus ([place])   . GERD (gastroesophageal reflux disease)   . HTN (hypertension)   . Hypercholesterolemia   . HYPERTENSION, BENIGN 08/15/2010   Qualifier: Diagnosis of  By: Rockey Situ MD, Tim    . Iron deficiency anemia   . Skin cancer   . TIA (transient ischemic attack) 09/13/2015  . Urinary outflow obstruction    mild   Past Surgical History:  Procedure Laterality Date  . PILONIDAL CYST EXCISION  1951   removal  . [place]  . SKIN CANCER EXCISION     multiple  . SQUAMOUS CELL CARCINOMA EXCISION     behind head  . TONSILLECTOMY AND ADENOIDECTOMY  1938   Family History  Problem Relation Age of Onset  . Heart disease Father        heart atack  . Alzheimer's disease Mother   . CVA Brother    Social History   Socioeconomic History  . Marital status: Married    Spouse name: Not on file  . Number of children: Not on file  . Years of education: Not on file  . Highest education level: Not on file  Occupational History  . Not on file  Social Needs  . Financial resource strain: Not on file  . Food insecurity:    Worry: Not on file    Inability: Not on file  . Transportation needs:    Medical: Not on file    Non-medical: Not on file  Tobacco Use  . Smoking status:  Never Smoker  . Smokeless tobacco: Never Used  Substance and Sexual Activity  . Alcohol use: No    Alcohol/week: 0.0 standard drinks  . Drug use: No  . Sexual activity: Not on file  Lifestyle  . Physical activity:    Days per week: Not on file    Minutes per session: Not on file  . Stress: Not on file  Relationships  . Social connections:    Talks on phone: Not on file    Gets together: Not on file    Attends religious service: Not on file    Active member of club or organization: Not on file    Attends meetings of clubs or organizations: Not on file    Relationship status: Not on file  Other Topics Concern  . Not on file  Social History Narrative   Retired, married. Walks everyday          Outpatient Encounter Medications as of 08/13/2018  Medication Sig  . ACCU-CHEK FASTCLIX LANCETS MISC Use daily as directed to check sugars twice daily. DX E11.9  . amLODipine (NORVASC) 5 MG tablet Take 1 tablet (  5 mg total) by mouth daily.  Marland Kitchen aspirin (ASPIR-LOW) 81 MG EC tablet Take 81 mg by mouth daily.    . calcium-vitamin D (OSCAL 500/200 D-3) 500-200 MG-UNIT per tablet Take 1 tablet by mouth 2 (two) times daily.   . cyanocobalamin (,VITAMIN B-12,) 1000 MCG/ML injection Inject 1,000 mcg into the muscle every 30 (thirty) days.  Marland Kitchen docusate sodium (COLACE) 100 MG capsule Take by mouth.  . finasteride (PROSCAR) 5 MG tablet Take 1 tablet (5 mg total) by mouth daily.  Marland Kitchen glucose blood (ACCU-CHEK GUIDE) test strip Use twice daily to check blood sugars Dx E11.9  . HYDROcodone-acetaminophen (NORCO/VICODIN) 5-325 MG tablet   . losartan (COZAAR) 100 MG tablet Take 1 tablet (100 mg total) by mouth daily.  Marland Kitchen lovastatin (MEVACOR) 20 MG tablet TAKE ONE TABLET AT BEDTIME  . metFORMIN (GLUCOPHAGE-XR) 500 MG 24 hr tablet Take 1 tablet (500 mg total) by mouth daily with breakfast.  . mycophenolate (CELLCEPT) 500 MG tablet Take 2 tablets (1,000 mg total) by mouth 2 (two) times daily.  . tamsulosin (FLOMAX)  0.4 MG CAPS capsule Take 1 capsule (0.4 mg total) by mouth 2 (two) times daily.   No facility-administered encounter medications on file as of 08/13/2018.     Review of Systems  Constitutional: Negative for appetite change and unexpected weight change.  HENT: Negative for congestion and sinus pressure.   Eyes: Negative for pain and visual disturbance.  Respiratory: Negative for cough, chest tightness and shortness of breath.   Cardiovascular: Negative for chest pain, palpitations and leg swelling.  Gastrointestinal: Negative for abdominal pain, diarrhea, nausea and vomiting.  Genitourinary: Negative for difficulty urinating and frequency.  Musculoskeletal: Negative for joint swelling and myalgias.  Skin: Negative for color change and rash.  Neurological: Negative for dizziness, light-headedness and headaches.  Hematological: Negative for adenopathy. Does not bruise/bleed easily.  Psychiatric/Behavioral: Negative for agitation and dysphoric mood.       Objective:     Blood pressure rechecked by me:  138/62  Physical Exam  Constitutional: He is oriented to person, place, and time. He appears well-developed and well-nourished. No distress.  HENT:  Head: Normocephalic and atraumatic.  Nose: Nose normal.  Mouth/Throat: Oropharynx is clear and moist. No oropharyngeal exudate.  Eyes: Conjunctivae are normal. Right eye exhibits no discharge. Left eye exhibits no discharge.  Neck: Neck supple. No thyromegaly present.  Cardiovascular: Normal rate and regular rhythm.  Pulmonary/Chest: Breath sounds normal. No respiratory distress. He has no wheezes.  Abdominal: Soft. Bowel sounds are normal. There is no tenderness.  Genitourinary:  Genitourinary Comments: Not performed.    Musculoskeletal: He exhibits no edema or tenderness.  Lymphadenopathy:    He has no cervical adenopathy.  Neurological: He is alert and oriented to person, place, and time.  Skin: No rash noted. No erythema.    Psychiatric: He has a normal mood and affect. His behavior is normal.    BP 130/60   Pulse 73   Temp 97.6 F (36.4 C) (Oral)   Resp 17   Ht '5\' 9"'  (1.753 m)   Wt 230 lb 8 oz (104.6 kg)   SpO2 98%   BMI 34.04 kg/m  Wt Readings from Last 3 Encounters:  08/13/18 230 lb 8 oz (104.6 kg)  07/10/18 232 lb 12 oz (105.6 kg)  06/09/18 234 lb 8 oz (106.4 kg)     Lab Results  Component Value Date   WBC 6.7 05/21/2018   HGB 13.2 05/21/2018   HCT 38.6 (  L) 05/21/2018   PLT 310.0 05/21/2018   GLUCOSE 173 (H) 08/12/2018   CHOL 131 08/12/2018   TRIG 170.0 (H) 08/12/2018   HDL 39.30 08/12/2018   LDLCALC 58 08/12/2018   ALT 24 08/12/2018   AST 19 08/12/2018   NA 137 08/12/2018   K 4.8 08/12/2018   CL 102 08/12/2018   CREATININE 0.74 08/12/2018   BUN 14 08/12/2018   CO2 27 08/12/2018   TSH 2.52 08/12/2018   PSA 0.02 (L) 04/16/2018   HGBA1C 8.1 (H) 08/12/2018   MICROALBUR 4.1 (H) 08/12/2018    Mr Lumbar Spine Wo Contrast  Addendum Date: 05/01/2018   ADDENDUM REPORT: 05/01/2018 17:12 ADDENDUM: Chronic T12, L2, L3, and L5 compression fractures. Electronically Signed   By: Logan Bores M.D.   On: 05/01/2018 17:12   Result Date: 05/01/2018 CLINICAL DATA:  Lumbar radiculopathy. Low back and left leg pain for couple of months. EXAM: MRI LUMBAR SPINE WITHOUT CONTRAST TECHNIQUE: Multiplanar, multisequence MR imaging of the lumbar spine was performed. No intravenous contrast was administered. COMPARISON:  None. FINDINGS: Segmentation:  Standard. Alignment: Mild lumbar levoscoliosis. 3 mm anterolisthesis of L3 on L4. Vertebrae: T12, L2, L3, and L5 compression fractures with mild-to-moderate vertebral body height loss and no significant marrow edema. 3 mm retropulsion of the posterosuperior T12 vertebral body. Mild periarticular marrow and soft tissue edema associated with left L5-S1 facet arthritis. Conus medullaris and cauda equina: Conus extends to the lower L1 level. Conus and cauda equina  appear normal. Paraspinal and other soft tissues: Mild left lower lumbar posterior paraspinal muscle, possibly strain. Disc levels: Disc desiccation throughout the lower thoracic and lumbar spine. Mild disc space narrowing at L2-3 and L4-5. T11-12: Mild T12 superior endplate retropulsion, mild disc bulging, and mild facet arthrosis result in borderline to mild spinal stenosis without neural foraminal stenosis. T12-L1: Mild disc bulging and moderate facet hypertrophy without stenosis. L1-2: Mild circumferential disc bulging and moderate facet hypertrophy result in minimal right neural foraminal narrowing without spinal stenosis. L2-3: Circumferential disc bulging, a broad right subarticular to foraminal disc protrusion, and moderate facet hypertrophy result in minimal right lateral recess narrowing without spinal or neural foraminal stenosis. L3-4: Anterolisthesis with slight bulging of uncovered disc, mildly prominent dorsal epidural fat, and severe facet hypertrophy result in mild-to-moderate spinal stenosis, mild bilateral lateral recess stenosis, and mild bilateral neural foraminal stenosis. There is likely facet ankylosis bilaterally. L4-5: Disc bulging, mild ligamentum flavum hypertrophy, and moderate left greater than right facet hypertrophy result in mild spinal stenosis, mild-to-moderate left greater than right lateral recess stenosis, and minimal bilateral neural foraminal stenosis. L5-S1: Disc bulging and severe left greater than right facet arthrosis result in mild left lateral recess stenosis and mild right and severe left neural foraminal stenosis with potential left L5 nerve root impingement. There is a left facet joint effusion, and a 6 x 3 mm synovial cyst is present anterior to the left facet joint in close proximity to the exiting left L5 nerve at the lateral aspect of the foramen though without frank nerve compression. IMPRESSION: 1. Severe left L5-S1 facet arthritis with associated joint effusion  and edema. Severe left neural foraminal stenosis with potential left L5 nerve root impingement. 2. Mild-to-moderate spinal and lateral recess stenosis at L3-4 and L4-5. Electronically Signed: By: Logan Bores M.D. On: 05/01/2018 13:20       Assessment & Plan:   Problem List Items Addressed This Visit    Anemia    Follow cbc.  Relevant Orders   CBC with Differential/Platelet   B12 deficiency    Continue b12 injections.       BMI 34.0-34.9,adult    Discussed diet and exercise.  Follow.        Chronic right-sided low back pain without sciatica    Better.  Seeing Dr Sharlet Salina.        Diabetes mellitus ([place])    Low carb diet and exercise.  Follow met b and a1c.        Relevant Orders   Hemoglobin R9X   Basic metabolic panel   Health care maintenance    Physical today 08/13/18.        Hypercholesterolemia    On lovastatin.  Low cholesterol diet and exercise.  Follow lipid panel and liver function tests.        Relevant Orders   Hepatic function panel   Lipid panel   HYPERTENSION, BENIGN    Blood pressure under good control.  Continue same medication regimen.  Follow pressures.  Follow metabolic panel.        Myasthenia gravis ([place])    On cellcept.  Followed by neurology.  Stable.         Other Visit Diagnoses    Encounter for immunization       Relevant Orders   Flu vaccine HIGH DOSE PF (Completed)       Einar Pheasant, MD

## 2018-08-15 ENCOUNTER — Ambulatory Visit: Payer: Medicare Other

## 2018-08-16 ENCOUNTER — Encounter: Payer: Self-pay | Admitting: Internal Medicine

## 2018-08-16 NOTE — Assessment & Plan Note (Signed)
On cellcept.  Followed by neurology.  Stable.

## 2018-08-16 NOTE — Assessment & Plan Note (Signed)
Blood pressure under good control.  Continue same medication regimen.  Follow pressures.  Follow metabolic panel.   

## 2018-08-16 NOTE — Assessment & Plan Note (Signed)
Continue b12 injections.  

## 2018-08-16 NOTE — Assessment & Plan Note (Signed)
Physical today 08/13/18.

## 2018-08-16 NOTE — Assessment & Plan Note (Signed)
Discussed diet and exercise.  Follow.  

## 2018-08-16 NOTE — Assessment & Plan Note (Signed)
Better.  Seeing Dr Sharlet Salina.

## 2018-08-16 NOTE — Assessment & Plan Note (Signed)
Low carb diet and exercise.  Follow met b and a1c.   

## 2018-08-16 NOTE — Assessment & Plan Note (Signed)
On lovastatin.  Low cholesterol diet and exercise.  Follow lipid panel and liver function tests.   

## 2018-08-16 NOTE — Assessment & Plan Note (Signed)
Follow cbc.  

## 2018-08-19 ENCOUNTER — Encounter: Payer: Self-pay | Admitting: Urology

## 2018-08-19 ENCOUNTER — Ambulatory Visit (INDEPENDENT_AMBULATORY_CARE_PROVIDER_SITE_OTHER): Payer: Medicare Other | Admitting: Urology

## 2018-08-19 VITALS — BP 142/74 | HR 73 | Resp 16 | Ht 69.0 in | Wt 230.6 lb

## 2018-08-19 DIAGNOSIS — N401 Enlarged prostate with lower urinary tract symptoms: Secondary | ICD-10-CM | POA: Diagnosis not present

## 2018-08-19 LAB — BLADDER SCAN AMB NON-IMAGING: SCAN RESULT: 13

## 2018-08-19 MED ORDER — TAMSULOSIN HCL 0.4 MG PO CAPS: 0.4000 mg | ORAL_CAPSULE | Freq: Every day | ORAL | 3 refills | Status: DC

## 2018-08-19 MED ORDER — FINASTERIDE 5 MG PO TABS: 5.0000 mg | ORAL_TABLET | Freq: Every day | ORAL | 3 refills | Status: DC

## 2018-08-19 NOTE — Progress Notes (Signed)
   08/19/2018 10:20 AM   [name] 25-Feb-1926 [id number]  Reason for visit: Follow up BPH/LUTS  HPI: I had the pleasure of meeting Mr. Osmon in urology clinic today to discuss his urinary symptoms.  He was previously followed by Dr. Junious Silk and Dr. Pilar Jarvis.  He is an 84 year old male with a long history of mild to moderate urinary symptoms.  He is on maximal medical therapy with Flomax and finasteride.  He is taking the Flomax twice daily.  IPSS score in clinic today is 22 with quality of life due to urinary symptoms mostly satisfied.  PVR in clinic today is 13 cc.  He denies any gross hematuria.  His primary complaint is nocturia 2-3 times per night, as well as occasional urgency with intermittent weak stream.   ROS: Please see flowsheet from today's date for complete review of systems.  Physical Exam: BP (!) 142/74   Pulse 73   Resp 16   Ht 5\' 9"  (1.753 m)   Wt 230 lb 9.6 oz (104.6 kg)   BMI 34.05 kg/m    Constitutional:  Alert and oriented, No acute distress. Respiratory: Normal respiratory effort, no increased work of breathing. GI: Abdomen is soft, nontender, nondistended, no abdominal masses GU: No CVA tendernesss DRE: Deferred Skin: No rashes, bruises or suspicious lesions. Neurologic: Grossly intact, no focal deficits, moving all 4 extremities. Psychiatric: Normal mood and affect  Laboratory Data: None to review  Pertinent Imaging: None to review.  Assessment & Plan:   In summary, Mr. Beale is an 84 year old male with a long history of mild to moderate urinary symptoms well-controlled on maximal medical therapy with Flomax and finasteride.  We discussed behavioral strategies at length including minimizing coffee, tea, and soda in the diet, as well as timed and double voiding, especially prior to bedtime.  We also discussed minimizing fluids in the evening to decrease his nocturia. We changed his Flomax to just once daily to minimize his risk of  lightheadedness or falls.  Finally, we discussed other alternatives for overactive bladder type symptoms since he is emptying well, including Myrbetriq or anticholinergics.  I do not feel anticholinergics are a good option for him with his instability in older age.  He is not interested in trying another medication at this time, which is certainly reasonable  Follow-up in 1 year with [facility], re-discuss Myrbetriq if bothersome overactive symptoms  Billey Co, MD  [facility] [address] [place], [place] [address] [phone]

## 2018-08-26 DIAGNOSIS — M9901 Segmental and somatic dysfunction of cervical region: Secondary | ICD-10-CM | POA: Diagnosis not present

## 2018-08-26 DIAGNOSIS — M62830 Muscle spasm of back: Secondary | ICD-10-CM | POA: Diagnosis not present

## 2018-08-26 DIAGNOSIS — M5033 Other cervical disc degeneration, cervicothoracic region: Secondary | ICD-10-CM | POA: Diagnosis not present

## 2018-08-26 DIAGNOSIS — M9902 Segmental and somatic dysfunction of thoracic region: Secondary | ICD-10-CM | POA: Diagnosis not present

## 2018-09-03 ENCOUNTER — Ambulatory Visit (INDEPENDENT_AMBULATORY_CARE_PROVIDER_SITE_OTHER): Payer: Medicare Other

## 2018-09-03 DIAGNOSIS — H02054 Trichiasis without entropian left upper eyelid: Secondary | ICD-10-CM | POA: Diagnosis not present

## 2018-09-03 DIAGNOSIS — E119 Type 2 diabetes mellitus without complications: Secondary | ICD-10-CM | POA: Diagnosis not present

## 2018-09-03 DIAGNOSIS — E538 Deficiency of other specified B group vitamins: Secondary | ICD-10-CM

## 2018-09-03 LAB — HM DIABETES EYE EXAM

## 2018-09-03 MED ORDER — CYANOCOBALAMIN 1000 MCG/ML IJ SOLN
1000.0000 ug | Freq: Once | INTRAMUSCULAR | Status: AC
  Administered 2018-09-03: 1000 ug via INTRAMUSCULAR

## 2018-09-03 NOTE — Progress Notes (Addendum)
Pt came in for monthly b12. Given in L deltoid. Tolerated well with no complaints or concerns at this time.  Reviewed.  Dr Nicki Reaper

## 2018-09-19 ENCOUNTER — Other Ambulatory Visit: Payer: Self-pay | Admitting: Internal Medicine

## 2018-09-22 ENCOUNTER — Other Ambulatory Visit: Payer: Self-pay

## 2018-09-22 ENCOUNTER — Telehealth: Payer: Self-pay | Admitting: Internal Medicine

## 2018-09-22 NOTE — Telephone Encounter (Signed)
Pt would like to have all his diabetic supplies, only sent to [facility] on [address], [place]. That would his test stripes and needles only.   He will need these filled with in th next week. Please let him know when this is complete.

## 2018-09-23 ENCOUNTER — Other Ambulatory Visit: Payer: Self-pay

## 2018-09-23 DIAGNOSIS — M9901 Segmental and somatic dysfunction of cervical region: Secondary | ICD-10-CM | POA: Diagnosis not present

## 2018-09-23 DIAGNOSIS — M62830 Muscle spasm of back: Secondary | ICD-10-CM | POA: Diagnosis not present

## 2018-09-23 DIAGNOSIS — M9902 Segmental and somatic dysfunction of thoracic region: Secondary | ICD-10-CM | POA: Diagnosis not present

## 2018-09-23 DIAGNOSIS — M5033 Other cervical disc degeneration, cervicothoracic region: Secondary | ICD-10-CM | POA: Diagnosis not present

## 2018-09-23 MED ORDER — ACCU-CHEK FASTCLIX LANCETS MISC: 12 refills | Status: DC

## 2018-09-23 MED ORDER — GLUCOSE BLOOD VI STRP: ORAL_STRIP | 12 refills | Status: DC

## 2018-09-23 NOTE — Telephone Encounter (Signed)
Test strips and lancets sent to [facility] on [address] per pt request. Patient is aware.

## 2018-10-08 ENCOUNTER — Ambulatory Visit (INDEPENDENT_AMBULATORY_CARE_PROVIDER_SITE_OTHER): Payer: Medicare Other | Admitting: *Deleted

## 2018-10-08 DIAGNOSIS — E538 Deficiency of other specified B group vitamins: Secondary | ICD-10-CM | POA: Diagnosis not present

## 2018-10-08 MED ORDER — CYANOCOBALAMIN 1000 MCG/ML IJ SOLN
1000.0000 ug | Freq: Once | INTRAMUSCULAR | Status: AC
  Administered 2018-10-08: 1000 ug via INTRAMUSCULAR

## 2018-10-08 NOTE — Progress Notes (Signed)
Patient presented for B 12 injection to right deltoid, patient voiced no concerns nor showed any signs of distress during injection. 

## 2018-10-20 ENCOUNTER — Telehealth: Payer: Self-pay | Admitting: Internal Medicine

## 2018-10-20 ENCOUNTER — Encounter: Payer: Self-pay | Admitting: Internal Medicine

## 2018-10-20 NOTE — Telephone Encounter (Signed)
Patient pharmacy is going out of business and patient medications being transferred, the patients losartan is no longer available this medication will have to be replaced. Patient has enough medication of losartan for until PCP back in office. New pharmacy has been updated in chart.

## 2018-10-21 ENCOUNTER — Other Ambulatory Visit: Payer: Self-pay | Admitting: Internal Medicine

## 2018-10-21 DIAGNOSIS — M9901 Segmental and somatic dysfunction of cervical region: Secondary | ICD-10-CM | POA: Diagnosis not present

## 2018-10-21 DIAGNOSIS — M62830 Muscle spasm of back: Secondary | ICD-10-CM | POA: Diagnosis not present

## 2018-10-21 DIAGNOSIS — M9902 Segmental and somatic dysfunction of thoracic region: Secondary | ICD-10-CM | POA: Diagnosis not present

## 2018-10-21 DIAGNOSIS — M5033 Other cervical disc degeneration, cervicothoracic region: Secondary | ICD-10-CM | POA: Diagnosis not present

## 2018-10-21 MED ORDER — METFORMIN HCL ER 500 MG PO TB24: ORAL_TABLET | ORAL | 0 refills | Status: DC

## 2018-10-21 MED ORDER — OLMESARTAN MEDOXOMIL 40 MG PO TABS: 40.0000 mg | ORAL_TABLET | Freq: Every day | ORAL | 2 refills | Status: DC

## 2018-10-21 NOTE — Telephone Encounter (Signed)
Copied from [place] [id number]. Topic: Quick Communication - Rx Refill/Question >> Oct 21, 2018 11:16 AM Oneta Rack wrote:  Medication: metFORMIN (GLUCOPHAGE-XR) 500 MG 24 hr tablet (completely out)   Has the patient contacted their pharmacy?  Yes   (Agent: If yes, when and what did the pharmacy advise?) [id number] - [place], [place]  Preferred Pharmacy (with phone number or street name): new pharmacy advised patient to contact PCP office.   Agent: Please be advised that RX refills may take up to 3 business days. We ask that you follow-up with your pharmacy.

## 2018-10-21 NOTE — Telephone Encounter (Signed)
Pt came in to office due to phones being out of service to request a RX refill for Tamsulosin sent to [facility] on [address] from [facility].

## 2018-10-21 NOTE — Telephone Encounter (Signed)
I have sent in a prescription for benicar 40mg  per day.  This should be equivalent to losartan 100mg  q day.  Let us know if any problems.  He will stop losartan and start benicar daily.

## 2018-10-21 NOTE — Telephone Encounter (Signed)
Left message for patient to return my call to office [facility] nurse may advise.

## 2018-10-21 NOTE — Telephone Encounter (Signed)
Left message on home phone and on cell phone.

## 2018-10-22 NOTE — Telephone Encounter (Signed)
Pt given message form Dr Nicki Reaper dated 10/21/18. Pt verbalized understanding. Pt asked if Kerin Salen LPN could call pt and make sure that she needs to talk to him about "something else". He stated Juliann Pulse could call him on his cell phone.

## 2018-10-23 ENCOUNTER — Other Ambulatory Visit: Payer: Self-pay

## 2018-10-23 MED ORDER — OLMESARTAN MEDOXOMIL 40 MG PO TABS: 40.0000 mg | ORAL_TABLET | Freq: Every day | ORAL | 2 refills | Status: DC

## 2018-10-24 ENCOUNTER — Other Ambulatory Visit: Payer: Self-pay

## 2018-10-24 DIAGNOSIS — G7000 Myasthenia gravis without (acute) exacerbation: Secondary | ICD-10-CM | POA: Diagnosis not present

## 2018-10-24 DIAGNOSIS — N401 Enlarged prostate with lower urinary tract symptoms: Secondary | ICD-10-CM

## 2018-10-24 MED ORDER — FINASTERIDE 5 MG PO TABS: 5.0000 mg | ORAL_TABLET | Freq: Every day | ORAL | 3 refills | Status: DC

## 2018-10-24 MED ORDER — TAMSULOSIN HCL 0.4 MG PO CAPS: 0.4000 mg | ORAL_CAPSULE | Freq: Every day | ORAL | 3 refills | Status: DC

## 2018-11-03 DIAGNOSIS — H02059 Trichiasis without entropian unspecified eye, unspecified eyelid: Secondary | ICD-10-CM | POA: Diagnosis not present

## 2018-11-06 ENCOUNTER — Ambulatory Visit (INDEPENDENT_AMBULATORY_CARE_PROVIDER_SITE_OTHER): Payer: Medicare Other | Admitting: Podiatry

## 2018-11-06 ENCOUNTER — Encounter: Payer: Self-pay | Admitting: Podiatry

## 2018-11-06 DIAGNOSIS — B351 Tinea unguium: Secondary | ICD-10-CM | POA: Diagnosis not present

## 2018-11-06 DIAGNOSIS — M79674 Pain in right toe(s): Secondary | ICD-10-CM | POA: Diagnosis not present

## 2018-11-06 DIAGNOSIS — M79675 Pain in left toe(s): Secondary | ICD-10-CM | POA: Diagnosis not present

## 2018-11-06 DIAGNOSIS — E119 Type 2 diabetes mellitus without complications: Secondary | ICD-10-CM

## 2018-11-06 NOTE — Progress Notes (Signed)
Complaint:  Visit Type: Patient presents to the office for  preventative foot care services. Complaint: Patient states" my nails have grown long and thick and become painful to walk and wear shoes" Patient has been diagnosed with DM and does not take medication for diabetes.. The patient presents for preventative foot care services. No changes to ROS.  Patient has developed a callus under the outside ball of left foot.  Podiatric Exam: Vascular: dorsalis pedis and posterior tibial pulses are palpable bilateral. Capillary return is immediate. Temperature gradient is WNL. Skin turgor WNL  Sensorium: Normal Semmes Weinstein monofilament test. Normal tactile sensation bilaterally. Nail Exam: Pt has thick disfigured discolored nails with subungual debris noted bilateral entire nail hallux through fifth toenails Ulcer Exam: There is no evidence of ulcer or pre-ulcerative changes or infection. Orthopedic Exam: Muscle tone and strength are WNL. No limitations in general ROM. No crepitus or effusions noted. Foot type and digits show no abnormalities. Bony prominences are unremarkable. Skin:  Porokeratosis sub 5th met left foot asymptomatic. No infection or ulcers  Diagnosis:  Onychomycosis, , Pain in right toe, pain in left toes  Treatment & Plan Procedures and Treatment: Consent by patient was obtained for treatment procedures.   Debridement of mycotic and hypertrophic toenails, 1 through 5 bilateral and clearing of subungual debris. No ulceration, no infection noted. Return Visit-Office Procedure: Patient instructed to return to the office for a follow up visit 3 months for continued evaluation and treatment.    Gardiner Barefoot DPM

## 2018-11-10 ENCOUNTER — Other Ambulatory Visit: Payer: Self-pay | Admitting: Internal Medicine

## 2018-11-10 NOTE — Telephone Encounter (Signed)
Copied from [place] [id number]. Topic: Quick Communication - Rx Refill/Question >> Nov 10, 2018 11:04 AM Burchel, Abbi R wrote: Medication: amLODipine (NORVASC) 5 MG tablet, lovastatin (MEVACOR) 20 MG tablet  Preferred Pharmacy: [id number] [address], [place]  [phone] (Phone) [phone] (Fax)  Pt was advised that RX refills may take up to 3 business days. We ask that you follow-up with your pharmacy.

## 2018-11-11 ENCOUNTER — Other Ambulatory Visit: Payer: Self-pay

## 2018-11-11 ENCOUNTER — Ambulatory Visit (INDEPENDENT_AMBULATORY_CARE_PROVIDER_SITE_OTHER): Payer: Medicare Other

## 2018-11-11 DIAGNOSIS — E538 Deficiency of other specified B group vitamins: Secondary | ICD-10-CM

## 2018-11-11 MED ORDER — CYANOCOBALAMIN 1000 MCG/ML IJ SOLN
1000.0000 ug | Freq: Once | INTRAMUSCULAR | Status: AC
  Administered 2018-11-11: 1000 ug via INTRAMUSCULAR

## 2018-11-11 MED ORDER — AMLODIPINE BESYLATE 5 MG PO TABS: 5.0000 mg | ORAL_TABLET | Freq: Every day | ORAL | 3 refills | Status: DC

## 2018-11-11 MED ORDER — LOVASTATIN 20 MG PO TABS: 20.0000 mg | ORAL_TABLET | Freq: Every day | ORAL | 0 refills | Status: DC

## 2018-11-11 NOTE — Progress Notes (Signed)
Pt was seen today for a NV for B-12 shot given IM in the LD. Pt tolerated well.

## 2018-11-18 ENCOUNTER — Other Ambulatory Visit (INDEPENDENT_AMBULATORY_CARE_PROVIDER_SITE_OTHER): Payer: Medicare Other

## 2018-11-18 DIAGNOSIS — E7800 Pure hypercholesterolemia, unspecified: Secondary | ICD-10-CM | POA: Diagnosis not present

## 2018-11-18 DIAGNOSIS — E119 Type 2 diabetes mellitus without complications: Secondary | ICD-10-CM | POA: Diagnosis not present

## 2018-11-18 DIAGNOSIS — D649 Anemia, unspecified: Secondary | ICD-10-CM

## 2018-11-18 LAB — HEPATIC FUNCTION PANEL
ALT: 19 U/L (ref 0–53)
AST: 19 U/L (ref 0–37)
Albumin: 3.9 g/dL (ref 3.5–5.2)
Alkaline Phosphatase: 35 U/L — ABNORMAL LOW (ref 39–117)
BILIRUBIN TOTAL: 0.7 mg/dL (ref 0.2–1.2)
Bilirubin, Direct: 0.1 mg/dL (ref 0.0–0.3)
Total Protein: 7.2 g/dL (ref 6.0–8.3)

## 2018-11-18 LAB — BASIC METABOLIC PANEL
BUN: 15 mg/dL (ref 6–23)
CO2: 29 mEq/L (ref 19–32)
Calcium: 9.7 mg/dL (ref 8.4–10.5)
Chloride: 101 mEq/L (ref 96–112)
Creatinine, Ser: 0.75 mg/dL (ref 0.40–1.50)
GFR: 103.96 mL/min (ref >60.00)
Glucose, Bld: 158 mg/dL — ABNORMAL HIGH (ref 70–99)
Potassium: 4.8 mEq/L (ref 3.5–5.1)
Sodium: 137 mEq/L (ref 135–145)

## 2018-11-18 LAB — CBC WITH DIFFERENTIAL/PLATELET
BASOS PCT: 1 % (ref 0.0–3.0)
Basophils Absolute: 0.1 10*3/uL (ref 0.0–0.1)
Eosinophils Absolute: 0.1 10*3/uL (ref 0.0–0.7)
Eosinophils Relative: 2 % (ref 0.0–5.0)
HCT: 39.8 % (ref 39.0–52.0)
Hemoglobin: 13.2 g/dL (ref 13.0–17.0)
Lymphocytes Relative: 22.6 % (ref 12.0–46.0)
Lymphs Abs: 1.2 10*3/uL (ref 0.7–4.0)
MCHC: 33.1 g/dL (ref 30.0–36.0)
MCV: 91.9 fl (ref 78.0–100.0)
Monocytes Absolute: 0.4 10*3/uL (ref 0.1–1.0)
Monocytes Relative: 8.4 % (ref 3.0–12.0)
Neutro Abs: 3.5 10*3/uL (ref 1.4–7.7)
Neutrophils Relative %: 66 % (ref 43.0–77.0)
Platelets: 269 10*3/uL (ref 150.0–400.0)
RBC: 4.33 Mil/uL (ref 4.22–5.81)
RDW: 13.1 % (ref 11.5–15.5)
WBC: 5.3 10*3/uL (ref 4.0–10.5)

## 2018-11-18 LAB — LIPID PANEL
Cholesterol: 131 mg/dL (ref 0–200)
HDL: 45.9 mg/dL (ref >39.00)
LDL Cholesterol: 63 mg/dL (ref 0–99)
NonHDL: 85.32
Total CHOL/HDL Ratio: 3
Triglycerides: 114 mg/dL (ref 0.0–149.0)
VLDL: 22.8 mg/dL (ref 0.0–40.0)

## 2018-11-18 LAB — HEMOGLOBIN A1C: HEMOGLOBIN A1C: 7.9 % — AB (ref 4.6–6.5)

## 2018-11-20 ENCOUNTER — Ambulatory Visit (INDEPENDENT_AMBULATORY_CARE_PROVIDER_SITE_OTHER): Payer: Medicare Other | Admitting: Internal Medicine

## 2018-11-20 ENCOUNTER — Encounter: Payer: Self-pay | Admitting: Internal Medicine

## 2018-11-20 DIAGNOSIS — G7000 Myasthenia gravis without (acute) exacerbation: Secondary | ICD-10-CM

## 2018-11-20 DIAGNOSIS — I10 Essential (primary) hypertension: Secondary | ICD-10-CM

## 2018-11-20 DIAGNOSIS — D649 Anemia, unspecified: Secondary | ICD-10-CM

## 2018-11-20 DIAGNOSIS — N400 Enlarged prostate without lower urinary tract symptoms: Secondary | ICD-10-CM

## 2018-11-20 DIAGNOSIS — E538 Deficiency of other specified B group vitamins: Secondary | ICD-10-CM

## 2018-11-20 DIAGNOSIS — E119 Type 2 diabetes mellitus without complications: Secondary | ICD-10-CM

## 2018-11-20 DIAGNOSIS — E7800 Pure hypercholesterolemia, unspecified: Secondary | ICD-10-CM

## 2018-11-20 NOTE — Progress Notes (Signed)
Patient ID: [name], male   DOB: 1925/12/17, 85 y.o.   MRN: [id number]   Subjective:    Patient ID: [name], male    DOB: April 13, 1926, 85 y.o.   MRN: [id number]  HPI  Patient here for a scheduled follow up.  States he is doing well.  Feels good.  Exercising.  Going to the gym.  No chest pain.  No sob.  No acid reflux.  No abdominal pain.  Bowels moving.  No urine change.  His left sciatic pain is better.  Injection helped.  Has been seeing urology.  On flomax.  Saw urology 08/19/18.  Stable.  Recommended f/u in one year.  Overall sugar control slightly improved.  Discussed diet and exercise.  Overall doing better.     Past Medical History:  Diagnosis Date  . Allergic rhinitis   . BPH (benign prostatic hypertrophy)   . Degenerative arthritis   . Diabetes mellitus ([place])   . GERD (gastroesophageal reflux disease)   . HTN (hypertension)   . Hypercholesterolemia   . HYPERTENSION, BENIGN 08/15/2010   Qualifier: Diagnosis of  By: Rockey Situ MD, Tim    . Iron deficiency anemia   . Skin cancer   . TIA (transient ischemic attack) 09/13/2015  . Urinary outflow obstruction    mild   Past Surgical History:  Procedure Laterality Date  . PILONIDAL CYST EXCISION  1951   removal  . [place]  . SKIN CANCER EXCISION     multiple  . SQUAMOUS CELL CARCINOMA EXCISION     behind head  . TONSILLECTOMY AND ADENOIDECTOMY  1938   Family History  Problem Relation Age of Onset  . Heart disease Father        heart atack  . Alzheimer's disease Mother   . CVA Brother    Social History   Socioeconomic History  . Marital status: Married    Spouse name: Not on file  . Number of children: Not on file  . Years of education: Not on file  . Highest education level: Not on file  Occupational History  . Not on file  Social Needs  . Financial resource strain: Not on file  . Food insecurity:    Worry: Not on file    Inability: Not on file  . Transportation needs:      Medical: Not on file    Non-medical: Not on file  Tobacco Use  . Smoking status: Never Smoker  . Smokeless tobacco: Never Used  Substance and Sexual Activity  . Alcohol use: No    Alcohol/week: 0.0 standard drinks  . Drug use: No  . Sexual activity: Not on file  Lifestyle  . Physical activity:    Days per week: Not on file    Minutes per session: Not on file  . Stress: Not on file  Relationships  . Social connections:    Talks on phone: Not on file    Gets together: Not on file    Attends religious service: Not on file    Active member of club or organization: Not on file    Attends meetings of clubs or organizations: Not on file    Relationship status: Not on file  Other Topics Concern  . Not on file  Social History Narrative   Retired, married. Walks everyday          Outpatient Encounter Medications as of 11/20/2018  Medication Sig  . ACCU-CHEK FASTCLIX LANCETS MISC Use daily  as directed to check sugars twice daily. DX E11.9  . amLODipine (NORVASC) 5 MG tablet Take 1 tablet (5 mg total) by mouth daily.  Marland Kitchen aspirin (ASPIR-LOW) 81 MG EC tablet Take 81 mg by mouth daily.    . calcium-vitamin D (OSCAL 500/200 D-3) 500-200 MG-UNIT per tablet Take 1 tablet by mouth 2 (two) times daily.   . cyanocobalamin (,VITAMIN B-12,) 1000 MCG/ML injection Inject 1,000 mcg into the muscle every 30 (thirty) days.  Marland Kitchen docusate sodium (COLACE) 100 MG capsule Take by mouth.  . finasteride (PROSCAR) 5 MG tablet Take 1 tablet (5 mg total) by mouth daily.  Marland Kitchen glucose blood (ACCU-CHEK GUIDE) test strip Use twice daily to check blood sugars Dx E11.9  . HYDROcodone-acetaminophen (NORCO/VICODIN) 5-325 MG tablet   . lovastatin (MEVACOR) 20 MG tablet Take 1 tablet (20 mg total) by mouth at bedtime.  . metFORMIN (GLUCOPHAGE-XR) 500 MG 24 hr tablet TAKE 1 TABLET BY MOUTH ONCE A DAY WITH BREAKFAST  . mycophenolate (CELLCEPT) 500 MG tablet Take 2 tablets (1,000 mg total) by mouth 2 (two) times daily.  Marland Kitchen  olmesartan (BENICAR) 40 MG tablet Take 1 tablet (40 mg total) by mouth daily.  . tamsulosin (FLOMAX) 0.4 MG CAPS capsule Take 1 capsule (0.4 mg total) by mouth daily after supper.   No facility-administered encounter medications on file as of 11/20/2018.     Review of Systems  Constitutional: Negative for appetite change and unexpected weight change.  HENT: Negative for congestion and sinus pressure.   Respiratory: Negative for cough, chest tightness and shortness of breath.   Cardiovascular: Negative for chest pain, palpitations and leg swelling.  Gastrointestinal: Negative for abdominal pain, diarrhea, nausea and vomiting.  Genitourinary: Negative for difficulty urinating and dysuria.  Musculoskeletal: Negative for joint swelling and myalgias.  Skin: Negative for color change and rash.  Neurological: Negative for dizziness, light-headedness and headaches.  Psychiatric/Behavioral: Negative for agitation and dysphoric mood.       Objective:    Physical Exam Constitutional:      General: He is not in acute distress.    Appearance: Normal appearance. He is well-developed.  HENT:     Nose: Nose normal. No congestion.     Mouth/Throat:     Pharynx: No oropharyngeal exudate or posterior oropharyngeal erythema.  Neck:     Musculoskeletal: Neck supple. No muscular tenderness.  Cardiovascular:     Rate and Rhythm: Normal rate and regular rhythm.  Pulmonary:     Effort: Pulmonary effort is normal. No respiratory distress.     Breath sounds: Normal breath sounds.  Abdominal:     General: Bowel sounds are normal.     Palpations: Abdomen is soft.     Tenderness: There is no abdominal tenderness.  Musculoskeletal:        General: No tenderness.     Comments: No increased swelling.   Lymphadenopathy:     Cervical: No cervical adenopathy.  Skin:    Findings: No erythema or rash.  Neurological:     Mental Status: He is alert.  Psychiatric:        Mood and Affect: Mood normal.         Behavior: Behavior normal.     BP 138/72   Pulse 69   Temp 98.2 F (36.8 C)   Ht '5\' 9"'  (1.753 m)   Wt 227 lb 3.2 oz (103.1 kg)   SpO2 95%   BMI 33.55 kg/m  Wt Readings from Last 3 Encounters:  11/20/18  227 lb 3.2 oz (103.1 kg)  08/19/18 230 lb 9.6 oz (104.6 kg)  08/13/18 230 lb 8 oz (104.6 kg)     Lab Results  Component Value Date   WBC 5.3 11/18/2018   HGB 13.2 11/18/2018   HCT 39.8 11/18/2018   PLT 269.0 11/18/2018   GLUCOSE 158 (H) 11/18/2018   CHOL 131 11/18/2018   TRIG 114.0 11/18/2018   HDL 45.90 11/18/2018   LDLCALC 63 11/18/2018   ALT 19 11/18/2018   AST 19 11/18/2018   NA 137 11/18/2018   K 4.8 11/18/2018   CL 101 11/18/2018   CREATININE 0.75 11/18/2018   BUN 15 11/18/2018   CO2 29 11/18/2018   TSH 2.52 08/12/2018   PSA 0.02 (L) 04/16/2018   HGBA1C 7.9 (H) 11/18/2018   MICROALBUR 4.1 (H) 08/12/2018    Mr Lumbar Spine Wo Contrast  Addendum Date: 05/01/2018   ADDENDUM REPORT: 05/01/2018 17:12 ADDENDUM: Chronic T12, L2, L3, and L5 compression fractures. Electronically Signed   By: Logan Bores M.D.   On: 05/01/2018 17:12   Result Date: 05/01/2018 CLINICAL DATA:  Lumbar radiculopathy. Low back and left leg pain for couple of months. EXAM: MRI LUMBAR SPINE WITHOUT CONTRAST TECHNIQUE: Multiplanar, multisequence MR imaging of the lumbar spine was performed. No intravenous contrast was administered. COMPARISON:  None. FINDINGS: Segmentation:  Standard. Alignment: Mild lumbar levoscoliosis. 3 mm anterolisthesis of L3 on L4. Vertebrae: T12, L2, L3, and L5 compression fractures with mild-to-moderate vertebral body height loss and no significant marrow edema. 3 mm retropulsion of the posterosuperior T12 vertebral body. Mild periarticular marrow and soft tissue edema associated with left L5-S1 facet arthritis. Conus medullaris and cauda equina: Conus extends to the lower L1 level. Conus and cauda equina appear normal. Paraspinal and other soft tissues: Mild left lower  lumbar posterior paraspinal muscle, possibly strain. Disc levels: Disc desiccation throughout the lower thoracic and lumbar spine. Mild disc space narrowing at L2-3 and L4-5. T11-12: Mild T12 superior endplate retropulsion, mild disc bulging, and mild facet arthrosis result in borderline to mild spinal stenosis without neural foraminal stenosis. T12-L1: Mild disc bulging and moderate facet hypertrophy without stenosis. L1-2: Mild circumferential disc bulging and moderate facet hypertrophy result in minimal right neural foraminal narrowing without spinal stenosis. L2-3: Circumferential disc bulging, a broad right subarticular to foraminal disc protrusion, and moderate facet hypertrophy result in minimal right lateral recess narrowing without spinal or neural foraminal stenosis. L3-4: Anterolisthesis with slight bulging of uncovered disc, mildly prominent dorsal epidural fat, and severe facet hypertrophy result in mild-to-moderate spinal stenosis, mild bilateral lateral recess stenosis, and mild bilateral neural foraminal stenosis. There is likely facet ankylosis bilaterally. L4-5: Disc bulging, mild ligamentum flavum hypertrophy, and moderate left greater than right facet hypertrophy result in mild spinal stenosis, mild-to-moderate left greater than right lateral recess stenosis, and minimal bilateral neural foraminal stenosis. L5-S1: Disc bulging and severe left greater than right facet arthrosis result in mild left lateral recess stenosis and mild right and severe left neural foraminal stenosis with potential left L5 nerve root impingement. There is a left facet joint effusion, and a 6 x 3 mm synovial cyst is present anterior to the left facet joint in close proximity to the exiting left L5 nerve at the lateral aspect of the foramen though without frank nerve compression. IMPRESSION: 1. Severe left L5-S1 facet arthritis with associated joint effusion and edema. Severe left neural foraminal stenosis with potential  left L5 nerve root impingement. 2. Mild-to-moderate spinal and lateral recess  stenosis at L3-4 and L4-5. Electronically Signed: By: Logan Bores M.D. On: 05/01/2018 13:20       Assessment & Plan:   Problem List Items Addressed This Visit    Anemia    Follow cbc.        Relevant Orders   CBC with Differential/Platelet   B12 deficiency    Continue b12 injections.        BPH (benign prostatic hyperplasia)    Sees urology.  On flomax.        Diabetes mellitus ([place])    Low carb diet and exercise.  Discussed a1c results.  Follow met b and a1c.  Hold on changing medication. a1c 7.78 - ok - 85 years old.        Relevant Orders   Hemoglobin K5W   Basic metabolic panel   Hypercholesterolemia    On lovastatin.  Low cholesterol diet and exercise.  Follow lipid panel and liver function tests.        Relevant Orders   Hepatic function panel   Lipid panel   HYPERTENSION, BENIGN    Blood pressure on recheck improved.  conitnue same medication regimen.  Follow pressures.  Follow metabolic panel.        Myasthenia gravis ([place])    On cellcept.  Followed by neurology.  Stable.  Follow cbc.            Einar Pheasant, MD

## 2018-11-23 ENCOUNTER — Encounter: Payer: Self-pay | Admitting: Internal Medicine

## 2018-11-23 NOTE — Assessment & Plan Note (Signed)
Low carb diet and exercise.  Discussed a1c results.  Follow met b and a1c.  Hold on changing medication. a1c 7.3 - ok - 85 years old.

## 2018-11-23 NOTE — Assessment & Plan Note (Signed)
On cellcept.  Followed by neurology.  Stable.  Follow cbc.   

## 2018-11-23 NOTE — Assessment & Plan Note (Signed)
Follow cbc.  

## 2018-11-23 NOTE — Assessment & Plan Note (Signed)
Sees urology.  On flomax.

## 2018-11-23 NOTE — Assessment & Plan Note (Signed)
On lovastatin.  Low cholesterol diet and exercise.  Follow lipid panel and liver function tests.   

## 2018-11-23 NOTE — Assessment & Plan Note (Signed)
Continue b12 injections.  

## 2018-11-23 NOTE — Assessment & Plan Note (Signed)
Blood pressure on recheck improved.  conitnue same medication regimen.  Follow pressures.  Follow metabolic panel.

## 2018-11-27 DIAGNOSIS — M9902 Segmental and somatic dysfunction of thoracic region: Secondary | ICD-10-CM | POA: Diagnosis not present

## 2018-11-27 DIAGNOSIS — M62830 Muscle spasm of back: Secondary | ICD-10-CM | POA: Diagnosis not present

## 2018-11-27 DIAGNOSIS — M9901 Segmental and somatic dysfunction of cervical region: Secondary | ICD-10-CM | POA: Diagnosis not present

## 2018-11-27 DIAGNOSIS — M5033 Other cervical disc degeneration, cervicothoracic region: Secondary | ICD-10-CM | POA: Diagnosis not present

## 2018-12-17 ENCOUNTER — Ambulatory Visit (INDEPENDENT_AMBULATORY_CARE_PROVIDER_SITE_OTHER): Payer: Medicare Other

## 2018-12-17 DIAGNOSIS — E538 Deficiency of other specified B group vitamins: Secondary | ICD-10-CM | POA: Diagnosis not present

## 2018-12-17 MED ORDER — CYANOCOBALAMIN 1000 MCG/ML IJ SOLN
1000.0000 ug | Freq: Once | INTRAMUSCULAR | Status: AC
  Administered 2018-12-17: 1000 ug via INTRAMUSCULAR

## 2018-12-17 NOTE — Progress Notes (Addendum)
Pt seen today for NV for B12. Given IM in LD. Tolerated well.  Reviewed.  Dr Nicki Reaper

## 2018-12-23 DIAGNOSIS — M9902 Segmental and somatic dysfunction of thoracic region: Secondary | ICD-10-CM | POA: Diagnosis not present

## 2018-12-23 DIAGNOSIS — M5033 Other cervical disc degeneration, cervicothoracic region: Secondary | ICD-10-CM | POA: Diagnosis not present

## 2018-12-23 DIAGNOSIS — M62830 Muscle spasm of back: Secondary | ICD-10-CM | POA: Diagnosis not present

## 2018-12-23 DIAGNOSIS — M9901 Segmental and somatic dysfunction of cervical region: Secondary | ICD-10-CM | POA: Diagnosis not present

## 2018-12-29 DIAGNOSIS — L578 Other skin changes due to chronic exposure to nonionizing radiation: Secondary | ICD-10-CM | POA: Diagnosis not present

## 2018-12-29 DIAGNOSIS — L570 Actinic keratosis: Secondary | ICD-10-CM | POA: Diagnosis not present

## 2018-12-29 DIAGNOSIS — L821 Other seborrheic keratosis: Secondary | ICD-10-CM | POA: Diagnosis not present

## 2018-12-31 DIAGNOSIS — H02054 Trichiasis without entropian left upper eyelid: Secondary | ICD-10-CM | POA: Diagnosis not present

## 2019-01-04 HISTORY — PX: PARTIAL HIP ARTHROPLASTY: SHX733

## 2019-01-13 DIAGNOSIS — M9901 Segmental and somatic dysfunction of cervical region: Secondary | ICD-10-CM | POA: Diagnosis not present

## 2019-01-13 DIAGNOSIS — M9902 Segmental and somatic dysfunction of thoracic region: Secondary | ICD-10-CM | POA: Diagnosis not present

## 2019-01-13 DIAGNOSIS — M62830 Muscle spasm of back: Secondary | ICD-10-CM | POA: Diagnosis not present

## 2019-01-13 DIAGNOSIS — M5033 Other cervical disc degeneration, cervicothoracic region: Secondary | ICD-10-CM | POA: Diagnosis not present

## 2019-01-19 ENCOUNTER — Other Ambulatory Visit: Payer: Self-pay | Admitting: Internal Medicine

## 2019-01-19 NOTE — Telephone Encounter (Signed)
Copied from [place] [id number]. Topic: Quick Communication - Rx Refill/Question >> Jan 19, 2019  1:03 PM Bea Graff, NT wrote: Medication: olmesartan (BENICAR) 40 MG tablet and metFORMIN (GLUCOPHAGE-XR) 500 MG 24 hr tablet  Has the patient contacted their pharmacy? Yes.   (Agent: If no, request that the patient contact the pharmacy for the refill.) (Agent: If yes, when and what did the pharmacy advise?)  Preferred Pharmacy (with phone number or street name): [id number] [address], [place] [phone] (Phone) [phone] (Fax)    Agent: Please be advised that RX refills may take up to 3 business days. We ask that you follow-up with your pharmacy.

## 2019-01-20 ENCOUNTER — Other Ambulatory Visit: Payer: Self-pay

## 2019-01-20 ENCOUNTER — Ambulatory Visit (INDEPENDENT_AMBULATORY_CARE_PROVIDER_SITE_OTHER): Payer: Medicare Other

## 2019-01-20 DIAGNOSIS — I10 Essential (primary) hypertension: Secondary | ICD-10-CM

## 2019-01-20 DIAGNOSIS — E119 Type 2 diabetes mellitus without complications: Secondary | ICD-10-CM

## 2019-01-20 DIAGNOSIS — E538 Deficiency of other specified B group vitamins: Secondary | ICD-10-CM | POA: Diagnosis not present

## 2019-01-20 MED ORDER — OLMESARTAN MEDOXOMIL 40 MG PO TABS: 40.0000 mg | ORAL_TABLET | Freq: Every day | ORAL | 2 refills | Status: DC

## 2019-01-20 MED ORDER — CYANOCOBALAMIN 1000 MCG/ML IJ SOLN
1000.0000 ug | Freq: Once | INTRAMUSCULAR | Status: AC
  Administered 2019-01-20: 1000 ug via INTRAMUSCULAR

## 2019-01-20 MED ORDER — METFORMIN HCL ER 500 MG PO TB24: ORAL_TABLET | ORAL | 0 refills | Status: DC

## 2019-01-20 NOTE — Progress Notes (Addendum)
Arno F Dix presents today for injection per MD orders. B12 injection administered IM in right Upper Arm. Administration without incident. Patient tolerated well.  Eligio Angert,cma  Reviewed.  Dr Scott 

## 2019-01-27 DIAGNOSIS — S52501A Unspecified fracture of the lower end of right radius, initial encounter for closed fracture: Secondary | ICD-10-CM | POA: Insufficient documentation

## 2019-01-27 DIAGNOSIS — M25531 Pain in right wrist: Secondary | ICD-10-CM | POA: Diagnosis not present

## 2019-01-27 DIAGNOSIS — M8588 Other specified disorders of bone density and structure, other site: Secondary | ICD-10-CM | POA: Diagnosis not present

## 2019-01-27 DIAGNOSIS — S52571A Other intraarticular fracture of lower end of right radius, initial encounter for closed fracture: Secondary | ICD-10-CM | POA: Diagnosis not present

## 2019-01-27 DIAGNOSIS — R937 Abnormal findings on diagnostic imaging of other parts of musculoskeletal system: Secondary | ICD-10-CM | POA: Diagnosis not present

## 2019-02-05 ENCOUNTER — Ambulatory Visit: Payer: Medicare Other | Admitting: Podiatry

## 2019-02-07 DIAGNOSIS — Z1159 Encounter for screening for other viral diseases: Secondary | ICD-10-CM | POA: Diagnosis not present

## 2019-02-07 DIAGNOSIS — Z7189 Other specified counseling: Secondary | ICD-10-CM | POA: Diagnosis not present

## 2019-02-07 DIAGNOSIS — S72001D Fracture of unspecified part of neck of right femur, subsequent encounter for closed fracture with routine healing: Secondary | ICD-10-CM | POA: Diagnosis not present

## 2019-02-07 DIAGNOSIS — E871 Hypo-osmolality and hyponatremia: Secondary | ICD-10-CM | POA: Diagnosis not present

## 2019-02-07 DIAGNOSIS — M549 Dorsalgia, unspecified: Secondary | ICD-10-CM | POA: Diagnosis present

## 2019-02-07 DIAGNOSIS — N400 Enlarged prostate without lower urinary tract symptoms: Secondary | ICD-10-CM | POA: Diagnosis not present

## 2019-02-07 DIAGNOSIS — Z96641 Presence of right artificial hip joint: Secondary | ICD-10-CM | POA: Diagnosis not present

## 2019-02-07 DIAGNOSIS — R262 Difficulty in walking, not elsewhere classified: Secondary | ICD-10-CM | POA: Diagnosis not present

## 2019-02-07 DIAGNOSIS — E222 Syndrome of inappropriate secretion of antidiuretic hormone: Secondary | ICD-10-CM | POA: Diagnosis not present

## 2019-02-07 DIAGNOSIS — E785 Hyperlipidemia, unspecified: Secondary | ICD-10-CM | POA: Diagnosis not present

## 2019-02-07 DIAGNOSIS — W1839XA Other fall on same level, initial encounter: Secondary | ICD-10-CM | POA: Diagnosis not present

## 2019-02-07 DIAGNOSIS — M6281 Muscle weakness (generalized): Secondary | ICD-10-CM | POA: Diagnosis not present

## 2019-02-07 DIAGNOSIS — M545 Low back pain: Secondary | ICD-10-CM | POA: Diagnosis not present

## 2019-02-07 DIAGNOSIS — M25551 Pain in right hip: Secondary | ICD-10-CM | POA: Diagnosis not present

## 2019-02-07 DIAGNOSIS — R278 Other lack of coordination: Secondary | ICD-10-CM | POA: Diagnosis not present

## 2019-02-07 DIAGNOSIS — Z9181 History of falling: Secondary | ICD-10-CM | POA: Diagnosis not present

## 2019-02-07 DIAGNOSIS — I10 Essential (primary) hypertension: Secondary | ICD-10-CM | POA: Diagnosis present

## 2019-02-07 DIAGNOSIS — E119 Type 2 diabetes mellitus without complications: Secondary | ICD-10-CM | POA: Diagnosis present

## 2019-02-07 DIAGNOSIS — Z7409 Other reduced mobility: Secondary | ICD-10-CM | POA: Diagnosis not present

## 2019-02-07 DIAGNOSIS — G7000 Myasthenia gravis without (acute) exacerbation: Secondary | ICD-10-CM | POA: Diagnosis not present

## 2019-02-07 DIAGNOSIS — G8929 Other chronic pain: Secondary | ICD-10-CM | POA: Diagnosis present

## 2019-02-07 DIAGNOSIS — G8911 Acute pain due to trauma: Secondary | ICD-10-CM | POA: Diagnosis not present

## 2019-02-07 DIAGNOSIS — Y33XXXS Other specified events, undetermined intent, sequela: Secondary | ICD-10-CM | POA: Diagnosis not present

## 2019-02-07 DIAGNOSIS — S72041A Displaced fracture of base of neck of right femur, initial encounter for closed fracture: Secondary | ICD-10-CM | POA: Diagnosis not present

## 2019-02-07 DIAGNOSIS — S72001A Fracture of unspecified part of neck of right femur, initial encounter for closed fracture: Secondary | ICD-10-CM | POA: Diagnosis not present

## 2019-02-07 DIAGNOSIS — Z741 Need for assistance with personal care: Secondary | ICD-10-CM | POA: Diagnosis not present

## 2019-02-07 DIAGNOSIS — W010XXA Fall on same level from slipping, tripping and stumbling without subsequent striking against object, initial encounter: Secondary | ICD-10-CM | POA: Diagnosis not present

## 2019-02-07 DIAGNOSIS — R5381 Other malaise: Secondary | ICD-10-CM | POA: Diagnosis not present

## 2019-02-07 DIAGNOSIS — S72001S Fracture of unspecified part of neck of right femur, sequela: Secondary | ICD-10-CM | POA: Diagnosis not present

## 2019-02-07 DIAGNOSIS — D638 Anemia in other chronic diseases classified elsewhere: Secondary | ICD-10-CM | POA: Diagnosis not present

## 2019-02-07 DIAGNOSIS — W19XXXA Unspecified fall, initial encounter: Secondary | ICD-10-CM | POA: Diagnosis not present

## 2019-02-07 DIAGNOSIS — W19XXXD Unspecified fall, subsequent encounter: Secondary | ICD-10-CM | POA: Diagnosis not present

## 2019-02-07 DIAGNOSIS — K5900 Constipation, unspecified: Secondary | ICD-10-CM | POA: Diagnosis not present

## 2019-02-07 DIAGNOSIS — Y33XXXA Other specified events, undetermined intent, initial encounter: Secondary | ICD-10-CM | POA: Diagnosis not present

## 2019-02-07 DIAGNOSIS — D649 Anemia, unspecified: Secondary | ICD-10-CM | POA: Diagnosis not present

## 2019-02-07 DIAGNOSIS — S72091A Other fracture of head and neck of right femur, initial encounter for closed fracture: Secondary | ICD-10-CM | POA: Diagnosis not present

## 2019-02-07 DIAGNOSIS — R52 Pain, unspecified: Secondary | ICD-10-CM | POA: Diagnosis not present

## 2019-02-07 DIAGNOSIS — M8580 Other specified disorders of bone density and structure, unspecified site: Secondary | ICD-10-CM | POA: Diagnosis not present

## 2019-02-07 DIAGNOSIS — E539 Vitamin B deficiency, unspecified: Secondary | ICD-10-CM | POA: Diagnosis not present

## 2019-02-07 DIAGNOSIS — Z471 Aftercare following joint replacement surgery: Secondary | ICD-10-CM | POA: Diagnosis not present

## 2019-02-07 DIAGNOSIS — G8918 Other acute postprocedural pain: Secondary | ICD-10-CM | POA: Diagnosis not present

## 2019-02-10 DIAGNOSIS — S72001S Fracture of unspecified part of neck of right femur, sequela: Secondary | ICD-10-CM | POA: Insufficient documentation

## 2019-02-10 DIAGNOSIS — S72001A Fracture of unspecified part of neck of right femur, initial encounter for closed fracture: Secondary | ICD-10-CM | POA: Insufficient documentation

## 2019-02-11 ENCOUNTER — Telehealth: Payer: Self-pay

## 2019-02-11 NOTE — Telephone Encounter (Signed)
Noted. Please call pts daughter-n-law Orion Crook) and see how pt doing.  Also, tell her to keep [place] posted.

## 2019-02-11 NOTE — Telephone Encounter (Signed)
FYI pt is in the hospital at [facility]. I will reach out to reschedule appt once pt has been discharged.

## 2019-02-11 NOTE — Telephone Encounter (Signed)
Copied from [place] [phone]. Topic: Appointment Scheduling - Scheduling Inquiry for Clinic >> Feb 11, 2019  2:05 PM Margot Ables wrote: Reason for CRM: Pt dtr-in-law called to cancel appts 4/21 for lab and 4/23 with Dr. Nicki Reaper. Pt had fall 3/23 and this past weekend had trouble walking. He is inpt at [facility] and had a hip fracture. Pt still in the hospital.

## 2019-02-12 NOTE — Telephone Encounter (Signed)
Left a message for daughter in law

## 2019-02-16 ENCOUNTER — Other Ambulatory Visit: Payer: Self-pay

## 2019-02-16 DIAGNOSIS — E785 Hyperlipidemia, unspecified: Secondary | ICD-10-CM | POA: Diagnosis not present

## 2019-02-16 DIAGNOSIS — W19XXXD Unspecified fall, subsequent encounter: Secondary | ICD-10-CM | POA: Diagnosis not present

## 2019-02-16 DIAGNOSIS — E222 Syndrome of inappropriate secretion of antidiuretic hormone: Secondary | ICD-10-CM | POA: Diagnosis not present

## 2019-02-16 DIAGNOSIS — G7001 Myasthenia gravis with (acute) exacerbation: Secondary | ICD-10-CM | POA: Diagnosis not present

## 2019-02-16 DIAGNOSIS — G7000 Myasthenia gravis without (acute) exacerbation: Secondary | ICD-10-CM | POA: Diagnosis not present

## 2019-02-16 DIAGNOSIS — I10 Essential (primary) hypertension: Secondary | ICD-10-CM | POA: Diagnosis not present

## 2019-02-16 DIAGNOSIS — R278 Other lack of coordination: Secondary | ICD-10-CM | POA: Diagnosis not present

## 2019-02-16 DIAGNOSIS — N400 Enlarged prostate without lower urinary tract symptoms: Secondary | ICD-10-CM | POA: Diagnosis not present

## 2019-02-16 DIAGNOSIS — E119 Type 2 diabetes mellitus without complications: Secondary | ICD-10-CM | POA: Diagnosis not present

## 2019-02-16 DIAGNOSIS — S72001S Fracture of unspecified part of neck of right femur, sequela: Secondary | ICD-10-CM | POA: Diagnosis not present

## 2019-02-16 DIAGNOSIS — R262 Difficulty in walking, not elsewhere classified: Secondary | ICD-10-CM | POA: Diagnosis not present

## 2019-02-16 DIAGNOSIS — S72001A Fracture of unspecified part of neck of right femur, initial encounter for closed fracture: Secondary | ICD-10-CM | POA: Diagnosis not present

## 2019-02-16 DIAGNOSIS — Z9181 History of falling: Secondary | ICD-10-CM | POA: Diagnosis not present

## 2019-02-16 DIAGNOSIS — Z741 Need for assistance with personal care: Secondary | ICD-10-CM | POA: Diagnosis not present

## 2019-02-16 DIAGNOSIS — M6281 Muscle weakness (generalized): Secondary | ICD-10-CM | POA: Diagnosis not present

## 2019-02-16 DIAGNOSIS — E871 Hypo-osmolality and hyponatremia: Secondary | ICD-10-CM | POA: Diagnosis not present

## 2019-02-16 DIAGNOSIS — E539 Vitamin B deficiency, unspecified: Secondary | ICD-10-CM | POA: Diagnosis not present

## 2019-02-16 DIAGNOSIS — D649 Anemia, unspecified: Secondary | ICD-10-CM | POA: Diagnosis not present

## 2019-02-16 DIAGNOSIS — S72001D Fracture of unspecified part of neck of right femur, subsequent encounter for closed fracture with routine healing: Secondary | ICD-10-CM | POA: Diagnosis not present

## 2019-02-16 DIAGNOSIS — K5900 Constipation, unspecified: Secondary | ICD-10-CM | POA: Diagnosis not present

## 2019-02-16 MED ORDER — FINASTERIDE 5 MG PO TABS
5.00 | ORAL_TABLET | ORAL | Status: DC
Start: 2019-02-17 — End: 2019-02-16

## 2019-02-16 MED ORDER — LOVASTATIN 20 MG PO TABS: 20.0000 mg | ORAL_TABLET | Freq: Every day | ORAL | 1 refills | Status: DC

## 2019-02-16 MED ORDER — POLYETHYLENE GLYCOL 3350 17 G PO PACK
17.00 | PACK | ORAL | Status: DC
Start: 2019-02-17 — End: 2019-02-16

## 2019-02-16 MED ORDER — LIDOCAINE HCL (PF) 1 % IJ SOLN
0.50 | INTRAMUSCULAR | Status: DC
Start: ? — End: 2019-02-16

## 2019-02-16 MED ORDER — SENNOSIDES-DOCUSATE SODIUM 8.6-50 MG PO TABS
2.00 | ORAL_TABLET | ORAL | Status: DC
Start: 2019-02-16 — End: 2019-02-16

## 2019-02-16 MED ORDER — CHOLECALCIFEROL 25 MCG (1000 UT) PO TABS
1000.00 | ORAL_TABLET | ORAL | Status: DC
Start: 2019-02-17 — End: 2019-02-16

## 2019-02-16 MED ORDER — OXYCODONE HCL 5 MG PO TABS
5.00 | ORAL_TABLET | ORAL | Status: DC
Start: ? — End: 2019-02-16

## 2019-02-16 MED ORDER — CALCIUM CARBONATE ANTACID 750 MG PO CHEW
CHEWABLE_TABLET | ORAL | Status: DC
Start: 2019-02-17 — End: 2019-02-16

## 2019-02-16 MED ORDER — ACETAMINOPHEN 325 MG PO TABS
975.00 | ORAL_TABLET | ORAL | Status: DC
Start: 2019-02-16 — End: 2019-02-16

## 2019-02-16 MED ORDER — DEXTROSE 50 % IV SOLN
12.50 | INTRAVENOUS | Status: DC
Start: ? — End: 2019-02-16

## 2019-02-16 MED ORDER — AMLODIPINE BESYLATE 5 MG PO TABS
5.00 | ORAL_TABLET | ORAL | Status: DC
Start: 2019-02-17 — End: 2019-02-16

## 2019-02-16 MED ORDER — TAMSULOSIN HCL 0.4 MG PO CAPS
0.40 | ORAL_CAPSULE | ORAL | Status: DC
Start: 2019-02-17 — End: 2019-02-16

## 2019-02-16 MED ORDER — LOVASTATIN 20 MG PO TABS
20.00 | ORAL_TABLET | ORAL | Status: DC
Start: 2019-02-16 — End: 2019-02-16

## 2019-02-16 MED ORDER — MYCOPHENOLATE MOFETIL 500 MG PO TABS
1000.00 | ORAL_TABLET | ORAL | Status: DC
Start: 2019-02-16 — End: 2019-02-16

## 2019-02-16 MED ORDER — OLMESARTAN MEDOXOMIL 40 MG PO TABS
40.00 | ORAL_TABLET | ORAL | Status: DC
Start: 2019-02-17 — End: 2019-02-16

## 2019-02-16 MED ORDER — GLUCAGON HCL RDNA (DIAGNOSTIC) 1 MG IJ SOLR
1.00 | INTRAMUSCULAR | Status: DC
Start: ? — End: 2019-02-16

## 2019-02-16 MED ORDER — GENERIC EXTERNAL MEDICATION
Status: DC
Start: ? — End: 2019-02-16

## 2019-02-16 MED ORDER — ENOXAPARIN SODIUM 40 MG/0.4ML ~~LOC~~ SOLN
40.00 | SUBCUTANEOUS | Status: DC
Start: 2019-02-17 — End: 2019-02-16

## 2019-02-16 NOTE — Telephone Encounter (Signed)
Patient has been moved to [facility] as of 2pm today for rehab. Daughter in law says that he is still non-weight bearing for now but will be at [facility] for a few weeks and hopefully will be able to walk again. They have not been able to see him due to restrictions at the facility and hospital. She says he has done okay with everything and will call if they need anything. She also cancelled his upcoming appts.

## 2019-02-17 DIAGNOSIS — S72001A Fracture of unspecified part of neck of right femur, initial encounter for closed fracture: Secondary | ICD-10-CM

## 2019-02-17 DIAGNOSIS — N400 Enlarged prostate without lower urinary tract symptoms: Secondary | ICD-10-CM | POA: Diagnosis not present

## 2019-02-17 DIAGNOSIS — G7001 Myasthenia gravis with (acute) exacerbation: Secondary | ICD-10-CM | POA: Diagnosis not present

## 2019-02-17 DIAGNOSIS — I10 Essential (primary) hypertension: Secondary | ICD-10-CM

## 2019-02-17 DIAGNOSIS — E119 Type 2 diabetes mellitus without complications: Secondary | ICD-10-CM

## 2019-02-23 LAB — BASIC METABOLIC PANEL
Creatinine: 0.5 — AB (ref 0.6–1.3)
Glucose: 130
Sodium: 127 — AB (ref 137–147)

## 2019-02-24 ENCOUNTER — Other Ambulatory Visit: Payer: Medicare Other

## 2019-02-24 DIAGNOSIS — E222 Syndrome of inappropriate secretion of antidiuretic hormone: Secondary | ICD-10-CM | POA: Diagnosis not present

## 2019-02-26 ENCOUNTER — Ambulatory Visit: Payer: Medicare Other | Admitting: Internal Medicine

## 2019-03-02 LAB — BASIC METABOLIC PANEL WITH GFR
Creatinine: 0.5 — AB (ref <=1.3)
Glucose: 128
Sodium: 132 — AB (ref 137–147)

## 2019-03-08 DIAGNOSIS — S72001D Fracture of unspecified part of neck of right femur, subsequent encounter for closed fracture with routine healing: Secondary | ICD-10-CM | POA: Diagnosis not present

## 2019-03-08 DIAGNOSIS — Z7901 Long term (current) use of anticoagulants: Secondary | ICD-10-CM | POA: Diagnosis not present

## 2019-03-08 DIAGNOSIS — Z7982 Long term (current) use of aspirin: Secondary | ICD-10-CM | POA: Diagnosis not present

## 2019-03-08 DIAGNOSIS — E119 Type 2 diabetes mellitus without complications: Secondary | ICD-10-CM | POA: Diagnosis not present

## 2019-03-08 DIAGNOSIS — Z96641 Presence of right artificial hip joint: Secondary | ICD-10-CM | POA: Diagnosis not present

## 2019-03-08 DIAGNOSIS — Z8673 Personal history of transient ischemic attack (TIA), and cerebral infarction without residual deficits: Secondary | ICD-10-CM | POA: Diagnosis not present

## 2019-03-08 DIAGNOSIS — G7000 Myasthenia gravis without (acute) exacerbation: Secondary | ICD-10-CM | POA: Diagnosis not present

## 2019-03-08 DIAGNOSIS — W19XXXD Unspecified fall, subsequent encounter: Secondary | ICD-10-CM | POA: Diagnosis not present

## 2019-03-08 DIAGNOSIS — Z7984 Long term (current) use of oral hypoglycemic drugs: Secondary | ICD-10-CM | POA: Diagnosis not present

## 2019-03-09 ENCOUNTER — Telehealth: Payer: Self-pay | Admitting: Internal Medicine

## 2019-03-09 NOTE — Telephone Encounter (Signed)
Pt was recently admitted with hip fracture and is s/p ORIF.  I do not mind ok'ing physical therapy orders, but need to clarify a few things first.  Is ortho giving the orders so that PT knows what he is able to do given recent hip surgery.  If so, they may need to sign off on orders and follow.  Let me know if a problem.

## 2019-03-09 NOTE — Telephone Encounter (Signed)
Copied from [place] [id number]. Topic: Quick Communication - See Telephone Encounter >> Mar 09, 2019  9:48 AM Burchel, Abbi R wrote: CRM for notification. See Telephone encounter for: 03/09/19.  Jeani Hawking [facility] [phone]) requesting v/o for PT 2x4wk, and 1x2wk

## 2019-03-09 NOTE — Telephone Encounter (Signed)
Left message for [name] with [facility] to call office.

## 2019-03-10 NOTE — Telephone Encounter (Signed)
Talked with Jeani Hawking at [facility] she is going to call for orders for PT from the surgeon.

## 2019-03-11 DIAGNOSIS — W19XXXD Unspecified fall, subsequent encounter: Secondary | ICD-10-CM | POA: Diagnosis not present

## 2019-03-11 DIAGNOSIS — G7000 Myasthenia gravis without (acute) exacerbation: Secondary | ICD-10-CM | POA: Diagnosis not present

## 2019-03-11 DIAGNOSIS — E119 Type 2 diabetes mellitus without complications: Secondary | ICD-10-CM | POA: Diagnosis not present

## 2019-03-11 DIAGNOSIS — Z96641 Presence of right artificial hip joint: Secondary | ICD-10-CM | POA: Diagnosis not present

## 2019-03-11 DIAGNOSIS — S72001D Fracture of unspecified part of neck of right femur, subsequent encounter for closed fracture with routine healing: Secondary | ICD-10-CM | POA: Diagnosis not present

## 2019-03-11 DIAGNOSIS — Z8673 Personal history of transient ischemic attack (TIA), and cerebral infarction without residual deficits: Secondary | ICD-10-CM | POA: Diagnosis not present

## 2019-03-12 ENCOUNTER — Ambulatory Visit (INDEPENDENT_AMBULATORY_CARE_PROVIDER_SITE_OTHER): Payer: Medicare Other | Admitting: Podiatry

## 2019-03-12 ENCOUNTER — Encounter: Payer: Self-pay | Admitting: Podiatry

## 2019-03-12 ENCOUNTER — Encounter: Payer: Self-pay | Admitting: Internal Medicine

## 2019-03-12 ENCOUNTER — Other Ambulatory Visit: Payer: Self-pay

## 2019-03-12 ENCOUNTER — Ambulatory Visit (INDEPENDENT_AMBULATORY_CARE_PROVIDER_SITE_OTHER): Payer: Medicare Other | Admitting: Internal Medicine

## 2019-03-12 VITALS — Temp 97.6°F

## 2019-03-12 DIAGNOSIS — S52501S Unspecified fracture of the lower end of right radius, sequela: Secondary | ICD-10-CM | POA: Diagnosis not present

## 2019-03-12 DIAGNOSIS — G7000 Myasthenia gravis without (acute) exacerbation: Secondary | ICD-10-CM

## 2019-03-12 DIAGNOSIS — B351 Tinea unguium: Secondary | ICD-10-CM | POA: Diagnosis not present

## 2019-03-12 DIAGNOSIS — M79675 Pain in left toe(s): Secondary | ICD-10-CM | POA: Diagnosis not present

## 2019-03-12 DIAGNOSIS — I10 Essential (primary) hypertension: Secondary | ICD-10-CM

## 2019-03-12 DIAGNOSIS — E7800 Pure hypercholesterolemia, unspecified: Secondary | ICD-10-CM | POA: Diagnosis not present

## 2019-03-12 DIAGNOSIS — M79674 Pain in right toe(s): Secondary | ICD-10-CM | POA: Diagnosis not present

## 2019-03-12 DIAGNOSIS — S72001S Fracture of unspecified part of neck of right femur, sequela: Secondary | ICD-10-CM | POA: Diagnosis not present

## 2019-03-12 DIAGNOSIS — E538 Deficiency of other specified B group vitamins: Secondary | ICD-10-CM | POA: Diagnosis not present

## 2019-03-12 DIAGNOSIS — E119 Type 2 diabetes mellitus without complications: Secondary | ICD-10-CM

## 2019-03-12 DIAGNOSIS — E871 Hypo-osmolality and hyponatremia: Secondary | ICD-10-CM | POA: Diagnosis not present

## 2019-03-12 DIAGNOSIS — D649 Anemia, unspecified: Secondary | ICD-10-CM

## 2019-03-12 NOTE — Progress Notes (Signed)
Complaint:  Visit Type: Patient presents to the office for  preventative foot care services. Complaint: Patient states" my nails have grown long and thick and become painful to walk and wear shoes" Patient has been diagnosed with DM and does not take medication for diabetes.. The patient presents for preventative foot care services. No changes to ROS.    Podiatric Exam: Vascular: dorsalis pedis and posterior tibial pulses are palpable bilateral. Capillary return is immediate. Temperature gradient is WNL. Skin turgor WNL  Sensorium: Normal Semmes Weinstein monofilament test. Normal tactile sensation bilaterally. Nail Exam: Pt has thick disfigured discolored nails with subungual debris noted bilateral entire nail hallux through fifth toenails Ulcer Exam: There is no evidence of ulcer or pre-ulcerative changes or infection. Orthopedic Exam: Muscle tone and strength are WNL. No limitations in general ROM. No crepitus or effusions noted. Foot type and digits show no abnormalities. Bony prominences are unremarkable. Skin:  Porokeratosis sub 5th met left foot asymptomatic. No infection or ulcers  Diagnosis:  Onychomycosis, , Pain in right toe, pain in left toes  Treatment & Plan Procedures and Treatment: Consent by patient was obtained for treatment procedures.   Debridement of mycotic and hypertrophic toenails, 1 through 5 bilateral and clearing of subungual debris. No ulceration, no infection noted. Return Visit-Office Procedure: Patient instructed to return to the office for a follow up visit 3 months for continued evaluation and treatment.    Gardiner Barefoot DPM

## 2019-03-12 NOTE — Progress Notes (Signed)
Patient ID: [name], male   DOB: Dec 12, 1925, 85 y.o.   MRN: [id number]   Virtual Visit via Video Note  This visit type was conducted due to national recommendations for restrictions regarding the COVID-19 pandemic (e.g. social distancing).  This format is felt to be most appropriate for this patient at this time.  All issues noted in this document were discussed and addressed.  No physical exam was performed (except for noted visual exam findings with Video Visits).   I connected with [name] by a video enabled telemedicine application and verified that I am speaking with the correct person using two identifiers. Location patient: home Location provider: work  Persons participating in the virtual visit: patient, provider and pts daughter [name]) and daughter-n-law [name]).    I discussed the limitations, risks, security and privacy concerns of performing an evaluation and management service by video and the availability of in person appointments.  The patient expressed understanding and agreed to proceed.   Reason for visit: acute visit, home from rehab/hospital.    HPI: He was admitted 02/07/19 - 02/16/19 with right hip fracture. S/p ORIF 02/08/19.  Has known myasthenia gravis.  Had problems in hospital with hyponatremia.  Given IVFs.  Discharged to  Rehab facility.  Also suffered right distal radius fracture.  Seeing ortho.  Wearing splint.  Last sodium - checked last week - 132.  Came home 03/06/19.  Getting physical therapy at home - [facility].  States he is getting around with walker.  No chest pain.  Breathing stable.  No acid reflux.  No abdominal pain.  Bowels moving.  Weight 206 pounds.  Sugars averaging 125-134.  Blood pressure doing well.  No significant pain.  Some hip soreness.  Due to see podiatry today.     ROS: See pertinent positives and negatives per HPI.  Past Medical History:  Diagnosis Date  . Allergic rhinitis   . BPH (benign prostatic  hypertrophy)   . Degenerative arthritis   . Diabetes mellitus ([place])   . GERD (gastroesophageal reflux disease)   . HTN (hypertension)   . Hypercholesterolemia   . HYPERTENSION, BENIGN 08/15/2010   Qualifier: Diagnosis of  By: Rockey Situ MD, Tim    . Iron deficiency anemia   . Skin cancer   . TIA (transient ischemic attack) 09/13/2015  . Urinary outflow obstruction    mild    Past Surgical History:  Procedure Laterality Date  . PILONIDAL CYST EXCISION  1951   removal  . [place]  . SKIN CANCER EXCISION     multiple  . SQUAMOUS CELL CARCINOMA EXCISION     behind head  . TONSILLECTOMY AND ADENOIDECTOMY  1938    Family History  Problem Relation Age of Onset  . Heart disease Father        heart atack  . Alzheimer's disease Mother   . CVA Brother     SOCIAL HX: reviewed.    Current Outpatient Medications:  .  ACCU-CHEK FASTCLIX LANCETS MISC, Use daily as directed to check sugars twice daily. DX E11.9, Disp: 102 each, Rfl: 12 .  amLODipine (NORVASC) 5 MG tablet, Take 1 tablet (5 mg total) by mouth daily., Disp: 30 tablet, Rfl: 3 .  aspirin (ASPIR-LOW) 81 MG EC tablet, Take 81 mg by mouth daily.  , Disp: , Rfl:  .  calcium-vitamin D (OSCAL 500/200 D-3) 500-200 MG-UNIT per tablet, Take 1 tablet by mouth 2 (two) times daily. , Disp: ,  Rfl:  .  cyanocobalamin (,VITAMIN B-12,) 1000 MCG/ML injection, Inject 1,000 mcg into the muscle every 30 (thirty) days., Disp: , Rfl:  .  docusate sodium (COLACE) 100 MG capsule, Take by mouth., Disp: , Rfl:  .  finasteride (PROSCAR) 5 MG tablet, Take 1 tablet (5 mg total) by mouth daily., Disp: 90 tablet, Rfl: 3 .  glucose blood (ACCU-CHEK GUIDE) test strip, Use twice daily to check blood sugars Dx E11.9, Disp: 100 each, Rfl: 12 .  lovastatin (MEVACOR) 20 MG tablet, Take 1 tablet (20 mg total) by mouth at bedtime., Disp: 90 tablet, Rfl: 1 .  metFORMIN (GLUCOPHAGE-XR) 500 MG 24 hr tablet, TAKE 1 TABLET BY MOUTH ONCE A DAY WITH  BREAKFAST, Disp: 90 tablet, Rfl: 0 .  mycophenolate (CELLCEPT) 500 MG tablet, Take 2 tablets (1,000 mg total) by mouth 2 (two) times daily., Disp: , Rfl:  .  olmesartan (BENICAR) 40 MG tablet, Take 1 tablet (40 mg total) by mouth daily., Disp: 30 tablet, Rfl: 2 .  tamsulosin (FLOMAX) 0.4 MG CAPS capsule, Take 1 capsule (0.4 mg total) by mouth daily after supper., Disp: 90 capsule, Rfl: 3  EXAM:  VITALS per patient if applicable:  503/88, weight 206 pounds.   GENERAL: alert, oriented, appears well and in no acute distress  HEENT: atraumatic, conjunttiva clear, no obvious abnormalities on inspection of external nose and ears  NECK: normal movements of the head and neck  LUNGS: on inspection no signs of respiratory distress, breathing rate appears normal, no obvious gross SOB, gasping or wheezing  CV: no obvious cyanosis  PSYCH/NEURO: pleasant and cooperative, no obvious depression or anxiety, speech and thought processing grossly intact  ASSESSMENT AND PLAN:  Discussed the following assessment and plan:  Anemia, unspecified type  B12 deficiency  Closed fracture of distal end of right radius, unspecified fracture morphology, sequela  Type 2 diabetes mellitus without complication, without long-term current use of insulin (HCC)  Hip fracture, right, sequela  Hypercholesterolemia  HYPERTENSION, BENIGN  Hyponatremia  Myasthenia gravis (HCC)  Anemia Follow cbc.    B12 deficiency Continue B12 injections.   Closed fracture of right distal radius Wrist splint.  Being followed by ortho.   Diabetes mellitus Has lost weight.  Eating.  Sugars doing well as outlined.  Follow met b and a1c.    Hip fracture, right, sequela S/p right hip fracture.  S/p ORIF.  Home from rehab 6 days ago.  Working with therapy.  No significant pain.  Some soreness.  Follow.  Needs bone density.  Will arrange once COVID restrictions are lifted.    Hypercholesterolemia On lovastatin.  Follow lipid  panel and liver function tests.   HYPERTENSION, BENIGN Blood pressures has been doing well.  Follow pressures.  Follow metabolic panel.   Hyponatremia Off hctz.  Low sodium in hospital.  Sodium on last check (03/02/19) improved - 132.  Continue fluid restriction.  Follow sodium level.    Myasthenia gravis ([place]) On cellcept.  Followed by neurology.  Stable.  Follow cbc.      I discussed the assessment and treatment plan with the patient. The patient was provided an opportunity to ask questions and all were answered. The patient agreed with the plan and demonstrated an understanding of the instructions.   The patient was advised to call back or seek an in-person evaluation if the symptoms worsen or if the condition fails to improve as anticipated.    Einar Pheasant, MD

## 2019-03-15 ENCOUNTER — Encounter: Payer: Self-pay | Admitting: Internal Medicine

## 2019-03-15 NOTE — Assessment & Plan Note (Signed)
Blood pressures has been doing well.  Follow pressures.  Follow metabolic panel.

## 2019-03-15 NOTE — Assessment & Plan Note (Signed)
Has lost weight.  Eating.  Sugars doing well as outlined.  Follow met b and a1c.

## 2019-03-15 NOTE — Assessment & Plan Note (Signed)
On cellcept.  Followed by neurology.  Stable.  Follow cbc.

## 2019-03-15 NOTE — Assessment & Plan Note (Addendum)
S/p right hip fracture.  S/p ORIF.  Home from rehab 6 days ago.  Working with therapy.  No significant pain.  Some soreness.  Follow.  Needs bone density.  Will arrange once COVID restrictions are lifted.

## 2019-03-15 NOTE — Assessment & Plan Note (Signed)
Follow cbc.  

## 2019-03-15 NOTE — Assessment & Plan Note (Signed)
Continue B12 injections.   

## 2019-03-15 NOTE — Assessment & Plan Note (Signed)
On lovastatin.  Follow lipid panel and liver function tests.   

## 2019-03-15 NOTE — Assessment & Plan Note (Signed)
Off hctz.  Low sodium in hospital.  Sodium on last check (03/02/19) improved - 132.  Continue fluid restriction.  Follow sodium level.

## 2019-03-15 NOTE — Assessment & Plan Note (Signed)
Wrist splint.  Being followed by ortho.

## 2019-03-16 ENCOUNTER — Telehealth: Payer: Self-pay

## 2019-03-16 DIAGNOSIS — S72001D Fracture of unspecified part of neck of right femur, subsequent encounter for closed fracture with routine healing: Secondary | ICD-10-CM | POA: Diagnosis not present

## 2019-03-16 DIAGNOSIS — W19XXXD Unspecified fall, subsequent encounter: Secondary | ICD-10-CM | POA: Diagnosis not present

## 2019-03-16 DIAGNOSIS — G7000 Myasthenia gravis without (acute) exacerbation: Secondary | ICD-10-CM | POA: Diagnosis not present

## 2019-03-16 DIAGNOSIS — E119 Type 2 diabetes mellitus without complications: Secondary | ICD-10-CM | POA: Diagnosis not present

## 2019-03-16 DIAGNOSIS — Z8673 Personal history of transient ischemic attack (TIA), and cerebral infarction without residual deficits: Secondary | ICD-10-CM | POA: Diagnosis not present

## 2019-03-16 DIAGNOSIS — Z96641 Presence of right artificial hip joint: Secondary | ICD-10-CM | POA: Diagnosis not present

## 2019-03-16 NOTE — Telephone Encounter (Signed)
Copied from [place] [id number]. Topic: General - Other >> Mar 16, 2019  1:20 PM Mcneil, Ja-Kwan wrote: Reason for CRM: Pt stated he needs to speak with Dr.Scott nurse regarding some labs. Pt requests call back

## 2019-03-17 NOTE — Telephone Encounter (Signed)
Labs are ordered.  He is due other labs as well, so I do want to check sodium, but would like to check the other labs also.  Ok to schedule.  Also, reviewed PTs note.  Please confirm with pt and daughter Golden Circle or daughter-n-law Serena Colonel that pt is doing ok.  Confirm respirations ok and no worsening problems.

## 2019-03-17 NOTE — Telephone Encounter (Signed)
Scheduled pt for labs and b12. Confirmed with pt he was doing okay. No SOB. Tried to call Golden Circle but had to leave message to return my call

## 2019-03-17 NOTE — Telephone Encounter (Signed)
Pt called to let me know that he does not have skilled nursing that comes out to help him so they cannot come out just to draw blood. While on the phone he did mention that he needs a b12 injection and would be willing to come to the office next week for NV. Do you want to have his sodium rechecked at the same time?

## 2019-03-18 DIAGNOSIS — Z96641 Presence of right artificial hip joint: Secondary | ICD-10-CM | POA: Diagnosis not present

## 2019-03-18 DIAGNOSIS — Z8673 Personal history of transient ischemic attack (TIA), and cerebral infarction without residual deficits: Secondary | ICD-10-CM | POA: Diagnosis not present

## 2019-03-18 DIAGNOSIS — W19XXXD Unspecified fall, subsequent encounter: Secondary | ICD-10-CM | POA: Diagnosis not present

## 2019-03-18 DIAGNOSIS — E119 Type 2 diabetes mellitus without complications: Secondary | ICD-10-CM | POA: Diagnosis not present

## 2019-03-18 DIAGNOSIS — H02054 Trichiasis without entropian left upper eyelid: Secondary | ICD-10-CM | POA: Diagnosis not present

## 2019-03-18 DIAGNOSIS — S72001D Fracture of unspecified part of neck of right femur, subsequent encounter for closed fracture with routine healing: Secondary | ICD-10-CM | POA: Diagnosis not present

## 2019-03-18 DIAGNOSIS — G7000 Myasthenia gravis without (acute) exacerbation: Secondary | ICD-10-CM | POA: Diagnosis not present

## 2019-03-23 DIAGNOSIS — W19XXXD Unspecified fall, subsequent encounter: Secondary | ICD-10-CM | POA: Diagnosis not present

## 2019-03-23 DIAGNOSIS — Z8673 Personal history of transient ischemic attack (TIA), and cerebral infarction without residual deficits: Secondary | ICD-10-CM | POA: Diagnosis not present

## 2019-03-23 DIAGNOSIS — E119 Type 2 diabetes mellitus without complications: Secondary | ICD-10-CM | POA: Diagnosis not present

## 2019-03-23 DIAGNOSIS — S72001D Fracture of unspecified part of neck of right femur, subsequent encounter for closed fracture with routine healing: Secondary | ICD-10-CM | POA: Diagnosis not present

## 2019-03-23 DIAGNOSIS — Z96641 Presence of right artificial hip joint: Secondary | ICD-10-CM | POA: Diagnosis not present

## 2019-03-23 DIAGNOSIS — G7000 Myasthenia gravis without (acute) exacerbation: Secondary | ICD-10-CM | POA: Diagnosis not present

## 2019-03-24 DIAGNOSIS — M25551 Pain in right hip: Secondary | ICD-10-CM | POA: Diagnosis not present

## 2019-03-24 DIAGNOSIS — M8580 Other specified disorders of bone density and structure, unspecified site: Secondary | ICD-10-CM | POA: Diagnosis not present

## 2019-03-24 DIAGNOSIS — Z79899 Other long term (current) drug therapy: Secondary | ICD-10-CM | POA: Diagnosis not present

## 2019-03-24 DIAGNOSIS — S72001D Fracture of unspecified part of neck of right femur, subsequent encounter for closed fracture with routine healing: Secondary | ICD-10-CM | POA: Diagnosis not present

## 2019-03-24 DIAGNOSIS — M47816 Spondylosis without myelopathy or radiculopathy, lumbar region: Secondary | ICD-10-CM | POA: Diagnosis not present

## 2019-03-24 DIAGNOSIS — M1612 Unilateral primary osteoarthritis, left hip: Secondary | ICD-10-CM | POA: Diagnosis not present

## 2019-03-24 DIAGNOSIS — Z96641 Presence of right artificial hip joint: Secondary | ICD-10-CM | POA: Diagnosis not present

## 2019-03-24 DIAGNOSIS — M8588 Other specified disorders of bone density and structure, other site: Secondary | ICD-10-CM | POA: Diagnosis not present

## 2019-03-25 ENCOUNTER — Other Ambulatory Visit (INDEPENDENT_AMBULATORY_CARE_PROVIDER_SITE_OTHER): Payer: Medicare Other

## 2019-03-25 ENCOUNTER — Other Ambulatory Visit: Payer: Self-pay

## 2019-03-25 ENCOUNTER — Ambulatory Visit (INDEPENDENT_AMBULATORY_CARE_PROVIDER_SITE_OTHER): Payer: Medicare Other

## 2019-03-25 DIAGNOSIS — E119 Type 2 diabetes mellitus without complications: Secondary | ICD-10-CM

## 2019-03-25 DIAGNOSIS — W19XXXD Unspecified fall, subsequent encounter: Secondary | ICD-10-CM | POA: Diagnosis not present

## 2019-03-25 DIAGNOSIS — D649 Anemia, unspecified: Secondary | ICD-10-CM | POA: Diagnosis not present

## 2019-03-25 DIAGNOSIS — E7800 Pure hypercholesterolemia, unspecified: Secondary | ICD-10-CM

## 2019-03-25 DIAGNOSIS — Z8673 Personal history of transient ischemic attack (TIA), and cerebral infarction without residual deficits: Secondary | ICD-10-CM | POA: Diagnosis not present

## 2019-03-25 DIAGNOSIS — E538 Deficiency of other specified B group vitamins: Secondary | ICD-10-CM | POA: Diagnosis not present

## 2019-03-25 DIAGNOSIS — Z96641 Presence of right artificial hip joint: Secondary | ICD-10-CM | POA: Diagnosis not present

## 2019-03-25 DIAGNOSIS — G7000 Myasthenia gravis without (acute) exacerbation: Secondary | ICD-10-CM | POA: Diagnosis not present

## 2019-03-25 DIAGNOSIS — S72001D Fracture of unspecified part of neck of right femur, subsequent encounter for closed fracture with routine healing: Secondary | ICD-10-CM | POA: Diagnosis not present

## 2019-03-25 LAB — CBC WITH DIFFERENTIAL/PLATELET
Basophils Absolute: 0.1 10*3/uL (ref 0.0–0.1)
Basophils Relative: 0.9 % (ref 0.0–3.0)
Eosinophils Absolute: 0.1 10*3/uL (ref 0.0–0.7)
Eosinophils Relative: 1.8 % (ref 0.0–5.0)
HCT: 36.5 % — ABNORMAL LOW (ref 39.0–52.0)
Hemoglobin: 12.2 g/dL — ABNORMAL LOW (ref 13.0–17.0)
Lymphocytes Relative: 17.1 % (ref 12.0–46.0)
Lymphs Abs: 1.3 10*3/uL (ref 0.7–4.0)
MCHC: 33.4 g/dL (ref 30.0–36.0)
MCV: 89.5 fl (ref 78.0–100.0)
Monocytes Absolute: 0.5 10*3/uL (ref 0.1–1.0)
Monocytes Relative: 6.9 % (ref 3.0–12.0)
Neutro Abs: 5.7 10*3/uL (ref 1.4–7.7)
Neutrophils Relative %: 73.3 % (ref 43.0–77.0)
Platelets: 373 10*3/uL (ref 150.0–400.0)
RBC: 4.08 Mil/uL — ABNORMAL LOW (ref 4.22–5.81)
RDW: 13.8 % (ref 11.5–15.5)
WBC: 7.8 10*3/uL (ref 4.0–10.5)

## 2019-03-25 LAB — LIPID PANEL
Cholesterol: 117 mg/dL (ref 0–200)
HDL: 50.6 mg/dL (ref >39.00)
LDL Cholesterol: 47 mg/dL (ref 0–99)
NonHDL: 66.34
Total CHOL/HDL Ratio: 2
Triglycerides: 99 mg/dL (ref 0.0–149.0)
VLDL: 19.8 mg/dL (ref 0.0–40.0)

## 2019-03-25 LAB — BASIC METABOLIC PANEL
BUN: 19 mg/dL (ref 6–23)
CO2: 30 mEq/L (ref 19–32)
Calcium: 9.6 mg/dL (ref 8.4–10.5)
Chloride: 100 mEq/L (ref 96–112)
Creatinine, Ser: 0.72 mg/dL (ref 0.40–1.50)
GFR: 102.45 mL/min (ref >60.00)
Glucose, Bld: 149 mg/dL — ABNORMAL HIGH (ref 70–99)
Potassium: 5.2 mEq/L — ABNORMAL HIGH (ref 3.5–5.1)
Sodium: 137 mEq/L (ref 135–145)

## 2019-03-25 LAB — HEPATIC FUNCTION PANEL
ALT: 11 U/L (ref 0–53)
AST: 13 U/L (ref 0–37)
Albumin: 3.9 g/dL (ref 3.5–5.2)
Alkaline Phosphatase: 55 U/L (ref 39–117)
Bilirubin, Direct: 0.2 mg/dL (ref 0.0–0.3)
Total Bilirubin: 0.8 mg/dL (ref 0.2–1.2)
Total Protein: 7 g/dL (ref 6.0–8.3)

## 2019-03-25 LAB — HEMOGLOBIN A1C: Hgb A1c MFr Bld: 6.7 % — ABNORMAL HIGH (ref 4.6–6.5)

## 2019-03-25 MED ORDER — CYANOCOBALAMIN 1000 MCG/ML IJ SOLN
1000.0000 ug | Freq: Once | INTRAMUSCULAR | Status: AC
  Administered 2019-03-25: 1000 ug via INTRAMUSCULAR

## 2019-03-25 NOTE — Progress Notes (Addendum)
Patient presented today for B12 injection.  Administered IM in left deltoid.  Patient tolerated well.  Pt voiced no concerns nor showed any signs of distress.  Reviewed.  Dr Nicki Reaper

## 2019-03-26 ENCOUNTER — Other Ambulatory Visit: Payer: Self-pay | Admitting: Internal Medicine

## 2019-03-26 DIAGNOSIS — E875 Hyperkalemia: Secondary | ICD-10-CM

## 2019-03-26 NOTE — Progress Notes (Signed)
Order placed for f/u stat potassium.  

## 2019-03-27 ENCOUNTER — Other Ambulatory Visit: Payer: Self-pay

## 2019-03-27 ENCOUNTER — Other Ambulatory Visit (INDEPENDENT_AMBULATORY_CARE_PROVIDER_SITE_OTHER): Payer: Medicare Other

## 2019-03-27 DIAGNOSIS — E875 Hyperkalemia: Secondary | ICD-10-CM

## 2019-03-27 LAB — POTASSIUM: Potassium: 4.5 mEq/L (ref 3.5–5.1)

## 2019-03-30 ENCOUNTER — Encounter: Payer: Self-pay | Admitting: Internal Medicine

## 2019-03-30 DIAGNOSIS — E871 Hypo-osmolality and hyponatremia: Secondary | ICD-10-CM

## 2019-03-31 DIAGNOSIS — W19XXXD Unspecified fall, subsequent encounter: Secondary | ICD-10-CM | POA: Diagnosis not present

## 2019-03-31 DIAGNOSIS — Z96641 Presence of right artificial hip joint: Secondary | ICD-10-CM | POA: Diagnosis not present

## 2019-03-31 DIAGNOSIS — Z8673 Personal history of transient ischemic attack (TIA), and cerebral infarction without residual deficits: Secondary | ICD-10-CM | POA: Diagnosis not present

## 2019-03-31 DIAGNOSIS — S72001D Fracture of unspecified part of neck of right femur, subsequent encounter for closed fracture with routine healing: Secondary | ICD-10-CM | POA: Diagnosis not present

## 2019-03-31 DIAGNOSIS — G7000 Myasthenia gravis without (acute) exacerbation: Secondary | ICD-10-CM | POA: Diagnosis not present

## 2019-03-31 DIAGNOSIS — E119 Type 2 diabetes mellitus without complications: Secondary | ICD-10-CM | POA: Diagnosis not present

## 2019-04-02 DIAGNOSIS — E119 Type 2 diabetes mellitus without complications: Secondary | ICD-10-CM | POA: Diagnosis not present

## 2019-04-02 DIAGNOSIS — Z96641 Presence of right artificial hip joint: Secondary | ICD-10-CM | POA: Diagnosis not present

## 2019-04-02 DIAGNOSIS — W19XXXD Unspecified fall, subsequent encounter: Secondary | ICD-10-CM | POA: Diagnosis not present

## 2019-04-02 DIAGNOSIS — G7000 Myasthenia gravis without (acute) exacerbation: Secondary | ICD-10-CM | POA: Diagnosis not present

## 2019-04-02 DIAGNOSIS — S72001D Fracture of unspecified part of neck of right femur, subsequent encounter for closed fracture with routine healing: Secondary | ICD-10-CM | POA: Diagnosis not present

## 2019-04-02 DIAGNOSIS — Z8673 Personal history of transient ischemic attack (TIA), and cerebral infarction without residual deficits: Secondary | ICD-10-CM | POA: Diagnosis not present

## 2019-04-03 NOTE — Telephone Encounter (Signed)
Patient would like Larena Glassman to call him  [phone]

## 2019-04-03 NOTE — Telephone Encounter (Signed)
Spoke with patient. Stated he was not having issues with constipation. Confirmed no pain, no acute symptoms. Noted that he does not go as regular as he was prior to surgery. Bowels are still moving daily with no issues, bowel movements are not as large. Pt is taking a stool softener. Pt asked about fluid restriction. Advised per Dr. Nicki Reaper she would like for him to continue until we recheck sodium.Patient is okay with this and has been scheduled to have nonfasting labs on 6/23 when he comes in for b12 injection per pt request.

## 2019-04-03 NOTE — Telephone Encounter (Signed)
Order placed for f/u stat met b 

## 2019-04-06 DIAGNOSIS — S72001D Fracture of unspecified part of neck of right femur, subsequent encounter for closed fracture with routine healing: Secondary | ICD-10-CM | POA: Diagnosis not present

## 2019-04-06 DIAGNOSIS — E119 Type 2 diabetes mellitus without complications: Secondary | ICD-10-CM | POA: Diagnosis not present

## 2019-04-06 DIAGNOSIS — Z96641 Presence of right artificial hip joint: Secondary | ICD-10-CM | POA: Diagnosis not present

## 2019-04-06 DIAGNOSIS — G7000 Myasthenia gravis without (acute) exacerbation: Secondary | ICD-10-CM | POA: Diagnosis not present

## 2019-04-06 DIAGNOSIS — Z8673 Personal history of transient ischemic attack (TIA), and cerebral infarction without residual deficits: Secondary | ICD-10-CM | POA: Diagnosis not present

## 2019-04-06 DIAGNOSIS — W19XXXD Unspecified fall, subsequent encounter: Secondary | ICD-10-CM | POA: Diagnosis not present

## 2019-04-06 NOTE — Telephone Encounter (Signed)
Yes, this has been addressed with him and he was scheduled for lab work. He is going to let me know if problems.

## 2019-04-06 NOTE — Telephone Encounter (Signed)
I think this has already been addressed.  Let me know if any other questions.

## 2019-04-07 DIAGNOSIS — Z7982 Long term (current) use of aspirin: Secondary | ICD-10-CM | POA: Diagnosis not present

## 2019-04-07 DIAGNOSIS — E119 Type 2 diabetes mellitus without complications: Secondary | ICD-10-CM | POA: Diagnosis not present

## 2019-04-07 DIAGNOSIS — Z7984 Long term (current) use of oral hypoglycemic drugs: Secondary | ICD-10-CM | POA: Diagnosis not present

## 2019-04-07 DIAGNOSIS — Z96641 Presence of right artificial hip joint: Secondary | ICD-10-CM | POA: Diagnosis not present

## 2019-04-07 DIAGNOSIS — S72001D Fracture of unspecified part of neck of right femur, subsequent encounter for closed fracture with routine healing: Secondary | ICD-10-CM | POA: Diagnosis not present

## 2019-04-07 DIAGNOSIS — G7000 Myasthenia gravis without (acute) exacerbation: Secondary | ICD-10-CM | POA: Diagnosis not present

## 2019-04-07 DIAGNOSIS — Z7901 Long term (current) use of anticoagulants: Secondary | ICD-10-CM | POA: Diagnosis not present

## 2019-04-07 DIAGNOSIS — Z8673 Personal history of transient ischemic attack (TIA), and cerebral infarction without residual deficits: Secondary | ICD-10-CM | POA: Diagnosis not present

## 2019-04-07 DIAGNOSIS — W19XXXD Unspecified fall, subsequent encounter: Secondary | ICD-10-CM | POA: Diagnosis not present

## 2019-04-10 NOTE — Telephone Encounter (Signed)
This has been addressed. Pt is scheduled for labs and b12 on 6/23

## 2019-04-10 NOTE — Telephone Encounter (Signed)
Please confirm this has already been addressed.

## 2019-04-15 DIAGNOSIS — E119 Type 2 diabetes mellitus without complications: Secondary | ICD-10-CM | POA: Diagnosis not present

## 2019-04-15 DIAGNOSIS — Z96641 Presence of right artificial hip joint: Secondary | ICD-10-CM | POA: Diagnosis not present

## 2019-04-15 DIAGNOSIS — W19XXXD Unspecified fall, subsequent encounter: Secondary | ICD-10-CM | POA: Diagnosis not present

## 2019-04-15 DIAGNOSIS — Z8673 Personal history of transient ischemic attack (TIA), and cerebral infarction without residual deficits: Secondary | ICD-10-CM | POA: Diagnosis not present

## 2019-04-15 DIAGNOSIS — G7000 Myasthenia gravis without (acute) exacerbation: Secondary | ICD-10-CM | POA: Diagnosis not present

## 2019-04-15 DIAGNOSIS — S72001D Fracture of unspecified part of neck of right femur, subsequent encounter for closed fracture with routine healing: Secondary | ICD-10-CM | POA: Diagnosis not present

## 2019-04-20 ENCOUNTER — Telehealth: Payer: Self-pay | Admitting: Internal Medicine

## 2019-04-20 DIAGNOSIS — Z8673 Personal history of transient ischemic attack (TIA), and cerebral infarction without residual deficits: Secondary | ICD-10-CM | POA: Diagnosis not present

## 2019-04-20 DIAGNOSIS — Z96641 Presence of right artificial hip joint: Secondary | ICD-10-CM | POA: Diagnosis not present

## 2019-04-20 DIAGNOSIS — E119 Type 2 diabetes mellitus without complications: Secondary | ICD-10-CM | POA: Diagnosis not present

## 2019-04-20 DIAGNOSIS — W19XXXD Unspecified fall, subsequent encounter: Secondary | ICD-10-CM | POA: Diagnosis not present

## 2019-04-20 DIAGNOSIS — G7000 Myasthenia gravis without (acute) exacerbation: Secondary | ICD-10-CM | POA: Diagnosis not present

## 2019-04-20 DIAGNOSIS — S72001D Fracture of unspecified part of neck of right femur, subsequent encounter for closed fracture with routine healing: Secondary | ICD-10-CM | POA: Diagnosis not present

## 2019-04-20 NOTE — Telephone Encounter (Signed)
Copied from [place] [id number]. Topic: Quick Communication - Rx Refill/Question >> Apr 20, 2019  2:30 PM Waylan Rocher, Lumin L wrote: Medication: amLODipine (NORVASC) 5 MG tablet   Has the patient contacted their pharmacy? yes  (Agent: If no, request that the patient contact the pharmacy for the refill.) (Agent: If yes, when and what did the pharmacy advise?)  Preferred Pharmacy (with phone number or street name): [id number] [address], [place] - [address] [place] [address] Phone: [phone] Fax: [phone]  Agent: Please be advised that RX refills may take up to 3 business days. We ask that you follow-up with your pharmacy.

## 2019-04-21 ENCOUNTER — Other Ambulatory Visit: Payer: Self-pay

## 2019-04-21 ENCOUNTER — Other Ambulatory Visit: Payer: Medicare Other

## 2019-04-21 ENCOUNTER — Other Ambulatory Visit (INDEPENDENT_AMBULATORY_CARE_PROVIDER_SITE_OTHER): Payer: Medicare Other

## 2019-04-21 ENCOUNTER — Ambulatory Visit (INDEPENDENT_AMBULATORY_CARE_PROVIDER_SITE_OTHER): Payer: Medicare Other

## 2019-04-21 DIAGNOSIS — E871 Hypo-osmolality and hyponatremia: Secondary | ICD-10-CM

## 2019-04-21 DIAGNOSIS — E538 Deficiency of other specified B group vitamins: Secondary | ICD-10-CM | POA: Diagnosis not present

## 2019-04-21 LAB — BASIC METABOLIC PANEL
BUN: 15 mg/dL (ref 6–23)
CO2: 28 mEq/L (ref 19–32)
Calcium: 9.7 mg/dL (ref 8.4–10.5)
Chloride: 100 mEq/L (ref 96–112)
Creatinine, Ser: 0.7 mg/dL (ref 0.40–1.50)
GFR: 105.81 mL/min (ref >60.00)
Glucose, Bld: 153 mg/dL — ABNORMAL HIGH (ref 70–99)
Potassium: 5 mEq/L (ref 3.5–5.1)
Sodium: 137 mEq/L (ref 135–145)

## 2019-04-21 MED ORDER — AMLODIPINE BESYLATE 5 MG PO TABS: 5.0000 mg | ORAL_TABLET | Freq: Every day | ORAL | 3 refills | Status: DC

## 2019-04-21 MED ORDER — CYANOCOBALAMIN 1000 MCG/ML IJ SOLN
1000.0000 ug | Freq: Once | INTRAMUSCULAR | Status: AC
  Administered 2019-04-21: 1000 ug via INTRAMUSCULAR

## 2019-04-21 NOTE — Telephone Encounter (Signed)
rx sent in 

## 2019-04-21 NOTE — Progress Notes (Addendum)
[name] presents today for injection per MD orders. B12 injection administered IM in right Upper Arm. Administration without incident. Patient tolerated well.  Nina,cma  Reviewed.  Dr Nicki Reaper

## 2019-04-27 DIAGNOSIS — W19XXXD Unspecified fall, subsequent encounter: Secondary | ICD-10-CM | POA: Diagnosis not present

## 2019-04-27 DIAGNOSIS — S72001D Fracture of unspecified part of neck of right femur, subsequent encounter for closed fracture with routine healing: Secondary | ICD-10-CM | POA: Diagnosis not present

## 2019-04-27 DIAGNOSIS — E119 Type 2 diabetes mellitus without complications: Secondary | ICD-10-CM | POA: Diagnosis not present

## 2019-04-27 DIAGNOSIS — Z8673 Personal history of transient ischemic attack (TIA), and cerebral infarction without residual deficits: Secondary | ICD-10-CM | POA: Diagnosis not present

## 2019-04-27 DIAGNOSIS — Z96641 Presence of right artificial hip joint: Secondary | ICD-10-CM | POA: Diagnosis not present

## 2019-04-27 DIAGNOSIS — G7000 Myasthenia gravis without (acute) exacerbation: Secondary | ICD-10-CM | POA: Diagnosis not present

## 2019-04-28 ENCOUNTER — Ambulatory Visit: Payer: Medicare Other

## 2019-04-28 ENCOUNTER — Other Ambulatory Visit: Payer: Medicare Other

## 2019-05-04 DIAGNOSIS — Y92513 Shop (commercial) as the place of occurrence of the external cause: Secondary | ICD-10-CM | POA: Diagnosis not present

## 2019-05-04 DIAGNOSIS — M47816 Spondylosis without myelopathy or radiculopathy, lumbar region: Secondary | ICD-10-CM | POA: Diagnosis not present

## 2019-05-04 DIAGNOSIS — M85821 Other specified disorders of bone density and structure, right upper arm: Secondary | ICD-10-CM | POA: Diagnosis not present

## 2019-05-04 DIAGNOSIS — Z96641 Presence of right artificial hip joint: Secondary | ICD-10-CM | POA: Diagnosis not present

## 2019-05-04 DIAGNOSIS — S52514A Nondisplaced fracture of right radial styloid process, initial encounter for closed fracture: Secondary | ICD-10-CM | POA: Diagnosis not present

## 2019-05-04 DIAGNOSIS — W1839XA Other fall on same level, initial encounter: Secondary | ICD-10-CM | POA: Diagnosis not present

## 2019-05-04 DIAGNOSIS — M8588 Other specified disorders of bone density and structure, other site: Secondary | ICD-10-CM | POA: Diagnosis not present

## 2019-05-04 DIAGNOSIS — Y990 Civilian activity done for income or pay: Secondary | ICD-10-CM | POA: Diagnosis not present

## 2019-05-04 DIAGNOSIS — Z471 Aftercare following joint replacement surgery: Secondary | ICD-10-CM | POA: Diagnosis not present

## 2019-05-04 DIAGNOSIS — M25831 Other specified joint disorders, right wrist: Secondary | ICD-10-CM | POA: Diagnosis not present

## 2019-05-04 DIAGNOSIS — S52611D Displaced fracture of right ulna styloid process, subsequent encounter for closed fracture with routine healing: Secondary | ICD-10-CM | POA: Diagnosis not present

## 2019-05-04 DIAGNOSIS — S52514D Nondisplaced fracture of right radial styloid process, subsequent encounter for closed fracture with routine healing: Secondary | ICD-10-CM | POA: Diagnosis not present

## 2019-05-04 DIAGNOSIS — M25852 Other specified joint disorders, left hip: Secondary | ICD-10-CM | POA: Diagnosis not present

## 2019-05-08 DIAGNOSIS — M25551 Pain in right hip: Secondary | ICD-10-CM | POA: Diagnosis not present

## 2019-05-08 DIAGNOSIS — M25651 Stiffness of right hip, not elsewhere classified: Secondary | ICD-10-CM | POA: Diagnosis not present

## 2019-05-08 DIAGNOSIS — R262 Difficulty in walking, not elsewhere classified: Secondary | ICD-10-CM | POA: Diagnosis not present

## 2019-05-08 DIAGNOSIS — R278 Other lack of coordination: Secondary | ICD-10-CM | POA: Diagnosis not present

## 2019-05-11 DIAGNOSIS — G7000 Myasthenia gravis without (acute) exacerbation: Secondary | ICD-10-CM | POA: Diagnosis not present

## 2019-05-12 DIAGNOSIS — M25631 Stiffness of right wrist, not elsewhere classified: Secondary | ICD-10-CM | POA: Diagnosis not present

## 2019-05-12 DIAGNOSIS — M25531 Pain in right wrist: Secondary | ICD-10-CM | POA: Diagnosis not present

## 2019-05-12 DIAGNOSIS — R262 Difficulty in walking, not elsewhere classified: Secondary | ICD-10-CM | POA: Diagnosis not present

## 2019-05-12 DIAGNOSIS — R278 Other lack of coordination: Secondary | ICD-10-CM | POA: Diagnosis not present

## 2019-05-12 DIAGNOSIS — M25651 Stiffness of right hip, not elsewhere classified: Secondary | ICD-10-CM | POA: Diagnosis not present

## 2019-05-12 DIAGNOSIS — M25551 Pain in right hip: Secondary | ICD-10-CM | POA: Diagnosis not present

## 2019-05-15 DIAGNOSIS — R262 Difficulty in walking, not elsewhere classified: Secondary | ICD-10-CM | POA: Diagnosis not present

## 2019-05-15 DIAGNOSIS — M25651 Stiffness of right hip, not elsewhere classified: Secondary | ICD-10-CM | POA: Diagnosis not present

## 2019-05-15 DIAGNOSIS — R278 Other lack of coordination: Secondary | ICD-10-CM | POA: Diagnosis not present

## 2019-05-15 DIAGNOSIS — M25551 Pain in right hip: Secondary | ICD-10-CM | POA: Diagnosis not present

## 2019-05-18 DIAGNOSIS — M25651 Stiffness of right hip, not elsewhere classified: Secondary | ICD-10-CM | POA: Diagnosis not present

## 2019-05-18 DIAGNOSIS — R278 Other lack of coordination: Secondary | ICD-10-CM | POA: Diagnosis not present

## 2019-05-18 DIAGNOSIS — M25551 Pain in right hip: Secondary | ICD-10-CM | POA: Diagnosis not present

## 2019-05-18 DIAGNOSIS — R262 Difficulty in walking, not elsewhere classified: Secondary | ICD-10-CM | POA: Diagnosis not present

## 2019-05-19 DIAGNOSIS — R262 Difficulty in walking, not elsewhere classified: Secondary | ICD-10-CM | POA: Diagnosis not present

## 2019-05-19 DIAGNOSIS — M25651 Stiffness of right hip, not elsewhere classified: Secondary | ICD-10-CM | POA: Diagnosis not present

## 2019-05-19 DIAGNOSIS — M25551 Pain in right hip: Secondary | ICD-10-CM | POA: Diagnosis not present

## 2019-05-19 DIAGNOSIS — R278 Other lack of coordination: Secondary | ICD-10-CM | POA: Diagnosis not present

## 2019-05-22 DIAGNOSIS — G5601 Carpal tunnel syndrome, right upper limb: Secondary | ICD-10-CM | POA: Insufficient documentation

## 2019-05-22 DIAGNOSIS — R278 Other lack of coordination: Secondary | ICD-10-CM | POA: Diagnosis not present

## 2019-05-22 DIAGNOSIS — M25551 Pain in right hip: Secondary | ICD-10-CM | POA: Diagnosis not present

## 2019-05-22 DIAGNOSIS — R262 Difficulty in walking, not elsewhere classified: Secondary | ICD-10-CM | POA: Diagnosis not present

## 2019-05-22 DIAGNOSIS — S52501D Unspecified fracture of the lower end of right radius, subsequent encounter for closed fracture with routine healing: Secondary | ICD-10-CM | POA: Diagnosis not present

## 2019-05-22 DIAGNOSIS — M25651 Stiffness of right hip, not elsewhere classified: Secondary | ICD-10-CM | POA: Diagnosis not present

## 2019-05-26 ENCOUNTER — Ambulatory Visit (INDEPENDENT_AMBULATORY_CARE_PROVIDER_SITE_OTHER): Payer: Medicare Other

## 2019-05-26 ENCOUNTER — Other Ambulatory Visit: Payer: Self-pay

## 2019-05-26 DIAGNOSIS — R278 Other lack of coordination: Secondary | ICD-10-CM | POA: Diagnosis not present

## 2019-05-26 DIAGNOSIS — E538 Deficiency of other specified B group vitamins: Secondary | ICD-10-CM

## 2019-05-26 DIAGNOSIS — R262 Difficulty in walking, not elsewhere classified: Secondary | ICD-10-CM | POA: Diagnosis not present

## 2019-05-26 DIAGNOSIS — M25551 Pain in right hip: Secondary | ICD-10-CM | POA: Diagnosis not present

## 2019-05-26 DIAGNOSIS — M25651 Stiffness of right hip, not elsewhere classified: Secondary | ICD-10-CM | POA: Diagnosis not present

## 2019-05-26 MED ORDER — CYANOCOBALAMIN 1000 MCG/ML IJ SOLN
1000.0000 ug | Freq: Once | INTRAMUSCULAR | Status: AC
  Administered 2019-05-26: 1000 ug via INTRAMUSCULAR

## 2019-05-26 NOTE — Progress Notes (Addendum)
Patient presented today for B12 injection.  Administered IM in left deltoid.  Patient tolerated well with no signs of distress.  Reviewed.  Dr Scott  

## 2019-05-28 DIAGNOSIS — M25551 Pain in right hip: Secondary | ICD-10-CM | POA: Diagnosis not present

## 2019-05-28 DIAGNOSIS — R262 Difficulty in walking, not elsewhere classified: Secondary | ICD-10-CM | POA: Diagnosis not present

## 2019-05-28 DIAGNOSIS — M25631 Stiffness of right wrist, not elsewhere classified: Secondary | ICD-10-CM | POA: Diagnosis not present

## 2019-05-28 DIAGNOSIS — R278 Other lack of coordination: Secondary | ICD-10-CM | POA: Diagnosis not present

## 2019-05-28 DIAGNOSIS — M25651 Stiffness of right hip, not elsewhere classified: Secondary | ICD-10-CM | POA: Diagnosis not present

## 2019-05-28 DIAGNOSIS — M25531 Pain in right wrist: Secondary | ICD-10-CM | POA: Diagnosis not present

## 2019-06-01 ENCOUNTER — Telehealth: Payer: Self-pay | Admitting: Internal Medicine

## 2019-06-01 DIAGNOSIS — M25472 Effusion, left ankle: Secondary | ICD-10-CM | POA: Diagnosis not present

## 2019-06-01 DIAGNOSIS — M25471 Effusion, right ankle: Secondary | ICD-10-CM | POA: Diagnosis not present

## 2019-06-01 DIAGNOSIS — M25474 Effusion, right foot: Secondary | ICD-10-CM | POA: Diagnosis not present

## 2019-06-01 DIAGNOSIS — M25475 Effusion, left foot: Secondary | ICD-10-CM | POA: Diagnosis not present

## 2019-06-01 NOTE — Telephone Encounter (Signed)
Redness?  Pain?  If acute swelling - one foot needs to be seen.

## 2019-06-01 NOTE — Telephone Encounter (Signed)
Pt confirmed no pain, no warmth, no redness. Has been like this for a few days but also stated he has not seen his foot this big. Right foot is significantly more swollen than the left. Patient is going to urgent care to have his foot looked at.

## 2019-06-01 NOTE — Telephone Encounter (Signed)
Pt states that him and PCP have been monitoring closely the swelling in his feet. Pt states that his right foot is swollen very bad right now (the biggest its ever been).  Pt has phone AWV tomorrow 7.28.20 at 1230pm with [name].  Please advise

## 2019-06-02 ENCOUNTER — Ambulatory Visit (INDEPENDENT_AMBULATORY_CARE_PROVIDER_SITE_OTHER): Payer: Medicare Other

## 2019-06-02 DIAGNOSIS — M25531 Pain in right wrist: Secondary | ICD-10-CM | POA: Diagnosis not present

## 2019-06-02 DIAGNOSIS — R262 Difficulty in walking, not elsewhere classified: Secondary | ICD-10-CM | POA: Diagnosis not present

## 2019-06-02 DIAGNOSIS — M25551 Pain in right hip: Secondary | ICD-10-CM | POA: Diagnosis not present

## 2019-06-02 DIAGNOSIS — M25651 Stiffness of right hip, not elsewhere classified: Secondary | ICD-10-CM | POA: Diagnosis not present

## 2019-06-02 DIAGNOSIS — Z0000 Encounter for general adult medical examination without abnormal findings: Secondary | ICD-10-CM

## 2019-06-02 DIAGNOSIS — M25631 Stiffness of right wrist, not elsewhere classified: Secondary | ICD-10-CM | POA: Diagnosis not present

## 2019-06-02 DIAGNOSIS — R278 Other lack of coordination: Secondary | ICD-10-CM | POA: Diagnosis not present

## 2019-06-02 NOTE — Patient Instructions (Addendum)
  Mr. [name] , Thank you for taking time to come for your Medicare Wellness Visit. I appreciate your ongoing commitment to your health goals. Please review the following plan we discussed and let me know if I can assist you in the future.   These are the goals we discussed: Goals    . Increase physical activity     Balance exercises       This is a list of the screening recommended for you and due dates:  Health Maintenance  Topic Date Due  . Tetanus Vaccine  06/01/2020*  . Flu Shot  06/06/2019  . Complete foot exam   08/08/2019  . Eye exam for diabetics  09/04/2019  . Hemoglobin A1C  09/25/2019  . Pneumonia vaccines  Discontinued  *Topic was postponed. The date shown is not the original due date.

## 2019-06-02 NOTE — Progress Notes (Signed)
Subjective:   [name] is a 85 y.o. male who presents for an Initial Medicare Annual Wellness Visit.  Review of Systems  No ROS.  Medicare Wellness Virtual Visit.  Visual/audio telehealth visit, UTA vital signs.   See social history for additional risk factors.   Cardiac Risk Factors include: advanced age (>23men, >63 women);hypertension;diabetes mellitus;male gender    Objective:    Today's Vitals   There is no height or weight on file to calculate BMI.  Advanced Directives 06/02/2019 11/01/2016 09/16/2016 09/13/2015 09/12/2015  Does Patient Have a Medical Advance Directive? Yes Yes Yes Yes Yes  Type of Paramedic of [place];Living will [place];Living will Living will [place];Living will Living will;Healthcare Power of Attorney  Does patient want to make changes to medical advance directive? No - Patient declined - - No - Patient declined -  Copy of [place] in Chart? No - copy requested - - No - copy requested -    Current Medications (verified) Outpatient Encounter Medications as of 06/02/2019  Medication Sig  . ACCU-CHEK FASTCLIX LANCETS MISC Use daily as directed to check sugars twice daily. DX E11.9  . amLODipine (NORVASC) 5 MG tablet Take 1 tablet (5 mg total) by mouth daily.  Marland Kitchen aspirin (ASPIR-LOW) 81 MG EC tablet Take 81 mg by mouth daily.    . calcium-vitamin D (OSCAL 500/200 D-3) 500-200 MG-UNIT per tablet Take 1 tablet by mouth 2 (two) times daily.   . cyanocobalamin (,VITAMIN B-12,) 1000 MCG/ML injection Inject 1,000 mcg into the muscle every 30 (thirty) days.  Marland Kitchen docusate sodium (COLACE) 100 MG capsule Take by mouth.  . finasteride (PROSCAR) 5 MG tablet Take 1 tablet (5 mg total) by mouth daily.  Marland Kitchen glucose blood (ACCU-CHEK GUIDE) test strip Use twice daily to check blood sugars Dx E11.9  . lovastatin (MEVACOR) 20 MG tablet Take 1 tablet (20 mg total) by mouth at bedtime.   . metFORMIN (GLUCOPHAGE-XR) 500 MG 24 hr tablet TAKE 1 TABLET BY MOUTH ONCE A DAY WITH BREAKFAST  . mycophenolate (CELLCEPT) 500 MG tablet Take 2 tablets (1,000 mg total) by mouth 2 (two) times daily.  Marland Kitchen olmesartan (BENICAR) 40 MG tablet Take 1 tablet (40 mg total) by mouth daily.  . tamsulosin (FLOMAX) 0.4 MG CAPS capsule Take 1 capsule (0.4 mg total) by mouth daily after supper.   No facility-administered encounter medications on file as of 06/02/2019.     Allergies (verified) Magnesium and Penicillamine   History: Past Medical History:  Diagnosis Date  . Allergic rhinitis   . BPH (benign prostatic hypertrophy)   . Degenerative arthritis   . Diabetes mellitus ([name])   . GERD (gastroesophageal reflux disease)   . HTN (hypertension)   . Hypercholesterolemia   . HYPERTENSION, BENIGN 08/15/2010   Qualifier: Diagnosis of  By: Rockey Situ MD, Tim    . Iron deficiency anemia   . Skin cancer   . TIA (transient ischemic attack) 09/13/2015  . Urinary outflow obstruction    mild   Past Surgical History:  Procedure Laterality Date  . PILONIDAL CYST EXCISION  1951   removal  . [place]  . SKIN CANCER EXCISION     multiple  . SQUAMOUS CELL CARCINOMA EXCISION     behind head  . TONSILLECTOMY AND ADENOIDECTOMY  1938   Family History  Problem Relation Age of Onset  . Heart disease Father  heart atack  . Alzheimer's disease Mother   . CVA Brother    Social History   Socioeconomic History  . Marital status: Married    Spouse name: Not on file  . Number of children: Not on file  . Years of education: Not on file  . Highest education level: Not on file  Occupational History  . Not on file  Social Needs  . Financial resource strain: Not hard at all  . Food insecurity    Worry: Never true    Inability: Never true  . Transportation needs    Medical: No    Non-medical: No  Tobacco Use  . Smoking status: Never Smoker  . Smokeless tobacco: Never Used   Substance and Sexual Activity  . Alcohol use: No    Alcohol/week: 0.0 standard drinks  . Drug use: No  . Sexual activity: Not on file  Lifestyle  . Physical activity    Days per week: 2 days    Minutes per session: 60 min  . Stress: Not at all  Relationships  . Social Herbalist on phone: Not on file    Gets together: Not on file    Attends religious service: Not on file    Active member of club or organization: Not on file    Attends meetings of clubs or organizations: Not on file    Relationship status: Not on file  Other Topics Concern  . Not on file  Social History Narrative   Retired, married. Walks everyday         Tobacco Counseling Counseling given: Not Answered   Clinical Intake:  Pre-visit preparation completed: Yes        Diabetes: Yes  How often do you need to have someone help you when you read instructions, pamphlets, or other written materials from your doctor or pharmacy?: 1 - Never  Interpreter Needed?: No     Activities of Daily Living In your present state of health, do you have any difficulty performing the following activities: 06/02/2019  Hearing? N  Vision? N  Difficulty concentrating or making decisions? N  Walking or climbing stairs? Y  Comment Unsteady gait  Dressing or bathing? N  Doing errands, shopping? N  Preparing Food and eating ? Y  Comment He does not cook. Family/friends supply. He self feeds.  Using the Toilet? N  In the past six months, have you accidently leaked urine? N  Do you have problems with loss of bowel control? N  Managing your Medications? N  Managing your Finances? Y  Comment Family assist as needed  Housekeeping or managing your Housekeeping? Y  Comment Maid assist  Some recent data might be hidden     Immunizations and Health Maintenance Immunization History  Administered Date(s) Administered  . Influenza Split 07/21/2012, 07/15/2013  . Influenza, High Dose Seasonal PF 08/13/2018  .  Influenza,inj,Quad PF,6+ Mos 08/09/2014, 07/19/2015  . Influenza-Unspecified 07/21/2012, 07/15/2013, 07/19/2015, 07/20/2016, 08/07/2017  . Pneumococcal Conjugate-13 01/18/2014  . Pneumococcal Polysaccharide-23 10/03/2017   There are no preventive care reminders to display for this patient.  Patient Care Team: Einar Pheasant, MD as PCP - General (Unknown Physician Specialty) Minna Merritts, MD as Consulting Physician (Cardiology)  Indicate any recent Medical Services you may have received from other than [facility] providers in the past year (date may be approximate).    Assessment:   This is a routine wellness examination for [facility].  I connected with patient 06/02/19 at 12:30 PM  EDT by an audio enabled telemedicine application and verified that I am speaking with the correct person using two identifiers. Patient stated full name and DOB. Patient gave permission to continue with virtual visit. Patient's location was at home and Nurse's location was at [place] office.   Health Screenings  Glaucoma -none Hearing -demonstrates normal hearing during visit. Hemoglobin A1C - 03/2019 (6.7) Cholesterol - 03/2019 Dental- UTD Vision- visits within the last 12 months. Wears glasses.   Social  Alcohol intake - no        Smoking history- never    Smokers in home? none Illicit drug use? none Exercise - physical therapy, balance exercises, twice weekly 60 minutes Diet - regular; 3 meals daily Sexually Active -not currently BMI- discussed the importance of a healthy diet, water intake and the benefits of aerobic exercise.  Educational material provided.   Safety  Patient feels safe at home- yes Patient does have smoke detectors at home- yes Patient does wear sunscreen or protective clothing when in direct sunlight -yes Patient does wear seat belt when in a moving vehicle -yes Patient drives- yes  [id number]-[id number] precautions and sickness symptoms discussed.   Activities of Daily Living Patient  denies needing assistance with: driving, feeding themselves, getting from bed to chair, getting to the toilet, bathing/showering and dressing.   Depression Screen Patient denies losing interest in daily life, feeling hopeless, or crying easily over simple problems.   Medication-taking as directed and without issues.   Fall Screen Patient denies being afraid of falling. No falls in about 4 months ago. Cane in use when ambulating. Followed by pcp.    Memory Screen Patient is alert.  Patient denies difficulty focusing, concentrating or misplacing items. Correctly identified the president of the [place], season and recall. Patient likes to read the newspaper for brain stimulation. Family and friends assist with meal preparation, house hold chores and money management as needed.   Immunizations The following Immunizations were discussed: Influenza, shingles, pneumonia, and tetanus.   Other Providers Patient Care Team: Einar Pheasant, MD as PCP - General (Unknown Physician Specialty) Minna Merritts, MD as Consulting Physician (Cardiology)  Hearing/Vision screen  Hearing Screening   125Hz  250Hz  500Hz  1000Hz  2000Hz  3000Hz  4000Hz  6000Hz  8000Hz   Right ear:           Left ear:           Comments: Patient is able to hear conversational tones without difficulty.  No issues reported.  Vision Screening Comments: Wears corrective lenses Visual acuity not assessed, virtual visit.      Dietary issues and exercise activities discussed: Current Exercise Habits: Structured exercise class, Time (Minutes): 60, Frequency (Times/Week): 2, Weekly Exercise (Minutes/Week): 120, Intensity: Mild  Goals    . Increase physical activity     Balance exercises      Depression Screen PHQ 2/9 Scores 06/02/2019 10/03/2017 06/14/2017 10/06/2015  PHQ - 2 Score 0 0 0 0  PHQ- 9 Score - - 0 -    Fall Risk Fall Risk  06/02/2019 09/22/2018 10/03/2017 06/14/2017 05/28/2017  Falls in the past year? 1 0 No No No   Comment - Emmi Telephone Survey: data to providers prior to load - - Emmi Telephone Survey: data to providers prior to load  Number falls in past yr: 0 - - - -  Injury with Fall? 1 - - - -  Comment Followed by pcp. Cane in use when ambulating. - - - -  Risk for fall due to : - - - (  No Data) -  Risk for fall due to: Comment - - - no history  -  Follow up Falls prevention discussed - - - -  Is the patient's home free of loose throw rugs in walkways, pet beds, electrical cords, etc? yes      Grab bars in the bathroom? yes      Handrails on the stairs? yes      Adequate lighting? yes  Cognitive Function:     6CIT Screen 06/02/2019  What Year? 0 points  What month? 0 points  What time? 0 points  Count back from 20 0 points  Months in reverse 0 points  Repeat phrase 0 points  Total Score 0    Screening Tests Health Maintenance  Topic Date Due  . TETANUS/TDAP  06/01/2020 (Originally 09/30/1945)  . INFLUENZA VACCINE  06/06/2019  . FOOT EXAM  08/08/2019  . OPHTHALMOLOGY EXAM  09/04/2019  . HEMOGLOBIN A1C  09/25/2019  . PNA vac Low Risk Adult  Discontinued      Plan:    End of life planning; Advance aging; Advanced directives discussed.  Copy of current HCPOA/Living Will requested.    I have personally reviewed and noted the following in the patient's chart:   . Medical and social history . Use of alcohol, tobacco or illicit drugs  . Current medications and supplements . Functional ability and status . Nutritional status . Physical activity . Advanced directives . List of other physicians . Hospitalizations, surgeries, and ER visits in previous 12 months . Vitals . Screenings to include cognitive, depression, and falls . Referrals and appointments  In addition, I have reviewed and discussed with patient certain preventive protocols, quality metrics, and best practice recommendations. A written personalized care plan for preventive services as well as general preventive  health recommendations were provided to patient.     Varney Biles, LPN   1/[id number]/4782    Reviewed above information.  Agree with assessment and plan.    Dr Nicki Reaper

## 2019-06-08 DIAGNOSIS — L821 Other seborrheic keratosis: Secondary | ICD-10-CM | POA: Diagnosis not present

## 2019-06-08 DIAGNOSIS — Z85828 Personal history of other malignant neoplasm of skin: Secondary | ICD-10-CM | POA: Diagnosis not present

## 2019-06-08 DIAGNOSIS — L570 Actinic keratosis: Secondary | ICD-10-CM | POA: Diagnosis not present

## 2019-06-08 DIAGNOSIS — L578 Other skin changes due to chronic exposure to nonionizing radiation: Secondary | ICD-10-CM | POA: Diagnosis not present

## 2019-06-08 DIAGNOSIS — D692 Other nonthrombocytopenic purpura: Secondary | ICD-10-CM | POA: Diagnosis not present

## 2019-06-08 DIAGNOSIS — L720 Epidermal cyst: Secondary | ICD-10-CM | POA: Diagnosis not present

## 2019-06-09 DIAGNOSIS — M25551 Pain in right hip: Secondary | ICD-10-CM | POA: Diagnosis not present

## 2019-06-09 DIAGNOSIS — R278 Other lack of coordination: Secondary | ICD-10-CM | POA: Diagnosis not present

## 2019-06-09 DIAGNOSIS — R262 Difficulty in walking, not elsewhere classified: Secondary | ICD-10-CM | POA: Diagnosis not present

## 2019-06-09 DIAGNOSIS — M25531 Pain in right wrist: Secondary | ICD-10-CM | POA: Diagnosis not present

## 2019-06-09 DIAGNOSIS — M25651 Stiffness of right hip, not elsewhere classified: Secondary | ICD-10-CM | POA: Diagnosis not present

## 2019-06-09 DIAGNOSIS — M25631 Stiffness of right wrist, not elsewhere classified: Secondary | ICD-10-CM | POA: Diagnosis not present

## 2019-06-10 DIAGNOSIS — H01003 Unspecified blepharitis right eye, unspecified eyelid: Secondary | ICD-10-CM | POA: Diagnosis not present

## 2019-06-11 ENCOUNTER — Ambulatory Visit (INDEPENDENT_AMBULATORY_CARE_PROVIDER_SITE_OTHER): Payer: Medicare Other | Admitting: Podiatry

## 2019-06-11 ENCOUNTER — Encounter: Payer: Self-pay | Admitting: Podiatry

## 2019-06-11 ENCOUNTER — Other Ambulatory Visit: Payer: Self-pay

## 2019-06-11 DIAGNOSIS — M79674 Pain in right toe(s): Secondary | ICD-10-CM | POA: Diagnosis not present

## 2019-06-11 DIAGNOSIS — M25551 Pain in right hip: Secondary | ICD-10-CM | POA: Diagnosis not present

## 2019-06-11 DIAGNOSIS — M79675 Pain in left toe(s): Secondary | ICD-10-CM

## 2019-06-11 DIAGNOSIS — R262 Difficulty in walking, not elsewhere classified: Secondary | ICD-10-CM | POA: Diagnosis not present

## 2019-06-11 DIAGNOSIS — M25531 Pain in right wrist: Secondary | ICD-10-CM | POA: Diagnosis not present

## 2019-06-11 DIAGNOSIS — M25651 Stiffness of right hip, not elsewhere classified: Secondary | ICD-10-CM | POA: Diagnosis not present

## 2019-06-11 DIAGNOSIS — B351 Tinea unguium: Secondary | ICD-10-CM | POA: Diagnosis not present

## 2019-06-11 DIAGNOSIS — R278 Other lack of coordination: Secondary | ICD-10-CM | POA: Diagnosis not present

## 2019-06-11 DIAGNOSIS — M25631 Stiffness of right wrist, not elsewhere classified: Secondary | ICD-10-CM | POA: Diagnosis not present

## 2019-06-11 DIAGNOSIS — E119 Type 2 diabetes mellitus without complications: Secondary | ICD-10-CM | POA: Diagnosis not present

## 2019-06-11 NOTE — Progress Notes (Signed)
Complaint:  Visit Type: Patient presents to the office for  preventative foot care services. Complaint: Patient states" my nails have grown long and thick and become painful to walk and wear shoes" Patient has been diagnosed with DM and does not take medication for diabetes.. The patient presents for preventative foot care services. No changes to ROS.    Podiatric Exam: Vascular: dorsalis pedis and posterior tibial pulses are palpable bilateral. Capillary return is immediate. Temperature gradient is WNL. Skin turgor WNL  Sensorium: Normal Semmes Weinstein monofilament test. Normal tactile sensation bilaterally. Nail Exam: Pt has thick disfigured discolored nails with subungual debris noted bilateral entire nail hallux through fifth toenails Ulcer Exam: There is no evidence of ulcer or pre-ulcerative changes or infection. Orthopedic Exam: Muscle tone and strength are WNL. No limitations in general ROM. No crepitus or effusions noted. Foot type and digits show no abnormalities. Bony prominences are unremarkable. Skin:  Porokeratosis sub 5th met left foot asymptomatic. No infection or ulcers  Diagnosis:  Onychomycosis, , Pain in right toe, pain in left toes  Treatment & Plan Procedures and Treatment: Consent by patient was obtained for treatment procedures.   Debridement of mycotic and hypertrophic toenails, 1 through 5 bilateral and clearing of subungual debris. No ulceration, no infection noted. Return Visit-Office Procedure: Patient instructed to return to the office for a follow up visit 3 months for continued evaluation and treatment.    Leocadio Heal DPM 

## 2019-06-16 DIAGNOSIS — R278 Other lack of coordination: Secondary | ICD-10-CM | POA: Diagnosis not present

## 2019-06-16 DIAGNOSIS — R262 Difficulty in walking, not elsewhere classified: Secondary | ICD-10-CM | POA: Diagnosis not present

## 2019-06-16 DIAGNOSIS — M25651 Stiffness of right hip, not elsewhere classified: Secondary | ICD-10-CM | POA: Diagnosis not present

## 2019-06-16 DIAGNOSIS — M25551 Pain in right hip: Secondary | ICD-10-CM | POA: Diagnosis not present

## 2019-06-16 DIAGNOSIS — M25631 Stiffness of right wrist, not elsewhere classified: Secondary | ICD-10-CM | POA: Diagnosis not present

## 2019-06-16 DIAGNOSIS — M25531 Pain in right wrist: Secondary | ICD-10-CM | POA: Diagnosis not present

## 2019-06-18 DIAGNOSIS — M25631 Stiffness of right wrist, not elsewhere classified: Secondary | ICD-10-CM | POA: Diagnosis not present

## 2019-06-18 DIAGNOSIS — R262 Difficulty in walking, not elsewhere classified: Secondary | ICD-10-CM | POA: Diagnosis not present

## 2019-06-18 DIAGNOSIS — M25531 Pain in right wrist: Secondary | ICD-10-CM | POA: Diagnosis not present

## 2019-06-18 DIAGNOSIS — M25651 Stiffness of right hip, not elsewhere classified: Secondary | ICD-10-CM | POA: Diagnosis not present

## 2019-06-18 DIAGNOSIS — R278 Other lack of coordination: Secondary | ICD-10-CM | POA: Diagnosis not present

## 2019-06-18 DIAGNOSIS — M25551 Pain in right hip: Secondary | ICD-10-CM | POA: Diagnosis not present

## 2019-06-24 DIAGNOSIS — M25551 Pain in right hip: Secondary | ICD-10-CM | POA: Diagnosis not present

## 2019-06-24 DIAGNOSIS — M25631 Stiffness of right wrist, not elsewhere classified: Secondary | ICD-10-CM | POA: Diagnosis not present

## 2019-06-24 DIAGNOSIS — M25531 Pain in right wrist: Secondary | ICD-10-CM | POA: Diagnosis not present

## 2019-06-24 DIAGNOSIS — R278 Other lack of coordination: Secondary | ICD-10-CM | POA: Diagnosis not present

## 2019-06-24 DIAGNOSIS — R262 Difficulty in walking, not elsewhere classified: Secondary | ICD-10-CM | POA: Diagnosis not present

## 2019-06-24 DIAGNOSIS — M25651 Stiffness of right hip, not elsewhere classified: Secondary | ICD-10-CM | POA: Diagnosis not present

## 2019-06-25 DIAGNOSIS — M25531 Pain in right wrist: Secondary | ICD-10-CM | POA: Diagnosis not present

## 2019-06-25 DIAGNOSIS — M25651 Stiffness of right hip, not elsewhere classified: Secondary | ICD-10-CM | POA: Diagnosis not present

## 2019-06-25 DIAGNOSIS — M25631 Stiffness of right wrist, not elsewhere classified: Secondary | ICD-10-CM | POA: Diagnosis not present

## 2019-06-25 DIAGNOSIS — R262 Difficulty in walking, not elsewhere classified: Secondary | ICD-10-CM | POA: Diagnosis not present

## 2019-06-25 DIAGNOSIS — M25551 Pain in right hip: Secondary | ICD-10-CM | POA: Diagnosis not present

## 2019-06-25 DIAGNOSIS — R278 Other lack of coordination: Secondary | ICD-10-CM | POA: Diagnosis not present

## 2019-06-29 DIAGNOSIS — R262 Difficulty in walking, not elsewhere classified: Secondary | ICD-10-CM | POA: Diagnosis not present

## 2019-06-29 DIAGNOSIS — M25651 Stiffness of right hip, not elsewhere classified: Secondary | ICD-10-CM | POA: Diagnosis not present

## 2019-06-29 DIAGNOSIS — M25631 Stiffness of right wrist, not elsewhere classified: Secondary | ICD-10-CM | POA: Diagnosis not present

## 2019-06-29 DIAGNOSIS — M25531 Pain in right wrist: Secondary | ICD-10-CM | POA: Diagnosis not present

## 2019-06-29 DIAGNOSIS — M25551 Pain in right hip: Secondary | ICD-10-CM | POA: Diagnosis not present

## 2019-06-29 DIAGNOSIS — R278 Other lack of coordination: Secondary | ICD-10-CM | POA: Diagnosis not present

## 2019-07-01 ENCOUNTER — Ambulatory Visit (INDEPENDENT_AMBULATORY_CARE_PROVIDER_SITE_OTHER): Payer: Medicare Other

## 2019-07-01 ENCOUNTER — Other Ambulatory Visit: Payer: Self-pay

## 2019-07-01 DIAGNOSIS — E538 Deficiency of other specified B group vitamins: Secondary | ICD-10-CM

## 2019-07-01 DIAGNOSIS — M25551 Pain in right hip: Secondary | ICD-10-CM | POA: Diagnosis not present

## 2019-07-01 DIAGNOSIS — R262 Difficulty in walking, not elsewhere classified: Secondary | ICD-10-CM | POA: Diagnosis not present

## 2019-07-01 DIAGNOSIS — R278 Other lack of coordination: Secondary | ICD-10-CM | POA: Diagnosis not present

## 2019-07-01 DIAGNOSIS — M25651 Stiffness of right hip, not elsewhere classified: Secondary | ICD-10-CM | POA: Diagnosis not present

## 2019-07-01 MED ORDER — CYANOCOBALAMIN 1000 MCG/ML IJ SOLN
1000.0000 ug | Freq: Once | INTRAMUSCULAR | Status: AC
  Administered 2019-07-01: 1000 ug via INTRAMUSCULAR

## 2019-07-01 NOTE — Progress Notes (Addendum)
Patient presented today for B12 injection. Administered IM in right deltoid.  Patient tolerated well with no signs of distress.  Last b12 04/16/2018 645.  Agree with plan. Mable Paris, NP

## 2019-07-03 DIAGNOSIS — S52501D Unspecified fracture of the lower end of right radius, subsequent encounter for closed fracture with routine healing: Secondary | ICD-10-CM | POA: Diagnosis not present

## 2019-07-03 DIAGNOSIS — G5601 Carpal tunnel syndrome, right upper limb: Secondary | ICD-10-CM | POA: Diagnosis not present

## 2019-07-07 DIAGNOSIS — M25531 Pain in right wrist: Secondary | ICD-10-CM | POA: Diagnosis not present

## 2019-07-07 DIAGNOSIS — R278 Other lack of coordination: Secondary | ICD-10-CM | POA: Diagnosis not present

## 2019-07-07 DIAGNOSIS — M25651 Stiffness of right hip, not elsewhere classified: Secondary | ICD-10-CM | POA: Diagnosis not present

## 2019-07-07 DIAGNOSIS — R262 Difficulty in walking, not elsewhere classified: Secondary | ICD-10-CM | POA: Diagnosis not present

## 2019-07-07 DIAGNOSIS — M25631 Stiffness of right wrist, not elsewhere classified: Secondary | ICD-10-CM | POA: Diagnosis not present

## 2019-07-07 DIAGNOSIS — M25551 Pain in right hip: Secondary | ICD-10-CM | POA: Diagnosis not present

## 2019-07-09 DIAGNOSIS — R262 Difficulty in walking, not elsewhere classified: Secondary | ICD-10-CM | POA: Diagnosis not present

## 2019-07-09 DIAGNOSIS — M25651 Stiffness of right hip, not elsewhere classified: Secondary | ICD-10-CM | POA: Diagnosis not present

## 2019-07-09 DIAGNOSIS — M25551 Pain in right hip: Secondary | ICD-10-CM | POA: Diagnosis not present

## 2019-07-09 DIAGNOSIS — R278 Other lack of coordination: Secondary | ICD-10-CM | POA: Diagnosis not present

## 2019-07-10 ENCOUNTER — Ambulatory Visit (INDEPENDENT_AMBULATORY_CARE_PROVIDER_SITE_OTHER): Payer: Medicare Other | Admitting: Internal Medicine

## 2019-07-10 ENCOUNTER — Other Ambulatory Visit: Payer: Self-pay

## 2019-07-10 DIAGNOSIS — G7000 Myasthenia gravis without (acute) exacerbation: Secondary | ICD-10-CM

## 2019-07-10 DIAGNOSIS — E7800 Pure hypercholesterolemia, unspecified: Secondary | ICD-10-CM | POA: Diagnosis not present

## 2019-07-10 DIAGNOSIS — E119 Type 2 diabetes mellitus without complications: Secondary | ICD-10-CM | POA: Diagnosis not present

## 2019-07-10 DIAGNOSIS — E538 Deficiency of other specified B group vitamins: Secondary | ICD-10-CM | POA: Diagnosis not present

## 2019-07-10 DIAGNOSIS — D649 Anemia, unspecified: Secondary | ICD-10-CM

## 2019-07-10 DIAGNOSIS — I10 Essential (primary) hypertension: Secondary | ICD-10-CM

## 2019-07-10 DIAGNOSIS — S52501S Unspecified fracture of the lower end of right radius, sequela: Secondary | ICD-10-CM | POA: Diagnosis not present

## 2019-07-10 NOTE — Progress Notes (Signed)
Patient ID: [name], male   DOB: 1926-07-10, 85 y.o.   MRN: [id number]   Virtual Visit via telephone Note  This visit type was conducted due to national recommendations for restrictions regarding the COVID-19 pandemic (e.g. social distancing).  This format is felt to be most appropriate for this patient at this time.  All issues noted in this document were discussed and addressed.  No physical exam was performed (except for noted visual exam findings with Video Visits).   I connected with [name] by telephone and verified that I am speaking with the correct person using two identifiers. Location patient: home Location provider: work  Persons participating in the telephone visit: patient, provider  I discussed the limitations, risks, security and privacy concerns of performing an evaluation and management service by telephone and the availability of in person appointments. The patient expressed understanding and agreed to proceed.   Reason for visit: scheduled follow up  HPI: He reports he is doing relatively well.  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  Is s/p a nondisplaced right distal radius fracture.   Has been wearing a wrist splint. Helping.  Has been going to therapy.  Better.  Elected to continue current treatment.  Getting around ok. No further falls.  No chest pain. No sob. No acid reflux. No abdominal pain.  Bowels moving if takes two Senna s daily.   On cellcept for myasthenia.  Stable.  States blood sugars are averaging 120-130s in am.  Last a1c improved.  Some nocturia.  Not a significant issue for him.  Legs - swelling down at night.     ROS: See pertinent positives and negatives per HPI.  Past Medical History:  Diagnosis Date  . Allergic rhinitis   . BPH (benign prostatic hypertrophy)   . Degenerative arthritis   . Diabetes mellitus ([place])   . GERD (gastroesophageal reflux disease)   . HTN (hypertension)   . Hypercholesterolemia   .  HYPERTENSION, BENIGN 08/15/2010   Qualifier: Diagnosis of  By: Rockey Situ MD, Tim    . Iron deficiency anemia   . Skin cancer   . TIA (transient ischemic attack) 09/13/2015  . Urinary outflow obstruction    mild    Past Surgical History:  Procedure Laterality Date  . PILONIDAL CYST EXCISION  1951   removal  . [place]  . SKIN CANCER EXCISION     multiple  . SQUAMOUS CELL CARCINOMA EXCISION     behind head  . TONSILLECTOMY AND ADENOIDECTOMY  1938    Family History  Problem Relation Age of Onset  . Heart disease Father        heart atack  . Alzheimer's disease Mother   . CVA Brother     SOCIAL HX: reviewed.    Current Outpatient Medications:  .  ACCU-CHEK FASTCLIX LANCETS MISC, Use daily as directed to check sugars twice daily. DX E11.9, Disp: 102 each, Rfl: 12 .  amLODipine (NORVASC) 5 MG tablet, Take 1 tablet (5 mg total) by mouth daily., Disp: 30 tablet, Rfl: 3 .  aspirin (ASPIR-LOW) 81 MG EC tablet, Take 81 mg by mouth daily.  , Disp: , Rfl:  .  calcium-vitamin D (OSCAL 500/200 D-3) 500-200 MG-UNIT per tablet, Take 1 tablet by mouth 2 (two) times daily. , Disp: , Rfl:  .  cyanocobalamin (,VITAMIN B-12,) 1000 MCG/ML injection, Inject 1,000 mcg into the muscle every 30 (thirty) days., Disp: , Rfl:  .  docusate  sodium (COLACE) 100 MG capsule, Take by mouth., Disp: , Rfl:  .  finasteride (PROSCAR) 5 MG tablet, Take 1 tablet (5 mg total) by mouth daily., Disp: 90 tablet, Rfl: 3 .  glucose blood (ACCU-CHEK GUIDE) test strip, Use twice daily to check blood sugars Dx E11.9, Disp: 100 each, Rfl: 12 .  lovastatin (MEVACOR) 20 MG tablet, Take 1 tablet (20 mg total) by mouth at bedtime., Disp: 90 tablet, Rfl: 1 .  metFORMIN (GLUCOPHAGE-XR) 500 MG 24 hr tablet, TAKE 1 TABLET BY MOUTH ONCE A DAY WITH BREAKFAST, Disp: 90 tablet, Rfl: 0 .  mycophenolate (CELLCEPT) 500 MG tablet, Take 2 tablets (1,000 mg total) by mouth 2 (two) times daily., Disp: , Rfl:  .  olmesartan  (BENICAR) 40 MG tablet, Take 1 tablet (40 mg total) by mouth daily., Disp: 30 tablet, Rfl: 2 .  tamsulosin (FLOMAX) 0.4 MG CAPS capsule, Take 1 capsule (0.4 mg total) by mouth daily after supper., Disp: 90 capsule, Rfl: 3  EXAM:  VITALS per patient if applicable:  Weight 903 pounds.  118/80  GENERAL: alert.  Sounds to be in no acute distress.  Answering questions appropriately.    PSYCH/NEURO: pleasant and cooperative, no obvious depression or anxiety, speech and thought processing grossly intact  ASSESSMENT AND PLAN:  Discussed the following assessment and plan:  Diabetes mellitus Last a1c improved.  Sugars as outlined.  Continue low carb diet and exercise.  Follow met b and a1c.   Anemia Follow cbc.   B12 deficiency Continue b12 injections.    Closed fracture of right distal radius Followed by ortho. Discussed bone density.  He will notify me when agreeable.    Hypercholesterolemia On lovastatin.  Low cholesterol diet and exercise.  Follow lipid panel and liver function tests.    HYPERTENSION, BENIGN Blood pressure under good control.  Continue same medication regimen.  Follow pressures.  Follow metabolic panel.    Myasthenia gravis ([place]) On cellcept.  Seeing neurology.  Stable.      I discussed the assessment and treatment plan with the patient. The patient was provided an opportunity to ask questions and all were answered. The patient agreed with the plan and demonstrated an understanding of the instructions.   The patient was advised to call back or seek an in-person evaluation if the symptoms worsen or if the condition fails to improve as anticipated.  I provided 22 minutes of non-face-to-face time during this encounter.   Einar Pheasant, MD

## 2019-07-13 DIAGNOSIS — M25651 Stiffness of right hip, not elsewhere classified: Secondary | ICD-10-CM | POA: Diagnosis not present

## 2019-07-13 DIAGNOSIS — M25551 Pain in right hip: Secondary | ICD-10-CM | POA: Diagnosis not present

## 2019-07-13 DIAGNOSIS — R278 Other lack of coordination: Secondary | ICD-10-CM | POA: Diagnosis not present

## 2019-07-13 DIAGNOSIS — R262 Difficulty in walking, not elsewhere classified: Secondary | ICD-10-CM | POA: Diagnosis not present

## 2019-07-14 ENCOUNTER — Encounter: Payer: Self-pay | Admitting: Internal Medicine

## 2019-07-14 NOTE — Assessment & Plan Note (Signed)
On lovastatin.  Low cholesterol diet and exercise.  Follow lipid panel and liver function tests.   

## 2019-07-14 NOTE — Assessment & Plan Note (Addendum)
Followed by ortho. Discussed bone density.  He will notify me when agreeable.

## 2019-07-14 NOTE — Assessment & Plan Note (Signed)
Continue b12 injections.  

## 2019-07-14 NOTE — Assessment & Plan Note (Signed)
On cellcept.  Seeing neurology.  Stable.   

## 2019-07-14 NOTE — Assessment & Plan Note (Signed)
Follow cbc.  

## 2019-07-14 NOTE — Assessment & Plan Note (Signed)
Last a1c improved.  Sugars as outlined.  Continue low carb diet and exercise.  Follow met b and a1c.

## 2019-07-14 NOTE — Assessment & Plan Note (Signed)
Blood pressure under good control.  Continue same medication regimen.  Follow pressures.  Follow metabolic panel.   

## 2019-07-15 DIAGNOSIS — M25651 Stiffness of right hip, not elsewhere classified: Secondary | ICD-10-CM | POA: Diagnosis not present

## 2019-07-15 DIAGNOSIS — R278 Other lack of coordination: Secondary | ICD-10-CM | POA: Diagnosis not present

## 2019-07-15 DIAGNOSIS — R262 Difficulty in walking, not elsewhere classified: Secondary | ICD-10-CM | POA: Diagnosis not present

## 2019-07-15 DIAGNOSIS — M25551 Pain in right hip: Secondary | ICD-10-CM | POA: Diagnosis not present

## 2019-07-20 DIAGNOSIS — M25551 Pain in right hip: Secondary | ICD-10-CM | POA: Diagnosis not present

## 2019-07-20 DIAGNOSIS — R262 Difficulty in walking, not elsewhere classified: Secondary | ICD-10-CM | POA: Diagnosis not present

## 2019-07-20 DIAGNOSIS — R278 Other lack of coordination: Secondary | ICD-10-CM | POA: Diagnosis not present

## 2019-07-20 DIAGNOSIS — M25651 Stiffness of right hip, not elsewhere classified: Secondary | ICD-10-CM | POA: Diagnosis not present

## 2019-07-22 DIAGNOSIS — M25551 Pain in right hip: Secondary | ICD-10-CM | POA: Diagnosis not present

## 2019-07-22 DIAGNOSIS — R262 Difficulty in walking, not elsewhere classified: Secondary | ICD-10-CM | POA: Diagnosis not present

## 2019-07-22 DIAGNOSIS — M25651 Stiffness of right hip, not elsewhere classified: Secondary | ICD-10-CM | POA: Diagnosis not present

## 2019-07-22 DIAGNOSIS — R278 Other lack of coordination: Secondary | ICD-10-CM | POA: Diagnosis not present

## 2019-07-23 ENCOUNTER — Other Ambulatory Visit: Payer: Self-pay

## 2019-07-23 ENCOUNTER — Other Ambulatory Visit (INDEPENDENT_AMBULATORY_CARE_PROVIDER_SITE_OTHER): Payer: Medicare Other

## 2019-07-23 DIAGNOSIS — E119 Type 2 diabetes mellitus without complications: Secondary | ICD-10-CM

## 2019-07-23 DIAGNOSIS — D649 Anemia, unspecified: Secondary | ICD-10-CM

## 2019-07-23 DIAGNOSIS — E7800 Pure hypercholesterolemia, unspecified: Secondary | ICD-10-CM | POA: Diagnosis not present

## 2019-07-23 DIAGNOSIS — I10 Essential (primary) hypertension: Secondary | ICD-10-CM

## 2019-07-23 LAB — MICROALBUMIN / CREATININE URINE RATIO
Creatinine,U: 94.6 mg/dL
Microalb Creat Ratio: 8 mg/g (ref 0.0–30.0)
Microalb, Ur: 7.6 mg/dL — ABNORMAL HIGH (ref 0.0–1.9)

## 2019-07-23 LAB — CBC WITH DIFFERENTIAL/PLATELET
Basophils Absolute: 0.1 10*3/uL (ref 0.0–0.1)
Basophils Relative: 1.3 % (ref 0.0–3.0)
Eosinophils Absolute: 0.1 10*3/uL (ref 0.0–0.7)
Eosinophils Relative: 2.6 % (ref 0.0–5.0)
HCT: 37.2 % — ABNORMAL LOW (ref 39.0–52.0)
Hemoglobin: 12.3 g/dL — ABNORMAL LOW (ref 13.0–17.0)
Lymphocytes Relative: 24.4 % (ref 12.0–46.0)
Lymphs Abs: 1.3 10*3/uL (ref 0.7–4.0)
MCHC: 33.1 g/dL (ref 30.0–36.0)
MCV: 89.3 fl (ref 78.0–100.0)
Monocytes Absolute: 0.4 10*3/uL (ref 0.1–1.0)
Monocytes Relative: 7.4 % (ref 3.0–12.0)
Neutro Abs: 3.4 10*3/uL (ref 1.4–7.7)
Neutrophils Relative %: 64.3 % (ref 43.0–77.0)
Platelets: 277 10*3/uL (ref 150.0–400.0)
RBC: 4.16 Mil/uL — ABNORMAL LOW (ref 4.22–5.81)
RDW: 14.1 % (ref 11.5–15.5)
WBC: 5.2 10*3/uL (ref 4.0–10.5)

## 2019-07-23 LAB — HEPATIC FUNCTION PANEL
ALT: 15 U/L (ref 0–53)
AST: 20 U/L (ref 0–37)
Albumin: 3.9 g/dL (ref 3.5–5.2)
Alkaline Phosphatase: 41 U/L (ref 39–117)
Bilirubin, Direct: 0.1 mg/dL (ref 0.0–0.3)
Total Bilirubin: 0.8 mg/dL (ref 0.2–1.2)
Total Protein: 7 g/dL (ref 6.0–8.3)

## 2019-07-23 LAB — BASIC METABOLIC PANEL
BUN: 13 mg/dL (ref 6–23)
CO2: 26 mEq/L (ref 19–32)
Calcium: 9.4 mg/dL (ref 8.4–10.5)
Chloride: 105 mEq/L (ref 96–112)
Creatinine, Ser: 0.71 mg/dL (ref 0.40–1.50)
GFR: 104.04 mL/min (ref >60.00)
Glucose, Bld: 138 mg/dL — ABNORMAL HIGH (ref 70–99)
Potassium: 4 mEq/L (ref 3.5–5.1)
Sodium: 139 mEq/L (ref 135–145)

## 2019-07-23 LAB — LIPID PANEL
Cholesterol: 137 mg/dL (ref 0–200)
HDL: 50.3 mg/dL (ref >39.00)
LDL Cholesterol: 65 mg/dL (ref 0–99)
NonHDL: 86.34
Total CHOL/HDL Ratio: 3
Triglycerides: 108 mg/dL (ref 0.0–149.0)
VLDL: 21.6 mg/dL (ref 0.0–40.0)

## 2019-07-23 LAB — HEMOGLOBIN A1C: Hgb A1c MFr Bld: 7.2 % — ABNORMAL HIGH (ref 4.6–6.5)

## 2019-07-23 LAB — TSH: TSH: 3.52 u[IU]/mL (ref 0.35–4.50)

## 2019-07-27 NOTE — Progress Notes (Signed)
Cardiology Office Note  Date:  07/28/2019   ID:  [name], DOB 1925/11/13, MRN [id number]  PCP:  Einar Pheasant, MD   Chief Complaint  Patient presents with  . Other    12 month follow up. atient c/o swelling in ankles. Meds reviewed verbally with patient.     HPI:  [name] is a 85 yo male with a h/o  Myasthenia Gravis HTN,  diabetes, hemoglobin A1c 8.9 obesity ,  hyperlipidemia,  pernicious anemia, palpitations    hyponatremia Presenting for routine followup of his hypertension  Fall with hip fracture, Had surgery , some soreness  HBA1c 7.2 Total chol 137, LDL 65 CR 0.7 Sodium 139  okay Weight loss, intentional In PT, for balance  Little swelling R>L Denies significant shortness of breath Does not like wearing compression hose, instead wears diabetic socks Lots of sitting  EKG personally reviewed by myself on todays visit NSR rate 73 bpm no significant ST or T-wave changes, left anterior fascicular block  Other PMH reviewed diagnosis of Myasthenia Gravis severe muscle weakness, arms, occular, facial had 5 treatments over 2 weeks, QOD On cellcept since Jan 2018 On today's visit reports that when he wakes up 1 of his eyelids is slow to get out of the way Through the course of the morning usually resolves and goes back to normal  Wife died 17-Nov-2017, severe Parkinson's    PMH:   has a past medical history of Allergic rhinitis, BPH (benign prostatic hypertrophy), Degenerative arthritis, Diabetes mellitus ([place]), GERD (gastroesophageal reflux disease), HTN (hypertension), Hypercholesterolemia, HYPERTENSION, BENIGN (08/15/2010), Iron deficiency anemia, Skin cancer, TIA (transient ischemic attack) (09/13/2015), and Urinary outflow obstruction.  PSH:    Past Surgical History:  Procedure Laterality Date  . PARTIAL HIP ARTHROPLASTY  01/2019  . PILONIDAL CYST EXCISION  1951   removal  . [place]  . SKIN CANCER EXCISION     multiple   . SQUAMOUS CELL CARCINOMA EXCISION     behind head  . TONSILLECTOMY AND ADENOIDECTOMY  1938    Current Outpatient Medications  Medication Sig Dispense Refill  . ACCU-CHEK FASTCLIX LANCETS MISC Use daily as directed to check sugars twice daily. DX E11.9 102 each 12  . amLODipine (NORVASC) 5 MG tablet Take 1 tablet (5 mg total) by mouth daily. 30 tablet 3  . aspirin (ASPIR-LOW) 81 MG EC tablet Take 81 mg by mouth daily.      . calcium-vitamin D (OSCAL 500/200 D-3) 500-200 MG-UNIT per tablet Take 1 tablet by mouth 2 (two) times daily.     . cyanocobalamin (,VITAMIN B-12,) 1000 MCG/ML injection Inject 1,000 mcg into the muscle every 30 (thirty) days.    Marland Kitchen docusate sodium (COLACE) 100 MG capsule Take by mouth.    . finasteride (PROSCAR) 5 MG tablet Take 1 tablet (5 mg total) by mouth daily. 90 tablet 3  . glucose blood (ACCU-CHEK GUIDE) test strip Use twice daily to check blood sugars Dx E11.9 100 each 12  . lovastatin (MEVACOR) 20 MG tablet Take 1 tablet (20 mg total) by mouth at bedtime. 90 tablet 1  . metFORMIN (GLUCOPHAGE-XR) 500 MG 24 hr tablet TAKE 1 TABLET BY MOUTH ONCE A DAY WITH BREAKFAST 90 tablet 0  . mycophenolate (CELLCEPT) 500 MG tablet Take 2 tablets (1,000 mg total) by mouth 2 (two) times daily.    Marland Kitchen olmesartan (BENICAR) 40 MG tablet Take 1 tablet (40 mg total) by mouth daily. 30 tablet 2  . tamsulosin (FLOMAX)  0.4 MG CAPS capsule Take 1 capsule (0.4 mg total) by mouth daily after supper. 90 capsule 3   No current facility-administered medications for this visit.      Allergies:   Magnesium and Penicillamine   Social History:  The patient  reports that he has never smoked. He has never used smokeless tobacco. He reports that he does not drink alcohol or use drugs.   Family History:   family history includes Alzheimer's disease in his mother; CVA in his brother; Heart disease in his father.    Review of Systems: Review of Systems  Constitutional: Negative.    Respiratory: Negative.   Cardiovascular: Positive for leg swelling.  Gastrointestinal: Negative.   Musculoskeletal: Negative.   Neurological: Negative.   Psychiatric/Behavioral: Negative.   All other systems reviewed and are negative.    PHYSICAL EXAM: VS:  BP 138/60 (BP Location: Right Arm, Patient Position: Sitting, Cuff Size: Normal)   Pulse 73   Temp (!) 97.2 F (36.2 C)   Ht 5\' 11"  (1.803 m)   Wt 211 lb (95.7 kg)   BMI 29.43 kg/m  , BMI Body mass index is 29.43 kg/m. Constitutional:  oriented to person, place, and time. No distress.  HENT:  Head: Grossly normal Eyes:  no discharge. No scleral icterus.  Neck: No JVD, no carotid bruits  Cardiovascular: Regular rate and rhythm, no murmurs appreciated Pulmonary/Chest: Clear to auscultation bilaterally, no wheezes or rails Abdominal: Soft.  no distension.  no tenderness.  Musculoskeletal: Normal range of motion Neurological:  normal muscle tone. Coordination normal. No atrophy Skin: Skin warm and dry Psychiatric: normal affect, pleasant   Recent Labs: 07/23/2019: ALT 15; BUN 13; Creatinine, Ser 0.71; Hemoglobin 12.3; Platelets 277.0; Potassium 4.0; Sodium 139; TSH 3.52    Lipid Panel Lab Results  Component Value Date   CHOL 137 07/23/2019   HDL 50.30 07/23/2019   LDLCALC 65 07/23/2019   TRIG 108.0 07/23/2019      Wt Readings from Last 3 Encounters:  07/28/19 211 lb (95.7 kg)  07/10/19 206 lb (93.4 kg)  11/20/18 227 lb 3.2 oz (103.1 kg)     ASSESSMENT AND PLAN:   Hypercholesterolemia Cholesterol is at goal, continue current medication  HYPERTENSION, BENIGN Reports blood pressure well controlled at home Dramatic weight loss over the past year, no medication changes made  Type 2 diabetes mellitus without complication, without long-term current use of insulin (HCC) Reports weight down to 230s down to 211 Doing much better, eating less A1c improved  Obesity (BMI 30.0-34.9 Discussed with him again,  trend of his weight coming down is wonderful Complemented him on better numbers  Myasthenia gravis ([place]) On CellCept Denies having any progression in his disease Recommended regular exercise program  Hyponatremia Stable, 139 Lab work reviewed with him  Leg swelling  dependent edema, would benefit from compression hose Again discussed with him, very mild symptoms worse on the right Chronic issue   Total encounter time more than 25 minutes  Greater than 50% was spent in counseling and coordination of care with the patient   Disposition:   F/U  12 months   Orders Placed This Encounter  Procedures  . EKG 12-Lead     Signed, Esmond Plants, M.D., Ph.D. 07/28/2019  [place], [place]

## 2019-07-28 ENCOUNTER — Other Ambulatory Visit: Payer: Self-pay

## 2019-07-28 ENCOUNTER — Encounter: Payer: Self-pay | Admitting: Cardiovascular Disease

## 2019-07-28 ENCOUNTER — Encounter: Payer: Self-pay | Admitting: Internal Medicine

## 2019-07-28 ENCOUNTER — Ambulatory Visit (INDEPENDENT_AMBULATORY_CARE_PROVIDER_SITE_OTHER): Payer: Medicare Other | Admitting: Cardiovascular Disease

## 2019-07-28 VITALS — BP 138/60 | HR 73 | Temp 97.2°F | Ht 71.0 in | Wt 211.0 lb

## 2019-07-28 DIAGNOSIS — R278 Other lack of coordination: Secondary | ICD-10-CM | POA: Diagnosis not present

## 2019-07-28 DIAGNOSIS — G459 Transient cerebral ischemic attack, unspecified: Secondary | ICD-10-CM

## 2019-07-28 DIAGNOSIS — I10 Essential (primary) hypertension: Secondary | ICD-10-CM | POA: Diagnosis not present

## 2019-07-28 DIAGNOSIS — R002 Palpitations: Secondary | ICD-10-CM | POA: Diagnosis not present

## 2019-07-28 DIAGNOSIS — E7800 Pure hypercholesterolemia, unspecified: Secondary | ICD-10-CM

## 2019-07-28 DIAGNOSIS — E1165 Type 2 diabetes mellitus with hyperglycemia: Secondary | ICD-10-CM

## 2019-07-28 DIAGNOSIS — M25551 Pain in right hip: Secondary | ICD-10-CM | POA: Diagnosis not present

## 2019-07-28 DIAGNOSIS — G7000 Myasthenia gravis without (acute) exacerbation: Secondary | ICD-10-CM | POA: Diagnosis not present

## 2019-07-28 DIAGNOSIS — R262 Difficulty in walking, not elsewhere classified: Secondary | ICD-10-CM | POA: Diagnosis not present

## 2019-07-28 DIAGNOSIS — M25651 Stiffness of right hip, not elsewhere classified: Secondary | ICD-10-CM | POA: Diagnosis not present

## 2019-07-28 NOTE — Patient Instructions (Signed)

## 2019-07-30 DIAGNOSIS — M25651 Stiffness of right hip, not elsewhere classified: Secondary | ICD-10-CM | POA: Diagnosis not present

## 2019-07-30 DIAGNOSIS — R262 Difficulty in walking, not elsewhere classified: Secondary | ICD-10-CM | POA: Diagnosis not present

## 2019-07-30 DIAGNOSIS — R278 Other lack of coordination: Secondary | ICD-10-CM | POA: Diagnosis not present

## 2019-07-30 DIAGNOSIS — M25551 Pain in right hip: Secondary | ICD-10-CM | POA: Diagnosis not present

## 2019-08-03 ENCOUNTER — Other Ambulatory Visit: Payer: Self-pay

## 2019-08-03 ENCOUNTER — Ambulatory Visit (INDEPENDENT_AMBULATORY_CARE_PROVIDER_SITE_OTHER): Payer: Medicare Other

## 2019-08-03 DIAGNOSIS — Z23 Encounter for immunization: Secondary | ICD-10-CM | POA: Diagnosis not present

## 2019-08-03 DIAGNOSIS — E538 Deficiency of other specified B group vitamins: Secondary | ICD-10-CM | POA: Diagnosis not present

## 2019-08-03 MED ORDER — CYANOCOBALAMIN 1000 MCG/ML IJ SOLN
1000.0000 ug | Freq: Once | INTRAMUSCULAR | Status: AC
  Administered 2019-08-03: 1000 ug via INTRAMUSCULAR

## 2019-08-03 NOTE — Progress Notes (Addendum)
Patient presented today for B12 injection.  Administered IM in left deltoid.  Patient tolerated well with no signs of distress.  Reviewed.  Dr Scott  

## 2019-08-04 DIAGNOSIS — M25531 Pain in right wrist: Secondary | ICD-10-CM | POA: Diagnosis not present

## 2019-08-04 DIAGNOSIS — M25631 Stiffness of right wrist, not elsewhere classified: Secondary | ICD-10-CM | POA: Diagnosis not present

## 2019-08-04 DIAGNOSIS — R262 Difficulty in walking, not elsewhere classified: Secondary | ICD-10-CM | POA: Diagnosis not present

## 2019-08-04 DIAGNOSIS — M25651 Stiffness of right hip, not elsewhere classified: Secondary | ICD-10-CM | POA: Diagnosis not present

## 2019-08-04 DIAGNOSIS — M25551 Pain in right hip: Secondary | ICD-10-CM | POA: Diagnosis not present

## 2019-08-04 DIAGNOSIS — R278 Other lack of coordination: Secondary | ICD-10-CM | POA: Diagnosis not present

## 2019-08-06 DIAGNOSIS — M25651 Stiffness of right hip, not elsewhere classified: Secondary | ICD-10-CM | POA: Diagnosis not present

## 2019-08-06 DIAGNOSIS — M25551 Pain in right hip: Secondary | ICD-10-CM | POA: Diagnosis not present

## 2019-08-06 DIAGNOSIS — R278 Other lack of coordination: Secondary | ICD-10-CM | POA: Diagnosis not present

## 2019-08-06 DIAGNOSIS — R262 Difficulty in walking, not elsewhere classified: Secondary | ICD-10-CM | POA: Diagnosis not present

## 2019-08-11 DIAGNOSIS — M25651 Stiffness of right hip, not elsewhere classified: Secondary | ICD-10-CM | POA: Diagnosis not present

## 2019-08-11 DIAGNOSIS — R262 Difficulty in walking, not elsewhere classified: Secondary | ICD-10-CM | POA: Diagnosis not present

## 2019-08-11 DIAGNOSIS — M25551 Pain in right hip: Secondary | ICD-10-CM | POA: Diagnosis not present

## 2019-08-11 DIAGNOSIS — R278 Other lack of coordination: Secondary | ICD-10-CM | POA: Diagnosis not present

## 2019-08-13 DIAGNOSIS — R262 Difficulty in walking, not elsewhere classified: Secondary | ICD-10-CM | POA: Diagnosis not present

## 2019-08-13 DIAGNOSIS — M25651 Stiffness of right hip, not elsewhere classified: Secondary | ICD-10-CM | POA: Diagnosis not present

## 2019-08-13 DIAGNOSIS — M25551 Pain in right hip: Secondary | ICD-10-CM | POA: Diagnosis not present

## 2019-08-13 DIAGNOSIS — R278 Other lack of coordination: Secondary | ICD-10-CM | POA: Diagnosis not present

## 2019-08-18 ENCOUNTER — Ambulatory Visit: Payer: Medicare Other | Admitting: Urology

## 2019-08-18 DIAGNOSIS — R262 Difficulty in walking, not elsewhere classified: Secondary | ICD-10-CM | POA: Diagnosis not present

## 2019-08-18 DIAGNOSIS — M25651 Stiffness of right hip, not elsewhere classified: Secondary | ICD-10-CM | POA: Diagnosis not present

## 2019-08-18 DIAGNOSIS — R278 Other lack of coordination: Secondary | ICD-10-CM | POA: Diagnosis not present

## 2019-08-18 DIAGNOSIS — M25551 Pain in right hip: Secondary | ICD-10-CM | POA: Diagnosis not present

## 2019-08-20 DIAGNOSIS — M25651 Stiffness of right hip, not elsewhere classified: Secondary | ICD-10-CM | POA: Diagnosis not present

## 2019-08-20 DIAGNOSIS — R262 Difficulty in walking, not elsewhere classified: Secondary | ICD-10-CM | POA: Diagnosis not present

## 2019-08-20 DIAGNOSIS — R278 Other lack of coordination: Secondary | ICD-10-CM | POA: Diagnosis not present

## 2019-08-20 DIAGNOSIS — M25551 Pain in right hip: Secondary | ICD-10-CM | POA: Diagnosis not present

## 2019-08-24 DIAGNOSIS — S72001D Fracture of unspecified part of neck of right femur, subsequent encounter for closed fracture with routine healing: Secondary | ICD-10-CM | POA: Diagnosis not present

## 2019-08-24 DIAGNOSIS — S72001A Fracture of unspecified part of neck of right femur, initial encounter for closed fracture: Secondary | ICD-10-CM | POA: Diagnosis not present

## 2019-08-24 DIAGNOSIS — I998 Other disorder of circulatory system: Secondary | ICD-10-CM | POA: Diagnosis not present

## 2019-08-24 DIAGNOSIS — M47816 Spondylosis without myelopathy or radiculopathy, lumbar region: Secondary | ICD-10-CM | POA: Diagnosis not present

## 2019-08-24 DIAGNOSIS — M1612 Unilateral primary osteoarthritis, left hip: Secondary | ICD-10-CM | POA: Diagnosis not present

## 2019-08-24 DIAGNOSIS — Y92513 Shop (commercial) as the place of occurrence of the external cause: Secondary | ICD-10-CM | POA: Diagnosis not present

## 2019-08-24 DIAGNOSIS — W1839XA Other fall on same level, initial encounter: Secondary | ICD-10-CM | POA: Diagnosis not present

## 2019-08-24 DIAGNOSIS — Z96641 Presence of right artificial hip joint: Secondary | ICD-10-CM | POA: Diagnosis not present

## 2019-08-25 DIAGNOSIS — M25651 Stiffness of right hip, not elsewhere classified: Secondary | ICD-10-CM | POA: Diagnosis not present

## 2019-08-25 DIAGNOSIS — R278 Other lack of coordination: Secondary | ICD-10-CM | POA: Diagnosis not present

## 2019-08-25 DIAGNOSIS — M25551 Pain in right hip: Secondary | ICD-10-CM | POA: Diagnosis not present

## 2019-08-25 DIAGNOSIS — R262 Difficulty in walking, not elsewhere classified: Secondary | ICD-10-CM | POA: Diagnosis not present

## 2019-08-27 DIAGNOSIS — M25551 Pain in right hip: Secondary | ICD-10-CM | POA: Diagnosis not present

## 2019-08-27 DIAGNOSIS — R278 Other lack of coordination: Secondary | ICD-10-CM | POA: Diagnosis not present

## 2019-08-27 DIAGNOSIS — R262 Difficulty in walking, not elsewhere classified: Secondary | ICD-10-CM | POA: Diagnosis not present

## 2019-08-27 DIAGNOSIS — M25651 Stiffness of right hip, not elsewhere classified: Secondary | ICD-10-CM | POA: Diagnosis not present

## 2019-08-31 ENCOUNTER — Other Ambulatory Visit: Payer: Self-pay | Admitting: Internal Medicine

## 2019-08-31 MED ORDER — AMLODIPINE BESYLATE 5 MG PO TABS: 5.0000 mg | ORAL_TABLET | Freq: Every day | ORAL | 3 refills | Status: DC

## 2019-08-31 NOTE — Telephone Encounter (Signed)
Medication Refill - Medication:   amLODipine (NORVASC) 5 MG tablet [id number]   Has the patient contacted their pharmacy? No. (Agent: If no, request that the patient contact the pharmacy for the refill.) (Agent: If yes, when and what did the pharmacy advise?)  Preferred Pharmacy (with phone number or street name):  [facility] [id number], [place] [phone] (Phone)     Agent: Please be advised that RX refills may take up to 3 business days. We ask that you follow-up with your pharmacy.

## 2019-09-02 DIAGNOSIS — M25651 Stiffness of right hip, not elsewhere classified: Secondary | ICD-10-CM | POA: Diagnosis not present

## 2019-09-02 DIAGNOSIS — R278 Other lack of coordination: Secondary | ICD-10-CM | POA: Diagnosis not present

## 2019-09-02 DIAGNOSIS — R262 Difficulty in walking, not elsewhere classified: Secondary | ICD-10-CM | POA: Diagnosis not present

## 2019-09-02 DIAGNOSIS — M25551 Pain in right hip: Secondary | ICD-10-CM | POA: Diagnosis not present

## 2019-09-03 ENCOUNTER — Ambulatory Visit: Payer: Medicare Other

## 2019-09-03 ENCOUNTER — Other Ambulatory Visit: Payer: Self-pay

## 2019-09-07 ENCOUNTER — Ambulatory Visit (INDEPENDENT_AMBULATORY_CARE_PROVIDER_SITE_OTHER): Payer: Medicare Other | Admitting: *Deleted

## 2019-09-07 ENCOUNTER — Other Ambulatory Visit: Payer: Self-pay

## 2019-09-07 DIAGNOSIS — E538 Deficiency of other specified B group vitamins: Secondary | ICD-10-CM | POA: Diagnosis not present

## 2019-09-07 MED ORDER — CYANOCOBALAMIN 1000 MCG/ML IJ SOLN
1000.0000 ug | Freq: Once | INTRAMUSCULAR | Status: AC
  Administered 2019-09-07: 1000 ug via INTRAMUSCULAR

## 2019-09-07 NOTE — Progress Notes (Addendum)
Patient presented for B 12 injection to left deltoid, patient voiced no concerns nor showed any signs of distress during injection.  Reviewed.  Dr Scott 

## 2019-09-09 DIAGNOSIS — M25651 Stiffness of right hip, not elsewhere classified: Secondary | ICD-10-CM | POA: Diagnosis not present

## 2019-09-09 DIAGNOSIS — M25551 Pain in right hip: Secondary | ICD-10-CM | POA: Diagnosis not present

## 2019-09-09 DIAGNOSIS — R262 Difficulty in walking, not elsewhere classified: Secondary | ICD-10-CM | POA: Diagnosis not present

## 2019-09-09 DIAGNOSIS — R278 Other lack of coordination: Secondary | ICD-10-CM | POA: Diagnosis not present

## 2019-09-10 ENCOUNTER — Other Ambulatory Visit: Payer: Self-pay

## 2019-09-10 ENCOUNTER — Encounter: Payer: Self-pay | Admitting: Podiatry

## 2019-09-10 ENCOUNTER — Ambulatory Visit (INDEPENDENT_AMBULATORY_CARE_PROVIDER_SITE_OTHER): Payer: Medicare Other | Admitting: Podiatry

## 2019-09-10 DIAGNOSIS — B351 Tinea unguium: Secondary | ICD-10-CM | POA: Diagnosis not present

## 2019-09-10 DIAGNOSIS — E1142 Type 2 diabetes mellitus with diabetic polyneuropathy: Secondary | ICD-10-CM | POA: Diagnosis not present

## 2019-09-10 DIAGNOSIS — Q828 Other specified congenital malformations of skin: Secondary | ICD-10-CM

## 2019-09-10 DIAGNOSIS — M79674 Pain in right toe(s): Secondary | ICD-10-CM

## 2019-09-10 DIAGNOSIS — M79675 Pain in left toe(s): Secondary | ICD-10-CM

## 2019-09-10 DIAGNOSIS — E1140 Type 2 diabetes mellitus with diabetic neuropathy, unspecified: Secondary | ICD-10-CM | POA: Insufficient documentation

## 2019-09-10 NOTE — Progress Notes (Signed)
Complaint:  Visit Type: Patient presents to the office for  preventative foot care services. Complaint: Patient states" my nails have grown long and thick and become painful to walk and wear shoes" Patient has been diagnosed with DM and does not take medication for diabetes.. The patient presents for preventative foot care services. No changes to ROS.    Podiatric Exam: Vascular: dorsalis pedis and posterior tibial pulses are palpable bilateral. Capillary return is immediate. Temperature gradient is WNL. Skin turgor WNL  Sensorium: Diminished  Semmes Weinstein monofilament test. Normal tactile sensation bilaterally. Nail Exam: Pt has thick disfigured discolored nails with subungual debris noted bilateral entire nail hallux through fifth toenails Ulcer Exam: There is no evidence of ulcer or pre-ulcerative changes or infection. Orthopedic Exam: Muscle tone and strength are WNL. No limitations in general ROM. No crepitus or effusions noted. Foot type and digits show no abnormalities. Plantar flex fifth met left foot. Skin:  Porokeratosis sub 5th met left foot asymptomatic. No infection or ulcers  Diagnosis:  Onychomycosis, , Pain in right toe, pain in left toes  Treatment & Plan Procedures and Treatment: Consent by patient was obtained for treatment procedures.   Debridement of mycotic and hypertrophic toenails, 1 through 5 bilateral and clearing of subungual debris. No ulceration, no infection noted. Return Visit-Office Procedure: Patient instructed to return to the office for a follow up visit 3 months for continued evaluation and treatment.    Gardiner Barefoot DPM

## 2019-09-11 DIAGNOSIS — E119 Type 2 diabetes mellitus without complications: Secondary | ICD-10-CM | POA: Diagnosis not present

## 2019-09-11 LAB — HM DIABETES EYE EXAM

## 2019-09-14 ENCOUNTER — Encounter: Payer: Self-pay | Admitting: Urology

## 2019-09-14 ENCOUNTER — Other Ambulatory Visit: Payer: Self-pay

## 2019-09-14 ENCOUNTER — Ambulatory Visit (INDEPENDENT_AMBULATORY_CARE_PROVIDER_SITE_OTHER): Payer: Medicare Other | Admitting: Urology

## 2019-09-14 VITALS — BP 168/85 | HR 89 | Ht 71.0 in | Wt 207.0 lb

## 2019-09-14 DIAGNOSIS — N401 Enlarged prostate with lower urinary tract symptoms: Secondary | ICD-10-CM | POA: Diagnosis not present

## 2019-09-14 LAB — BLADDER SCAN AMB NON-IMAGING: Scan Result: 12

## 2019-09-14 MED ORDER — FINASTERIDE 5 MG PO TABS: 5.0000 mg | ORAL_TABLET | Freq: Every day | ORAL | 3 refills | Status: DC

## 2019-09-14 NOTE — Progress Notes (Signed)
   09/14/2019 4:27 PM   [name] 01/05/1926 [id number]  Reason for visit: Follow up BPH/LUTS  HPI: I saw [name] back in urology clinic to discuss his urinary symptoms.  He has a long history of mild to moderate urinary symptoms currently well controlled on maximal medical therapy with Flomax and finasteride.  His primary complaint is nocturia 2-3 times per night, but he denies any significant complaints during the day.  He is emptying his bladder well with a PVR of 12 mL in clinic today.  He denies any gross hematuria, dysuria, flank pain, or urinary retention.  He would like to continue maximal medical therapy and his medications were refilled.  He will follow-up in 1 year for repeat PVR and symptom check, sooner if any problems.  A total of 15 minutes were spent face-to-face with the patient, greater than 50% was spent in patient education, counseling, and coordination of care regarding BPH and urinary symptoms.  Billey Co, [facility] [address] [place], [place] [address] [phone]

## 2019-09-22 DIAGNOSIS — M25551 Pain in right hip: Secondary | ICD-10-CM | POA: Diagnosis not present

## 2019-09-22 DIAGNOSIS — M25651 Stiffness of right hip, not elsewhere classified: Secondary | ICD-10-CM | POA: Diagnosis not present

## 2019-09-22 DIAGNOSIS — R262 Difficulty in walking, not elsewhere classified: Secondary | ICD-10-CM | POA: Diagnosis not present

## 2019-09-22 DIAGNOSIS — R278 Other lack of coordination: Secondary | ICD-10-CM | POA: Diagnosis not present

## 2019-09-23 ENCOUNTER — Telehealth: Payer: Self-pay | Admitting: Urology

## 2019-09-23 ENCOUNTER — Ambulatory Visit (INDEPENDENT_AMBULATORY_CARE_PROVIDER_SITE_OTHER): Payer: Medicare Other | Admitting: Physician Assistant

## 2019-09-23 ENCOUNTER — Other Ambulatory Visit: Payer: Self-pay

## 2019-09-23 ENCOUNTER — Encounter: Payer: Self-pay | Admitting: Physician Assistant

## 2019-09-23 ENCOUNTER — Other Ambulatory Visit: Payer: Self-pay | Admitting: Physician Assistant

## 2019-09-23 VITALS — BP 150/64 | HR 78 | Wt 214.0 lb

## 2019-09-23 DIAGNOSIS — R310 Gross hematuria: Secondary | ICD-10-CM | POA: Diagnosis not present

## 2019-09-23 DIAGNOSIS — R300 Dysuria: Secondary | ICD-10-CM

## 2019-09-23 DIAGNOSIS — N3001 Acute cystitis with hematuria: Secondary | ICD-10-CM | POA: Diagnosis not present

## 2019-09-23 LAB — URINALYSIS, COMPLETE
Bilirubin, UA: NEGATIVE
Glucose, UA: NEGATIVE
Ketones, UA: NEGATIVE
Nitrite, UA: NEGATIVE
Specific Gravity, UA: 1.015 (ref 1.005–1.030)
Urobilinogen, Ur: 0.2 mg/dL (ref 0.2–1.0)
pH, UA: 7 (ref 5.0–7.5)

## 2019-09-23 LAB — MICROSCOPIC EXAMINATION
Epithelial Cells (non renal): NONE SEEN /hpf (ref 0–10)
RBC, Urine: >30 /hpf — AB (ref 0–2)
WBC, UA: >30 /hpf — AB (ref 0–5)

## 2019-09-23 MED ORDER — NITROFURANTOIN MONOHYD MACRO 100 MG PO CAPS: 100.0000 mg | ORAL_CAPSULE | Freq: Two times a day (BID) | ORAL | 0 refills | Status: AC

## 2019-09-23 NOTE — Telephone Encounter (Signed)
Pt called office, states pt of [name], however he has had terrible burning with urination past couple of days and has blood tinted urine. Added pt to [facility] sched this a.m.   FYI

## 2019-09-23 NOTE — Progress Notes (Signed)
09/23/2019 9:32 AM   [name] 01-Dec-1925 [id number]  CC: Dysuria, gross hematuria  HPI: [name] is a 85 y.o. male who presents today for evaluation of possible UTI. He is an established [facility] patient who last saw Dr. Diamantina Providence on 09/14/2019 for follow-up of BPH with LUTS.  He reports a 2-day history of dysuria and gross hematuria. He denies fevers, chills, nausea, vomiting, and flank pain. He has taken cranberry juice at home with no symptom palliation.  In-office UA today positive for 3+ blood, 2+ protein, and 1+ leukocyte esterase; urine microscopy with >30 WBCs/HPF and >30 RBCs/HPF.  PMH: Past Medical History:  Diagnosis Date  . Allergic rhinitis   . BPH (benign prostatic hypertrophy)   . Degenerative arthritis   . Diabetes mellitus ([place])   . GERD (gastroesophageal reflux disease)   . HTN (hypertension)   . Hypercholesterolemia   . HYPERTENSION, BENIGN 08/15/2010   Qualifier: Diagnosis of  By: Rockey Situ MD, Tim    . Iron deficiency anemia   . Skin cancer   . TIA (transient ischemic attack) 09/13/2015  . Urinary outflow obstruction    mild    Surgical History: Past Surgical History:  Procedure Laterality Date  . PARTIAL HIP ARTHROPLASTY  01/2019  . PILONIDAL CYST EXCISION  1951   removal  . [place]  . SKIN CANCER EXCISION     multiple  . SQUAMOUS CELL CARCINOMA EXCISION     behind head  . TONSILLECTOMY AND ADENOIDECTOMY  1938    Home Medications:  Allergies as of 09/23/2019      Reactions   Magnesium Other (See Comments)   Myasthenia gravis   Penicillamine Other (See Comments)   Myasthenia gravis      Medication List       Accurate as of September 23, 2019  9:32 AM. If you have any questions, ask your nurse or doctor.        Accu-Chek FastClix Lancets Misc Use daily as directed to check sugars twice daily. DX E11.9   amLODipine 5 MG tablet Commonly known as: NORVASC Take 1 tablet (5 mg total) by mouth daily.  Aspir-Low 81 MG EC tablet Generic drug: aspirin Take 81 mg by mouth daily.   CellCept 500 MG tablet Generic drug: mycophenolate Take 2 tablets (1,000 mg total) by mouth 2 (two) times daily.   cyanocobalamin 1000 MCG/ML injection Commonly known as: (VITAMIN B-12) Inject 1,000 mcg into the muscle every 30 (thirty) days.   docusate sodium 100 MG capsule Commonly known as: COLACE Take by mouth.   finasteride 5 MG tablet Commonly known as: PROSCAR Take 1 tablet (5 mg total) by mouth daily.   glucose blood test strip Commonly known as: Accu-Chek Guide Use twice daily to check blood sugars Dx E11.9   lovastatin 20 MG tablet Commonly known as: MEVACOR Take 1 tablet (20 mg total) by mouth at bedtime.   metFORMIN 500 MG 24 hr tablet Commonly known as: GLUCOPHAGE-XR TAKE 1 TABLET BY MOUTH ONCE A DAY WITH BREAKFAST   olmesartan 40 MG tablet Commonly known as: Benicar Take 1 tablet (40 mg total) by mouth daily.   Oscal 500/200 D-3 500-200 MG-UNIT tablet Generic drug: calcium-vitamin D Take 1 tablet by mouth 2 (two) times daily.   tamsulosin 0.4 MG Caps capsule Commonly known as: FLOMAX Take 1 capsule (0.4 mg total) by mouth daily after supper.       Allergies:  Allergies  Allergen Reactions  . Magnesium  Other (See Comments)    Myasthenia gravis  . Penicillamine Other (See Comments)    Myasthenia gravis    Family History: Family History  Problem Relation Age of Onset  . Heart disease Father        heart atack  . Alzheimer's disease Mother   . CVA Brother     Social History:   reports that he has never smoked. He has never used smokeless tobacco. He reports that he does not drink alcohol or use drugs.  ROS: UROLOGY Frequent Urination?: No Hard to postpone urination?: No Burning/pain with urination?: Yes Get up at night to urinate?: Yes Leakage of urine?: No Urine stream starts and stops?: No Trouble starting stream?: No Do you have to strain to urinate?:  No Blood in urine?: Yes Urinary tract infection?: Yes Sexually transmitted disease?: No Injury to kidneys or bladder?: No Painful intercourse?: No Weak stream?: No Erection problems?: No Penile pain?: No  Gastrointestinal Nausea?: No Vomiting?: No Indigestion/heartburn?: No Diarrhea?: No Constipation?: No  Constitutional Fever: No Night sweats?: No Weight loss?: No Fatigue?: No  Skin Skin rash/lesions?: No Itching?: No  Eyes Blurred vision?: No Double vision?: No  Ears/Nose/Throat Sore throat?: No Sinus problems?: No  Hematologic/Lymphatic Swollen glands?: No Easy bruising?: No  Cardiovascular Leg swelling?: No Chest pain?: No  Respiratory Cough?: No Shortness of breath?: No  Endocrine Excessive thirst?: No  Musculoskeletal Back pain?: No Joint pain?: No  Neurological Headaches?: No Dizziness?: No  Psychologic Depression?: No Anxiety?: No  Physical Exam: BP (!) 150/64   Pulse 78   Wt 214 lb (97.1 kg)   BMI 29.85 kg/m   Constitutional:  Alert and oriented, no acute distress, nontoxic appearing HEENT: [place], AT Cardiovascular: No clubbing, cyanosis, or edema Respiratory: Normal respiratory effort, no increased work of breathing Skin: No rashes, bruises or suspicious lesions Neurologic: Grossly intact, no focal deficits, moving all 4 extremities, walks with cane Psychiatric: Normal mood and affect  Laboratory Data: Results for orders placed or performed in visit on 09/23/19  Microscopic Examination   URINE  Result Value Ref Range   WBC, UA >30 (A) 0 - 5 /hpf   RBC >30 (A) 0 - 2 /hpf   Epithelial Cells (non renal) None seen 0 - 10 /hpf   Bacteria, UA Few None seen/Few  Urinalysis, Complete  Result Value Ref Range   Specific Gravity, UA 1.015 1.005 - 1.030   pH, UA 7.0 5.0 - 7.5   Color, UA Red (A) Yellow   Appearance Ur Cloudy (A) Clear   Leukocytes,UA 1+ (A) Negative   Protein,UA 2+ (A) Negative/Trace   Glucose, UA Negative  Negative   Ketones, UA Negative Negative   RBC, UA 3+ (A) Negative   Bilirubin, UA Negative Negative   Urobilinogen, Ur 0.2 0.2 - 1.0 mg/dL   Nitrite, UA Negative Negative   Microscopic Examination See below:    Assessment & Plan:   1. Dysuria Patient with a 2-day history of irritative voiding symptoms, with pyuria and microscopic hematuria on UA.  Will send for culture. - Urinalysis, Complete - CULTURE, URINE COMPREHENSIVE  2. Acute cystitis with hematuria Starting patient on Macrobid 100 mg twice daily x7 days today.  Counseled him that I would contact him if I need to adjust therapy per culture results.  He will need to follow-up in clinic in a week to prove resolution of hematuria following treatment. - nitrofurantoin, macrocrystal-monohydrate, (MACROBID) 100 MG capsule; Take 1 capsule (100 mg total) by mouth  every 12 (twelve) hours for 7 days.  Dispense: 14 capsule; Refill: 0   Return in about 1 week (around 09/30/2019) for Lab visit for UA.  Debroah Loop, PA-C  [facility] [address], [place], [place] [address] [phone]

## 2019-09-24 ENCOUNTER — Encounter: Payer: Self-pay | Admitting: Internal Medicine

## 2019-09-24 ENCOUNTER — Other Ambulatory Visit: Payer: Self-pay | Admitting: Internal Medicine

## 2019-09-24 DIAGNOSIS — I10 Essential (primary) hypertension: Secondary | ICD-10-CM

## 2019-09-24 NOTE — Telephone Encounter (Signed)
Copied from [place] [id number]. Topic: Quick Communication - Rx Refill/Question >> Sep 24, 2019  4:11 PM Leward Quan A wrote: Medication: olmesartan (BENICAR) 40 MG tablet   Per patient only have 2 pills left   Has the patient contacted their pharmacy? Yes.   (Agent: If no, request that the patient contact the pharmacy for the refill.) (Agent: If yes, when and what did the pharmacy advise?)  Preferred Pharmacy (with phone number or street name): [facility] [id number], [place] [phone] (Phone) [phone] (Fax)    Agent: Please be advised that RX refills may take up to 3 business days. We ask that you follow-up with your pharmacy.

## 2019-09-24 NOTE — Telephone Encounter (Signed)
Requested medication (s) are due for refill today: yes  Requested medication (s) are on the active medication list: yes  Last refill:  01/20/19  Future visit scheduled: yes  Notes to clinic:  Angiotensin receptor blockers failed.  Requested Prescriptions  Pending Prescriptions Disp Refills   olmesartan (BENICAR) 40 MG tablet 30 tablet 2    Sig: Take 1 tablet (40 mg total) by mouth daily.     Cardiovascular:  Angiotensin Receptor Blockers Failed - 09/24/2019  4:39 PM      Failed - Last BP in normal range    BP Readings from Last 1 Encounters:  09/23/19 (!) 150/64         Passed - Cr in normal range and within 180 days    Creatinine, Ser  Date Value Ref Range Status  07/23/2019 0.71 0.40 - 1.50 mg/dL Final         Passed - K in normal range and within 180 days    Potassium  Date Value Ref Range Status  07/23/2019 4.0 3.5 - 5.1 mEq/L Final         Passed - Patient is not pregnant      Passed - Valid encounter within last 6 months    Recent Outpatient Visits          2 months ago Type 2 diabetes mellitus without complication, without long-term current use of insulin ([place])   [facility] [place], [place], MD   6 months ago Anemia, unspecified type   [facility] [place], Randell Patient, MD   10 months ago Anemia, unspecified type   [facility], Randell Patient, MD   1 year ago Encounter for immunization   [facility] Einar Pheasant, MD   1 year ago Anemia, unspecified type   [facility], MD      Future Appointments            In 4 weeks Einar Pheasant, MD [facility], [place]   In 8 months [place], Bryson Corona, LPN [place], [place]   In 12 months [place], Herbert Seta, [facility]

## 2019-09-25 ENCOUNTER — Other Ambulatory Visit: Payer: Self-pay | Admitting: Physician Assistant

## 2019-09-25 LAB — SPECIMEN STATUS REPORT

## 2019-09-25 LAB — URINE CULTURE

## 2019-09-28 ENCOUNTER — Other Ambulatory Visit: Payer: Self-pay | Admitting: Internal Medicine

## 2019-09-28 DIAGNOSIS — E119 Type 2 diabetes mellitus without complications: Secondary | ICD-10-CM

## 2019-09-28 MED ORDER — METFORMIN HCL ER 500 MG PO TB24: ORAL_TABLET | ORAL | 0 refills | Status: DC

## 2019-09-28 NOTE — Telephone Encounter (Signed)
Medication Refill - Medication: metFORMIN (GLUCOPHAGE-XR) 500 MG 24 hr tablet [id number]     Preferred Pharmacy (with phone number or street name):  [facility] [id number], [place] [address]  [place] [address]  Phone: [phone] Fax: [phone]     Agent: Please be advised that RX refills may take up to 3 business days. We ask that you follow-up with your pharmacy.

## 2019-09-28 NOTE — Telephone Encounter (Signed)
Pt called in to follow up on his refill request for Olmesartan. Pt says that he is completely out of his medication. Please advise.

## 2019-09-29 ENCOUNTER — Other Ambulatory Visit: Payer: Self-pay | Admitting: *Deleted

## 2019-09-29 DIAGNOSIS — R300 Dysuria: Secondary | ICD-10-CM

## 2019-09-30 ENCOUNTER — Other Ambulatory Visit: Payer: Medicare Other

## 2019-09-30 ENCOUNTER — Telehealth: Payer: Self-pay | Admitting: Family Medicine

## 2019-09-30 ENCOUNTER — Telehealth: Payer: Self-pay | Admitting: Internal Medicine

## 2019-09-30 ENCOUNTER — Other Ambulatory Visit: Payer: Self-pay

## 2019-09-30 DIAGNOSIS — R300 Dysuria: Secondary | ICD-10-CM | POA: Diagnosis not present

## 2019-09-30 LAB — URINALYSIS, COMPLETE
Bilirubin, UA: NEGATIVE
Glucose, UA: NEGATIVE
Leukocytes,UA: NEGATIVE
Nitrite, UA: NEGATIVE
RBC, UA: NEGATIVE
Specific Gravity, UA: 1.025 (ref 1.005–1.030)
Urobilinogen, Ur: 1 mg/dL (ref 0.2–1.0)
pH, UA: 6 (ref 5.0–7.5)

## 2019-09-30 LAB — MICROSCOPIC EXAMINATION
Bacteria, UA: NONE SEEN
Epithelial Cells (non renal): NONE SEEN /hpf (ref 0–10)
RBC, Urine: NONE SEEN /hpf (ref 0–2)

## 2019-09-30 MED ORDER — OLMESARTAN MEDOXOMIL 40 MG PO TABS: 40.0000 mg | ORAL_TABLET | Freq: Every day | ORAL | 2 refills | Status: DC

## 2019-09-30 NOTE — Telephone Encounter (Signed)
Patient notified and voiced understanding.

## 2019-09-30 NOTE — Telephone Encounter (Signed)
-----   Message from Nori Riis, PA-C sent at 09/30/2019 11:56 AM EST ----- Please let [name] know that his urine was clear of blood.

## 2019-10-06 ENCOUNTER — Other Ambulatory Visit: Payer: Self-pay

## 2019-10-06 DIAGNOSIS — I10 Essential (primary) hypertension: Secondary | ICD-10-CM

## 2019-10-06 MED ORDER — OLMESARTAN MEDOXOMIL 40 MG PO TABS: 40.0000 mg | ORAL_TABLET | Freq: Every day | ORAL | 2 refills | Status: DC

## 2019-10-08 ENCOUNTER — Ambulatory Visit: Payer: Medicare Other

## 2019-10-15 ENCOUNTER — Other Ambulatory Visit: Payer: Self-pay | Admitting: Urology

## 2019-10-15 DIAGNOSIS — R278 Other lack of coordination: Secondary | ICD-10-CM | POA: Diagnosis not present

## 2019-10-15 DIAGNOSIS — M25651 Stiffness of right hip, not elsewhere classified: Secondary | ICD-10-CM | POA: Diagnosis not present

## 2019-10-15 DIAGNOSIS — M25551 Pain in right hip: Secondary | ICD-10-CM | POA: Diagnosis not present

## 2019-10-15 DIAGNOSIS — N401 Enlarged prostate with lower urinary tract symptoms: Secondary | ICD-10-CM

## 2019-10-15 DIAGNOSIS — R262 Difficulty in walking, not elsewhere classified: Secondary | ICD-10-CM | POA: Diagnosis not present

## 2019-10-22 ENCOUNTER — Telehealth: Payer: Self-pay | Admitting: Internal Medicine

## 2019-10-22 ENCOUNTER — Other Ambulatory Visit: Payer: Self-pay

## 2019-10-22 ENCOUNTER — Ambulatory Visit (INDEPENDENT_AMBULATORY_CARE_PROVIDER_SITE_OTHER): Payer: Medicare Other

## 2019-10-22 ENCOUNTER — Ambulatory Visit (INDEPENDENT_AMBULATORY_CARE_PROVIDER_SITE_OTHER): Payer: Medicare Other | Admitting: Internal Medicine

## 2019-10-22 DIAGNOSIS — E538 Deficiency of other specified B group vitamins: Secondary | ICD-10-CM

## 2019-10-22 DIAGNOSIS — G7000 Myasthenia gravis without (acute) exacerbation: Secondary | ICD-10-CM

## 2019-10-22 DIAGNOSIS — E119 Type 2 diabetes mellitus without complications: Secondary | ICD-10-CM

## 2019-10-22 DIAGNOSIS — D649 Anemia, unspecified: Secondary | ICD-10-CM

## 2019-10-22 DIAGNOSIS — N400 Enlarged prostate without lower urinary tract symptoms: Secondary | ICD-10-CM

## 2019-10-22 DIAGNOSIS — E7800 Pure hypercholesterolemia, unspecified: Secondary | ICD-10-CM

## 2019-10-22 DIAGNOSIS — E871 Hypo-osmolality and hyponatremia: Secondary | ICD-10-CM

## 2019-10-22 DIAGNOSIS — I10 Essential (primary) hypertension: Secondary | ICD-10-CM

## 2019-10-22 MED ORDER — CYANOCOBALAMIN 1000 MCG/ML IJ SOLN
1000.0000 ug | Freq: Once | INTRAMUSCULAR | Status: AC
  Administered 2019-10-22: 14:00:00 1000 ug via INTRAMUSCULAR

## 2019-10-22 NOTE — Telephone Encounter (Signed)
No,  Dr. Nicki Reaper this is no problem I will check his against our manuel  BP and see what we get.  Tanyla Stege,cma

## 2019-10-22 NOTE — Progress Notes (Signed)
Patient ID: [name], male   DOB: 1926/01/31, 85 y.o.   MRN: [id number]   Virtual Visit via telephone Note  This visit type was conducted due to national recommendations for restrictions regarding the COVID-19 pandemic (e.g. social distancing).  This format is felt to be most appropriate for this patient at this time.  All issues noted in this document were discussed and addressed.  No physical exam was performed (except for noted visual exam findings with Video Visits).   I connected with [name] by and verified that I am speaking with the correct person using two identifiers. Location patient: home Location provider: work  Persons participating in the telephone visit: patient, provider  The limitations, risks, security and privacy concerns of performing an evaluation and management service by telephone and the availability of in person appointments have been discussed. The patient expressed understanding and agreed to proceed.   Reason for visit: scheduled follow up.   HPI: Overall he reports he is doing relatively well.  Was recently evaluated by urology for dysuria and hematuria.  Treated with macrobid.  Symptoms resolved.  Continues on flomax and finasteride.  Saw podiatry 09/20/19.  Is s/p femoral neck fracture.  Went to physical therapy.  Stopped now.  Hip is still sore, but getting around well.  No chest pain or sob reported.  Eating well.  No abdominal pain reported.  Taking senna s and prune juice - keeps bowels moving.  A1c 7.2 - 07/2019.     ROS: See pertinent positives and negatives per HPI.  Past Medical History:  Diagnosis Date  . Allergic rhinitis   . BPH (benign prostatic hypertrophy)   . Degenerative arthritis   . Diabetes mellitus ([place])   . GERD (gastroesophageal reflux disease)   . HTN (hypertension)   . Hypercholesterolemia   . HYPERTENSION, BENIGN 08/15/2010   Qualifier: Diagnosis of  By: Rockey Situ MD, Tim    . Iron deficiency anemia   . Skin  cancer   . TIA (transient ischemic attack) 09/13/2015  . Urinary outflow obstruction    mild    Past Surgical History:  Procedure Laterality Date  . PARTIAL HIP ARTHROPLASTY  01/2019  . PILONIDAL CYST EXCISION  1951   removal  . [place]  . SKIN CANCER EXCISION     multiple  . SQUAMOUS CELL CARCINOMA EXCISION     behind head  . TONSILLECTOMY AND ADENOIDECTOMY  1938    Family History  Problem Relation Age of Onset  . Heart disease Father        heart atack  . Alzheimer's disease Mother   . CVA Brother     SOCIAL HX: reviewed.    Current Outpatient Medications:  .  ACCU-CHEK FASTCLIX LANCETS MISC, Use daily as directed to check sugars twice daily. DX E11.9, Disp: 102 each, Rfl: 12 .  amLODipine (NORVASC) 5 MG tablet, Take 1 tablet (5 mg total) by mouth daily., Disp: 30 tablet, Rfl: 3 .  aspirin (ASPIR-LOW) 81 MG EC tablet, Take 81 mg by mouth daily.  , Disp: , Rfl:  .  calcium-vitamin D (OSCAL 500/200 D-3) 500-200 MG-UNIT per tablet, Take 1 tablet by mouth 2 (two) times daily. , Disp: , Rfl:  .  cyanocobalamin (,VITAMIN B-12,) 1000 MCG/ML injection, Inject 1,000 mcg into the muscle every 30 (thirty) days., Disp: , Rfl:  .  docusate sodium (COLACE) 100 MG capsule, Take by mouth., Disp: , Rfl:  .  finasteride (PROSCAR) 5 MG  tablet, Take 1 tablet (5 mg total) by mouth daily., Disp: 90 tablet, Rfl: 3 .  glucose blood (ACCU-CHEK GUIDE) test strip, Use twice daily to check blood sugars Dx E11.9, Disp: 100 each, Rfl: 12 .  lovastatin (MEVACOR) 20 MG tablet, Take 1 tablet (20 mg total) by mouth at bedtime., Disp: 90 tablet, Rfl: 1 .  metFORMIN (GLUCOPHAGE-XR) 500 MG 24 hr tablet, TAKE 1 TABLET BY MOUTH ONCE A DAY WITH BREAKFAST, Disp: 90 tablet, Rfl: 0 .  mycophenolate (CELLCEPT) 500 MG tablet, Take 2 tablets (1,000 mg total) by mouth 2 (two) times daily., Disp: , Rfl:  .  olmesartan (BENICAR) 40 MG tablet, Take 1 tablet (40 mg total) by mouth daily., Disp: 30 tablet,  Rfl: 2 .  tamsulosin (FLOMAX) 0.4 MG CAPS capsule, TAKE 1 CAPSULE(0.4 MG) BY MOUTH DAILY AFTER SUPPER, Disp: 90 capsule, Rfl: 3  EXAM:  GENERAL: alert. Sounds to be in no acute distress.  Answering questions appropriately.    PSYCH/NEURO: pleasant and cooperative, no obvious depression or anxiety, speech and thought processing grossly intact  ASSESSMENT AND PLAN:  Discussed the following assessment and plan:  Anemia Follow cbc.  Last hgb 12.3.    B12 deficiency Continue b12 injection.    BPH (benign prostatic hyperplasia) Followed by urology.    Diabetes mellitus Low carb diet and exercise.  Follow met b and a1c.  Last a1c 7.2.    Hypercholesterolemia On lovastatin.  Low cholesterol diet and exercise.  Follow lipid panel and liver function tests.    HYPERTENSION, BENIGN Blood pressure under good control.  Continue same medication regimen.  Follow pressures.  Follow metabolic panel.  He will bring his cuff with him when gets B12 checked to confirm cuff correlates.    Hyponatremia Off hctz.  Recent sodium wnl.   Myasthenia gravis ([place]) On cellcept.  Seeing neurology.  Stable.      I discussed the assessment and treatment plan with the patient. The patient was provided an opportunity to ask questions and all were answered. The patient agreed with the plan and demonstrated an understanding of the instructions.   The patient was advised to call back or seek an in-person evaluation if the symptoms worsen or if the condition fails to improve as anticipated.  I provided 23 minutes of non-face-to-face time during this encounter.   Einar Pheasant, MD

## 2019-10-22 NOTE — Telephone Encounter (Signed)
Mr Srader is coming in for a B12 injection today.  He has not been having any problems with his blood pressure, but he wants to bring his cuff in and see if it correlates with our machine.  Juliann Pulse told me to let you know he needed this done.  Let me know if this is a problem.  Thanks    Dr Nicki Reaper

## 2019-10-22 NOTE — Telephone Encounter (Signed)
Thank you.    Dr Irl Bodie 

## 2019-10-22 NOTE — Progress Notes (Addendum)
[name] presents today for injection per MD orders. B12 injection  administered IM in left Upper Arm. Administration without incident. Patient tolerated well.  [name],cma   Reviewed.  Dr Nicki Reaper

## 2019-10-25 ENCOUNTER — Encounter: Payer: Self-pay | Admitting: Internal Medicine

## 2019-10-25 NOTE — Assessment & Plan Note (Signed)
Follow cbc.  Last hgb 12.3.

## 2019-10-25 NOTE — Assessment & Plan Note (Signed)
On lovastatin.  Low cholesterol diet and exercise.  Follow lipid panel and liver function tests.   

## 2019-10-25 NOTE — Assessment & Plan Note (Signed)
Off hctz.  Recent sodium wnl.

## 2019-10-25 NOTE — Assessment & Plan Note (Signed)
Continue b12 injection.

## 2019-10-25 NOTE — Assessment & Plan Note (Addendum)
Blood pressure under good control.  Continue same medication regimen.  Follow pressures.  Follow metabolic panel.  He will bring his cuff with him when gets B12 checked to confirm cuff correlates.

## 2019-10-25 NOTE — Assessment & Plan Note (Signed)
Low carb diet and exercise.  Follow met b and a1c.  Last a1c 7.2.

## 2019-10-25 NOTE — Assessment & Plan Note (Signed)
On cellcept.  Seeing neurology.  Stable.

## 2019-10-25 NOTE — Assessment & Plan Note (Signed)
Followed by urology.   

## 2019-11-03 ENCOUNTER — Telehealth: Payer: Self-pay | Admitting: Internal Medicine

## 2019-11-10 ENCOUNTER — Other Ambulatory Visit: Payer: Self-pay

## 2019-11-10 ENCOUNTER — Other Ambulatory Visit: Payer: Self-pay | Admitting: Internal Medicine

## 2019-11-10 MED ORDER — ACCU-CHEK FASTCLIX LANCETS MISC: 6 refills | Status: DC

## 2019-11-10 MED ORDER — ACCU-CHEK GUIDE VI STRP: ORAL_STRIP | 6 refills | Status: DC

## 2019-11-10 NOTE — Telephone Encounter (Signed)
Rx resent with diagnosis code as requested.

## 2019-11-10 NOTE — Telephone Encounter (Signed)
Pt states he did not get the strips or lancets.  Please advise and Thank you!  Pharmacy is [place], [place] - [address]  Call pt @ [phone].

## 2019-11-12 ENCOUNTER — Telehealth: Payer: Self-pay | Admitting: Internal Medicine

## 2019-11-12 NOTE — Telephone Encounter (Signed)
See other note

## 2019-11-12 NOTE — Telephone Encounter (Signed)
Pt called wanting to speak to Larena Glassman about his diabetic supplies

## 2019-11-12 NOTE — Telephone Encounter (Signed)
Spoke with patient to let him know that I have resent rx with dx code and reached out to [facility] to clarify why they cannot fill. Unable to reach pharmacist at [place].

## 2019-11-12 NOTE — Telephone Encounter (Signed)
Pt called and said that [facility] says they did not receive prescription on 11/10/2019. Please resend

## 2019-11-13 NOTE — Telephone Encounter (Signed)
Spoke with pharmacy, they did not see anything wrong and confirmed that they received rx with dx code. I have let the pt know and they are supposed to call when ready

## 2019-11-16 NOTE — Telephone Encounter (Signed)
Pt called to check on the status of rx

## 2019-11-17 ENCOUNTER — Encounter: Payer: Self-pay | Admitting: Internal Medicine

## 2019-11-17 NOTE — Telephone Encounter (Signed)
See my chart message

## 2019-11-24 ENCOUNTER — Encounter: Payer: Self-pay | Admitting: Internal Medicine

## 2019-11-25 ENCOUNTER — Other Ambulatory Visit: Payer: Self-pay

## 2019-11-25 MED ORDER — ACCU-CHEK GUIDE VI STRP: ORAL_STRIP | 6 refills | Status: DC

## 2019-11-25 MED ORDER — ACCU-CHEK FASTCLIX LANCETS MISC: 6 refills | Status: DC

## 2019-11-27 ENCOUNTER — Telehealth: Payer: Self-pay | Admitting: Internal Medicine

## 2019-11-27 ENCOUNTER — Other Ambulatory Visit: Payer: Self-pay

## 2019-11-27 MED ORDER — AMLODIPINE BESYLATE 5 MG PO TABS: 5.0000 mg | ORAL_TABLET | Freq: Every day | ORAL | 3 refills | Status: DC

## 2019-11-27 NOTE — Telephone Encounter (Signed)
Rx refilled.

## 2019-11-27 NOTE — Telephone Encounter (Signed)
Pt states he needs a refill on amLODipine (NORVASC) 5 MG tablet.

## 2019-11-30 ENCOUNTER — Encounter: Payer: Self-pay | Admitting: Internal Medicine

## 2019-11-30 DIAGNOSIS — G7000 Myasthenia gravis without (acute) exacerbation: Secondary | ICD-10-CM | POA: Diagnosis not present

## 2019-11-30 NOTE — Telephone Encounter (Signed)
CBC already ordered

## 2019-12-03 ENCOUNTER — Ambulatory Visit (INDEPENDENT_AMBULATORY_CARE_PROVIDER_SITE_OTHER): Payer: Medicare Other

## 2019-12-03 ENCOUNTER — Other Ambulatory Visit: Payer: Self-pay

## 2019-12-03 ENCOUNTER — Other Ambulatory Visit (INDEPENDENT_AMBULATORY_CARE_PROVIDER_SITE_OTHER): Payer: Medicare Other

## 2019-12-03 DIAGNOSIS — E119 Type 2 diabetes mellitus without complications: Secondary | ICD-10-CM | POA: Diagnosis not present

## 2019-12-03 DIAGNOSIS — E7800 Pure hypercholesterolemia, unspecified: Secondary | ICD-10-CM

## 2019-12-03 DIAGNOSIS — D649 Anemia, unspecified: Secondary | ICD-10-CM | POA: Diagnosis not present

## 2019-12-03 DIAGNOSIS — I10 Essential (primary) hypertension: Secondary | ICD-10-CM

## 2019-12-03 DIAGNOSIS — E538 Deficiency of other specified B group vitamins: Secondary | ICD-10-CM

## 2019-12-03 LAB — LIPID PANEL
Cholesterol: 133 mg/dL (ref 0–200)
HDL: 51.6 mg/dL (ref >39.00)
LDL Cholesterol: 59 mg/dL (ref 0–99)
NonHDL: 81.14
Total CHOL/HDL Ratio: 3
Triglycerides: 109 mg/dL (ref 0.0–149.0)
VLDL: 21.8 mg/dL (ref 0.0–40.0)

## 2019-12-03 LAB — HEPATIC FUNCTION PANEL
ALT: 14 U/L (ref 0–53)
AST: 18 U/L (ref 0–37)
Albumin: 4 g/dL (ref 3.5–5.2)
Alkaline Phosphatase: 40 U/L (ref 39–117)
Bilirubin, Direct: 0.2 mg/dL (ref 0.0–0.3)
Total Bilirubin: 1 mg/dL (ref 0.2–1.2)
Total Protein: 7.2 g/dL (ref 6.0–8.3)

## 2019-12-03 LAB — BASIC METABOLIC PANEL
BUN: 15 mg/dL (ref 6–23)
CO2: 27 mEq/L (ref 19–32)
Calcium: 9.5 mg/dL (ref 8.4–10.5)
Chloride: 100 mEq/L (ref 96–112)
Creatinine, Ser: 0.75 mg/dL (ref 0.40–1.50)
GFR: 97.58 mL/min (ref >60.00)
Glucose, Bld: 147 mg/dL — ABNORMAL HIGH (ref 70–99)
Potassium: 4.5 mEq/L (ref 3.5–5.1)
Sodium: 135 mEq/L (ref 135–145)

## 2019-12-03 LAB — CBC WITH DIFFERENTIAL/PLATELET
Basophils Absolute: 0.1 10*3/uL (ref 0.0–0.1)
Basophils Relative: 1.1 % (ref 0.0–3.0)
Eosinophils Absolute: 0.2 10*3/uL (ref 0.0–0.7)
Eosinophils Relative: 2.2 % (ref 0.0–5.0)
HCT: 39.3 % (ref 39.0–52.0)
Hemoglobin: 12.9 g/dL — ABNORMAL LOW (ref 13.0–17.0)
Lymphocytes Relative: 21.3 % (ref 12.0–46.0)
Lymphs Abs: 1.5 10*3/uL (ref 0.7–4.0)
MCHC: 32.9 g/dL (ref 30.0–36.0)
MCV: 92.6 fl (ref 78.0–100.0)
Monocytes Absolute: 0.5 10*3/uL (ref 0.1–1.0)
Monocytes Relative: 7.2 % (ref 3.0–12.0)
Neutro Abs: 4.7 10*3/uL (ref 1.4–7.7)
Neutrophils Relative %: 68.2 % (ref 43.0–77.0)
Platelets: 306 10*3/uL (ref 150.0–400.0)
RBC: 4.24 Mil/uL (ref 4.22–5.81)
RDW: 13.7 % (ref 11.5–15.5)
WBC: 6.9 10*3/uL (ref 4.0–10.5)

## 2019-12-03 LAB — FERRITIN: Ferritin: 77 ng/mL (ref 22.0–322.0)

## 2019-12-03 LAB — HEMOGLOBIN A1C: Hgb A1c MFr Bld: 7 % — ABNORMAL HIGH (ref 4.6–6.5)

## 2019-12-03 MED ORDER — CYANOCOBALAMIN 1000 MCG/ML IJ SOLN
1000.0000 ug | Freq: Once | INTRAMUSCULAR | Status: AC
  Administered 2019-12-03: 1000 ug via INTRAMUSCULAR

## 2019-12-03 NOTE — Progress Notes (Addendum)
[name] presents today for injection per MD orders. B12 injection administered IM in left Upper Arm. Administration without incident. Patient tolerated well.  Cole Klugh,cma   Reviewed.  Dr Nicki Reaper

## 2019-12-05 ENCOUNTER — Encounter: Payer: Self-pay | Admitting: Internal Medicine

## 2019-12-10 ENCOUNTER — Ambulatory Visit (INDEPENDENT_AMBULATORY_CARE_PROVIDER_SITE_OTHER): Payer: Medicare Other | Admitting: Podiatry

## 2019-12-10 ENCOUNTER — Encounter: Payer: Self-pay | Admitting: Podiatry

## 2019-12-10 ENCOUNTER — Other Ambulatory Visit: Payer: Self-pay

## 2019-12-10 DIAGNOSIS — E1142 Type 2 diabetes mellitus with diabetic polyneuropathy: Secondary | ICD-10-CM

## 2019-12-10 DIAGNOSIS — M79675 Pain in left toe(s): Secondary | ICD-10-CM

## 2019-12-10 DIAGNOSIS — B351 Tinea unguium: Secondary | ICD-10-CM | POA: Diagnosis not present

## 2019-12-10 DIAGNOSIS — M79674 Pain in right toe(s): Secondary | ICD-10-CM | POA: Diagnosis not present

## 2019-12-10 NOTE — Progress Notes (Signed)
Complaint:  Visit Type: Patient presents to the office for  preventative foot care services. Complaint: Patient states" my nails have grown long and thick and become painful to walk and wear shoes" Patient has been diagnosed with DM and does not take medication for diabetes.. The patient presents for preventative foot care services. No changes to ROS.    Podiatric Exam: Vascular: dorsalis pedis and posterior tibial pulses are palpable bilateral. Capillary return is immediate. Temperature gradient is WNL. Skin turgor WNL  Sensorium: Diminished  Semmes Weinstein monofilament test. Normal tactile sensation bilaterally. Nail Exam: Pt has thick disfigured discolored nails with subungual debris noted bilateral entire nail hallux through fifth toenails Ulcer Exam: There is no evidence of ulcer or pre-ulcerative changes or infection. Orthopedic Exam: Muscle tone and strength are WNL. No limitations in general ROM. No crepitus or effusions noted. Foot type and digits show no abnormalities. Plantar flex fifth met left foot. Skin:  Porokeratosis sub 5th met left foot asymptomatic. No infection or ulcers  Diagnosis:  Onychomycosis, , Pain in right toe, pain in left toes  Treatment & Plan Procedures and Treatment: Consent by patient was obtained for treatment procedures.   Debridement of mycotic and hypertrophic toenails, 1 through 5 bilateral and clearing of subungual debris. No ulceration, no infection noted. Return Visit-Office Procedure: Patient instructed to return to the office for a follow up visit 3 months for continued evaluation and treatment.    Gardiner Barefoot DPM

## 2020-01-06 ENCOUNTER — Ambulatory Visit (INDEPENDENT_AMBULATORY_CARE_PROVIDER_SITE_OTHER): Payer: Medicare Other

## 2020-01-06 ENCOUNTER — Other Ambulatory Visit: Payer: Self-pay

## 2020-01-06 DIAGNOSIS — E538 Deficiency of other specified B group vitamins: Secondary | ICD-10-CM | POA: Diagnosis not present

## 2020-01-06 MED ORDER — CYANOCOBALAMIN 1000 MCG/ML IJ SOLN
1000.0000 ug | Freq: Once | INTRAMUSCULAR | Status: AC
  Administered 2020-01-06: 1000 ug via INTRAMUSCULAR

## 2020-01-06 NOTE — Progress Notes (Addendum)
Patient presented for B 12 injection to left deltoid, patient voiced no concerns nor showed any signs of distress during injection.  Reviewed.  Dr Scott 

## 2020-01-11 ENCOUNTER — Other Ambulatory Visit: Payer: Self-pay | Admitting: Urology

## 2020-01-11 DIAGNOSIS — N401 Enlarged prostate with lower urinary tract symptoms: Secondary | ICD-10-CM

## 2020-01-18 ENCOUNTER — Telehealth: Payer: Self-pay | Admitting: Internal Medicine

## 2020-01-18 DIAGNOSIS — I10 Essential (primary) hypertension: Secondary | ICD-10-CM

## 2020-01-18 DIAGNOSIS — E119 Type 2 diabetes mellitus without complications: Secondary | ICD-10-CM

## 2020-01-18 MED ORDER — LOVASTATIN 20 MG PO TABS: 20.0000 mg | ORAL_TABLET | Freq: Every day | ORAL | 1 refills | Status: DC

## 2020-01-18 MED ORDER — METFORMIN HCL ER 500 MG PO TB24: ORAL_TABLET | ORAL | 0 refills | Status: DC

## 2020-01-18 MED ORDER — OLMESARTAN MEDOXOMIL 40 MG PO TABS: 40.0000 mg | ORAL_TABLET | Freq: Every day | ORAL | 2 refills | Status: DC

## 2020-01-18 NOTE — Telephone Encounter (Signed)
Pt needs a refill on metFORMIN (GLUCOPHAGE-XR) 500 MG 24 hr tablet and olmesartan (BENICAR) 40 MG tablet and  lovastatin (MEVACOR) 20 MG tablet [facility] [place]--pt is out

## 2020-01-25 ENCOUNTER — Ambulatory Visit (INDEPENDENT_AMBULATORY_CARE_PROVIDER_SITE_OTHER): Payer: Medicare Other

## 2020-01-25 ENCOUNTER — Other Ambulatory Visit: Payer: Self-pay

## 2020-01-25 ENCOUNTER — Ambulatory Visit (INDEPENDENT_AMBULATORY_CARE_PROVIDER_SITE_OTHER): Payer: Medicare Other | Admitting: Internal Medicine

## 2020-01-25 DIAGNOSIS — R208 Other disturbances of skin sensation: Secondary | ICD-10-CM | POA: Diagnosis not present

## 2020-01-25 DIAGNOSIS — R05 Cough: Secondary | ICD-10-CM | POA: Diagnosis not present

## 2020-01-25 DIAGNOSIS — R059 Cough, unspecified: Secondary | ICD-10-CM

## 2020-01-25 DIAGNOSIS — S72001S Fracture of unspecified part of neck of right femur, sequela: Secondary | ICD-10-CM

## 2020-01-25 DIAGNOSIS — D649 Anemia, unspecified: Secondary | ICD-10-CM | POA: Diagnosis not present

## 2020-01-25 DIAGNOSIS — E7800 Pure hypercholesterolemia, unspecified: Secondary | ICD-10-CM | POA: Diagnosis not present

## 2020-01-25 DIAGNOSIS — G7000 Myasthenia gravis without (acute) exacerbation: Secondary | ICD-10-CM | POA: Diagnosis not present

## 2020-01-25 DIAGNOSIS — E1165 Type 2 diabetes mellitus with hyperglycemia: Secondary | ICD-10-CM | POA: Diagnosis not present

## 2020-01-25 DIAGNOSIS — E538 Deficiency of other specified B group vitamins: Secondary | ICD-10-CM

## 2020-01-25 DIAGNOSIS — I10 Essential (primary) hypertension: Secondary | ICD-10-CM | POA: Diagnosis not present

## 2020-01-25 NOTE — Progress Notes (Signed)
Patient ID: [name], male   DOB: June 02, 1926, 86 y.o.   MRN: [id number]   Subjective:    Patient ID: [name], male    DOB: 1926/11/03, 86 y.o.   MRN: [id number]  HPI  Patient here for a scheduled follow up.  He reports he is doing relatively well.  No falls recently.  Using a walker.  Blood sugars doing well.  No problems with low sugars.  Eating.  No nausea or vomiting reported.  No chest pain or sob reported.  No abdominal pain.  Takes 2 senna s q hs to keep bowels moving.  Had injection/block - sciatic nerve.  Still with some discomfort.  When stands - has to wait.  Does not appear to limit his activity.  Has noticed when sitting - burning sensation (at times) - buttock.  Also reports has noticed when he is lying down - cough.  Present for two months.  No chest congestion.  No fever.  No acute infectious symptoms.  No acid reflux reported.    Past Medical History:  Diagnosis Date  . Allergic rhinitis   . BPH (benign prostatic hypertrophy)   . Degenerative arthritis   . Diabetes mellitus ([place])   . GERD (gastroesophageal reflux disease)   . HTN (hypertension)   . Hypercholesterolemia   . HYPERTENSION, BENIGN 08/15/2010   Qualifier: Diagnosis of  By: Rockey Situ MD, Tim    . Iron deficiency anemia   . Skin cancer   . TIA (transient ischemic attack) 09/13/2015  . Urinary outflow obstruction    mild   Past Surgical History:  Procedure Laterality Date  . PARTIAL HIP ARTHROPLASTY  01/2019  . PILONIDAL CYST EXCISION  1951   removal  . [place]  . SKIN CANCER EXCISION     multiple  . SQUAMOUS CELL CARCINOMA EXCISION     behind head  . TONSILLECTOMY AND ADENOIDECTOMY  1938   Family History  Problem Relation Age of Onset  . Heart disease Father        heart atack  . Alzheimer's disease Mother   . CVA Brother    Social History   Socioeconomic History  . Marital status: Married    Spouse name: Not on file  . Number of children: Not on file  .  Years of education: Not on file  . Highest education level: Not on file  Occupational History  . Not on file  Tobacco Use  . Smoking status: Never Smoker  . Smokeless tobacco: Never Used  Substance and Sexual Activity  . Alcohol use: No    Alcohol/week: 0.0 standard drinks  . Drug use: No  . Sexual activity: Not on file  Other Topics Concern  . Not on file  Social History Narrative   Retired, married. Walks everyday         Social Determinants of Health   Financial Resource Strain: Low Risk   . Difficulty of Paying Living Expenses: Not hard at all  Food Insecurity: No Food Insecurity  . Worried About Charity fundraiser in the Last Year: Never true  . Ran Out of Food in the Last Year: Never true  Transportation Needs: No Transportation Needs  . Lack of Transportation (Medical): No  . Lack of Transportation (Non-Medical): No  Physical Activity: Insufficiently Active  . Days of Exercise per Week: 2 days  . Minutes of Exercise per Session: 60 min  Stress: No Stress Concern Present  . Feeling of  Stress : Not at all  Social Connections: Unknown  . Frequency of Communication with Friends and Family: Not asked  . Frequency of Social Gatherings with Friends and Family: Not on file  . Attends Religious Services: Not on file  . Active Member of Clubs or Organizations: Not on file  . Attends Archivist Meetings: Not on file  . Marital Status: Not on file    Outpatient Encounter Medications as of 01/25/2020  Medication Sig  . Accu-Chek Softclix Lancets lancets Use as instructed  . amLODipine (NORVASC) 5 MG tablet Take 1 tablet (5 mg total) by mouth daily.  Marland Kitchen aspirin (ASPIR-LOW) 81 MG EC tablet Take 81 mg by mouth daily.    . calcium-vitamin D (OSCAL 500/200 D-3) 500-200 MG-UNIT per tablet Take 1 tablet by mouth 2 (two) times daily.   Marland Kitchen docusate sodium (COLACE) 100 MG capsule Take by mouth.  . finasteride (PROSCAR) 5 MG tablet Take 1 tablet (5 mg total) by mouth daily.    Marland Kitchen glucose blood (ACCU-CHEK COMPACT PLUS) test strip   . lovastatin (MEVACOR) 20 MG tablet Take 1 tablet (20 mg total) by mouth at bedtime.  . metFORMIN (GLUCOPHAGE-XR) 500 MG 24 hr tablet TAKE 1 TABLET BY MOUTH ONCE A DAY WITH BREAKFAST  . mycophenolate (CELLCEPT) 500 MG tablet Take by mouth.  . olmesartan (BENICAR) 40 MG tablet Take 1 tablet (40 mg total) by mouth daily.  . tamsulosin (FLOMAX) 0.4 MG CAPS capsule TAKE 1 CAPSULE(0.4 MG) BY MOUTH DAILY AFTER SUPPER  . UNABLE TO FIND Use once daily to check blood sugars Dx 250.00   No facility-administered encounter medications on file as of 01/25/2020.    Review of Systems  Constitutional: Negative for appetite change and unexpected weight change.  HENT: Negative for congestion and sinus pressure.   Respiratory: Positive for cough. Negative for chest tightness and shortness of breath.   Cardiovascular: Negative for chest pain, palpitations and leg swelling.  Gastrointestinal: Negative for abdominal pain, diarrhea, nausea and vomiting.  Genitourinary: Negative for difficulty urinating and dysuria.  Musculoskeletal: Negative for joint swelling and myalgias.  Skin: Negative for color change and rash.  Neurological: Negative for dizziness, light-headedness and headaches.  Psychiatric/Behavioral: Negative for agitation and dysphoric mood.       Objective:    Physical Exam Constitutional:      General: He is not in acute distress.    Appearance: Normal appearance. He is well-developed.  HENT:     Head: Normocephalic and atraumatic.  Eyes:     General: No scleral icterus.       Right eye: No discharge.        Left eye: No discharge.     Conjunctiva/sclera: Conjunctivae normal.  Cardiovascular:     Rate and Rhythm: Normal rate and regular rhythm.  Pulmonary:     Effort: Pulmonary effort is normal. No respiratory distress.     Breath sounds: Normal breath sounds.  Abdominal:     General: Bowel sounds are normal.     Palpations:  Abdomen is soft.     Tenderness: There is no abdominal tenderness.  Musculoskeletal:        General: No swelling or tenderness.     Cervical back: Neck supple. No tenderness.  Lymphadenopathy:     Cervical: No cervical adenopathy.  Skin:    Findings: No erythema or rash.  Neurological:     Mental Status: He is alert.  Psychiatric:        Mood and  Affect: Mood normal.        Behavior: Behavior normal.     BP 140/70   Pulse 87   Temp 97.8 F (36.6 C)   Resp 16   Ht '5\' 11"'  (1.803 m)   Wt 223 lb 3.2 oz (101.2 kg)   SpO2 98%   BMI 31.13 kg/m  Wt Readings from Last 3 Encounters:  01/25/20 223 lb 3.2 oz (101.2 kg)  10/22/19 209 lb (94.8 kg)  09/23/19 214 lb (97.1 kg)     Lab Results  Component Value Date   WBC 6.9 12/03/2019   HGB 12.9 (L) 12/03/2019   HCT 39.3 12/03/2019   PLT 306.0 12/03/2019   GLUCOSE 147 (H) 12/03/2019   CHOL 133 12/03/2019   TRIG 109.0 12/03/2019   HDL 51.60 12/03/2019   LDLCALC 59 12/03/2019   ALT 14 12/03/2019   AST 18 12/03/2019   NA 135 12/03/2019   K 4.5 12/03/2019   CL 100 12/03/2019   CREATININE 0.75 12/03/2019   BUN 15 12/03/2019   CO2 27 12/03/2019   TSH 3.52 07/23/2019   PSA 0.02 (L) 04/16/2018   HGBA1C 7.0 (H) 12/03/2019   MICROALBUR 7.6 (H) 07/23/2019    MR LUMBAR SPINE WO CONTRAST  Addendum Date: 05/01/2018   ADDENDUM REPORT: 05/01/2018 17:12 ADDENDUM: Chronic T12, L2, L3, and L5 compression fractures. Electronically Signed   By: Logan Bores M.D.   On: 05/01/2018 17:12   Result Date: 05/01/2018 CLINICAL DATA:  Lumbar radiculopathy. Low back and left leg pain for couple of months. EXAM: MRI LUMBAR SPINE WITHOUT CONTRAST TECHNIQUE: Multiplanar, multisequence MR imaging of the lumbar spine was performed. No intravenous contrast was administered. COMPARISON:  None. FINDINGS: Segmentation:  Standard. Alignment: Mild lumbar levoscoliosis. 3 mm anterolisthesis of L3 on L4. Vertebrae: T12, L2, L3, and L5 compression fractures with  mild-to-moderate vertebral body height loss and no significant marrow edema. 3 mm retropulsion of the posterosuperior T12 vertebral body. Mild periarticular marrow and soft tissue edema associated with left L5-S1 facet arthritis. Conus medullaris and cauda equina: Conus extends to the lower L1 level. Conus and cauda equina appear normal. Paraspinal and other soft tissues: Mild left lower lumbar posterior paraspinal muscle, possibly strain. Disc levels: Disc desiccation throughout the lower thoracic and lumbar spine. Mild disc space narrowing at L2-3 and L4-5. T11-12: Mild T12 superior endplate retropulsion, mild disc bulging, and mild facet arthrosis result in borderline to mild spinal stenosis without neural foraminal stenosis. T12-L1: Mild disc bulging and moderate facet hypertrophy without stenosis. L1-2: Mild circumferential disc bulging and moderate facet hypertrophy result in minimal right neural foraminal narrowing without spinal stenosis. L2-3: Circumferential disc bulging, a broad right subarticular to foraminal disc protrusion, and moderate facet hypertrophy result in minimal right lateral recess narrowing without spinal or neural foraminal stenosis. L3-4: Anterolisthesis with slight bulging of uncovered disc, mildly prominent dorsal epidural fat, and severe facet hypertrophy result in mild-to-moderate spinal stenosis, mild bilateral lateral recess stenosis, and mild bilateral neural foraminal stenosis. There is likely facet ankylosis bilaterally. L4-5: Disc bulging, mild ligamentum flavum hypertrophy, and moderate left greater than right facet hypertrophy result in mild spinal stenosis, mild-to-moderate left greater than right lateral recess stenosis, and minimal bilateral neural foraminal stenosis. L5-S1: Disc bulging and severe left greater than right facet arthrosis result in mild left lateral recess stenosis and mild right and severe left neural foraminal stenosis with potential left L5 nerve root  impingement. There is a left facet joint effusion, and a 6  x 3 mm synovial cyst is present anterior to the left facet joint in close proximity to the exiting left L5 nerve at the lateral aspect of the foramen though without frank nerve compression. IMPRESSION: 1. Severe left L5-S1 facet arthritis with associated joint effusion and edema. Severe left neural foraminal stenosis with potential left L5 nerve root impingement. 2. Mild-to-moderate spinal and lateral recess stenosis at L3-4 and L4-5. Electronically Signed: By: Logan Bores M.D. On: 05/01/2018 13:20       Assessment & Plan:   Problem List Items Addressed This Visit    Anemia    Follow cbc.       Relevant Orders   CBC with Differential/Platelet   B12 deficiency    Check B12 level.       Relevant Orders   Vitamin B12   Burning sensation of skin    Noticed in sacral area.  No redness. No skin breakdown.  Only notices when sitting.  Adjust sitting to alleviate pressure.  Follow.        Cough    Persistent cough.  No congestion or drainage.  Notices when lying down.  Check cxr.  Consider trial of pepcid.        Relevant Orders   DG Chest 2 View (Completed)   Diabetes mellitus ([place])    Low carb diet and exercise.  Follow met b and a1c.   Lab Results  Component Value Date   HGBA1C 7.0 (H) 12/03/2019        Relevant Orders   Hemoglobin U8E   Basic metabolic panel   Hip fracture, right, sequela    S/p ORIF - right hip fracture.  Doing well.  Using walker.  Follow.        Hypercholesterolemia    On lovastatin.  Low cholesterol diet and exercise.  Follow lipid panel and liver function tests.        Relevant Orders   Hepatic function panel   Lipid panel   HYPERTENSION, BENIGN    Blood pressure under good control.  Continue same medication regimen - benicar and amlodipine.   Follow pressures.  Follow metabolic panel.        Myasthenia gravis ([place])    On cellcept.  Seeing neurology.  Stable.             Einar Pheasant, MD

## 2020-01-31 ENCOUNTER — Encounter: Payer: Self-pay | Admitting: Internal Medicine

## 2020-01-31 DIAGNOSIS — R208 Other disturbances of skin sensation: Secondary | ICD-10-CM | POA: Insufficient documentation

## 2020-01-31 NOTE — Assessment & Plan Note (Signed)
Blood pressure under good control.  Continue same medication regimen - benicar and amlodipine.   Follow pressures.  Follow metabolic panel.

## 2020-01-31 NOTE — Assessment & Plan Note (Signed)
Persistent cough.  No congestion or drainage.  Notices when lying down.  Check cxr.  Consider trial of pepcid.

## 2020-01-31 NOTE — Assessment & Plan Note (Signed)
Low carb diet and exercise.  Follow met b and a1c.   Lab Results  Component Value Date   HGBA1C 7.0 (H) 12/03/2019

## 2020-01-31 NOTE — Assessment & Plan Note (Signed)
Noticed in sacral area.  No redness. No skin breakdown.  Only notices when sitting.  Adjust sitting to alleviate pressure.  Follow.

## 2020-01-31 NOTE — Assessment & Plan Note (Signed)
Check B12 level. 

## 2020-01-31 NOTE — Assessment & Plan Note (Signed)
On cellcept.  Seeing neurology.  Stable.

## 2020-01-31 NOTE — Assessment & Plan Note (Signed)
S/p ORIF - right hip fracture.  Doing well.  Using walker.  Follow.

## 2020-01-31 NOTE — Assessment & Plan Note (Signed)
Follow cbc.  

## 2020-01-31 NOTE — Assessment & Plan Note (Signed)
On lovastatin.  Low cholesterol diet and exercise.  Follow lipid panel and liver function tests.   

## 2020-02-09 ENCOUNTER — Ambulatory Visit (INDEPENDENT_AMBULATORY_CARE_PROVIDER_SITE_OTHER): Payer: Medicare Other

## 2020-02-09 ENCOUNTER — Other Ambulatory Visit: Payer: Self-pay

## 2020-02-09 DIAGNOSIS — E538 Deficiency of other specified B group vitamins: Secondary | ICD-10-CM | POA: Diagnosis not present

## 2020-02-09 MED ORDER — CYANOCOBALAMIN 1000 MCG/ML IJ SOLN
1000.0000 ug | Freq: Once | INTRAMUSCULAR | Status: AC
  Administered 2020-02-09: 1000 ug via INTRAMUSCULAR

## 2020-02-09 NOTE — Progress Notes (Addendum)
Patient presented for B 12 injection to left deltoid, patient voiced no concerns nor showed any signs of distress during injection.  Reviewed.  Dr Scott 

## 2020-02-22 ENCOUNTER — Other Ambulatory Visit: Payer: Self-pay | Admitting: Urology

## 2020-02-22 DIAGNOSIS — N401 Enlarged prostate with lower urinary tract symptoms: Secondary | ICD-10-CM

## 2020-02-25 ENCOUNTER — Other Ambulatory Visit: Payer: Self-pay

## 2020-02-25 ENCOUNTER — Telehealth: Payer: Self-pay | Admitting: Internal Medicine

## 2020-02-25 MED ORDER — AMLODIPINE BESYLATE 5 MG PO TABS: 5.0000 mg | ORAL_TABLET | Freq: Every day | ORAL | 3 refills | Status: DC

## 2020-02-25 NOTE — Telephone Encounter (Signed)
Pt needs a refill on amLODipine (NORVASC) 5 MG tablet sent [facility]

## 2020-02-25 NOTE — Telephone Encounter (Signed)
Rx refilled.

## 2020-02-29 ENCOUNTER — Encounter: Payer: Self-pay | Admitting: Internal Medicine

## 2020-03-01 NOTE — Telephone Encounter (Signed)
Patient is not having any other symptoms and would like to hold on pulmonary referral for now.

## 2020-03-01 NOTE — Telephone Encounter (Signed)
Please call pt and see if he is having sob or any other symptoms associated with the cough.  If persistent, would like to refer to pulmonary

## 2020-03-10 ENCOUNTER — Other Ambulatory Visit: Payer: Self-pay

## 2020-03-10 ENCOUNTER — Ambulatory Visit (INDEPENDENT_AMBULATORY_CARE_PROVIDER_SITE_OTHER): Payer: Medicare Other | Admitting: Podiatry

## 2020-03-10 ENCOUNTER — Encounter: Payer: Self-pay | Admitting: Podiatry

## 2020-03-10 VITALS — Temp 97.6°F

## 2020-03-10 DIAGNOSIS — M79675 Pain in left toe(s): Secondary | ICD-10-CM

## 2020-03-10 DIAGNOSIS — B351 Tinea unguium: Secondary | ICD-10-CM

## 2020-03-10 DIAGNOSIS — E1142 Type 2 diabetes mellitus with diabetic polyneuropathy: Secondary | ICD-10-CM

## 2020-03-10 DIAGNOSIS — Q828 Other specified congenital malformations of skin: Secondary | ICD-10-CM | POA: Diagnosis not present

## 2020-03-10 DIAGNOSIS — M79674 Pain in right toe(s): Secondary | ICD-10-CM | POA: Diagnosis not present

## 2020-03-10 NOTE — Progress Notes (Signed)
This patient returns to my office for at risk foot care.  This patient requires this care by a professional since this patient will be at risk due to having type 2 diabetes.    This patient is unable to cut nails himself since the patient cannot reach his nails.These nails are painful walking and wearing shoes.  He has developed a painful callus on the outside of his left forefoot which is painful walking at home. This patient presents for at risk foot care today.  General Appearance  Alert, conversant and in no acute stress.  Vascular  Dorsalis pedis and posterior tibial  pulses are palpable  bilaterally.  Capillary return is within normal limits  bilaterally. Temperature is within normal limits  bilaterally.  Neurologic  Senn-Weinstein monofilament wire test within normal limits  bilaterally. Muscle power within normal limits bilaterally.  Nails Thick disfigured discolored nails with subungual debris  from hallux to fifth toes bilaterally. No evidence of bacterial infection or drainage bilaterally.  Orthopedic  No limitations of motion  feet .  No crepitus or effusions noted.  No bony pathology or digital deformities noted.  Skin  normotropic skin with no porokeratosis noted bilaterally.  No signs of infections or ulcers noted.   Porokeratosis sub 5th met left foot.  Onychomycosis  Pain in right toes  Pain in left toes  Consent was obtained for treatment procedures.   Mechanical debridement of nails 1-5  bilaterally performed with a nail nipper.  Filed with dremel without incident.  Debridement of porokeratosis with a # 15 blade.   Patient also states he has developed a reddish purplish discolored area on top of right big toe.  Swelling is present but no pain noted.  No inflammation noted.  Told him to watch the skin lesion and if worsens call the office.     Return office visit   3 months                  Told patient to return for periodic foot care and evaluation due to potential at risk  complications.   Gardiner Barefoot DPM

## 2020-03-15 ENCOUNTER — Ambulatory Visit (INDEPENDENT_AMBULATORY_CARE_PROVIDER_SITE_OTHER): Payer: Medicare Other

## 2020-03-15 ENCOUNTER — Other Ambulatory Visit: Payer: Self-pay

## 2020-03-15 DIAGNOSIS — E538 Deficiency of other specified B group vitamins: Secondary | ICD-10-CM | POA: Diagnosis not present

## 2020-03-15 MED ORDER — CYANOCOBALAMIN 1000 MCG/ML IJ SOLN
1000.0000 ug | Freq: Once | INTRAMUSCULAR | Status: AC
  Administered 2020-03-15: 1000 ug via INTRAMUSCULAR

## 2020-03-15 NOTE — Progress Notes (Addendum)
Patient presented for B 12 injection to left deltoid, patient voiced no concerns nor showed any signs of distress during injection.  Reviewed.  Dr Scott 

## 2020-03-24 ENCOUNTER — Encounter: Payer: Self-pay | Admitting: Internal Medicine

## 2020-03-24 DIAGNOSIS — E119 Type 2 diabetes mellitus without complications: Secondary | ICD-10-CM

## 2020-03-24 DIAGNOSIS — I10 Essential (primary) hypertension: Secondary | ICD-10-CM

## 2020-03-28 MED ORDER — LOVASTATIN 20 MG PO TABS: 20.0000 mg | ORAL_TABLET | Freq: Every day | ORAL | 1 refills | Status: DC

## 2020-03-28 MED ORDER — OLMESARTAN MEDOXOMIL 40 MG PO TABS: 40.0000 mg | ORAL_TABLET | Freq: Every day | ORAL | 2 refills | Status: DC

## 2020-03-28 MED ORDER — METFORMIN HCL ER 500 MG PO TB24: ORAL_TABLET | ORAL | 0 refills | Status: DC

## 2020-03-28 NOTE — Telephone Encounter (Signed)
Called [facility] to cancel and these medication had already been transferred out of their system.   Medication are now going to [facility]

## 2020-04-12 ENCOUNTER — Other Ambulatory Visit: Payer: Self-pay | Admitting: Internal Medicine

## 2020-04-12 DIAGNOSIS — E119 Type 2 diabetes mellitus without complications: Secondary | ICD-10-CM

## 2020-04-21 ENCOUNTER — Ambulatory Visit (INDEPENDENT_AMBULATORY_CARE_PROVIDER_SITE_OTHER): Payer: Medicare Other

## 2020-04-21 ENCOUNTER — Other Ambulatory Visit: Payer: Self-pay

## 2020-04-21 ENCOUNTER — Other Ambulatory Visit (INDEPENDENT_AMBULATORY_CARE_PROVIDER_SITE_OTHER): Payer: Medicare Other

## 2020-04-21 DIAGNOSIS — E538 Deficiency of other specified B group vitamins: Secondary | ICD-10-CM | POA: Diagnosis not present

## 2020-04-21 DIAGNOSIS — D649 Anemia, unspecified: Secondary | ICD-10-CM | POA: Diagnosis not present

## 2020-04-21 DIAGNOSIS — E7800 Pure hypercholesterolemia, unspecified: Secondary | ICD-10-CM | POA: Diagnosis not present

## 2020-04-21 DIAGNOSIS — E1165 Type 2 diabetes mellitus with hyperglycemia: Secondary | ICD-10-CM | POA: Diagnosis not present

## 2020-04-21 LAB — HEPATIC FUNCTION PANEL
ALT: 17 U/L (ref 0–53)
AST: 17 U/L (ref 0–37)
Albumin: 4.1 g/dL (ref 3.5–5.2)
Alkaline Phosphatase: 42 U/L (ref 39–117)
Bilirubin, Direct: 0.2 mg/dL (ref 0.0–0.3)
Total Bilirubin: 0.9 mg/dL (ref 0.2–1.2)
Total Protein: 7.2 g/dL (ref 6.0–8.3)

## 2020-04-21 LAB — HEMOGLOBIN A1C: Hgb A1c MFr Bld: 7.4 % — ABNORMAL HIGH (ref 4.6–6.5)

## 2020-04-21 LAB — CBC WITH DIFFERENTIAL/PLATELET
Basophils Absolute: 0.1 10*3/uL (ref 0.0–0.1)
Basophils Relative: 0.9 % (ref 0.0–3.0)
Eosinophils Absolute: 0.1 10*3/uL (ref 0.0–0.7)
Eosinophils Relative: 1.7 % (ref 0.0–5.0)
HCT: 37.9 % — ABNORMAL LOW (ref 39.0–52.0)
Hemoglobin: 12.6 g/dL — ABNORMAL LOW (ref 13.0–17.0)
Lymphocytes Relative: 20 % (ref 12.0–46.0)
Lymphs Abs: 1.4 10*3/uL (ref 0.7–4.0)
MCHC: 33.2 g/dL (ref 30.0–36.0)
MCV: 92.3 fl (ref 78.0–100.0)
Monocytes Absolute: 0.6 10*3/uL (ref 0.1–1.0)
Monocytes Relative: 8.1 % (ref 3.0–12.0)
Neutro Abs: 4.9 10*3/uL (ref 1.4–7.7)
Neutrophils Relative %: 69.3 % (ref 43.0–77.0)
Platelets: 322 10*3/uL (ref 150.0–400.0)
RBC: 4.1 Mil/uL — ABNORMAL LOW (ref 4.22–5.81)
RDW: 13.1 % (ref 11.5–15.5)
WBC: 7.1 10*3/uL (ref 4.0–10.5)

## 2020-04-21 LAB — BASIC METABOLIC PANEL
BUN: 15 mg/dL (ref 6–23)
CO2: 29 mEq/L (ref 19–32)
Calcium: 9.8 mg/dL (ref 8.4–10.5)
Chloride: 99 mEq/L (ref 96–112)
Creatinine, Ser: 0.77 mg/dL (ref 0.40–1.50)
GFR: 94.58 mL/min (ref >60.00)
Glucose, Bld: 154 mg/dL — ABNORMAL HIGH (ref 70–99)
Potassium: 5.1 mEq/L (ref 3.5–5.1)
Sodium: 135 mEq/L (ref 135–145)

## 2020-04-21 LAB — LIPID PANEL
Cholesterol: 129 mg/dL (ref 0–200)
HDL: 50.4 mg/dL (ref >39.00)
LDL Cholesterol: 55 mg/dL (ref 0–99)
NonHDL: 78.79
Total CHOL/HDL Ratio: 3
Triglycerides: 120 mg/dL (ref 0.0–149.0)
VLDL: 24 mg/dL (ref 0.0–40.0)

## 2020-04-21 LAB — VITAMIN B12: Vitamin B-12: 548 pg/mL (ref 211–911)

## 2020-04-21 MED ORDER — CYANOCOBALAMIN 1000 MCG/ML IJ SOLN
1000.0000 ug | Freq: Once | INTRAMUSCULAR | Status: AC
  Administered 2020-04-21: 1000 ug via INTRAMUSCULAR

## 2020-04-21 NOTE — Progress Notes (Signed)
Patient presented for B 12 injection to left deltoid, patient voiced no concerns nor showed any signs of distress during injection. 

## 2020-04-26 ENCOUNTER — Encounter: Payer: Self-pay | Admitting: Internal Medicine

## 2020-04-26 ENCOUNTER — Telehealth: Payer: Self-pay | Admitting: Internal Medicine

## 2020-04-26 ENCOUNTER — Telehealth (INDEPENDENT_AMBULATORY_CARE_PROVIDER_SITE_OTHER): Payer: Medicare Other | Admitting: Internal Medicine

## 2020-04-26 DIAGNOSIS — M545 Low back pain, unspecified: Secondary | ICD-10-CM

## 2020-04-26 DIAGNOSIS — I10 Essential (primary) hypertension: Secondary | ICD-10-CM | POA: Diagnosis not present

## 2020-04-26 DIAGNOSIS — D649 Anemia, unspecified: Secondary | ICD-10-CM

## 2020-04-26 DIAGNOSIS — E875 Hyperkalemia: Secondary | ICD-10-CM | POA: Diagnosis not present

## 2020-04-26 DIAGNOSIS — G7000 Myasthenia gravis without (acute) exacerbation: Secondary | ICD-10-CM

## 2020-04-26 DIAGNOSIS — E7800 Pure hypercholesterolemia, unspecified: Secondary | ICD-10-CM | POA: Diagnosis not present

## 2020-04-26 DIAGNOSIS — E1165 Type 2 diabetes mellitus with hyperglycemia: Secondary | ICD-10-CM

## 2020-04-26 NOTE — Progress Notes (Signed)
Patient ID: [name], male   DOB: 08/10/1926, 86 y.o.   MRN: [id number]   Virtual Visit via telephhone Note  This visit type was conducted due to national recommendations for restrictions regarding the COVID-19 pandemic (e.g. social distancing).  This format is felt to be most appropriate for this patient at this time.  All issues noted in this document were discussed and addressed.  No physical exam was performed (except for noted visual exam findings with Video Visits).   I connected with [name] by telephone and verified that I am speaking with the correct person using two identifiers. Location patient: home Location provider: work Persons participating in the virtual visit: patient, provider  The limitations, risks, security and privacy concerns of performing an evaluation and management service by telephone and the availability of in person appointments have been discussed.   It has also been discussed with the patient that there may be a patient responsible charge related to this service. The patient has expressed understanding and has agreed to proceed.   Reason for visit: follow up appt  HPI: F/u regarding his diabetes, hypertension and hypercholesterolemia.  States he is doing relatively well.  Walking with his cane.  Getting around his house ok. Able to do his ADLs. Uses a walker at times if needed.  No recent falls.  No chest pain or change in breathing.  Discussed labs.  Blood sugar - low 140s.  No acid relfux.  No abdominal pain.  bowle smoving.  With increased lower back and hip pain.  Taking tylenol arthritis.  Helps some.  Sees Dr [name].  Back worse in am.  Some swelling in his feet at times.  Discussed leg elevation and compression hose.  Worse as day progresses.     ROS: See pertinent positives and negatives per HPI.  Past Medical History:  Diagnosis Date  . Allergic rhinitis   . BPH (benign prostatic hypertrophy)   . Degenerative arthritis   .  Diabetes mellitus ([place])   . GERD (gastroesophageal reflux disease)   . HTN (hypertension)   . Hypercholesterolemia   . HYPERTENSION, BENIGN 08/15/2010   Qualifier: Diagnosis of  By: Justin Situ MD, Tim    . Iron deficiency anemia   . Skin cancer   . TIA (transient ischemic attack) 09/13/2015  . Urinary outflow obstruction    mild    Past Surgical History:  Procedure Laterality Date  . PARTIAL HIP ARTHROPLASTY  01/2019  . PILONIDAL CYST EXCISION  1951   removal  . [place]  . SKIN CANCER EXCISION     multiple  . SQUAMOUS CELL CARCINOMA EXCISION     behind head  . TONSILLECTOMY AND ADENOIDECTOMY  1938    Family History  Problem Relation Age of Onset  . Heart disease Father        heart atack  . Alzheimer's disease Mother   . CVA Brother     SOCIAL HX: reviewed.    Current Outpatient Medications:  .  Accu-Chek Softclix Lancets lancets, Use as instructed, Disp: , Rfl:  .  amLODipine (NORVASC) 5 MG tablet, TAKE 1 TABLET(5 MG) BY MOUTH DAILY, Disp: 30 tablet, Rfl: 3 .  aspirin (ASPIR-LOW) 81 MG EC tablet, Take 81 mg by mouth daily.  , Disp: , Rfl:  .  calcium-vitamin D (OSCAL 500/200 D-3) 500-200 MG-UNIT per tablet, Take 1 tablet by mouth 2 (two) times daily. , Disp: , Rfl:  .  docusate sodium (COLACE) 100 MG  capsule, Take by mouth., Disp: , Rfl:  .  finasteride (PROSCAR) 5 MG tablet, TAKE 1 TABLET(5 MG) BY MOUTH DAILY, Disp: 90 tablet, Rfl: 3 .  glucose blood (ACCU-CHEK COMPACT PLUS) test strip, , Disp: , Rfl:  .  lovastatin (MEVACOR) 20 MG tablet, Take 1 tablet (20 mg total) by mouth at bedtime., Disp: 90 tablet, Rfl: 1 .  metFORMIN (GLUCOPHAGE-XR) 500 MG 24 hr tablet, TAKE 1 TABLET BY MOUTH EVERY DAY WITH BREAKFAST, Disp: 90 tablet, Rfl: 0 .  mycophenolate (CELLCEPT) 500 MG tablet, Take by mouth., Disp: , Rfl:  .  olmesartan (BENICAR) 40 MG tablet, Take 1 tablet (40 mg total) by mouth daily., Disp: 30 tablet, Rfl: 2 .  tamsulosin (FLOMAX) 0.4 MG CAPS capsule,  TAKE 1 CAPSULE(0.4 MG) BY MOUTH DAILY AFTER SUPPER, Disp: 90 capsule, Rfl: 2 .  UNABLE TO FIND, Use once daily to check blood sugars Dx 250.00, Disp: , Rfl:   EXAM:  GENERAL: alert. Sounds to be in no acute distress.  Answering questions appropriately.    PSYCH/NEURO: pleasant and cooperative, no obvious depression or anxiety, speech and thought processing grossly intact  ASSESSMENT AND PLAN:  Discussed the following assessment and plan:  Myasthenia gravis ([place]) Followed by neurology.  On cellcept.  Stable.    HYPERTENSION, BENIGN Blood pressure has been controlled.  Continue benicar and amlodipine.  Follow pressures.  Follow metabolic panel.   Hypercholesterolemia On lovastatin.  Low cholesterol diet and exercise.  Follow lipid panel and liver function tests.    Diabetes mellitus Low carb diet and exercise.  Continue metformin.  Follow met b and a1c.   Lab Results  Component Value Date   HGBA1C 7.4 (H) 04/21/2020    Anemia Follow cbc.    Low back pain Increased pain as outlined.  Tylenol helps.  Has seen Dr [name].  Request f/u.     Orders Placed This Encounter  Procedures  . Potassium    Standing Status:   Future    Standing Expiration Date:   05/01/2021     I discussed the assessment and treatment plan with the patient. The patient was provided an opportunity to ask questions and all were answered. The patient agreed with the plan and demonstrated an understanding of the instructions.   The patient was advised to call back or seek an in-person evaluation if the symptoms worsen or if the condition fails to improve as anticipated.  I provided 23 minutes of non-face-to-face time during this encounter.   Einar Pheasant, MD

## 2020-04-26 NOTE — Telephone Encounter (Signed)
Called said he cut you off by mistake

## 2020-05-01 ENCOUNTER — Encounter: Payer: Self-pay | Admitting: Internal Medicine

## 2020-05-01 NOTE — Assessment & Plan Note (Signed)
Low carb diet and exercise.  Continue metformin.  Follow met b and a1c.   Lab Results  Component Value Date   HGBA1C 7.4 (H) 04/21/2020

## 2020-05-01 NOTE — Assessment & Plan Note (Signed)
Increased pain as outlined.  Tylenol helps.  Has seen Dr Sharlet Salina.  Request f/u.

## 2020-05-01 NOTE — Assessment & Plan Note (Signed)
On lovastatin.  Low cholesterol diet and exercise.  Follow lipid panel and liver function tests.   

## 2020-05-01 NOTE — Assessment & Plan Note (Signed)
Follow cbc.  

## 2020-05-01 NOTE — Assessment & Plan Note (Signed)
Blood pressure has been controlled.  Continue benicar and amlodipine.  Follow pressures.  Follow metabolic panel.

## 2020-05-01 NOTE — Assessment & Plan Note (Signed)
Followed by neurology.  On cellcept.  Stable.

## 2020-05-11 ENCOUNTER — Telehealth: Payer: Self-pay

## 2020-05-11 DIAGNOSIS — E119 Type 2 diabetes mellitus without complications: Secondary | ICD-10-CM

## 2020-05-11 DIAGNOSIS — E1165 Type 2 diabetes mellitus with hyperglycemia: Secondary | ICD-10-CM

## 2020-05-11 DIAGNOSIS — E7800 Pure hypercholesterolemia, unspecified: Secondary | ICD-10-CM

## 2020-05-11 DIAGNOSIS — I10 Essential (primary) hypertension: Secondary | ICD-10-CM

## 2020-05-11 NOTE — Telephone Encounter (Signed)
Patient was supposed to have his potassium rechecked on 6/22 but forgot his appt and we had to do virtual. He has a b12 injection scheduled for 7/21 and was wondering if he could just have it done then so he doesn't have to come twice. Advised he is overdue for his potassium check but I could try to move his b12 injection up 1 week if you are agreeable for him to wait until then for labs. His last b12 was 6/17 so we could have him come in the week of 7/13 and do both.

## 2020-05-11 NOTE — Telephone Encounter (Signed)
Since I am not here next week, can he come this week to have potassium check and B12 without insurance denial?

## 2020-05-12 NOTE — Telephone Encounter (Signed)
Pt coming in tomorrow for potassium recheck and will keep b12 on 7/21. Patient was advised to schedule fasting labs and 19m f/u (end of September) while he is in office tomorrow.

## 2020-05-13 ENCOUNTER — Other Ambulatory Visit: Payer: Self-pay

## 2020-05-13 ENCOUNTER — Other Ambulatory Visit (INDEPENDENT_AMBULATORY_CARE_PROVIDER_SITE_OTHER): Payer: Medicare Other

## 2020-05-13 DIAGNOSIS — E875 Hyperkalemia: Secondary | ICD-10-CM | POA: Diagnosis not present

## 2020-05-13 LAB — POTASSIUM: Potassium: 5.1 mEq/L (ref 3.5–5.1)

## 2020-05-13 NOTE — Telephone Encounter (Signed)
70m follow has been scheduled. Could not schedule fasting labs, no orders for labs.

## 2020-05-13 NOTE — Telephone Encounter (Signed)
Labs ordered future. Noted on nurse schedule to schedule fasting labs prior to sept appt.

## 2020-05-14 ENCOUNTER — Other Ambulatory Visit: Payer: Self-pay | Admitting: Internal Medicine

## 2020-05-14 DIAGNOSIS — E1165 Type 2 diabetes mellitus with hyperglycemia: Secondary | ICD-10-CM

## 2020-05-14 NOTE — Progress Notes (Signed)
Order placed for f/u lab.   

## 2020-05-16 ENCOUNTER — Telehealth: Payer: Self-pay

## 2020-05-16 NOTE — Telephone Encounter (Signed)
Left detailed message for patient regarding potassium lab. Needs non fasting lab appt in 4 weeks. Lab has been ordered. Does not need fasting labs right now. Those are ordered future for sept.

## 2020-05-25 ENCOUNTER — Ambulatory Visit (INDEPENDENT_AMBULATORY_CARE_PROVIDER_SITE_OTHER): Payer: Medicare Other

## 2020-05-25 ENCOUNTER — Other Ambulatory Visit: Payer: Self-pay

## 2020-05-25 DIAGNOSIS — E538 Deficiency of other specified B group vitamins: Secondary | ICD-10-CM

## 2020-05-25 DIAGNOSIS — E875 Hyperkalemia: Secondary | ICD-10-CM

## 2020-05-25 MED ORDER — CYANOCOBALAMIN 1000 MCG/ML IJ SOLN
1000.0000 ug | Freq: Once | INTRAMUSCULAR | Status: AC
  Administered 2020-05-25: 1000 ug via INTRAMUSCULAR

## 2020-05-25 NOTE — Progress Notes (Addendum)
Patient presented for B 12 injection to left deltoid, patient voiced no concerns nor showed any signs of distress during injection.  Reviewed.  Dr Scott 

## 2020-06-02 ENCOUNTER — Ambulatory Visit: Payer: Medicare Other

## 2020-06-13 ENCOUNTER — Ambulatory Visit (INDEPENDENT_AMBULATORY_CARE_PROVIDER_SITE_OTHER): Payer: Medicare Other | Admitting: Podiatry

## 2020-06-13 ENCOUNTER — Encounter: Payer: Self-pay | Admitting: Podiatry

## 2020-06-13 ENCOUNTER — Other Ambulatory Visit: Payer: Self-pay

## 2020-06-13 DIAGNOSIS — E1142 Type 2 diabetes mellitus with diabetic polyneuropathy: Secondary | ICD-10-CM | POA: Diagnosis not present

## 2020-06-13 DIAGNOSIS — M79675 Pain in left toe(s): Secondary | ICD-10-CM | POA: Diagnosis not present

## 2020-06-13 DIAGNOSIS — B351 Tinea unguium: Secondary | ICD-10-CM | POA: Diagnosis not present

## 2020-06-13 DIAGNOSIS — M79674 Pain in right toe(s): Secondary | ICD-10-CM

## 2020-06-13 DIAGNOSIS — Q828 Other specified congenital malformations of skin: Secondary | ICD-10-CM

## 2020-06-13 NOTE — Progress Notes (Signed)
This patient returns to my office for at risk foot care.  This patient requires this care by a professional since this patient will be at risk due to having type 2 diabetes.  This patient is unable to cut nails himself since the patient cannot reach his nails.These nails are painful walking and wearing shoes.  This patient presents for at risk foot care today.  General Appearance  Alert, conversant and in no acute stress.  Vascular  Dorsalis pedis and posterior tibial  pulses are palpable  bilaterally.  Capillary return is within normal limits  bilaterally. Temperature is within normal limits  bilaterally.  Neurologic  Senn-Weinstein monofilament wire test within normal limits  bilaterally. Muscle power within normal limits bilaterally.  Nails Thick disfigured discolored nails with subungual debris  from hallux to fifth toes bilaterally. No evidence of bacterial infection or drainage bilaterally.  Orthopedic  No limitations of motion  feet .  No crepitus or effusions noted.  No bony pathology or digital deformities noted.  Skin  normotropic skin with no porokeratosis noted bilaterally.  No signs of infections or ulcers noted.     Onychomycosis  Pain in right toes  Pain in left toes  Consent was obtained for treatment procedures.   Mechanical debridement of nails 1-5  bilaterally performed with a nail nipper.  Filed with dremel without incident.    Return office visit    3 months                  Told patient to return for periodic foot care and evaluation due to potential at risk complications.   Ezell Melikian DPM   

## 2020-06-20 ENCOUNTER — Encounter: Payer: Self-pay | Admitting: Urology

## 2020-06-29 ENCOUNTER — Ambulatory Visit (INDEPENDENT_AMBULATORY_CARE_PROVIDER_SITE_OTHER): Payer: Medicare Other

## 2020-06-29 ENCOUNTER — Ambulatory Visit (INDEPENDENT_AMBULATORY_CARE_PROVIDER_SITE_OTHER): Payer: Medicare Other | Admitting: Dermatology

## 2020-06-29 ENCOUNTER — Encounter: Payer: Self-pay | Admitting: Dermatology

## 2020-06-29 ENCOUNTER — Other Ambulatory Visit: Payer: Self-pay

## 2020-06-29 DIAGNOSIS — L570 Actinic keratosis: Secondary | ICD-10-CM | POA: Diagnosis not present

## 2020-06-29 DIAGNOSIS — E538 Deficiency of other specified B group vitamins: Secondary | ICD-10-CM | POA: Diagnosis not present

## 2020-06-29 DIAGNOSIS — L820 Inflamed seborrheic keratosis: Secondary | ICD-10-CM

## 2020-06-29 DIAGNOSIS — L578 Other skin changes due to chronic exposure to nonionizing radiation: Secondary | ICD-10-CM

## 2020-06-29 DIAGNOSIS — D485 Neoplasm of uncertain behavior of skin: Secondary | ICD-10-CM

## 2020-06-29 DIAGNOSIS — D492 Neoplasm of unspecified behavior of bone, soft tissue, and skin: Secondary | ICD-10-CM

## 2020-06-29 DIAGNOSIS — C4441 Basal cell carcinoma of skin of scalp and neck: Secondary | ICD-10-CM | POA: Diagnosis not present

## 2020-06-29 DIAGNOSIS — Z85828 Personal history of other malignant neoplasm of skin: Secondary | ICD-10-CM

## 2020-06-29 HISTORY — DX: Personal history of other malignant neoplasm of skin: Z85.828

## 2020-06-29 MED ORDER — CYANOCOBALAMIN 1000 MCG/ML IJ SOLN
1000.0000 ug | Freq: Once | INTRAMUSCULAR | Status: AC
  Administered 2020-06-29: 1000 ug via INTRAMUSCULAR

## 2020-06-29 NOTE — Patient Instructions (Signed)
Wound Care Instructions  1. Cleanse wound gently with soap and water once a day then pat dry with clean gauze. Apply a thing coat of Petrolatum (petroleum jelly, "Vaseline") over the wound (unless you have an allergy to this). We recommend that you use a new, sterile tube of Vaseline. Do not pick or remove scabs. Do not remove the yellow or white "healing tissue" from the base of the wound.  2. Cover the wound with fresh, clean, nonstick gauze and secure with paper tape. You may use Band-Aids in place of gauze and tape if the would is small enough, but would recommend trimming much of the tape off as there is often too much. Sometimes Band-Aids can irritate the skin.  3. You should call the office for your biopsy report after 1 week if you have not already been contacted.  4. If you experience any problems, such as abnormal amounts of bleeding, swelling, significant bruising, significant pain, or evidence of infection, please call the office immediately.  5. FOR ADULT SURGERY PATIENTS: If you need something for pain relief you may take 1 extra strength Tylenol (acetaminophen) AND 2 Ibuprofen (200mg each) together every 4 hours as needed for pain. (do not take these if you are allergic to them or if you have a reason you should not take them.) Typically, you may only need pain medication for 1 to 3 days.   Cryotherapy Aftercare  . Wash gently with soap and water everyday.   . Apply Vaseline and Band-Aid daily until healed.  Prior to procedure, discussed risks of blister formation, small wound, skin dyspigmentation, or rare scar following cryotherapy.   

## 2020-06-29 NOTE — Progress Notes (Addendum)
Patient presented for B 12 injection to left deltoid, patient voiced no concerns nor showed any signs of distress during injection.  Reviewed.  Dr Scott 

## 2020-06-29 NOTE — Progress Notes (Signed)
Follow-Up Visit   Subjective  [name] is a 86 y.o. male who presents for the following: Skin Problem (Scalp. Feels rough. No bleeding. Non tender. ).  The following portions of the chart were reviewed this encounter and updated as appropriate: Tobacco  Allergies  Meds  Problems  Med Hx  Surg Hx  Fam Hx     Review of Systems: No other skin or systemic complaints except as noted in HPI or Assessment and Plan.  Objective  Well appearing patient in no apparent distress; mood and affect are within normal limits.  A focused examination was performed including scalp, face, neck, arms, hands. Relevant physical exam findings are noted in the Assessment and Plan.  Objective  left crown scalp: 0.8cm crusted papule     Objective  Left brow x1: Erythematous keratotic or waxy stuck-on papule or plaque.   Objective  Left Forehead x1: Erythematous thin papules/macules with gritty scale.   Assessment & Plan  Neoplasm of skin left crown scalp  Epidermal / dermal shaving  Lesion diameter (cm):  0.8 Informed consent: discussed and consent obtained   Timeout: patient name, date of birth, surgical site, and procedure verified   Procedure prep:  Patient was prepped and draped in usual sterile fashion Prep type:  Isopropyl alcohol Anesthesia: the lesion was anesthetized in a standard fashion   Anesthetic:  1% lidocaine w/ epinephrine 1-100,000 buffered w/ 8.4% NaHCO3 Instrument used: flexible razor blade   Hemostasis achieved with: pressure, aluminum chloride and electrodesiccation   Outcome: patient tolerated procedure well   Post-procedure details: sterile dressing applied and wound care instructions given   Dressing type: bandage and petrolatum    Destruction of lesion Complexity: extensive   Destruction method: electrodesiccation and curettage   Informed consent: discussed and consent obtained   Timeout:  patient name, date of birth, surgical site, and procedure  verified Procedure prep:  Patient was prepped and draped in usual sterile fashion Prep type:  Isopropyl alcohol Anesthesia: the lesion was anesthetized in a standard fashion   Anesthetic:  1% lidocaine w/ epinephrine 1-100,000 buffered w/ 8.4% NaHCO3 Curettage performed in three different directions: Yes   Electrodesiccation performed over the curetted area: Yes   Lesion length (cm):  0.8 Lesion width (cm):  0.8 Margin per side (cm):  0.2 Final wound size (cm):  1.2 Hemostasis achieved with:  pressure and aluminum chloride Outcome: patient tolerated procedure well with no complications   Post-procedure details: sterile dressing applied and wound care instructions given   Dressing type: bandage and petrolatum    Specimen 1 - Surgical pathology Differential Diagnosis: SCC vs other Check Margins: No 0.8cm crusted papule  Inflamed seborrheic keratosis Left brow x1  Destruction of lesion - Left brow x1 Complexity: simple   Destruction method: cryotherapy   Informed consent: discussed and consent obtained   Timeout:  patient name, date of birth, surgical site, and procedure verified Lesion destroyed using liquid nitrogen: Yes   Region frozen until ice ball extended beyond lesion: Yes   Outcome: patient tolerated procedure well with no complications   Post-procedure details: wound care instructions given    AK (actinic keratosis) Left Forehead x1  Destruction of lesion - Left Forehead x1 Complexity: simple   Destruction method: cryotherapy   Informed consent: discussed and consent obtained   Timeout:  patient name, date of birth, surgical site, and procedure verified Lesion destroyed using liquid nitrogen: Yes   Region frozen until ice ball extended beyond lesion: Yes  Outcome: patient tolerated procedure well with no complications   Post-procedure details: wound care instructions given    Basal cell carcinoma (BCC) of scalp  Actinic Damage - diffuse scaly erythematous  macules with underlying dyspigmentation - Recommend daily broad spectrum sunscreen SPF 30+ to sun-exposed areas, reapply every 2 hours as needed.  - Call for new or changing lesions.  Return in about 2 months (around 08/29/2020) for ISK, AK recheck.   I, Emelia Salisbury, CMA, am acting as scribe for Sarina Ser, MD.  Documentation: I have reviewed the above documentation for accuracy and completeness, and I agree with the above.  Sarina Ser, MD

## 2020-07-04 ENCOUNTER — Telehealth: Payer: Self-pay

## 2020-07-04 NOTE — Telephone Encounter (Signed)
Patient informed of pathology results 

## 2020-07-04 NOTE — Telephone Encounter (Signed)
-----   Message from Ralene Bathe, MD sent at 06/30/2020  7:33 PM EDT ----- Skin , left crown scalp BASAL CELL CARCINOMA, NODULAR PATTERN  Cancer - BCC Already treated Recheck next visit

## 2020-07-06 ENCOUNTER — Encounter: Payer: Self-pay | Admitting: Dermatology

## 2020-07-11 ENCOUNTER — Other Ambulatory Visit: Payer: Self-pay | Admitting: Internal Medicine

## 2020-07-12 ENCOUNTER — Other Ambulatory Visit: Payer: Self-pay | Admitting: Internal Medicine

## 2020-07-27 ENCOUNTER — Encounter: Payer: Self-pay | Admitting: Internal Medicine

## 2020-07-27 NOTE — Telephone Encounter (Signed)
LMTCB

## 2020-07-31 NOTE — Progress Notes (Deleted)
Cardiology Office Note  Date:  07/31/2020   ID:  JANI PLOEGER, DOB [birth year], MRN [id number]  PCP:  Einar Pheasant, MD   No chief complaint on file.   HPI:  [name] is a 86 yo male with a h/o  Myasthenia Gravis HTN,  diabetes, hemoglobin A1c 8.9 obesity ,  hyperlipidemia,  pernicious anemia, palpitations    hyponatremia Presenting for routine followup of his hypertension, diabetes  Fall with hip fracture, Had surgery , some soreness  HBA1c 7.2 Total chol 137, LDL 65 CR 0.7 Sodium 139  okay Weight loss, intentional In PT, for balance  Little swelling R>L Denies significant shortness of breath Does not like wearing compression hose, instead wears diabetic socks Lots of sitting  EKG personally reviewed by myself on todays visit NSR rate 73 bpm no significant ST or T-wave changes, left anterior fascicular block  Other PMH reviewed diagnosis of Myasthenia Gravis severe muscle weakness, arms, occular, facial had 5 treatments over 2 weeks, QOD On cellcept since Jan 2018 On today's visit reports that when he wakes up 1 of his eyelids is slow to get out of the way Through the course of the morning usually resolves and goes back to normal  Wife died 11/18/17, severe Parkinson's    PMH:   has a past medical history of Allergic rhinitis, Basal cell carcinoma (01/29/2007), BPH (benign prostatic hypertrophy), Degenerative arthritis, Diabetes mellitus ([facility]), GERD (gastroesophageal reflux disease), HTN (hypertension), Hypercholesterolemia, HYPERTENSION, BENIGN (08/15/2010), Iron deficiency anemia, Skin cancer (06/23/2014), Squamous cell carcinoma of skin (04/01/2007), Squamous cell carcinoma of skin (05/16/2016), TIA (transient ischemic attack) (09/13/2015), and Urinary outflow obstruction.  PSH:    Past Surgical History:  Procedure Laterality Date  . PARTIAL HIP ARTHROPLASTY  01/2019  . PILONIDAL CYST EXCISION  1951   removal  . [place]  .  SKIN CANCER EXCISION     multiple  . SQUAMOUS CELL CARCINOMA EXCISION     behind head  . TONSILLECTOMY AND ADENOIDECTOMY  1938    Current Outpatient Medications  Medication Sig Dispense Refill  . Accu-Chek Softclix Lancets lancets Use as instructed    . amLODipine (NORVASC) 5 MG tablet TAKE 1 TABLET(5 MG) BY MOUTH DAILY 30 tablet 3  . aspirin (ASPIR-LOW) 81 MG EC tablet Take 81 mg by mouth daily.      . calcium-vitamin D (OSCAL 500/200 D-3) 500-200 MG-UNIT per tablet Take 1 tablet by mouth 2 (two) times daily.     Marland Kitchen docusate sodium (COLACE) 100 MG capsule Take by mouth.    . finasteride (PROSCAR) 5 MG tablet TAKE 1 TABLET(5 MG) BY MOUTH DAILY 90 tablet 3  . glucose blood (ACCU-CHEK COMPACT PLUS) test strip     . lovastatin (MEVACOR) 20 MG tablet Take 1 tablet (20 mg total) by mouth at bedtime. 90 tablet 1  . metFORMIN (GLUCOPHAGE-XR) 500 MG 24 hr tablet TAKE 1 TABLET BY MOUTH EVERY DAY WITH BREAKFAST 90 tablet 0  . olmesartan (BENICAR) 40 MG tablet Take 1 tablet (40 mg total) by mouth daily. 30 tablet 2  . tamsulosin (FLOMAX) 0.4 MG CAPS capsule TAKE 1 CAPSULE(0.4 MG) BY MOUTH DAILY AFTER SUPPER 90 capsule 2  . UNABLE TO FIND Use once daily to check blood sugars Dx 250.00     No current facility-administered medications for this visit.     Allergies:   Magnesium and Penicillamine   Social History:  The patient  reports that he has never smoked. He has never  used smokeless tobacco. He reports that he does not drink alcohol and does not use drugs.   Family History:   family history includes Alzheimer's disease in his mother; CVA in his brother; Heart disease in his father.    Review of Systems: Review of Systems  Constitutional: Negative.   Respiratory: Negative.   Cardiovascular: Positive for leg swelling.  Gastrointestinal: Negative.   Musculoskeletal: Negative.   Neurological: Negative.   Psychiatric/Behavioral: Negative.   All other systems reviewed and are  negative.   PHYSICAL EXAM: VS:  There were no vitals taken for this visit. , BMI There is no height or weight on file to calculate BMI. Constitutional:  oriented to person, place, and time. No distress.  HENT:  Head: Grossly normal Eyes:  no discharge. No scleral icterus.  Neck: No JVD, no carotid bruits  Cardiovascular: Regular rate and rhythm, no murmurs appreciated Pulmonary/Chest: Clear to auscultation bilaterally, no wheezes or rails Abdominal: Soft.  no distension.  no tenderness.  Musculoskeletal: Normal range of motion Neurological:  normal muscle tone. Coordination normal. No atrophy Skin: Skin warm and dry Psychiatric: normal affect, pleasant   Recent Labs: 04/21/2020: ALT 17; BUN 15; Creatinine, Ser 0.77; Hemoglobin 12.6; Platelets 322.0; Sodium 135 05/13/2020: Potassium 5.1    Lipid Panel Lab Results  Component Value Date   CHOL 129 04/21/2020   HDL 50.40 04/21/2020   LDLCALC 55 04/21/2020   TRIG 120.0 04/21/2020      Wt Readings from Last 3 Encounters:  01/25/20 223 lb 3.2 oz (101.2 kg)  10/22/19 209 lb (94.8 kg)  09/23/19 214 lb (97.1 kg)     ASSESSMENT AND PLAN:  Hypercholesterolemia Cholesterol is at goal, continue current medication  HYPERTENSION, BENIGN Reports blood pressure well controlled at home Dramatic weight loss over the past year, no medication changes made  Type 2 diabetes mellitus without complication, without long-term current use of insulin (HCC) Reports weight down to 230s down to 211 Doing much better, eating less A1c improved  Obesity (BMI 30.0-34.9 Discussed with him again, trend of his weight coming down is wonderful Complemented him on better numbers  Myasthenia gravis ([place]) On CellCept Denies having any progression in his disease Recommended regular exercise program  Hyponatremia Stable, 139 Lab work reviewed with him  Leg swelling  dependent edema, would benefit from compression hose Again discussed with him,  very mild symptoms worse on the right Chronic issue   Total encounter time more than 25 minutes  Greater than 50% was spent in counseling and coordination of care with the patient   Disposition:   F/U  12 months   No orders of the defined types were placed in this encounter.    Signed, Esmond Plants, M.D., Ph.D. 07/31/2020  [place], [place]

## 2020-08-01 ENCOUNTER — Ambulatory Visit (INDEPENDENT_AMBULATORY_CARE_PROVIDER_SITE_OTHER): Payer: Medicare Other

## 2020-08-01 ENCOUNTER — Other Ambulatory Visit: Payer: Self-pay | Admitting: *Deleted

## 2020-08-01 ENCOUNTER — Ambulatory Visit: Payer: Medicare Other | Admitting: Cardiovascular Disease

## 2020-08-01 ENCOUNTER — Other Ambulatory Visit: Payer: Self-pay

## 2020-08-01 ENCOUNTER — Encounter: Payer: Self-pay | Admitting: Internal Medicine

## 2020-08-01 ENCOUNTER — Ambulatory Visit (INDEPENDENT_AMBULATORY_CARE_PROVIDER_SITE_OTHER): Payer: Medicare Other | Admitting: Internal Medicine

## 2020-08-01 VITALS — BP 136/68 | HR 80 | Temp 98.2°F | Resp 16 | Wt 227.2 lb

## 2020-08-01 DIAGNOSIS — G7000 Myasthenia gravis without (acute) exacerbation: Secondary | ICD-10-CM | POA: Diagnosis not present

## 2020-08-01 DIAGNOSIS — E1165 Type 2 diabetes mellitus with hyperglycemia: Secondary | ICD-10-CM

## 2020-08-01 DIAGNOSIS — I10 Essential (primary) hypertension: Secondary | ICD-10-CM

## 2020-08-01 DIAGNOSIS — R300 Dysuria: Secondary | ICD-10-CM

## 2020-08-01 DIAGNOSIS — E7800 Pure hypercholesterolemia, unspecified: Secondary | ICD-10-CM | POA: Diagnosis not present

## 2020-08-01 DIAGNOSIS — E119 Type 2 diabetes mellitus without complications: Secondary | ICD-10-CM | POA: Diagnosis not present

## 2020-08-01 DIAGNOSIS — R5383 Other fatigue: Secondary | ICD-10-CM

## 2020-08-01 DIAGNOSIS — D649 Anemia, unspecified: Secondary | ICD-10-CM

## 2020-08-01 DIAGNOSIS — R0602 Shortness of breath: Secondary | ICD-10-CM

## 2020-08-01 DIAGNOSIS — E871 Hypo-osmolality and hyponatremia: Secondary | ICD-10-CM | POA: Diagnosis not present

## 2020-08-01 DIAGNOSIS — J90 Pleural effusion, not elsewhere classified: Secondary | ICD-10-CM | POA: Diagnosis not present

## 2020-08-01 LAB — POCT URINALYSIS DIPSTICK
Bilirubin, UA: NEGATIVE
Blood, UA: NEGATIVE
Glucose, UA: NEGATIVE
Ketones, UA: NEGATIVE
Leukocytes, UA: NEGATIVE
Nitrite, UA: NEGATIVE
Protein, UA: POSITIVE — AB
Spec Grav, UA: 1.015 (ref 1.010–1.025)
Urobilinogen, UA: 2 E.U./dL — AB
pH, UA: 6.5 (ref 5.0–8.0)

## 2020-08-01 LAB — HEPATIC FUNCTION PANEL
ALT: 53 U/L (ref 0–53)
AST: 36 U/L (ref 0–37)
Albumin: 3.3 g/dL — ABNORMAL LOW (ref 3.5–5.2)
Alkaline Phosphatase: 37 U/L — ABNORMAL LOW (ref 39–117)
Bilirubin, Direct: 0.1 mg/dL (ref 0.0–0.3)
Total Bilirubin: 0.6 mg/dL (ref 0.2–1.2)
Total Protein: 6.5 g/dL (ref 6.0–8.3)

## 2020-08-01 LAB — HEMOGLOBIN A1C: Hgb A1c MFr Bld: 7.8 % — ABNORMAL HIGH (ref 4.6–6.5)

## 2020-08-01 LAB — BASIC METABOLIC PANEL
BUN: 15 mg/dL (ref 6–23)
CO2: 24 mEq/L (ref 19–32)
Calcium: 8.7 mg/dL (ref 8.4–10.5)
Chloride: 95 mEq/L — ABNORMAL LOW (ref 96–112)
Creatinine, Ser: 0.61 mg/dL (ref 0.40–1.50)
GFR: 123.67 mL/min (ref >60.00)
Glucose, Bld: 121 mg/dL — ABNORMAL HIGH (ref 70–99)
Potassium: 4.3 mEq/L (ref 3.5–5.1)
Sodium: 129 mEq/L — ABNORMAL LOW (ref 135–145)

## 2020-08-01 LAB — URINALYSIS, MICROSCOPIC ONLY: RBC / HPF: NONE SEEN (ref 0–?)

## 2020-08-01 LAB — TSH: TSH: 2.31 u[IU]/mL (ref 0.35–4.50)

## 2020-08-01 LAB — CBC WITH DIFFERENTIAL/PLATELET
Basophils Absolute: 0.1 10*3/uL (ref 0.0–0.1)
Basophils Relative: 1.1 % (ref 0.0–3.0)
Eosinophils Absolute: 0.2 10*3/uL (ref 0.0–0.7)
Eosinophils Relative: 2.8 % (ref 0.0–5.0)
HCT: 32.9 % — ABNORMAL LOW (ref 39.0–52.0)
Hemoglobin: 10.9 g/dL — ABNORMAL LOW (ref 13.0–17.0)
Lymphocytes Relative: 10.6 % — ABNORMAL LOW (ref 12.0–46.0)
Lymphs Abs: 0.9 10*3/uL (ref 0.7–4.0)
MCHC: 33.2 g/dL (ref 30.0–36.0)
MCV: 89.7 fl (ref 78.0–100.0)
Monocytes Absolute: 0.9 10*3/uL (ref 0.1–1.0)
Monocytes Relative: 9.9 % (ref 3.0–12.0)
Neutro Abs: 6.7 10*3/uL (ref 1.4–7.7)
Neutrophils Relative %: 75.6 % (ref 43.0–77.0)
Platelets: 439 10*3/uL — ABNORMAL HIGH (ref 150.0–400.0)
RBC: 3.66 Mil/uL — ABNORMAL LOW (ref 4.22–5.81)
RDW: 12.8 % (ref 11.5–15.5)
WBC: 8.8 10*3/uL (ref 4.0–10.5)

## 2020-08-01 NOTE — Progress Notes (Deleted)
Patient ID: [name], male   DOB: 09/19/1926, 86 y.o.   MRN: [id number]   Subjective:    Patient ID: [name], male    DOB: 1926-08-27, 86 y.o.   MRN: [id number]  HPI This visit occurred during the SARS-CoV-2 public health emergency.  Safety protocols were in place, including screening questions prior to the visit, additional usage of staff PPE, and extensive cleaning of exam room while observing appropriate contact time as indicated for disinfecting solutions.  Patient here for scheduled follow up appt.    Past Medical History:  Diagnosis Date   Allergic rhinitis    Basal cell carcinoma 01/29/2007   Right mid forehead. Excised: 03/24/2007, margins free.   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 Excised 07/27/2014, residual focus AFX, margins free.   Squamous cell carcinoma of skin 04/01/2007   Right forehead, 2cm above mid brow. WD SCC wtih superficial infiltration. Excised: 05/14/2007, margins free   Squamous cell carcinoma of skin 05/16/2016   Crown. WD SCC, ulcerated. Excised: 07/03/2016, residual MD SCC, deep margin involved. Excised: 07/17/2016, margins free.   TIA (transient ischemic attack) 09/13/2015   Urinary outflow obstruction    mild   Past Surgical History:  Procedure Laterality Date   PARTIAL HIP ARTHROPLASTY  01/2019   PILONIDAL CYST EXCISION  1951   removal   ROTATOR CUFF REPAIR  1995   SKIN CANCER EXCISION     multiple   SQUAMOUS CELL CARCINOMA EXCISION     behind head   TONSILLECTOMY AND ADENOIDECTOMY  1938   Family History  Problem Relation Age of Onset   Heart  disease Father        heart atack   Alzheimer's disease Mother    CVA Brother    Social History   Socioeconomic History   Marital status: Married    Spouse name: Not on file   Number of children: Not on file   Years of education: Not on file   Highest education level: Not on file  Occupational History   Not on file  Tobacco Use   Smoking status: Never Smoker   Smokeless tobacco: Never Used  Substance and Sexual Activity   Alcohol use: No    Alcohol/week: 0.0 standard drinks   Drug use: No   Sexual activity: Not on file  Other Topics Concern   Not on file  Social History Narrative   Retired, married. Walks everyday         Social Determinants of Health   Financial Resource Strain:    Difficulty of Paying Living Expenses: Not on file  Food Insecurity:    Worried About Charity fundraiser in the Last Year: Not on file   [employer] of Food in the Last Year: Not on file  Transportation Needs:    Lack of Transportation (Medical): Not on file   Lack of Transportation (Non-Medical): Not on file  Physical Activity:    Days of Exercise per Week: Not on file   Minutes of Exercise per Session: Not on file  Stress:    Feeling of Stress : Not on file  Social Connections:    Frequency of Communication with Friends and Family: Not on file   Frequency of Social Gatherings with Friends and Family: Not on file   Attends Religious Services: Not on file   Active Member of Clubs or Organizations: Not on file   Attends Archivist Meetings: Not on file   Marital Status: Not on file    Outpatient Encounter Medications as of 08/01/2020  Medication Sig   Accu-Chek Softclix Lancets lancets Use as instructed   amLODipine (NORVASC) 5 MG tablet TAKE 1 TABLET(5 MG) BY MOUTH DAILY   aspirin (ASPIR-LOW) 81 MG EC tablet Take 81 mg by mouth daily.     calcium-vitamin D (OSCAL 500/200 D-3) 500-200 MG-UNIT per tablet Take 1 tablet by mouth 2 (two) times  daily.    docusate sodium (COLACE) 100 MG capsule Take by mouth.   finasteride (PROSCAR) 5 MG tablet TAKE 1 TABLET(5 MG) BY MOUTH DAILY   glucose blood (ACCU-CHEK COMPACT PLUS) test strip    lovastatin (MEVACOR) 20 MG tablet Take 1 tablet (20 mg total) by mouth at bedtime.   metFORMIN (GLUCOPHAGE-XR) 500 MG 24 hr tablet TAKE 1 TABLET BY MOUTH EVERY DAY WITH BREAKFAST   olmesartan (BENICAR) 40 MG tablet Take 1 tablet (40 mg total) by mouth daily.   tamsulosin (FLOMAX) 0.4 MG CAPS capsule TAKE 1 CAPSULE(0.4 MG) BY MOUTH DAILY AFTER SUPPER   UNABLE TO FIND Use once daily to check blood sugars Dx 250.00   No facility-administered encounter medications on file as of 08/01/2020.    Review of Systems     Objective:    Physical Exam  BP (!) 158/70    Pulse 80    Temp 98.2 F (36.8 C) (Oral)    Resp 16    Wt 227 lb 3.2 oz (103.1 kg)    SpO2 94%    BMI 31.69 kg/m  Wt Readings from Last 3 Encounters:  08/01/20 227 lb 3.2 oz (103.1 kg)  01/25/20 223 lb 3.2 oz (101.2 kg)  10/22/19 209 lb (94.8 kg)     Lab Results  Component Value Date   WBC 7.1 04/21/2020   HGB 12.6 (L) 04/21/2020   HCT 37.9 (L) 04/21/2020   PLT 322.0 04/21/2020   GLUCOSE 154 (H) 04/21/2020   CHOL 129 04/21/2020   TRIG 120.0 04/21/2020   HDL 50.40 04/21/2020   LDLCALC 55 04/21/2020   ALT 17 04/21/2020   AST 17 04/21/2020   NA 135 04/21/2020   K 5.1 05/13/2020   CL 99 04/21/2020   CREATININE 0.77 04/21/2020   BUN 15 04/21/2020   CO2 29 04/21/2020   TSH 3.52 07/23/2019   PSA 0.02 (L) 04/16/2018   HGBA1C 7.4 (H) 04/21/2020   MICROALBUR 7.6 (H) 07/23/2019    MR LUMBAR SPINE WO CONTRAST  Addendum Date: 05/01/2018   ADDENDUM REPORT: 05/01/2018 17:12 ADDENDUM: Chronic T12, L2, L3, and L5 compression fractures. Electronically Signed   By: Logan Bores M.D.   On: 05/01/2018 17:12   Result Date: 05/01/2018 CLINICAL DATA:  Lumbar radiculopathy. Low back and left leg pain for couple of months. EXAM: MRI  LUMBAR SPINE WITHOUT CONTRAST TECHNIQUE: Multiplanar, multisequence MR imaging of the lumbar spine was performed. No intravenous contrast was administered. COMPARISON:  None. FINDINGS: Segmentation:  Standard. Alignment: Mild lumbar levoscoliosis. 3 mm anterolisthesis of L3 on L4. Vertebrae: T12, L2, L3, and L5 compression fractures with mild-to-moderate vertebral body height loss and no significant marrow edema. 3 mm retropulsion of  the posterosuperior T12 vertebral body. Mild periarticular marrow and soft tissue edema associated with left L5-S1 facet arthritis. Conus medullaris and cauda equina: Conus extends to the lower L1 level. Conus and cauda equina appear normal. Paraspinal and other soft tissues: Mild left lower lumbar posterior paraspinal muscle, possibly strain. Disc levels: Disc desiccation throughout the lower thoracic and lumbar spine. Mild disc space narrowing at L2-3 and L4-5. T11-12: Mild T12 superior endplate retropulsion, mild disc bulging, and mild facet arthrosis result in borderline to mild spinal stenosis without neural foraminal stenosis. T12-L1: Mild disc bulging and moderate facet hypertrophy without stenosis. L1-2: Mild circumferential disc bulging and moderate facet hypertrophy result in minimal right neural foraminal narrowing without spinal stenosis. L2-3: Circumferential disc bulging, a broad right subarticular to foraminal disc protrusion, and moderate facet hypertrophy result in minimal right lateral recess narrowing without spinal or neural foraminal stenosis. L3-4: Anterolisthesis with slight bulging of uncovered disc, mildly prominent dorsal epidural fat, and severe facet hypertrophy result in mild-to-moderate spinal stenosis, mild bilateral lateral recess stenosis, and mild bilateral neural foraminal stenosis. There is likely facet ankylosis bilaterally. L4-5: Disc bulging, mild ligamentum flavum hypertrophy, and moderate left greater than right facet hypertrophy result in mild  spinal stenosis, mild-to-moderate left greater than right lateral recess stenosis, and minimal bilateral neural foraminal stenosis. L5-S1: Disc bulging and severe left greater than right facet arthrosis result in mild left lateral recess stenosis and mild right and severe left neural foraminal stenosis with potential left L5 nerve root impingement. There is a left facet joint effusion, and a 6 x 3 mm synovial cyst is present anterior to the left facet joint in close proximity to the exiting left L5 nerve at the lateral aspect of the foramen though without frank nerve compression. IMPRESSION: 1. Severe left L5-S1 facet arthritis with associated joint effusion and edema. Severe left neural foraminal stenosis with potential left L5 nerve root impingement. 2. Mild-to-moderate spinal and lateral recess stenosis at L3-4 and L4-5. Electronically Signed: By: Logan Bores M.D. On: 05/01/2018 13:20       Assessment & Plan:   Problem List Items Addressed This Visit    None    Visit Diagnoses    Dysuria    -  Primary   Relevant Orders   POCT Urinalysis Dipstick (Completed)   Urine Culture   Urine Microscopic Only       Einar Pheasant, MD

## 2020-08-02 ENCOUNTER — Telehealth: Payer: Self-pay | Admitting: Internal Medicine

## 2020-08-02 ENCOUNTER — Other Ambulatory Visit: Payer: Self-pay | Admitting: Internal Medicine

## 2020-08-02 DIAGNOSIS — E871 Hypo-osmolality and hyponatremia: Secondary | ICD-10-CM

## 2020-08-02 DIAGNOSIS — D649 Anemia, unspecified: Secondary | ICD-10-CM

## 2020-08-02 LAB — URINE CULTURE
MICRO NUMBER:: 10999129
Result:: NO GROWTH
SPECIMEN QUALITY:: ADEQUATE

## 2020-08-02 MED ORDER — FUROSEMIDE 20 MG PO TABS: 20.0000 mg | ORAL_TABLET | Freq: Every day | ORAL | 0 refills | Status: DC

## 2020-08-02 NOTE — Progress Notes (Signed)
Orders placed for f/u labs.  

## 2020-08-02 NOTE — Telephone Encounter (Signed)
Pt would like a call back immediately. He would not disclose what it was about. He only said that you have been communicating back and forth.

## 2020-08-02 NOTE — Progress Notes (Signed)
Done

## 2020-08-03 ENCOUNTER — Ambulatory Visit: Payer: Medicare Other

## 2020-08-03 NOTE — Telephone Encounter (Signed)
See result note.  

## 2020-08-04 ENCOUNTER — Other Ambulatory Visit: Payer: Self-pay

## 2020-08-04 ENCOUNTER — Other Ambulatory Visit (INDEPENDENT_AMBULATORY_CARE_PROVIDER_SITE_OTHER): Payer: Medicare Other

## 2020-08-04 DIAGNOSIS — D649 Anemia, unspecified: Secondary | ICD-10-CM | POA: Diagnosis not present

## 2020-08-04 DIAGNOSIS — E871 Hypo-osmolality and hyponatremia: Secondary | ICD-10-CM

## 2020-08-04 LAB — BASIC METABOLIC PANEL
BUN: 14 mg/dL (ref 6–23)
CO2: 23 mEq/L (ref 19–32)
Calcium: 8.6 mg/dL (ref 8.4–10.5)
Chloride: 94 mEq/L — ABNORMAL LOW (ref 96–112)
Creatinine, Ser: 0.63 mg/dL (ref 0.40–1.50)
GFR: 119.15 mL/min (ref >60.00)
Glucose, Bld: 199 mg/dL — ABNORMAL HIGH (ref 70–99)
Potassium: 4.1 mEq/L (ref 3.5–5.1)
Sodium: 128 mEq/L — ABNORMAL LOW (ref 135–145)

## 2020-08-04 LAB — CBC WITH DIFFERENTIAL/PLATELET
Basophils Absolute: 0.1 10*3/uL (ref 0.0–0.1)
Basophils Relative: 1 % (ref 0.0–3.0)
Eosinophils Absolute: 0.2 10*3/uL (ref 0.0–0.7)
Eosinophils Relative: 2.5 % (ref 0.0–5.0)
HCT: 33.1 % — ABNORMAL LOW (ref 39.0–52.0)
Hemoglobin: 11 g/dL — ABNORMAL LOW (ref 13.0–17.0)
Lymphocytes Relative: 7.5 % — ABNORMAL LOW (ref 12.0–46.0)
Lymphs Abs: 0.6 10*3/uL — ABNORMAL LOW (ref 0.7–4.0)
MCHC: 33.2 g/dL (ref 30.0–36.0)
MCV: 89.4 fl (ref 78.0–100.0)
Monocytes Absolute: 0.7 10*3/uL (ref 0.1–1.0)
Monocytes Relative: 7.9 % (ref 3.0–12.0)
Neutro Abs: 7.1 10*3/uL (ref 1.4–7.7)
Neutrophils Relative %: 81.1 % — ABNORMAL HIGH (ref 43.0–77.0)
Platelets: 454 10*3/uL — ABNORMAL HIGH (ref 150.0–400.0)
RBC: 3.7 Mil/uL — ABNORMAL LOW (ref 4.22–5.81)
RDW: 13.1 % (ref 11.5–15.5)
WBC: 8.7 10*3/uL (ref 4.0–10.5)

## 2020-08-04 LAB — IBC + FERRITIN
Ferritin: 228.2 ng/mL (ref 22.0–322.0)
Iron: 24 ug/dL — ABNORMAL LOW (ref 42–165)
Saturation Ratios: 10.9 % — ABNORMAL LOW (ref 20.0–50.0)
Transferrin: 157 mg/dL — ABNORMAL LOW (ref 212.0–360.0)

## 2020-08-05 ENCOUNTER — Other Ambulatory Visit: Payer: Self-pay | Admitting: Internal Medicine

## 2020-08-05 ENCOUNTER — Telehealth: Payer: Self-pay | Admitting: Internal Medicine

## 2020-08-05 DIAGNOSIS — E871 Hypo-osmolality and hyponatremia: Secondary | ICD-10-CM

## 2020-08-05 NOTE — Progress Notes (Signed)
Order placed for f/u sodium.  ?

## 2020-08-05 NOTE — Telephone Encounter (Signed)
See result note.  

## 2020-08-05 NOTE — Telephone Encounter (Signed)
Patient called  In stated that the furosemide (LASIX) 20 MG tablet is fine but wanted to know what to do about the lab results

## 2020-08-08 ENCOUNTER — Telehealth: Payer: Self-pay | Admitting: *Deleted

## 2020-08-08 DIAGNOSIS — E871 Hypo-osmolality and hyponatremia: Secondary | ICD-10-CM

## 2020-08-08 NOTE — Telephone Encounter (Signed)
Please place future orders for lab appt.  

## 2020-08-08 NOTE — Telephone Encounter (Signed)
Order placed for sodium check.

## 2020-08-09 ENCOUNTER — Other Ambulatory Visit: Payer: Self-pay

## 2020-08-09 ENCOUNTER — Other Ambulatory Visit (INDEPENDENT_AMBULATORY_CARE_PROVIDER_SITE_OTHER): Payer: Medicare Other

## 2020-08-09 DIAGNOSIS — E871 Hypo-osmolality and hyponatremia: Secondary | ICD-10-CM

## 2020-08-09 LAB — SODIUM: Sodium: 127 mEq/L — ABNORMAL LOW (ref 135–145)

## 2020-08-10 ENCOUNTER — Other Ambulatory Visit: Payer: Self-pay | Admitting: Internal Medicine

## 2020-08-10 DIAGNOSIS — E871 Hypo-osmolality and hyponatremia: Secondary | ICD-10-CM

## 2020-08-10 DIAGNOSIS — E7800 Pure hypercholesterolemia, unspecified: Secondary | ICD-10-CM

## 2020-08-10 NOTE — Progress Notes (Signed)
Order placed for f/u labs.  

## 2020-08-11 ENCOUNTER — Other Ambulatory Visit: Payer: Self-pay

## 2020-08-11 ENCOUNTER — Other Ambulatory Visit (INDEPENDENT_AMBULATORY_CARE_PROVIDER_SITE_OTHER): Payer: Medicare Other

## 2020-08-11 DIAGNOSIS — E7800 Pure hypercholesterolemia, unspecified: Secondary | ICD-10-CM | POA: Diagnosis not present

## 2020-08-11 DIAGNOSIS — E871 Hypo-osmolality and hyponatremia: Secondary | ICD-10-CM

## 2020-08-11 LAB — BASIC METABOLIC PANEL
BUN: 10 mg/dL (ref 6–23)
CO2: 27 mEq/L (ref 19–32)
Calcium: 9.4 mg/dL (ref 8.4–10.5)
Chloride: 94 mEq/L — ABNORMAL LOW (ref 96–112)
Creatinine, Ser: 0.65 mg/dL (ref 0.40–1.50)
GFR: 84.54 mL/min (ref >60.00)
Glucose, Bld: 144 mg/dL — ABNORMAL HIGH (ref 70–99)
Potassium: 4.9 mEq/L (ref 3.5–5.1)
Sodium: 132 mEq/L — ABNORMAL LOW (ref 135–145)

## 2020-08-11 LAB — LIPID PANEL
Cholesterol: 142 mg/dL (ref 0–200)
HDL: 38.3 mg/dL — ABNORMAL LOW (ref >39.00)
LDL Cholesterol: 78 mg/dL (ref 0–99)
NonHDL: 104.09
Total CHOL/HDL Ratio: 4
Triglycerides: 131 mg/dL (ref 0.0–149.0)
VLDL: 26.2 mg/dL (ref 0.0–40.0)

## 2020-08-11 LAB — CORTISOL: Cortisol, Plasma: 17.1 ug/dL

## 2020-08-13 LAB — SODIUM, URINE, RANDOM: Sodium, Ur: 73 mmol/L (ref 28–272)

## 2020-08-13 LAB — OSMOLALITY: Osmolality: 279 mOsm/kg (ref 278–305)

## 2020-08-13 LAB — OSMOLALITY, URINE: Osmolality, Ur: 343 mOsm/kg (ref 50–1200)

## 2020-08-14 ENCOUNTER — Encounter: Payer: Self-pay | Admitting: Internal Medicine

## 2020-08-14 NOTE — Assessment & Plan Note (Signed)
Off hctz. Had been better.  Recheck today.

## 2020-08-14 NOTE — Assessment & Plan Note (Signed)
Low carb diet and exercise.  Follow met b and a1c.  Sugars have been in acceptable range - (pt 91).

## 2020-08-14 NOTE — Assessment & Plan Note (Signed)
Blood pressure on recheck improved.  Continue benicar and amlodipine.  Follow pressures.  Follow metabolic panel.

## 2020-08-14 NOTE — Assessment & Plan Note (Signed)
Increased fatigue with decreased energy.  EKG as outlined.  Plan f/u with cardiology.  Check routine labs.

## 2020-08-14 NOTE — Assessment & Plan Note (Signed)
Has been on cellcept.  Followed by neurology.  Has been stable.

## 2020-08-14 NOTE — Assessment & Plan Note (Signed)
Follow cbc.  

## 2020-08-14 NOTE — Progress Notes (Signed)
Patient ID: DONTAE MINERVA, male   DOB: 22-May-1926, 86 y.o.   MRN: [id number]   Subjective:    Patient ID: TEVYN CODD, male    DOB: September 03, 1926, 86 y.o.   MRN: [id number]  HPI This visit occurred during the SARS-CoV-2 public health emergency.  Safety protocols were in place, including screening questions prior to the visit, additional usage of staff PPE, and extensive cleaning of exam room while observing appropriate contact time as indicated for disinfecting solutions.  Patient here for as a work in with concerns regarding urination issues and decreased energy.    He is accompanied by his daughter-n-law.  History obtained from both of them.  He reports that approximately one week ago, he noticed decreased energy.  Daughter-n-law feels may have noticed some change for a little longer, but has worsened recently.  No chest pain reported.  He is eating.  Has noticed sob with exertion.  No increased cough or congestion.  No abdominal pain.  Bowels moving.  Sugars ok.  Swelling.    Past Medical History:  Diagnosis Date  . Allergic rhinitis   . Basal cell carcinoma 01/29/2007   Right mid forehead. Excised: 03/24/2007, margins free.  Marland Kitchen BPH (benign prostatic hypertrophy)   . Degenerative arthritis   . Diabetes mellitus ([place])   . GERD (gastroesophageal reflux disease)   . HTN (hypertension)   . Hypercholesterolemia   . HYPERTENSION, BENIGN 08/15/2010   Qualifier: Diagnosis of  By: Rockey Situ MD, Tim    . Iron deficiency anemia   . 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 Excised 07/27/2014, residual focus AFX, margins free.  Marland Kitchen Squamous cell carcinoma of skin 04/01/2007   Right forehead, 2cm above mid brow. WD SCC wtih superficial infiltration. Excised: 05/14/2007, margins free  . Squamous cell carcinoma of skin 05/16/2016   Crown. WD SCC, ulcerated. Excised: 07/03/2016, residual MD SCC, deep margin involved. Excised: 07/17/2016,  margins free.  Marland Kitchen TIA (transient ischemic attack) 09/13/2015  . Urinary outflow obstruction    mild   Past Surgical History:  Procedure Laterality Date  . PARTIAL HIP ARTHROPLASTY  01/2019  . PILONIDAL CYST EXCISION  1951   removal  . [place]  . SKIN CANCER EXCISION     multiple  . SQUAMOUS CELL CARCINOMA EXCISION     behind head  . TONSILLECTOMY AND ADENOIDECTOMY  1938   Family History  Problem Relation Age of Onset  . Heart disease Father        heart atack  . Alzheimer's disease Mother   . CVA Brother    Social History   Socioeconomic History  . Marital status: Married    Spouse name: Not on file  . Number of children: Not on file  . Years of education: Not on file  . Highest education level: Not on file  Occupational History  . Not on file  Tobacco Use  . Smoking status: Never Smoker  . Smokeless tobacco: Never Used  Substance and Sexual Activity  . Alcohol use: No    Alcohol/week: 0.0 standard drinks  . Drug use: No  . Sexual activity: Not on file  Other Topics Concern  . Not on file  Social History Narrative   Retired, married. Walks everyday         Social Determinants of Health   Financial Resource Strain:   . Difficulty of Paying Living Expenses: Not on file  Food Insecurity:   .  Worried About Charity fundraiser in the Last Year: Not on file  . Ran Out of Food in the Last Year: Not on file  Transportation Needs:   . Lack of Transportation (Medical): Not on file  . Lack of Transportation (Non-Medical): Not on file  Physical Activity:   . Days of Exercise per Week: Not on file  . Minutes of Exercise per Session: Not on file  Stress:   . Feeling of Stress : Not on file  Social Connections:   . Frequency of Communication with Friends and Family: Not on file  . Frequency of Social Gatherings with Friends and Family: Not on file  . Attends Religious Services: Not on file  . Active Member of Clubs or Organizations: Not on file  .  Attends Archivist Meetings: Not on file  . Marital Status: Not on file    Outpatient Encounter Medications as of 08/01/2020  Medication Sig  . Accu-Chek Softclix Lancets lancets Use as instructed  . amLODipine (NORVASC) 5 MG tablet TAKE 1 TABLET(5 MG) BY MOUTH DAILY  . aspirin (ASPIR-LOW) 81 MG EC tablet Take 81 mg by mouth daily.    . calcium-vitamin D (OSCAL 500/200 D-3) 500-200 MG-UNIT per tablet Take 1 tablet by mouth 2 (two) times daily.   Marland Kitchen docusate sodium (COLACE) 100 MG capsule Take by mouth.  . finasteride (PROSCAR) 5 MG tablet TAKE 1 TABLET(5 MG) BY MOUTH DAILY  . glucose blood (ACCU-CHEK COMPACT PLUS) test strip   . lovastatin (MEVACOR) 20 MG tablet Take 1 tablet (20 mg total) by mouth at bedtime.  . metFORMIN (GLUCOPHAGE-XR) 500 MG 24 hr tablet TAKE 1 TABLET BY MOUTH EVERY DAY WITH BREAKFAST  . olmesartan (BENICAR) 40 MG tablet Take 1 tablet (40 mg total) by mouth daily.  . tamsulosin (FLOMAX) 0.4 MG CAPS capsule TAKE 1 CAPSULE(0.4 MG) BY MOUTH DAILY AFTER SUPPER  . UNABLE TO FIND Use once daily to check blood sugars Dx 250.00   No facility-administered encounter medications on file as of 08/01/2020.    Review of Systems  Constitutional: Positive for fatigue. Negative for unexpected weight change.  HENT: Negative for congestion and sinus pressure.   Respiratory: Positive for shortness of breath. Negative for cough and chest tightness.   Cardiovascular: Positive for leg swelling. Negative for chest pain and palpitations.  Gastrointestinal: Negative for abdominal pain, diarrhea, nausea and vomiting.  Genitourinary: Negative for dysuria.       Noticed some change previously with urination.   Musculoskeletal: Negative for joint swelling and myalgias.  Skin: Negative for color change and rash.  Neurological: Negative for dizziness and headaches.  Psychiatric/Behavioral: Negative for agitation and dysphoric mood.       Objective:    Physical Exam Vitals  reviewed.  Constitutional:      General: He is not in acute distress.    Appearance: Normal appearance. He is well-developed.  HENT:     Head: Normocephalic and atraumatic.     Right Ear: External ear normal.     Left Ear: External ear normal.  Eyes:     General: No scleral icterus.       Right eye: No discharge.        Left eye: No discharge.     Conjunctiva/sclera: Conjunctivae normal.  Cardiovascular:     Rate and Rhythm: Normal rate and regular rhythm.  Pulmonary:     Effort: Pulmonary effort is normal. No respiratory distress.     Breath sounds: Normal breath  sounds.  Abdominal:     General: Bowel sounds are normal.     Palpations: Abdomen is soft.     Tenderness: There is no abdominal tenderness.  Musculoskeletal:        General: No tenderness.     Comments: Pedal and lower extremity swelling.    Skin:    Findings: No erythema or rash.  Neurological:     Mental Status: He is alert.  Psychiatric:        Mood and Affect: Mood normal.        Behavior: Behavior normal.     BP 136/68   Pulse 80   Temp 98.2 F (36.8 C) (Oral)   Resp 16   Wt 227 lb 3.2 oz (103.1 kg)   SpO2 97%   BMI 31.69 kg/m  Wt Readings from Last 3 Encounters:  08/01/20 227 lb 3.2 oz (103.1 kg)  01/25/20 223 lb 3.2 oz (101.2 kg)  10/22/19 209 lb (94.8 kg)     Lab Results  Component Value Date   WBC 8.7 08/04/2020   HGB 11.0 (L) 08/04/2020   HCT 33.1 (L) 08/04/2020   PLT 454.0 (H) 08/04/2020   GLUCOSE 144 (H) 08/11/2020   CHOL 142 08/11/2020   TRIG 131.0 08/11/2020   HDL 38.30 (L) 08/11/2020   LDLCALC 78 08/11/2020   ALT 53 08/01/2020   AST 36 08/01/2020   NA 132 (L) 08/11/2020   K 4.9 08/11/2020   CL 94 (L) 08/11/2020   CREATININE 0.65 08/11/2020   BUN 10 08/11/2020   CO2 27 08/11/2020   TSH 2.31 08/01/2020   PSA 0.02 (L) 04/16/2018   HGBA1C 7.8 (H) 08/01/2020   MICROALBUR 7.6 (H) 07/23/2019    MR LUMBAR SPINE WO CONTRAST  Addendum Date: 05/01/2018   ADDENDUM REPORT:  05/01/2018 17:12 ADDENDUM: Chronic T12, L2, L3, and L5 compression fractures. Electronically Signed   By: Logan Bores M.D.   On: 05/01/2018 17:12   Result Date: 05/01/2018 CLINICAL DATA:  Lumbar radiculopathy. Low back and left leg pain for couple of months. EXAM: MRI LUMBAR SPINE WITHOUT CONTRAST TECHNIQUE: Multiplanar, multisequence MR imaging of the lumbar spine was performed. No intravenous contrast was administered. COMPARISON:  None. FINDINGS: Segmentation:  Standard. Alignment: Mild lumbar levoscoliosis. 3 mm anterolisthesis of L3 on L4. Vertebrae: T12, L2, L3, and L5 compression fractures with mild-to-moderate vertebral body height loss and no significant marrow edema. 3 mm retropulsion of the posterosuperior T12 vertebral body. Mild periarticular marrow and soft tissue edema associated with left L5-S1 facet arthritis. Conus medullaris and cauda equina: Conus extends to the lower L1 level. Conus and cauda equina appear normal. Paraspinal and other soft tissues: Mild left lower lumbar posterior paraspinal muscle, possibly strain. Disc levels: Disc desiccation throughout the lower thoracic and lumbar spine. Mild disc space narrowing at L2-3 and L4-5. T11-12: Mild T12 superior endplate retropulsion, mild disc bulging, and mild facet arthrosis result in borderline to mild spinal stenosis without neural foraminal stenosis. T12-L1: Mild disc bulging and moderate facet hypertrophy without stenosis. L1-2: Mild circumferential disc bulging and moderate facet hypertrophy result in minimal right neural foraminal narrowing without spinal stenosis. L2-3: Circumferential disc bulging, a broad right subarticular to foraminal disc protrusion, and moderate facet hypertrophy result in minimal right lateral recess narrowing without spinal or neural foraminal stenosis. L3-4: Anterolisthesis with slight bulging of uncovered disc, mildly prominent dorsal epidural fat, and severe facet hypertrophy result in mild-to-moderate  spinal stenosis, mild bilateral lateral recess stenosis, and mild bilateral  neural foraminal stenosis. There is likely facet ankylosis bilaterally. L4-5: Disc bulging, mild ligamentum flavum hypertrophy, and moderate left greater than right facet hypertrophy result in mild spinal stenosis, mild-to-moderate left greater than right lateral recess stenosis, and minimal bilateral neural foraminal stenosis. L5-S1: Disc bulging and severe left greater than right facet arthrosis result in mild left lateral recess stenosis and mild right and severe left neural foraminal stenosis with potential left L5 nerve root impingement. There is a left facet joint effusion, and a 6 x 3 mm synovial cyst is present anterior to the left facet joint in close proximity to the exiting left L5 nerve at the lateral aspect of the foramen though without frank nerve compression. IMPRESSION: 1. Severe left L5-S1 facet arthritis with associated joint effusion and edema. Severe left neural foraminal stenosis with potential left L5 nerve root impingement. 2. Mild-to-moderate spinal and lateral recess stenosis at L3-4 and L4-5. Electronically Signed: By: Logan Bores M.D. On: 05/01/2018 13:20       Assessment & Plan:   Problem List Items Addressed This Visit    SOB (shortness of breath)    Has noticed increased sob with exertion and decreased energy.  EKG - SR with PVC.  Dr Rockey Situ reviewed as well. No acute ischemic changes.  Schedule f/u with cardiology.  Obtain labs.  Lasix x 3 days.  Follow.  Check cxr.        Relevant Orders   EKG 12-Lead (Completed)   DG Chest 2 View (Completed)   Myasthenia gravis ([place])    Has been on cellcept.  Followed by neurology.  Has been stable.       Hyponatremia    Off hctz. Had been better.  Recheck today.       HYPERTENSION, BENIGN    Blood pressure on recheck improved.  Continue benicar and amlodipine.  Follow pressures.  Follow metabolic panel.       Hypercholesterolemia    On lovastatin.   Low cholesterol diet.  Follow lipid panel.       Fatigue    Increased fatigue with decreased energy.  EKG as outlined.  Plan f/u with cardiology.  Check routine labs.       Diabetes mellitus ([place])    Low carb diet and exercise.  Follow met b and a1c.  Sugars have been in acceptable range - (pt 91).        Relevant Orders   Basic Metabolic Panel (BMET) (Completed)   Anemia    Follow cbc.        Other Visit Diagnoses    Dysuria    -  Primary   Relevant Orders   POCT Urinalysis Dipstick (Completed)   Urine Culture (Completed)   Urine Microscopic Only (Completed)       Einar Pheasant, MD

## 2020-08-14 NOTE — Assessment & Plan Note (Signed)
On lovastatin.  Low cholesterol diet.  Follow lipid panel.

## 2020-08-14 NOTE — Assessment & Plan Note (Signed)
Has noticed increased sob with exertion and decreased energy.  EKG - SR with PVC.  Dr Rockey Situ reviewed as well. No acute ischemic changes.  Schedule f/u with cardiology.  Obtain labs.  Lasix x 3 days.  Follow.  Check cxr.

## 2020-08-17 ENCOUNTER — Other Ambulatory Visit: Payer: Medicare Other

## 2020-08-19 ENCOUNTER — Ambulatory Visit (INDEPENDENT_AMBULATORY_CARE_PROVIDER_SITE_OTHER): Payer: Medicare Other | Admitting: Internal Medicine

## 2020-08-19 ENCOUNTER — Encounter: Payer: Self-pay | Admitting: Internal Medicine

## 2020-08-19 ENCOUNTER — Other Ambulatory Visit: Payer: Self-pay

## 2020-08-19 VITALS — BP 128/62 | HR 83 | Temp 97.7°F | Ht 71.0 in | Wt 217.0 lb

## 2020-08-19 DIAGNOSIS — D649 Anemia, unspecified: Secondary | ICD-10-CM | POA: Diagnosis not present

## 2020-08-19 DIAGNOSIS — G7000 Myasthenia gravis without (acute) exacerbation: Secondary | ICD-10-CM | POA: Diagnosis not present

## 2020-08-19 DIAGNOSIS — I10 Essential (primary) hypertension: Secondary | ICD-10-CM

## 2020-08-19 DIAGNOSIS — E7800 Pure hypercholesterolemia, unspecified: Secondary | ICD-10-CM

## 2020-08-19 DIAGNOSIS — E1165 Type 2 diabetes mellitus with hyperglycemia: Secondary | ICD-10-CM | POA: Diagnosis not present

## 2020-08-19 DIAGNOSIS — Z23 Encounter for immunization: Secondary | ICD-10-CM | POA: Diagnosis not present

## 2020-08-19 DIAGNOSIS — E871 Hypo-osmolality and hyponatremia: Secondary | ICD-10-CM | POA: Diagnosis not present

## 2020-08-19 DIAGNOSIS — R0602 Shortness of breath: Secondary | ICD-10-CM | POA: Diagnosis not present

## 2020-08-19 NOTE — Progress Notes (Signed)
Patient ID: [name], male   DOB: 02-24-1926, 86 y.o.   MRN: [id number]   Subjective:    Patient ID: [name], male    DOB: 14-Jul-1926, 86 y.o.   MRN: [id number]  HPI This visit occurred during the SARS-CoV-2 public health emergency.  Safety protocols were in place, including screening questions prior to the visit, additional usage of staff PPE, and extensive cleaning of exam room while observing appropriate contact time as indicated for disinfecting solutions.  Patient here for a scheduled follow up.  He is accompanied by his daughter-n-law.  History obtained form both of them.  Overall feels better. Breathing better.  More energy.  Stamina better.  Weight is down.  Took lasix for a few days after last visit.  "really helped".  Has f/u with Dr Rockey Situ soon.  No chest pain.  No increased cough or congestion.  Appetite appears to be improving.  Sodium better last check.  On iron now.  Bowels moving.  Some minimal constipation, but senna and addition of prune juice controlling.    Past Medical History:  Diagnosis Date   Allergic rhinitis    Basal cell carcinoma 01/29/2007   Right mid forehead. Excised: 03/24/2007, margins free.   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 Excised 07/27/2014, residual focus AFX, margins free.   Squamous cell carcinoma of skin 04/01/2007   Right forehead, 2cm above mid brow. WD SCC wtih superficial infiltration. Excised: 05/14/2007, margins free   Squamous cell carcinoma of skin 05/16/2016   Crown. WD SCC, ulcerated. Excised: 07/03/2016, residual MD SCC, deep margin involved. Excised:  07/17/2016, margins free.   TIA (transient ischemic attack) 09/13/2015   Urinary outflow obstruction    mild   Past Surgical History:  Procedure Laterality Date   PARTIAL HIP ARTHROPLASTY  01/2019   PILONIDAL CYST EXCISION  1951   removal   ROTATOR CUFF REPAIR  1995   SKIN CANCER EXCISION     multiple   SQUAMOUS CELL CARCINOMA EXCISION     behind head   TONSILLECTOMY AND ADENOIDECTOMY  1938   Family History  Problem Relation Age of Onset   Heart disease Father        heart atack   Alzheimer's disease Mother    CVA Brother    Social History   Socioeconomic History   Marital status: Married    Spouse name: Not on file   Number of children: Not on file   Years of education: Not on file   Highest education level: Not on file  Occupational History   Not on file  Tobacco Use   Smoking status: Never Smoker   Smokeless tobacco: Never Used  Substance and Sexual Activity   Alcohol use: No    Alcohol/week: 0.0 standard drinks   Drug use: No   Sexual activity: Not on file  Other Topics Concern   Not on file  Social History Narrative   Retired, married. Walks everyday         Social Determinants of Health   Financial Resource Strain:    Difficulty of Paying Living Expenses: Not on file  Food Insecurity:    Worried About Charity fundraiser in the Last Year: Not on file   [employer] of Food in the Last Year: Not on file  Transportation Needs:    Lack of Transportation (Medical): Not on file   Lack of Transportation (Non-Medical): Not on file  Physical Activity:    Days of Exercise per Week: Not on file   Minutes of Exercise per Session: Not on file  Stress:    Feeling of Stress : Not on file  Social Connections:    Frequency of Communication with Friends and Family: Not on file   Frequency of Social Gatherings with Friends and Family: Not on file   Attends Religious Services: Not on file   Active Member of Clubs or Organizations: Not  on file   Attends Club or Organization Meetings: Not on file   Marital Status: Not on file    Outpatient Encounter Medications as of 08/19/2020  Medication Sig   Accu-Chek Softclix Lancets lancets Use as instructed   amLODipine (NORVASC) 5 MG tablet TAKE 1 TABLET(5 MG) BY MOUTH DAILY   aspirin (ASPIR-LOW) 81 MG EC tablet Take 81 mg by mouth daily.     calcium-vitamin D (OSCAL 500/200 D-3) 500-200 MG-UNIT per tablet Take 1 tablet by mouth 2 (two) times daily.    docusate sodium (COLACE) 100 MG capsule Take by mouth.   ferrous sulfate 325 (65 FE) MG EC tablet Take 325 mg by mouth 3 (three) times daily with meals.   finasteride (PROSCAR) 5 MG tablet TAKE 1 TABLET(5 MG) BY MOUTH DAILY   furosemide (LASIX) 20 MG tablet Take 1 tablet (20 mg total) by mouth daily.   glucose blood (ACCU-CHEK COMPACT PLUS) test strip    lovastatin (MEVACOR) 20 MG tablet Take 1 tablet (20 mg total) by mouth at bedtime.   metFORMIN (GLUCOPHAGE-XR) 500 MG 24 hr tablet TAKE 1 TABLET BY MOUTH EVERY DAY WITH BREAKFAST   mycophenolate (CELLCEPT) 500 MG tablet Take by mouth.   olmesartan (BENICAR) 40 MG tablet Take 1 tablet (40 mg total) by mouth daily.   tamsulosin (FLOMAX) 0.4 MG CAPS capsule TAKE 1 CAPSULE(0.4 MG) BY MOUTH DAILY AFTER SUPPER   UNABLE TO FIND Use once daily to check blood sugars Dx 250.00   [DISCONTINUED] mycophenolate (CELLCEPT) 500 MG tablet Take 1,000 mg by mouth 2 (two) times daily.   No facility-administered encounter medications on file as of 08/19/2020.    Review of Systems  Constitutional: Negative for appetite change and unexpected weight change.  HENT: Negative for congestion and sinus pressure.   Respiratory: Negative for cough and chest tightness.        Breathing better.    Cardiovascular: Negative for chest pain, palpitations and leg swelling.  Gastrointestinal: Negative for abdominal pain, diarrhea, nausea and vomiting.  Genitourinary: Negative for difficulty  urinating and dysuria.  Musculoskeletal: Negative for joint swelling and myalgias.  Skin: Negative for color change and rash.  Neurological: Negative for dizziness, light-headedness and headaches.  Psychiatric/Behavioral: Negative for agitation and dysphoric mood.       Objective:    Physical Exam Vitals reviewed.  Constitutional:      General: He is not in acute distress.    Appearance: Normal appearance. He is well-developed.  HENT:     Head: Normocephalic and atraumatic.     Right Ear: External ear normal.     Left Ear: External ear normal.  Eyes:     General: No scleral icterus.  Right eye: No discharge.        Left eye: No discharge.     Conjunctiva/sclera: Conjunctivae normal.  Cardiovascular:     Rate and Rhythm: Normal rate and regular rhythm.  Pulmonary:     Effort: Pulmonary effort is normal. No respiratory distress.     Breath sounds: Normal breath sounds.  Abdominal:     General: Bowel sounds are normal.     Palpations: Abdomen is soft.     Tenderness: There is no abdominal tenderness.  Musculoskeletal:        General: No swelling or tenderness.     Cervical back: Neck supple. No tenderness.  Lymphadenopathy:     Cervical: No cervical adenopathy.  Skin:    Findings: No erythema or rash.  Neurological:     Mental Status: He is alert.  Psychiatric:        Mood and Affect: Mood normal.        Behavior: Behavior normal.     BP 128/62    Pulse 83    Temp 97.7 F (36.5 C) (Oral)    Ht _0  (1.803 m)    Wt 217 lb (98.4 kg)    SpO2 95%    BMI 30.27 kg/m  Wt Readings from Last 3 Encounters:  08/19/20 217 lb (98.4 kg)  08/01/20 227 lb 3.2 oz (103.1 kg)  01/25/20 223 lb 3.2 oz (101.2 kg)     Lab Results  Component Value Date   WBC 8.7 08/04/2020   HGB 11.0 (L) 08/04/2020   HCT 33.1 (L) 08/04/2020   PLT 454.0 (H) 08/04/2020   GLUCOSE 144 (H) 08/11/2020   CHOL 142 08/11/2020   TRIG 131.0 08/11/2020   HDL 38.30 (L) 08/11/2020   LDLCALC 78  08/11/2020   ALT 53 08/01/2020   AST 36 08/01/2020   NA 132 (L) 08/11/2020   K 4.9 08/11/2020   CL 94 (L) 08/11/2020   CREATININE 0.65 08/11/2020   BUN 10 08/11/2020   CO2 27 08/11/2020   TSH 2.31 08/01/2020   PSA 0.02 (L) 04/16/2018   HGBA1C 7.8 (H) 08/01/2020   MICROALBUR 7.6 (H) 07/23/2019    MR LUMBAR SPINE WO CONTRAST  Addendum Date: 05/01/2018   ADDENDUM REPORT: 05/01/2018 17:12 ADDENDUM: Chronic T12, L2, L3, and L5 compression fractures. Electronically Signed   By: Logan Bores M.D.   On: 05/01/2018 17:12   Result Date: 05/01/2018 CLINICAL DATA:  Lumbar radiculopathy. Low back and left leg pain for couple of months. EXAM: MRI LUMBAR SPINE WITHOUT CONTRAST TECHNIQUE: Multiplanar, multisequence MR imaging of the lumbar spine was performed. No intravenous contrast was administered. COMPARISON:  None. FINDINGS: Segmentation:  Standard. Alignment: Mild lumbar levoscoliosis. 3 mm anterolisthesis of L3 on L4. Vertebrae: T12, L2, L3, and L5 compression fractures with mild-to-moderate vertebral body height loss and no significant marrow edema. 3 mm retropulsion of the posterosuperior T12 vertebral body. Mild periarticular marrow and soft tissue edema associated with left L5-S1 facet arthritis. Conus medullaris and cauda equina: Conus extends to the lower L1 level. Conus and cauda equina appear normal. Paraspinal and other soft tissues: Mild left lower lumbar posterior paraspinal muscle, possibly strain. Disc levels: Disc desiccation throughout the lower thoracic and lumbar spine. Mild disc space narrowing at L2-3 and L4-5. T11-12: Mild T12 superior endplate retropulsion, mild disc bulging, and mild facet arthrosis result in borderline to mild spinal stenosis without neural foraminal stenosis. T12-L1: Mild disc bulging and moderate facet hypertrophy without stenosis. L1-2: Mild circumferential disc  bulging and moderate facet hypertrophy result in minimal right neural foraminal narrowing without  spinal stenosis. L2-3: Circumferential disc bulging, a broad right subarticular to foraminal disc protrusion, and moderate facet hypertrophy result in minimal right lateral recess narrowing without spinal or neural foraminal stenosis. L3-4: Anterolisthesis with slight bulging of uncovered disc, mildly prominent dorsal epidural fat, and severe facet hypertrophy result in mild-to-moderate spinal stenosis, mild bilateral lateral recess stenosis, and mild bilateral neural foraminal stenosis. There is likely facet ankylosis bilaterally. L4-5: Disc bulging, mild ligamentum flavum hypertrophy, and moderate left greater than right facet hypertrophy result in mild spinal stenosis, mild-to-moderate left greater than right lateral recess stenosis, and minimal bilateral neural foraminal stenosis. L5-S1: Disc bulging and severe left greater than right facet arthrosis result in mild left lateral recess stenosis and mild right and severe left neural foraminal stenosis with potential left L5 nerve root impingement. There is a left facet joint effusion, and a 6 x 3 mm synovial cyst is present anterior to the left facet joint in close proximity to the exiting left L5 nerve at the lateral aspect of the foramen though without frank nerve compression. IMPRESSION: 1. Severe left L5-S1 facet arthritis with associated joint effusion and edema. Severe left neural foraminal stenosis with potential left L5 nerve root impingement. 2. Mild-to-moderate spinal and lateral recess stenosis at L3-4 and L4-5. Electronically Signed: By: Logan Bores M.D. On: 05/01/2018 13:20       Assessment & Plan:   Problem List Items Addressed This Visit    SOB (shortness of breath)    Recently evaluated for increased sob.  Took a few days of lasix after last visit.  Helped.  Weight is down.  Breathing better.  Follow.  Hold on further lasix at this time.        Myasthenia gravis ([place])    On cellcept.  Stable.  Follow.        Hyponatremia    Off  hctz.  Recent decrease.  Limiting free water intake.  Last sodium check improved.  Follow sodium level.       Relevant Orders   Basic metabolic panel   HYPERTENSION, BENIGN    Blood pressure on recheck improved.  Continue amlodipine and benicar.  Follow pressures.  Follow metabolic panel.       Relevant Orders   Basic metabolic panel   Hypercholesterolemia    On lovastatin.  Follow lipid panel and liver function tests.        Diabetes mellitus ([place])    Sugars doing better recently.  Follow met b and a1c.  Appetite improving.        Anemia    Follow cbc. Recent decrease in hgb.  Iron low.  Started on iron.  Tolerating.  Check cbc and iron studies next labs.        Relevant Medications   ferrous sulfate 325 (65 FE) MG EC tablet   Other Relevant Orders   CBC with Differential/Platelet   IBC + Ferritin    Other Visit Diagnoses    Need for immunization against influenza    -  Primary   Relevant Orders   Flu Vaccine QUAD High Dose(Fluad) (Completed)       Einar Pheasant, MD

## 2020-08-20 ENCOUNTER — Encounter: Payer: Self-pay | Admitting: Internal Medicine

## 2020-08-20 NOTE — Assessment & Plan Note (Signed)
Off hctz.  Recent decrease.  Limiting free water intake.  Last sodium check improved.  Follow sodium level.

## 2020-08-20 NOTE — Assessment & Plan Note (Signed)
On cellcept.  Stable.  Follow.

## 2020-08-20 NOTE — Assessment & Plan Note (Signed)
Sugars doing better recently.  Follow met b and a1c.  Appetite improving.

## 2020-08-20 NOTE — Assessment & Plan Note (Signed)
Blood pressure on recheck improved.  Continue amlodipine and benicar.  Follow pressures.  Follow metabolic panel.

## 2020-08-20 NOTE — Assessment & Plan Note (Signed)
Recently evaluated for increased sob.  Took a few days of lasix after last visit.  Helped.  Weight is down.  Breathing better.  Follow.  Hold on further lasix at this time.

## 2020-08-20 NOTE — Assessment & Plan Note (Addendum)
Follow cbc. Recent decrease in hgb.  Iron low.  Started on iron.  Tolerating.  Check cbc and iron studies next labs.

## 2020-08-20 NOTE — Assessment & Plan Note (Signed)
On lovastatin.  Follow lipid panel and liver function tests.

## 2020-08-22 ENCOUNTER — Other Ambulatory Visit: Payer: Self-pay

## 2020-08-22 ENCOUNTER — Ambulatory Visit (INDEPENDENT_AMBULATORY_CARE_PROVIDER_SITE_OTHER): Payer: Medicare Other

## 2020-08-22 DIAGNOSIS — R0602 Shortness of breath: Secondary | ICD-10-CM

## 2020-08-24 LAB — ECHOCARDIOGRAM COMPLETE
AR max vel: 2.96 cm2
AV Area VTI: 3 cm2
AV Area mean vel: 2.85 cm2
AV Mean grad: 4 mmHg
AV Peak grad: 8.3 mmHg
Ao pk vel: 1.44 m/s
Area-P 1/2: 2.48 cm2
Calc EF: 63.6 %
S' Lateral: 3.2 cm
Single Plane A2C EF: 63.3 %
Single Plane A4C EF: 64.4 %

## 2020-08-30 NOTE — Progress Notes (Signed)
Cardiology Office Note  Date:  08/31/2020   ID:  [name], DOB [birth year], MRN [id number]  PCP:  Einar Pheasant, MD   Chief Complaint  Patient presents with  . Follow-up    12 months  Pt states no new Sx.    HPI:  [name] is a 86 yo male with a h/o  Myasthenia Gravis, severe muscle weakness, arms, occular, facial HTN,  diabetes, hemoglobin A1c 8.9 obesity ,  hyperlipidemia,  pernicious anemia, palpitations    hyponatremia Presenting for routine followup of his hypertension, diabetes  Last seen 07/28/2019 Fall with hip fracture, Had surgery ,   Presents today with his daughter She reports that he had recent symptoms of shortness of breath, weight gain Was seen in the clinic, given  lasix 3 pills Had significant urination, felt better, dropped "10 pounds" Reports weight is relatively stable Does not take Lasix on a regular basis  Recent symptoms of Profound fatigue And lab work through primary care, sodium down to 127 Started taking B12, some increased salt Repeat lab work sodium improving  Echo: 08/22/2020 results reviewed with him in detail EF 60%, RVSP 39  Weight 214 at home, feels this is stable  Iron constipating Taking senna Through away his Colace  Other lab work reviewed HBA1c 7.8  Other PMH reviewed diagnosis of Myasthenia Gravis severe muscle weakness, arms, occular, facial had 5 treatments over 2 weeks, QOD On cellcept since Jan 2018 On today's visit reports that when he wakes up 1 of his eyelids is slow to get out of the way Through the course of the morning usually resolves and goes back to normal  Wife died 2017/11/23, severe Parkinson's  PMH:   has a past medical history of Allergic rhinitis, Basal cell carcinoma (01/29/2007), BPH (benign prostatic hypertrophy), Degenerative arthritis, Diabetes mellitus ([place]), GERD (gastroesophageal reflux disease), HTN (hypertension), Hypercholesterolemia, HYPERTENSION, BENIGN (08/15/2010),  Iron deficiency anemia, Skin cancer (06/23/2014), Squamous cell carcinoma of skin (04/01/2007), Squamous cell carcinoma of skin (05/16/2016), TIA (transient ischemic attack) (09/13/2015), and Urinary outflow obstruction.  PSH:    Past Surgical History:  Procedure Laterality Date  . PARTIAL HIP ARTHROPLASTY  01/2019  . PILONIDAL CYST EXCISION  1951   removal  . [place]  . SKIN CANCER EXCISION     multiple  . SQUAMOUS CELL CARCINOMA EXCISION     behind head  . TONSILLECTOMY AND ADENOIDECTOMY  1938    Current Outpatient Medications  Medication Sig Dispense Refill  . Accu-Chek Softclix Lancets lancets Use as instructed    . amLODipine (NORVASC) 5 MG tablet TAKE 1 TABLET(5 MG) BY MOUTH DAILY 30 tablet 3  . aspirin (ASPIR-LOW) 81 MG EC tablet Take 81 mg by mouth daily.      . calcium-vitamin D (OSCAL 500/200 D-3) 500-200 MG-UNIT per tablet Take 1 tablet by mouth 2 (two) times daily.     . ferrous sulfate 325 (65 FE) MG EC tablet Take 325 mg by mouth 3 (three) times daily with meals.    . finasteride (PROSCAR) 5 MG tablet TAKE 1 TABLET(5 MG) BY MOUTH DAILY 90 tablet 3  . furosemide (LASIX) 20 MG tablet Take 1 tablet (20 mg total) by mouth daily. 10 tablet 0  . glucose blood (ACCU-CHEK COMPACT PLUS) test strip     . lovastatin (MEVACOR) 20 MG tablet Take 1 tablet (20 mg total) by mouth at bedtime. 90 tablet 1  . metFORMIN (GLUCOPHAGE-XR) 500 MG 24 hr tablet TAKE 1 TABLET  BY MOUTH EVERY DAY WITH BREAKFAST 90 tablet 0  . mycophenolate (CELLCEPT) 500 MG tablet Take by mouth.    . olmesartan (BENICAR) 40 MG tablet Take 1 tablet (40 mg total) by mouth daily. 30 tablet 2  . tamsulosin (FLOMAX) 0.4 MG CAPS capsule TAKE 1 CAPSULE(0.4 MG) BY MOUTH DAILY AFTER SUPPER 90 capsule 2  . UNABLE TO FIND Use once daily to check blood sugars Dx 250.00     No current facility-administered medications for this visit.    Allergies:   Magnesium and Penicillamine   Social History:  The  patient  reports that he has never smoked. He has never used smokeless tobacco. He reports that he does not drink alcohol and does not use drugs.   Family History:   family history includes Alzheimer's disease in his mother; CVA in his brother; Heart disease in his father.   Review of Systems: Review of Systems  Constitutional: Negative.   Respiratory: Negative.   Cardiovascular: Positive for leg swelling.  Gastrointestinal: Negative.   Musculoskeletal: Negative.   Neurological: Negative.   Psychiatric/Behavioral: Negative.   All other systems reviewed and are negative.   PHYSICAL EXAM: VS:  BP 124/68   Pulse 83   Ht 5\' 11"  (1.803 m)   Wt 219 lb (99.3 kg)   BMI 30.54 kg/m  , BMI Body mass index is 30.54 kg/m. Constitutional:  oriented to person, place, and time. No distress.  HENT:  Head: Grossly normal Eyes:  no discharge. No scleral icterus.  Neck: No JVD, no carotid bruits  Cardiovascular: Regular rate and rhythm, no murmurs appreciated Pulmonary/Chest: Clear to auscultation bilaterally, no wheezes or rails Abdominal: Soft.  no distension.  no tenderness.  Musculoskeletal: Normal range of motion Neurological:  normal muscle tone. Coordination normal. No atrophy Skin: Skin warm and dry Psychiatric: normal affect, pleasant   Recent Labs: 08/01/2020: ALT 53; TSH 2.31 08/04/2020: Hemoglobin 11.0; Platelets 454.0 08/11/2020: BUN 10; Creatinine, Ser 0.65; Potassium 4.9; Sodium 132    Lipid Panel Lab Results  Component Value Date   CHOL 142 08/11/2020   HDL 38.30 (L) 08/11/2020   LDLCALC 78 08/11/2020   TRIG 131.0 08/11/2020    Wt Readings from Last 3 Encounters:  08/31/20 219 lb (99.3 kg)  08/19/20 217 lb (98.4 kg)  08/01/20 227 lb 3.2 oz (103.1 kg)     ASSESSMENT AND PLAN:  Hypercholesterolemia Cholesterol is at goal on the current lipid regimen. No changes to the medications were made.  HYPERTENSION, BENIGN Blood pressure is well controlled on today's  visit. No changes made to the medications.   Type 2 diabetes mellitus without complication, without long-term current use of insulin (HCC) Weight stable 214, A1c trending upwards Lifestyle modification and dietary changes recommended  Obesity (BMI 30.0-34.9 Weight stable as above No regular exercise program  Myasthenia gravis (HCC) On CellCept No exercise, denies any progression  Hyponatremia Recent drop, he has liberalized his salt intake  Chronic diastolic CHF Recently required several doses of Lasix for shortness of breath symptoms and weight gain Recommend he watch his weight several times per week He does have extra Lasix as needed  Leg swelling  dependent edema, would benefit from compression hose Leg elevation   Total encounter time more than 25 minutes  Greater than 50% was spent in counseling and coordination of care with the patient    Orders Placed This Encounter  Procedures  . EKG 12-Lead     Signed, Esmond Plants, M.D., Ph.D. 08/31/2020  [place], [place]

## 2020-08-31 ENCOUNTER — Other Ambulatory Visit: Payer: Self-pay

## 2020-08-31 ENCOUNTER — Encounter: Payer: Self-pay | Admitting: Cardiovascular Disease

## 2020-08-31 ENCOUNTER — Ambulatory Visit (INDEPENDENT_AMBULATORY_CARE_PROVIDER_SITE_OTHER): Payer: Medicare Other | Admitting: Cardiovascular Disease

## 2020-08-31 VITALS — BP 124/68 | HR 83 | Ht 71.0 in | Wt 219.0 lb

## 2020-08-31 DIAGNOSIS — G7000 Myasthenia gravis without (acute) exacerbation: Secondary | ICD-10-CM | POA: Diagnosis not present

## 2020-08-31 DIAGNOSIS — I10 Essential (primary) hypertension: Secondary | ICD-10-CM | POA: Diagnosis not present

## 2020-08-31 DIAGNOSIS — R002 Palpitations: Secondary | ICD-10-CM | POA: Diagnosis not present

## 2020-08-31 DIAGNOSIS — G459 Transient cerebral ischemic attack, unspecified: Secondary | ICD-10-CM

## 2020-08-31 DIAGNOSIS — E7800 Pure hypercholesterolemia, unspecified: Secondary | ICD-10-CM

## 2020-08-31 DIAGNOSIS — E1165 Type 2 diabetes mellitus with hyperglycemia: Secondary | ICD-10-CM

## 2020-08-31 DIAGNOSIS — R0602 Shortness of breath: Secondary | ICD-10-CM

## 2020-08-31 NOTE — Patient Instructions (Signed)
Medication Instructions:  No changes  If you need a refill on your cardiac medications before your next appointment, please call your pharmacy.    Lab work: No new labs needed   If you have labs (blood work) drawn today and your tests are completely normal, you will receive your results only by: . MyChart Message (if you have MyChart) OR . A paper copy in the mail If you have any lab test that is abnormal or we need to change your treatment, we will call you to review the results.   Testing/Procedures: No new testing needed   Follow-Up: At [facility], you and your health needs are our priority.  As part of our continuing mission to provide you with exceptional heart care, we have created designated Provider Care Teams.  These Care Teams include your primary Cardiologist (physician) and Advanced Practice Providers (APPs -  Physician Assistants and Nurse Practitioners) who all work together to provide you with the care you need, when you need it.  . You will need a follow up appointment in 12 months  . Providers on your designated Care Team:   . Christopher Berge, NP . Ryan Dunn, PA-C . Jacquelyn Visser, PA-C  Any Other Special Instructions Will Be Listed Below (If Applicable).  COVID-19 Vaccine Information can be found at: [email or web address] For questions related to vaccine distribution or appointments, please email [email or web address] or call [phone].     

## 2020-09-01 ENCOUNTER — Other Ambulatory Visit: Payer: Self-pay

## 2020-09-01 ENCOUNTER — Ambulatory Visit (INDEPENDENT_AMBULATORY_CARE_PROVIDER_SITE_OTHER): Payer: Medicare Other

## 2020-09-01 DIAGNOSIS — E538 Deficiency of other specified B group vitamins: Secondary | ICD-10-CM

## 2020-09-01 MED ORDER — CYANOCOBALAMIN 1000 MCG/ML IJ SOLN
1000.0000 ug | Freq: Once | INTRAMUSCULAR | Status: AC
  Administered 2020-09-01: 1000 ug via INTRAMUSCULAR

## 2020-09-01 NOTE — Progress Notes (Addendum)
Patient presented for B 12 injection to right deltoid, patient voiced no concerns nor showed any signs of distress during injection.  Reviewed.  Dr Danley Danker

## 2020-09-02 ENCOUNTER — Telehealth: Payer: Self-pay | Admitting: Internal Medicine

## 2020-09-02 NOTE — Telephone Encounter (Signed)
Received request from [place] through [facility] for patient diabetic supplies.  Faxed office notes and last A1C

## 2020-09-15 ENCOUNTER — Other Ambulatory Visit: Payer: Self-pay

## 2020-09-15 ENCOUNTER — Ambulatory Visit (INDEPENDENT_AMBULATORY_CARE_PROVIDER_SITE_OTHER): Payer: Medicare Other | Admitting: Podiatry

## 2020-09-15 ENCOUNTER — Encounter: Payer: Self-pay | Admitting: Podiatry

## 2020-09-15 DIAGNOSIS — E1142 Type 2 diabetes mellitus with diabetic polyneuropathy: Secondary | ICD-10-CM | POA: Diagnosis not present

## 2020-09-15 DIAGNOSIS — M79674 Pain in right toe(s): Secondary | ICD-10-CM

## 2020-09-15 DIAGNOSIS — B351 Tinea unguium: Secondary | ICD-10-CM | POA: Diagnosis not present

## 2020-09-15 DIAGNOSIS — M79675 Pain in left toe(s): Secondary | ICD-10-CM | POA: Diagnosis not present

## 2020-09-15 NOTE — Progress Notes (Signed)
This patient returns to my office for at risk foot care.  This patient requires this care by a professional since this patient will be at risk due to having type 2 diabetes.    This patient is unable to cut nails himself since the patient cannot reach his nails.These nails are painful walking and wearing shoes.   This patient presents for at risk foot care today.  General Appearance  Alert, conversant and in no acute stress.  Vascular  Dorsalis pedis and posterior tibial  pulses are weakly  palpable  bilaterally.  Capillary return is within normal limits  bilaterally. Temperature is within normal limits  bilaterally.  Neurologic  Senn-Weinstein monofilament wire test within normal limits  bilaterally. Muscle power within normal limits bilaterally.  Nails Thick disfigured discolored nails with subungual debris  from hallux to fifth toes bilaterally. No evidence of bacterial infection or drainage bilaterally.  Orthopedic  No limitations of motion  feet .  No crepitus or effusions noted.  No bony pathology or digital deformities noted.  Skin  normotropic skin with no porokeratosis noted bilaterally.  No signs of infections or ulcers noted.     Onychomycosis  Pain in right toes  Pain in left toes  Consent was obtained for treatment procedures.   Mechanical debridement of nails 1-5  bilaterally performed with a nail nipper.  Filed with dremel without incident. .   .     Return office visit   3 months                  Told patient to return for periodic foot care and evaluation due to potential at risk complications.   Gardiner Barefoot DPM

## 2020-09-16 ENCOUNTER — Telehealth: Payer: Self-pay | Admitting: Internal Medicine

## 2020-09-16 NOTE — Telephone Encounter (Signed)
Patient stated that the iron was causing him to be constipated. He stopped it for 3 days and daughter in law brought him MOM. Was effective. Bowels are getting back to normal. He is going to start iron supplement qod and use miralax to keep bowels moving. Patient is going to call with update beginning of week.

## 2020-09-16 NOTE — Telephone Encounter (Signed)
Pt called and wanted to discuss and let Dr. Nicki Reaper know that he has stopped taking the iron supplement  It is making him constipated

## 2020-09-19 ENCOUNTER — Ambulatory Visit: Payer: Medicare Other | Admitting: Urology

## 2020-09-22 ENCOUNTER — Ambulatory Visit (INDEPENDENT_AMBULATORY_CARE_PROVIDER_SITE_OTHER): Payer: Medicare Other | Admitting: Urology

## 2020-09-22 ENCOUNTER — Other Ambulatory Visit: Payer: Self-pay

## 2020-09-22 ENCOUNTER — Encounter: Payer: Self-pay | Admitting: Urology

## 2020-09-22 VITALS — BP 180/76 | HR 82 | Ht 71.0 in | Wt 219.0 lb

## 2020-09-22 DIAGNOSIS — N401 Enlarged prostate with lower urinary tract symptoms: Secondary | ICD-10-CM

## 2020-09-22 DIAGNOSIS — N390 Urinary tract infection, site not specified: Secondary | ICD-10-CM | POA: Diagnosis not present

## 2020-09-22 LAB — BLADDER SCAN AMB NON-IMAGING

## 2020-09-22 MED ORDER — TAMSULOSIN HCL 0.4 MG PO CAPS: ORAL_CAPSULE | ORAL | 3 refills | Status: DC

## 2020-09-22 MED ORDER — FINASTERIDE 5 MG PO TABS: ORAL_TABLET | ORAL | 3 refills | Status: DC

## 2020-09-22 NOTE — Progress Notes (Signed)
   09/22/2020 3:00 PM   EMERICK WEATHERLY 02/23/1926 [id number]  Reason for visit: Follow up BPH, history of UTI  HPI: I saw Mr. Radle in urology clinic for the above issues.  He is a 86 year old male with a long history of urinary symptoms moderately controlled on Flomax and finasteride.  He also had a culture proven UTI 11 months ago.  He reports his symptoms have worsened slightly over the last year with weakening stream, straining, nocturia, and dribbling weak stream overnight.  IPSS score today as 21, with quality of life terrible.  PVR is normal at 34 mL.  He recently had a negative urine culture with his PCP.  We discussed options and alternatives at length including further evaluation with cystoscopy and TRUS and consideration of an outlet procedure, likely UroLift with his age.  We discussed the risks and benefits at length.  He would like to hold off at this time with any other treatments, and continue Flomax and finasteride.  We discussed return precautions at length including urinary retention, UTIs, gross hematuria, or worsening urinary symptoms.  Flomax and finasteride refilled RTC 1 year IPSS, PVR  [facility], [place] [address], [place], [place] [address] [phone]

## 2020-09-22 NOTE — Patient Instructions (Signed)

## 2020-09-24 ENCOUNTER — Other Ambulatory Visit: Payer: Self-pay | Admitting: Internal Medicine

## 2020-09-24 DIAGNOSIS — I10 Essential (primary) hypertension: Secondary | ICD-10-CM

## 2020-09-26 DIAGNOSIS — G7000 Myasthenia gravis without (acute) exacerbation: Secondary | ICD-10-CM | POA: Diagnosis not present

## 2020-10-04 ENCOUNTER — Other Ambulatory Visit: Payer: Self-pay

## 2020-10-04 ENCOUNTER — Ambulatory Visit (INDEPENDENT_AMBULATORY_CARE_PROVIDER_SITE_OTHER): Payer: Medicare Other | Admitting: Dermatology

## 2020-10-04 ENCOUNTER — Encounter: Payer: Self-pay | Admitting: Dermatology

## 2020-10-04 ENCOUNTER — Ambulatory Visit (INDEPENDENT_AMBULATORY_CARE_PROVIDER_SITE_OTHER): Payer: Medicare Other

## 2020-10-04 DIAGNOSIS — L739 Follicular disorder, unspecified: Secondary | ICD-10-CM

## 2020-10-04 DIAGNOSIS — L570 Actinic keratosis: Secondary | ICD-10-CM | POA: Diagnosis not present

## 2020-10-04 DIAGNOSIS — L820 Inflamed seborrheic keratosis: Secondary | ICD-10-CM

## 2020-10-04 DIAGNOSIS — E538 Deficiency of other specified B group vitamins: Secondary | ICD-10-CM

## 2020-10-04 DIAGNOSIS — L821 Other seborrheic keratosis: Secondary | ICD-10-CM

## 2020-10-04 DIAGNOSIS — L578 Other skin changes due to chronic exposure to nonionizing radiation: Secondary | ICD-10-CM | POA: Diagnosis not present

## 2020-10-04 DIAGNOSIS — Z85828 Personal history of other malignant neoplasm of skin: Secondary | ICD-10-CM

## 2020-10-04 MED ORDER — CYANOCOBALAMIN 1000 MCG/ML IJ SOLN
1000.0000 ug | Freq: Once | INTRAMUSCULAR | Status: AC
  Administered 2020-10-04: 1000 ug via INTRAMUSCULAR

## 2020-10-04 MED ORDER — CLINDAMYCIN PHOSPHATE 1 % EX LOTN: TOPICAL_LOTION | Freq: Two times a day (BID) | CUTANEOUS | 3 refills | Status: DC

## 2020-10-04 NOTE — Progress Notes (Signed)
   Follow-Up Visit   Subjective  [name] is a 86 y.o. male who presents for the following: Actinic Keratosis (L forehead, 46m f/u, LN2 on 06/29/20), ISK (L brow, 1m f/u LN2 on 06/29/20), BCC bx proven and treated with EDC (L crown scalp, recheck today), pimple? (crown scalp, just noticed), and bumps (chin).  The following portions of the chart were reviewed this encounter and updated as appropriate:  Tobacco  Allergies  Meds  Problems  Med Hx  Surg Hx  Fam Hx     Review of Systems:  No other skin or systemic complaints except as noted in HPI or Assessment and Plan.  Objective  Well appearing patient in no apparent distress; mood and affect are within normal limits.  A focused examination was performed including face, scalp. Relevant physical exam findings are noted in the Assessment and Plan.  Objective  L crown scalp: Well healed scar with no evidence of recurrence.   Objective  chin: Red paps chin  Objective  Scalp/face/L ear x 12 (12): Pink scaly macules   Objective  Scalp x 1: Erythematous keratotic or waxy stuck-on papule or plaque.    Assessment & Plan    Seborrheic Keratoses - Stuck-on, waxy, tan-brown papules and plaques  - Discussed benign etiology and prognosis. - Observe - Call for any changes  Actinic Damage - chronic, secondary to cumulative UV radiation exposure/sun exposure over time - diffuse scaly erythematous macules with underlying dyspigmentation - Recommend daily broad spectrum sunscreen SPF 30+ to sun-exposed areas, reapply every 2 hours as needed.  - Call for new or changing lesions.  History of basal cell carcinoma (BCC) L crown scalp  Clear. Observe for recurrence. Call clinic for new or changing lesions.  Recommend regular skin exams, daily broad-spectrum spf 30+ sunscreen use, and photoprotection.     Folliculitis chin  /Acne associated with mask use  Start Clindamycin lotion bid aa face  clindamycin (CLEOCIN-T)  1 % lotion - chin  AK (actinic keratosis) (12) Scalp/face/L ear x 12  Destruction of lesion - Scalp/face/L ear x 12 Complexity: simple   Destruction method: cryotherapy   Informed consent: discussed and consent obtained   Timeout:  patient name, date of birth, surgical site, and procedure verified Lesion destroyed using liquid nitrogen: Yes   Region frozen until ice ball extended beyond lesion: Yes   Outcome: patient tolerated procedure well with no complications   Post-procedure details: wound care instructions given    Inflamed seborrheic keratosis Scalp x 1  Destruction of lesion - Scalp x 1 Complexity: simple   Destruction method: cryotherapy   Informed consent: discussed and consent obtained   Timeout:  patient name, date of birth, surgical site, and procedure verified Lesion destroyed using liquid nitrogen: Yes   Region frozen until ice ball extended beyond lesion: Yes   Outcome: patient tolerated procedure well with no complications   Post-procedure details: wound care instructions given    Return in about 4 months (around 02/01/2021) for AK, ISK, Folliculitis f/u.  I, Othelia Pulling, RMA, am acting as scribe for Sarina Ser, MD   Documentation: I have reviewed the above documentation for accuracy and completeness, and I agree with the above.  Sarina Ser, MD

## 2020-10-04 NOTE — Progress Notes (Addendum)
Patient presented for B 12 injection to left deltoid, patient voiced no concerns nor showed any signs of distress during injection.  Reviewed.  Dr Scott 

## 2020-10-06 ENCOUNTER — Encounter: Payer: Self-pay | Admitting: Dermatology

## 2020-10-09 ENCOUNTER — Other Ambulatory Visit: Payer: Self-pay | Admitting: Internal Medicine

## 2020-10-09 DIAGNOSIS — E119 Type 2 diabetes mellitus without complications: Secondary | ICD-10-CM

## 2020-10-10 DIAGNOSIS — E119 Type 2 diabetes mellitus without complications: Secondary | ICD-10-CM | POA: Diagnosis not present

## 2020-10-10 LAB — HM DIABETES EYE EXAM

## 2020-10-12 ENCOUNTER — Other Ambulatory Visit (INDEPENDENT_AMBULATORY_CARE_PROVIDER_SITE_OTHER): Payer: Medicare Other

## 2020-10-12 ENCOUNTER — Other Ambulatory Visit: Payer: Self-pay

## 2020-10-12 DIAGNOSIS — E7800 Pure hypercholesterolemia, unspecified: Secondary | ICD-10-CM

## 2020-10-12 DIAGNOSIS — E871 Hypo-osmolality and hyponatremia: Secondary | ICD-10-CM | POA: Diagnosis not present

## 2020-10-12 DIAGNOSIS — D649 Anemia, unspecified: Secondary | ICD-10-CM | POA: Diagnosis not present

## 2020-10-12 DIAGNOSIS — I10 Essential (primary) hypertension: Secondary | ICD-10-CM | POA: Diagnosis not present

## 2020-10-12 LAB — CBC WITH DIFFERENTIAL/PLATELET
Basophils Absolute: 0.1 10*3/uL (ref 0.0–0.1)
Basophils Relative: 0.8 % (ref 0.0–3.0)
Eosinophils Absolute: 0.1 10*3/uL (ref 0.0–0.7)
Eosinophils Relative: 1.4 % (ref 0.0–5.0)
HCT: 38.3 % — ABNORMAL LOW (ref 39.0–52.0)
Hemoglobin: 12.6 g/dL — ABNORMAL LOW (ref 13.0–17.0)
Lymphocytes Relative: 18.5 % (ref 12.0–46.0)
Lymphs Abs: 1.3 10*3/uL (ref 0.7–4.0)
MCHC: 33 g/dL (ref 30.0–36.0)
MCV: 90.6 fl (ref 78.0–100.0)
Monocytes Absolute: 0.5 10*3/uL (ref 0.1–1.0)
Monocytes Relative: 7.1 % (ref 3.0–12.0)
Neutro Abs: 4.9 10*3/uL (ref 1.4–7.7)
Neutrophils Relative %: 72.2 % (ref 43.0–77.0)
Platelets: 327 10*3/uL (ref 150.0–400.0)
RBC: 4.23 Mil/uL (ref 4.22–5.81)
RDW: 15.5 % (ref 11.5–15.5)
WBC: 6.8 10*3/uL (ref 4.0–10.5)

## 2020-10-12 LAB — LIPID PANEL
Cholesterol: 140 mg/dL (ref 0–200)
HDL: 50.2 mg/dL (ref >39.00)
LDL Cholesterol: 59 mg/dL (ref 0–99)
NonHDL: 89.99
Total CHOL/HDL Ratio: 3
Triglycerides: 156 mg/dL — ABNORMAL HIGH (ref 0.0–149.0)
VLDL: 31.2 mg/dL (ref 0.0–40.0)

## 2020-10-12 LAB — IBC + FERRITIN
Ferritin: 87 ng/mL (ref 22.0–322.0)
Iron: 112 ug/dL (ref 42–165)
Saturation Ratios: 37.2 % (ref 20.0–50.0)
Transferrin: 215 mg/dL (ref 212.0–360.0)

## 2020-10-12 LAB — BASIC METABOLIC PANEL
BUN: 11 mg/dL (ref 6–23)
CO2: 28 mEq/L (ref 19–32)
Calcium: 9.4 mg/dL (ref 8.4–10.5)
Chloride: 99 mEq/L (ref 96–112)
Creatinine, Ser: 0.71 mg/dL (ref 0.40–1.50)
GFR: 79.91 mL/min (ref >60.00)
Glucose, Bld: 188 mg/dL — ABNORMAL HIGH (ref 70–99)
Potassium: 4.6 mEq/L (ref 3.5–5.1)
Sodium: 135 mEq/L (ref 135–145)

## 2020-10-14 ENCOUNTER — Ambulatory Visit (INDEPENDENT_AMBULATORY_CARE_PROVIDER_SITE_OTHER): Payer: Medicare Other | Admitting: Internal Medicine

## 2020-10-14 ENCOUNTER — Encounter: Payer: Self-pay | Admitting: Internal Medicine

## 2020-10-14 ENCOUNTER — Other Ambulatory Visit: Payer: Self-pay

## 2020-10-14 DIAGNOSIS — D649 Anemia, unspecified: Secondary | ICD-10-CM | POA: Diagnosis not present

## 2020-10-14 DIAGNOSIS — H6123 Impacted cerumen, bilateral: Secondary | ICD-10-CM | POA: Diagnosis not present

## 2020-10-14 DIAGNOSIS — I10 Essential (primary) hypertension: Secondary | ICD-10-CM | POA: Diagnosis not present

## 2020-10-14 DIAGNOSIS — G7000 Myasthenia gravis without (acute) exacerbation: Secondary | ICD-10-CM | POA: Diagnosis not present

## 2020-10-14 DIAGNOSIS — E1165 Type 2 diabetes mellitus with hyperglycemia: Secondary | ICD-10-CM

## 2020-10-14 DIAGNOSIS — E7800 Pure hypercholesterolemia, unspecified: Secondary | ICD-10-CM | POA: Diagnosis not present

## 2020-10-14 MED ORDER — DEBROX 6.5 % OT SOLN: OTIC | 0 refills | Status: DC

## 2020-10-14 NOTE — Progress Notes (Signed)
Patient ID: [name], male   DOB: [birth year], 86 y.o.   MRN: [id number]   Subjective:    Patient ID: [name], male    DOB: 04-Jun-1926, 86 y.o.   MRN: [id number]  HPI This visit occurred during the SARS-CoV-2 public health emergency.  Safety protocols were in place, including screening questions prior to the visit, additional usage of staff PPE, and extensive cleaning of exam room while observing appropriate contact time as indicated for disinfecting solutions.  Patient here for a scheduled follow up.  Here to follow up regarding his blood sugar, cholesterol and blood pressure.  Sees neurology for follow up of Myasthenia gravis.  Just evaluated.  Felt stable.  Recommended to continue cellcept.  No chest pain or sob reported.  Eating.  No abdominal pain or cramping reported.  Sees Dr Michelene Heady for his eye exams.  Just evaluated.  States everything checked out ok.  Reports AM sugars 140s and PM sugars 120s.  Has noticed some popping in his left ear.  No pain.  Some arthritis - right hip.  Discussed stretches.  Desires no further intervention at this time.     Past Medical History:  Diagnosis Date  . Allergic rhinitis   . Basal cell carcinoma 01/29/2007   Right mid forehead. Excised: 03/24/2007, margins free.  Marland Kitchen BPH (benign prostatic hypertrophy)   . Degenerative arthritis   . Diabetes mellitus ([place])   . GERD (gastroesophageal reflux disease)   . HTN (hypertension)   . Hx of basal cell carcinoma 06/29/2020   L crown scalp, EDC  . Hypercholesterolemia   . HYPERTENSION, BENIGN 08/15/2010   Qualifier: Diagnosis of  By: Rockey Situ MD, Tim    . Iron deficiency anemia   . 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 Excised 07/27/2014, residual focus AFX, margins free.  Marland Kitchen Squamous cell carcinoma of skin 04/01/2007   Right forehead, 2cm above mid brow. WD SCC wtih superficial infiltration. Excised: 05/14/2007, margins free   . Squamous cell carcinoma of skin 05/16/2016   Crown. WD SCC, ulcerated. Excised: 07/03/2016, residual MD SCC, deep margin involved. Excised: 07/17/2016, margins free.  Marland Kitchen TIA (transient ischemic attack) 09/13/2015  . Urinary outflow obstruction    mild   Past Surgical History:  Procedure Laterality Date  . PARTIAL HIP ARTHROPLASTY  01/2019  . PILONIDAL CYST EXCISION  1951   removal  . [place]  . SKIN CANCER EXCISION     multiple  . SQUAMOUS CELL CARCINOMA EXCISION     behind head  . TONSILLECTOMY AND ADENOIDECTOMY  1938   Family History  Problem Relation Age of Onset  . Heart disease Father        heart atack  . Alzheimer's disease Mother   . CVA Brother    Social History   Socioeconomic History  . Marital status: Married    Spouse name: Not on file  . Number of children: Not on file  . Years of education: Not on file  . Highest education level: Not on file  Occupational History  . Not on file  Tobacco Use  . Smoking status: Never Smoker  . Smokeless tobacco: Never Used  Substance and Sexual Activity  . Alcohol use: No    Alcohol/week: 0.0 standard drinks  . Drug use: No  . Sexual activity: Not Currently    Birth control/protection: None  Other Topics Concern  . Not on file  Social History Narrative  Retired, married. Walks everyday         Social Determinants of Health   Financial Resource Strain: Not on file  Food Insecurity: Not on file  Transportation Needs: Not on file  Physical Activity: Not on file  Stress: Not on file  Social Connections: Not on file    Outpatient Encounter Medications as of 10/14/2020  Medication Sig  . Accu-Chek Softclix Lancets lancets Use as instructed  . amLODipine (NORVASC) 5 MG tablet TAKE 1 TABLET(5 MG) BY MOUTH DAILY  . aspirin 81 MG EC tablet Take 81 mg by mouth daily.  . calcium-vitamin D (OSCAL WITH D) 500-200 MG-UNIT tablet Take 1 tablet by mouth 2 (two) times daily.  . clindamycin (CLEOCIN-T) 1  % lotion Apply topically in the morning and at bedtime. Bid to chin  . ferrous sulfate 325 (65 FE) MG EC tablet Take 325 mg by mouth 3 (three) times daily with meals.  . finasteride (PROSCAR) 5 MG tablet TAKE 1 TABLET(5 MG) BY MOUTH DAILY  . furosemide (LASIX) 20 MG tablet Take 1 tablet (20 mg total) by mouth daily.  Marland Kitchen glucose blood (ACCU-CHEK COMPACT PLUS) test strip   . lovastatin (MEVACOR) 20 MG tablet TAKE 1 TABLET(20 MG) BY MOUTH AT BEDTIME  . metFORMIN (GLUCOPHAGE-XR) 500 MG 24 hr tablet TAKE 1 TABLET BY MOUTH EVERY DAY WITH BREAKFAST  . mycophenolate (CELLCEPT) 500 MG tablet Take by mouth.  . olmesartan (BENICAR) 40 MG tablet TAKE 1 TABLET(40 MG) BY MOUTH DAILY  . tamsulosin (FLOMAX) 0.4 MG CAPS capsule TAKE 1 CAPSULE(0.4 MG) BY MOUTH DAILY AFTER SUPPER  . UNABLE TO FIND Use once daily to check blood sugars Dx 250.00  . carbamide peroxide (DEBROX) 6.5 % OTIC solution 4-5 drops in both ears daily.  Massage for approximately 5 minutes.   No facility-administered encounter medications on file as of 10/14/2020.    Review of Systems  Constitutional: Negative for appetite change and unexpected weight change.  HENT: Negative for congestion and sinus pressure.   Respiratory: Negative for cough, chest tightness and shortness of breath.   Cardiovascular: Negative for chest pain, palpitations and leg swelling.  Gastrointestinal: Negative for abdominal pain, diarrhea, nausea and vomiting.  Genitourinary: Negative for difficulty urinating and dysuria.  Musculoskeletal: Negative for joint swelling and myalgias.  Neurological: Negative for dizziness, light-headedness and headaches.  Psychiatric/Behavioral: Negative for agitation and dysphoric mood.       Objective:    Physical Exam Vitals reviewed.  Constitutional:      General: He is not in acute distress.    Appearance: Normal appearance. He is well-developed and well-nourished.  HENT:     Head: Normocephalic and atraumatic.     Right  Ear: External ear normal.     Left Ear: External ear normal.  Eyes:     General: No scleral icterus.       Right eye: No discharge.        Left eye: No discharge.     Conjunctiva/sclera: Conjunctivae normal.  Cardiovascular:     Rate and Rhythm: Normal rate and regular rhythm.  Pulmonary:     Effort: Pulmonary effort is normal. No respiratory distress.     Breath sounds: Normal breath sounds.  Abdominal:     General: Bowel sounds are normal.     Palpations: Abdomen is soft.     Tenderness: There is no abdominal tenderness.  Musculoskeletal:        General: No swelling, tenderness or edema.  Cervical back: Neck supple. No tenderness.  Lymphadenopathy:     Cervical: No cervical adenopathy.  Skin:    Findings: No erythema or rash.  Neurological:     Mental Status: He is alert.  Psychiatric:        Mood and Affect: Mood and affect normal.        Behavior: Behavior normal.     BP 134/68   Pulse 69   Temp (!) 97.4 F (36.3 C)   Ht 5' 10.98" (1.803 m)   Wt 225 lb 3.2 oz (102.2 kg)   SpO2 97%   BMI 31.42 kg/m  Wt Readings from Last 3 Encounters:  10/14/20 225 lb 3.2 oz (102.2 kg)  09/22/20 219 lb (99.3 kg)  08/31/20 219 lb (99.3 kg)     Lab Results  Component Value Date   WBC 6.8 10/12/2020   HGB 12.6 (L) 10/12/2020   HCT 38.3 (L) 10/12/2020   PLT 327.0 10/12/2020   GLUCOSE 188 (H) 10/12/2020   CHOL 140 10/12/2020   TRIG 156.0 (H) 10/12/2020   HDL 50.20 10/12/2020   LDLCALC 59 10/12/2020   ALT 53 08/01/2020   AST 36 08/01/2020   NA 135 10/12/2020   K 4.6 10/12/2020   CL 99 10/12/2020   CREATININE 0.71 10/12/2020   BUN 11 10/12/2020   CO2 28 10/12/2020   TSH 2.31 08/01/2020   PSA 0.02 (L) 04/16/2018   HGBA1C 7.8 (H) 08/01/2020   MICROALBUR 7.6 (H) 07/23/2019    MR LUMBAR SPINE WO CONTRAST  Addendum Date: 05/01/2018   ADDENDUM REPORT: 05/01/2018 17:12 ADDENDUM: Chronic T12, L2, L3, and L5 compression fractures. Electronically Signed   By: Logan Bores M.D.   On: 05/01/2018 17:12   Result Date: 05/01/2018 CLINICAL DATA:  Lumbar radiculopathy. Low back and left leg pain for couple of months. EXAM: MRI LUMBAR SPINE WITHOUT CONTRAST TECHNIQUE: Multiplanar, multisequence MR imaging of the lumbar spine was performed. No intravenous contrast was administered. COMPARISON:  None. FINDINGS: Segmentation:  Standard. Alignment: Mild lumbar levoscoliosis. 3 mm anterolisthesis of L3 on L4. Vertebrae: T12, L2, L3, and L5 compression fractures with mild-to-moderate vertebral body height loss and no significant marrow edema. 3 mm retropulsion of the posterosuperior T12 vertebral body. Mild periarticular marrow and soft tissue edema associated with left L5-S1 facet arthritis. Conus medullaris and cauda equina: Conus extends to the lower L1 level. Conus and cauda equina appear normal. Paraspinal and other soft tissues: Mild left lower lumbar posterior paraspinal muscle, possibly strain. Disc levels: Disc desiccation throughout the lower thoracic and lumbar spine. Mild disc space narrowing at L2-3 and L4-5. T11-12: Mild T12 superior endplate retropulsion, mild disc bulging, and mild facet arthrosis result in borderline to mild spinal stenosis without neural foraminal stenosis. T12-L1: Mild disc bulging and moderate facet hypertrophy without stenosis. L1-2: Mild circumferential disc bulging and moderate facet hypertrophy result in minimal right neural foraminal narrowing without spinal stenosis. L2-3: Circumferential disc bulging, a broad right subarticular to foraminal disc protrusion, and moderate facet hypertrophy result in minimal right lateral recess narrowing without spinal or neural foraminal stenosis. L3-4: Anterolisthesis with slight bulging of uncovered disc, mildly prominent dorsal epidural fat, and severe facet hypertrophy result in mild-to-moderate spinal stenosis, mild bilateral lateral recess stenosis, and mild bilateral neural foraminal stenosis. There is  likely facet ankylosis bilaterally. L4-5: Disc bulging, mild ligamentum flavum hypertrophy, and moderate left greater than right facet hypertrophy result in mild spinal stenosis, mild-to-moderate left greater than right lateral recess stenosis, and  minimal bilateral neural foraminal stenosis. L5-S1: Disc bulging and severe left greater than right facet arthrosis result in mild left lateral recess stenosis and mild right and severe left neural foraminal stenosis with potential left L5 nerve root impingement. There is a left facet joint effusion, and a 6 x 3 mm synovial cyst is present anterior to the left facet joint in close proximity to the exiting left L5 nerve at the lateral aspect of the foramen though without frank nerve compression. IMPRESSION: 1. Severe left L5-S1 facet arthritis with associated joint effusion and edema. Severe left neural foraminal stenosis with potential left L5 nerve root impingement. 2. Mild-to-moderate spinal and lateral recess stenosis at L3-4 and L4-5. Electronically Signed: By: Logan Bores M.D. On: 05/01/2018 13:20       Assessment & Plan:   Problem List Items Addressed This Visit    Myasthenia gravis ([place])    On cellcept.  Stable. Follow cbc.       HYPERTENSION, BENIGN    Blood pressure on recheck improved.  Continue amlodipine and benicar.  Follow pressures.  Follow metabolic panel.       Hypercholesterolemia    On lovastatin.  Low cholesterol diet and exercise.  Follow lipid panel and liver function tests.        Relevant Orders   Lipid panel   Hepatic function panel   Diabetes mellitus ([place])    Low carb diet and exercise.  Sugars as outlined.  Follow met b and a1c.       Relevant Orders   Hemoglobin B5Z   Basic metabolic panel   Microalbumin / creatinine urine ratio   Cerumen impaction    Cerumen - bilateral ears.  Debrox daily.  Schedule ear irrigation (bilateral) - for next week.       Anemia    Follow cbc.  Continue iron qod.  Follow cbc and  iron studies.        Relevant Orders   CBC with Differential/Platelet   IBC + Ferritin       Einar Pheasant, MD

## 2020-10-15 ENCOUNTER — Encounter: Payer: Self-pay | Admitting: Internal Medicine

## 2020-10-15 DIAGNOSIS — H6120 Impacted cerumen, unspecified ear: Secondary | ICD-10-CM | POA: Insufficient documentation

## 2020-10-15 NOTE — Assessment & Plan Note (Signed)
Cerumen - bilateral ears.  Debrox daily.  Schedule ear irrigation (bilateral) - for next week.

## 2020-10-15 NOTE — Assessment & Plan Note (Addendum)
Follow cbc.  Continue iron qod.  Follow cbc and iron studies.

## 2020-10-15 NOTE — Assessment & Plan Note (Signed)
On cellcept. Stable.  Follow cbc. °

## 2020-10-15 NOTE — Assessment & Plan Note (Signed)
Low carb diet and exercise.  Sugars as outlined.  Follow met b and a1c.   

## 2020-10-15 NOTE — Assessment & Plan Note (Signed)
On lovastatin.  Low cholesterol diet and exercise.  Follow lipid panel and liver function tests.   

## 2020-10-15 NOTE — Assessment & Plan Note (Signed)
Blood pressure on recheck improved.  Continue amlodipine and benicar.  Follow pressures.  Follow metabolic panel.

## 2020-10-19 ENCOUNTER — Ambulatory Visit (INDEPENDENT_AMBULATORY_CARE_PROVIDER_SITE_OTHER): Payer: Medicare Other

## 2020-10-19 ENCOUNTER — Other Ambulatory Visit: Payer: Self-pay | Admitting: Internal Medicine

## 2020-10-19 ENCOUNTER — Other Ambulatory Visit: Payer: Self-pay

## 2020-10-19 DIAGNOSIS — H6123 Impacted cerumen, bilateral: Secondary | ICD-10-CM

## 2020-10-19 DIAGNOSIS — H9203 Otalgia, bilateral: Secondary | ICD-10-CM

## 2020-10-19 DIAGNOSIS — H938X3 Other specified disorders of ear, bilateral: Secondary | ICD-10-CM

## 2020-10-19 MED ORDER — OFLOXACIN 0.3 % OT SOLN
5.0000 [drp] | Freq: Two times a day (BID) | OTIC | 0 refills | Status: DC
Start: 2020-10-19 — End: 2021-01-13

## 2020-10-19 NOTE — Progress Notes (Signed)
Patient presented for ear irrigation due to cerumen impaction. Patient was informed of the possible side effects of having their ear flushed; light headedness, dizziness, nausea, vomiting and rupture ear drum. Items used or that can be used are the elephant pump, catch basin, ear curettes, hydrogen peroxide (half a bottle for ear irrigation solution), and a stool softener (1-2CC for softening ear wax).  Patient has given a verbal consent to have ear irrigation. patient voiced no concerns nor showed any signs of distress during procedure. Ear canal has become visibly clear. Both ear drums where very red and inflamed. Had Dr Olivia Mackie to look into ear, Dr. Olivia Mackie evaluated and decided to send in ABX ear Drops [facility].

## 2020-11-08 ENCOUNTER — Ambulatory Visit: Payer: Medicare Other

## 2020-11-10 ENCOUNTER — Ambulatory Visit (INDEPENDENT_AMBULATORY_CARE_PROVIDER_SITE_OTHER): Payer: Medicare Other | Admitting: *Deleted

## 2020-11-10 ENCOUNTER — Other Ambulatory Visit: Payer: Self-pay

## 2020-11-10 ENCOUNTER — Telehealth: Payer: Self-pay | Admitting: Internal Medicine

## 2020-11-10 DIAGNOSIS — E538 Deficiency of other specified B group vitamins: Secondary | ICD-10-CM

## 2020-11-10 MED ORDER — CYANOCOBALAMIN 1000 MCG/ML IJ SOLN
1000.0000 ug | Freq: Once | INTRAMUSCULAR | Status: AC
  Administered 2020-11-10: 1000 ug via INTRAMUSCULAR

## 2020-11-10 NOTE — Telephone Encounter (Signed)
Patient did not want to schedule AWV , he says he does not want one.

## 2020-11-10 NOTE — Progress Notes (Signed)
Patient presented for B 12 injection to right deltoid, patient voiced no concerns nor showed any signs of distress during injection. 

## 2020-11-11 ENCOUNTER — Telehealth: Payer: Self-pay | Admitting: Internal Medicine

## 2020-11-11 MED ORDER — ACCU-CHEK FASTCLIX LANCETS MISC: 0 refills | Status: DC

## 2020-11-11 NOTE — Telephone Encounter (Signed)
Pt called about refill on lancets and states that he needs them asap.

## 2020-11-11 NOTE — Addendum Note (Signed)
Addended by: Elpidio Galea T on: 11/11/2020 03:57 PM   Modules accepted: Orders

## 2020-11-14 NOTE — Telephone Encounter (Signed)
Noted. Will try again next year.  Thanks

## 2020-11-16 NOTE — Telephone Encounter (Signed)
Pt called and said that [facility] hasn't received the refill on lancets

## 2020-11-17 ENCOUNTER — Other Ambulatory Visit: Payer: Self-pay

## 2020-11-17 MED ORDER — ACCU-CHEK FASTCLIX LANCETS MISC: 3 refills | Status: DC

## 2020-11-17 NOTE — Telephone Encounter (Signed)
Reordered today

## 2020-11-18 ENCOUNTER — Other Ambulatory Visit: Payer: Self-pay

## 2020-11-18 MED ORDER — ACCU-CHEK COMPACT PLUS VI STRP: 1.0000 | ORAL_STRIP | 1 refills | Status: DC | PRN

## 2020-11-18 MED ORDER — BLOOD GLUCOSE METER KIT: PACK | 0 refills | Status: DC

## 2020-11-25 ENCOUNTER — Other Ambulatory Visit: Payer: Self-pay

## 2020-11-25 ENCOUNTER — Telehealth: Payer: Self-pay | Admitting: Internal Medicine

## 2020-11-25 MED ORDER — ACCU-CHEK FASTCLIX LANCETS MISC: 3 refills | Status: DC

## 2020-11-25 MED ORDER — GLUCOSE BLOOD VI STRP: ORAL_STRIP | 12 refills | Status: DC

## 2020-11-25 NOTE — Telephone Encounter (Signed)
Patient is calling having trouble getting his glucometer. I have sent over script multiple times. Called [facility]. Unable to reach. Told patient I would try again on Monday

## 2020-11-25 NOTE — Telephone Encounter (Signed)
Patient would like a call back from [facility].

## 2020-11-28 ENCOUNTER — Other Ambulatory Visit: Payer: Self-pay

## 2020-11-28 MED ORDER — GLUCOSE BLOOD VI STRP: ORAL_STRIP | 12 refills | Status: DC

## 2020-11-28 MED ORDER — ACCU-CHEK FASTCLIX LANCETS MISC: 3 refills | Status: DC

## 2020-11-28 NOTE — Telephone Encounter (Signed)
Scripts resent to [facility]

## 2020-11-29 NOTE — Telephone Encounter (Signed)
Pt states that the rx has to say "Used to test blood sugar once daily" for insurance purposes-please call pharmacy instead of sending. Please call pt this afternoon

## 2020-11-30 ENCOUNTER — Other Ambulatory Visit: Payer: Self-pay

## 2020-11-30 MED ORDER — GLUCOSE BLOOD VI STRP: ORAL_STRIP | 12 refills | Status: DC

## 2020-11-30 NOTE — Telephone Encounter (Signed)
Spoke with [facility]. Rx has been sent electronically and given over the phone. Pt was there to pick up while on the phone with the pharmacist.

## 2020-12-08 ENCOUNTER — Telehealth: Payer: Self-pay

## 2020-12-08 NOTE — Telephone Encounter (Signed)
Received fax stating needing medical necessity documentation for diabetic supplies. Faxed back with last clinical note.

## 2020-12-11 ENCOUNTER — Other Ambulatory Visit: Payer: Self-pay | Admitting: Internal Medicine

## 2020-12-13 ENCOUNTER — Other Ambulatory Visit: Payer: Self-pay

## 2020-12-13 ENCOUNTER — Ambulatory Visit (INDEPENDENT_AMBULATORY_CARE_PROVIDER_SITE_OTHER): Payer: Medicare Other

## 2020-12-13 DIAGNOSIS — E538 Deficiency of other specified B group vitamins: Secondary | ICD-10-CM | POA: Diagnosis not present

## 2020-12-13 MED ORDER — CYANOCOBALAMIN 1000 MCG/ML IJ SOLN
1000.0000 ug | Freq: Once | INTRAMUSCULAR | Status: AC
  Administered 2020-12-13: 1000 ug via INTRAMUSCULAR

## 2020-12-13 NOTE — Progress Notes (Signed)
Patient presented for B 12 injection to left deltoid, patient voiced no concerns nor showed any signs of distress during injection. 

## 2020-12-19 ENCOUNTER — Encounter: Payer: Self-pay | Admitting: Podiatry

## 2020-12-19 ENCOUNTER — Other Ambulatory Visit: Payer: Self-pay

## 2020-12-19 ENCOUNTER — Ambulatory Visit (INDEPENDENT_AMBULATORY_CARE_PROVIDER_SITE_OTHER): Payer: Medicare Other | Admitting: Podiatry

## 2020-12-19 DIAGNOSIS — B351 Tinea unguium: Secondary | ICD-10-CM | POA: Diagnosis not present

## 2020-12-19 DIAGNOSIS — M79675 Pain in left toe(s): Secondary | ICD-10-CM

## 2020-12-19 DIAGNOSIS — M79674 Pain in right toe(s): Secondary | ICD-10-CM | POA: Diagnosis not present

## 2020-12-19 DIAGNOSIS — E1142 Type 2 diabetes mellitus with diabetic polyneuropathy: Secondary | ICD-10-CM

## 2020-12-19 NOTE — Progress Notes (Signed)
This patient returns to my office for at risk foot care.  This patient requires this care by a professional since this patient will be at risk due to having type 2 diabetes.    This patient is unable to cut nails himself since the patient cannot reach his nails.These nails are painful walking and wearing shoes.   This patient presents for at risk foot care today.  General Appearance  Alert, conversant and in no acute stress.  Vascular  Dorsalis pedis and posterior tibial  pulses are weakly  palpable  bilaterally.  Capillary return is within normal limits  bilaterally. Temperature is within normal limits  bilaterally.  Neurologic  Senn-Weinstein monofilament wire test within normal limits  bilaterally. Muscle power within normal limits bilaterally.  Nails Thick disfigured discolored nails with subungual debris  from hallux to fifth toes bilaterally. No evidence of bacterial infection or drainage bilaterally.  Orthopedic  No limitations of motion  feet .  No crepitus or effusions noted.  No bony pathology or digital deformities noted.  Skin  normotropic skin with no porokeratosis noted bilaterally.  No signs of infections or ulcers noted.   Asymptomatic callus sub 5th  Left.  Onychomycosis  Pain in right toes  Pain in left toes  Consent was obtained for treatment procedures.   Mechanical debridement of nails 1-5  bilaterally performed with a nail nipper.  Filed with dremel without incident. .   .     Return office visit   3 months                  Told patient to return for periodic foot care and evaluation due to potential at risk complications.   Gardiner Barefoot DPM

## 2021-01-07 ENCOUNTER — Other Ambulatory Visit: Payer: Self-pay | Admitting: Internal Medicine

## 2021-01-07 DIAGNOSIS — E119 Type 2 diabetes mellitus without complications: Secondary | ICD-10-CM

## 2021-01-11 ENCOUNTER — Other Ambulatory Visit (INDEPENDENT_AMBULATORY_CARE_PROVIDER_SITE_OTHER): Payer: Medicare Other

## 2021-01-11 ENCOUNTER — Other Ambulatory Visit: Payer: Self-pay

## 2021-01-11 DIAGNOSIS — D649 Anemia, unspecified: Secondary | ICD-10-CM

## 2021-01-11 DIAGNOSIS — E1165 Type 2 diabetes mellitus with hyperglycemia: Secondary | ICD-10-CM

## 2021-01-11 DIAGNOSIS — E7800 Pure hypercholesterolemia, unspecified: Secondary | ICD-10-CM

## 2021-01-11 LAB — LIPID PANEL
Cholesterol: 133 mg/dL (ref 0–200)
HDL: 46.6 mg/dL (ref >39.00)
LDL Cholesterol: 63 mg/dL (ref 0–99)
NonHDL: 86.51
Total CHOL/HDL Ratio: 3
Triglycerides: 120 mg/dL (ref 0.0–149.0)
VLDL: 24 mg/dL (ref 0.0–40.0)

## 2021-01-11 LAB — HEPATIC FUNCTION PANEL
ALT: 14 U/L (ref 0–53)
AST: 15 U/L (ref 0–37)
Albumin: 3.9 g/dL (ref 3.5–5.2)
Alkaline Phosphatase: 39 U/L (ref 39–117)
Bilirubin, Direct: 0.2 mg/dL (ref 0.0–0.3)
Total Bilirubin: 1 mg/dL (ref 0.2–1.2)
Total Protein: 7.3 g/dL (ref 6.0–8.3)

## 2021-01-11 LAB — BASIC METABOLIC PANEL
BUN: 9 mg/dL (ref 6–23)
CO2: 28 mEq/L (ref 19–32)
Calcium: 9.6 mg/dL (ref 8.4–10.5)
Chloride: 98 mEq/L (ref 96–112)
Creatinine, Ser: 0.67 mg/dL (ref 0.40–1.50)
GFR: 81.18 mL/min (ref >60.00)
Glucose, Bld: 144 mg/dL — ABNORMAL HIGH (ref 70–99)
Potassium: 4.6 mEq/L (ref 3.5–5.1)
Sodium: 136 mEq/L (ref 135–145)

## 2021-01-11 LAB — MICROALBUMIN / CREATININE URINE RATIO
Creatinine,U: 84.4 mg/dL
Microalb Creat Ratio: 51.4 mg/g — ABNORMAL HIGH (ref 0.0–30.0)
Microalb, Ur: 43.4 mg/dL — ABNORMAL HIGH (ref 0.0–1.9)

## 2021-01-11 LAB — CBC WITH DIFFERENTIAL/PLATELET
Basophils Absolute: 0.1 10*3/uL (ref 0.0–0.1)
Basophils Relative: 1.2 % (ref 0.0–3.0)
Eosinophils Absolute: 0.1 10*3/uL (ref 0.0–0.7)
Eosinophils Relative: 1.9 % (ref 0.0–5.0)
HCT: 38.3 % — ABNORMAL LOW (ref 39.0–52.0)
Hemoglobin: 12.9 g/dL — ABNORMAL LOW (ref 13.0–17.0)
Lymphocytes Relative: 17.8 % (ref 12.0–46.0)
Lymphs Abs: 1.2 10*3/uL (ref 0.7–4.0)
MCHC: 33.7 g/dL (ref 30.0–36.0)
MCV: 91.4 fl (ref 78.0–100.0)
Monocytes Absolute: 0.5 10*3/uL (ref 0.1–1.0)
Monocytes Relative: 7.6 % (ref 3.0–12.0)
Neutro Abs: 4.8 10*3/uL (ref 1.4–7.7)
Neutrophils Relative %: 71.5 % (ref 43.0–77.0)
Platelets: 311 10*3/uL (ref 150.0–400.0)
RBC: 4.19 Mil/uL — ABNORMAL LOW (ref 4.22–5.81)
RDW: 13.5 % (ref 11.5–15.5)
WBC: 6.7 10*3/uL (ref 4.0–10.5)

## 2021-01-11 LAB — HEMOGLOBIN A1C: Hgb A1c MFr Bld: 7.2 % — ABNORMAL HIGH (ref 4.6–6.5)

## 2021-01-11 LAB — IBC + FERRITIN
Ferritin: 82.2 ng/mL (ref 22.0–322.0)
Iron: 84 ug/dL (ref 42–165)
Saturation Ratios: 27.9 % (ref 20.0–50.0)
Transferrin: 215 mg/dL (ref 212.0–360.0)

## 2021-01-13 ENCOUNTER — Ambulatory Visit (INDEPENDENT_AMBULATORY_CARE_PROVIDER_SITE_OTHER): Payer: Medicare Other | Admitting: Internal Medicine

## 2021-01-13 ENCOUNTER — Other Ambulatory Visit: Payer: Self-pay

## 2021-01-13 VITALS — BP 122/70 | HR 76 | Temp 97.6°F | Resp 16 | Ht 71.0 in | Wt 223.0 lb

## 2021-01-13 DIAGNOSIS — I10 Essential (primary) hypertension: Secondary | ICD-10-CM

## 2021-01-13 DIAGNOSIS — E7800 Pure hypercholesterolemia, unspecified: Secondary | ICD-10-CM | POA: Diagnosis not present

## 2021-01-13 DIAGNOSIS — Z0000 Encounter for general adult medical examination without abnormal findings: Secondary | ICD-10-CM

## 2021-01-13 DIAGNOSIS — D649 Anemia, unspecified: Secondary | ICD-10-CM

## 2021-01-13 DIAGNOSIS — E538 Deficiency of other specified B group vitamins: Secondary | ICD-10-CM | POA: Diagnosis not present

## 2021-01-13 DIAGNOSIS — E1165 Type 2 diabetes mellitus with hyperglycemia: Secondary | ICD-10-CM

## 2021-01-13 DIAGNOSIS — G7000 Myasthenia gravis without (acute) exacerbation: Secondary | ICD-10-CM

## 2021-01-13 MED ORDER — CYANOCOBALAMIN 1000 MCG/ML IJ SOLN
1000.0000 ug | Freq: Once | INTRAMUSCULAR | Status: AC
  Administered 2021-01-13: 1000 ug via INTRAMUSCULAR

## 2021-01-13 NOTE — Progress Notes (Signed)
Patient ID: GREEN QUINCY, male   DOB: 06/04/1926, 87 y.o.   MRN: [id number]   Subjective:    Patient ID: CANDLER GINSBERG, male    DOB: 21-Dec-1925, 87 y.o.   MRN: [id number]  HPI This visit occurred during the SARS-CoV-2 public health emergency.  Safety protocols were in place, including screening questions prior to the visit, additional usage of staff PPE, and extensive cleaning of exam room while observing appropriate contact time as indicated for disinfecting solutions.  Patient with past history of diabetes, myasthenia gravis and hypercholesterolemia. He comes in today to follow up on these issues as well as for a complete physical exam.  He reports he is doing relatively well.  No chest pain or sob reported.  No abdominal pain or bowel change reported.  States blood sugars ok.  Has started walking some.  Riding bike.    Past Medical History:  Diagnosis Date  . Allergic rhinitis   . Basal cell carcinoma 01/29/2007   Right mid forehead. Excised: 03/24/2007, margins free.  Marland Kitchen BPH (benign prostatic hypertrophy)   . Degenerative arthritis   . Diabetes mellitus ([place])   . GERD (gastroesophageal reflux disease)   . HTN (hypertension)   . Hx of basal cell carcinoma 06/29/2020   L crown scalp, EDC  . Hypercholesterolemia   . HYPERTENSION, BENIGN 08/15/2010   Qualifier: Diagnosis of  By: Rockey Situ MD, Tim    . Iron deficiency anemia   . 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 Excised 07/27/2014, residual focus AFX, margins free.  Marland Kitchen Squamous cell carcinoma of skin 04/01/2007   Right forehead, 2cm above mid brow. WD SCC wtih superficial infiltration. Excised: 05/14/2007, margins free  . Squamous cell carcinoma of skin 05/16/2016   Crown. WD SCC, ulcerated. Excised: 07/03/2016, residual MD SCC, deep margin involved. Excised: 07/17/2016, margins free.  Marland Kitchen TIA (transient ischemic attack) 09/13/2015  . Urinary outflow obstruction     mild   Past Surgical History:  Procedure Laterality Date  . PARTIAL HIP ARTHROPLASTY  01/2019  . PILONIDAL CYST EXCISION  1951   removal  . [place]  . SKIN CANCER EXCISION     multiple  . SQUAMOUS CELL CARCINOMA EXCISION     behind head  . TONSILLECTOMY AND ADENOIDECTOMY  1938   Family History  Problem Relation Age of Onset  . Heart disease Father        heart atack  . Alzheimer's disease Mother   . CVA Brother    Social History   Socioeconomic History  . Marital status: Married    Spouse name: Not on file  . Number of children: Not on file  . Years of education: Not on file  . Highest education level: Not on file  Occupational History  . Not on file  Tobacco Use  . Smoking status: Never Smoker  . Smokeless tobacco: Never Used  Substance and Sexual Activity  . Alcohol use: No    Alcohol/week: 0.0 standard drinks  . Drug use: No  . Sexual activity: Not Currently    Birth control/protection: None  Other Topics Concern  . Not on file  Social History Narrative   Retired, married. Walks everyday         Social Determinants of Health   Financial Resource Strain: Not on file  Food Insecurity: Not on file  Transportation Needs: Not on file  Physical Activity: Not on file  Stress: Not on  file  Social Connections: Not on file    Outpatient Encounter Medications as of 01/13/2021  Medication Sig  . Accu-Chek FastClix Lancets MISC USE 1  TO CHECK SUGARS ONCE DAILY. Dx E11.9  . amLODipine (NORVASC) 5 MG tablet TAKE 1 TABLET(5 MG) BY MOUTH DAILY  . aspirin 81 MG EC tablet Take 81 mg by mouth daily.  . calcium-vitamin D (OSCAL WITH D) 500-200 MG-UNIT tablet Take 1 tablet by mouth 2 (two) times daily.  . ferrous sulfate 325 (65 FE) MG EC tablet Take 325 mg by mouth 3 (three) times daily with meals.  . finasteride (PROSCAR) 5 MG tablet TAKE 1 TABLET(5 MG) BY MOUTH DAILY  . furosemide (LASIX) 20 MG tablet Take 1 tablet (20 mg total) by mouth daily.  Marland Kitchen  glucose blood (ACCU-CHEK COMPACT PLUS) test strip 1 each by Other route as needed for other. DX: E11.9  . lovastatin (MEVACOR) 20 MG tablet TAKE 1 TABLET(20 MG) BY MOUTH AT BEDTIME  . metFORMIN (GLUCOPHAGE-XR) 500 MG 24 hr tablet TAKE 1 TABLET BY MOUTH EVERY DAY WITH BREAKFAST  . olmesartan (BENICAR) 40 MG tablet TAKE 1 TABLET(40 MG) BY MOUTH DAILY  . tamsulosin (FLOMAX) 0.4 MG CAPS capsule TAKE 1 CAPSULE(0.4 MG) BY MOUTH DAILY AFTER SUPPER  . [DISCONTINUED] blood glucose meter kit and supplies Dispense based on patient and insurance preference. Use up to four times daily as directed. (FOR ICD-10 E10.9, E11.9).  . [DISCONTINUED] carbamide peroxide (DEBROX) 6.5 % OTIC solution 4-5 drops in both ears daily.  Massage for approximately 5 minutes.  . [DISCONTINUED] clindamycin (CLEOCIN-T) 1 % lotion Apply topically in the morning and at bedtime. Bid to chin  . [DISCONTINUED] glucose blood test strip Use to check blood sugars once daily. Dx E11.9  . [DISCONTINUED] ofloxacin (FLOXIN) 0.3 % OTIC solution Place 5 drops into both ears 2 (two) times daily. X 5-10 days  . [DISCONTINUED] UNABLE TO FIND Use once daily to check blood sugars Dx 250.00  . [EXPIRED] cyanocobalamin ((VITAMIN B-12)) injection 1,000 mcg    No facility-administered encounter medications on file as of 01/13/2021.    Review of Systems  Constitutional: Negative for appetite change and unexpected weight change.  HENT: Negative for congestion, sinus pressure and sore throat.   Eyes: Negative for pain and visual disturbance.  Respiratory: Negative for cough, chest tightness and shortness of breath.   Cardiovascular: Negative for chest pain, palpitations and leg swelling.  Gastrointestinal: Negative for abdominal pain, diarrhea, nausea and vomiting.  Genitourinary: Negative for difficulty urinating and dysuria.  Musculoskeletal: Negative for joint swelling and myalgias.  Skin: Negative for color change and rash.  Neurological: Negative  for dizziness, light-headedness and headaches.  Hematological: Negative for adenopathy. Does not bruise/bleed easily.  Psychiatric/Behavioral: Negative for agitation and dysphoric mood.       Objective:    Physical Exam Vitals reviewed.  Constitutional:      General: He is not in acute distress.    Appearance: Normal appearance. He is well-developed.  HENT:     Head: Normocephalic and atraumatic.     Right Ear: External ear normal.     Left Ear: External ear normal.  Eyes:     General: No scleral icterus.       Right eye: No discharge.        Left eye: No discharge.     Conjunctiva/sclera: Conjunctivae normal.  Neck:     Thyroid: No thyromegaly.  Cardiovascular:     Rate and Rhythm: Normal rate  and regular rhythm.  Pulmonary:     Effort: No respiratory distress.     Breath sounds: Normal breath sounds. No wheezing.  Abdominal:     General: Bowel sounds are normal.     Palpations: Abdomen is soft.     Tenderness: There is no abdominal tenderness.  Musculoskeletal:        General: No swelling or tenderness.     Cervical back: Neck supple. No tenderness.  Lymphadenopathy:     Cervical: No cervical adenopathy.  Skin:    Findings: No erythema or rash.  Neurological:     Mental Status: He is alert and oriented to person, place, and time.  Psychiatric:        Mood and Affect: Mood normal.        Behavior: Behavior normal.     BP 122/70   Pulse 76   Temp 97.6 F (36.4 C) (Oral)   Resp 16   Ht _0  (1.803 m)   Wt 223 lb (101.2 kg)   SpO2 99%   BMI 31.10 kg/m  Wt Readings from Last 3 Encounters:  01/13/21 223 lb (101.2 kg)  10/14/20 225 lb 3.2 oz (102.2 kg)  09/22/20 219 lb (99.3 kg)     Lab Results  Component Value Date   WBC 6.7 01/11/2021   HGB 12.9 (L) 01/11/2021   HCT 38.3 (L) 01/11/2021   PLT 311.0 01/11/2021   GLUCOSE 144 (H) 01/11/2021   CHOL 133 01/11/2021   TRIG 120.0 01/11/2021   HDL 46.60 01/11/2021   LDLCALC 63 01/11/2021   ALT 14  01/11/2021   AST 15 01/11/2021   NA 136 01/11/2021   K 4.6 01/11/2021   CL 98 01/11/2021   CREATININE 0.67 01/11/2021   BUN 9 01/11/2021   CO2 28 01/11/2021   TSH 2.31 08/01/2020   PSA 0.02 (L) 04/16/2018   HGBA1C 7.2 (H) 01/11/2021   MICROALBUR 43.4 (H) 01/11/2021    MR LUMBAR SPINE WO CONTRAST  Addendum Date: 05/01/2018   ADDENDUM REPORT: 05/01/2018 17:12 ADDENDUM: Chronic T12, L2, L3, and L5 compression fractures. Electronically Signed   By: Logan Bores M.D.   On: 05/01/2018 17:12   Result Date: 05/01/2018 CLINICAL DATA:  Lumbar radiculopathy. Low back and left leg pain for couple of months. EXAM: MRI LUMBAR SPINE WITHOUT CONTRAST TECHNIQUE: Multiplanar, multisequence MR imaging of the lumbar spine was performed. No intravenous contrast was administered. COMPARISON:  None. FINDINGS: Segmentation:  Standard. Alignment: Mild lumbar levoscoliosis. 3 mm anterolisthesis of L3 on L4. Vertebrae: T12, L2, L3, and L5 compression fractures with mild-to-moderate vertebral body height loss and no significant marrow edema. 3 mm retropulsion of the posterosuperior T12 vertebral body. Mild periarticular marrow and soft tissue edema associated with left L5-S1 facet arthritis. Conus medullaris and cauda equina: Conus extends to the lower L1 level. Conus and cauda equina appear normal. Paraspinal and other soft tissues: Mild left lower lumbar posterior paraspinal muscle, possibly strain. Disc levels: Disc desiccation throughout the lower thoracic and lumbar spine. Mild disc space narrowing at L2-3 and L4-5. T11-12: Mild T12 superior endplate retropulsion, mild disc bulging, and mild facet arthrosis result in borderline to mild spinal stenosis without neural foraminal stenosis. T12-L1: Mild disc bulging and moderate facet hypertrophy without stenosis. L1-2: Mild circumferential disc bulging and moderate facet hypertrophy result in minimal right neural foraminal narrowing without spinal stenosis. L2-3:  Circumferential disc bulging, a broad right subarticular to foraminal disc protrusion, and moderate facet hypertrophy result in minimal right  lateral recess narrowing without spinal or neural foraminal stenosis. L3-4: Anterolisthesis with slight bulging of uncovered disc, mildly prominent dorsal epidural fat, and severe facet hypertrophy result in mild-to-moderate spinal stenosis, mild bilateral lateral recess stenosis, and mild bilateral neural foraminal stenosis. There is likely facet ankylosis bilaterally. L4-5: Disc bulging, mild ligamentum flavum hypertrophy, and moderate left greater than right facet hypertrophy result in mild spinal stenosis, mild-to-moderate left greater than right lateral recess stenosis, and minimal bilateral neural foraminal stenosis. L5-S1: Disc bulging and severe left greater than right facet arthrosis result in mild left lateral recess stenosis and mild right and severe left neural foraminal stenosis with potential left L5 nerve root impingement. There is a left facet joint effusion, and a 6 x 3 mm synovial cyst is present anterior to the left facet joint in close proximity to the exiting left L5 nerve at the lateral aspect of the foramen though without frank nerve compression. IMPRESSION: 1. Severe left L5-S1 facet arthritis with associated joint effusion and edema. Severe left neural foraminal stenosis with potential left L5 nerve root impingement. 2. Mild-to-moderate spinal and lateral recess stenosis at L3-4 and L4-5. Electronically Signed: By: Logan Bores M.D. On: 05/01/2018 13:20       Assessment & Plan:   Problem List Items Addressed This Visit    Anemia    Follow cbc.       B12 deficiency - Primary   Diabetes mellitus ([place])    Low carb diet and exercise.  Sugars improved.  a1c 7.2.  Continue metformin.  Follow met b and a1c.       Relevant Orders   Hemoglobin A1c   Health care maintenance    Physical today 01/13/21.       Hypercholesterolemia    On  lovastatin.  Low cholesterol diet and exercise.  Follow lipid panel and liver function tests.        Relevant Orders   Lipid panel   Hepatic function panel   HYPERTENSION, BENIGN    Continue amlodipine and benicar.  Blood pressures doing well.  Follow pressures.  Follow metabolic panel.       Relevant Orders   Basic metabolic panel   Myasthenia gravis ([place])    On cellcept. Stable.  Follow cbc.      Relevant Orders   CBC with Differential/Platelet       Einar Pheasant, MD

## 2021-01-22 ENCOUNTER — Encounter: Payer: Self-pay | Admitting: Internal Medicine

## 2021-01-22 NOTE — Assessment & Plan Note (Signed)
On lovastatin.  Low cholesterol diet and exercise.  Follow lipid panel and liver function tests.   

## 2021-01-22 NOTE — Assessment & Plan Note (Signed)
On cellcept. Stable.  Follow cbc.

## 2021-01-22 NOTE — Assessment & Plan Note (Signed)
Physical today 01/13/21.

## 2021-01-22 NOTE — Assessment & Plan Note (Signed)
Continue amlodipine and benicar.  Blood pressures doing well.  Follow pressures.  Follow metabolic panel.

## 2021-01-22 NOTE — Assessment & Plan Note (Signed)
Follow cbc.  

## 2021-01-22 NOTE — Assessment & Plan Note (Signed)
Low carb diet and exercise.  Sugars improved.  a1c 7.2.  Continue metformin.  Follow met b and a1c.

## 2021-01-25 ENCOUNTER — Telehealth: Payer: Self-pay

## 2021-01-25 ENCOUNTER — Other Ambulatory Visit: Payer: Self-pay

## 2021-01-25 MED ORDER — ACCU-CHEK COMPACT PLUS VI STRP: 1.0000 | ORAL_STRIP | 1 refills | Status: DC | PRN

## 2021-01-25 NOTE — Telephone Encounter (Signed)
Glucose blood test strips were refilled and sent to pharmacy. Called [name] to inform him of the refill, [name] states that he was waiting on a form to be filled out in order for his insurance to pay for his Test Strips. He states that he has already spoken to [name] and has recalled his pharmacist today and has had the forms faxed over to our office. [name] did not know what kind of form was sent over to be filled out.

## 2021-01-25 NOTE — Telephone Encounter (Signed)
Pt called and asked to speak with Trish about glucose blood (ACCU-CHEK COMPACT PLUS) test strip-He states that he is having a hard time getting  A refill and he only has a couple left.

## 2021-01-26 ENCOUNTER — Telehealth: Payer: Self-pay | Admitting: Internal Medicine

## 2021-01-26 NOTE — Telephone Encounter (Signed)
See other note for documentation 

## 2021-01-26 NOTE — Telephone Encounter (Signed)
Form was received and faxed back with OV note earlier in the week. Called [facility] to determine why he still cannot pick up his strips. Medicare has audited [facility] and need additional information for all diabetic patients on Medicare. Advised to pharmacist that this has been sent over. They did not receive. I have resent and received confirmation that fax went through. Patient is aware of this.

## 2021-01-26 NOTE — Telephone Encounter (Signed)
Patient called in about his test stripes wanted a call back

## 2021-02-01 ENCOUNTER — Ambulatory Visit (INDEPENDENT_AMBULATORY_CARE_PROVIDER_SITE_OTHER): Payer: Medicare Other | Admitting: Dermatology

## 2021-02-01 ENCOUNTER — Other Ambulatory Visit: Payer: Self-pay

## 2021-02-01 DIAGNOSIS — L820 Inflamed seborrheic keratosis: Secondary | ICD-10-CM | POA: Diagnosis not present

## 2021-02-01 DIAGNOSIS — L578 Other skin changes due to chronic exposure to nonionizing radiation: Secondary | ICD-10-CM | POA: Diagnosis not present

## 2021-02-01 DIAGNOSIS — D492 Neoplasm of unspecified behavior of bone, soft tissue, and skin: Secondary | ICD-10-CM

## 2021-02-01 DIAGNOSIS — L570 Actinic keratosis: Secondary | ICD-10-CM

## 2021-02-01 DIAGNOSIS — D485 Neoplasm of uncertain behavior of skin: Secondary | ICD-10-CM

## 2021-02-01 HISTORY — DX: Actinic keratosis: L57.0

## 2021-02-01 NOTE — Patient Instructions (Addendum)
If you have any questions or concerns for your doctor, please call our main line at [phone] and press option 4 to reach your doctor's medical assistant. If no one answers, please leave a voicemail as directed and we will return your call as soon as possible. Messages left after 4 pm will be answered the following business day.   You may also send us a message via MyChart. We typically respond to MyChart messages within 1-2 business days.  For prescription refills, please ask your pharmacy to contact our office. Our fax number is [phone].  If you have an urgent issue when the clinic is closed that cannot wait until the next business day, you can page your doctor at the number below.    Please note that while we do our best to be available for urgent issues outside of office hours, we are not available 24/7.   If you have an urgent issue and are unable to reach us, you may choose to seek medical care at your doctor's office, retail clinic, urgent care center, or emergency room.  If you have a medical emergency, please immediately call 911 or go to the emergency department.  Pager Numbers  - Dr. Kowalski: [phone]  - Dr. Moye: [phone]  - Dr. Stewart: [phone]  In the event of inclement weather, please call our main line at [phone] for an update on the status of any delays or closures.  Dermatology Medication Tips: Please keep the boxes that topical medications come in in order to help keep track of the instructions about where and how to use these. Pharmacies typically print the medication instructions only on the boxes and not directly on the medication tubes.   If your medication is too expensive, please contact our office at [phone] option 4 or send us a message through MyChart.   We are unable to tell what your co-pay for medications will be in advance as this is different depending on your insurance coverage. However, we may be able to find a  substitute medication at lower cost or fill out paperwork to get insurance to cover a needed medication.   If a prior authorization is required to get your medication covered by your insurance company, please allow us 1-2 business days to complete this process.  Drug prices often vary depending on where the prescription is filled and some pharmacies may offer cheaper prices.  The website www.goodrx.com contains coupons for medications through different pharmacies. The prices here do not account for what the cost may be with help from insurance (it may be cheaper with your insurance), but the website can give you the price if you did not use any insurance.  - You can print the associated coupon and take it with your prescription to the pharmacy.  - You may also stop by our office during regular business hours and pick up a GoodRx coupon card.  - If you need your prescription sent electronically to a different pharmacy, notify our office through [facility] MyChart or by phone at [phone] option 4.     Wound Care Instructions  1. Cleanse wound gently with soap and water once a day then pat dry with clean gauze. Apply a thing coat of Petrolatum (petroleum jelly, "Vaseline") over the wound (unless you have an allergy to this). We recommend that you use a new, sterile tube of Vaseline. Do not pick or remove scabs. Do not remove the yellow or white "healing tissue" from the base of the wound.    2. Cover the wound with fresh, clean, nonstick gauze and secure with paper tape. You may use Band-Aids in place of gauze and tape if the would is small enough, but would recommend trimming much of the tape off as there is often too much. Sometimes Band-Aids can irritate the skin.  3. You should call the office for your biopsy report after 1 week if you have not already been contacted.  4. If you experience any problems, such as abnormal amounts of bleeding, swelling, significant bruising, significant pain,  or evidence of infection, please call the office immediately.  5. FOR ADULT SURGERY PATIENTS: If you need something for pain relief you may take 1 extra strength Tylenol (acetaminophen) AND 2 Ibuprofen (200mg each) together every 4 hours as needed for pain. (do not take these if you are allergic to them or if you have a reason you should not take them.) Typically, you may only need pain medication for 1 to 3 days.     

## 2021-02-01 NOTE — Progress Notes (Signed)
   Follow-Up Visit   Subjective  [name] is a 87 y.o. male who presents for the following: Actinic Keratosis (Scalp, face, ears, 67m f/u), folliculitis (Chin, clindamycin lotion ~qd), and ISK (Scalp x 1, 79m f/u).  The following portions of the chart were reviewed this encounter and updated as appropriate:   Tobacco  Allergies  Meds  Problems  Med Hx  Surg Hx  Fam Hx     Review of Systems:  No other skin or systemic complaints except as noted in HPI or Assessment and Plan.  Objective  Well appearing patient in no apparent distress; mood and affect are within normal limits.  A focused examination was performed including face, scalp, ears. Relevant physical exam findings are noted in the Assessment and Plan.  Objective  Scalp x 1: Erythematous keratotic or waxy stuck-on papule or plaque.   Objective  midline ant chin: Pearly pap 1.0cm     Objective  face x 4 (4): Pink scaly macules    Assessment & Plan    Actinic Damage - chronic, secondary to cumulative UV radiation exposure/sun exposure over time - diffuse scaly erythematous macules with underlying dyspigmentation - Recommend daily broad spectrum sunscreen SPF 30+ to sun-exposed areas, reapply every 2 hours as needed.  - Recommend staying in the shade or wearing long sleeves, sun glasses (UVA+UVB protection) and wide brim hats (4-inch brim around the entire circumference of the hat). - Call for new or changing lesions.  Inflamed seborrheic keratosis Scalp x 1  Destruction of lesion - Scalp x 1 Complexity: simple   Destruction method: cryotherapy   Informed consent: discussed and consent obtained   Timeout:  patient name, date of birth, surgical site, and procedure verified Lesion destroyed using liquid nitrogen: Yes   Region frozen until ice ball extended beyond lesion: Yes   Outcome: patient tolerated procedure well with no complications   Post-procedure details: wound care instructions given     Neoplasm of skin midline ant chin  Skin / nail biopsy Type of biopsy: tangential   Informed consent: discussed and consent obtained   Timeout: patient name, date of birth, surgical site, and procedure verified   Procedure prep:  Patient was prepped and draped in usual sterile fashion Prep type:  Isopropyl alcohol Anesthesia: the lesion was anesthetized in a standard fashion   Anesthetic:  1% lidocaine w/ epinephrine 1-100,000 buffered w/ 8.4% NaHCO3 Instrument used: flexible razor blade   Outcome: patient tolerated procedure well   Post-procedure details: sterile dressing applied and wound care instructions given   Dressing type: bandage and bacitracin    Specimen 1 - Surgical pathology Differential Diagnosis: D48.5 R/O BCC  Check Margins: No Pearly pap 1.0cm  AK (actinic keratosis) (4) face x 4  Destruction of lesion - face x 4 Complexity: simple   Destruction method: cryotherapy   Informed consent: discussed and consent obtained   Timeout:  patient name, date of birth, surgical site, and procedure verified Lesion destroyed using liquid nitrogen: Yes   Region frozen until ice ball extended beyond lesion: Yes   Outcome: patient tolerated procedure well with no complications   Post-procedure details: wound care instructions given    Return in about 1 month (around 03/04/2021) for UBSE, Hx of AKs.   I, Othelia Pulling, RMA, am acting as scribe for Sarina Ser, MD .  Documentation: I have reviewed the above documentation for accuracy and completeness, and I agree with the above.  Sarina Ser, MD

## 2021-02-02 ENCOUNTER — Encounter: Payer: Self-pay | Admitting: Dermatology

## 2021-02-04 ENCOUNTER — Other Ambulatory Visit: Payer: Self-pay | Admitting: Internal Medicine

## 2021-02-04 DIAGNOSIS — I10 Essential (primary) hypertension: Secondary | ICD-10-CM

## 2021-02-07 ENCOUNTER — Telehealth: Payer: Self-pay

## 2021-02-07 NOTE — Telephone Encounter (Signed)
Discussed biopsy results with pt, return for LN2

## 2021-02-07 NOTE — Telephone Encounter (Signed)
-----   Message from Ralene Bathe, MD sent at 02/05/2021  1:42 PM EDT ----- Diagnosis Skin , midline anterior chin ACTINIC KERATOSIS, INFLAMED  Inflamed PreCancer Schedule for treatment (LN2)

## 2021-02-08 ENCOUNTER — Encounter: Payer: Self-pay | Admitting: Internal Medicine

## 2021-02-14 ENCOUNTER — Ambulatory Visit (INDEPENDENT_AMBULATORY_CARE_PROVIDER_SITE_OTHER): Payer: Medicare Other

## 2021-02-14 ENCOUNTER — Other Ambulatory Visit: Payer: Self-pay

## 2021-02-14 DIAGNOSIS — E538 Deficiency of other specified B group vitamins: Secondary | ICD-10-CM

## 2021-02-14 MED ORDER — CYANOCOBALAMIN 1000 MCG/ML IJ SOLN
1000.0000 ug | Freq: Once | INTRAMUSCULAR | Status: AC
  Administered 2021-02-14: 1000 ug via INTRAMUSCULAR

## 2021-02-14 NOTE — Progress Notes (Signed)
Patient presented for B 12 injection to right deltoid, patient voiced no concerns nor showed any signs of distress during injection. 

## 2021-03-16 ENCOUNTER — Other Ambulatory Visit: Payer: Self-pay

## 2021-03-16 ENCOUNTER — Ambulatory Visit (INDEPENDENT_AMBULATORY_CARE_PROVIDER_SITE_OTHER): Payer: Medicare Other

## 2021-03-16 DIAGNOSIS — E538 Deficiency of other specified B group vitamins: Secondary | ICD-10-CM | POA: Diagnosis not present

## 2021-03-16 MED ORDER — CYANOCOBALAMIN 1000 MCG/ML IJ SOLN
1000.0000 ug | Freq: Once | INTRAMUSCULAR | Status: AC
  Administered 2021-03-16: 1000 ug via INTRAMUSCULAR

## 2021-03-16 NOTE — Progress Notes (Signed)
Patient came in today for monthly B-12 injection in left deltoid IM. Patient tolerated well with no signs of distress.

## 2021-03-20 ENCOUNTER — Ambulatory Visit: Payer: Medicare Other | Admitting: Podiatry

## 2021-03-23 ENCOUNTER — Encounter: Payer: Self-pay | Admitting: Podiatry

## 2021-03-23 ENCOUNTER — Other Ambulatory Visit: Payer: Self-pay

## 2021-03-23 ENCOUNTER — Ambulatory Visit (INDEPENDENT_AMBULATORY_CARE_PROVIDER_SITE_OTHER): Payer: Medicare Other | Admitting: Podiatry

## 2021-03-23 DIAGNOSIS — E1142 Type 2 diabetes mellitus with diabetic polyneuropathy: Secondary | ICD-10-CM

## 2021-03-23 DIAGNOSIS — B351 Tinea unguium: Secondary | ICD-10-CM

## 2021-03-23 DIAGNOSIS — M79674 Pain in right toe(s): Secondary | ICD-10-CM | POA: Diagnosis not present

## 2021-03-23 DIAGNOSIS — M79675 Pain in left toe(s): Secondary | ICD-10-CM

## 2021-03-23 NOTE — Progress Notes (Signed)
This patient returns to my office for at risk foot care.  This patient requires this care by a professional since this patient will be at risk due to having type 2 diabetes.    This patient is unable to cut nails himself since the patient cannot reach his nails.These nails are painful walking and wearing shoes.   This patient presents for at risk foot care today.  General Appearance  Alert, conversant and in no acute stress.  Vascular  Dorsalis pedis and posterior tibial  pulses are weakly  palpable  bilaterally.  Capillary return is within normal limits  bilaterally. Temperature is within normal limits  bilaterally.  Neurologic  Senn-Weinstein monofilament wire test within normal limits  bilaterally. Muscle power within normal limits bilaterally.  Nails Thick disfigured discolored nails with subungual debris  from hallux to fifth toes bilaterally. No evidence of bacterial infection or drainage bilaterally.  Orthopedic  No limitations of motion  feet .  No crepitus or effusions noted.  No bony pathology or digital deformities noted.  Skin  normotropic skin with no porokeratosis noted bilaterally.  No signs of infections or ulcers noted.   Asymptomatic callus sub 5th  Left.  Onychomycosis  Pain in right toes  Pain in left toes  Consent was obtained for treatment procedures.   Mechanical debridement of nails 1-5  bilaterally performed with a nail nipper.  Filed with dremel without incident. .   .     Return office visit   3 months                  Told patient to return for periodic foot care and evaluation due to potential at risk complications.   Tahlor Berenguer DPM  

## 2021-04-06 ENCOUNTER — Other Ambulatory Visit: Payer: Self-pay

## 2021-04-06 ENCOUNTER — Ambulatory Visit (INDEPENDENT_AMBULATORY_CARE_PROVIDER_SITE_OTHER): Payer: Medicare Other | Admitting: Dermatology

## 2021-04-06 DIAGNOSIS — L578 Other skin changes due to chronic exposure to nonionizing radiation: Secondary | ICD-10-CM | POA: Diagnosis not present

## 2021-04-06 DIAGNOSIS — L720 Epidermal cyst: Secondary | ICD-10-CM | POA: Diagnosis not present

## 2021-04-06 DIAGNOSIS — L570 Actinic keratosis: Secondary | ICD-10-CM | POA: Diagnosis not present

## 2021-04-06 DIAGNOSIS — L2089 Other atopic dermatitis: Secondary | ICD-10-CM | POA: Diagnosis not present

## 2021-04-06 MED ORDER — MOMETASONE FUROATE 0.1 % EX CREA
TOPICAL_CREAM | CUTANEOUS | 1 refills | Status: DC
Start: 2021-04-06 — End: 2022-12-28

## 2021-04-06 NOTE — Patient Instructions (Addendum)
If you have any questions or concerns for your doctor, please call our main line at [phone] and press option 4 to reach your doctor's medical assistant. If no one answers, please leave a voicemail as directed and we will return your call as soon as possible. Messages left after 4 pm will be answered the following business day.   You may also send [place] a message via [place]. We typically respond to MyChart messages within 1-2 business days.  For prescription refills, please ask your pharmacy to contact our office. Our fax number is [phone].  If you have an urgent issue when the clinic is closed that cannot wait until the next business day, you can page your doctor at the number below.    Please note that while we do our best to be available for urgent issues outside of office hours, we are not available 24/7.   If you have an urgent issue and are unable to reach [place], you may choose to seek medical care at your doctor's office, retail clinic, urgent care center, or emergency room.  If you have a medical emergency, please immediately call 911 or go to the emergency department.  Pager Numbers  - Dr. Nehemiah Massed: [phone]  - Dr. Laurence Ferrari: [phone]  - Dr. Nicole Kindred: [phone]  In the event of inclement weather, please call our main line at [phone] for an update on the status of any delays or closures.  Dermatology Medication Tips: Please keep the boxes that topical medications come in in order to help keep track of the instructions about where and how to use these. Pharmacies typically print the medication instructions only on the boxes and not directly on the medication tubes.   If your medication is too expensive, please contact our office at [phone] option 4 or send [place] a message through [place].   We are unable to tell what your co-pay for medications will be in advance as this is different depending on your insurance coverage. However, we may be able to find a substitute  medication at lower cost or fill out paperwork to get insurance to cover a needed medication.   If a prior authorization is required to get your medication covered by your insurance company, please allow [place] 1-2 business days to complete this process.  Drug prices often vary depending on where the prescription is filled and some pharmacies may offer cheaper prices.  The website www.goodrx.com contains coupons for medications through different pharmacies. The prices here do not account for what the cost may be with help from insurance (it may be cheaper with your insurance), but the website can give you the price if you did not use any insurance.  - You can print the associated coupon and take it with your prescription to the pharmacy.  - You may also stop by our office during regular business hours and pick up a GoodRx coupon card.  - If you need your prescription sent electronically to a different pharmacy, notify our office through [facility] or by phone at [phone] option 4.  Cryotherapy Aftercare  . Wash gently with soap and water everyday.   Marland Kitchen Apply Vaseline and Band-Aid daily until healed.    Topical steroids (such as triamcinolone, fluocinolone, fluocinonide, mometasone, clobetasol, halobetasol, betamethasone, hydrocortisone) can cause thinning and lightening of the skin if they are used for too long in the same area. Your physician has selected the right strength medicine for your problem and area affected on the body. Please use your  medication only as directed by your physician to prevent side effects.

## 2021-04-06 NOTE — Progress Notes (Signed)
   Follow-Up Visit   Subjective  [name] is a 87 y.o. male who presents for the following: Follow-up (Biopsy proven Ak on the midline chin 02/01/21 pt here for treatment ).  The following portions of the chart were reviewed this encounter and updated as appropriate:   Tobacco  Allergies  Meds  Problems  Med Hx  Surg Hx  Fam Hx     Review of Systems:  No other skin or systemic complaints except as noted in HPI or Assessment and Plan.  Objective  Well appearing patient in no apparent distress; mood and affect are within normal limits.  A focused examination was performed including face, chin . Relevant physical exam findings are noted in the Assessment and Plan.  Objective  midline anterior chin x 1, face x 5 (6): Erythematous thin papules/macules with gritty scale.   Objective  abdomen: Scaly erythematous papules and patches +/- dyspigmentation, lichenification, excoriations.   Objective  right posterior neck: 1.5 cm Subcutaneous nodule.    Assessment & Plan  AK (actinic keratosis) (6) midline anterior chin x 1, face x 5 Biopsy proven AK   Destruction of lesion - midline anterior chin x 1, face x 5 Complexity: simple   Destruction method: cryotherapy   Informed consent: discussed and consent obtained   Timeout:  patient name, date of birth, surgical site, and procedure verified Lesion destroyed using liquid nitrogen: Yes   Region frozen until ice ball extended beyond lesion: Yes   Outcome: patient tolerated procedure well with no complications   Post-procedure details: wound care instructions given    Other atopic dermatitis abdomen Mometasone twice daily until clear.  Use up to 2 weeks.  If persistent after 2 weeks use 5 days/week as needed.  Atopic dermatitis (eczema) is a chronic, relapsing, pruritic condition that can significantly affect quality of life. It is often associated with allergic rhinitis and/or asthma and can require treatment with  topical medications, phototherapy, or in severe cases a biologic medication called Dupixent in older children and adults.    Ordered Medications: mometasone (ELOCON) 0.1 % cream  Epidermal cyst right posterior neck Benign-appearing.  Observation.   Discussed surgical option.   Recommend daily use of broad spectrum spf 30+ sunscreen to sun-exposed areas.    Actinic Damage - chronic, secondary to cumulative UV radiation exposure/sun exposure over time - diffuse scaly erythematous macules with underlying dyspigmentation - Recommend daily broad spectrum sunscreen SPF 30+ to sun-exposed areas, reapply every 2 hours as needed.  - Recommend staying in the shade or wearing long sleeves, sun glasses (UVA+UVB protection) and wide brim hats (4-inch brim around the entire circumference of the hat). - Call for new or changing lesions.  Return in about 6 months (around 10/06/2021) for Aks, TBSE .  IMarye Round, CMA, am acting as scribe for Sarina Ser, MD .  Documentation: I have reviewed the above documentation for accuracy and completeness, and I agree with the above.  Sarina Ser, MD

## 2021-04-07 ENCOUNTER — Encounter: Payer: Self-pay | Admitting: Dermatology

## 2021-04-18 ENCOUNTER — Other Ambulatory Visit: Payer: Self-pay | Admitting: Internal Medicine

## 2021-04-18 DIAGNOSIS — E119 Type 2 diabetes mellitus without complications: Secondary | ICD-10-CM

## 2021-04-19 ENCOUNTER — Other Ambulatory Visit: Payer: Self-pay

## 2021-04-19 ENCOUNTER — Ambulatory Visit (INDEPENDENT_AMBULATORY_CARE_PROVIDER_SITE_OTHER): Payer: Medicare Other

## 2021-04-19 DIAGNOSIS — E538 Deficiency of other specified B group vitamins: Secondary | ICD-10-CM | POA: Diagnosis not present

## 2021-04-19 MED ORDER — CYANOCOBALAMIN 1000 MCG/ML IJ SOLN
1000.0000 ug | Freq: Once | INTRAMUSCULAR | Status: AC
  Administered 2021-04-19: 1000 ug via INTRAMUSCULAR

## 2021-04-19 NOTE — Progress Notes (Signed)
Patient presented for B 12 injection to left deltoid, patient voiced no concerns nor showed any signs of distress during injection. 

## 2021-05-10 ENCOUNTER — Other Ambulatory Visit: Payer: Medicare Other

## 2021-05-10 ENCOUNTER — Other Ambulatory Visit: Payer: Self-pay | Admitting: Internal Medicine

## 2021-05-10 ENCOUNTER — Other Ambulatory Visit (INDEPENDENT_AMBULATORY_CARE_PROVIDER_SITE_OTHER): Payer: Medicare Other

## 2021-05-10 ENCOUNTER — Other Ambulatory Visit: Payer: Self-pay

## 2021-05-10 DIAGNOSIS — E1165 Type 2 diabetes mellitus with hyperglycemia: Secondary | ICD-10-CM | POA: Diagnosis not present

## 2021-05-10 DIAGNOSIS — I10 Essential (primary) hypertension: Secondary | ICD-10-CM

## 2021-05-10 DIAGNOSIS — G7000 Myasthenia gravis without (acute) exacerbation: Secondary | ICD-10-CM

## 2021-05-10 DIAGNOSIS — E7800 Pure hypercholesterolemia, unspecified: Secondary | ICD-10-CM | POA: Diagnosis not present

## 2021-05-10 LAB — BASIC METABOLIC PANEL
BUN: 13 mg/dL (ref 6–23)
CO2: 28 mEq/L (ref 19–32)
Calcium: 9.7 mg/dL (ref 8.4–10.5)
Chloride: 99 mEq/L (ref 96–112)
Creatinine, Ser: 0.64 mg/dL (ref 0.40–1.50)
GFR: 82.12 mL/min (ref >60.00)
Glucose, Bld: 142 mg/dL — ABNORMAL HIGH (ref 70–99)
Potassium: 4.7 mEq/L (ref 3.5–5.1)
Sodium: 135 mEq/L (ref 135–145)

## 2021-05-10 LAB — CBC WITH DIFFERENTIAL/PLATELET
Basophils Absolute: 0.1 10*3/uL (ref 0.0–0.1)
Basophils Relative: 0.9 % (ref 0.0–3.0)
Eosinophils Absolute: 0.1 10*3/uL (ref 0.0–0.7)
Eosinophils Relative: 2.1 % (ref 0.0–5.0)
HCT: 38 % — ABNORMAL LOW (ref 39.0–52.0)
Hemoglobin: 12.7 g/dL — ABNORMAL LOW (ref 13.0–17.0)
Lymphocytes Relative: 18.1 % (ref 12.0–46.0)
Lymphs Abs: 1.3 10*3/uL (ref 0.7–4.0)
MCHC: 33.5 g/dL (ref 30.0–36.0)
MCV: 92.2 fl (ref 78.0–100.0)
Monocytes Absolute: 0.6 10*3/uL (ref 0.1–1.0)
Monocytes Relative: 8.1 % (ref 3.0–12.0)
Neutro Abs: 5 10*3/uL (ref 1.4–7.7)
Neutrophils Relative %: 70.8 % (ref 43.0–77.0)
Platelets: 338 10*3/uL (ref 150.0–400.0)
RBC: 4.12 Mil/uL — ABNORMAL LOW (ref 4.22–5.81)
RDW: 13.6 % (ref 11.5–15.5)
WBC: 7 10*3/uL (ref 4.0–10.5)

## 2021-05-10 LAB — LIPID PANEL
Cholesterol: 138 mg/dL (ref 0–200)
HDL: 45.9 mg/dL (ref >39.00)
LDL Cholesterol: 68 mg/dL (ref 0–99)
NonHDL: 91.83
Total CHOL/HDL Ratio: 3
Triglycerides: 121 mg/dL (ref 0.0–149.0)
VLDL: 24.2 mg/dL (ref 0.0–40.0)

## 2021-05-10 LAB — HEPATIC FUNCTION PANEL
ALT: 13 U/L (ref 0–53)
AST: 15 U/L (ref 0–37)
Albumin: 4 g/dL (ref 3.5–5.2)
Alkaline Phosphatase: 39 U/L (ref 39–117)
Bilirubin, Direct: 0.2 mg/dL (ref 0.0–0.3)
Total Bilirubin: 1 mg/dL (ref 0.2–1.2)
Total Protein: 7.2 g/dL (ref 6.0–8.3)

## 2021-05-10 LAB — HEMOGLOBIN A1C: Hgb A1c MFr Bld: 7.4 % — ABNORMAL HIGH (ref 4.6–6.5)

## 2021-05-16 ENCOUNTER — Other Ambulatory Visit: Payer: Self-pay | Admitting: Internal Medicine

## 2021-05-16 ENCOUNTER — Encounter: Payer: Self-pay | Admitting: Internal Medicine

## 2021-05-16 ENCOUNTER — Ambulatory Visit (INDEPENDENT_AMBULATORY_CARE_PROVIDER_SITE_OTHER): Payer: Medicare Other | Admitting: Internal Medicine

## 2021-05-16 ENCOUNTER — Other Ambulatory Visit: Payer: Self-pay

## 2021-05-16 VITALS — BP 130/64 | HR 74 | Temp 97.6°F | Ht 69.0 in | Wt 224.2 lb

## 2021-05-16 DIAGNOSIS — E7800 Pure hypercholesterolemia, unspecified: Secondary | ICD-10-CM | POA: Diagnosis not present

## 2021-05-16 DIAGNOSIS — G7000 Myasthenia gravis without (acute) exacerbation: Secondary | ICD-10-CM | POA: Diagnosis not present

## 2021-05-16 DIAGNOSIS — E538 Deficiency of other specified B group vitamins: Secondary | ICD-10-CM

## 2021-05-16 DIAGNOSIS — E1165 Type 2 diabetes mellitus with hyperglycemia: Secondary | ICD-10-CM | POA: Diagnosis not present

## 2021-05-16 DIAGNOSIS — E871 Hypo-osmolality and hyponatremia: Secondary | ICD-10-CM

## 2021-05-16 DIAGNOSIS — I10 Essential (primary) hypertension: Secondary | ICD-10-CM | POA: Diagnosis not present

## 2021-05-16 DIAGNOSIS — D649 Anemia, unspecified: Secondary | ICD-10-CM

## 2021-05-16 DIAGNOSIS — G479 Sleep disorder, unspecified: Secondary | ICD-10-CM | POA: Diagnosis not present

## 2021-05-16 MED ORDER — CYANOCOBALAMIN 1000 MCG/ML IJ SOLN
1000.0000 ug | Freq: Once | INTRAMUSCULAR | Status: AC
  Administered 2021-05-16: 1000 ug via INTRAMUSCULAR

## 2021-05-16 NOTE — Patient Instructions (Signed)
Stop tylenol pm.   Start melatonin 3mg  - one tablet 2-3 hours before bed.

## 2021-05-16 NOTE — Progress Notes (Signed)
Patient ID: [name], male   DOB: 04/01/1926, 87 y.o.   MRN: [id number]   Subjective:    Patient ID: [name], male    DOB: 1926/05/11, 87 y.o.   MRN: [id number]  HPI This visit occurred during the SARS-CoV-2 public health emergency.  Safety protocols were in place, including screening questions prior to the visit, additional usage of staff PPE, and extensive cleaning of exam room while observing appropriate contact time as indicated for disinfecting solutions.   Patient with past history of diabetes, hypertension and hypercholesterolemia.  Also history of Myasthenia Gravis.  Comes in today to follow up on these issues.  Still taking cellcept for his myasthenia.  Stable.  No chest pain.  Breathing stable.  No acid reflux reported.  No abdominal pain.  Bowels moving.  Some trouble sleeping. Discussed melatonin.  Taking iron three days per week.  Blood pressure doing well.  Sugars ok.  Trouble getting test strips.    Past Medical History:  Diagnosis Date   Actinic keratosis 02/01/2021   midline anterior chin    Allergic rhinitis    Basal cell carcinoma 01/29/2007   Right mid forehead. Excised: 03/24/2007, margins free.   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 Excised 07/27/2014, residual focus AFX, margins free.   Squamous cell carcinoma of skin 04/01/2007   Right forehead, 2cm above mid brow. WD SCC wtih superficial infiltration. Excised: 05/14/2007, margins free   Squamous cell carcinoma of skin 05/16/2016   Crown. WD SCC, ulcerated. Excised: 07/03/2016,  residual MD SCC, deep margin involved. Excised: 07/17/2016, margins free.   TIA (transient ischemic attack) 09/13/2015   Urinary outflow obstruction    mild   Past Surgical History:  Procedure Laterality Date   PARTIAL HIP ARTHROPLASTY  01/2019   PILONIDAL CYST EXCISION  1951   removal   ROTATOR CUFF REPAIR  1995   SKIN CANCER EXCISION     multiple   SQUAMOUS CELL CARCINOMA EXCISION     behind head   TONSILLECTOMY AND ADENOIDECTOMY  1938   Family History  Problem Relation Age of Onset   Heart disease Father        heart atack   Alzheimer's disease Mother    CVA Brother    Social History   Socioeconomic History   Marital status: Married    Spouse name: Not on file   Number of children: Not on file   Years of education: Not on file   Highest education level: Not on file  Occupational History   Not on file  Tobacco Use   Smoking status: Never   Smokeless tobacco: Never  Substance and Sexual Activity   Alcohol use: No    Alcohol/week: 0.0 standard drinks   Drug use: No   Sexual activity: Not Currently    Birth control/protection: None  Other Topics Concern   Not on file  Social History Narrative   Retired, married. Walks everyday         Social Determinants of Health   Financial Resource Strain:  Not on file  Food Insecurity: Not on file  Transportation Needs: Not on file  Physical Activity: Not on file  Stress: Not on file  Social Connections: Not on file    Review of Systems  Constitutional:  Negative for appetite change and unexpected weight change.  HENT:  Negative for congestion, sinus pressure and sore throat.   Eyes:  Negative for pain and visual disturbance.  Respiratory:  Negative for cough and chest tightness.        Breathing stable.    Cardiovascular:  Negative for chest pain and palpitations.  Gastrointestinal:  Negative for abdominal pain, diarrhea, nausea and vomiting.  Genitourinary:  Negative for difficulty urinating and dysuria.   Musculoskeletal:  Negative for joint swelling and myalgias.  Skin:  Negative for color change and rash.  Neurological:  Negative for dizziness, light-headedness and headaches.  Hematological:  Negative for adenopathy. Does not bruise/bleed easily.  Psychiatric/Behavioral:  Negative for agitation and dysphoric mood.       Objective:    Physical Exam Constitutional:      General: He is not in acute distress.    Appearance: Normal appearance. He is well-developed.  HENT:     Head: Normocephalic and atraumatic.     Right Ear: External ear normal.     Left Ear: External ear normal.  Eyes:     General: No scleral icterus.       Right eye: No discharge.        Left eye: No discharge.     Conjunctiva/sclera: Conjunctivae normal.  Neck:     Thyroid: No thyromegaly.  Cardiovascular:     Rate and Rhythm: Normal rate and regular rhythm.  Pulmonary:     Effort: No respiratory distress.     Breath sounds: Normal breath sounds. No wheezing.  Abdominal:     General: Bowel sounds are normal.     Palpations: Abdomen is soft.     Tenderness: There is no abdominal tenderness.  Musculoskeletal:        General: No swelling or tenderness.     Cervical back: Neck supple. No tenderness.  Lymphadenopathy:     Cervical: No cervical adenopathy.  Skin:    Findings: No erythema or rash.  Neurological:     Mental Status: He is alert and oriented to person, place, and time.  Psychiatric:        Mood and Affect: Mood normal.        Behavior: Behavior normal.    BP 130/64   Pulse 74   Temp 97.6 F (36.4 C) (Oral)   Ht _0  (1.753 m)   Wt 224 lb 3.2 oz (101.7 kg)   SpO2 97%   BMI 33.11 kg/m  Wt Readings from Last 3 Encounters:  05/16/21 224 lb 3.2 oz (101.7 kg)  01/13/21 223 lb (101.2 kg)  10/14/20 225 lb 3.2 oz (102.2 kg)    Outpatient Encounter Medications as of 05/16/2021  Medication Sig   Accu-Chek FastClix Lancets MISC USE 1  TO CHECK SUGARS ONCE DAILY. Dx E11.9   amLODipine  (NORVASC) 5 MG tablet TAKE 1 TABLET(5 MG) BY MOUTH DAILY   aspirin 81 MG EC tablet Take 81 mg by mouth daily.   calcium-vitamin D (OSCAL WITH D) 500-200 MG-UNIT tablet Take 1 tablet by mouth 2 (two) times daily.   ferrous sulfate 325 (65 FE) MG EC tablet Take 325 mg by mouth 3 (three) times daily with meals.   finasteride (PROSCAR) 5 MG tablet TAKE  1 TABLET(5 MG) BY MOUTH DAILY   glucose blood (ACCU-CHEK COMPACT PLUS) test strip 1 each by Other route as needed for other. DX: E11.9   lovastatin (MEVACOR) 20 MG tablet TAKE 1 TABLET(20 MG) BY MOUTH AT BEDTIME   metFORMIN (GLUCOPHAGE-XR) 500 MG 24 hr tablet TAKE 1 TABLET BY MOUTH EVERY DAY WITH BREAKFAST   mometasone (ELOCON) 0.1 % cream Apply to rash on abdomen once or twice a day prn   mycophenolate (CELLCEPT) 500 MG tablet Take 1,000 mg by mouth 2 (two) times daily.   olmesartan (BENICAR) 40 MG tablet TAKE 1 TABLET(40 MG) BY MOUTH DAILY   tamsulosin (FLOMAX) 0.4 MG CAPS capsule TAKE 1 CAPSULE(0.4 MG) BY MOUTH DAILY AFTER SUPPER   [DISCONTINUED] furosemide (LASIX) 20 MG tablet Take 1 tablet (20 mg total) by mouth daily.   [EXPIRED] cyanocobalamin ((VITAMIN B-12)) injection 1,000 mcg    No facility-administered encounter medications on file as of 05/16/2021.     Lab Results  Component Value Date   WBC 7.0 05/10/2021   HGB 12.7 (L) 05/10/2021   HCT 38.0 (L) 05/10/2021   PLT 338.0 05/10/2021   GLUCOSE 142 (H) 05/10/2021   CHOL 138 05/10/2021   TRIG 121.0 05/10/2021   HDL 45.90 05/10/2021   LDLCALC 68 05/10/2021   ALT 13 05/10/2021   AST 15 05/10/2021   NA 135 05/10/2021   K 4.7 05/10/2021   CL 99 05/10/2021   CREATININE 0.64 05/10/2021   BUN 13 05/10/2021   CO2 28 05/10/2021   TSH 2.31 08/01/2020   PSA 0.02 (L) 04/16/2018   HGBA1C 7.4 (H) 05/10/2021   MICROALBUR 43.4 (H) 01/11/2021    MR LUMBAR SPINE WO CONTRAST  Addendum Date: 05/01/2018   ADDENDUM REPORT: 05/01/2018 17:12 ADDENDUM: Chronic T12, L2, L3, and L5 compression  fractures. Electronically Signed   By: Logan Bores M.D.   On: 05/01/2018 17:12   Result Date: 05/01/2018 CLINICAL DATA:  Lumbar radiculopathy. Low back and left leg pain for couple of months. EXAM: MRI LUMBAR SPINE WITHOUT CONTRAST TECHNIQUE: Multiplanar, multisequence MR imaging of the lumbar spine was performed. No intravenous contrast was administered. COMPARISON:  None. FINDINGS: Segmentation:  Standard. Alignment: Mild lumbar levoscoliosis. 3 mm anterolisthesis of L3 on L4. Vertebrae: T12, L2, L3, and L5 compression fractures with mild-to-moderate vertebral body height loss and no significant marrow edema. 3 mm retropulsion of the posterosuperior T12 vertebral body. Mild periarticular marrow and soft tissue edema associated with left L5-S1 facet arthritis. Conus medullaris and cauda equina: Conus extends to the lower L1 level. Conus and cauda equina appear normal. Paraspinal and other soft tissues: Mild left lower lumbar posterior paraspinal muscle, possibly strain. Disc levels: Disc desiccation throughout the lower thoracic and lumbar spine. Mild disc space narrowing at L2-3 and L4-5. T11-12: Mild T12 superior endplate retropulsion, mild disc bulging, and mild facet arthrosis result in borderline to mild spinal stenosis without neural foraminal stenosis. T12-L1: Mild disc bulging and moderate facet hypertrophy without stenosis. L1-2: Mild circumferential disc bulging and moderate facet hypertrophy result in minimal right neural foraminal narrowing without spinal stenosis. L2-3: Circumferential disc bulging, a broad right subarticular to foraminal disc protrusion, and moderate facet hypertrophy result in minimal right lateral recess narrowing without spinal or neural foraminal stenosis. L3-4: Anterolisthesis with slight bulging of uncovered disc, mildly prominent dorsal epidural fat, and severe facet hypertrophy result in mild-to-moderate spinal stenosis, mild bilateral lateral recess stenosis, and mild  bilateral neural foraminal stenosis. There is likely facet ankylosis bilaterally. L4-5:  Disc bulging, mild ligamentum flavum hypertrophy, and moderate left greater than right facet hypertrophy result in mild spinal stenosis, mild-to-moderate left greater than right lateral recess stenosis, and minimal bilateral neural foraminal stenosis. L5-S1: Disc bulging and severe left greater than right facet arthrosis result in mild left lateral recess stenosis and mild right and severe left neural foraminal stenosis with potential left L5 nerve root impingement. There is a left facet joint effusion, and a 6 x 3 mm synovial cyst is present anterior to the left facet joint in close proximity to the exiting left L5 nerve at the lateral aspect of the foramen though without frank nerve compression. IMPRESSION: 1. Severe left L5-S1 facet arthritis with associated joint effusion and edema. Severe left neural foraminal stenosis with potential left L5 nerve root impingement. 2. Mild-to-moderate spinal and lateral recess stenosis at L3-4 and L4-5. Electronically Signed: By: Logan Bores M.D. On: 05/01/2018 13:20       Assessment & Plan:   Problem List Items Addressed This Visit     Anemia    Follow cbc.         Relevant Orders   CBC with Differential/Platelet   IBC + Ferritin   B12 deficiency - Primary    Continue B12 supplementation.        Diabetes mellitus ([place])    a1c on recent check 7.4.  No changes in medication.  Low carb diet and exercise.  Follow met b and a1c.        Relevant Orders   Hemoglobin F8B   Basic metabolic panel   Hypercholesterolemia    On lovastatin.  Low cholesterol diet and exercise.  Follow lipid panel and liver function tests.         Relevant Orders   Lipid panel   Hepatic function panel   HYPERTENSION, BENIGN    Continue amlodipine and benicar.  Blood pressures doing well.  Follow pressures.  Follow metabolic panel.        Relevant Orders   TSH   Hyponatremia     Off hctz. Sodium has been stable recently.  Sodium 05/10/21 - 135.  Follow.        Myasthenia gravis ([place])    Continues on cellcept.  Stable.  Follow cbc.        Sleep difficulties    Melatonin as directed.           Einar Pheasant, MD

## 2021-05-17 ENCOUNTER — Other Ambulatory Visit: Payer: Self-pay

## 2021-05-17 NOTE — Telephone Encounter (Signed)
If he has not been on the medication, then remain off and please correct med list.  Thanks.

## 2021-05-17 NOTE — Telephone Encounter (Signed)
Pt called and notified to stay off Lasix if not needed. Pt stated that he has some very minimal swelling from time to time, but nothing bothersome. I advised that in the future he call us if he does start too see more swelling or has nay changes.

## 2021-05-22 ENCOUNTER — Encounter: Payer: Self-pay | Admitting: Internal Medicine

## 2021-05-22 DIAGNOSIS — G479 Sleep disorder, unspecified: Secondary | ICD-10-CM | POA: Insufficient documentation

## 2021-05-22 NOTE — Assessment & Plan Note (Signed)
On lovastatin.  Low cholesterol diet and exercise.  Follow lipid panel and liver function tests.   

## 2021-05-22 NOTE — Assessment & Plan Note (Signed)
a1c on recent check 7.4.  No changes in medication.  Low carb diet and exercise.  Follow met b and a1c.

## 2021-05-22 NOTE — Assessment & Plan Note (Signed)
Follow cbc.  

## 2021-05-22 NOTE — Assessment & Plan Note (Signed)
-  Continue B12 supplementation °

## 2021-05-22 NOTE — Assessment & Plan Note (Signed)
Continue amlodipine and benicar.  Blood pressures doing well.  Follow pressures.  Follow metabolic panel.

## 2021-05-22 NOTE — Assessment & Plan Note (Signed)
Continues on cellcept.  Stable.  Follow cbc.

## 2021-05-22 NOTE — Assessment & Plan Note (Signed)
Off hctz. Sodium has been stable recently.  Sodium 05/10/21 - 135.  Follow.

## 2021-05-22 NOTE — Assessment & Plan Note (Signed)
Melatonin as directed.

## 2021-06-05 DIAGNOSIS — Z5181 Encounter for therapeutic drug level monitoring: Secondary | ICD-10-CM | POA: Diagnosis not present

## 2021-06-05 DIAGNOSIS — G7000 Myasthenia gravis without (acute) exacerbation: Secondary | ICD-10-CM | POA: Diagnosis not present

## 2021-06-12 ENCOUNTER — Ambulatory Visit: Payer: Medicare Other | Admitting: Dermatology

## 2021-06-16 ENCOUNTER — Other Ambulatory Visit: Payer: Self-pay

## 2021-06-16 ENCOUNTER — Ambulatory Visit (INDEPENDENT_AMBULATORY_CARE_PROVIDER_SITE_OTHER): Payer: Medicare Other

## 2021-06-16 DIAGNOSIS — E538 Deficiency of other specified B group vitamins: Secondary | ICD-10-CM

## 2021-06-16 MED ORDER — CYANOCOBALAMIN 1000 MCG/ML IJ SOLN
1000.0000 ug | Freq: Once | INTRAMUSCULAR | Status: AC
  Administered 2021-06-16: 1000 ug via INTRAMUSCULAR

## 2021-06-16 NOTE — Progress Notes (Signed)
Patient presented for B 12 injection to right deltoid, patient voiced no concerns nor showed any signs of distress during injection. 

## 2021-06-19 ENCOUNTER — Encounter: Payer: Self-pay | Admitting: Podiatry

## 2021-06-19 ENCOUNTER — Other Ambulatory Visit: Payer: Self-pay

## 2021-06-19 ENCOUNTER — Ambulatory Visit (INDEPENDENT_AMBULATORY_CARE_PROVIDER_SITE_OTHER): Payer: Medicare Other | Admitting: Podiatry

## 2021-06-19 DIAGNOSIS — E1142 Type 2 diabetes mellitus with diabetic polyneuropathy: Secondary | ICD-10-CM

## 2021-06-19 DIAGNOSIS — Q828 Other specified congenital malformations of skin: Secondary | ICD-10-CM

## 2021-06-19 DIAGNOSIS — M79674 Pain in right toe(s): Secondary | ICD-10-CM

## 2021-06-19 DIAGNOSIS — B351 Tinea unguium: Secondary | ICD-10-CM | POA: Diagnosis not present

## 2021-06-19 DIAGNOSIS — M79675 Pain in left toe(s): Secondary | ICD-10-CM | POA: Diagnosis not present

## 2021-06-19 NOTE — Progress Notes (Signed)
This patient returns to my office for at risk foot care.  This patient requires this care by a professional since this patient will be at risk due to having type 2 diabetes.    This patient is unable to cut nails himself since the patient cannot reach his nails.These nails are painful walking and wearing shoes.   This patient presents for at risk foot care today.  General Appearance  Alert, conversant and in no acute stress.  Vascular  Dorsalis pedis and posterior tibial  pulses are weakly  palpable  bilaterally.  Capillary return is within normal limits  bilaterally. Temperature is within normal limits  bilaterally.  Neurologic  Senn-Weinstein monofilament wire test within normal limits  bilaterally. Muscle power within normal limits bilaterally.  Nails Thick disfigured discolored nails with subungual debris  from hallux to fifth toes bilaterally. No evidence of bacterial infection or drainage bilaterally.  Orthopedic  No limitations of motion  feet .  No crepitus or effusions noted.  No bony pathology or digital deformities noted.  Skin  normotropic skin with no porokeratosis noted bilaterally.  No signs of infections or ulcers noted.   Symptomatic callus sub 5th  Left.  Onychomycosis  Pain in right toes  Pain in left toes  Consent was obtained for treatment procedures.   Mechanical debridement of nails 1-5  bilaterally performed with a nail nipper.  Filed with dremel without incident. .  Debride porokeratosis with # 15 blade. .     Return office visit   3 months                  Told patient to return for periodic foot care and evaluation due to potential at risk complications.   Gardiner Barefoot DPM

## 2021-06-29 ENCOUNTER — Ambulatory Visit: Payer: Medicare Other | Admitting: Podiatry

## 2021-07-05 ENCOUNTER — Encounter: Payer: Self-pay | Admitting: Internal Medicine

## 2021-07-06 NOTE — Telephone Encounter (Signed)
I spoke with [place] they stated that they were waiting on insurance. It appears it is something in their end & some documentation from them. Asa Lente tech at [employer] asked that I call back after 10 when manager comes in to see if there was anything that could be done for patient.  I have called patient to let him know that we were working on this & I would call back [place] after 10.

## 2021-07-08 ENCOUNTER — Other Ambulatory Visit: Payer: Self-pay | Admitting: Internal Medicine

## 2021-07-08 DIAGNOSIS — E119 Type 2 diabetes mellitus without complications: Secondary | ICD-10-CM

## 2021-07-13 NOTE — Telephone Encounter (Signed)
I called and spoke with [name] they have faxed Medicare form to our back fax. I was finally able to receive & I printed last OV notes from 7/12 to fax to them. I have faxed back & hopefully will satisfy Medicare guidelines. I called and let patient know this & he said that he knew this also may take a few days for them to receive so he can be approved. Pt okay with this. I told him I hope that this fixed the issue & he could get his strips for the next  3 months successfully. He will let us know if any issues.

## 2021-07-18 ENCOUNTER — Ambulatory Visit (INDEPENDENT_AMBULATORY_CARE_PROVIDER_SITE_OTHER): Payer: Medicare Other

## 2021-07-18 ENCOUNTER — Other Ambulatory Visit: Payer: Self-pay

## 2021-07-18 DIAGNOSIS — E538 Deficiency of other specified B group vitamins: Secondary | ICD-10-CM | POA: Diagnosis not present

## 2021-07-18 MED ORDER — CYANOCOBALAMIN 1000 MCG/ML IJ SOLN
1000.0000 ug | Freq: Once | INTRAMUSCULAR | Status: AC
  Administered 2021-07-18: 1000 ug via INTRAMUSCULAR

## 2021-07-18 NOTE — Progress Notes (Signed)
Patient presented for B 12 injection to right deltoid, patient voiced no concerns nor showed any signs of distress during injection. 

## 2021-07-24 ENCOUNTER — Other Ambulatory Visit: Payer: Self-pay

## 2021-07-24 MED ORDER — ACCU-CHEK COMPACT PLUS VI STRP: ORAL_STRIP | 11 refills | Status: DC

## 2021-07-30 ENCOUNTER — Other Ambulatory Visit: Payer: Self-pay | Admitting: Internal Medicine

## 2021-07-30 DIAGNOSIS — I10 Essential (primary) hypertension: Secondary | ICD-10-CM

## 2021-08-16 ENCOUNTER — Encounter: Payer: Self-pay | Admitting: Internal Medicine

## 2021-08-17 ENCOUNTER — Telehealth: Payer: Self-pay | Admitting: Internal Medicine

## 2021-08-17 NOTE — Telephone Encounter (Signed)
Completed and placed out for you to sign. He is picking up tomorrow.

## 2021-08-17 NOTE — Telephone Encounter (Signed)
Signed and placed in box.   

## 2021-08-17 NOTE — Telephone Encounter (Signed)
Patient dropped off handicapp renewal form. Form is up front and will pick up tomorrow morning when he comes in for his injections.

## 2021-08-18 ENCOUNTER — Other Ambulatory Visit: Payer: Self-pay

## 2021-08-18 ENCOUNTER — Ambulatory Visit (INDEPENDENT_AMBULATORY_CARE_PROVIDER_SITE_OTHER): Payer: Medicare Other

## 2021-08-18 DIAGNOSIS — E538 Deficiency of other specified B group vitamins: Secondary | ICD-10-CM

## 2021-08-18 MED ORDER — CYANOCOBALAMIN 1000 MCG/ML IJ SOLN
1000.0000 ug | Freq: Once | INTRAMUSCULAR | Status: AC
  Administered 2021-08-18: 1000 ug via INTRAMUSCULAR

## 2021-08-18 NOTE — Progress Notes (Signed)
Patient presented for B 12 injection to left deltoid, patient voiced no concerns nor showed any signs of distress during injection   Shantoya Geurts,cma   

## 2021-08-18 NOTE — Telephone Encounter (Signed)
Placed up front for patient.

## 2021-08-31 NOTE — Progress Notes (Signed)
Cardiology Office Note  Date:  09/01/2021   ID:  CREEK GAN, DOB 1926/01/06, MRN [id number]  PCP:  Einar Pheasant, MD   Chief Complaint  Patient presents with   12 month follow up     "Doing well." Patient c/o LE edema. Medications reviewed by the patient verbally.     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 Trouble getting on exam take  Does not take Lasix , does not think swelling is significant Low sodium in the past  Weight higher 211 in 2020 up to 223 today "Lady comes in to clean and cook" Daughter in Sports coach bring food over [place] gives food to take home  EKG personally reviewed by myself on todays visit Nsr rate 65 bpm lafb, poor r wave progression  Other lab work reviewed HBA1c 7.8  Other PMH reviewed diagnosis of Myasthenia Gravis severe muscle weakness, arms, occular, facial had 5 treatments over 2 weeks, QOD On cellcept since Jan 2018 On today's visit reports that when he wakes up 1 of his eyelids is slow to get out of the way Through the course of the morning usually resolves and goes back to normal  Wife died 12-Nov-2017, severe Parkinson's, died  PMH:   has a past medical history of Actinic keratosis (02/01/2021), Allergic rhinitis, Basal cell carcinoma (01/29/2007), BPH (benign prostatic hypertrophy), Degenerative arthritis, Diabetes mellitus ([place]), GERD (gastroesophageal reflux disease), HTN (hypertension), basal cell carcinoma (06/29/2020),  Hypercholesterolemia, HYPERTENSION, BENIGN (08/15/2010), Iron deficiency anemia, Skin cancer (06/23/2014), Squamous cell carcinoma of skin (04/01/2007), Squamous cell carcinoma of skin (05/16/2016), TIA (transient ischemic attack) (09/13/2015), and Urinary outflow obstruction.  PSH:    Past Surgical History:  Procedure Laterality Date   PARTIAL HIP ARTHROPLASTY  01/2019   PILONIDAL CYST EXCISION  1951   removal   ROTATOR CUFF REPAIR  1995   SKIN CANCER EXCISION     multiple   SQUAMOUS CELL CARCINOMA EXCISION     behind head   TONSILLECTOMY AND ADENOIDECTOMY  1938    Current Outpatient Medications  Medication Sig Dispense Refill   Accu-Chek FastClix Lancets MISC USE 1  TO CHECK SUGARS ONCE DAILY. Dx E11.9 102 each 3   amLODipine (NORVASC) 5 MG tablet TAKE 1 TABLET(5 MG) BY MOUTH DAILY 90 tablet 1   aspirin 81 MG EC tablet Take 81 mg by mouth daily.     calcium-vitamin D (OSCAL WITH D) 500-200 MG-UNIT tablet Take 1 tablet by mouth 2 (two) times daily.     ferrous sulfate 325 (65 FE) MG EC tablet Take 325 mg by mouth 3 (three) times daily with meals.     finasteride (PROSCAR) 5 MG tablet TAKE 1 TABLET(5 MG) BY MOUTH DAILY 90 tablet 3   glucose blood (ACCU-CHEK COMPACT PLUS) test strip Use once daily as directed to check blood sugars. DX: E11.9 100 each 11   lovastatin (MEVACOR) 20 MG tablet TAKE 1 TABLET(20 MG) BY MOUTH AT BEDTIME 90 tablet 1   metFORMIN (GLUCOPHAGE-XR) 500 MG 24 hr tablet TAKE 1 TABLET BY  MOUTH EVERY DAY WITH BREAKFAST 90 tablet 0   mycophenolate (CELLCEPT) 500 MG tablet Take 1,000 mg by mouth 2 (two) times daily.     olmesartan (BENICAR) 40 MG tablet TAKE 1 TABLET(40 MG) BY MOUTH DAILY 30 tablet 2   tamsulosin (FLOMAX) 0.4 MG CAPS capsule TAKE 1 CAPSULE(0.4 MG) BY MOUTH DAILY AFTER SUPPER 90 capsule 3   mometasone (ELOCON) 0.1 % cream Apply to rash on abdomen once or twice a day prn (Patient not taking: Reported on 09/01/2021) 45 g 1   No current facility-administered  medications for this visit.    Allergies:   Magnesium and Penicillamine   Social History:  The patient  reports that he has never smoked. He has never used smokeless tobacco. He reports that he does not drink alcohol and does not use drugs.   Family History:   family history includes Alzheimer's disease in his mother; CVA in his brother; Heart disease in his father.   Review of Systems: Review of Systems  Constitutional: Negative.   Respiratory: Negative.    Cardiovascular:  Positive for leg swelling.  Gastrointestinal: Negative.   Musculoskeletal: Negative.   Neurological: Negative.   Psychiatric/Behavioral: Negative.    All other systems reviewed and are negative.  PHYSICAL EXAM: VS:  BP (!) 158/66 (BP Location: Left Arm, Patient Position: Sitting, Cuff Size: Normal)   Pulse 65   Ht 5\' 11"  (1.803 m)   Wt 223 lb 8 oz (101.4 kg)   SpO2 98%   BMI 31.17 kg/m  , BMI Body mass index is 31.17 kg/m. Constitutional:  oriented to person, place, and time. No distress.  HENT:  Head: Grossly normal Eyes:  no discharge. No scleral icterus.  Neck: No JVD, no carotid bruits  Cardiovascular: Regular rate and rhythm, no murmurs appreciated Pulmonary/Chest: Clear to auscultation bilaterally, no wheezes or rails Abdominal: Soft.  no distension.  no tenderness.  Musculoskeletal: Normal range of motion Neurological:  normal muscle tone. Coordination normal. No atrophy Skin: Skin warm and dry Psychiatric: normal affect, pleasant  Recent Labs: 05/10/2021: ALT 13; BUN 13; Creatinine, Ser 0.64; Hemoglobin 12.7; Platelets 338.0; Potassium 4.7; Sodium 135    Lipid Panel Lab Results  Component Value Date   CHOL 138 05/10/2021   HDL 45.90 05/10/2021   LDLCALC 68 05/10/2021   TRIG 121.0 05/10/2021    Wt Readings from Last 3 Encounters:  09/01/21 223 lb 8 oz (101.4 kg)  05/16/21 224 lb 3.2 oz (101.7 kg)  01/13/21 223 lb (101.2 kg)     ASSESSMENT AND  PLAN:  Hypercholesterolemia Cholesterol is at goal on the current lipid regimen. No changes to the medications were made.  HYPERTENSION, BENIGN Blood pressure is well controlled on today's visit. No changes made to the medications.  Type 2 diabetes mellitus without complication, without long-term current use of insulin (HCC) Weight stable 214, A1c trending upwards, 7.4 Eating more Lifestyle modification and dietary changes recommended  Obesity (BMI 30.0-34.9 Weight stable as above No regular exercise program  Myasthenia gravis (HCC) On CellCept No exercise, denies any progression  Hyponatremia Sodium stable 135 (off lasix)  Chronic diastolic CHF Lasix as needed Weight up from food, appear euvolemic, Would take lasix for worsening legs welling  Leg swelling  dependent edema, would benefit from compression hose Leg elevation   Total encounter time more than 25 minutes  Greater than 50% was spent in counseling and coordination of care with the patient    No orders of the defined types were  placed in this encounter.    Signed, Esmond Plants, M.D., Ph.D. 09/01/2021  [place], [place]

## 2021-09-01 ENCOUNTER — Other Ambulatory Visit: Payer: Self-pay

## 2021-09-01 ENCOUNTER — Encounter: Payer: Self-pay | Admitting: Cardiovascular Disease

## 2021-09-01 ENCOUNTER — Ambulatory Visit (INDEPENDENT_AMBULATORY_CARE_PROVIDER_SITE_OTHER): Payer: Medicare Other | Admitting: Cardiovascular Disease

## 2021-09-01 VITALS — BP 158/66 | HR 65 | Ht 71.0 in | Wt 223.5 lb

## 2021-09-01 DIAGNOSIS — E1165 Type 2 diabetes mellitus with hyperglycemia: Secondary | ICD-10-CM | POA: Diagnosis not present

## 2021-09-01 DIAGNOSIS — I10 Essential (primary) hypertension: Secondary | ICD-10-CM

## 2021-09-01 DIAGNOSIS — G459 Transient cerebral ischemic attack, unspecified: Secondary | ICD-10-CM

## 2021-09-01 DIAGNOSIS — R0602 Shortness of breath: Secondary | ICD-10-CM | POA: Diagnosis not present

## 2021-09-01 DIAGNOSIS — G7000 Myasthenia gravis without (acute) exacerbation: Secondary | ICD-10-CM

## 2021-09-01 MED ORDER — FUROSEMIDE 20 MG PO TABS: 20.0000 mg | ORAL_TABLET | Freq: Every day | ORAL | 3 refills | Status: DC | PRN

## 2021-09-01 NOTE — Patient Instructions (Addendum)
Medication Instructions:  Lasix 20 mg daily  As needed for shortness of breath, ankle swelling  If you need a refill on your cardiac medications before your next appointment, please call your pharmacy.   Lab work: No new labs needed  Testing/Procedures: No new testing needed  Follow-Up: At [facility], you and your health needs are our priority.  As part of our continuing mission to provide you with exceptional heart care, we have created designated Provider Care Teams.  These Care Teams include your primary Cardiologist (physician) and Advanced Practice Providers (APPs -  Physician Assistants and Nurse Practitioners) who all work together to provide you with the care you need, when you need it.  You will need a follow up appointment in 12 months  Providers on your designated Care Team:   Murray Hodgkins, NP Christell Faith, PA-C Cadence Kathlen Mody, [place]  COVID-19 Vaccine Information can be found at: [email or web address] For questions related to vaccine distribution or appointments, please email [email or web address] or call [phone].

## 2021-09-13 DIAGNOSIS — U071 COVID-19: Secondary | ICD-10-CM | POA: Diagnosis not present

## 2021-09-15 ENCOUNTER — Other Ambulatory Visit: Payer: Medicare Other

## 2021-09-15 ENCOUNTER — Other Ambulatory Visit: Payer: Self-pay

## 2021-09-15 ENCOUNTER — Other Ambulatory Visit (INDEPENDENT_AMBULATORY_CARE_PROVIDER_SITE_OTHER): Payer: Medicare Other

## 2021-09-15 DIAGNOSIS — D649 Anemia, unspecified: Secondary | ICD-10-CM

## 2021-09-15 DIAGNOSIS — E7800 Pure hypercholesterolemia, unspecified: Secondary | ICD-10-CM

## 2021-09-15 DIAGNOSIS — I10 Essential (primary) hypertension: Secondary | ICD-10-CM | POA: Diagnosis not present

## 2021-09-15 DIAGNOSIS — E1165 Type 2 diabetes mellitus with hyperglycemia: Secondary | ICD-10-CM | POA: Diagnosis not present

## 2021-09-15 LAB — CBC WITH DIFFERENTIAL/PLATELET
Basophils Absolute: 0.1 10*3/uL (ref 0.0–0.1)
Basophils Relative: 1.2 % (ref 0.0–3.0)
Eosinophils Absolute: 0.1 10*3/uL (ref 0.0–0.7)
Eosinophils Relative: 1.8 % (ref 0.0–5.0)
HCT: 37.5 % — ABNORMAL LOW (ref 39.0–52.0)
Hemoglobin: 12.3 g/dL — ABNORMAL LOW (ref 13.0–17.0)
Lymphocytes Relative: 15.3 % (ref 12.0–46.0)
Lymphs Abs: 1 10*3/uL (ref 0.7–4.0)
MCHC: 32.8 g/dL (ref 30.0–36.0)
MCV: 92.8 fl (ref 78.0–100.0)
Monocytes Absolute: 0.5 10*3/uL (ref 0.1–1.0)
Monocytes Relative: 6.8 % (ref 3.0–12.0)
Neutro Abs: 5.1 10*3/uL (ref 1.4–7.7)
Neutrophils Relative %: 74.9 % (ref 43.0–77.0)
Platelets: 313 10*3/uL (ref 150.0–400.0)
RBC: 4.04 Mil/uL — ABNORMAL LOW (ref 4.22–5.81)
RDW: 13.2 % (ref 11.5–15.5)
WBC: 6.7 10*3/uL (ref 4.0–10.5)

## 2021-09-15 LAB — BASIC METABOLIC PANEL
BUN: 13 mg/dL (ref 6–23)
CO2: 22 mEq/L (ref 19–32)
Calcium: 9.2 mg/dL (ref 8.4–10.5)
Chloride: 100 mEq/L (ref 96–112)
Creatinine, Ser: 0.6 mg/dL (ref 0.40–1.50)
GFR: 83.53 mL/min (ref >60.00)
Glucose, Bld: 139 mg/dL — ABNORMAL HIGH (ref 70–99)
Potassium: 4.8 mEq/L (ref 3.5–5.1)
Sodium: 135 mEq/L (ref 135–145)

## 2021-09-15 LAB — IBC + FERRITIN
Ferritin: 91.4 ng/mL (ref 22.0–322.0)
Iron: 118 ug/dL (ref 42–165)
Saturation Ratios: 41.3 % (ref 20.0–50.0)
TIBC: 285.6 ug/dL (ref 250.0–450.0)
Transferrin: 204 mg/dL — ABNORMAL LOW (ref 212.0–360.0)

## 2021-09-15 LAB — HEPATIC FUNCTION PANEL
ALT: 13 U/L (ref 0–53)
AST: 21 U/L (ref 0–37)
Albumin: 4 g/dL (ref 3.5–5.2)
Alkaline Phosphatase: 34 U/L — ABNORMAL LOW (ref 39–117)
Bilirubin, Direct: 0.2 mg/dL (ref 0.0–0.3)
Total Bilirubin: 1 mg/dL (ref 0.2–1.2)
Total Protein: 7.1 g/dL (ref 6.0–8.3)

## 2021-09-15 LAB — LIPID PANEL
Cholesterol: 126 mg/dL (ref 0–200)
HDL: 44.4 mg/dL (ref >39.00)
LDL Cholesterol: 62 mg/dL (ref 0–99)
NonHDL: 82
Total CHOL/HDL Ratio: 3
Triglycerides: 98 mg/dL (ref 0.0–149.0)
VLDL: 19.6 mg/dL (ref 0.0–40.0)

## 2021-09-15 LAB — HEMOGLOBIN A1C: Hgb A1c MFr Bld: 7.3 % — ABNORMAL HIGH (ref 4.6–6.5)

## 2021-09-15 LAB — TSH: TSH: 2.9 u[IU]/mL (ref 0.35–5.50)

## 2021-09-18 ENCOUNTER — Encounter: Payer: Self-pay | Admitting: Internal Medicine

## 2021-09-18 ENCOUNTER — Ambulatory Visit (INDEPENDENT_AMBULATORY_CARE_PROVIDER_SITE_OTHER): Payer: Medicare Other | Admitting: Internal Medicine

## 2021-09-18 ENCOUNTER — Other Ambulatory Visit: Payer: Self-pay

## 2021-09-18 VITALS — BP 150/80 | HR 73 | Temp 97.5°F | Ht 71.0 in | Wt 223.8 lb

## 2021-09-18 DIAGNOSIS — E538 Deficiency of other specified B group vitamins: Secondary | ICD-10-CM

## 2021-09-18 DIAGNOSIS — E1165 Type 2 diabetes mellitus with hyperglycemia: Secondary | ICD-10-CM

## 2021-09-18 DIAGNOSIS — E871 Hypo-osmolality and hyponatremia: Secondary | ICD-10-CM

## 2021-09-18 DIAGNOSIS — I10 Essential (primary) hypertension: Secondary | ICD-10-CM | POA: Diagnosis not present

## 2021-09-18 DIAGNOSIS — D649 Anemia, unspecified: Secondary | ICD-10-CM

## 2021-09-18 DIAGNOSIS — G7000 Myasthenia gravis without (acute) exacerbation: Secondary | ICD-10-CM | POA: Diagnosis not present

## 2021-09-18 DIAGNOSIS — E7800 Pure hypercholesterolemia, unspecified: Secondary | ICD-10-CM | POA: Diagnosis not present

## 2021-09-18 DIAGNOSIS — Z23 Encounter for immunization: Secondary | ICD-10-CM

## 2021-09-18 NOTE — Progress Notes (Signed)
Patient ID: JAAMAL Osborn, male   DOB: Apr 25, 1926, 87 y.o.   MRN: [id number]   Subjective:    Patient ID: [name], male    DOB: [birth year], 87 y.o.   MRN: [id number]  This visit occurred during the SARS-CoV-2 public health emergency.  Safety protocols were in place, including screening questions prior to the visit, additional usage of staff PPE, and extensive cleaning of exam room while observing appropriate contact time as indicated for disinfecting solutions.   Patient here for a scheduled follow up.   HPI Here to follow up regarding her blood sugar, blood pressure and cholesterol.  He is doing well.  Feels good.  No recent falls.  Uses a cane.  Also uses a walker at night.  No chest pain.  Breathing stable.  Had f/u with neurology 06/2021.  Stable.  Recommended continuing cellcept.  No acid reflux.  No abdominal pain.  Bowels moving.  States am sugars 120-130s.  Taking lasix daily.  Prescribed by cardiology two weeks ago.  Was told to take prn.  Has been taking daily.  Weight at home stable around 218.  Discussed labs.    Past Medical History:  Diagnosis Date   Actinic keratosis 02/01/2021   midline anterior chin    Allergic rhinitis    Basal cell carcinoma 01/29/2007   Right mid forehead. Excised: 03/24/2007, margins free.   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 Excised 07/27/2014, residual focus AFX, margins free.   Squamous cell carcinoma of skin 04/01/2007   Right forehead, 2cm above mid brow. WD SCC wtih superficial infiltration. Excised: 05/14/2007,  margins free   Squamous cell carcinoma of skin 05/16/2016   Crown. WD SCC, ulcerated. Excised: 07/03/2016, residual MD SCC, deep margin involved. Excised: 07/17/2016, margins free.   TIA (transient ischemic attack) 09/13/2015   Urinary outflow obstruction    mild   Past Surgical History:  Procedure Laterality Date   PARTIAL HIP ARTHROPLASTY  01/2019   PILONIDAL CYST EXCISION  1951   removal   ROTATOR CUFF REPAIR  1995   SKIN CANCER EXCISION     multiple   SQUAMOUS CELL CARCINOMA EXCISION     behind head   TONSILLECTOMY AND ADENOIDECTOMY  1938   Family History  Problem Relation Age of Onset   Heart disease Father        heart atack   Alzheimer's disease Mother    CVA Brother    Social History   Socioeconomic History   Marital status: Married    Spouse name: Not on file   Number of children: Not on file   Years of education: Not on file   Highest education level: Not on file  Occupational History   Not on file  Tobacco Use   Smoking status: Never   Smokeless tobacco: Never  Vaping Use   Vaping Use: Never used  Substance and Sexual Activity   Alcohol use: No    Alcohol/week: 0.0 standard drinks   Drug use: No   Sexual activity: Not Currently  Birth control/protection: None  Other Topics Concern   Not on file  Social History Narrative   Retired, married. Walks everyday         Social Determinants of Health   Financial Resource Strain: Not on file  Food Insecurity: Not on file  Transportation Needs: Not on file  Physical Activity: Not on file  Stress: Not on file  Social Connections: Not on file     Review of Systems  Constitutional:  Negative for appetite change and unexpected weight change.  HENT:  Negative for congestion and sinus pressure.   Respiratory:  Negative for cough and chest tightness.        Breathing stable.   Cardiovascular:  Negative for chest pain and palpitations.       No increased swelling.  Stable.   Gastrointestinal:  Negative  for abdominal pain, diarrhea, nausea and vomiting.  Genitourinary:  Negative for difficulty urinating and dysuria.  Musculoskeletal:  Negative for joint swelling and myalgias.  Skin:  Negative for color change and rash.  Neurological:  Negative for dizziness, light-headedness and headaches.  Psychiatric/Behavioral:  Negative for agitation and dysphoric mood.       Objective:     BP (!) 150/80   Pulse 73   Temp (!) 97.5 F (36.4 C) (Oral)   Ht '5\' 11"'  (1.803 m)   Wt 223 lb 12.8 oz (101.5 kg)   SpO2 98%   BMI 31.21 kg/m  Wt Readings from Last 3 Encounters:  09/18/21 223 lb 12.8 oz (101.5 kg)  09/01/21 223 lb 8 oz (101.4 kg)  05/16/21 224 lb 3.2 oz (101.7 kg)    Physical Exam Constitutional:      General: He is not in acute distress.    Appearance: Normal appearance. He is well-developed.  HENT:     Head: Normocephalic and atraumatic.     Right Ear: External ear normal.     Left Ear: External ear normal.  Eyes:     General: No scleral icterus.       Right eye: No discharge.        Left eye: No discharge.  Cardiovascular:     Rate and Rhythm: Normal rate and regular rhythm.  Pulmonary:     Effort: Pulmonary effort is normal. No respiratory distress.     Breath sounds: Normal breath sounds.  Abdominal:     General: Bowel sounds are normal.     Palpations: Abdomen is soft.     Tenderness: There is no abdominal tenderness.  Musculoskeletal:        General: No tenderness.     Cervical back: Neck supple. No tenderness.     Comments: No increased swelling.    Lymphadenopathy:     Cervical: No cervical adenopathy.  Skin:    Findings: No erythema or rash.  Neurological:     Mental Status: He is alert.  Psychiatric:        Mood and Affect: Mood normal.        Behavior: Behavior normal.     Outpatient Encounter Medications as of 09/18/2021  Medication Sig   Accu-Chek FastClix Lancets MISC USE 1  TO CHECK SUGARS ONCE DAILY. Dx E11.9   amLODipine (NORVASC) 5 MG tablet  TAKE 1 TABLET(5 MG) BY MOUTH DAILY   aspirin 81 MG EC tablet Take 81 mg by mouth daily.   calcium-vitamin D (OSCAL WITH D) 500-200 MG-UNIT tablet Take 1 tablet by mouth 2 (two) times daily.   ferrous sulfate 325 (65  FE) MG EC tablet Take 325 mg by mouth 3 (three) times daily with meals.   finasteride (PROSCAR) 5 MG tablet TAKE 1 TABLET(5 MG) BY MOUTH DAILY   furosemide (LASIX) 20 MG tablet Take 1 tablet (20 mg total) by mouth daily as needed. For shortness of breath and swelling   glucose blood (ACCU-CHEK COMPACT PLUS) test strip Use once daily as directed to check blood sugars. DX: E11.9   lovastatin (MEVACOR) 20 MG tablet TAKE 1 TABLET(20 MG) BY MOUTH AT BEDTIME   metFORMIN (GLUCOPHAGE-XR) 500 MG 24 hr tablet TAKE 1 TABLET BY MOUTH EVERY DAY WITH BREAKFAST   mometasone (ELOCON) 0.1 % cream Apply to rash on abdomen once or twice a day prn   mycophenolate (CELLCEPT) 500 MG tablet Take 1,000 mg by mouth 2 (two) times daily.   olmesartan (BENICAR) 40 MG tablet TAKE 1 TABLET(40 MG) BY MOUTH DAILY   tamsulosin (FLOMAX) 0.4 MG CAPS capsule TAKE 1 CAPSULE(0.4 MG) BY MOUTH DAILY AFTER SUPPER   No facility-administered encounter medications on file as of 09/18/2021.     Lab Results  Component Value Date   WBC 6.7 09/15/2021   HGB 12.3 (L) 09/15/2021   HCT 37.5 (L) 09/15/2021   PLT 313.0 09/15/2021   GLUCOSE 139 (H) 09/15/2021   CHOL 126 09/15/2021   TRIG 98.0 09/15/2021   HDL 44.40 09/15/2021   LDLCALC 62 09/15/2021   ALT 13 09/15/2021   AST 21 09/15/2021   NA 135 09/15/2021   K 4.8 09/15/2021   CL 100 09/15/2021   CREATININE 0.60 09/15/2021   BUN 13 09/15/2021   CO2 22 09/15/2021   TSH 2.90 09/15/2021   PSA 0.02 (L) 04/16/2018   HGBA1C 7.3 (H) 09/15/2021   MICROALBUR 43.4 (H) 01/11/2021    MR LUMBAR SPINE WO CONTRAST  Addendum Date: 05/01/2018   ADDENDUM REPORT: 05/01/2018 17:12 ADDENDUM: Chronic T12, L2, L3, and L5 compression fractures. Electronically Signed   By: Logan Bores  M.D.   On: 05/01/2018 17:12   Result Date: 05/01/2018 CLINICAL DATA:  Lumbar radiculopathy. Low back and left leg pain for couple of months. EXAM: MRI LUMBAR SPINE WITHOUT CONTRAST TECHNIQUE: Multiplanar, multisequence MR imaging of the lumbar spine was performed. No intravenous contrast was administered. COMPARISON:  None. FINDINGS: Segmentation:  Standard. Alignment: Mild lumbar levoscoliosis. 3 mm anterolisthesis of L3 on L4. Vertebrae: T12, L2, L3, and L5 compression fractures with mild-to-moderate vertebral body height loss and no significant marrow edema. 3 mm retropulsion of the posterosuperior T12 vertebral body. Mild periarticular marrow and soft tissue edema associated with left L5-S1 facet arthritis. Conus medullaris and cauda equina: Conus extends to the lower L1 level. Conus and cauda equina appear normal. Paraspinal and other soft tissues: Mild left lower lumbar posterior paraspinal muscle, possibly strain. Disc levels: Disc desiccation throughout the lower thoracic and lumbar spine. Mild disc space narrowing at L2-3 and L4-5. T11-12: Mild T12 superior endplate retropulsion, mild disc bulging, and mild facet arthrosis result in borderline to mild spinal stenosis without neural foraminal stenosis. T12-L1: Mild disc bulging and moderate facet hypertrophy without stenosis. L1-2: Mild circumferential disc bulging and moderate facet hypertrophy result in minimal right neural foraminal narrowing without spinal stenosis. L2-3: Circumferential disc bulging, a broad right subarticular to foraminal disc protrusion, and moderate facet hypertrophy result in minimal right lateral recess narrowing without spinal or neural foraminal stenosis. L3-4: Anterolisthesis with slight bulging of uncovered disc, mildly prominent dorsal epidural fat, and severe facet hypertrophy result in mild-to-moderate spinal  stenosis, mild bilateral lateral recess stenosis, and mild bilateral neural foraminal stenosis. There is likely  facet ankylosis bilaterally. L4-5: Disc bulging, mild ligamentum flavum hypertrophy, and moderate left greater than right facet hypertrophy result in mild spinal stenosis, mild-to-moderate left greater than right lateral recess stenosis, and minimal bilateral neural foraminal stenosis. L5-S1: Disc bulging and severe left greater than right facet arthrosis result in mild left lateral recess stenosis and mild right and severe left neural foraminal stenosis with potential left L5 nerve root impingement. There is a left facet joint effusion, and a 6 x 3 mm synovial cyst is present anterior to the left facet joint in close proximity to the exiting left L5 nerve at the lateral aspect of the foramen though without frank nerve compression. IMPRESSION: 1. Severe left L5-S1 facet arthritis with associated joint effusion and edema. Severe left neural foraminal stenosis with potential left L5 nerve root impingement. 2. Mild-to-moderate spinal and lateral recess stenosis at L3-4 and L4-5. Electronically Signed: By: Logan Bores M.D. On: 05/01/2018 13:20       Assessment & Plan:   Problem List Items Addressed This Visit     Anemia    Follow cbc.  Recent hgb stable.        Relevant Orders   CBC with Differential/Platelet   Ferritin   B12 deficiency    Recheck B12 level with next labs.       Relevant Orders   Vitamin B12   Diabetes mellitus ([place])    a1c on recent check 7.3.  Sugars as outlined.   No changes in medication.  Low carb diet and exercise.  Follow met b and a1c.       Relevant Orders   Basic metabolic panel   Hemoglobin A1c   Microalbumin / creatinine urine ratio   Hypercholesterolemia    On lovastatin.  Low cholesterol diet and exercise.  Follow lipid panel and liver function tests.        Relevant Orders   Hepatic function panel   Lipid panel   HYPERTENSION, BENIGN    Continue amlodipine and benicar.  Blood pressures on outside checks averaging mostly - 120-130s/70s.   Follow  pressures.  Follow metabolic panel.       Hyponatremia    Off hctz.  Recent sodium wnl.       Myasthenia gravis ([place])    Just evaluated by neurology.  Stable.  Continue cellcept.  Follow cbc.       Other Visit Diagnoses     Need for immunization against influenza    -  Primary   Relevant Orders   Flu Vaccine QUAD High Dose(Fluad) (Completed)        Einar Pheasant, MD

## 2021-09-19 ENCOUNTER — Encounter: Payer: Self-pay | Admitting: Internal Medicine

## 2021-09-19 NOTE — Assessment & Plan Note (Signed)
Follow cbc.  Recent hgb stable.

## 2021-09-19 NOTE — Assessment & Plan Note (Signed)
Recheck B12 level with next labs.  

## 2021-09-19 NOTE — Assessment & Plan Note (Addendum)
Continue amlodipine and benicar.  Blood pressures on outside checks averaging mostly - 120-130s/70s.   Follow pressures.  Follow metabolic panel.

## 2021-09-19 NOTE — Assessment & Plan Note (Signed)
Off hctz.  Recent sodium wnl.

## 2021-09-19 NOTE — Assessment & Plan Note (Signed)
Just evaluated by neurology.  Stable.  Continue cellcept.  Follow cbc.

## 2021-09-19 NOTE — Assessment & Plan Note (Signed)
a1c on recent check 7.3.  Sugars as outlined.   No changes in medication.  Low carb diet and exercise.  Follow met b and a1c.

## 2021-09-19 NOTE — Assessment & Plan Note (Signed)
On lovastatin.  Low cholesterol diet and exercise.  Follow lipid panel and liver function tests.   

## 2021-09-25 ENCOUNTER — Encounter: Payer: Self-pay | Admitting: Podiatry

## 2021-09-25 ENCOUNTER — Ambulatory Visit (INDEPENDENT_AMBULATORY_CARE_PROVIDER_SITE_OTHER): Payer: Medicare Other | Admitting: Podiatry

## 2021-09-25 ENCOUNTER — Other Ambulatory Visit: Payer: Self-pay

## 2021-09-25 DIAGNOSIS — M79675 Pain in left toe(s): Secondary | ICD-10-CM

## 2021-09-25 DIAGNOSIS — M79674 Pain in right toe(s): Secondary | ICD-10-CM | POA: Diagnosis not present

## 2021-09-25 DIAGNOSIS — Q828 Other specified congenital malformations of skin: Secondary | ICD-10-CM

## 2021-09-25 DIAGNOSIS — B351 Tinea unguium: Secondary | ICD-10-CM | POA: Diagnosis not present

## 2021-09-25 DIAGNOSIS — E1142 Type 2 diabetes mellitus with diabetic polyneuropathy: Secondary | ICD-10-CM | POA: Diagnosis not present

## 2021-09-25 NOTE — Progress Notes (Signed)
This patient returns to my office for at risk foot care.  This patient requires this care by a professional since this patient will be at risk due to having type 2 diabetes.    This patient is unable to cut nails himself since the patient cannot reach his nails.These nails are painful walking and wearing shoes.   This patient presents for at risk foot care today.  General Appearance  Alert, conversant and in no acute stress.  Vascular  Dorsalis pedis and posterior tibial  pulses are weakly  palpable  bilaterally.  Capillary return is within normal limits  bilaterally. Temperature is within normal limits  bilaterally.  Neurologic  Senn-Weinstein monofilament wire test diminished  bilaterally. Muscle power within normal limits bilaterally.  Nails Thick disfigured discolored nails with subungual debris  from hallux to fifth toes bilaterally. No evidence of bacterial infection or drainage bilaterally.  Orthopedic  No limitations of motion  feet .  No crepitus or effusions noted.  No bony pathology or digital deformities noted. Plantar flexed fifth metatarsal left foot.  Mild  HAV  Skin  normotropic skin with no porokeratosis noted bilaterally.  No signs of infections or ulcers noted.   Symptomatic callus sub 5th  Left.  Onychomycosis  Pain in right toes  Pain in left toes  Consent was obtained for treatment procedures.   Mechanical debridement of nails 1-5  bilaterally performed with a nail nipper.  Filed with dremel without incident. .  Debride porokeratosis with # 15 blade. . 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   Return office visit   3 months                  Told patient to return for periodic foot care and evaluation due to potential at risk complications.   Gardiner Barefoot DPM

## 2021-09-27 ENCOUNTER — Other Ambulatory Visit: Payer: Self-pay

## 2021-09-27 ENCOUNTER — Encounter: Payer: Self-pay | Admitting: Urology

## 2021-09-27 ENCOUNTER — Ambulatory Visit (INDEPENDENT_AMBULATORY_CARE_PROVIDER_SITE_OTHER): Payer: Medicare Other | Admitting: Urology

## 2021-09-27 VITALS — BP 144/79 | HR 72 | Ht 71.0 in | Wt 225.1 lb

## 2021-09-27 DIAGNOSIS — N401 Enlarged prostate with lower urinary tract symptoms: Secondary | ICD-10-CM | POA: Diagnosis not present

## 2021-09-27 LAB — BLADDER SCAN AMB NON-IMAGING

## 2021-09-27 MED ORDER — FINASTERIDE 5 MG PO TABS: ORAL_TABLET | ORAL | 3 refills | Status: DC

## 2021-09-27 MED ORDER — TAMSULOSIN HCL 0.4 MG PO CAPS: ORAL_CAPSULE | ORAL | 3 refills | Status: DC

## 2021-09-27 NOTE — Progress Notes (Signed)
   09/27/2021 2:55 PM   [name] February 20, 1926 [id number]  Reason for visit: Follow up BPH, history of UTI  HPI: I saw Mr. Molina in urology clinic for the above issues.  He is a 87 year old male with a long history of urinary symptoms well controlled on Flomax and finasteride.  He denies any UTIs over the last 2 years, and is urinating with a good stream.  He really denies any significant urinary complaints at this time.  He was recently started on as needed Lasix for lower extremity edema which is significantly improved his nocturia.  We discussed the relationship between lower extremity edema and nocturia, and I encouraged him to consider compression stockings and elevation of legs in the afternoon, as well as the as needed Lasix dosing.  He would like to continue Flomax and finasteride which is very reasonable.  PVR today is normal, IPSS score is 17, with quality of life mostly satisfied  Flomax and finasteride refilled RTC 1 year PVR   Billey Co, MD  [facility] [address] [place], [place] [address] [phone]

## 2021-10-04 ENCOUNTER — Other Ambulatory Visit: Payer: Self-pay | Admitting: Internal Medicine

## 2021-10-04 DIAGNOSIS — I10 Essential (primary) hypertension: Secondary | ICD-10-CM

## 2021-10-11 ENCOUNTER — Ambulatory Visit: Payer: Medicare Other | Admitting: Dermatology

## 2021-10-12 DIAGNOSIS — E119 Type 2 diabetes mellitus without complications: Secondary | ICD-10-CM | POA: Diagnosis not present

## 2021-10-12 DIAGNOSIS — H02054 Trichiasis without entropian left upper eyelid: Secondary | ICD-10-CM | POA: Diagnosis not present

## 2021-10-12 LAB — HM DIABETES EYE EXAM

## 2021-10-17 ENCOUNTER — Encounter: Payer: Self-pay | Admitting: Internal Medicine

## 2021-10-17 ENCOUNTER — Encounter: Payer: Self-pay | Admitting: Dermatology

## 2021-10-17 ENCOUNTER — Encounter: Payer: Self-pay | Admitting: Cardiovascular Disease

## 2021-10-18 ENCOUNTER — Ambulatory Visit (INDEPENDENT_AMBULATORY_CARE_PROVIDER_SITE_OTHER): Payer: Medicare Other | Admitting: *Deleted

## 2021-10-18 ENCOUNTER — Other Ambulatory Visit: Payer: Medicare Other

## 2021-10-18 ENCOUNTER — Other Ambulatory Visit: Payer: Self-pay

## 2021-10-18 DIAGNOSIS — E538 Deficiency of other specified B group vitamins: Secondary | ICD-10-CM

## 2021-10-18 MED ORDER — CYANOCOBALAMIN 1000 MCG/ML IJ SOLN
1000.0000 ug | Freq: Once | INTRAMUSCULAR | Status: AC
  Administered 2021-10-18: 17:00:00 1000 ug via INTRAMUSCULAR

## 2021-10-18 NOTE — Progress Notes (Signed)
Patient presented for B 12 injection to left deltoid, patient voiced no concerns nor showed any signs of distress during injection. 

## 2021-11-06 ENCOUNTER — Encounter: Payer: Self-pay | Admitting: Internal Medicine

## 2021-11-08 ENCOUNTER — Other Ambulatory Visit: Payer: Self-pay

## 2021-11-08 DIAGNOSIS — E119 Type 2 diabetes mellitus without complications: Secondary | ICD-10-CM

## 2021-11-08 DIAGNOSIS — I10 Essential (primary) hypertension: Secondary | ICD-10-CM

## 2021-11-08 MED ORDER — METFORMIN HCL ER 500 MG PO TB24: ORAL_TABLET | ORAL | 3 refills | Status: DC

## 2021-11-08 MED ORDER — OLMESARTAN MEDOXOMIL 40 MG PO TABS: ORAL_TABLET | ORAL | 1 refills | Status: DC

## 2021-11-15 ENCOUNTER — Ambulatory Visit (INDEPENDENT_AMBULATORY_CARE_PROVIDER_SITE_OTHER): Payer: Medicare Other | Admitting: Dermatology

## 2021-11-15 ENCOUNTER — Other Ambulatory Visit: Payer: Self-pay

## 2021-11-15 DIAGNOSIS — L578 Other skin changes due to chronic exposure to nonionizing radiation: Secondary | ICD-10-CM

## 2021-11-15 DIAGNOSIS — C44319 Basal cell carcinoma of skin of other parts of face: Secondary | ICD-10-CM | POA: Diagnosis not present

## 2021-11-15 DIAGNOSIS — Z872 Personal history of diseases of the skin and subcutaneous tissue: Secondary | ICD-10-CM

## 2021-11-15 DIAGNOSIS — D485 Neoplasm of uncertain behavior of skin: Secondary | ICD-10-CM

## 2021-11-15 NOTE — Patient Instructions (Addendum)

## 2021-11-15 NOTE — Progress Notes (Signed)
° °  Follow-Up Visit   Subjective  [name] is a 88 y.o. male who presents for the following: Actinic Keratosis (Check new or persistent precancerous lesions on the face and scalp.). He has a new lesion on his L eyebrow that he would like checked today. He did squeeze it but only blood came out.  The patient has spots, moles and lesions to be evaluated, some may be new or changing and the patient has concerns that these could be cancer.  The following portions of the chart were reviewed this encounter and updated as appropriate:   Tobacco   Allergies   Meds   Problems   Med Hx   Surg Hx   Fam Hx      Review of Systems:  No other skin or systemic complaints except as noted in HPI or Assessment and Plan.  Objective  Well appearing patient in no apparent distress; mood and affect are within normal limits.  A focused examination was performed including the face. Relevant physical exam findings are noted in the Assessment and Plan.  L eyebrow Pink papule 1.0 cm    Face Clear.    Assessment & Plan  Neoplasm of uncertain behavior of skin L eyebrow Skin / nail biopsy Type of biopsy: tangential   Informed consent: discussed and consent obtained   Timeout: patient name, date of birth, surgical site, and procedure verified   Procedure prep:  Patient was prepped and draped in usual sterile fashion Prep type:  Isopropyl alcohol Anesthesia: the lesion was anesthetized in a standard fashion   Anesthetic:  1% lidocaine w/ epinephrine 1-100,000 buffered w/ 8.4% NaHCO3 Instrument used: flexible razor blade   Hemostasis achieved with: pressure, aluminum chloride and electrodesiccation   Outcome: patient tolerated procedure well   Post-procedure details: sterile dressing applied and wound care instructions given   Dressing type: bandage and petrolatum    Specimen 1 - Surgical pathology Differential Diagnosis: D48.5 r/o BCC vs other  Check Margins: No  History of actinic  keratosis Face Clear. Observe for recurrence. Call clinic for new or changing lesions.  Recommend regular skin exams, daily broad-spectrum spf 30+ sunscreen use, and photoprotection.    Actinic Damage - chronic, secondary to cumulative UV radiation exposure/sun exposure over time - diffuse scaly erythematous macules with underlying dyspigmentation - Recommend daily broad spectrum sunscreen SPF 30+ to sun-exposed areas, reapply every 2 hours as needed.  - Recommend staying in the shade or wearing long sleeves, sun glasses (UVA+UVB protection) and wide brim hats (4-inch brim around the entire circumference of the hat). - Call for new or changing lesions.  Return in about 6 months (around 05/15/2022).  [name], CMA, am acting as scribe for Sarina Ser, MD . Documentation: I have reviewed the above documentation for accuracy and completeness, and I agree with the above.  Sarina Ser, MD

## 2021-11-16 ENCOUNTER — Encounter: Payer: Self-pay | Admitting: Dermatology

## 2021-11-21 ENCOUNTER — Telehealth: Payer: Self-pay

## 2021-11-21 ENCOUNTER — Ambulatory Visit (INDEPENDENT_AMBULATORY_CARE_PROVIDER_SITE_OTHER): Payer: Medicare Other

## 2021-11-21 ENCOUNTER — Other Ambulatory Visit: Payer: Self-pay

## 2021-11-21 DIAGNOSIS — E538 Deficiency of other specified B group vitamins: Secondary | ICD-10-CM

## 2021-11-21 MED ORDER — CYANOCOBALAMIN 1000 MCG/ML IJ SOLN
1000.0000 ug | Freq: Once | INTRAMUSCULAR | Status: AC
  Administered 2021-11-21: 1000 ug via INTRAMUSCULAR

## 2021-11-21 NOTE — Progress Notes (Signed)
Patient came in today for B-12 injection given in right deltoid IM. Patient tolerated well with no signs of distress.  °

## 2021-11-21 NOTE — Telephone Encounter (Signed)
Patient advised of BX results. He would like to discuss with family member regarding where to go for referral and then call our office back to let us know. aw

## 2021-11-21 NOTE — Telephone Encounter (Signed)
Left message on voicemail to return my call.  

## 2021-11-21 NOTE — Telephone Encounter (Signed)
-----   Message from Ralene Bathe, MD sent at 11/16/2021  7:11 PM EST ----- Diagnosis Skin , left eyebrow BASAL CELL CARCINOMA, NODULAR PATTERN, PERIPHERAL AND DEEP MARGINS INVOLVED  Cancer - BCC Schedule for MOHS

## 2021-11-23 NOTE — Telephone Encounter (Signed)
Spoke with patient regarding BX results over the phone. Sent a MyChart message with information so the patient could discuss with family and decide on referral for MOHS.   Patient sent back these questions: 1.      Does this surgery need to be done immediately? 2.      If not, can we keep a close check on it for any changes and decide at a            later time about the surgery?        3        I have an appointment scheduled with Dr. Nehemiah Massed in July.   Please get Dr. Alveria Apley thoughts on this for me   Thanks      [name]

## 2021-12-04 ENCOUNTER — Other Ambulatory Visit: Payer: Self-pay

## 2021-12-04 ENCOUNTER — Ambulatory Visit (INDEPENDENT_AMBULATORY_CARE_PROVIDER_SITE_OTHER): Payer: Medicare Other | Admitting: Dermatology

## 2021-12-04 DIAGNOSIS — C44319 Basal cell carcinoma of skin of other parts of face: Secondary | ICD-10-CM

## 2021-12-04 NOTE — Patient Instructions (Signed)

## 2021-12-04 NOTE — Progress Notes (Signed)
° °  Follow-Up Visit   Subjective  [name] is a 88 y.o. male who presents for the following: BCC  (Bx proven of the L brow - patient is here today to discuss pathology results and treatment options. Patient would like to discuss whether or not treatment needs to be done since he is 88 yo. ).  We had recommended referral for MOHS, but pt does not necessarily feel he wants to pursue MOHS.  The following portions of the chart were reviewed this encounter and updated as appropriate:   Tobacco   Allergies   Meds   Problems   Med Hx   Surg Hx   Fam Hx      Review of Systems:  No other skin or systemic complaints except as noted in HPI or Assessment and Plan.  Objective  Well appearing patient in no apparent distress; mood and affect are within normal limits.  A focused examination was performed including the face. Relevant physical exam findings are noted in the Assessment and Plan.  L brow Crust.   Assessment & Plan  Basal cell carcinoma (BCC) of skin of other part of face L brow Bx proven - Patient declines Mohs due to age which I think is reasonable. Discussed treatment options of ED&C which we will plan on treating in 4-8 weeks. Advised there may be slightly higher risk of recurrence with simpler procedure, but would not likely have any effect on quality of life even if recurred. Pt wants to pursue [facility]  Return for [facility] in 4-8 weeks.  [name], CMA, am acting as scribe for Sarina Ser, MD . Documentation: I have reviewed the above documentation for accuracy and completeness, and I agree with the above.  Sarina Ser, MD

## 2021-12-05 ENCOUNTER — Encounter: Payer: Self-pay | Admitting: Dermatology

## 2021-12-12 ENCOUNTER — Encounter: Payer: Self-pay | Admitting: Internal Medicine

## 2021-12-13 NOTE — Telephone Encounter (Signed)
Yes, can schedule appt to f/u on blood pressure and adjust medication

## 2021-12-13 NOTE — Telephone Encounter (Signed)
Called patient to confirm no acute issues. No head aches, blurred vision, dizziness, etc. No sob or chest pain. He had not changed batteries in his cuff in awhile and changed them yesterday. BP reading today was 143/78. Do you want to see him within the next few weeks and see if we need to make any adjustments in his meds?

## 2021-12-14 NOTE — Telephone Encounter (Signed)
BP 154/72 last night  and 141/60 this morning. Advised we could do appt in a couple of weeks. Pt requested sooner appt so that he could determine if meds need to be changed before he is due for refill. Pt scheduled for tomorrow at 2:30

## 2021-12-15 ENCOUNTER — Other Ambulatory Visit: Payer: Self-pay

## 2021-12-15 ENCOUNTER — Ambulatory Visit (INDEPENDENT_AMBULATORY_CARE_PROVIDER_SITE_OTHER): Payer: Medicare Other | Admitting: Internal Medicine

## 2021-12-15 DIAGNOSIS — G7000 Myasthenia gravis without (acute) exacerbation: Secondary | ICD-10-CM

## 2021-12-15 DIAGNOSIS — E1165 Type 2 diabetes mellitus with hyperglycemia: Secondary | ICD-10-CM

## 2021-12-15 DIAGNOSIS — I10 Essential (primary) hypertension: Secondary | ICD-10-CM | POA: Diagnosis not present

## 2021-12-15 MED ORDER — AMLODIPINE BESYLATE 10 MG PO TABS: 10.0000 mg | ORAL_TABLET | Freq: Every day | ORAL | 1 refills | Status: DC

## 2021-12-15 NOTE — Progress Notes (Signed)
Patient ID: [name], male   DOB: 12/17/1925, 88 y.o.   MRN: [id number]   Subjective:    Patient ID: [name], male    DOB: Jun 18, 1926, 88 y.o.   MRN: [id number]  This visit occurred during the SARS-CoV-2 public health emergency.  Safety protocols were in place, including screening questions prior to the visit, additional usage of staff PPE, and extensive cleaning of exam room while observing appropriate contact time as indicated for disinfecting solutions.   Patient here for work in appt.    Chief Complaint  Patient presents with   Hypertension   .   HPI Work in to discuss elevated blood pressure.  Taking his medication regularly.  Has noticed recently, blood pressure elevated.  Recent blood pressures reviewed:  averaging 140-150s/70-80s. No chest pain.  Breathing stable.  No increased lower extremity swelling.  No change in diet.     Past Medical History:  Diagnosis Date   Actinic keratosis 02/01/2021   midline anterior chin    Allergic rhinitis    Basal cell carcinoma 01/29/2007   Right mid forehead. Excised: 03/24/2007, margins free.   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 Excised 07/27/2014, residual focus AFX, margins free.   Squamous cell carcinoma of skin 04/01/2007   Right forehead, 2cm above mid brow. WD SCC wtih superficial infiltration. Excised: 05/14/2007, margins free   Squamous cell carcinoma of skin 05/16/2016   Crown. WD SCC, ulcerated. Excised: 07/03/2016, residual MD SCC,  deep margin involved. Excised: 07/17/2016, margins free.   TIA (transient ischemic attack) 09/13/2015   Urinary outflow obstruction    mild   Past Surgical History:  Procedure Laterality Date   PARTIAL HIP ARTHROPLASTY  01/2019   PILONIDAL CYST EXCISION  1951   removal   ROTATOR CUFF REPAIR  1995   SKIN CANCER EXCISION     multiple   SQUAMOUS CELL CARCINOMA EXCISION     behind head   TONSILLECTOMY AND ADENOIDECTOMY  1938   Family History  Problem Relation Age of Onset   Heart disease Father        heart atack   Alzheimer's disease Mother    CVA Brother    Social History   Socioeconomic History   Marital status: Married    Spouse name: Not on file   Number of children: Not on file   Years of education: Not on file   Highest education level: Not on file  Occupational History   Not on file  Tobacco Use   Smoking status: Never   Smokeless tobacco: Never  Vaping Use   Vaping Use: Never used  Substance and Sexual Activity   Alcohol use: No    Alcohol/week: 0.0 standard drinks   Drug use: No   Sexual activity: Not Currently    Birth control/protection: None  Other Topics Concern   Not on file  Social History Narrative   Retired, married. Walks everyday  Social Determinants of Health   Financial Resource Strain: Not on file  Food Insecurity: Not on file  Transportation Needs: Not on file  Physical Activity: Not on file  Stress: Not on file  Social Connections: Not on file     Review of Systems  Constitutional:  Negative for appetite change and unexpected weight change.  HENT:  Negative for congestion and sinus pressure.   Respiratory:  Negative for cough, chest tightness and shortness of breath.   Cardiovascular:  Negative for chest pain and palpitations.       No increased leg swelling.   Gastrointestinal:  Negative for abdominal pain, diarrhea, nausea and vomiting.  Genitourinary:  Negative for difficulty urinating and dysuria.  Musculoskeletal:   Negative for joint swelling and myalgias.  Skin:  Negative for color change and rash.  Neurological:  Negative for dizziness, light-headedness and headaches.  Psychiatric/Behavioral:  Negative for agitation and dysphoric mood.       Objective:     BP (!) 148/72    Pulse 73    Temp 97.9 F (36.6 C)    Resp 16    Ht _0  (1.803 m)    Wt 223 lb 3.2 oz (101.2 kg)    SpO2 98%    BMI 31.13 kg/m  Wt Readings from Last 3 Encounters:  12/15/21 223 lb 3.2 oz (101.2 kg)  09/27/21 225 lb 1.6 oz (102.1 kg)  09/18/21 223 lb 12.8 oz (101.5 kg)    Physical Exam Constitutional:      General: He is not in acute distress.    Appearance: Normal appearance. He is well-developed.  HENT:     Head: Normocephalic and atraumatic.     Right Ear: External ear normal.     Left Ear: External ear normal.  Eyes:     General: No scleral icterus.       Right eye: No discharge.        Left eye: No discharge.  Cardiovascular:     Rate and Rhythm: Normal rate and regular rhythm.  Pulmonary:     Effort: Pulmonary effort is normal. No respiratory distress.     Breath sounds: Normal breath sounds.  Abdominal:     General: Bowel sounds are normal.     Palpations: Abdomen is soft.     Tenderness: There is no abdominal tenderness.  Musculoskeletal:        General: No tenderness.     Cervical back: Neck supple. No tenderness.     Comments: No increased swelling.    Lymphadenopathy:     Cervical: No cervical adenopathy.  Skin:    Findings: No erythema or rash.  Neurological:     Mental Status: He is alert.  Psychiatric:        Mood and Affect: Mood normal.        Behavior: Behavior normal.     Outpatient Encounter Medications as of 12/15/2021  Medication Sig   amLODipine (NORVASC) 10 MG tablet Take 1 tablet (10 mg total) by mouth daily.   Accu-Chek FastClix Lancets MISC USE 1  TO CHECK SUGARS ONCE DAILY. Dx E11.9   aspirin 81 MG EC tablet Take 81 mg by mouth daily.   calcium-vitamin D (OSCAL WITH D)  500-200 MG-UNIT tablet Take 1 tablet by mouth 2 (two) times daily.   ferrous sulfate 325 (65 FE) MG EC tablet Take 325 mg by mouth 3 (three) times daily with meals.   finasteride (PROSCAR) 5 MG tablet TAKE 1 TABLET(5 MG)  BY MOUTH DAILY   furosemide (LASIX) 20 MG tablet Take 1 tablet (20 mg total) by mouth daily as needed. For shortness of breath and swelling   glucose blood (ACCU-CHEK COMPACT PLUS) test strip Use once daily as directed to check blood sugars. DX: E11.9   lovastatin (MEVACOR) 20 MG tablet TAKE 1 TABLET(20 MG) BY MOUTH AT BEDTIME   metFORMIN (GLUCOPHAGE-XR) 500 MG 24 hr tablet TAKE ONE TABLET BY MOUTH EVERY DAY WITH BREAKFAST   mometasone (ELOCON) 0.1 % cream Apply to rash on abdomen once or twice a day prn   mycophenolate (CELLCEPT) 500 MG tablet Take 1,000 mg by mouth 2 (two) times daily.   olmesartan (BENICAR) 40 MG tablet TAKE 1 TABLET(40 MG) BY MOUTH DAILY   tamsulosin (FLOMAX) 0.4 MG CAPS capsule TAKE 1 CAPSULE(0.4 MG) BY MOUTH DAILY AFTER SUPPER   [DISCONTINUED] amLODipine (NORVASC) 5 MG tablet TAKE 1 TABLET(5 MG) BY MOUTH DAILY   No facility-administered encounter medications on file as of 12/15/2021.     Lab Results  Component Value Date   WBC 6.7 09/15/2021   HGB 12.3 (L) 09/15/2021   HCT 37.5 (L) 09/15/2021   PLT 313.0 09/15/2021   GLUCOSE 139 (H) 09/15/2021   CHOL 126 09/15/2021   TRIG 98.0 09/15/2021   HDL 44.40 09/15/2021   LDLCALC 62 09/15/2021   ALT 13 09/15/2021   AST 21 09/15/2021   NA 135 09/15/2021   K 4.8 09/15/2021   CL 100 09/15/2021   CREATININE 0.60 09/15/2021   BUN 13 09/15/2021   CO2 22 09/15/2021   TSH 2.90 09/15/2021   PSA 0.02 (L) 04/16/2018   HGBA1C 7.3 (H) 09/15/2021   MICROALBUR 43.4 (H) 01/11/2021    MR LUMBAR SPINE WO CONTRAST  Addendum Date: 05/01/2018   ADDENDUM REPORT: 05/01/2018 17:12 ADDENDUM: Chronic T12, L2, L3, and L5 compression fractures. Electronically Signed   By: Logan Bores M.D.   On: 05/01/2018 17:12   Result  Date: 05/01/2018 CLINICAL DATA:  Lumbar radiculopathy. Low back and left leg pain for couple of months. EXAM: MRI LUMBAR SPINE WITHOUT CONTRAST TECHNIQUE: Multiplanar, multisequence MR imaging of the lumbar spine was performed. No intravenous contrast was administered. COMPARISON:  None. FINDINGS: Segmentation:  Standard. Alignment: Mild lumbar levoscoliosis. 3 mm anterolisthesis of L3 on L4. Vertebrae: T12, L2, L3, and L5 compression fractures with mild-to-moderate vertebral body height loss and no significant marrow edema. 3 mm retropulsion of the posterosuperior T12 vertebral body. Mild periarticular marrow and soft tissue edema associated with left L5-S1 facet arthritis. Conus medullaris and cauda equina: Conus extends to the lower L1 level. Conus and cauda equina appear normal. Paraspinal and other soft tissues: Mild left lower lumbar posterior paraspinal muscle, possibly strain. Disc levels: Disc desiccation throughout the lower thoracic and lumbar spine. Mild disc space narrowing at L2-3 and L4-5. T11-12: Mild T12 superior endplate retropulsion, mild disc bulging, and mild facet arthrosis result in borderline to mild spinal stenosis without neural foraminal stenosis. T12-L1: Mild disc bulging and moderate facet hypertrophy without stenosis. L1-2: Mild circumferential disc bulging and moderate facet hypertrophy result in minimal right neural foraminal narrowing without spinal stenosis. L2-3: Circumferential disc bulging, a broad right subarticular to foraminal disc protrusion, and moderate facet hypertrophy result in minimal right lateral recess narrowing without spinal or neural foraminal stenosis. L3-4: Anterolisthesis with slight bulging of uncovered disc, mildly prominent dorsal epidural fat, and severe facet hypertrophy result in mild-to-moderate spinal stenosis, mild bilateral lateral recess stenosis, and mild bilateral neural foraminal  stenosis. There is likely facet ankylosis bilaterally. L4-5: Disc  bulging, mild ligamentum flavum hypertrophy, and moderate left greater than right facet hypertrophy result in mild spinal stenosis, mild-to-moderate left greater than right lateral recess stenosis, and minimal bilateral neural foraminal stenosis. L5-S1: Disc bulging and severe left greater than right facet arthrosis result in mild left lateral recess stenosis and mild right and severe left neural foraminal stenosis with potential left L5 nerve root impingement. There is a left facet joint effusion, and a 6 x 3 mm synovial cyst is present anterior to the left facet joint in close proximity to the exiting left L5 nerve at the lateral aspect of the foramen though without frank nerve compression. IMPRESSION: 1. Severe left L5-S1 facet arthritis with associated joint effusion and edema. Severe left neural foraminal stenosis with potential left L5 nerve root impingement. 2. Mild-to-moderate spinal and lateral recess stenosis at L3-4 and L4-5. Electronically Signed: By: Logan Bores M.D. On: 05/01/2018 13:20       Assessment & Plan:   Problem List Items Addressed This Visit     Diabetes mellitus ([place])    a1c on recent check 7.3.  No changes in medication.  Low carb diet and exercise.  Follow met b and a1c.       HYPERTENSION, BENIGN    On amlodipine and benicar.  Blood pressures on outside checks as outlined.  Will increase amlodipine to 20m q day.  Follow pressures.  Follow metabolic panel.       Relevant Medications   amLODipine (NORVASC) 10 MG tablet   [place]is [place]    Followed by neurology.  Stable.  Continue cellcept.  Follow cbc.         CEinar Pheasant MD

## 2021-12-16 ENCOUNTER — Encounter: Payer: Self-pay | Admitting: Internal Medicine

## 2021-12-16 NOTE — Assessment & Plan Note (Signed)
Followed by neurology.  Stable.  Continue cellcept.  Follow cbc.  

## 2021-12-16 NOTE — Assessment & Plan Note (Signed)
On amlodipine and benicar.  Blood pressures on outside checks as outlined.  Will increase amlodipine to 10mg  q day.  Follow pressures.  Follow metabolic panel.

## 2021-12-16 NOTE — Assessment & Plan Note (Signed)
a1c on recent check 7.3.  No changes in medication.  Low carb diet and exercise.  Follow met b and a1c.  °

## 2021-12-22 ENCOUNTER — Other Ambulatory Visit: Payer: Self-pay

## 2021-12-22 ENCOUNTER — Ambulatory Visit (INDEPENDENT_AMBULATORY_CARE_PROVIDER_SITE_OTHER): Payer: Medicare Other | Admitting: *Deleted

## 2021-12-22 DIAGNOSIS — E538 Deficiency of other specified B group vitamins: Secondary | ICD-10-CM | POA: Diagnosis not present

## 2021-12-22 MED ORDER — CYANOCOBALAMIN 1000 MCG/ML IJ SOLN
1000.0000 ug | Freq: Once | INTRAMUSCULAR | Status: AC
  Administered 2021-12-22: 1000 ug via INTRAMUSCULAR

## 2021-12-22 NOTE — Progress Notes (Signed)
Pt arrived for B12 injection, given in L deltoid. Pt tolerated injection well, showed no signs of distress nor voiced any concerns.  ?

## 2021-12-23 ENCOUNTER — Other Ambulatory Visit: Payer: Self-pay | Admitting: Internal Medicine

## 2021-12-23 DIAGNOSIS — E119 Type 2 diabetes mellitus without complications: Secondary | ICD-10-CM

## 2022-01-01 ENCOUNTER — Other Ambulatory Visit: Payer: Self-pay

## 2022-01-01 ENCOUNTER — Encounter: Payer: Self-pay | Admitting: Podiatry

## 2022-01-01 ENCOUNTER — Ambulatory Visit (INDEPENDENT_AMBULATORY_CARE_PROVIDER_SITE_OTHER): Payer: Medicare Other | Admitting: Podiatry

## 2022-01-01 DIAGNOSIS — M79675 Pain in left toe(s): Secondary | ICD-10-CM

## 2022-01-01 DIAGNOSIS — M79674 Pain in right toe(s): Secondary | ICD-10-CM

## 2022-01-01 DIAGNOSIS — B351 Tinea unguium: Secondary | ICD-10-CM | POA: Diagnosis not present

## 2022-01-01 DIAGNOSIS — E1142 Type 2 diabetes mellitus with diabetic polyneuropathy: Secondary | ICD-10-CM

## 2022-01-01 DIAGNOSIS — Q828 Other specified congenital malformations of skin: Secondary | ICD-10-CM

## 2022-01-01 NOTE — Progress Notes (Signed)
This patient returns to my office for at risk foot care.  This patient requires this care by a professional since this patient will be at risk due to having type 2 diabetes.    This patient is unable to cut nails himself since the patient cannot reach his nails.These nails are painful walking and wearing shoes.   This patient presents for at risk foot care today.  General Appearance  Alert, conversant and in no acute stress.  Vascular  Dorsalis pedis and posterior tibial  pulses are weakly  palpable  bilaterally.  Capillary return is within normal limits  bilaterally. Temperature is within normal limits  bilaterally.  Neurologic  Senn-Weinstein monofilament wire test diminished  bilaterally. Muscle power within normal limits bilaterally.  Nails Thick disfigured discolored nails with subungual debris  from hallux to fifth toes bilaterally. No evidence of bacterial infection or drainage bilaterally.  Orthopedic  No limitations of motion  feet .  No crepitus or effusions noted.  No bony pathology or digital deformities noted. Plantar flexed fifth metatarsal left foot.  Mild  HAV  Skin  normotropic skin with no porokeratosis noted bilaterally.  No signs of infections or ulcers noted.   Symptomatic callus sub 5th  Left.  Onychomycosis  Pain in right toes  Pain in left toes  Consent was obtained for treatment procedures.   Mechanical debridement of nails 1-5  bilaterally performed with a nail nipper.  Filed with dremel without incident. .  Debride porokeratosis with # 15 blade. . 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   Return office visit   3 months                  Told patient to return for periodic foot care and evaluation due to potential at risk complications.   Gardiner Barefoot DPM

## 2022-01-04 ENCOUNTER — Encounter: Payer: Self-pay | Admitting: Podiatry

## 2022-01-04 ENCOUNTER — Other Ambulatory Visit: Payer: Self-pay

## 2022-01-04 ENCOUNTER — Ambulatory Visit (INDEPENDENT_AMBULATORY_CARE_PROVIDER_SITE_OTHER): Payer: Medicare Other | Admitting: Podiatry

## 2022-01-04 DIAGNOSIS — M2010 Hallux valgus (acquired), unspecified foot: Secondary | ICD-10-CM

## 2022-01-04 DIAGNOSIS — E1142 Type 2 diabetes mellitus with diabetic polyneuropathy: Secondary | ICD-10-CM

## 2022-01-04 NOTE — Progress Notes (Signed)
Patient presents for diabetic shoe pick up, shoes are tried on for good fit.  Patient received 1 Pair and 3 pairs custom molded diabetic inserts.  Verbal and written break in and wear instructions given.  Patient will follow up for scheduled routine care.   

## 2022-01-04 NOTE — Progress Notes (Signed)
Patient presents to pick up his diabetic shoes. Patient has DPN and HAV  B/L. ? ?Diagnosis  Diabetes with neuropathy ? ?ROV.  Patient presents today and was dispensed 0ne pair ( two units) of medically necessary extra depth shoes with three pair( six units) of custom molded multiple density inserts. The shoes and the inserts are fitted to the patients ' feet and are noted to fit well and are free of defect.  Length and width of the shoes are also acceptable.  Patient was given written and verbal  instructions for wearing.  If any concerns arrive with the shoes or inserts, the patient is to call the office.Patient is to follow up with doctor in six weeks.  ? ?Gardiner Barefoot DPM  ? ? ?

## 2022-01-08 ENCOUNTER — Other Ambulatory Visit: Payer: Self-pay | Admitting: Internal Medicine

## 2022-01-10 DIAGNOSIS — H02054 Trichiasis without entropian left upper eyelid: Secondary | ICD-10-CM | POA: Diagnosis not present

## 2022-01-11 ENCOUNTER — Other Ambulatory Visit: Payer: Self-pay | Admitting: Internal Medicine

## 2022-01-11 ENCOUNTER — Encounter: Payer: Self-pay | Admitting: Internal Medicine

## 2022-01-12 ENCOUNTER — Other Ambulatory Visit: Payer: Medicare Other

## 2022-01-15 ENCOUNTER — Other Ambulatory Visit: Payer: Self-pay

## 2022-01-15 MED ORDER — ACCU-CHEK GUIDE VI STRP: ORAL_STRIP | 5 refills | Status: DC

## 2022-01-16 ENCOUNTER — Other Ambulatory Visit: Payer: Self-pay

## 2022-01-16 ENCOUNTER — Other Ambulatory Visit (INDEPENDENT_AMBULATORY_CARE_PROVIDER_SITE_OTHER): Payer: Medicare Other

## 2022-01-16 DIAGNOSIS — D649 Anemia, unspecified: Secondary | ICD-10-CM

## 2022-01-16 DIAGNOSIS — E7800 Pure hypercholesterolemia, unspecified: Secondary | ICD-10-CM

## 2022-01-16 DIAGNOSIS — E1165 Type 2 diabetes mellitus with hyperglycemia: Secondary | ICD-10-CM

## 2022-01-16 DIAGNOSIS — E538 Deficiency of other specified B group vitamins: Secondary | ICD-10-CM

## 2022-01-16 LAB — BASIC METABOLIC PANEL
BUN: 13 mg/dL (ref 6–23)
CO2: 28 mEq/L (ref 19–32)
Calcium: 9.6 mg/dL (ref 8.4–10.5)
Chloride: 97 mEq/L (ref 96–112)
Creatinine, Ser: 0.78 mg/dL (ref 0.40–1.50)
GFR: 76.99 mL/min (ref >60.00)
Glucose, Bld: 134 mg/dL — ABNORMAL HIGH (ref 70–99)
Potassium: 4.1 mEq/L (ref 3.5–5.1)
Sodium: 133 mEq/L — ABNORMAL LOW (ref 135–145)

## 2022-01-16 LAB — CBC WITH DIFFERENTIAL/PLATELET
Basophils Absolute: 0.1 10*3/uL (ref 0.0–0.1)
Basophils Relative: 1.1 % (ref 0.0–3.0)
Eosinophils Absolute: 0.1 10*3/uL (ref 0.0–0.7)
Eosinophils Relative: 1.7 % (ref 0.0–5.0)
HCT: 35.7 % — ABNORMAL LOW (ref 39.0–52.0)
Hemoglobin: 11.9 g/dL — ABNORMAL LOW (ref 13.0–17.0)
Lymphocytes Relative: 20.3 % (ref 12.0–46.0)
Lymphs Abs: 1.4 10*3/uL (ref 0.7–4.0)
MCHC: 33.4 g/dL (ref 30.0–36.0)
MCV: 92.5 fl (ref 78.0–100.0)
Monocytes Absolute: 0.6 10*3/uL (ref 0.1–1.0)
Monocytes Relative: 8.1 % (ref 3.0–12.0)
Neutro Abs: 4.9 10*3/uL (ref 1.4–7.7)
Neutrophils Relative %: 68.8 % (ref 43.0–77.0)
Platelets: 374 10*3/uL (ref 150.0–400.0)
RBC: 3.86 Mil/uL — ABNORMAL LOW (ref 4.22–5.81)
RDW: 13.5 % (ref 11.5–15.5)
WBC: 7.1 10*3/uL (ref 4.0–10.5)

## 2022-01-16 LAB — HEPATIC FUNCTION PANEL
ALT: 13 U/L (ref 0–53)
AST: 16 U/L (ref 0–37)
Albumin: 4 g/dL (ref 3.5–5.2)
Alkaline Phosphatase: 36 U/L — ABNORMAL LOW (ref 39–117)
Bilirubin, Direct: 0.1 mg/dL (ref 0.0–0.3)
Total Bilirubin: 0.8 mg/dL (ref 0.2–1.2)
Total Protein: 7 g/dL (ref 6.0–8.3)

## 2022-01-16 LAB — MICROALBUMIN / CREATININE URINE RATIO
Creatinine,U: 69.5 mg/dL
Microalb Creat Ratio: 21.9 mg/g (ref 0.0–30.0)
Microalb, Ur: 15.2 mg/dL — ABNORMAL HIGH (ref 0.0–1.9)

## 2022-01-16 LAB — LIPID PANEL
Cholesterol: 126 mg/dL (ref 0–200)
HDL: 45 mg/dL (ref >39.00)
LDL Cholesterol: 57 mg/dL (ref 0–99)
NonHDL: 80.83
Total CHOL/HDL Ratio: 3
Triglycerides: 119 mg/dL (ref 0.0–149.0)
VLDL: 23.8 mg/dL (ref 0.0–40.0)

## 2022-01-16 LAB — HEMOGLOBIN A1C: Hgb A1c MFr Bld: 7.3 % — ABNORMAL HIGH (ref 4.6–6.5)

## 2022-01-16 LAB — FERRITIN: Ferritin: 97.6 ng/mL (ref 22.0–322.0)

## 2022-01-16 LAB — VITAMIN B12: Vitamin B-12: 617 pg/mL (ref 211–911)

## 2022-01-17 ENCOUNTER — Other Ambulatory Visit: Payer: Self-pay

## 2022-01-17 DIAGNOSIS — E1165 Type 2 diabetes mellitus with hyperglycemia: Secondary | ICD-10-CM

## 2022-01-17 MED ORDER — ACCU-CHEK FASTCLIX LANCETS MISC: 3 refills | Status: DC

## 2022-01-18 ENCOUNTER — Ambulatory Visit (INDEPENDENT_AMBULATORY_CARE_PROVIDER_SITE_OTHER): Payer: Medicare Other | Admitting: Internal Medicine

## 2022-01-18 ENCOUNTER — Other Ambulatory Visit: Payer: Self-pay

## 2022-01-18 ENCOUNTER — Encounter: Payer: Self-pay | Admitting: Internal Medicine

## 2022-01-18 VITALS — BP 136/72 | HR 72 | Temp 98.1°F | Resp 16 | Ht 70.0 in | Wt 225.0 lb

## 2022-01-18 DIAGNOSIS — E7800 Pure hypercholesterolemia, unspecified: Secondary | ICD-10-CM

## 2022-01-18 DIAGNOSIS — E538 Deficiency of other specified B group vitamins: Secondary | ICD-10-CM

## 2022-01-18 DIAGNOSIS — H353221 Exudative age-related macular degeneration, left eye, with active choroidal neovascularization: Secondary | ICD-10-CM | POA: Diagnosis not present

## 2022-01-18 DIAGNOSIS — E871 Hypo-osmolality and hyponatremia: Secondary | ICD-10-CM

## 2022-01-18 DIAGNOSIS — I10 Essential (primary) hypertension: Secondary | ICD-10-CM | POA: Diagnosis not present

## 2022-01-18 DIAGNOSIS — Z0000 Encounter for general adult medical examination without abnormal findings: Secondary | ICD-10-CM

## 2022-01-18 DIAGNOSIS — G7000 Myasthenia gravis without (acute) exacerbation: Secondary | ICD-10-CM | POA: Diagnosis not present

## 2022-01-18 DIAGNOSIS — E1165 Type 2 diabetes mellitus with hyperglycemia: Secondary | ICD-10-CM | POA: Diagnosis not present

## 2022-01-18 DIAGNOSIS — D649 Anemia, unspecified: Secondary | ICD-10-CM

## 2022-01-18 MED ORDER — ACCU-CHEK GUIDE VI STRP: ORAL_STRIP | 5 refills | Status: DC

## 2022-01-18 NOTE — Assessment & Plan Note (Signed)
Followed by neurology.  Stable.  Continue cellcept.  Follow cbc.  

## 2022-01-18 NOTE — Assessment & Plan Note (Signed)
Physical today 01/18/22.   ?

## 2022-01-18 NOTE — Assessment & Plan Note (Signed)
On lovastatin.  Low cholesterol diet and exercise.  Follow lipid panel and liver function tests.   

## 2022-01-18 NOTE — Progress Notes (Signed)
Patient ID: [name], male   DOB: 1926-08-16, 88 y.o.   MRN: [id number] ? ? ?Subjective:  ? ? Patient ID: [name], male    DOB: 01-Apr-1926, 88 y.o.   MRN: [id number] ? ?This visit occurred during the SARS-CoV-2 public health emergency.  Safety protocols were in place, including screening questions prior to the visit, additional usage of staff PPE, and extensive cleaning of exam room while observing appropriate contact time as indicated for disinfecting solutions.  ? ? ?HPI ?With past history of hypertension, diabetes and hypercholesterolemia.  He comes in today to follow up on these issues as well as for a complete physical exam.  He reports he is doing relatively well.  No recent falls.  No chest pain or sob reported.  No cough or congestion.  No abdominal pain.  Bowels moving.  Weight stable.  Occasionally will take lasix.  Discussed labs.  Reviewed outside blood pressure readings - attached.   ? ? ?Past Medical History:  ?Diagnosis Date  ? Actinic keratosis 02/01/2021  ? midline anterior chin   ? Allergic rhinitis   ? Basal cell carcinoma 01/29/2007  ? Right mid forehead. Excised: 03/24/2007, margins free.  ? Basal cell carcinoma 11/15/2021  ? L eyebrow -schedule mohs  ? BPH (benign prostatic hypertrophy)   ? Degenerative arthritis   ? Diabetes mellitus ([place])   ? GERD (gastroesophageal reflux disease)   ? HTN (hypertension)   ? Hx of basal cell carcinoma 06/29/2020  ? L crown scalp, EDC  ? Hypercholesterolemia   ? HYPERTENSION, BENIGN 08/15/2010  ? Qualifier: Diagnosis of  By: Rockey Situ MD, Tim    ? Iron deficiency anemia   ? Skin cancer 06/23/2014  ? Vertex scalp. 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 Excised 07/27/2014, residual focus AFX, margins free.  ? Squamous cell carcinoma of skin 04/01/2007  ? Right forehead, 2cm above mid brow. WD SCC wtih superficial infiltration. Excised: 05/14/2007, margins free  ? Squamous cell carcinoma of skin 05/16/2016  ? Crown. WD SCC,  ulcerated. Excised: 07/03/2016, residual MD SCC, deep margin involved. Excised: 07/17/2016, margins free.  ? TIA (transient ischemic attack) 09/13/2015  ? Urinary outflow obstruction   ? mild  ? ?Past Surgical History:  ?Procedure Laterality Date  ? PARTIAL HIP ARTHROPLASTY  01/2019  ? PILONIDAL CYST EXCISION  1951  ? removal  ? Coaling  ? SKIN CANCER EXCISION    ? multiple  ? SQUAMOUS CELL CARCINOMA EXCISION    ? behind head  ? TONSILLECTOMY AND ADENOIDECTOMY  1938  ? ?Family History  ?Problem Relation Age of Onset  ? Heart disease Father   ?     heart atack  ? Alzheimer's disease Mother   ? CVA Brother   ? ?Social History  ? ?Socioeconomic History  ? Marital status: Married  ?  Spouse name: Not on file  ? Number of children: Not on file  ? Years of education: Not on file  ? Highest education level: Not on file  ?Occupational History  ? Not on file  ?Tobacco Use  ? Smoking status: Never  ? Smokeless tobacco: Never  ?Vaping Use  ? Vaping Use: Never used  ?Substance and Sexual Activity  ? Alcohol use: No  ?  Alcohol/week: 0.0 standard drinks  ? Drug use: No  ? Sexual activity: Not Currently  ?  Birth control/protection: None  ?Other Topics Concern  ? Not on file  ?Social History Narrative  ?  Retired, married. Walks everyday  ?   ?   ? ?Social Determinants of Health  ? ?Financial Resource Strain: Not on file  ?Food Insecurity: Not on file  ?Transportation Needs: Not on file  ?Physical Activity: Not on file  ?Stress: Not on file  ?Social Connections: Not on file  ? ? ? ?Review of Systems  ?Constitutional:  Negative for appetite change and unexpected weight change.  ?HENT:  Negative for congestion, sinus pressure and sore throat.   ?Eyes:  Negative for pain and visual disturbance.  ?Respiratory:  Negative for cough, chest tightness and shortness of breath.   ?Cardiovascular:  Negative for chest pain, palpitations and leg swelling.  ?Gastrointestinal:  Negative for abdominal pain, diarrhea, nausea and  vomiting.  ?Genitourinary:  Negative for difficulty urinating and dysuria.  ?Musculoskeletal:  Negative for joint swelling and myalgias.  ?Skin:  Negative for color change and rash.  ?Neurological:  Negative for dizziness, light-headedness and headaches.  ?Hematological:  Negative for adenopathy. Does not bruise/bleed easily.  ?Psychiatric/Behavioral:  Negative for agitation and dysphoric mood.   ? ?   ?Objective:  ?  ? ?BP 136/72   Pulse 72   Temp 98.1 ?F (36.7 ?C)   Resp 16   Ht '5\' 10"'$  (1.778 m)   Wt 225 lb (102.1 kg)   SpO2 98%   BMI 32.28 kg/m?  ?Wt Readings from Last 3 Encounters:  ?01/18/22 225 lb (102.1 kg)  ?12/15/21 223 lb 3.2 oz (101.2 kg)  ?09/27/21 225 lb 1.6 oz (102.1 kg)  ? ? ?Physical Exam ?Constitutional:   ?   General: He is not in acute distress. ?   Appearance: Normal appearance. He is well-developed.  ?HENT:  ?   Head: Normocephalic and atraumatic.  ?   Right Ear: External ear normal.  ?   Left Ear: External ear normal.  ?Eyes:  ?   General: No scleral icterus.    ?   Right eye: No discharge.     ?   Left eye: No discharge.  ?   Conjunctiva/sclera: Conjunctivae normal.  ?Neck:  ?   Thyroid: No thyromegaly.  ?Cardiovascular:  ?   Rate and Rhythm: Normal rate and regular rhythm.  ?Pulmonary:  ?   Effort: No respiratory distress.  ?   Breath sounds: Normal breath sounds. No wheezing.  ?Abdominal:  ?   General: Bowel sounds are normal.  ?   Palpations: Abdomen is soft.  ?   Tenderness: There is no abdominal tenderness.  ?Musculoskeletal:     ?   General: No swelling or tenderness.  ?   Cervical back: Neck supple. No tenderness.  ?Lymphadenopathy:  ?   Cervical: No cervical adenopathy.  ?Skin: ?   Findings: No erythema or rash.  ?Neurological:  ?   Mental Status: He is alert and oriented to person, place, and time.  ?Psychiatric:     ?   Mood and Affect: Mood normal.     ?   Behavior: Behavior normal.  ? ? ? ?Outpatient Encounter Medications as of 01/18/2022  ?Medication Sig  ? Accu-Chek  FastClix Lancets MISC USE 1  TO CHECK SUGARS ONCE DAILY. Dx E11.9  ? amLODipine (NORVASC) 10 MG tablet Take 1 tablet (10 mg total) by mouth daily.  ? aspirin 81 MG EC tablet Take 81 mg by mouth daily.  ? calcium-vitamin D (OSCAL WITH D) 500-200 MG-UNIT tablet Take 1 tablet by mouth 2 (two) times daily.  ? ferrous sulfate 325 (65 FE)  MG EC tablet Take 325 mg by mouth 3 (three) times daily with meals.  ? finasteride (PROSCAR) 5 MG tablet TAKE 1 TABLET(5 MG) BY MOUTH DAILY  ? furosemide (LASIX) 20 MG tablet Take 1 tablet (20 mg total) by mouth daily as needed. For shortness of breath and swelling  ? glucose blood (ACCU-CHEK GUIDE) test strip USE 1 STRIP TO CHECK GLUCOSE ONCE DAILY DX E 11.9  ? lovastatin (MEVACOR) 20 MG tablet TAKE 1 TABLET(20 MG) BY MOUTH AT BEDTIME  ? metFORMIN (GLUCOPHAGE-XR) 500 MG 24 hr tablet TAKE 1 TABLET BY MOUTH EVERY DAY WITH BREAKFAST  ? mometasone (ELOCON) 0.1 % cream Apply to rash on abdomen once or twice a day prn  ? mycophenolate (CELLCEPT) 500 MG tablet Take 1,000 mg by mouth 2 (two) times daily.  ? olmesartan (BENICAR) 40 MG tablet TAKE 1 TABLET(40 MG) BY MOUTH DAILY  ? tamsulosin (FLOMAX) 0.4 MG CAPS capsule TAKE 1 CAPSULE(0.4 MG) BY MOUTH DAILY AFTER SUPPER  ? [DISCONTINUED] glucose blood (ACCU-CHEK GUIDE) test strip USE 1 STRIP TO CHECK GLUCOSE ONCE DAILY  ? ?No facility-administered encounter medications on file as of 01/18/2022.  ?  ? ?Lab Results  ?Component Value Date  ? WBC 7.1 01/16/2022  ? HGB 11.9 (L) 01/16/2022  ? HCT 35.7 (L) 01/16/2022  ? PLT 374.0 01/16/2022  ? GLUCOSE 134 (H) 01/16/2022  ? CHOL 126 01/16/2022  ? TRIG 119.0 01/16/2022  ? HDL 45.00 01/16/2022  ? [place] 57 01/16/2022  ? ALT 13 01/16/2022  ? AST 16 01/16/2022  ? NA 133 (L) 01/16/2022  ? K 4.1 01/16/2022  ? CL 97 01/16/2022  ? CREATININE 0.78 01/16/2022  ? BUN 13 01/16/2022  ? CO2 28 01/16/2022  ? TSH 2.90 09/15/2021  ? PSA 0.02 (L) 04/16/2018  ? HGBA1C 7.3 (H) 01/16/2022  ? MICROALBUR 15.2 (H) 01/16/2022   ? ? ?MR LUMBAR SPINE WO CONTRAST ? ?Addendum Date: 05/01/2018   ?ADDENDUM REPORT: 05/01/2018 17:12 ADDENDUM: Chronic T12, L2, L3, and L5 compression fractures. Electronically Signed   By: Logan Bores M.D.   On: 06/27/

## 2022-01-18 NOTE — Assessment & Plan Note (Signed)
On amlodipine and benicar.  Blood pressures on outside checks as outlined.  On amlodipine '10mg'$  q day.  Follow pressures.  Follow metabolic panel.  ?

## 2022-01-18 NOTE — Assessment & Plan Note (Signed)
Follow cbc.  

## 2022-01-18 NOTE — Assessment & Plan Note (Signed)
Recent B12 wnl 

## 2022-01-18 NOTE — Assessment & Plan Note (Signed)
a1c stable at 7.3.  No changes in medication.  Low carb diet and exercise.  Follow met b and a1c.  ?

## 2022-01-19 ENCOUNTER — Encounter: Payer: Self-pay | Admitting: Internal Medicine

## 2022-01-21 ENCOUNTER — Encounter: Payer: Self-pay | Admitting: Internal Medicine

## 2022-01-21 NOTE — Assessment & Plan Note (Signed)
Off hctz.  Follow sodium.   ?

## 2022-01-22 ENCOUNTER — Ambulatory Visit: Payer: Medicare Other | Admitting: Dermatology

## 2022-01-23 ENCOUNTER — Telehealth: Payer: Self-pay

## 2022-01-23 ENCOUNTER — Other Ambulatory Visit: Payer: Self-pay

## 2022-01-23 ENCOUNTER — Ambulatory Visit (INDEPENDENT_AMBULATORY_CARE_PROVIDER_SITE_OTHER): Payer: Medicare Other | Admitting: *Deleted

## 2022-01-23 DIAGNOSIS — E538 Deficiency of other specified B group vitamins: Secondary | ICD-10-CM | POA: Diagnosis not present

## 2022-01-23 MED ORDER — CYANOCOBALAMIN 1000 MCG/ML IJ SOLN
1000.0000 ug | Freq: Once | INTRAMUSCULAR | Status: AC
  Administered 2022-01-23: 1000 ug via INTRAMUSCULAR

## 2022-01-23 NOTE — Progress Notes (Signed)
Patient presented for B 12 injection to left deltoid, patient voiced no concerns nor showed any signs of distress during injection. 

## 2022-01-23 NOTE — Telephone Encounter (Signed)
Received note from pt stating that dx code is needed on rx in order to process. Called [facility] and confirmed that they do have prescription with dx code but was told CMN documentation is needed for medicare. Waiting on form so I can fill out and send back. Patient is aware.  ?

## 2022-01-25 DIAGNOSIS — M47816 Spondylosis without myelopathy or radiculopathy, lumbar region: Secondary | ICD-10-CM | POA: Diagnosis not present

## 2022-01-25 DIAGNOSIS — M158 Other polyosteoarthritis: Secondary | ICD-10-CM | POA: Diagnosis not present

## 2022-01-25 DIAGNOSIS — R293 Abnormal posture: Secondary | ICD-10-CM | POA: Diagnosis not present

## 2022-01-25 DIAGNOSIS — M5459 Other low back pain: Secondary | ICD-10-CM | POA: Diagnosis not present

## 2022-01-25 NOTE — Telephone Encounter (Signed)
Pt called stating he went by [place] and they do not have what they need to fill prescription ?

## 2022-01-26 NOTE — Telephone Encounter (Signed)
Still have not received form from [place]. Will follow up ?

## 2022-01-28 ENCOUNTER — Encounter: Payer: Self-pay | Admitting: Internal Medicine

## 2022-01-30 DIAGNOSIS — M47816 Spondylosis without myelopathy or radiculopathy, lumbar region: Secondary | ICD-10-CM | POA: Diagnosis not present

## 2022-01-30 DIAGNOSIS — M5459 Other low back pain: Secondary | ICD-10-CM | POA: Diagnosis not present

## 2022-01-30 DIAGNOSIS — M158 Other polyosteoarthritis: Secondary | ICD-10-CM | POA: Diagnosis not present

## 2022-01-30 DIAGNOSIS — R293 Abnormal posture: Secondary | ICD-10-CM | POA: Diagnosis not present

## 2022-01-31 NOTE — Telephone Encounter (Signed)
Spoke with [facility]. Requested CMN documentation that is required in order to fill strips. Will update patient. ?

## 2022-02-01 DIAGNOSIS — M158 Other polyosteoarthritis: Secondary | ICD-10-CM | POA: Diagnosis not present

## 2022-02-01 DIAGNOSIS — R293 Abnormal posture: Secondary | ICD-10-CM | POA: Diagnosis not present

## 2022-02-01 DIAGNOSIS — M5459 Other low back pain: Secondary | ICD-10-CM | POA: Diagnosis not present

## 2022-02-01 DIAGNOSIS — M47816 Spondylosis without myelopathy or radiculopathy, lumbar region: Secondary | ICD-10-CM | POA: Diagnosis not present

## 2022-02-01 NOTE — Telephone Encounter (Signed)
CMN sent. Confirmation received. Patient is aware. ?

## 2022-02-07 ENCOUNTER — Ambulatory Visit: Payer: Medicare Other | Admitting: Dermatology

## 2022-02-07 DIAGNOSIS — M5459 Other low back pain: Secondary | ICD-10-CM | POA: Diagnosis not present

## 2022-02-07 DIAGNOSIS — M158 Other polyosteoarthritis: Secondary | ICD-10-CM | POA: Diagnosis not present

## 2022-02-07 DIAGNOSIS — M47816 Spondylosis without myelopathy or radiculopathy, lumbar region: Secondary | ICD-10-CM | POA: Diagnosis not present

## 2022-02-07 DIAGNOSIS — R293 Abnormal posture: Secondary | ICD-10-CM | POA: Diagnosis not present

## 2022-02-09 DIAGNOSIS — M5459 Other low back pain: Secondary | ICD-10-CM | POA: Diagnosis not present

## 2022-02-09 DIAGNOSIS — R293 Abnormal posture: Secondary | ICD-10-CM | POA: Diagnosis not present

## 2022-02-09 DIAGNOSIS — M47816 Spondylosis without myelopathy or radiculopathy, lumbar region: Secondary | ICD-10-CM | POA: Diagnosis not present

## 2022-02-09 DIAGNOSIS — M158 Other polyosteoarthritis: Secondary | ICD-10-CM | POA: Diagnosis not present

## 2022-02-14 DIAGNOSIS — M5459 Other low back pain: Secondary | ICD-10-CM | POA: Diagnosis not present

## 2022-02-14 DIAGNOSIS — R293 Abnormal posture: Secondary | ICD-10-CM | POA: Diagnosis not present

## 2022-02-14 DIAGNOSIS — M158 Other polyosteoarthritis: Secondary | ICD-10-CM | POA: Diagnosis not present

## 2022-02-14 DIAGNOSIS — M47816 Spondylosis without myelopathy or radiculopathy, lumbar region: Secondary | ICD-10-CM | POA: Diagnosis not present

## 2022-02-15 ENCOUNTER — Other Ambulatory Visit (INDEPENDENT_AMBULATORY_CARE_PROVIDER_SITE_OTHER): Payer: Medicare Other

## 2022-02-15 DIAGNOSIS — E871 Hypo-osmolality and hyponatremia: Secondary | ICD-10-CM

## 2022-02-15 DIAGNOSIS — D649 Anemia, unspecified: Secondary | ICD-10-CM | POA: Diagnosis not present

## 2022-02-15 DIAGNOSIS — E7800 Pure hypercholesterolemia, unspecified: Secondary | ICD-10-CM | POA: Diagnosis not present

## 2022-02-15 DIAGNOSIS — E1165 Type 2 diabetes mellitus with hyperglycemia: Secondary | ICD-10-CM | POA: Diagnosis not present

## 2022-02-15 LAB — CBC WITH DIFFERENTIAL/PLATELET
Basophils Absolute: 0.1 10*3/uL (ref 0.0–0.1)
Basophils Relative: 1 % (ref 0.0–3.0)
Eosinophils Absolute: 0.1 10*3/uL (ref 0.0–0.7)
Eosinophils Relative: 1.6 % (ref 0.0–5.0)
HCT: 36.5 % — ABNORMAL LOW (ref 39.0–52.0)
Hemoglobin: 12.1 g/dL — ABNORMAL LOW (ref 13.0–17.0)
Lymphocytes Relative: 17.1 % (ref 12.0–46.0)
Lymphs Abs: 1.3 10*3/uL (ref 0.7–4.0)
MCHC: 33.3 g/dL (ref 30.0–36.0)
MCV: 93.3 fl (ref 78.0–100.0)
Monocytes Absolute: 0.6 10*3/uL (ref 0.1–1.0)
Monocytes Relative: 8 % (ref 3.0–12.0)
Neutro Abs: 5.4 10*3/uL (ref 1.4–7.7)
Neutrophils Relative %: 72.3 % (ref 43.0–77.0)
Platelets: 359 10*3/uL (ref 150.0–400.0)
RBC: 3.91 Mil/uL — ABNORMAL LOW (ref 4.22–5.81)
RDW: 13.8 % (ref 11.5–15.5)
WBC: 7.5 10*3/uL (ref 4.0–10.5)

## 2022-02-15 LAB — LIPID PANEL
Cholesterol: 130 mg/dL (ref 0–200)
HDL: 49.9 mg/dL (ref >39.00)
LDL Cholesterol: 58 mg/dL (ref 0–99)
NonHDL: 80.27
Total CHOL/HDL Ratio: 3
Triglycerides: 110 mg/dL (ref 0.0–149.0)
VLDL: 22 mg/dL (ref 0.0–40.0)

## 2022-02-15 LAB — BASIC METABOLIC PANEL
BUN: 14 mg/dL (ref 6–23)
CO2: 27 mEq/L (ref 19–32)
Calcium: 9.3 mg/dL (ref 8.4–10.5)
Chloride: 95 mEq/L — ABNORMAL LOW (ref 96–112)
Creatinine, Ser: 0.7 mg/dL (ref 0.40–1.50)
GFR: 79.5 mL/min (ref >60.00)
Glucose, Bld: 156 mg/dL — ABNORMAL HIGH (ref 70–99)
Potassium: 4.6 mEq/L (ref 3.5–5.1)
Sodium: 132 mEq/L — ABNORMAL LOW (ref 135–145)

## 2022-02-15 LAB — HEPATIC FUNCTION PANEL
ALT: 13 U/L (ref 0–53)
AST: 16 U/L (ref 0–37)
Albumin: 4.2 g/dL (ref 3.5–5.2)
Alkaline Phosphatase: 40 U/L (ref 39–117)
Bilirubin, Direct: 0.2 mg/dL (ref 0.0–0.3)
Total Bilirubin: 0.9 mg/dL (ref 0.2–1.2)
Total Protein: 7.1 g/dL (ref 6.0–8.3)

## 2022-02-15 LAB — HEMOGLOBIN A1C: Hgb A1c MFr Bld: 7.2 % — ABNORMAL HIGH (ref 4.6–6.5)

## 2022-02-16 ENCOUNTER — Telehealth: Payer: Self-pay | Admitting: *Deleted

## 2022-02-16 DIAGNOSIS — M5459 Other low back pain: Secondary | ICD-10-CM | POA: Diagnosis not present

## 2022-02-16 DIAGNOSIS — M158 Other polyosteoarthritis: Secondary | ICD-10-CM | POA: Diagnosis not present

## 2022-02-16 DIAGNOSIS — M47816 Spondylosis without myelopathy or radiculopathy, lumbar region: Secondary | ICD-10-CM | POA: Diagnosis not present

## 2022-02-16 DIAGNOSIS — R293 Abnormal posture: Secondary | ICD-10-CM | POA: Diagnosis not present

## 2022-02-16 DIAGNOSIS — E871 Hypo-osmolality and hyponatremia: Secondary | ICD-10-CM

## 2022-02-16 NOTE — Telephone Encounter (Signed)
Pt called and I read the message to pt and he understood and scheduled lab appt ?

## 2022-02-16 NOTE — Telephone Encounter (Signed)
Left message call office please transfer to nurse.  ?

## 2022-02-16 NOTE — Telephone Encounter (Signed)
-----   Message from Einar Pheasant, MD sent at 02/16/2022  4:12 AM EDT ----- ?Notify [name] that his hgb is stable.  Sodium is slightly decreased.  Confirm he is not drinking an excessive amount of free water.  Also confirm eating regularly.  We will follow.  Recheck sodium in 10-14 days.  Overall sugar control improved from recent checks.  A1c 7.2.  Cholesterol levels look good.  Kidney function tests and liver function tests are wnl.  ?

## 2022-02-19 DIAGNOSIS — M158 Other polyosteoarthritis: Secondary | ICD-10-CM | POA: Diagnosis not present

## 2022-02-19 DIAGNOSIS — M47816 Spondylosis without myelopathy or radiculopathy, lumbar region: Secondary | ICD-10-CM | POA: Diagnosis not present

## 2022-02-19 DIAGNOSIS — M5459 Other low back pain: Secondary | ICD-10-CM | POA: Diagnosis not present

## 2022-02-19 DIAGNOSIS — R293 Abnormal posture: Secondary | ICD-10-CM | POA: Diagnosis not present

## 2022-02-22 DIAGNOSIS — M5459 Other low back pain: Secondary | ICD-10-CM | POA: Diagnosis not present

## 2022-02-22 DIAGNOSIS — R293 Abnormal posture: Secondary | ICD-10-CM | POA: Diagnosis not present

## 2022-02-22 DIAGNOSIS — M158 Other polyosteoarthritis: Secondary | ICD-10-CM | POA: Diagnosis not present

## 2022-02-22 DIAGNOSIS — M47816 Spondylosis without myelopathy or radiculopathy, lumbar region: Secondary | ICD-10-CM | POA: Diagnosis not present

## 2022-02-26 DIAGNOSIS — M47816 Spondylosis without myelopathy or radiculopathy, lumbar region: Secondary | ICD-10-CM | POA: Diagnosis not present

## 2022-02-26 DIAGNOSIS — R293 Abnormal posture: Secondary | ICD-10-CM | POA: Diagnosis not present

## 2022-02-26 DIAGNOSIS — M158 Other polyosteoarthritis: Secondary | ICD-10-CM | POA: Diagnosis not present

## 2022-02-26 DIAGNOSIS — M5459 Other low back pain: Secondary | ICD-10-CM | POA: Diagnosis not present

## 2022-02-28 ENCOUNTER — Ambulatory Visit (INDEPENDENT_AMBULATORY_CARE_PROVIDER_SITE_OTHER): Payer: Medicare Other

## 2022-02-28 DIAGNOSIS — E538 Deficiency of other specified B group vitamins: Secondary | ICD-10-CM | POA: Diagnosis not present

## 2022-02-28 DIAGNOSIS — E871 Hypo-osmolality and hyponatremia: Secondary | ICD-10-CM

## 2022-02-28 LAB — BASIC METABOLIC PANEL
BUN: 13 mg/dL (ref 6–23)
CO2: 25 mEq/L (ref 19–32)
Calcium: 9.2 mg/dL (ref 8.4–10.5)
Chloride: 98 mEq/L (ref 96–112)
Creatinine, Ser: 0.7 mg/dL (ref 0.40–1.50)
GFR: 79.48 mL/min (ref >60.00)
Glucose, Bld: 183 mg/dL — ABNORMAL HIGH (ref 70–99)
Potassium: 4.7 mEq/L (ref 3.5–5.1)
Sodium: 132 mEq/L — ABNORMAL LOW (ref 135–145)

## 2022-02-28 MED ORDER — CYANOCOBALAMIN 1000 MCG/ML IJ SOLN
1000.0000 ug | Freq: Once | INTRAMUSCULAR | Status: AC
  Administered 2022-02-28: 1000 ug via INTRAMUSCULAR

## 2022-02-28 NOTE — Progress Notes (Signed)
Patient presented for B 12 injection to right deltoid, patient voiced no concerns nor showed any signs of distress during injection. 

## 2022-03-01 ENCOUNTER — Other Ambulatory Visit: Payer: Self-pay

## 2022-03-01 ENCOUNTER — Telehealth: Payer: Self-pay

## 2022-03-01 DIAGNOSIS — M5459 Other low back pain: Secondary | ICD-10-CM | POA: Diagnosis not present

## 2022-03-01 DIAGNOSIS — M47816 Spondylosis without myelopathy or radiculopathy, lumbar region: Secondary | ICD-10-CM | POA: Diagnosis not present

## 2022-03-01 DIAGNOSIS — E871 Hypo-osmolality and hyponatremia: Secondary | ICD-10-CM

## 2022-03-01 DIAGNOSIS — R293 Abnormal posture: Secondary | ICD-10-CM | POA: Diagnosis not present

## 2022-03-01 DIAGNOSIS — M158 Other polyosteoarthritis: Secondary | ICD-10-CM | POA: Diagnosis not present

## 2022-03-01 NOTE — Telephone Encounter (Signed)
LMTCB for lab results.  

## 2022-03-01 NOTE — Telephone Encounter (Signed)
Pt returning call

## 2022-03-01 NOTE — Telephone Encounter (Signed)
See result note.  

## 2022-03-05 ENCOUNTER — Other Ambulatory Visit: Payer: Medicare Other

## 2022-03-07 DIAGNOSIS — M47816 Spondylosis without myelopathy or radiculopathy, lumbar region: Secondary | ICD-10-CM | POA: Diagnosis not present

## 2022-03-07 DIAGNOSIS — M158 Other polyosteoarthritis: Secondary | ICD-10-CM | POA: Diagnosis not present

## 2022-03-07 DIAGNOSIS — M5459 Other low back pain: Secondary | ICD-10-CM | POA: Diagnosis not present

## 2022-03-07 DIAGNOSIS — R293 Abnormal posture: Secondary | ICD-10-CM | POA: Diagnosis not present

## 2022-03-09 DIAGNOSIS — M158 Other polyosteoarthritis: Secondary | ICD-10-CM | POA: Diagnosis not present

## 2022-03-09 DIAGNOSIS — M47816 Spondylosis without myelopathy or radiculopathy, lumbar region: Secondary | ICD-10-CM | POA: Diagnosis not present

## 2022-03-09 DIAGNOSIS — R293 Abnormal posture: Secondary | ICD-10-CM | POA: Diagnosis not present

## 2022-03-09 DIAGNOSIS — M5459 Other low back pain: Secondary | ICD-10-CM | POA: Diagnosis not present

## 2022-03-12 DIAGNOSIS — M47816 Spondylosis without myelopathy or radiculopathy, lumbar region: Secondary | ICD-10-CM | POA: Diagnosis not present

## 2022-03-12 DIAGNOSIS — M158 Other polyosteoarthritis: Secondary | ICD-10-CM | POA: Diagnosis not present

## 2022-03-12 DIAGNOSIS — M5459 Other low back pain: Secondary | ICD-10-CM | POA: Diagnosis not present

## 2022-03-12 DIAGNOSIS — R293 Abnormal posture: Secondary | ICD-10-CM | POA: Diagnosis not present

## 2022-03-15 DIAGNOSIS — R293 Abnormal posture: Secondary | ICD-10-CM | POA: Diagnosis not present

## 2022-03-15 DIAGNOSIS — M47816 Spondylosis without myelopathy or radiculopathy, lumbar region: Secondary | ICD-10-CM | POA: Diagnosis not present

## 2022-03-15 DIAGNOSIS — M5459 Other low back pain: Secondary | ICD-10-CM | POA: Diagnosis not present

## 2022-03-15 DIAGNOSIS — M158 Other polyosteoarthritis: Secondary | ICD-10-CM | POA: Diagnosis not present

## 2022-03-16 ENCOUNTER — Other Ambulatory Visit (INDEPENDENT_AMBULATORY_CARE_PROVIDER_SITE_OTHER): Payer: Medicare Other

## 2022-03-16 DIAGNOSIS — E871 Hypo-osmolality and hyponatremia: Secondary | ICD-10-CM

## 2022-03-16 LAB — BASIC METABOLIC PANEL
BUN: 10 mg/dL (ref 6–23)
CO2: 28 mEq/L (ref 19–32)
Calcium: 9.3 mg/dL (ref 8.4–10.5)
Chloride: 98 mEq/L (ref 96–112)
Creatinine, Ser: 0.77 mg/dL (ref 0.40–1.50)
GFR: 77.2 mL/min (ref >60.00)
Glucose, Bld: 185 mg/dL — ABNORMAL HIGH (ref 70–99)
Potassium: 4.9 mEq/L (ref 3.5–5.1)
Sodium: 132 mEq/L — ABNORMAL LOW (ref 135–145)

## 2022-03-20 DIAGNOSIS — R293 Abnormal posture: Secondary | ICD-10-CM | POA: Diagnosis not present

## 2022-03-20 DIAGNOSIS — M5459 Other low back pain: Secondary | ICD-10-CM | POA: Diagnosis not present

## 2022-03-20 DIAGNOSIS — M47816 Spondylosis without myelopathy or radiculopathy, lumbar region: Secondary | ICD-10-CM | POA: Diagnosis not present

## 2022-03-20 DIAGNOSIS — M158 Other polyosteoarthritis: Secondary | ICD-10-CM | POA: Diagnosis not present

## 2022-03-21 DIAGNOSIS — H02054 Trichiasis without entropian left upper eyelid: Secondary | ICD-10-CM | POA: Diagnosis not present

## 2022-03-22 DIAGNOSIS — M47816 Spondylosis without myelopathy or radiculopathy, lumbar region: Secondary | ICD-10-CM | POA: Diagnosis not present

## 2022-03-22 DIAGNOSIS — M5459 Other low back pain: Secondary | ICD-10-CM | POA: Diagnosis not present

## 2022-03-22 DIAGNOSIS — R293 Abnormal posture: Secondary | ICD-10-CM | POA: Diagnosis not present

## 2022-03-22 DIAGNOSIS — M158 Other polyosteoarthritis: Secondary | ICD-10-CM | POA: Diagnosis not present

## 2022-03-29 DIAGNOSIS — M5459 Other low back pain: Secondary | ICD-10-CM | POA: Diagnosis not present

## 2022-03-29 DIAGNOSIS — R293 Abnormal posture: Secondary | ICD-10-CM | POA: Diagnosis not present

## 2022-03-29 DIAGNOSIS — M47816 Spondylosis without myelopathy or radiculopathy, lumbar region: Secondary | ICD-10-CM | POA: Diagnosis not present

## 2022-03-29 DIAGNOSIS — M158 Other polyosteoarthritis: Secondary | ICD-10-CM | POA: Diagnosis not present

## 2022-04-03 ENCOUNTER — Ambulatory Visit (INDEPENDENT_AMBULATORY_CARE_PROVIDER_SITE_OTHER): Payer: Medicare Other | Admitting: *Deleted

## 2022-04-03 DIAGNOSIS — E538 Deficiency of other specified B group vitamins: Secondary | ICD-10-CM | POA: Diagnosis not present

## 2022-04-04 MED ORDER — CYANOCOBALAMIN 1000 MCG/ML IJ SOLN
1000.0000 ug | Freq: Once | INTRAMUSCULAR | Status: AC
  Administered 2022-04-03: 1000 ug via INTRAMUSCULAR

## 2022-04-04 NOTE — Progress Notes (Signed)
Patient presented for B 12 injection to left deltoid, patient voiced no concerns nor showed any signs of distress during injection. 

## 2022-04-05 ENCOUNTER — Ambulatory Visit (INDEPENDENT_AMBULATORY_CARE_PROVIDER_SITE_OTHER): Payer: Medicare Other | Admitting: Podiatry

## 2022-04-05 ENCOUNTER — Encounter: Payer: Self-pay | Admitting: Podiatry

## 2022-04-05 DIAGNOSIS — M79675 Pain in left toe(s): Secondary | ICD-10-CM

## 2022-04-05 DIAGNOSIS — B351 Tinea unguium: Secondary | ICD-10-CM

## 2022-04-05 DIAGNOSIS — E1142 Type 2 diabetes mellitus with diabetic polyneuropathy: Secondary | ICD-10-CM | POA: Diagnosis not present

## 2022-04-05 DIAGNOSIS — Q828 Other specified congenital malformations of skin: Secondary | ICD-10-CM | POA: Diagnosis not present

## 2022-04-05 DIAGNOSIS — M79674 Pain in right toe(s): Secondary | ICD-10-CM

## 2022-04-05 NOTE — Progress Notes (Signed)
This patient returns to my office for at risk foot care.  This patient requires this care by a professional since this patient will be at risk due to having type 2 diabetes.    This patient is unable to cut nails himself since the patient cannot reach his nails.These nails are painful walking and wearing shoes.   This patient presents for at risk foot care today.  General Appearance  Alert, conversant and in no acute stress.  Vascular  Dorsalis pedis and posterior tibial  pulses are weakly  palpable  bilaterally.  Capillary return is within normal limits  bilaterally. Temperature is within normal limits  bilaterally.  Neurologic  Senn-Weinstein monofilament wire test diminished  bilaterally. Muscle power within normal limits bilaterally.  Nails Thick disfigured discolored nails with subungual debris  from hallux to fifth toes bilaterally. No evidence of bacterial infection or drainage bilaterally.  Orthopedic  No limitations of motion  feet .  No crepitus or effusions noted.  No bony pathology or digital deformities noted. Plantar flexed fifth metatarsal left foot.  Mild  HAV  Skin  normotropic skin with no porokeratosis noted bilaterally.  No signs of infections or ulcers noted.   Symptomatic callus sub 5th  Left.  Onychomycosis  Pain in right toes  Pain in left toes  Consent was obtained for treatment procedures.   Mechanical debridement of nails 1-5  bilaterally performed with a nail nipper.  Filed with dremel without incident. .  Debride porokeratosis with # 15 blade. .   Return office visit   3 months                  Told patient to return for periodic foot care and evaluation due to potential at risk complications.   Gardiner Barefoot DPM

## 2022-04-06 DIAGNOSIS — M158 Other polyosteoarthritis: Secondary | ICD-10-CM | POA: Diagnosis not present

## 2022-04-06 DIAGNOSIS — M47816 Spondylosis without myelopathy or radiculopathy, lumbar region: Secondary | ICD-10-CM | POA: Diagnosis not present

## 2022-04-06 DIAGNOSIS — R293 Abnormal posture: Secondary | ICD-10-CM | POA: Diagnosis not present

## 2022-04-06 DIAGNOSIS — M5459 Other low back pain: Secondary | ICD-10-CM | POA: Diagnosis not present

## 2022-04-11 DIAGNOSIS — M5459 Other low back pain: Secondary | ICD-10-CM | POA: Diagnosis not present

## 2022-04-11 DIAGNOSIS — R293 Abnormal posture: Secondary | ICD-10-CM | POA: Diagnosis not present

## 2022-04-11 DIAGNOSIS — M47816 Spondylosis without myelopathy or radiculopathy, lumbar region: Secondary | ICD-10-CM | POA: Diagnosis not present

## 2022-04-11 DIAGNOSIS — M158 Other polyosteoarthritis: Secondary | ICD-10-CM | POA: Diagnosis not present

## 2022-04-14 ENCOUNTER — Other Ambulatory Visit: Payer: Self-pay | Admitting: Internal Medicine

## 2022-04-16 DIAGNOSIS — M158 Other polyosteoarthritis: Secondary | ICD-10-CM | POA: Diagnosis not present

## 2022-04-16 DIAGNOSIS — M47816 Spondylosis without myelopathy or radiculopathy, lumbar region: Secondary | ICD-10-CM | POA: Diagnosis not present

## 2022-04-16 DIAGNOSIS — R293 Abnormal posture: Secondary | ICD-10-CM | POA: Diagnosis not present

## 2022-04-16 DIAGNOSIS — M5459 Other low back pain: Secondary | ICD-10-CM | POA: Diagnosis not present

## 2022-05-04 ENCOUNTER — Ambulatory Visit (INDEPENDENT_AMBULATORY_CARE_PROVIDER_SITE_OTHER): Payer: Medicare Other

## 2022-05-04 DIAGNOSIS — D649 Anemia, unspecified: Secondary | ICD-10-CM

## 2022-05-04 DIAGNOSIS — E1165 Type 2 diabetes mellitus with hyperglycemia: Secondary | ICD-10-CM

## 2022-05-04 DIAGNOSIS — I10 Essential (primary) hypertension: Secondary | ICD-10-CM

## 2022-05-04 DIAGNOSIS — E538 Deficiency of other specified B group vitamins: Secondary | ICD-10-CM

## 2022-05-04 DIAGNOSIS — E7800 Pure hypercholesterolemia, unspecified: Secondary | ICD-10-CM

## 2022-05-04 MED ORDER — CYANOCOBALAMIN 1000 MCG/ML IJ SOLN
1000.0000 ug | Freq: Once | INTRAMUSCULAR | Status: AC
  Administered 2022-05-04: 1000 ug via INTRAMUSCULAR

## 2022-05-04 NOTE — Progress Notes (Signed)
Patient presented for B 12 injection to left deltoid, patient voiced no concerns nor showed any signs of distress during injection.  Pt also request labs ordered so he could have done before his 4 month follow-up on 7/18. I have ordered for patient. He will be scheduled appointment today.

## 2022-05-10 ENCOUNTER — Encounter: Payer: Self-pay | Admitting: Internal Medicine

## 2022-05-10 ENCOUNTER — Other Ambulatory Visit: Payer: Self-pay

## 2022-05-10 MED ORDER — ACCU-CHEK GUIDE VI STRP: ORAL_STRIP | 5 refills | Status: DC

## 2022-05-16 ENCOUNTER — Encounter: Payer: Self-pay | Admitting: Dermatology

## 2022-05-16 ENCOUNTER — Ambulatory Visit (INDEPENDENT_AMBULATORY_CARE_PROVIDER_SITE_OTHER): Payer: Medicare Other | Admitting: Dermatology

## 2022-05-16 DIAGNOSIS — C44319 Basal cell carcinoma of skin of other parts of face: Secondary | ICD-10-CM | POA: Diagnosis not present

## 2022-05-16 DIAGNOSIS — L578 Other skin changes due to chronic exposure to nonionizing radiation: Secondary | ICD-10-CM | POA: Diagnosis not present

## 2022-05-16 DIAGNOSIS — L570 Actinic keratosis: Secondary | ICD-10-CM | POA: Diagnosis not present

## 2022-05-16 DIAGNOSIS — L820 Inflamed seborrheic keratosis: Secondary | ICD-10-CM | POA: Diagnosis not present

## 2022-05-16 NOTE — Progress Notes (Signed)
Follow-Up Visit   Subjective  [name] is a 88 y.o. male who presents for the following: Bx proven BCC (L brow - patient is here today for [facility] ). Pt noticed a crusted lesion on his scalp that he hits when he combs his hair. The patient has spots, moles and lesions to be evaluated, some may be new or changing and the patient has concerns that these could be cancer.  The following portions of the chart were reviewed this encounter and updated as appropriate:   Tobacco  Allergies  Meds  Problems  Med Hx  Surg Hx  Fam Hx     Review of Systems:  No other skin or systemic complaints except as noted in HPI or Assessment and Plan.  Objective  Well appearing patient in no apparent distress; mood and affect are within normal limits.  A focused examination was performed including the face. Relevant physical exam findings are noted in the Assessment and Plan.  L brow 1.0 cm pink biopsy site.  L nose x 1, L brow x 1, R prox nasal alar rim x 1 (3) Erythematous thin papules/macules with gritty scale.   Scalp x 1 Erythematous stuck-on, waxy papule or plaque   Assessment & Plan  Basal cell carcinoma (BCC) of skin of other part of face L eyebrow  Destruction of lesion - 1.4 cm Complexity: extensive   Destruction method: electrodesiccation and curettage   Informed consent: discussed and consent obtained   Timeout:  patient name, date of birth, surgical site, and procedure verified Procedure prep:  Patient was prepped and draped in usual sterile fashion Prep type:  Isopropyl alcohol Anesthesia: the lesion was anesthetized in a standard fashion   Anesthetic:  1% lidocaine w/ epinephrine 1-100,000 buffered w/ 8.4% NaHCO3 Curettage performed in three different directions: Yes   Electrodesiccation performed over the curetted area: Yes   Hemostasis achieved with:  pressure, aluminum chloride and electrodesiccation Outcome: patient tolerated procedure well with no complications    Post-procedure details: sterile dressing applied and wound care instructions given   Dressing type: bandage and petrolatum    AK (actinic keratosis) L nose x 1, L brow x 1, R prox nasal alar rim x 1  If R prox nasal alar rim still present will biopsy. Consider 5FU/Calcipotriene cream at follow up appointment.  Destruction of lesion - L nose x 1, L brow x 1, R prox nasal alar rim x 1 Complexity: simple   Destruction method: cryotherapy   Informed consent: discussed and consent obtained   Timeout:  patient name, date of birth, surgical site, and procedure verified Lesion destroyed using liquid nitrogen: Yes   Region frozen until ice ball extended beyond lesion: Yes   Outcome: patient tolerated procedure well with no complications   Post-procedure details: wound care instructions given    Inflamed seborrheic keratosis Scalp x 1 Destruction of lesion - Scalp x 1 Complexity: simple   Destruction method: cryotherapy   Informed consent: discussed and consent obtained   Timeout:  patient name, date of birth, surgical site, and procedure verified Lesion destroyed using liquid nitrogen: Yes   Region frozen until ice ball extended beyond lesion: Yes   Outcome: patient tolerated procedure well with no complications   Post-procedure details: wound care instructions given    Actinic Damage - chronic, secondary to cumulative UV radiation exposure/sun exposure over time - diffuse scaly erythematous macules with underlying dyspigmentation - Recommend daily broad spectrum sunscreen SPF 30+ to sun-exposed areas, reapply  every 2 hours as needed.  - Recommend staying in the shade or wearing long sleeves, sun glasses (UVA+UVB protection) and wide brim hats (4-inch brim around the entire circumference of the hat). - Call for new or changing lesions.  Return in about 6 weeks (around 06/27/2022) for recheck ISK's, AK, ED&C site .  [name], CMA, am acting as scribe for Sarina Ser, MD  . Documentation: I have reviewed the above documentation for accuracy and completeness, and I agree with the above.  Sarina Ser, MD

## 2022-05-16 NOTE — Patient Instructions (Addendum)
Electrodesiccation and Curettage ("Scrape and Burn") Wound Care Instructions  Leave the original bandage on for 24 hours if possible.  If the bandage becomes soaked or soiled before that time, it is OK to remove it and examine the wound.  A small amount of post-operative bleeding is normal.  If excessive bleeding occurs, remove the bandage, place gauze over the site and apply continuous pressure (no peeking) over the area for 30 minutes. If this does not work, please call our clinic as soon as possible or page your doctor if it is after hours.   Once a day, cleanse the wound with soap and water. It is fine to shower. If a thick crust develops you may use a Q-tip dipped into dilute hydrogen peroxide (mix 1:1 with water) to dissolve it.  Hydrogen peroxide can slow the healing process, so use it only as needed.    After washing, apply petroleum jelly (Vaseline) or an antibiotic ointment if your doctor prescribed one for you, followed by a bandage.    For best healing, the wound should be covered with a layer of ointment at all times. If you are not able to keep the area covered with a bandage to hold the ointment in place, this may mean re-applying the ointment several times a day.  Continue this wound care until the wound has healed and is no longer open. It may take several weeks for the wound to heal and close.  Itching and mild discomfort is normal during the healing process.  If you have any discomfort, you can take Tylenol (acetaminophen) or ibuprofen as directed on the bottle. (Please do not take these if you have an allergy to them or cannot take them for another reason).  Some redness, tenderness and white or yellow material in the wound is normal healing.  If the area becomes very sore and red, or develops a thick yellow-green material (pus), it may be infected; please notify us.    Wound healing continues for up to one year following surgery. It is not unusual to experience pain in the scar  from time to time during the interval.  If the pain becomes severe or the scar thickens, you should notify the office.    A slight amount of redness in a scar is expected for the first six months.  After six months, the redness will fade and the scar will soften and fade.  The color difference becomes less noticeable with time.  If there are any problems, return for a post-op surgery check at your earliest convenience.  To improve the appearance of the scar, you can use silicone scar gel, cream, or sheets (such as Mederma or Serica) every night for up to one year. These are available over the counter (without a prescription).  Please call our office at [phone] for any questions or concerns.     Due to recent changes in healthcare laws, you may see results of your pathology and/or laboratory studies on MyChart before the doctors have had a chance to review them. We understand that in some cases there may be results that are confusing or concerning to you. Please understand that not all results are received at the same time and often the doctors may need to interpret multiple results in order to provide you with the best plan of care or course of treatment. Therefore, we ask that you please give us 2 business days to thoroughly review all your results before contacting the office for clarification. Should   we see a critical lab result, you will be contacted sooner.   If You Need Anything After Your Visit  If you have any questions or concerns for your doctor, please call our main line at [phone] and press option 4 to reach your doctor's medical assistant. If no one answers, please leave a voicemail as directed and we will return your call as soon as possible. Messages left after 4 pm will be answered the following business day.   You may also send us a message via MyChart. We typically respond to MyChart messages within 1-2 business days.  For prescription refills, please ask your  pharmacy to contact our office. Our fax number is [phone].  If you have an urgent issue when the clinic is closed that cannot wait until the next business day, you can page your doctor at the number below.    Please note that while we do our best to be available for urgent issues outside of office hours, we are not available 24/7.   If you have an urgent issue and are unable to reach us, you may choose to seek medical care at your doctor's office, retail clinic, urgent care center, or emergency room.  If you have a medical emergency, please immediately call 911 or go to the emergency department.  Pager Numbers  - Dr. Kowalski: [phone]  - Dr. Moye: [phone]  - Dr. Stewart: [phone]  In the event of inclement weather, please call our main line at [phone] for an update on the status of any delays or closures.  Dermatology Medication Tips: Please keep the boxes that topical medications come in in order to help keep track of the instructions about where and how to use these. Pharmacies typically print the medication instructions only on the boxes and not directly on the medication tubes.   If your medication is too expensive, please contact our office at [phone] option 4 or send us a message through MyChart.   We are unable to tell what your co-pay for medications will be in advance as this is different depending on your insurance coverage. However, we may be able to find a substitute medication at lower cost or fill out paperwork to get insurance to cover a needed medication.   If a prior authorization is required to get your medication covered by your insurance company, please allow us 1-2 business days to complete this process.  Drug prices often vary depending on where the prescription is filled and some pharmacies may offer cheaper prices.  The website www.goodrx.com contains coupons for medications through different pharmacies. The prices here do not  account for what the cost may be with help from insurance (it may be cheaper with your insurance), but the website can give you the price if you did not use any insurance.  - You can print the associated coupon and take it with your prescription to the pharmacy.  - You may also stop by our office during regular business hours and pick up a GoodRx coupon card.  - If you need your prescription sent electronically to a different pharmacy, notify our office through [place] MyChart or by phone at [phone] option 4.     Si Usted Necesita Algo Despus de Su Visita  Tambin puede enviarnos un mensaje a travs de MyChart. Por lo general respondemos a los mensajes de MyChart en el transcurso de 1 a 2 das hbiles.  Para renovar recetas, por favor pida a su farmacia que se ponga en   contacto con nuestra oficina. Nuestro nmero de fax es el [phone].  Si tiene un asunto urgente cuando la clnica est cerrada y que no puede esperar hasta el siguiente da hbil, puede llamar/localizar a su doctor(a) al nmero que aparece a continuacin.   Por favor, tenga en cuenta que aunque hacemos todo lo posible para estar disponibles para asuntos urgentes fuera del horario de oficina, no estamos disponibles las 24 horas del da, los 7 das de la semana.   Si tiene un problema urgente y no puede comunicarse con nosotros, puede optar por buscar atencin mdica  en el consultorio de su doctor(a), en una clnica privada, en un centro de atencin urgente o en una sala de emergencias.  Si tiene una emergencia mdica, por favor llame inmediatamente al 911 o vaya a la sala de emergencias.  Nmeros de bper  - Dr. Kowalski: [phone]  - Dra. Moye: [phone]  - Dra. Stewart: [phone]  En caso de inclemencias del tiempo, por favor llame a nuestra lnea principal al [phone] para una actualizacin sobre el estado de cualquier retraso o cierre.  Consejos para la medicacin en dermatologa: Por  favor, guarde las cajas en las que vienen los medicamentos de uso tpico para ayudarle a seguir las instrucciones sobre dnde y cmo usarlos. Las farmacias generalmente imprimen las instrucciones del medicamento slo en las cajas y no directamente en los tubos del medicamento.   Si su medicamento es muy caro, por favor, pngase en contacto con nuestra oficina llamando al [phone] y presione la opcin 4 o envenos un mensaje a travs de MyChart.   No podemos decirle cul ser su copago por los medicamentos por adelantado ya que esto es diferente dependiendo de la cobertura de su seguro. Sin embargo, es posible que podamos encontrar un medicamento sustituto a menor costo o llenar un formulario para que el seguro cubra el medicamento que se considera necesario.   Si se requiere una autorizacin previa para que su compaa de seguros cubra su medicamento, por favor permtanos de 1 a 2 das hbiles para completar este proceso.  Los precios de los medicamentos varan con frecuencia dependiendo del lugar de dnde se surte la receta y alguna farmacias pueden ofrecer precios ms baratos.  El sitio web www.goodrx.com tiene cupones para medicamentos de diferentes farmacias. Los precios aqu no tienen en cuenta lo que podra costar con la ayuda del seguro (puede ser ms barato con su seguro), pero el sitio web puede darle el precio si no utiliz ningn seguro.  - Puede imprimir el cupn correspondiente y llevarlo con su receta a la farmacia.  - Tambin puede pasar por nuestra oficina durante el horario de atencin regular y recoger una tarjeta de cupones de GoodRx.  - Si necesita que su receta se enve electrnicamente a una farmacia diferente, informe a nuestra oficina a travs de MyChart de [place] o por telfono llamando al [phone] y presione la opcin 4.  

## 2022-05-18 ENCOUNTER — Other Ambulatory Visit (INDEPENDENT_AMBULATORY_CARE_PROVIDER_SITE_OTHER): Payer: Medicare Other

## 2022-05-18 DIAGNOSIS — E7800 Pure hypercholesterolemia, unspecified: Secondary | ICD-10-CM | POA: Diagnosis not present

## 2022-05-18 DIAGNOSIS — I10 Essential (primary) hypertension: Secondary | ICD-10-CM | POA: Diagnosis not present

## 2022-05-18 DIAGNOSIS — D649 Anemia, unspecified: Secondary | ICD-10-CM | POA: Diagnosis not present

## 2022-05-18 DIAGNOSIS — E1165 Type 2 diabetes mellitus with hyperglycemia: Secondary | ICD-10-CM | POA: Diagnosis not present

## 2022-05-18 LAB — LIPID PANEL
Cholesterol: 123 mg/dL (ref 0–200)
HDL: 47.3 mg/dL (ref >39.00)
LDL Cholesterol: 53 mg/dL (ref 0–99)
NonHDL: 75.33
Total CHOL/HDL Ratio: 3
Triglycerides: 113 mg/dL (ref 0.0–149.0)
VLDL: 22.6 mg/dL (ref 0.0–40.0)

## 2022-05-18 LAB — HEPATIC FUNCTION PANEL
ALT: 14 U/L (ref 0–53)
AST: 18 U/L (ref 0–37)
Albumin: 4 g/dL (ref 3.5–5.2)
Alkaline Phosphatase: 38 U/L — ABNORMAL LOW (ref 39–117)
Bilirubin, Direct: 0.2 mg/dL (ref 0.0–0.3)
Total Bilirubin: 0.8 mg/dL (ref 0.2–1.2)
Total Protein: 7.6 g/dL (ref 6.0–8.3)

## 2022-05-18 LAB — CBC WITH DIFFERENTIAL/PLATELET
Basophils Absolute: 0.1 10*3/uL (ref 0.0–0.1)
Basophils Relative: 1.2 % (ref 0.0–3.0)
Eosinophils Absolute: 0.1 10*3/uL (ref 0.0–0.7)
Eosinophils Relative: 2 % (ref 0.0–5.0)
HCT: 36 % — ABNORMAL LOW (ref 39.0–52.0)
Hemoglobin: 12 g/dL — ABNORMAL LOW (ref 13.0–17.0)
Lymphocytes Relative: 18.4 % (ref 12.0–46.0)
Lymphs Abs: 1.2 10*3/uL (ref 0.7–4.0)
MCHC: 33.4 g/dL (ref 30.0–36.0)
MCV: 92.2 fl (ref 78.0–100.0)
Monocytes Absolute: 0.6 10*3/uL (ref 0.1–1.0)
Monocytes Relative: 9.3 % (ref 3.0–12.0)
Neutro Abs: 4.7 10*3/uL (ref 1.4–7.7)
Neutrophils Relative %: 69.1 % (ref 43.0–77.0)
Platelets: 390 10*3/uL (ref 150.0–400.0)
RBC: 3.91 Mil/uL — ABNORMAL LOW (ref 4.22–5.81)
RDW: 13.2 % (ref 11.5–15.5)
WBC: 6.8 10*3/uL (ref 4.0–10.5)

## 2022-05-18 LAB — IBC + FERRITIN
Ferritin: 100.4 ng/mL (ref 22.0–322.0)
Iron: 79 ug/dL (ref 42–165)
Saturation Ratios: 25.1 % (ref 20.0–50.0)
TIBC: 315 ug/dL (ref 250.0–450.0)
Transferrin: 225 mg/dL (ref 212.0–360.0)

## 2022-05-18 LAB — BASIC METABOLIC PANEL
BUN: 11 mg/dL (ref 6–23)
CO2: 26 mEq/L (ref 19–32)
Calcium: 9.7 mg/dL (ref 8.4–10.5)
Chloride: 97 mEq/L (ref 96–112)
Creatinine, Ser: 0.71 mg/dL (ref 0.40–1.50)
GFR: 79.02 mL/min (ref >60.00)
Glucose, Bld: 141 mg/dL — ABNORMAL HIGH (ref 70–99)
Potassium: 4.2 mEq/L (ref 3.5–5.1)
Sodium: 132 mEq/L — ABNORMAL LOW (ref 135–145)

## 2022-05-18 LAB — HEMOGLOBIN A1C: Hgb A1c MFr Bld: 7 % — ABNORMAL HIGH (ref 4.6–6.5)

## 2022-05-18 LAB — MICROALBUMIN / CREATININE URINE RATIO
Creatinine,U: 61.7 mg/dL
Microalb Creat Ratio: 18.4 mg/g (ref 0.0–30.0)
Microalb, Ur: 11.4 mg/dL — ABNORMAL HIGH (ref 0.0–1.9)

## 2022-05-22 ENCOUNTER — Encounter: Payer: Self-pay | Admitting: Internal Medicine

## 2022-05-22 ENCOUNTER — Ambulatory Visit (INDEPENDENT_AMBULATORY_CARE_PROVIDER_SITE_OTHER): Payer: Medicare Other | Admitting: Internal Medicine

## 2022-05-22 VITALS — BP 134/62 | HR 70 | Temp 97.4°F | Resp 21 | Ht 70.0 in | Wt 222.0 lb

## 2022-05-22 DIAGNOSIS — E1165 Type 2 diabetes mellitus with hyperglycemia: Secondary | ICD-10-CM | POA: Diagnosis not present

## 2022-05-22 DIAGNOSIS — I10 Essential (primary) hypertension: Secondary | ICD-10-CM

## 2022-05-22 DIAGNOSIS — D649 Anemia, unspecified: Secondary | ICD-10-CM

## 2022-05-22 DIAGNOSIS — E871 Hypo-osmolality and hyponatremia: Secondary | ICD-10-CM

## 2022-05-22 DIAGNOSIS — E7800 Pure hypercholesterolemia, unspecified: Secondary | ICD-10-CM | POA: Diagnosis not present

## 2022-05-22 DIAGNOSIS — R079 Chest pain, unspecified: Secondary | ICD-10-CM | POA: Diagnosis not present

## 2022-05-22 DIAGNOSIS — G7000 Myasthenia gravis without (acute) exacerbation: Secondary | ICD-10-CM | POA: Diagnosis not present

## 2022-05-22 DIAGNOSIS — G479 Sleep disorder, unspecified: Secondary | ICD-10-CM

## 2022-05-22 NOTE — Progress Notes (Signed)
Patient ID: THEODIS KINSEL, male   DOB: Aug 17, 1926, 88 y.o.   MRN: [id number]   Subjective:    Patient ID: GILDARDO TICKNER, male    DOB: 02-11-1926, 88 y.o.   MRN: [id number]   Patient here for a scheduled follow up.   Chief Complaint  Patient presents with   Diabetes   Hypertension   .   HPI States he is doing relatively well.  Reviewed outside sugars, weights and blood pressures.  Blood pressures averaging 120-130s/60s.  Weight stable.  Blood sugars 120-130s.  No chest pain or sob reported.  No acid reflux.  No abdominal pain reported.  Sees Dr Diamantina [facility] - f/u prostate and urinary issues.  Some nocturia.  Having bowel movement daily.  If eats turnip greens - runny.  Taking senna S.  Discussed changing and not taking stimulant.  Taking melatonin.  Did aggravate right side chest moving a bed.  Movement aggravates.  Better now.  Takes tylenol.  Some phlegm - talking.  Robitussin.    Past Medical History:  Diagnosis Date   Actinic keratosis 02/01/2021   midline anterior chin    Allergic rhinitis    Basal cell carcinoma 01/29/2007   Right mid forehead. Excised: 03/24/2007, margins free.   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 Excised 07/27/2014, residual focus AFX, margins free.   Squamous cell carcinoma of skin 04/01/2007   Right forehead, 2cm above mid brow. WD SCC wtih superficial infiltration. Excised: 05/14/2007, margins free   Squamous cell carcinoma of skin 05/16/2016   Crown. WD SCC,  ulcerated. Excised: 07/03/2016, residual MD SCC, deep margin involved. Excised: 07/17/2016, margins free.   TIA (transient ischemic attack) 09/13/2015   Urinary outflow obstruction    mild   Past Surgical History:  Procedure Laterality Date   PARTIAL HIP ARTHROPLASTY  01/2019   PILONIDAL CYST EXCISION  1951   removal   ROTATOR CUFF REPAIR  1995   SKIN CANCER EXCISION     multiple   SQUAMOUS CELL CARCINOMA EXCISION     behind head   TONSILLECTOMY AND ADENOIDECTOMY  1938   Family History  Problem Relation Age of Onset   Heart disease Father        heart atack   Alzheimer's disease Mother    CVA Brother    Social History   Socioeconomic History   Marital status: Married    Spouse name: Not on file   Number of children: Not on file   Years of education: Not on file   Highest education level: Not on file  Occupational History   Not on file  Tobacco Use   Smoking status: Never   Smokeless tobacco: Never  Vaping Use   Vaping Use: Never used  Substance and Sexual Activity   Alcohol use: No    Alcohol/week: 0.0 standard drinks of alcohol   Drug use: No   Sexual activity: Not Currently    Birth control/protection: None  Other  Topics Concern   Not on file  Social History Narrative   Retired, married. Walks everyday         Social Determinants of Health   Financial Resource Strain: Low Risk  (06/02/2019)   Overall Financial Resource Strain (CARDIA)    Difficulty of Paying Living Expenses: Not hard at all  Food Insecurity: No Food Insecurity (06/02/2019)   Hunger Vital Sign    Worried About Running Out of Food in the Last Year: Never true    Ran Out of Food in the Last Year: Never true  Transportation Needs: No Transportation Needs (06/02/2019)   PRAPARE - Hydrologist (Medical): No    Lack of Transportation (Non-Medical): No  Physical Activity: Insufficiently Active (06/02/2019)   Exercise Vital Sign    Days of Exercise per Week: 2 days     Minutes of Exercise per Session: 60 min  Stress: No Stress Concern Present (06/02/2019)   [place]    Feeling of Stress : Not at all  Social Connections: Unknown (06/02/2019)   Social Connection and Isolation Panel [NHANES]    Frequency of Communication with Friends and Family: Not asked    Frequency of Social Gatherings with Friends and Family: Not on file    Attends Religious Services: Not on file    Active Member of Clubs or Organizations: Not on file    Attends Archivist Meetings: Not on file    Marital Status: Not on file     Review of Systems  Constitutional:  Negative for appetite change and unexpected weight change.  HENT:  Negative for congestion and sinus pressure.        Phlegm as outlined.   Respiratory:  Negative for cough, chest tightness and shortness of breath.   Cardiovascular:  Negative for chest pain, palpitations and leg swelling.  Gastrointestinal:  Negative for abdominal pain, diarrhea, nausea and vomiting.  Genitourinary:  Negative for difficulty urinating and dysuria.  Musculoskeletal:  Negative for joint swelling and myalgias.  Skin:  Negative for color change and rash.  Neurological:  Negative for dizziness, light-headedness and headaches.  Psychiatric/Behavioral:  Negative for agitation and dysphoric mood.        Objective:     BP 134/62 (BP Location: Left Arm, Patient Position: Sitting, Cuff Size: Normal)   Pulse 70   Temp (!) 97.4 F (36.3 C) (Oral)   Resp (!) 21   Ht '5\' 10"'  (1.778 m)   Wt 222 lb (100.7 kg)   SpO2 98%   BMI 31.85 kg/m  Wt Readings from Last 3 Encounters:  05/22/22 222 lb (100.7 kg)  01/18/22 225 lb (102.1 kg)  12/15/21 223 lb 3.2 oz (101.2 kg)    Physical Exam Vitals reviewed.  Constitutional:      General: He is not in acute distress.    Appearance: Normal appearance. He is well-developed.  HENT:     Head: Normocephalic and atraumatic.      Right Ear: External ear normal.     Left Ear: External ear normal.  Eyes:     General: No scleral icterus.       Right eye: No discharge.        Left eye: No discharge.     Conjunctiva/sclera: Conjunctivae normal.  Cardiovascular:     Rate and Rhythm: Normal rate and regular rhythm.  Pulmonary:     Effort: Pulmonary effort is normal. No respiratory distress.  Breath sounds: Normal breath sounds.  Abdominal:     General: Bowel sounds are normal.     Palpations: Abdomen is soft.     Tenderness: There is no abdominal tenderness.  Musculoskeletal:        General: No swelling or tenderness.     Cervical back: Neck supple. No tenderness.  Lymphadenopathy:     Cervical: No cervical adenopathy.  Skin:    Findings: No erythema or rash.  Neurological:     Mental Status: He is alert.  Psychiatric:        Mood and Affect: Mood normal.        Behavior: Behavior normal.      Outpatient Encounter Medications as of 05/22/2022  Medication Sig   Accu-Chek FastClix Lancets MISC USE 1  TO CHECK SUGARS ONCE DAILY. Dx E11.9   amLODipine (NORVASC) 10 MG tablet Take 1 tablet (10 mg total) by mouth daily.   aspirin 81 MG EC tablet Take 81 mg by mouth daily.   calcium-vitamin D (OSCAL WITH D) 500-200 MG-UNIT tablet Take 1 tablet by mouth 2 (two) times daily.   ferrous sulfate 325 (65 FE) MG EC tablet Take 325 mg by mouth 3 (three) times daily with meals.   finasteride (PROSCAR) 5 MG tablet TAKE 1 TABLET(5 MG) BY MOUTH DAILY   glucose blood (ACCU-CHEK GUIDE) test strip USE 1 STRIP TO CHECK GLUCOSE ONCE DAILY DX E 11.9   lovastatin (MEVACOR) 20 MG tablet TAKE 1 TABLET BY MOUTH AT BEDTIME   metFORMIN (GLUCOPHAGE-XR) 500 MG 24 hr tablet TAKE 1 TABLET BY MOUTH EVERY DAY WITH BREAKFAST   mometasone (ELOCON) 0.1 % cream Apply to rash on abdomen once or twice a day prn   mycophenolate (CELLCEPT) 500 MG tablet Take 1,000 mg by mouth 2 (two) times daily.   olmesartan (BENICAR) 40 MG tablet TAKE 1  TABLET(40 MG) BY MOUTH DAILY   tamsulosin (FLOMAX) 0.4 MG CAPS capsule TAKE 1 CAPSULE(0.4 MG) BY MOUTH DAILY AFTER SUPPER   furosemide (LASIX) 20 MG tablet Take 1 tablet (20 mg total) by mouth daily as needed. For shortness of breath and swelling   No facility-administered encounter medications on file as of 05/22/2022.     Lab Results  Component Value Date   WBC 6.8 05/18/2022   HGB 12.0 (L) 05/18/2022   HCT 36.0 (L) 05/18/2022   PLT 390.0 05/18/2022   GLUCOSE 141 (H) 05/18/2022   CHOL 123 05/18/2022   TRIG 113.0 05/18/2022   HDL 47.30 05/18/2022   LDLCALC 53 05/18/2022   ALT 14 05/18/2022   AST 18 05/18/2022   NA 132 (L) 05/18/2022   K 4.2 05/18/2022   CL 97 05/18/2022   CREATININE 0.71 05/18/2022   BUN 11 05/18/2022   CO2 26 05/18/2022   TSH 2.90 09/15/2021   PSA 0.02 (L) 04/16/2018   HGBA1C 7.0 (H) 05/18/2022   MICROALBUR 11.4 (H) 05/18/2022    MR LUMBAR SPINE WO CONTRAST  Addendum Date: 05/01/2018   ADDENDUM REPORT: 05/01/2018 17:12 ADDENDUM: Chronic T12, L2, L3, and L5 compression fractures. Electronically Signed   By: Logan Bores M.D.   On: 05/01/2018 17:12   Result Date: 05/01/2018 CLINICAL DATA:  Lumbar radiculopathy. Low back and left leg pain for couple of months. EXAM: MRI LUMBAR SPINE WITHOUT CONTRAST TECHNIQUE: Multiplanar, multisequence MR imaging of the lumbar spine was performed. No intravenous contrast was administered. COMPARISON:  None. FINDINGS: Segmentation:  Standard. Alignment: Mild lumbar levoscoliosis. 3 mm anterolisthesis of L3 on  L4. Vertebrae: T12, L2, L3, and L5 compression fractures with mild-to-moderate vertebral body height loss and no significant marrow edema. 3 mm retropulsion of the posterosuperior T12 vertebral body. Mild periarticular marrow and soft tissue edema associated with left L5-S1 facet arthritis. Conus medullaris and cauda equina: Conus extends to the lower L1 level. Conus and cauda equina appear normal. Paraspinal and other soft  tissues: Mild left lower lumbar posterior paraspinal muscle, possibly strain. Disc levels: Disc desiccation throughout the lower thoracic and lumbar spine. Mild disc space narrowing at L2-3 and L4-5. T11-12: Mild T12 superior endplate retropulsion, mild disc bulging, and mild facet arthrosis result in borderline to mild spinal stenosis without neural foraminal stenosis. T12-L1: Mild disc bulging and moderate facet hypertrophy without stenosis. L1-2: Mild circumferential disc bulging and moderate facet hypertrophy result in minimal right neural foraminal narrowing without spinal stenosis. L2-3: Circumferential disc bulging, a broad right subarticular to foraminal disc protrusion, and moderate facet hypertrophy result in minimal right lateral recess narrowing without spinal or neural foraminal stenosis. L3-4: Anterolisthesis with slight bulging of uncovered disc, mildly prominent dorsal epidural fat, and severe facet hypertrophy result in mild-to-moderate spinal stenosis, mild bilateral lateral recess stenosis, and mild bilateral neural foraminal stenosis. There is likely facet ankylosis bilaterally. L4-5: Disc bulging, mild ligamentum flavum hypertrophy, and moderate left greater than right facet hypertrophy result in mild spinal stenosis, mild-to-moderate left greater than right lateral recess stenosis, and minimal bilateral neural foraminal stenosis. L5-S1: Disc bulging and severe left greater than right facet arthrosis result in mild left lateral recess stenosis and mild right and severe left neural foraminal stenosis with potential left L5 nerve root impingement. There is a left facet joint effusion, and a 6 x 3 mm synovial cyst is present anterior to the left facet joint in close proximity to the exiting left L5 nerve at the lateral aspect of the foramen though without frank nerve compression. IMPRESSION: 1. Severe left L5-S1 facet arthritis with associated joint effusion and edema. Severe left neural foraminal  stenosis with potential left L5 nerve root impingement. 2. Mild-to-moderate spinal and lateral recess stenosis at L3-4 and L4-5. Electronically Signed: By: Logan Bores M.D. On: 05/01/2018 13:20       Assessment & Plan:   Problem List Items Addressed This Visit     Anemia    Follow cbc.      Relevant Orders   CBC w/Diff   Ferritin   Diabetes mellitus ([place])    a1c just checked 7.0.  Sugars as outlined.  Low carb diet and exercise.  Follow met b and a1c.        Relevant Orders   HgB A1c   Hypercholesterolemia - Primary    On lovastatin.  Low cholesterol diet and exercise.  Follow lipid panel and liver function tests.        Relevant Orders   Lipid Profile   Hepatic function panel   TSH   HYPERTENSION, BENIGN    On amlodipine and benicar.  Blood pressures on outside checks as outlined.  Overall improved.  Follow pressures.  Follow metabolic panel.       Relevant Orders   Basic Metabolic Panel (BMET)   Hyponatremia    Sodium stable 132.  Follow.       Myasthenia gravis ([place])    Followed by neurology.  Stable.  Continue cellcept.  Follow cbc.       Right-sided chest pain    Right side chest pain - occurred after moving bed.  Aggravated by movements.  Better now.  Follow.       Sleep difficulties    Taking melatonin q hs.          Einar Pheasant, MD

## 2022-05-23 ENCOUNTER — Encounter: Payer: Self-pay | Admitting: Dermatology

## 2022-05-27 ENCOUNTER — Encounter: Payer: Self-pay | Admitting: Internal Medicine

## 2022-05-27 DIAGNOSIS — R079 Chest pain, unspecified: Secondary | ICD-10-CM | POA: Insufficient documentation

## 2022-05-27 NOTE — Assessment & Plan Note (Signed)
Taking melatonin q hs.

## 2022-05-27 NOTE — Assessment & Plan Note (Signed)
a1c just checked 7.0.  Sugars as outlined.  Low carb diet and exercise.  Follow met b and a1c.

## 2022-05-27 NOTE — Assessment & Plan Note (Signed)
Follow cbc.  

## 2022-05-27 NOTE — Assessment & Plan Note (Signed)
Right side chest pain - occurred after moving bed.  Aggravated by movements.  Better now.  Follow.

## 2022-05-27 NOTE — Assessment & Plan Note (Signed)
Sodium stable 132.  Follow.

## 2022-05-27 NOTE — Assessment & Plan Note (Signed)
Followed by neurology.  Stable.  Continue cellcept.  Follow cbc.  

## 2022-05-27 NOTE — Assessment & Plan Note (Signed)
On lovastatin.  Low cholesterol diet and exercise.  Follow lipid panel and liver function tests.   

## 2022-05-27 NOTE — Assessment & Plan Note (Signed)
On amlodipine and benicar.  Blood pressures on outside checks as outlined.  Overall improved.  Follow pressures.  Follow metabolic panel.

## 2022-06-05 ENCOUNTER — Ambulatory Visit: Payer: Medicare Other

## 2022-06-06 ENCOUNTER — Ambulatory Visit (INDEPENDENT_AMBULATORY_CARE_PROVIDER_SITE_OTHER): Payer: Medicare Other

## 2022-06-06 DIAGNOSIS — E538 Deficiency of other specified B group vitamins: Secondary | ICD-10-CM | POA: Diagnosis not present

## 2022-06-06 MED ORDER — CYANOCOBALAMIN 1000 MCG/ML IJ SOLN
1000.0000 ug | Freq: Once | INTRAMUSCULAR | Status: AC
  Administered 2022-06-06: 1000 ug via INTRAMUSCULAR

## 2022-06-06 NOTE — Progress Notes (Signed)
Patient presented for B 12 injection to left deltoid, patient voiced no concerns nor showed any signs of distress during injection. 

## 2022-06-20 DIAGNOSIS — H02054 Trichiasis without entropian left upper eyelid: Secondary | ICD-10-CM | POA: Diagnosis not present

## 2022-06-21 ENCOUNTER — Ambulatory Visit (INDEPENDENT_AMBULATORY_CARE_PROVIDER_SITE_OTHER): Payer: Medicare Other | Admitting: Podiatry

## 2022-06-21 DIAGNOSIS — S90121A Contusion of right lesser toe(s) without damage to nail, initial encounter: Secondary | ICD-10-CM

## 2022-06-21 MED ORDER — DOXYCYCLINE HYCLATE 100 MG PO TABS: 100.0000 mg | ORAL_TABLET | Freq: Two times a day (BID) | ORAL | 0 refills | Status: DC

## 2022-06-21 NOTE — Progress Notes (Signed)
Subjective:  Patient ID: [name], male    DOB: Jan 05, 1926,  MRN: [id number]  Chief Complaint  Patient presents with   Foot Pain    Patient hit his right foot third toe on his walker at home and now has redness and swelling.    88 y.o. male presents with the above complaint.  Patient presents with complaint of right third digit nail contusion/toe contusion.  Patient states that it is not painful he is a diabetic with neuropathy.  He wanted get it evaluated some redness and swelling.  He is not taking antibiotics he hit his toe on a walker.  Does not hurt.  He wanted to just get it checked out because of diabetes does not want to lose the toe.   Review of Systems: Negative except as noted in the HPI. Denies N/V/F/Ch.  Past Medical History:  Diagnosis Date   Actinic keratosis 02/01/2021   midline anterior chin    Allergic rhinitis    Basal cell carcinoma 01/29/2007   Right mid forehead. Excised: 03/24/2007, margins free.   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 Excised 07/27/2014, residual focus AFX, margins free.   Squamous cell carcinoma of skin 04/01/2007   Right forehead, 2cm above mid brow. WD SCC wtih superficial infiltration. Excised: 05/14/2007, margins free   Squamous cell carcinoma of skin 05/16/2016   Crown. WD SCC, ulcerated. Excised: 07/03/2016, residual MD SCC, deep margin involved. Excised: 07/17/2016, margins free.   TIA (transient ischemic attack) 09/13/2015   Urinary outflow  obstruction    mild    Current Outpatient Medications:    Accu-Chek FastClix Lancets MISC, USE 1  TO CHECK SUGARS ONCE DAILY. Dx E11.9, Disp: 102 each, Rfl: 3   amLODipine (NORVASC) 10 MG tablet, Take 1 tablet (10 mg total) by mouth daily., Disp: 90 tablet, Rfl: 1   aspirin 81 MG EC tablet, Take 81 mg by mouth daily., Disp: , Rfl:    calcium-vitamin D (OSCAL WITH D) 500-200 MG-UNIT tablet, Take 1 tablet by mouth 2 (two) times daily., Disp: , Rfl:    doxycycline (VIBRA-TABS) 100 MG tablet, Take 1 tablet (100 mg total) by mouth 2 (two) times daily., Disp: 20 tablet, Rfl: 0   ferrous sulfate 325 (65 FE) MG EC tablet, Take 325 mg by mouth 3 (three) times daily with meals., Disp: , Rfl:    finasteride (PROSCAR) 5 MG tablet, TAKE 1 TABLET(5 MG) BY MOUTH DAILY, Disp: 90 tablet, Rfl: 3   glucose blood (ACCU-CHEK GUIDE) test strip, USE 1 STRIP TO CHECK GLUCOSE ONCE DAILY DX E 11.9, Disp: 50 each, Rfl: 5   lovastatin (MEVACOR) 20 MG tablet, TAKE 1 TABLET BY MOUTH AT BEDTIME, Disp: 90 tablet, Rfl: 0   metFORMIN (GLUCOPHAGE-XR) 500 MG 24 hr tablet, TAKE 1 TABLET BY MOUTH EVERY DAY WITH BREAKFAST, Disp: 90 tablet, Rfl: 3   mometasone (ELOCON) 0.1 % cream, Apply to rash on abdomen once or twice a day prn,  Disp: 45 g, Rfl: 1   mycophenolate (CELLCEPT) 500 MG tablet, Take 1,000 mg by mouth 2 (two) times daily., Disp: , Rfl:    olmesartan (BENICAR) 40 MG tablet, TAKE 1 TABLET(40 MG) BY MOUTH DAILY, Disp: 90 tablet, Rfl: 1   tamsulosin (FLOMAX) 0.4 MG CAPS capsule, TAKE 1 CAPSULE(0.4 MG) BY MOUTH DAILY AFTER SUPPER, Disp: 90 capsule, Rfl: 3   furosemide (LASIX) 20 MG tablet, Take 1 tablet (20 mg total) by mouth daily as needed. For shortness of breath and swelling, Disp: 90 tablet, Rfl: 3  Social History   Tobacco Use  Smoking Status Never  Smokeless Tobacco Never    Allergies  Allergen Reactions   Magnesium Other (See Comments)    Myasthenia gravis   Penicillamine Other (See Comments)    Myasthenia  gravis   Objective:  There were no vitals filed for this visit. There is no height or weight on file to calculate BMI. Constitutional Well developed. Well nourished.  Vascular Dorsalis pedis pulses palpable bilaterally. Posterior tibial pulses palpable bilaterally. Capillary refill normal to all digits.  No cyanosis or clubbing noted. Pedal hair growth normal.  Neurologic Normal speech. Oriented to person, place, and time. Epicritic sensation to light touch grossly present bilaterally.  Dermatologic Right third toe contusion with some ecchymosis and bruising.  No pain on palpation.  Some redness noted as well.  No detachment of the nail noted the nail is well adhered to the underlying nailbed.  Orthopedic: Normal joint ROM without pain or crepitus bilaterally. No visible deformities. No bony tenderness.   Radiographs: None Assessment:   1. Contusion of third toe of right foot, initial encounter    Plan:  Patient was evaluated and treated and all questions answered.  Right third toe contusion without damage to the nail -I will Russians and concerns were discussed with the patient in extensive detail given given the amount of swelling redness is present he will benefit from doxycycline advised him to take it for 10 days.  He states understanding will do so immediately if there is no improvement he will come back and see me right away.  He is a diabetic is a high risk of losing the toe.  At this time the nail is well adhered as well I will hold off on taking the nail off.  There is no signs of hematoma as well. No follow-ups on file.

## 2022-06-27 ENCOUNTER — Ambulatory Visit (INDEPENDENT_AMBULATORY_CARE_PROVIDER_SITE_OTHER): Payer: Medicare Other | Admitting: Dermatology

## 2022-06-27 DIAGNOSIS — L570 Actinic keratosis: Secondary | ICD-10-CM

## 2022-06-27 DIAGNOSIS — L821 Other seborrheic keratosis: Secondary | ICD-10-CM

## 2022-06-27 DIAGNOSIS — Z85828 Personal history of other malignant neoplasm of skin: Secondary | ICD-10-CM | POA: Diagnosis not present

## 2022-06-27 DIAGNOSIS — D692 Other nonthrombocytopenic purpura: Secondary | ICD-10-CM

## 2022-06-27 DIAGNOSIS — L578 Other skin changes due to chronic exposure to nonionizing radiation: Secondary | ICD-10-CM

## 2022-06-27 DIAGNOSIS — L820 Inflamed seborrheic keratosis: Secondary | ICD-10-CM | POA: Diagnosis not present

## 2022-06-27 NOTE — Patient Instructions (Addendum)
Cryotherapy Aftercare  Wash gently with soap and water everyday.   Apply Vaseline and Band-Aid daily until healed.     Due to recent changes in healthcare laws, you may see results of your pathology and/or laboratory studies on MyChart before the doctors have had a chance to review them. We understand that in some cases there may be results that are confusing or concerning to you. Please understand that not all results are received at the same time and often the doctors may need to interpret multiple results in order to provide you with the best plan of care or course of treatment. Therefore, we ask that you please give us 2 business days to thoroughly review all your results before contacting the office for clarification. Should we see a critical lab result, you will be contacted sooner.   If You Need Anything After Your Visit  If you have any questions or concerns for your doctor, please call our main line at [phone] and press option 4 to reach your doctor's medical assistant. If no one answers, please leave a voicemail as directed and we will return your call as soon as possible. Messages left after 4 pm will be answered the following business day.   You may also send us a message via MyChart. We typically respond to MyChart messages within 1-2 business days.  For prescription refills, please ask your pharmacy to contact our office. Our fax number is [phone].  If you have an urgent issue when the clinic is closed that cannot wait until the next business day, you can page your doctor at the number below.    Please note that while we do our best to be available for urgent issues outside of office hours, we are not available 24/7.   If you have an urgent issue and are unable to reach us, you may choose to seek medical care at your doctor's office, retail clinic, urgent care center, or emergency room.  If you have a medical emergency, please immediately call 911 or go to the  emergency department.  Pager Numbers  - Dr. Kowalski: [phone]  - Dr. Moye: [phone]  - Dr. Stewart: [phone]  In the event of inclement weather, please call our main line at [phone] for an update on the status of any delays or closures.  Dermatology Medication Tips: Please keep the boxes that topical medications come in in order to help keep track of the instructions about where and how to use these. Pharmacies typically print the medication instructions only on the boxes and not directly on the medication tubes.   If your medication is too expensive, please contact our office at [phone] option 4 or send us a message through MyChart.   We are unable to tell what your co-pay for medications will be in advance as this is different depending on your insurance coverage. However, we may be able to find a substitute medication at lower cost or fill out paperwork to get insurance to cover a needed medication.   If a prior authorization is required to get your medication covered by your insurance company, please allow us 1-2 business days to complete this process.  Drug prices often vary depending on where the prescription is filled and some pharmacies may offer cheaper prices.  The website www.goodrx.com contains coupons for medications through different pharmacies. The prices here do not account for what the cost may be with help from insurance (it may be cheaper with your insurance), but the website can   give you the price if you did not use any insurance.  - You can print the associated coupon and take it with your prescription to the pharmacy.  - You may also stop by our office during regular business hours and pick up a GoodRx coupon card.  - If you need your prescription sent electronically to a different pharmacy, notify our office through [facility] MyChart or by phone at [phone] option 4.     Si Usted Necesita Algo Despus de Su Visita  Tambin puede  enviarnos un mensaje a travs de MyChart. Por lo general respondemos a los mensajes de MyChart en el transcurso de 1 a 2 das hbiles.  Para renovar recetas, por favor pida a su farmacia que se ponga en contacto con nuestra oficina. Nuestro nmero de fax es el [phone].  Si tiene un asunto urgente cuando la clnica est cerrada y que no puede esperar hasta el siguiente da hbil, puede llamar/localizar a su doctor(a) al nmero que aparece a continuacin.   Por favor, tenga en cuenta que aunque hacemos todo lo posible para estar disponibles para asuntos urgentes fuera del horario de oficina, no estamos disponibles las 24 horas del da, los 7 das de la semana.   Si tiene un problema urgente y no puede comunicarse con nosotros, puede optar por buscar atencin mdica  en el consultorio de su doctor(a), en una clnica privada, en un centro de atencin urgente o en una sala de emergencias.  Si tiene una emergencia mdica, por favor llame inmediatamente al 911 o vaya a la sala de emergencias.  Nmeros de bper  - Dr. Kowalski: [phone]  - Dra. Moye: [phone]  - Dra. Stewart: [phone]  En caso de inclemencias del tiempo, por favor llame a nuestra lnea principal al [phone] para una actualizacin sobre el estado de cualquier retraso o cierre.  Consejos para la medicacin en dermatologa: Por favor, guarde las cajas en las que vienen los medicamentos de uso tpico para ayudarle a seguir las instrucciones sobre dnde y cmo usarlos. Las farmacias generalmente imprimen las instrucciones del medicamento slo en las cajas y no directamente en los tubos del medicamento.   Si su medicamento es muy caro, por favor, pngase en contacto con nuestra oficina llamando al [phone] y presione la opcin 4 o envenos un mensaje a travs de MyChart.   No podemos decirle cul ser su copago por los medicamentos por adelantado ya que esto es diferente dependiendo de la cobertura de su seguro.  Sin embargo, es posible que podamos encontrar un medicamento sustituto a menor costo o llenar un formulario para que el seguro cubra el medicamento que se considera necesario.   Si se requiere una autorizacin previa para que su compaa de seguros cubra su medicamento, por favor permtanos de 1 a 2 das hbiles para completar este proceso.  Los precios de los medicamentos varan con frecuencia dependiendo del lugar de dnde se surte la receta y alguna farmacias pueden ofrecer precios ms baratos.  El sitio web www.goodrx.com tiene cupones para medicamentos de diferentes farmacias. Los precios aqu no tienen en cuenta lo que podra costar con la ayuda del seguro (puede ser ms barato con su seguro), pero el sitio web puede darle el precio si no utiliz ningn seguro.  - Puede imprimir el cupn correspondiente y llevarlo con su receta a la farmacia.  - Tambin puede pasar por nuestra oficina durante el horario de atencin regular y recoger una tarjeta de cupones de GoodRx.  -   Si necesita que su receta se enve electrnicamente a una farmacia diferente, informe a nuestra oficina a travs de MyChart de [facility] o por telfono llamando al [phone] y presione la opcin 4.  

## 2022-06-27 NOTE — Progress Notes (Signed)
Follow-Up Visit   Subjective  [name] is a 88 y.o. male who presents for the following: Follow-up (6 weeks f/u on precancers on the face ). 6 weeks f/u BCC treated on the left eyebrow 05/16/2022 The patient has spots, moles and lesions to be evaluated, some may be new or changing and the patient has concerns that these could be cancer.  The following portions of the chart were reviewed this encounter and updated as appropriate:   Tobacco  Allergies  Meds  Problems  Med Hx  Surg Hx  Fam Hx     Review of Systems:  No other skin or systemic complaints except as noted in HPI or Assessment and Plan.  Objective  Well appearing patient in no apparent distress; mood and affect are within normal limits.  A focused examination was performed including face,scalp. Relevant physical exam findings are noted in the Assessment and Plan.  Scalp x 1 Stuck-on, waxy, tan-brown papules--Discussed benign etiology and prognosis.   face x 5 (5) Erythematous thin papules/macules with gritty scale.    Assessment & Plan  Inflamed seborrheic keratosis Scalp x 1  Symptomatic, irritating, patient would like treated.   Destruction of lesion - Scalp x 1 Complexity: simple   Destruction method: cryotherapy   Informed consent: discussed and consent obtained   Timeout:  patient name, date of birth, surgical site, and procedure verified Lesion destroyed using liquid nitrogen: Yes   Region frozen until ice ball extended beyond lesion: Yes   Outcome: patient tolerated procedure well with no complications   Post-procedure details: wound care instructions given    AK (actinic keratosis) (5) face x 5  Actinic keratoses are precancerous spots that appear secondary to cumulative UV radiation exposure/sun exposure over time. They are chronic with expected duration over 1 year. A portion of actinic keratoses will progress to squamous cell carcinoma of the skin. It is not possible to reliably  predict which spots will progress to skin cancer and so treatment is recommended to prevent development of skin cancer.  Recommend daily broad spectrum sunscreen SPF 30+ to sun-exposed areas, reapply every 2 hours as needed.  Recommend staying in the shade or wearing long sleeves, sun glasses (UVA+UVB protection) and wide brim hats (4-inch brim around the entire circumference of the hat). Call for new or changing lesions.   Destruction of lesion - face x 5 Complexity: simple   Destruction method: cryotherapy   Informed consent: discussed and consent obtained   Timeout:  patient name, date of birth, surgical site, and procedure verified Lesion destroyed using liquid nitrogen: Yes   Region frozen until ice ball extended beyond lesion: Yes   Outcome: patient tolerated procedure well with no complications   Post-procedure details: wound care instructions given    Seborrheic Keratoses - Stuck-on, waxy, tan-brown papules and/or plaques  - Benign-appearing - Discussed benign etiology and prognosis. - Observe - Call for any changes  Purpura - Chronic; persistent and recurrent.  Treatable, but not curable. - Violaceous macules and patches - Benign - Related to trauma, age, sun damage and/or use of blood thinners, chronic use of topical and/or oral steroids - Observe - Can use OTC arnica containing moisturizer such as Dermend Bruise Formula if desired - Call for worsening or other concerns   History of Basal Cell Carcinoma of the Skin Left eyebrow 12/04/2021 - No evidence of recurrence today - Recommend regular full body skin exams - Recommend daily broad spectrum sunscreen SPF 30+ to sun-exposed areas,  reapply every 2 hours as needed.  - Call if any new or changing lesions are noted between office visits   Return in about 6 months (around 12/28/2022) for hx of BCC, hx of Aks, hx of ISK's .  IMarye Round, CMA, am acting as scribe for Sarina Ser, MD .  Documentation: I have  reviewed the above documentation for accuracy and completeness, and I agree with the above.  Sarina Ser, MD

## 2022-06-29 ENCOUNTER — Encounter: Payer: Self-pay | Admitting: Dermatology

## 2022-07-10 ENCOUNTER — Other Ambulatory Visit: Payer: Self-pay | Admitting: Internal Medicine

## 2022-07-10 ENCOUNTER — Ambulatory Visit (INDEPENDENT_AMBULATORY_CARE_PROVIDER_SITE_OTHER): Payer: Medicare Other

## 2022-07-10 DIAGNOSIS — E538 Deficiency of other specified B group vitamins: Secondary | ICD-10-CM

## 2022-07-10 MED ORDER — CYANOCOBALAMIN 1000 MCG/ML IJ SOLN
1000.0000 ug | Freq: Once | INTRAMUSCULAR | Status: AC
  Administered 2022-07-10: 1000 ug via INTRAMUSCULAR

## 2022-07-10 NOTE — Progress Notes (Signed)
Pt arrived for B12 injection, given in L deltoid. Pt tolerated injection well, showed no signs of distress nor voiced any concerns.  ?

## 2022-07-12 ENCOUNTER — Ambulatory Visit: Payer: Medicare Other | Admitting: Podiatry

## 2022-07-17 ENCOUNTER — Ambulatory Visit (INDEPENDENT_AMBULATORY_CARE_PROVIDER_SITE_OTHER): Payer: Medicare Other | Admitting: Podiatry

## 2022-07-17 DIAGNOSIS — S90121A Contusion of right lesser toe(s) without damage to nail, initial encounter: Secondary | ICD-10-CM

## 2022-07-17 NOTE — Progress Notes (Signed)
Subjective:  Patient ID: [name], male    DOB: 11-Jan-1926,  MRN: [id number]  Chief Complaint  Patient presents with   Toe Pain    88 y.o. male presents with the above complaint.  Pain presents for follow-up to right third digit nail contusion.  He states he is doing a lot better.  No pain the antibiotics helped.  He denies any other acute complaints   Review of Systems: Negative except as noted in the HPI. Denies N/V/F/Ch.  Past Medical History:  Diagnosis Date   Actinic keratosis 02/01/2021   midline anterior chin    Allergic rhinitis    Basal cell carcinoma 01/29/2007   Right mid forehead. Excised: 03/24/2007, margins free.   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 Excised 07/27/2014, residual focus AFX, margins free.   Squamous cell carcinoma of skin 04/01/2007   Right forehead, 2cm above mid brow. WD SCC wtih superficial infiltration. Excised: 05/14/2007, margins free   Squamous cell carcinoma of skin 05/16/2016   Crown. WD SCC, ulcerated. Excised: 07/03/2016, residual MD SCC, deep margin involved. Excised: 07/17/2016, margins free.   TIA (transient ischemic attack) 09/13/2015   Urinary outflow obstruction    mild    Current Outpatient Medications:    Accu-Chek FastClix Lancets MISC, USE 1  TO CHECK SUGARS ONCE DAILY. Dx E11.9, Disp: 102 each, Rfl: 3   amLODipine (NORVASC) 10 MG tablet, Take 1 tablet (10 mg total) by mouth daily., Disp: 90 tablet, Rfl: 1   aspirin 81 MG EC tablet,  Take 81 mg by mouth daily., Disp: , Rfl:    calcium-vitamin D (OSCAL WITH D) 500-200 MG-UNIT tablet, Take 1 tablet by mouth 2 (two) times daily., Disp: , Rfl:    doxycycline (VIBRA-TABS) 100 MG tablet, Take 1 tablet (100 mg total) by mouth 2 (two) times daily., Disp: 20 tablet, Rfl: 0   ferrous sulfate 325 (65 FE) MG EC tablet, Take 325 mg by mouth 3 (three) times daily with meals., Disp: , Rfl:    finasteride (PROSCAR) 5 MG tablet, TAKE 1 TABLET(5 MG) BY MOUTH DAILY, Disp: 90 tablet, Rfl: 3   furosemide (LASIX) 20 MG tablet, Take 1 tablet (20 mg total) by mouth daily as needed. For shortness of breath and swelling, Disp: 90 tablet, Rfl: 3   glucose blood (ACCU-CHEK GUIDE) test strip, USE 1 STRIP TO CHECK GLUCOSE ONCE DAILY DX E 11.9, Disp: 50 each, Rfl: 5   lovastatin (MEVACOR) 20 MG tablet, TAKE 1 TABLET BY MOUTH AT BEDTIME, Disp: 90 tablet, Rfl: 0   metFORMIN (GLUCOPHAGE-XR) 500 MG 24 hr tablet, TAKE 1 TABLET BY MOUTH EVERY DAY WITH BREAKFAST, Disp: 90 tablet, Rfl: 3   mometasone (ELOCON) 0.1 % cream, Apply to rash on abdomen once or twice a day prn, Disp: 45 g, Rfl: 1   mycophenolate (CELLCEPT) 500 MG tablet, Take 1,000 mg by mouth 2 (two) times daily., Disp: , Rfl:    olmesartan (BENICAR) 40 MG  tablet, TAKE 1 TABLET(40 MG) BY MOUTH DAILY, Disp: 90 tablet, Rfl: 1   tamsulosin (FLOMAX) 0.4 MG CAPS capsule, TAKE 1 CAPSULE(0.4 MG) BY MOUTH DAILY AFTER SUPPER, Disp: 90 capsule, Rfl: 3  Social History   Tobacco Use  Smoking Status Never  Smokeless Tobacco Never    Allergies  Allergen Reactions   Magnesium Other (See Comments)    Myasthenia gravis   Penicillamine Other (See Comments)    Myasthenia gravis   Objective:  There were no vitals filed for this visit. There is no height or weight on file to calculate BMI. Constitutional Well developed. Well nourished.  Vascular Dorsalis pedis pulses palpable bilaterally. Posterior tibial pulses palpable bilaterally. Capillary refill normal  to all digits.  No cyanosis or clubbing noted. Pedal hair growth normal.  Neurologic Normal speech. Oriented to person, place, and time. Epicritic sensation to light touch grossly present bilaterally.  Dermatologic Right third toe contusion.  No further ecchymosis or bruising noted.  No pain on palpation.  No further redness edness noted as well.  No detachment of the nail noted the nail is well adhered to the underlying nailbed.  Orthopedic: Normal joint ROM without pain or crepitus bilaterally. No visible deformities. No bony tenderness.   Radiographs: None Assessment:   No diagnosis found.  Plan:  Patient was evaluated and treated and all questions answered.  Right third toe contusion without damage to the nail -Clinically healed and no further redness or erythema noted.  At this time I discussed with the patient to continue clinically monitoring if anything worsens to come back and see me I discussed shoe gear modification if any foot and ankle issues arise in the future he will come back and see me. No follow-ups on file.

## 2022-07-18 ENCOUNTER — Encounter: Payer: Self-pay | Admitting: Internal Medicine

## 2022-07-19 NOTE — Telephone Encounter (Signed)
If increased pain, needs to be reevaluated.  Needs an appt - can go to [facility] if increased pain.

## 2022-07-20 DIAGNOSIS — M545 Low back pain, unspecified: Secondary | ICD-10-CM | POA: Diagnosis not present

## 2022-07-20 NOTE — Telephone Encounter (Signed)
Spoke to Patient and gave him Dr. Bary Leriche recommendation of going to [facility] walk in. Patient stated he will go over there and get checked due to the pain being considerably worse.

## 2022-07-23 DIAGNOSIS — M5416 Radiculopathy, lumbar region: Secondary | ICD-10-CM | POA: Diagnosis not present

## 2022-07-23 DIAGNOSIS — M5136 Other intervertebral disc degeneration, lumbar region: Secondary | ICD-10-CM | POA: Diagnosis not present

## 2022-07-25 DIAGNOSIS — M48062 Spinal stenosis, lumbar region with neurogenic claudication: Secondary | ICD-10-CM | POA: Diagnosis not present

## 2022-07-25 DIAGNOSIS — M5416 Radiculopathy, lumbar region: Secondary | ICD-10-CM | POA: Diagnosis not present

## 2022-07-27 ENCOUNTER — Telehealth: Payer: Self-pay | Admitting: Internal Medicine

## 2022-07-27 ENCOUNTER — Other Ambulatory Visit: Payer: Self-pay | Admitting: Family Medicine

## 2022-07-27 ENCOUNTER — Other Ambulatory Visit: Payer: Self-pay

## 2022-07-27 DIAGNOSIS — M5416 Radiculopathy, lumbar region: Secondary | ICD-10-CM

## 2022-07-27 DIAGNOSIS — M5136 Other intervertebral disc degeneration, lumbar region: Secondary | ICD-10-CM | POA: Diagnosis not present

## 2022-07-27 DIAGNOSIS — M48062 Spinal stenosis, lumbar region with neurogenic claudication: Secondary | ICD-10-CM | POA: Diagnosis not present

## 2022-07-27 DIAGNOSIS — M4807 Spinal stenosis, lumbosacral region: Secondary | ICD-10-CM | POA: Diagnosis not present

## 2022-07-27 MED ORDER — AMLODIPINE BESYLATE 10 MG PO TABS: 10.0000 mg | ORAL_TABLET | Freq: Every day | ORAL | 1 refills | Status: DC

## 2022-07-27 MED ORDER — VALACYCLOVIR HCL 1 G PO TABS: 1000.0000 mg | ORAL_TABLET | Freq: Three times a day (TID) | ORAL | 0 refills | Status: AC

## 2022-07-27 NOTE — Telephone Encounter (Signed)
Per Pharmacy RX was sent in for 21 tablets to take 1 3 times daily for 20 days, would like to clarify if it needs to be 30 tablets instead?

## 2022-07-27 NOTE — Telephone Encounter (Signed)
Sorry. It needs to be valtrex '1000mg'$  tid x 1 week.

## 2022-07-27 NOTE — Telephone Encounter (Signed)
Received call from [facility] Dr Sharlet [place]. Pt with shingles.  Will send in valtrex.  Rx sent in for valtrex.

## 2022-07-27 NOTE — Telephone Encounter (Addendum)
Pharmacist advised, tried calling patient x 2 but not able to get an answer

## 2022-07-27 NOTE — Telephone Encounter (Signed)
Patient called and said that pharmacy did receive prescription but there is not enough information, please clarify and resend prescription.

## 2022-07-27 NOTE — Telephone Encounter (Signed)
Patient advised.

## 2022-07-29 ENCOUNTER — Ambulatory Visit
Admission: RE | Admit: 2022-07-29 | Discharge: 2022-07-29 | Disposition: A | Discharge status: Home / Self Care | Payer: Medicare Other | Source: Ambulatory Visit | Attending: Family Medicine | Admitting: Family Medicine

## 2022-07-29 DIAGNOSIS — M5416 Radiculopathy, lumbar region: Secondary | ICD-10-CM

## 2022-07-29 DIAGNOSIS — M545 Low back pain, unspecified: Secondary | ICD-10-CM | POA: Diagnosis not present

## 2022-08-01 ENCOUNTER — Encounter: Payer: Self-pay | Admitting: Internal Medicine

## 2022-08-01 MED ORDER — GABAPENTIN 100 MG PO CAPS: 100.0000 mg | ORAL_CAPSULE | Freq: Every day | ORAL | 1 refills | Status: DC

## 2022-08-01 NOTE — Telephone Encounter (Signed)
Spoke to [employer]. Discussed [name]'s concern with increased pain.  Will start gabapentin '100mg'$  q hs.  Discussed with [name]. Discussed possible side effects.  Will call with update.

## 2022-08-01 NOTE — Telephone Encounter (Signed)
[facility] prescribed Gabapentin 100 mg to him on Friday 07/27/22 . He stated he does not need the prescription called to the pharmacy.

## 2022-08-01 NOTE — Telephone Encounter (Signed)
Please notify him that I have already sent in the prescription for gabapentin.  Can cancel rx if he does not need.

## 2022-08-02 NOTE — Telephone Encounter (Signed)
I called pharmacy and cancelled the Gabapentin that the provider ordered because the patient already had a script for this medication sent from another provider.  [name],cma

## 2022-08-09 ENCOUNTER — Ambulatory Visit: Payer: Medicare Other

## 2022-08-11 ENCOUNTER — Other Ambulatory Visit: Payer: Self-pay | Admitting: Internal Medicine

## 2022-08-11 DIAGNOSIS — I10 Essential (primary) hypertension: Secondary | ICD-10-CM

## 2022-08-12 NOTE — Telephone Encounter (Signed)
See if he would be agreeable to have an appt with me on 08/23/22 and we can do b12, injections - same day.

## 2022-08-15 NOTE — Telephone Encounter (Signed)
Pt scheduled 08/23/22.

## 2022-08-22 ENCOUNTER — Ambulatory Visit: Payer: Medicare Other

## 2022-08-22 DIAGNOSIS — M5416 Radiculopathy, lumbar region: Secondary | ICD-10-CM | POA: Diagnosis not present

## 2022-08-22 DIAGNOSIS — R29898 Other symptoms and signs involving the musculoskeletal system: Secondary | ICD-10-CM | POA: Diagnosis not present

## 2022-08-22 DIAGNOSIS — M48062 Spinal stenosis, lumbar region with neurogenic claudication: Secondary | ICD-10-CM | POA: Diagnosis not present

## 2022-08-22 DIAGNOSIS — M5136 Other intervertebral disc degeneration, lumbar region: Secondary | ICD-10-CM | POA: Diagnosis not present

## 2022-08-23 ENCOUNTER — Encounter: Payer: Self-pay | Admitting: Internal Medicine

## 2022-08-23 ENCOUNTER — Ambulatory Visit (INDEPENDENT_AMBULATORY_CARE_PROVIDER_SITE_OTHER): Payer: Medicare Other | Admitting: Internal Medicine

## 2022-08-23 VITALS — BP 135/65 | HR 73 | Temp 98.0°F | Ht 70.0 in | Wt 283.4 lb

## 2022-08-23 DIAGNOSIS — I10 Essential (primary) hypertension: Secondary | ICD-10-CM

## 2022-08-23 DIAGNOSIS — E871 Hypo-osmolality and hyponatremia: Secondary | ICD-10-CM | POA: Diagnosis not present

## 2022-08-23 DIAGNOSIS — E538 Deficiency of other specified B group vitamins: Secondary | ICD-10-CM

## 2022-08-23 DIAGNOSIS — E1165 Type 2 diabetes mellitus with hyperglycemia: Secondary | ICD-10-CM

## 2022-08-23 DIAGNOSIS — E7800 Pure hypercholesterolemia, unspecified: Secondary | ICD-10-CM

## 2022-08-23 DIAGNOSIS — G7000 Myasthenia gravis without (acute) exacerbation: Secondary | ICD-10-CM

## 2022-08-23 DIAGNOSIS — M5416 Radiculopathy, lumbar region: Secondary | ICD-10-CM | POA: Diagnosis not present

## 2022-08-23 DIAGNOSIS — D649 Anemia, unspecified: Secondary | ICD-10-CM

## 2022-08-23 LAB — LIPID PANEL
Cholesterol: 112 mg/dL (ref 0–200)
HDL: 45.5 mg/dL (ref >39.00)
LDL Cholesterol: 42 mg/dL (ref 0–99)
NonHDL: 66.45
Total CHOL/HDL Ratio: 2
Triglycerides: 124 mg/dL (ref 0.0–149.0)
VLDL: 24.8 mg/dL (ref 0.0–40.0)

## 2022-08-23 LAB — BASIC METABOLIC PANEL
BUN: 11 mg/dL (ref 6–23)
CO2: 28 mEq/L (ref 19–32)
Calcium: 9.3 mg/dL (ref 8.4–10.5)
Chloride: 93 mEq/L — ABNORMAL LOW (ref 96–112)
Creatinine, Ser: 0.62 mg/dL (ref 0.40–1.50)
GFR: 82.17 mL/min (ref >60.00)
Glucose, Bld: 106 mg/dL — ABNORMAL HIGH (ref 70–99)
Potassium: 5.3 mEq/L — ABNORMAL HIGH (ref 3.5–5.1)
Sodium: 129 mEq/L — ABNORMAL LOW (ref 135–145)

## 2022-08-23 LAB — CBC WITH DIFFERENTIAL/PLATELET
Basophils Absolute: 0.1 10*3/uL (ref 0.0–0.1)
Basophils Relative: 1.2 % (ref 0.0–3.0)
Eosinophils Absolute: 0.1 10*3/uL (ref 0.0–0.7)
Eosinophils Relative: 1.3 % (ref 0.0–5.0)
HCT: 35 % — ABNORMAL LOW (ref 39.0–52.0)
Hemoglobin: 11.6 g/dL — ABNORMAL LOW (ref 13.0–17.0)
Lymphocytes Relative: 12.8 % (ref 12.0–46.0)
Lymphs Abs: 0.8 10*3/uL (ref 0.7–4.0)
MCHC: 33.3 g/dL (ref 30.0–36.0)
MCV: 93.9 fl (ref 78.0–100.0)
Monocytes Absolute: 0.6 10*3/uL (ref 0.1–1.0)
Monocytes Relative: 9.8 % (ref 3.0–12.0)
Neutro Abs: 4.7 10*3/uL (ref 1.4–7.7)
Neutrophils Relative %: 74.9 % (ref 43.0–77.0)
Platelets: 430 10*3/uL — ABNORMAL HIGH (ref 150.0–400.0)
RBC: 3.72 Mil/uL — ABNORMAL LOW (ref 4.22–5.81)
RDW: 14.5 % (ref 11.5–15.5)
WBC: 6.3 10*3/uL (ref 4.0–10.5)

## 2022-08-23 LAB — HEPATIC FUNCTION PANEL
ALT: 13 U/L (ref 0–53)
AST: 15 U/L (ref 0–37)
Albumin: 3.9 g/dL (ref 3.5–5.2)
Alkaline Phosphatase: 53 U/L (ref 39–117)
Bilirubin, Direct: 0.2 mg/dL (ref 0.0–0.3)
Total Bilirubin: 0.8 mg/dL (ref 0.2–1.2)
Total Protein: 7 g/dL (ref 6.0–8.3)

## 2022-08-23 LAB — TSH: TSH: 1.72 u[IU]/mL (ref 0.35–5.50)

## 2022-08-23 LAB — FERRITIN: Ferritin: 161.2 ng/mL (ref 22.0–322.0)

## 2022-08-23 LAB — HEMOGLOBIN A1C: Hgb A1c MFr Bld: 7 % — ABNORMAL HIGH (ref 4.6–6.5)

## 2022-08-23 MED ORDER — CYANOCOBALAMIN 1000 MCG/ML IJ SOLN
1000.0000 ug | Freq: Once | INTRAMUSCULAR | Status: AC
  Administered 2022-08-23: 1000 ug via INTRAMUSCULAR

## 2022-08-23 NOTE — Progress Notes (Signed)
Patient ID: [name], male   DOB: [birth year], 88 y.o.   MRN: [id number]   Subjective:    Patient ID: [name], male    DOB: 11/23/1925, 88 y.o.   MRN: [id number]   Patient here for  Chief Complaint  Patient presents with   Injections    Pt would like to discuss having the shingles and flu vaxx and also getting b12 injections    .   HPI Here to follow up regarding his diabetes, cholesterol and blood pressure.  Recent shingles.  Seeing physiatry for low back pain and pain into right buttock.  Pain has improved, but experiencing significant weakness of the right dorsiflexor - now using a wheelchair at home and walker - dragging right foot.  Also having some weakness in the left lower extremity - wearing AFO.  Recent MRI - no acute abnormality. Order has been placed for stat bilateral lower extremity EMG.  He is accompanied by his daughter.  History obtained from both of them.  Reports no feeling below knee.  Planning - PT - AFO for right foot.  No chest pain.  Breathing stable.  No increased cough or congestion.  Eating.  Discussed recent labs and blood sugars.  Reviewed outside readings.    Past Medical History:  Diagnosis Date   Actinic keratosis 02/01/2021   midline anterior chin    Allergic rhinitis    Basal cell carcinoma 01/29/2007   Right mid forehead. Excised: 03/24/2007, margins free.   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 Excised 07/27/2014, residual focus AFX, margins  free.   Squamous cell carcinoma of skin 04/01/2007   Right forehead, 2cm above mid brow. WD SCC wtih superficial infiltration. Excised: 05/14/2007, margins free   Squamous cell carcinoma of skin 05/16/2016   Crown. WD SCC, ulcerated. Excised: 07/03/2016, residual MD SCC, deep margin involved. Excised: 07/17/2016, margins free.   TIA (transient ischemic attack) 09/13/2015   Urinary outflow obstruction    mild   Past Surgical History:  Procedure Laterality Date   PARTIAL HIP ARTHROPLASTY  01/2019   PILONIDAL CYST EXCISION  1951   removal   ROTATOR CUFF REPAIR  1995   SKIN CANCER EXCISION     multiple   SQUAMOUS CELL CARCINOMA EXCISION     behind head   TONSILLECTOMY AND ADENOIDECTOMY  1938   Family History  Problem Relation Age of Onset   Heart disease Father        heart atack   Alzheimer's disease Mother    CVA Brother    Social History   Socioeconomic History   Marital status: Widowed    Spouse name: Not on file   Number of children: Not on file   Years of education: Not on file   Highest education level: Not on file  Occupational History   Not on file  Tobacco Use   Smoking status: Never   Smokeless tobacco: Never  Vaping Use  Vaping Use: Never used  Substance and Sexual Activity   Alcohol use: No    Alcohol/week: 0.0 standard drinks of alcohol   Drug use: No   Sexual activity: Not Currently    Birth control/protection: None  Other Topics Concern   Not on file  Social History Narrative   Retired, married. Walks everyday         Social Determinants of Health   Financial Resource Strain: Low Risk  (06/02/2019)   Overall Financial Resource Strain (CARDIA)    Difficulty of Paying Living Expenses: Not hard at all  Food Insecurity: No Food Insecurity (06/02/2019)   Hunger Vital Sign    Worried About Running Out of Food in the Last Year: Never true    Ran Out of Food in the Last Year: Never true  Transportation Needs: No Transportation Needs (06/02/2019)    PRAPARE - Hydrologist (Medical): No    Lack of Transportation (Non-Medical): No  Physical Activity: Insufficiently Active (06/02/2019)   Exercise Vital Sign    Days of Exercise per Week: 2 days    Minutes of Exercise per Session: 60 min  Stress: No Stress Concern Present (06/02/2019)   [place]    Feeling of Stress : Not at all  Social Connections: Unknown (06/02/2019)   Social Connection and Isolation Panel [NHANES]    Frequency of Communication with Friends and Family: Not asked    Frequency of Social Gatherings with Friends and Family: Not on file    Attends Religious Services: Not on file    Active Member of Clubs or Organizations: Not on file    Attends Archivist Meetings: Not on file    Marital Status: Not on file     Review of Systems  Constitutional:  Negative for appetite change and unexpected weight change.  HENT:  Negative for congestion and sinus pressure.   Respiratory:  Negative for cough, chest tightness and shortness of breath.   Cardiovascular:  Negative for chest pain and palpitations.       No increased swelling.   Gastrointestinal:  Negative for abdominal pain, diarrhea, nausea and vomiting.  Genitourinary:  Negative for difficulty urinating and dysuria.  Musculoskeletal:        Low back pain and pain into right buttock.  Right foot weakness.   Skin:  Negative for color change and rash.  Neurological:  Negative for dizziness and headaches.  Psychiatric/Behavioral:  Negative for agitation and dysphoric mood.        Objective:     BP 135/65 (BP Location: Left Arm, Patient Position: Sitting, Cuff Size: Normal)   Pulse 73   Temp 98 F (36.7 C) (Oral)   Ht '5\' 10"'  (1.778 m)   Wt 283 lb 6.4 oz (128.5 kg) Comment: weighed with wheel chair subtracted 15 for wheel chair  SpO2 96%   BMI 40.66 kg/m  Wt Readings from Last 3 Encounters:  08/23/22 283 lb 6.4  oz (128.5 kg)  05/22/22 222 lb (100.7 kg)  01/18/22 225 lb (102.1 kg)    Physical Exam Vitals reviewed.  Constitutional:      General: He is not in acute distress.    Appearance: Normal appearance. He is well-developed.  HENT:     Head: Normocephalic and atraumatic.     Right Ear: External ear normal.     Left Ear: External ear normal.  Eyes:     General: No scleral icterus.  Right eye: No discharge.        Left eye: No discharge.     Conjunctiva/sclera: Conjunctivae normal.  Cardiovascular:     Rate and Rhythm: Normal rate and regular rhythm.  Pulmonary:     Effort: Pulmonary effort is normal. No respiratory distress.     Breath sounds: Normal breath sounds.  Abdominal:     General: Bowel sounds are normal.     Palpations: Abdomen is soft.     Tenderness: There is no abdominal tenderness.  Musculoskeletal:        General: No tenderness.     Cervical back: Neck supple. No tenderness.     Comments: No increased tenderness.  Weakness - dorsiflexion right foot.  No decrease proximal muscle strength - stable.   Lymphadenopathy:     Cervical: No cervical adenopathy.  Skin:    Findings: No erythema or rash.  Neurological:     Mental Status: He is alert.  Psychiatric:        Mood and Affect: Mood normal.        Behavior: Behavior normal.      Outpatient Encounter Medications as of 08/23/2022  Medication Sig   Accu-Chek FastClix Lancets MISC USE 1  TO CHECK SUGARS ONCE DAILY. Dx E11.9   amLODipine (NORVASC) 10 MG tablet Take 1 tablet (10 mg total) by mouth daily.   aspirin 81 MG EC tablet Take 81 mg by mouth daily.   calcium-vitamin D (OSCAL WITH D) 500-200 MG-UNIT tablet Take 1 tablet by mouth 2 (two) times daily.   doxycycline (VIBRA-TABS) 100 MG tablet Take 1 tablet (100 mg total) by mouth 2 (two) times daily.   ferrous sulfate 325 (65 FE) MG EC tablet Take 325 mg by mouth 3 (three) times daily with meals.   finasteride (PROSCAR) 5 MG tablet TAKE 1 TABLET(5 MG) BY  MOUTH DAILY   gabapentin (NEURONTIN) 100 MG capsule Take 1 capsule (100 mg total) by mouth at bedtime.   glucose blood (ACCU-CHEK GUIDE) test strip USE 1 STRIP TO CHECK GLUCOSE ONCE DAILY DX E 11.9   lovastatin (MEVACOR) 20 MG tablet TAKE 1 TABLET BY MOUTH AT BEDTIME   metFORMIN (GLUCOPHAGE-XR) 500 MG 24 hr tablet TAKE 1 TABLET BY MOUTH EVERY DAY WITH BREAKFAST   mometasone (ELOCON) 0.1 % cream Apply to rash on abdomen once or twice a day prn   mycophenolate (CELLCEPT) 500 MG tablet Take 1,000 mg by mouth 2 (two) times daily.   olmesartan (BENICAR) 40 MG tablet Take 1 tablet by mouth once daily   tamsulosin (FLOMAX) 0.4 MG CAPS capsule TAKE 1 CAPSULE(0.4 MG) BY MOUTH DAILY AFTER SUPPER   furosemide (LASIX) 20 MG tablet Take 1 tablet (20 mg total) by mouth daily as needed. For shortness of breath and swelling   [EXPIRED] cyanocobalamin (VITAMIN B12) injection 1,000 mcg    No facility-administered encounter medications on file as of 08/23/2022.     Lab Results  Component Value Date   WBC 6.3 08/23/2022   HGB 11.6 (L) 08/23/2022   HCT 35.0 (L) 08/23/2022   PLT 430.0 (H) 08/23/2022   GLUCOSE 141 (H) 08/28/2022   CHOL 112 08/23/2022   TRIG 124.0 08/23/2022   HDL 45.50 08/23/2022   LDLCALC 42 08/23/2022   ALT 13 08/23/2022   AST 15 08/23/2022   NA 132 (L) 08/28/2022   K 3.9 08/28/2022   CL 96 (L) 08/28/2022   CREATININE 0.64 08/28/2022   BUN 11 08/28/2022   CO2 26 08/28/2022  TSH 1.72 08/23/2022   PSA 0.02 (L) 04/16/2018   HGBA1C 7.0 (H) 08/23/2022   MICROALBUR 11.4 (H) 05/18/2022    MR LUMBAR SPINE WO CONTRAST  Result Date: 07/29/2022 CLINICAL DATA:  Low back pain over the last 10 days. Pain in the right lower extremity. EXAM: MRI LUMBAR SPINE WITHOUT CONTRAST TECHNIQUE: Multiplanar, multisequence MR imaging of the lumbar spine was performed. No intravenous contrast was administered. COMPARISON:  05/01/2018 FINDINGS: Segmentation: 5 lumbar type vertebral bodies as numbered  previously. Alignment:  Mild scoliotic curvature convex to the left. Vertebrae: Old healed compression fracture at T12 with loss of height of 40%. Old healed superior endplate fracture at L1, new since the study of 2019 but completely healed. Mild loss of height at L2, unchanged since the prior study, completely healed. Mild to moderate loss of height at the L3 vertebral body, unchanged since the prior study, completely healed. L4 vertebral body remains intact. Mild loss of height of the L5 vertebral body, unchanged since the prior study, completely healed. No acute or subacute fracture seen in the region. Conus medullaris and cauda equina: Conus extends to the L2 level. Conus and cauda equina appear normal. Paraspinal and other soft tissues: Negative Disc levels: T12-L1: Mild disc bulge. Mild facet hypertrophy. No compressive stenosis. L1-2: Mild disc bulge. Moderate facet and ligamentous hypertrophy right more than left. No compressive stenosis. L2-3: Mild bulging of the disc. Mild facet and ligamentous prominence. No compressive stenosis. L3-4: Mild bulging of the disc. Bilateral facet osteoarthritis with possible fusion across the facet joints. Mild narrowing of the lateral recesses and foramina without likely neural compression. L4-5: Bulging of the disc. Facet degeneration and hypertrophy. No central canal stenosis. Mild stenosis of the lateral recesses and foramina without likely neural compression. L5-S1: Bulging of the disc. Bilateral facet degeneration worse on the left. No central canal stenosis. Foraminal narrowing on the left that could possibly affect the L5 nerve. No compressive narrowing seen on the right. Degenerative changes are only mildly progressive since the study of 2019. See above for description of fractures, none of which or acute or subacute. IMPRESSION: Old compression deformities throughout the lower thoracic and lumbar region, with sparing only of the L4 level. These all appear  unchanged since the study of 05/01/2018, with exception that the superior endplate fracture at L1 is newly seen since that time. This fracture, similarly, is completely healed however. Chronic degenerative disc disease and degenerative facet disease throughout the region. No compressive central canal stenosis. Mild narrowing of the lateral recesses and foramina at L3-4 and L4-5 but without visible neural compression. Question if there is fusion across the facet joints at L3-4. At L5-S1, there is a disc bulge and facet arthropathy, worse on the left. Left foraminal stenosis at this level could possibly affect the exiting left L5 nerve. No abnormality seen to explain right radicular pain. Electronically Signed   By: Nelson Chimes M.D.   On: 07/29/2022 14:29       Assessment & Plan:   Problem List Items Addressed This Visit     Anemia    Follow cbc.       B12 deficiency - Primary    Check b12 level.       Diabetes mellitus ([place])    Sugars reviewed on outside checks.   Low carb diet and exercise.  Follow met b and a1c.        Hypercholesterolemia    On lovastatin.  Low cholesterol diet and exercise.  Follow  lipid panel and liver function tests.        HYPERTENSION, BENIGN    On amlodipine and benicar.  Blood pressures on outside checks as outlined.  Follow pressures.  Follow metabolic panel.       Hyponatremia    Follow sodium to confirm stable.       Myasthenia gravis ([place])    Followed by neurology.  Stable.  Continue cellcept.  Follow cbc.       Other Visit Diagnoses     Lumbar radiculitis            Einar Pheasant, MD

## 2022-08-23 NOTE — Progress Notes (Signed)
Pt was administered the vitamin B12 1 mL injection. Pt tolerated shot well in his left deltoid.

## 2022-08-24 ENCOUNTER — Other Ambulatory Visit: Payer: Self-pay | Admitting: Internal Medicine

## 2022-08-24 DIAGNOSIS — E875 Hyperkalemia: Secondary | ICD-10-CM

## 2022-08-24 DIAGNOSIS — E871 Hypo-osmolality and hyponatremia: Secondary | ICD-10-CM

## 2022-08-24 NOTE — Progress Notes (Signed)
Order placed for f/u lab to be drawn at [facility]

## 2022-08-27 ENCOUNTER — Ambulatory Visit: Payer: Medicare Other | Admitting: Cardiovascular Disease

## 2022-08-27 DIAGNOSIS — M79606 Pain in leg, unspecified: Secondary | ICD-10-CM | POA: Insufficient documentation

## 2022-08-28 ENCOUNTER — Other Ambulatory Visit
Admission: RE | Admit: 2022-08-28 | Discharge: 2022-08-28 | Disposition: A | Discharge status: Home / Self Care | Payer: Medicare Other | Attending: Internal Medicine | Admitting: Internal Medicine

## 2022-08-28 DIAGNOSIS — E871 Hypo-osmolality and hyponatremia: Secondary | ICD-10-CM | POA: Insufficient documentation

## 2022-08-28 DIAGNOSIS — E875 Hyperkalemia: Secondary | ICD-10-CM | POA: Diagnosis not present

## 2022-08-28 LAB — BASIC METABOLIC PANEL
Anion gap: 10 (ref 5–15)
BUN: 11 mg/dL (ref 8–23)
CO2: 26 mmol/L (ref 22–32)
Calcium: 8.9 mg/dL (ref 8.9–10.3)
Chloride: 96 mmol/L — ABNORMAL LOW (ref 98–111)
Creatinine, Ser: 0.64 mg/dL (ref 0.61–1.24)
GFR, Estimated: >60 mL/min (ref >60)
Glucose, Bld: 141 mg/dL — ABNORMAL HIGH (ref 70–99)
Potassium: 3.9 mmol/L (ref 3.5–5.1)
Sodium: 132 mmol/L — ABNORMAL LOW (ref 135–145)

## 2022-08-29 DIAGNOSIS — M5417 Radiculopathy, lumbosacral region: Secondary | ICD-10-CM | POA: Diagnosis not present

## 2022-09-02 ENCOUNTER — Encounter: Payer: Self-pay | Admitting: Internal Medicine

## 2022-09-02 NOTE — Assessment & Plan Note (Signed)
On amlodipine and benicar.  Blood pressures on outside checks as outlined.  Follow pressures.  Follow metabolic panel.

## 2022-09-02 NOTE — Assessment & Plan Note (Signed)
Follow cbc.  

## 2022-09-02 NOTE — Assessment & Plan Note (Signed)
Sugars reviewed on outside checks.   Low carb diet and exercise.  Follow met b and a1c.

## 2022-09-02 NOTE — Assessment & Plan Note (Signed)
On lovastatin.  Low cholesterol diet and exercise.  Follow lipid panel and liver function tests.   

## 2022-09-02 NOTE — Assessment & Plan Note (Signed)
Check b12 level  

## 2022-09-02 NOTE — Assessment & Plan Note (Signed)
Followed by neurology.  Stable.  Continue cellcept.  Follow cbc.  

## 2022-09-02 NOTE — Assessment & Plan Note (Signed)
Follow sodium to confirm stable.

## 2022-09-04 ENCOUNTER — Other Ambulatory Visit: Payer: Self-pay | Admitting: Cardiovascular Disease

## 2022-09-11 NOTE — Progress Notes (Signed)
Cardiology Clinic Note   Patient Name: [name] Date of Encounter: 09/12/2022  Primary Care Provider:  Einar Pheasant, MD Primary Cardiologist:  None  Patient Profile    88 year old male with history of myasthenia gravis, hypertension, diabetes, obesity, hyperlipidemia, pernicious anemia, palpitations, hyponatremia, who is here today for follow-up.  Past Medical History    Past Medical History:  Diagnosis Date   Actinic keratosis 02/01/2021   midline anterior chin    Allergic rhinitis    Basal cell carcinoma 01/29/2007   Right mid forehead. Excised: 03/24/2007, margins free.   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 Excised 07/27/2014, residual focus AFX, margins free.   Squamous cell carcinoma of skin 04/01/2007   Right forehead, 2cm above mid brow. WD SCC wtih superficial infiltration. Excised: 05/14/2007, margins free   Squamous cell carcinoma of skin 05/16/2016   Crown. WD SCC, ulcerated. Excised: 07/03/2016, residual MD SCC, deep margin involved. Excised: 07/17/2016, margins free.   TIA (transient ischemic attack) 09/13/2015   Urinary outflow obstruction    mild   Past Surgical History:  Procedure Laterality Date   PARTIAL HIP ARTHROPLASTY  01/2019   PILONIDAL CYST EXCISION  1951   removal   ROTATOR CUFF REPAIR  1995   SKIN CANCER EXCISION     multiple   SQUAMOUS CELL CARCINOMA EXCISION     behind head   TONSILLECTOMY AND ADENOIDECTOMY  1938    Allergies  Allergies   Allergen Reactions   Magnesium Other (See Comments)    Myasthenia gravis   Penicillamine Other (See Comments)    Myasthenia gravis    History of Present Illness    [name] is a 88 year old male with history of myasthenia gravis, hypertension, diabetes, obesity, hyperlipidemia, hypertension, anemia, palpitations, and hyponatremia.  Echocardiogram completed 08/22/2020 revealed LVEF 60 to 65%, no regional wall motion abnormalities, trivial mitral valve regurgitation.  Last seen in clinic 09/01/2021 per Dr. Rockey Situ.  He was told he was doing fairly well from cardiac standpoint.  His only complaint was occasional leg swelling that he took as needed furosemide.  No other changes to medications were made or studies ordered.  Unfortunately since he was last seen he has had a bout of sciatica and then well with an episode of shingles.  Unfortunately since having shingles he has been suffering from some foot drop with his left foot.  He returns to clinic today stating that he has been doing fairly well.  Denies any chest pain, shortness of breath, palpitations, or worsening peripheral edema.  He states that he previously had shingles and had a foot drop to the right foot but has a boot and a wrap on today to help with that.  Denies any hospitalizations or visits to the emergency department.  Home Medications    Current Outpatient Medications  Medication Sig Dispense Refill   Accu-Chek FastClix Lancets MISC USE 1  TO CHECK SUGARS ONCE DAILY. Dx E11.9  102 each 3   amLODipine (NORVASC) 10 MG tablet Take 1 tablet (10 mg total) by mouth daily. 90 tablet 1   aspirin 81 MG EC tablet Take 81 mg by mouth daily.     calcium-vitamin D (OSCAL WITH D) 500-200 MG-UNIT tablet Take 1 tablet by mouth 2 (two) times daily.     doxycycline (VIBRA-TABS) 100 MG tablet Take 1 tablet (100 mg total) by mouth 2 (two) times daily. 20 tablet 0   ferrous sulfate 325 (65 FE) MG EC tablet Take 325 mg by mouth.  Takes three times weekly.     finasteride (PROSCAR) 5 MG tablet TAKE 1 TABLET(5 MG) BY MOUTH DAILY 90 tablet 3   furosemide (LASIX) 20 MG tablet TAKE 1 TABLET BY MOUTH ONCE DAILY AS NEEDED FOR SHORTNESS OF BREATH OR  SWELLING 90 tablet 0   gabapentin (NEURONTIN) 100 MG capsule Take 1 capsule (100 mg total) by mouth at bedtime. 30 capsule 1   glucose blood (ACCU-CHEK GUIDE) test strip USE 1 STRIP TO CHECK GLUCOSE ONCE DAILY DX E 11.9 50 each 5   lovastatin (MEVACOR) 20 MG tablet TAKE 1 TABLET BY MOUTH AT BEDTIME 90 tablet 0   metFORMIN (GLUCOPHAGE-XR) 500 MG 24 hr tablet TAKE 1 TABLET BY MOUTH EVERY DAY WITH BREAKFAST (Patient taking differently: 500 mg 2 (two) times daily with a meal.) 90 tablet 3   mometasone (ELOCON) 0.1 % cream Apply to rash on abdomen once or twice a day prn 45 g 1   mycophenolate (CELLCEPT) 500 MG tablet Take 1,000 mg by mouth 2 (two) times daily.     olmesartan (BENICAR) 40 MG tablet Take 1 tablet by mouth once daily 60 tablet 0   tamsulosin (FLOMAX) 0.4 MG CAPS capsule TAKE 1 CAPSULE(0.4 MG) BY MOUTH DAILY AFTER SUPPER 90 capsule 3   No current facility-administered medications for this visit.     Family History    Family History  Problem Relation Age of Onset   Heart disease Father        heart atack   Alzheimer's disease Mother    CVA Brother    He indicated that his mother is deceased. He indicated that his father is deceased. He indicated that the status of his brother is unknown.  Social History    Social History   Socioeconomic History   Marital status: Widowed    Spouse name: Not on file   Number of children: Not on file   Years of education: Not on file   Highest education level: Not on file  Occupational History   Not on file  Tobacco Use   Smoking status: Never   Smokeless tobacco: Never  Vaping Use   Vaping Use: Never used  Substance and Sexual Activity   Alcohol use: No    Alcohol/week: 0.0 standard drinks of alcohol   Drug use: No    Sexual activity: Not Currently    Birth control/protection: None  Other Topics Concern   Not on file  Social History Narrative   Retired, married. Walks everyday         Social Determinants of Health   Financial Resource Strain: Low Risk  (06/02/2019)   Overall Financial Resource Strain (CARDIA)    Difficulty of Paying Living Expenses: Not hard at all  Food Insecurity: No Food Insecurity (06/02/2019)   Hunger Vital Sign    Worried About Running Out of Food in the Last Year: Never true    Ran Out of Food in  the Last Year: Never true  Transportation Needs: No Transportation Needs (06/02/2019)   PRAPARE - Hydrologist (Medical): No    Lack of Transportation (Non-Medical): No  Physical Activity: Insufficiently Active (06/02/2019)   Exercise Vital Sign    Days of Exercise per Week: 2 days    Minutes of Exercise per Session: 60 min  Stress: No Stress Concern Present (06/02/2019)   [place]    Feeling of Stress : Not at all  Social Connections: Unknown (06/02/2019)   Social Connection and Isolation Panel [NHANES]    Frequency of Communication with Friends and Family: Not asked    Frequency of Social Gatherings with Friends and Family: Not on file    Attends Religious Services: Not on file    Active Member of Clubs or Organizations: Not on file    Attends Archivist Meetings: Not on file    Marital Status: Not on file  Intimate Partner Violence: Not on file     Review of Systems    General:  No chills, fever, night sweats or weight changes.  Endorses exertional fatigue Cardiovascular:  No chest pain, dyspnea on exertion, notices occasional peripheral edema, orthopnea, palpitations, paroxysmal nocturnal dyspnea. Dermatological: No rash, lesions/masses Respiratory: No cough, endorses occasional exertional dyspnea Urologic: No hematuria, dysuria Abdominal:   No nausea, vomiting,  diarrhea, bright red blood per rectum, melena, or hematemesis Neurologic:  No visual changes, wkns, changes in mental status. All other systems reviewed and are otherwise negative except as noted above.   Physical Exam    VS:  BP 120/60 (BP Location: Left Arm, Patient Position: Sitting, Cuff Size: Normal)   Pulse 75   Ht '5\' 11"'$  (1.803 m)   Wt 208 lb (94.3 kg)   SpO2 98%   BMI 29.01 kg/m  , BMI Body mass index is 29.01 kg/m.     GEN: Well nourished, well developed, in no acute distress. HEENT: normal. Neck: Supple, no JVD, carotid bruits, or masses. Cardiac: RRR, no murmurs, rubs, or gallops. No clubbing, cyanosis, trace pretibial edema.  Radials/DP/PT 2+ and equal bilaterally.  Has a wrap on the right ankle. Respiratory:  Respirations regular and unlabored, clear to auscultation bilaterally. GI: Soft, nontender, nondistended, BS + x 4. MS: no deformity or atrophy. Skin: warm and dry, no rash. Neuro:  Strength and sensation are intact. Psych: Normal affect.  Accessory Clinical Findings    ECG personally reviewed by me today-sinus rhythm with a rate of 75 with a left anterior fascicular block- No acute changes  Lab Results  Component Value Date   WBC 6.3 08/23/2022   HGB 11.6 (L) 08/23/2022   HCT 35.0 (L) 08/23/2022   MCV 93.9 08/23/2022   PLT 430.0 (H) 08/23/2022   Lab Results  Component Value Date   CREATININE 0.64 08/28/2022   BUN 11 08/28/2022   NA 132 (L) 08/28/2022   K 3.9 08/28/2022   CL 96 (L) 08/28/2022   CO2 26 08/28/2022   Lab Results  Component Value Date   ALT 13 08/23/2022   AST 15 08/23/2022   ALKPHOS 53 08/23/2022   BILITOT 0.8 08/23/2022   Lab Results  Component Value Date   CHOL 112 08/23/2022   HDL 45.50 08/23/2022   LDLCALC 42 08/23/2022   TRIG 124.0 08/23/2022   CHOLHDL 2 08/23/2022    Lab Results  Component Value Date   HGBA1C 7.0 (H) 08/23/2022  Assessment & Plan   1.  Essential hypertension with blood pressure today 120/60.   Remains stable.  He is continued on amlodipine 10 mg daily, furosemide 20 mg as needed, and olmesartan 40 mg daily.  2.  Hypercholesterolemia has been continued on lovastatin 20 mg daily.  LDL is currently at goal.  This is monitored by his PCP.  3.  Chronic diastolic heart failure last LVEF 60-65%.  He appears euvolemic on exam today.  Continues with furosemide as needed.  Encouraged to continue to weigh daily.  4.  Chronic hyponatremia with a sodium of 135 only as needed furosemide.  This continues to be followed by his PCP.  5.  Myasthenia gravis is continued on CellCept.  Denies any progression of disease.  6.  Type 2 diabetes with last hemoglobin A1c 810/19/23 of 7.0.  He is continued on metformin.  This continues to be monitored by his PCP.  7.  Disposition patient return to clinic to see MD/APP in 1 year or sooner if needed with EKG on return.  Midori Dado, NP 09/12/2022, 11:53 AM

## 2022-09-12 ENCOUNTER — Ambulatory Visit: Payer: Medicare Other | Attending: Cardiovascular Disease | Admitting: Cardiology

## 2022-09-12 ENCOUNTER — Encounter: Payer: Self-pay | Admitting: Cardiology

## 2022-09-12 VITALS — BP 120/60 | HR 75 | Ht 71.0 in | Wt 208.0 lb

## 2022-09-12 DIAGNOSIS — G459 Transient cerebral ischemic attack, unspecified: Secondary | ICD-10-CM | POA: Diagnosis not present

## 2022-09-12 DIAGNOSIS — E7800 Pure hypercholesterolemia, unspecified: Secondary | ICD-10-CM | POA: Diagnosis not present

## 2022-09-12 DIAGNOSIS — G7000 Myasthenia gravis without (acute) exacerbation: Secondary | ICD-10-CM | POA: Diagnosis not present

## 2022-09-12 DIAGNOSIS — I5032 Chronic diastolic (congestive) heart failure: Secondary | ICD-10-CM | POA: Insufficient documentation

## 2022-09-12 DIAGNOSIS — I10 Essential (primary) hypertension: Secondary | ICD-10-CM | POA: Diagnosis not present

## 2022-09-12 DIAGNOSIS — R0602 Shortness of breath: Secondary | ICD-10-CM | POA: Diagnosis not present

## 2022-09-12 NOTE — Patient Instructions (Signed)
Medication Instructions:   NONE  *If you need a refill on your cardiac medications before your next appointment, please call your pharmacy*   Lab Work:  NONE  If you have labs (blood work) drawn today and your tests are completely normal, you will receive your results only by: [place] (if you have MyChart) OR A paper copy in the mail If you have any lab test that is abnormal or we need to change your treatment, we will call you to review the results.   Testing/Procedures:  NONE   Follow-Up: At [facility], you and your health needs are our priority.  As part of our continuing mission to provide you with exceptional heart care, we have created designated Provider Care Teams.  These Care Teams include your primary Cardiologist (physician) and Advanced Practice Providers (APPs -  Physician Assistants and Nurse Practitioners) who all work together to provide you with the care you need, when you need it.  We recommend signing up for the patient portal called "MyChart".  Sign up information is provided on this After Visit Summary.  MyChart is used to connect with patients for Virtual Visits (Telemedicine).  Patients are able to view lab/test results, encounter notes, upcoming appointments, etc.  Non-urgent messages can be sent to your provider as well.   To learn more about what you can do with MyChart, go to [email or web address].    Your next appointment:   12 month(s)  The format for your next appointment:   In Person  Provider:   You may see or one of the following Advanced Practice Providers on your designated Care Team:   Murray Hodgkins, NP Christell Faith, PA-C Cadence Kathlen Mody, PA-C Gerrie Nordmann, NP      Important Information About Sugar

## 2022-09-13 ENCOUNTER — Telehealth: Payer: Self-pay | Admitting: Internal Medicine

## 2022-09-13 NOTE — Telephone Encounter (Signed)
Patient has a lab appt 09/19/2022, there are no orders in.

## 2022-09-13 NOTE — Telephone Encounter (Signed)
Pt advised - cancelled

## 2022-09-13 NOTE — Telephone Encounter (Signed)
Had fasting labs 08/23/22. - ok to cancel

## 2022-09-18 DIAGNOSIS — G7000 Myasthenia gravis without (acute) exacerbation: Secondary | ICD-10-CM | POA: Diagnosis not present

## 2022-09-18 DIAGNOSIS — M21371 Foot drop, right foot: Secondary | ICD-10-CM | POA: Diagnosis not present

## 2022-09-19 ENCOUNTER — Other Ambulatory Visit: Payer: Medicare Other

## 2022-09-19 DIAGNOSIS — H02054 Trichiasis without entropian left upper eyelid: Secondary | ICD-10-CM | POA: Diagnosis not present

## 2022-09-19 LAB — HM DIABETES EYE EXAM

## 2022-09-24 ENCOUNTER — Ambulatory Visit: Payer: Medicare Other | Admitting: Internal Medicine

## 2022-09-29 ENCOUNTER — Other Ambulatory Visit: Payer: Self-pay | Admitting: Urology

## 2022-09-29 ENCOUNTER — Other Ambulatory Visit: Payer: Self-pay | Admitting: Family

## 2022-09-29 DIAGNOSIS — N401 Enlarged prostate with lower urinary tract symptoms: Secondary | ICD-10-CM

## 2022-10-03 ENCOUNTER — Ambulatory Visit: Payer: Medicare Other | Admitting: Urology

## 2022-10-03 ENCOUNTER — Telehealth: Payer: Self-pay | Admitting: Internal Medicine

## 2022-10-03 ENCOUNTER — Other Ambulatory Visit: Payer: Self-pay

## 2022-10-03 ENCOUNTER — Encounter: Payer: Self-pay | Admitting: Internal Medicine

## 2022-10-03 ENCOUNTER — Ambulatory Visit (INDEPENDENT_AMBULATORY_CARE_PROVIDER_SITE_OTHER): Payer: Medicare Other | Admitting: Internal Medicine

## 2022-10-03 VITALS — BP 122/78 | HR 76 | Temp 97.8°F | Ht 70.0 in | Wt 207.6 lb

## 2022-10-03 DIAGNOSIS — E538 Deficiency of other specified B group vitamins: Secondary | ICD-10-CM | POA: Diagnosis not present

## 2022-10-03 DIAGNOSIS — I10 Essential (primary) hypertension: Secondary | ICD-10-CM

## 2022-10-03 DIAGNOSIS — E1165 Type 2 diabetes mellitus with hyperglycemia: Secondary | ICD-10-CM

## 2022-10-03 DIAGNOSIS — Z23 Encounter for immunization: Secondary | ICD-10-CM

## 2022-10-03 DIAGNOSIS — D649 Anemia, unspecified: Secondary | ICD-10-CM | POA: Diagnosis not present

## 2022-10-03 DIAGNOSIS — G7000 Myasthenia gravis without (acute) exacerbation: Secondary | ICD-10-CM

## 2022-10-03 DIAGNOSIS — M21379 Foot drop, unspecified foot: Secondary | ICD-10-CM

## 2022-10-03 DIAGNOSIS — E7800 Pure hypercholesterolemia, unspecified: Secondary | ICD-10-CM | POA: Diagnosis not present

## 2022-10-03 DIAGNOSIS — E871 Hypo-osmolality and hyponatremia: Secondary | ICD-10-CM

## 2022-10-03 MED ORDER — OLMESARTAN MEDOXOMIL 40 MG PO TABS: ORAL_TABLET | ORAL | 1 refills | Status: DC

## 2022-10-03 MED ORDER — CYANOCOBALAMIN 1000 MCG/ML IJ SOLN
1000.0000 ug | Freq: Once | INTRAMUSCULAR | Status: AC
  Administered 2022-10-03: 1000 ug via INTRAMUSCULAR

## 2022-10-03 NOTE — Progress Notes (Signed)
Patient ID: [name], male   DOB: [birth year], 88 y.o.   MRN: [id number]   Subjective:    Patient ID: [name], male    DOB: [birth year], 88 y.o.   MRN: [id number]   Patient here for  Chief Complaint  Patient presents with   Follow-up   .   HPI Here to follow up regarding diabetes, MG and recent evaluation - foot drop.  He is accompanied by family member.  History obtained from both of them. Saw neurology 09/18/22.  Recommended repeat EMG/NCS and order for PT for AFO recommendation and continued rehab. Pt prefers to see Edilia Bo [phone].  Reports he does feel some better.  Is getting around with his walker.  No chest pain reported.  No increased sob.  No abdominal pain.  Taking MOM qod to try to keep bowels moving.  Discussed miralax.     Past Medical History:  Diagnosis Date   Actinic keratosis 02/01/2021   midline anterior chin    Allergic rhinitis    Basal cell carcinoma 01/29/2007   Right mid forehead. Excised: 03/24/2007, margins free.   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 Excised 07/27/2014, residual focus AFX, margins free.   Squamous cell carcinoma of skin 04/01/2007   Right forehead, 2cm above mid brow. WD SCC wtih superficial infiltration. Excised: 05/14/2007, margins free   Squamous cell carcinoma of skin 05/16/2016   Crown. WD SCC, ulcerated. Excised: 07/03/2016, residual MD SCC, deep margin involved. Excised: 07/17/2016, margins free.   TIA (transient  ischemic attack) 09/13/2015   Urinary outflow obstruction    mild   Past Surgical History:  Procedure Laterality Date   PARTIAL HIP ARTHROPLASTY  01/2019   PILONIDAL CYST EXCISION  1951   removal   ROTATOR CUFF REPAIR  1995   SKIN CANCER EXCISION     multiple   SQUAMOUS CELL CARCINOMA EXCISION     behind head   TONSILLECTOMY AND ADENOIDECTOMY  1938   Family History  Problem Relation Age of Onset   Heart disease Father        heart atack   Alzheimer's disease Mother    CVA Brother    Social History   Socioeconomic History   Marital status: Widowed    Spouse name: Not on file   Number of children: Not on file   Years of education: Not on file   Highest education level: Not on file  Occupational History   Not on file  Tobacco Use   Smoking status: Never   Smokeless tobacco: Never  Vaping Use   Vaping Use: Never used  Substance and Sexual Activity   Alcohol use: No    Alcohol/week: 0.0 standard drinks of alcohol   Drug use: No   Sexual activity: Not Currently    Birth control/protection: None  Other Topics Concern   Not on file  Social History Narrative   Retired, married. Walks everyday  Social Determinants of Health   Financial Resource Strain: Low Risk  (06/02/2019)   Overall Financial Resource Strain (CARDIA)    Difficulty of Paying Living Expenses: Not hard at all  Food Insecurity: No Food Insecurity (06/02/2019)   Hunger Vital Sign    Worried About Running Out of Food in the Last Year: Never true    Ran Out of Food in the Last Year: Never true  Transportation Needs: No Transportation Needs (06/02/2019)   PRAPARE - Hydrologist (Medical): No    Lack of Transportation (Non-Medical): No  Physical Activity: Insufficiently Active (06/02/2019)   Exercise Vital Sign    Days of Exercise per Week: 2 days    Minutes of Exercise per Session: 60 min  Stress: No Stress Concern Present (06/02/2019)   [place]    Feeling of Stress : Not at all  Social Connections: Unknown (06/02/2019)   Social Connection and Isolation Panel [NHANES]    Frequency of Communication with Friends and Family: Not asked    Frequency of Social Gatherings with Friends and Family: Not on file    Attends Religious Services: Not on file    Active Member of Clubs or Organizations: Not on file    Attends Archivist Meetings: Not on file    Marital Status: Not on file     Review of Systems  Constitutional:  Negative for appetite change and unexpected weight change.  HENT:  Negative for congestion and sinus pressure.   Respiratory:  Negative for cough, chest tightness and shortness of breath.   Cardiovascular:  Negative for chest pain and palpitations.       No increased swelling.   Gastrointestinal:  Negative for abdominal pain, diarrhea, nausea and vomiting.  Genitourinary:  Negative for difficulty urinating and dysuria.  Musculoskeletal:  Negative for joint swelling and myalgias.       Foot drop as outlined.   Skin:  Negative for color change and rash.  Neurological:  Negative for dizziness and headaches.  Psychiatric/Behavioral:  Negative for agitation and dysphoric mood.        Objective:     BP 122/78   Pulse 76   Temp 97.8 F (36.6 C) (Oral)   Wt 207 lb 9.6 oz (94.2 kg)   SpO2 97%   BMI 28.95 kg/m  Wt Readings from Last 3 Encounters:  10/03/22 207 lb 9.6 oz (94.2 kg)  09/12/22 208 lb (94.3 kg)  08/23/22 283 lb 6.4 oz (128.5 kg)    Physical Exam Vitals reviewed.  Constitutional:      General: He is not in acute distress.    Appearance: Normal appearance. He is well-developed.  HENT:     Head: Normocephalic and atraumatic.     Right Ear: External ear normal.     Left Ear: External ear normal.  Eyes:     General: No scleral icterus.       Right eye: No discharge.        Left eye: No discharge.     Conjunctiva/sclera:  Conjunctivae normal.  Cardiovascular:     Rate and Rhythm: Normal rate and regular rhythm.  Pulmonary:     Effort: Pulmonary effort is normal. No respiratory distress.     Breath sounds: Normal breath sounds.  Abdominal:     General: Bowel sounds are normal.     Palpations: Abdomen is soft.     Tenderness: There is no abdominal  tenderness.  Musculoskeletal:        General: No tenderness.     Cervical back: Neck supple. No tenderness.     Comments: No increased swelling.   Lymphadenopathy:     Cervical: No cervical adenopathy.  Skin:    Findings: No erythema or rash.  Neurological:     Mental Status: He is alert.  Psychiatric:        Mood and Affect: Mood normal.        Behavior: Behavior normal.      Outpatient Encounter Medications as of 10/03/2022  Medication Sig   Accu-Chek FastClix Lancets MISC USE 1  TO CHECK SUGARS ONCE DAILY. Dx E11.9   amLODipine (NORVASC) 10 MG tablet Take 1 tablet (10 mg total) by mouth daily.   aspirin 81 MG EC tablet Take 81 mg by mouth daily.   calcium-vitamin D (OSCAL WITH D) 500-200 MG-UNIT tablet Take 1 tablet by mouth 2 (two) times daily.   doxycycline (VIBRA-TABS) 100 MG tablet Take 1 tablet (100 mg total) by mouth 2 (two) times daily.   ferrous sulfate 325 (65 FE) MG EC tablet Take 325 mg by mouth. Takes three times weekly.   finasteride (PROSCAR) 5 MG tablet Take 1 tablet by mouth once daily   furosemide (LASIX) 20 MG tablet TAKE 1 TABLET BY MOUTH ONCE DAILY AS NEEDED FOR SHORTNESS OF BREATH OR  SWELLING   gabapentin (NEURONTIN) 100 MG capsule Take 1 capsule (100 mg total) by mouth at bedtime.   glucose blood (ACCU-CHEK GUIDE) test strip USE 1 STRIP TO CHECK GLUCOSE ONCE DAILY DX E 11.9   lovastatin (MEVACOR) 20 MG tablet TAKE 1 TABLET BY MOUTH AT BEDTIME   metFORMIN (GLUCOPHAGE-XR) 500 MG 24 hr tablet TAKE 1 TABLET BY MOUTH EVERY DAY WITH BREAKFAST (Patient taking differently: 500 mg 2 (two) times daily with a meal.)   mometasone (ELOCON)  0.1 % cream Apply to rash on abdomen once or twice a day prn   mycophenolate (CELLCEPT) 500 MG tablet Take 1,000 mg by mouth 2 (two) times daily.   tamsulosin (FLOMAX) 0.4 MG CAPS capsule TAKE 1 CAPSULE BY MOUTH ONCE DAILY AFTER  SUPPER   [DISCONTINUED] olmesartan (BENICAR) 40 MG tablet Take 1 tablet by mouth once daily   [EXPIRED] cyanocobalamin (VITAMIN B12) injection 1,000 mcg    No facility-administered encounter medications on file as of 10/03/2022.     Lab Results  Component Value Date   WBC 6.3 08/23/2022   HGB 11.6 (L) 08/23/2022   HCT 35.0 (L) 08/23/2022   PLT 430.0 (H) 08/23/2022   GLUCOSE 141 (H) 08/28/2022   CHOL 112 08/23/2022   TRIG 124.0 08/23/2022   HDL 45.50 08/23/2022   LDLCALC 42 08/23/2022   ALT 13 08/23/2022   AST 15 08/23/2022   NA 132 (L) 08/28/2022   K 3.9 08/28/2022   CL 96 (L) 08/28/2022   CREATININE 0.64 08/28/2022   BUN 11 08/28/2022   CO2 26 08/28/2022   TSH 1.72 08/23/2022   PSA 0.02 (L) 04/16/2018   HGBA1C 7.0 (H) 08/23/2022   MICROALBUR 11.4 (H) 05/18/2022       Assessment & Plan:   Problem List Items Addressed This Visit     Anemia    Follow cbc.       Relevant Orders   CBC with Differential/Platelet   B12 deficiency - Primary   Diabetes mellitus ([place])    Low carb diet and exercise.  Follow met b and a1c.  Relevant Orders   Hemoglobin X5T   Basic metabolic panel   Foot drop    Foot drop.  He had a lumbar MRI in 07/2022 that showed more of an issue with L5 nerve root compression on the left actually and no significant compressive central canal stenosis and outside EMG was performed. Saw neurology.  Recommended repeat NCS.  Also PT referral - AFO recommendation and continued rehab.  Contact PT for referral.       Hypercholesterolemia    On lovastatin.  Low cholesterol diet and exercise.  Follow lipid panel and liver function tests.        Relevant Orders   Hepatic function panel   Lipid panel   HYPERTENSION, BENIGN     On amlodipine and benicar.  Follow pressures.  Follow metabolic panel.       Hyponatremia    Follow sodium to confirm stable.       Myasthenia gravis ([place])    Followed by neurology.  Stable.  Continue cellcept.  Follow cbc.       Other Visit Diagnoses     Need for influenza vaccination       Relevant Orders   Flu Vaccine QUAD High Dose(Fluad) (Completed)        Einar Pheasant, MD

## 2022-10-03 NOTE — Telephone Encounter (Signed)
Needs PT to evaluate and treat.  Foot drop, unsteady gait and weakness.  Pt prefers to see Edilia Bo [phone].  Order given to you and if I need to complete or sign form, let me know.

## 2022-10-04 ENCOUNTER — Other Ambulatory Visit: Payer: Self-pay

## 2022-10-04 DIAGNOSIS — R2681 Unsteadiness on feet: Secondary | ICD-10-CM

## 2022-10-04 DIAGNOSIS — R531 Weakness: Secondary | ICD-10-CM

## 2022-10-04 DIAGNOSIS — M21379 Foot drop, unspecified foot: Secondary | ICD-10-CM

## 2022-10-04 NOTE — Telephone Encounter (Signed)
Referral sent - will await form

## 2022-10-07 DIAGNOSIS — M21379 Foot drop, unspecified foot: Secondary | ICD-10-CM | POA: Insufficient documentation

## 2022-10-07 NOTE — Assessment & Plan Note (Signed)
Follow sodium to confirm stable.

## 2022-10-07 NOTE — Assessment & Plan Note (Signed)
On amlodipine and benicar.  Follow pressures.  Follow metabolic panel.  

## 2022-10-07 NOTE — Assessment & Plan Note (Signed)
Low carb diet and exercise.  Follow met b and a1c.   

## 2022-10-07 NOTE — Assessment & Plan Note (Signed)
Followed by neurology.  Stable.  Continue cellcept.  Follow cbc.  

## 2022-10-07 NOTE — Assessment & Plan Note (Signed)
On lovastatin.  Low cholesterol diet and exercise.  Follow lipid panel and liver function tests.   

## 2022-10-07 NOTE — Assessment & Plan Note (Addendum)
Foot drop.  He had a lumbar MRI in 07/2022 that showed more of an issue with L5 nerve root compression on the left actually and no significant compressive central canal stenosis and outside EMG was performed. Saw neurology.  Recommended repeat NCS.  Also PT referral - AFO recommendation and continued rehab.  Contact PT for referral.

## 2022-10-07 NOTE — Assessment & Plan Note (Signed)
Follow cbc.  

## 2022-10-07 NOTE — Addendum Note (Signed)
Addended by: Alisa Graff on: 10/07/2022 11:54 PM   Modules accepted: Level of Service

## 2022-10-10 ENCOUNTER — Ambulatory Visit (INDEPENDENT_AMBULATORY_CARE_PROVIDER_SITE_OTHER): Payer: Medicare Other | Admitting: Urology

## 2022-10-10 ENCOUNTER — Encounter: Payer: Self-pay | Admitting: Urology

## 2022-10-10 VITALS — BP 106/65 | HR 75 | Ht 70.0 in | Wt 206.0 lb

## 2022-10-10 DIAGNOSIS — N401 Enlarged prostate with lower urinary tract symptoms: Secondary | ICD-10-CM | POA: Diagnosis not present

## 2022-10-10 DIAGNOSIS — Z87440 Personal history of urinary (tract) infections: Secondary | ICD-10-CM | POA: Diagnosis not present

## 2022-10-10 LAB — BLADDER SCAN AMB NON-IMAGING

## 2022-10-10 MED ORDER — FINASTERIDE 5 MG PO TABS: ORAL_TABLET | ORAL | 3 refills | Status: DC

## 2022-10-10 MED ORDER — TAMSULOSIN HCL 0.4 MG PO CAPS: ORAL_CAPSULE | ORAL | 3 refills | Status: DC

## 2022-10-10 NOTE — Progress Notes (Signed)
   10/10/2022 10:25 AM   [name] [birth year] [id number]  Reason for visit: Follow up BPH, history of UTI  HPI: I saw [name] in urology clinic for the above issues.  He is a 88 year old male with a long history of urinary symptoms well controlled on Flomax and finasteride.  He denies any UTIs over the last 3 years, and is urinating with a good stream.  He really denies any significant urinary complaints at this time.  He takes Lasix as needed for swelling and lower extremity edema which has improved his nocturia.  We discussed the relationship between lower extremity edema and nocturia, and I encouraged him to consider compression stockings and elevation of legs in the afternoon, as well as the as needed Lasix dosing.  PVR today is essentially normal at 150 mL.  He really denies any significant changes since last year, remains satisfied with urinary symptoms.  He would like to continue Flomax and finasteride which is very reasonable.    Flomax and finasteride refilled RTC 1 year PVR   Billey Co, MD  [place] [address] [place], [place] [address] [phone]

## 2022-10-11 DIAGNOSIS — M6281 Muscle weakness (generalized): Secondary | ICD-10-CM | POA: Diagnosis not present

## 2022-10-11 DIAGNOSIS — R2689 Other abnormalities of gait and mobility: Secondary | ICD-10-CM | POA: Diagnosis not present

## 2022-10-16 DIAGNOSIS — R2689 Other abnormalities of gait and mobility: Secondary | ICD-10-CM | POA: Diagnosis not present

## 2022-10-16 DIAGNOSIS — M6281 Muscle weakness (generalized): Secondary | ICD-10-CM | POA: Diagnosis not present

## 2022-10-18 DIAGNOSIS — M6281 Muscle weakness (generalized): Secondary | ICD-10-CM | POA: Diagnosis not present

## 2022-10-18 DIAGNOSIS — R2689 Other abnormalities of gait and mobility: Secondary | ICD-10-CM | POA: Diagnosis not present

## 2022-10-19 ENCOUNTER — Other Ambulatory Visit: Payer: Self-pay | Admitting: Internal Medicine

## 2022-10-19 DIAGNOSIS — E119 Type 2 diabetes mellitus without complications: Secondary | ICD-10-CM

## 2022-10-22 ENCOUNTER — Encounter: Payer: Self-pay | Admitting: Podiatry

## 2022-10-22 ENCOUNTER — Ambulatory Visit (INDEPENDENT_AMBULATORY_CARE_PROVIDER_SITE_OTHER): Payer: Medicare Other | Admitting: Podiatry

## 2022-10-22 DIAGNOSIS — M2010 Hallux valgus (acquired), unspecified foot: Secondary | ICD-10-CM

## 2022-10-22 DIAGNOSIS — M79675 Pain in left toe(s): Secondary | ICD-10-CM

## 2022-10-22 DIAGNOSIS — E1142 Type 2 diabetes mellitus with diabetic polyneuropathy: Secondary | ICD-10-CM

## 2022-10-22 DIAGNOSIS — B351 Tinea unguium: Secondary | ICD-10-CM

## 2022-10-22 DIAGNOSIS — M79674 Pain in right toe(s): Secondary | ICD-10-CM

## 2022-10-22 NOTE — Progress Notes (Signed)
This patient returns to my office for at risk foot care.  This patient requires this care by a professional since this patient will be at risk due to having type 2 diabetes.    This patient is unable to cut nails himself since the patient cannot reach his nails.These nails are painful walking and wearing shoes.   This patient presents for at risk foot care today.  General Appearance  Alert, conversant and in no acute stress.  Vascular  Dorsalis pedis and posterior tibial  pulses are weakly  palpable  bilaterally.  Capillary return is within normal limits  bilaterally. Temperature is within normal limits  bilaterally.  Neurologic  Senn-Weinstein monofilament wire test diminished  bilaterally. Muscle power within normal limits bilaterally.  Nails Thick disfigured discolored nails with subungual debris  from hallux to fifth toes bilaterally. No evidence of bacterial infection or drainage bilaterally.  Orthopedic  No limitations of motion  feet .  No crepitus or effusions noted.  No bony pathology or digital deformities noted. Plantar flexed fifth metatarsal left foot.  Mild  HAV  Skin  normotropic skin with no porokeratosis noted bilaterally.  No signs of infections or ulcers noted.   Symptomatic callus sub 5th  Left.  Onychomycosis  Pain in right toes  Pain in left toes  Consent was obtained for treatment procedures.   Mechanical debridement of nails 1-5  bilaterally performed with a nail nipper.  Filed with dremel without incident. .    Return office visit   3 months                  Told patient to return for periodic foot care and evaluation due to potential at risk complications.   Gardiner Barefoot DPM

## 2022-10-23 DIAGNOSIS — M6281 Muscle weakness (generalized): Secondary | ICD-10-CM | POA: Diagnosis not present

## 2022-10-23 DIAGNOSIS — R2689 Other abnormalities of gait and mobility: Secondary | ICD-10-CM | POA: Diagnosis not present

## 2022-10-25 DIAGNOSIS — R2689 Other abnormalities of gait and mobility: Secondary | ICD-10-CM | POA: Diagnosis not present

## 2022-10-25 DIAGNOSIS — M6281 Muscle weakness (generalized): Secondary | ICD-10-CM | POA: Diagnosis not present

## 2022-10-27 ENCOUNTER — Encounter: Payer: Self-pay | Admitting: Internal Medicine

## 2022-10-29 ENCOUNTER — Other Ambulatory Visit: Payer: Self-pay | Admitting: Family

## 2022-10-29 MED ORDER — LOVASTATIN 20 MG PO TABS: 20.0000 mg | ORAL_TABLET | Freq: Every day | ORAL | 0 refills | Status: DC

## 2022-10-31 ENCOUNTER — Other Ambulatory Visit: Payer: Self-pay

## 2022-11-02 ENCOUNTER — Ambulatory Visit (INDEPENDENT_AMBULATORY_CARE_PROVIDER_SITE_OTHER): Payer: Medicare Other

## 2022-11-02 DIAGNOSIS — E538 Deficiency of other specified B group vitamins: Secondary | ICD-10-CM

## 2022-11-02 MED ORDER — CYANOCOBALAMIN 1000 MCG/ML IJ SOLN
1000.0000 ug | Freq: Once | INTRAMUSCULAR | Status: AC
  Administered 2022-11-02: 1000 ug via INTRAMUSCULAR

## 2022-11-02 NOTE — Progress Notes (Signed)
presents today for injection per MD orders. B12 injection administered IM in left Upper Arm. Administration without incident. Patient tolerated well.  Quinten Allerton,cma   

## 2022-11-06 ENCOUNTER — Emergency Department: Payer: Medicare Other

## 2022-11-06 ENCOUNTER — Telehealth: Payer: Self-pay | Admitting: Internal Medicine

## 2022-11-06 ENCOUNTER — Inpatient Hospital Stay
Admission: EM | Admit: 2022-11-06 | Discharge: 2022-11-13 | DRG: 641 | Disposition: A | Discharge status: Skilled Nursing Facility | Payer: Medicare Other | Attending: Student | Admitting: Student

## 2022-11-06 ENCOUNTER — Other Ambulatory Visit: Payer: Self-pay

## 2022-11-06 ENCOUNTER — Encounter: Payer: Self-pay | Admitting: *Deleted

## 2022-11-06 DIAGNOSIS — E7800 Pure hypercholesterolemia, unspecified: Secondary | ICD-10-CM | POA: Diagnosis present

## 2022-11-06 DIAGNOSIS — R41841 Cognitive communication deficit: Secondary | ICD-10-CM | POA: Diagnosis not present

## 2022-11-06 DIAGNOSIS — I959 Hypotension, unspecified: Secondary | ICD-10-CM | POA: Diagnosis not present

## 2022-11-06 DIAGNOSIS — I509 Heart failure, unspecified: Secondary | ICD-10-CM | POA: Diagnosis not present

## 2022-11-06 DIAGNOSIS — Z8673 Personal history of transient ischemic attack (TIA), and cerebral infarction without residual deficits: Secondary | ICD-10-CM | POA: Diagnosis not present

## 2022-11-06 DIAGNOSIS — K819 Cholecystitis, unspecified: Secondary | ICD-10-CM | POA: Diagnosis not present

## 2022-11-06 DIAGNOSIS — E860 Dehydration: Secondary | ICD-10-CM | POA: Diagnosis present

## 2022-11-06 DIAGNOSIS — Z85828 Personal history of other malignant neoplasm of skin: Secondary | ICD-10-CM

## 2022-11-06 DIAGNOSIS — S2243XA Multiple fractures of ribs, bilateral, initial encounter for closed fracture: Secondary | ICD-10-CM | POA: Diagnosis present

## 2022-11-06 DIAGNOSIS — I440 Atrioventricular block, first degree: Secondary | ICD-10-CM | POA: Diagnosis present

## 2022-11-06 DIAGNOSIS — S2231XD Fracture of one rib, right side, subsequent encounter for fracture with routine healing: Secondary | ICD-10-CM | POA: Diagnosis not present

## 2022-11-06 DIAGNOSIS — J811 Chronic pulmonary edema: Secondary | ICD-10-CM | POA: Diagnosis not present

## 2022-11-06 DIAGNOSIS — I5032 Chronic diastolic (congestive) heart failure: Secondary | ICD-10-CM | POA: Diagnosis present

## 2022-11-06 DIAGNOSIS — E861 Hypovolemia: Secondary | ICD-10-CM | POA: Diagnosis present

## 2022-11-06 DIAGNOSIS — R0902 Hypoxemia: Secondary | ICD-10-CM | POA: Diagnosis not present

## 2022-11-06 DIAGNOSIS — Z1152 Encounter for screening for COVID-19: Secondary | ICD-10-CM

## 2022-11-06 DIAGNOSIS — K219 Gastro-esophageal reflux disease without esophagitis: Secondary | ICD-10-CM | POA: Diagnosis present

## 2022-11-06 DIAGNOSIS — R278 Other lack of coordination: Secondary | ICD-10-CM | POA: Diagnosis not present

## 2022-11-06 DIAGNOSIS — R29818 Other symptoms and signs involving the nervous system: Secondary | ICD-10-CM | POA: Diagnosis not present

## 2022-11-06 DIAGNOSIS — K8020 Calculus of gallbladder without cholecystitis without obstruction: Secondary | ICD-10-CM

## 2022-11-06 DIAGNOSIS — I110 Hypertensive heart disease with heart failure: Secondary | ICD-10-CM | POA: Diagnosis present

## 2022-11-06 DIAGNOSIS — Z7984 Long term (current) use of oral hypoglycemic drugs: Secondary | ICD-10-CM | POA: Diagnosis not present

## 2022-11-06 DIAGNOSIS — E1165 Type 2 diabetes mellitus with hyperglycemia: Secondary | ICD-10-CM | POA: Diagnosis not present

## 2022-11-06 DIAGNOSIS — W19XXXA Unspecified fall, initial encounter: Secondary | ICD-10-CM | POA: Diagnosis not present

## 2022-11-06 DIAGNOSIS — G7000 Myasthenia gravis without (acute) exacerbation: Secondary | ICD-10-CM | POA: Diagnosis present

## 2022-11-06 DIAGNOSIS — R339 Retention of urine, unspecified: Secondary | ICD-10-CM | POA: Diagnosis present

## 2022-11-06 DIAGNOSIS — R531 Weakness: Secondary | ICD-10-CM

## 2022-11-06 DIAGNOSIS — Z743 Need for continuous supervision: Secondary | ICD-10-CM | POA: Diagnosis not present

## 2022-11-06 DIAGNOSIS — J9811 Atelectasis: Secondary | ICD-10-CM | POA: Diagnosis not present

## 2022-11-06 DIAGNOSIS — R519 Headache, unspecified: Secondary | ICD-10-CM | POA: Diagnosis not present

## 2022-11-06 DIAGNOSIS — E559 Vitamin D deficiency, unspecified: Secondary | ICD-10-CM | POA: Diagnosis present

## 2022-11-06 DIAGNOSIS — R1011 Right upper quadrant pain: Secondary | ICD-10-CM | POA: Diagnosis not present

## 2022-11-06 DIAGNOSIS — R739 Hyperglycemia, unspecified: Secondary | ICD-10-CM | POA: Diagnosis not present

## 2022-11-06 DIAGNOSIS — R109 Unspecified abdominal pain: Secondary | ICD-10-CM | POA: Diagnosis not present

## 2022-11-06 DIAGNOSIS — E871 Hypo-osmolality and hyponatremia: Principal | ICD-10-CM | POA: Diagnosis present

## 2022-11-06 DIAGNOSIS — D72829 Elevated white blood cell count, unspecified: Secondary | ICD-10-CM | POA: Diagnosis not present

## 2022-11-06 DIAGNOSIS — N400 Enlarged prostate without lower urinary tract symptoms: Secondary | ICD-10-CM | POA: Diagnosis present

## 2022-11-06 DIAGNOSIS — Z8249 Family history of ischemic heart disease and other diseases of the circulatory system: Secondary | ICD-10-CM

## 2022-11-06 DIAGNOSIS — Z823 Family history of stroke: Secondary | ICD-10-CM | POA: Diagnosis not present

## 2022-11-06 DIAGNOSIS — S2239XA Fracture of one rib, unspecified side, initial encounter for closed fracture: Secondary | ICD-10-CM

## 2022-11-06 DIAGNOSIS — E119 Type 2 diabetes mellitus without complications: Secondary | ICD-10-CM | POA: Diagnosis present

## 2022-11-06 DIAGNOSIS — R079 Chest pain, unspecified: Secondary | ICD-10-CM | POA: Diagnosis not present

## 2022-11-06 DIAGNOSIS — S2232XD Fracture of one rib, left side, subsequent encounter for fracture with routine healing: Secondary | ICD-10-CM | POA: Diagnosis not present

## 2022-11-06 DIAGNOSIS — Z820 Family history of epilepsy and other diseases of the nervous system: Secondary | ICD-10-CM | POA: Diagnosis not present

## 2022-11-06 DIAGNOSIS — S2241XA Multiple fractures of ribs, right side, initial encounter for closed fracture: Secondary | ICD-10-CM | POA: Diagnosis not present

## 2022-11-06 DIAGNOSIS — W1830XA Fall on same level, unspecified, initial encounter: Secondary | ICD-10-CM | POA: Diagnosis present

## 2022-11-06 DIAGNOSIS — M6281 Muscle weakness (generalized): Secondary | ICD-10-CM | POA: Diagnosis not present

## 2022-11-06 DIAGNOSIS — Z79899 Other long term (current) drug therapy: Secondary | ICD-10-CM

## 2022-11-06 DIAGNOSIS — Z66 Do not resuscitate: Secondary | ICD-10-CM | POA: Diagnosis present

## 2022-11-06 DIAGNOSIS — R059 Cough, unspecified: Secondary | ICD-10-CM | POA: Diagnosis not present

## 2022-11-06 DIAGNOSIS — R0602 Shortness of breath: Secondary | ICD-10-CM | POA: Diagnosis not present

## 2022-11-06 DIAGNOSIS — K8000 Calculus of gallbladder with acute cholecystitis without obstruction: Secondary | ICD-10-CM | POA: Diagnosis present

## 2022-11-06 DIAGNOSIS — I10 Essential (primary) hypertension: Secondary | ICD-10-CM | POA: Diagnosis not present

## 2022-11-06 DIAGNOSIS — R2689 Other abnormalities of gait and mobility: Secondary | ICD-10-CM | POA: Diagnosis not present

## 2022-11-06 DIAGNOSIS — Z7982 Long term (current) use of aspirin: Secondary | ICD-10-CM

## 2022-11-06 LAB — COMPREHENSIVE METABOLIC PANEL
ALT: 24 U/L (ref 0–44)
AST: 37 U/L (ref 15–41)
Albumin: 3 g/dL — ABNORMAL LOW (ref 3.5–5.0)
Alkaline Phosphatase: 57 U/L (ref 38–126)
Anion gap: 10 (ref 5–15)
BUN: 22 mg/dL (ref 8–23)
CO2: 24 mmol/L (ref 22–32)
Calcium: 8.7 mg/dL — ABNORMAL LOW (ref 8.9–10.3)
Chloride: 90 mmol/L — ABNORMAL LOW (ref 98–111)
Creatinine, Ser: 0.82 mg/dL (ref 0.61–1.24)
GFR, Estimated: >60 mL/min (ref >60)
Glucose, Bld: 199 mg/dL — ABNORMAL HIGH (ref 70–99)
Potassium: 3.7 mmol/L (ref 3.5–5.1)
Sodium: 124 mmol/L — ABNORMAL LOW (ref 135–145)
Total Bilirubin: 1.3 mg/dL — ABNORMAL HIGH (ref 0.3–1.2)
Total Protein: 7.6 g/dL (ref 6.5–8.1)

## 2022-11-06 LAB — URINALYSIS, ROUTINE W REFLEX MICROSCOPIC
Bacteria, UA: NONE SEEN
Bacteria, UA: NONE SEEN
Bilirubin Urine: NEGATIVE
Bilirubin Urine: NEGATIVE
Glucose, UA: NEGATIVE mg/dL
Glucose, UA: NEGATIVE mg/dL
Hgb urine dipstick: NEGATIVE
Ketones, ur: NEGATIVE mg/dL
Ketones, ur: NEGATIVE mg/dL
Leukocytes,Ua: NEGATIVE
Leukocytes,Ua: NEGATIVE
Nitrite: NEGATIVE
Nitrite: NEGATIVE
Protein, ur: 30 mg/dL — AB
Protein, ur: 100 mg/dL — AB
Specific Gravity, Urine: 1.016 (ref 1.005–1.030)
Specific Gravity, Urine: 1.043 — ABNORMAL HIGH (ref 1.005–1.030)
Squamous Epithelial / HPF: NONE SEEN /HPF (ref 0–5)
pH: 5 (ref 5.0–8.0)
pH: 6 (ref 5.0–8.0)

## 2022-11-06 LAB — CBC WITH DIFFERENTIAL/PLATELET
Abs Immature Granulocytes: 0.22 10*3/uL — ABNORMAL HIGH (ref 0.00–0.07)
Basophils Absolute: 0 10*3/uL (ref 0.0–0.1)
Basophils Relative: 0 %
Eosinophils Absolute: 0 10*3/uL (ref 0.0–0.5)
Eosinophils Relative: 0 %
HCT: 33.3 % — ABNORMAL LOW (ref 39.0–52.0)
Hemoglobin: 11.2 g/dL — ABNORMAL LOW (ref 13.0–17.0)
Immature Granulocytes: 1 %
Lymphocytes Relative: 5 %
Lymphs Abs: 1 10*3/uL (ref 0.7–4.0)
MCH: 30.8 pg (ref 26.0–34.0)
MCHC: 33.6 g/dL (ref 30.0–36.0)
MCV: 91.5 fL (ref 80.0–100.0)
Monocytes Absolute: 1.2 10*3/uL — ABNORMAL HIGH (ref 0.1–1.0)
Monocytes Relative: 6 %
Neutro Abs: 17.3 10*3/uL — ABNORMAL HIGH (ref 1.7–7.7)
Neutrophils Relative %: 88 %
Platelets: 402 10*3/uL — ABNORMAL HIGH (ref 150–400)
RBC: 3.64 MIL/uL — ABNORMAL LOW (ref 4.22–5.81)
RDW: 12.8 % (ref 11.5–15.5)
WBC: 19.9 10*3/uL — ABNORMAL HIGH (ref 4.0–10.5)
nRBC: 0 % (ref 0.0–0.2)

## 2022-11-06 LAB — OSMOLALITY: Osmolality: 269 mOsm/kg — ABNORMAL LOW (ref 275–295)

## 2022-11-06 LAB — TROPONIN I (HIGH SENSITIVITY)
Troponin I (High Sensitivity): 16 ng/L (ref <18)
Troponin I (High Sensitivity): 11 ng/L (ref <18)

## 2022-11-06 LAB — RESP PANEL BY RT-PCR (RSV, FLU A&B, COVID)  RVPGX2
Influenza A by PCR: NEGATIVE
Influenza B by PCR: NEGATIVE
Resp Syncytial Virus by PCR: NEGATIVE
SARS Coronavirus 2 by RT PCR: NEGATIVE

## 2022-11-06 LAB — CK: Total CK: 278 U/L (ref 49–397)

## 2022-11-06 LAB — OSMOLALITY, URINE: Osmolality, Ur: 456 mOsm/kg (ref 300–900)

## 2022-11-06 LAB — SODIUM, URINE, RANDOM: Sodium, Ur: <10 mmol/L

## 2022-11-06 LAB — LACTIC ACID, PLASMA: Lactic Acid, Venous: 1.9 mmol/L (ref 0.5–1.9)

## 2022-11-06 LAB — BRAIN NATRIURETIC PEPTIDE: B Natriuretic Peptide: 155.2 pg/mL — ABNORMAL HIGH (ref 0.0–100.0)

## 2022-11-06 MED ORDER — AMLODIPINE BESYLATE 10 MG PO TABS
10.0000 mg | ORAL_TABLET | Freq: Every day | ORAL | Status: DC
  Administered 2022-11-07 – 2022-11-12 (×7): 10 mg via ORAL
  Filled 2022-11-06 (×3): qty 1
  Filled 2022-11-06 (×2): qty 2
  Filled 2022-11-06 (×2): qty 1

## 2022-11-06 MED ORDER — IOHEXOL 350 MG/ML SOLN
75.0000 mL | Freq: Once | INTRAVENOUS | Status: AC | PRN
  Administered 2022-11-06: 75 mL via INTRAVENOUS

## 2022-11-06 MED ORDER — ONDANSETRON HCL 4 MG PO TABS: 4.0000 mg | ORAL_TABLET | Freq: Four times a day (QID) | ORAL | Status: DC | PRN

## 2022-11-06 MED ORDER — MYCOPHENOLATE MOFETIL 250 MG PO CAPS
1000.0000 mg | ORAL_CAPSULE | Freq: Two times a day (BID) | ORAL | Status: DC
  Administered 2022-11-07 – 2022-11-13 (×13): 1000 mg via ORAL
  Filled 2022-11-06 (×15): qty 4

## 2022-11-06 MED ORDER — PRAVASTATIN SODIUM 20 MG PO TABS
20.0000 mg | ORAL_TABLET | Freq: Every day | ORAL | Status: DC
  Administered 2022-11-07 – 2022-11-12 (×6): 20 mg via ORAL
  Filled 2022-11-06 (×6): qty 1

## 2022-11-06 MED ORDER — POLYETHYLENE GLYCOL 3350 17 G PO PACK
17.0000 g | PACK | Freq: Every day | ORAL | Status: DC | PRN
  Administered 2022-11-07: 17 g via ORAL
  Filled 2022-11-06: qty 1

## 2022-11-06 MED ORDER — FINASTERIDE 5 MG PO TABS
5.0000 mg | ORAL_TABLET | Freq: Every day | ORAL | Status: DC
  Administered 2022-11-07 – 2022-11-12 (×7): 5 mg via ORAL
  Filled 2022-11-06 (×8): qty 1

## 2022-11-06 MED ORDER — TAMSULOSIN HCL 0.4 MG PO CAPS
0.4000 mg | ORAL_CAPSULE | Freq: Every day | ORAL | Status: DC
  Administered 2022-11-07 – 2022-11-13 (×7): 0.4 mg via ORAL
  Filled 2022-11-06 (×7): qty 1

## 2022-11-06 MED ORDER — ACETAMINOPHEN 325 MG PO TABS
650.0000 mg | ORAL_TABLET | Freq: Four times a day (QID) | ORAL | Status: DC | PRN
  Administered 2022-11-12 (×2): 650 mg via ORAL
  Filled 2022-11-06 (×2): qty 2

## 2022-11-06 MED ORDER — ACETAMINOPHEN 650 MG RE SUPP: 650.0000 mg | Freq: Four times a day (QID) | RECTAL | Status: DC | PRN

## 2022-11-06 MED ORDER — INSULIN ASPART 100 UNIT/ML IJ SOLN
0.0000 [IU] | Freq: Three times a day (TID) | INTRAMUSCULAR | Status: DC
  Administered 2022-11-07 – 2022-11-08 (×2): 2 [IU] via SUBCUTANEOUS
  Administered 2022-11-08: 3 [IU] via SUBCUTANEOUS
  Administered 2022-11-09 – 2022-11-10 (×3): 2 [IU] via SUBCUTANEOUS
  Administered 2022-11-10: 3 [IU] via SUBCUTANEOUS
  Administered 2022-11-10: 2 [IU] via SUBCUTANEOUS
  Administered 2022-11-11: 3 [IU] via SUBCUTANEOUS
  Administered 2022-11-11 (×2): 2 [IU] via SUBCUTANEOUS
  Administered 2022-11-12 (×2): 3 [IU] via SUBCUTANEOUS
  Administered 2022-11-12 – 2022-11-13 (×3): 2 [IU] via SUBCUTANEOUS
  Filled 2022-11-06 (×16): qty 1

## 2022-11-06 MED ORDER — PIPERACILLIN-TAZOBACTAM 3.375 G IVPB
3.3750 g | Freq: Once | INTRAVENOUS | Status: AC
  Administered 2022-11-06: 3.375 g via INTRAVENOUS
  Filled 2022-11-06: qty 50

## 2022-11-06 MED ORDER — GABAPENTIN 100 MG PO CAPS
100.0000 mg | ORAL_CAPSULE | Freq: Every day | ORAL | Status: DC
  Administered 2022-11-07 – 2022-11-12 (×7): 100 mg via ORAL
  Filled 2022-11-06 (×7): qty 1

## 2022-11-06 MED ORDER — ASPIRIN 81 MG PO TBEC
81.0000 mg | DELAYED_RELEASE_TABLET | Freq: Every day | ORAL | Status: DC
  Administered 2022-11-07: 81 mg via ORAL
  Filled 2022-11-06: qty 1

## 2022-11-06 MED ORDER — HYDROCODONE-ACETAMINOPHEN 5-325 MG PO TABS
1.0000 | ORAL_TABLET | Freq: Four times a day (QID) | ORAL | Status: DC | PRN
  Administered 2022-11-12: 1 via ORAL
  Filled 2022-11-06: qty 1

## 2022-11-06 MED ORDER — IRBESARTAN 150 MG PO TABS
300.0000 mg | ORAL_TABLET | Freq: Every day | ORAL | Status: DC
  Administered 2022-11-07 – 2022-11-13 (×7): 300 mg via ORAL
  Filled 2022-11-06 (×7): qty 2

## 2022-11-06 MED ORDER — ENOXAPARIN SODIUM 40 MG/0.4ML IJ SOSY: 40.0000 mg | PREFILLED_SYRINGE | INTRAMUSCULAR | Status: DC

## 2022-11-06 MED ORDER — SODIUM CHLORIDE 0.9 % IV SOLN: INTRAVENOUS | Status: AC

## 2022-11-06 MED ORDER — ONDANSETRON HCL 4 MG/2ML IJ SOLN: 4.0000 mg | Freq: Four times a day (QID) | INTRAMUSCULAR | Status: DC | PRN

## 2022-11-06 NOTE — Assessment & Plan Note (Signed)
Patient has a history of chronic hyponatremia with acute on chronic worsening today with sodium of 124.  He appears dehydrated on examination in the setting of poor p.o. intake.  Mildly low serum osmolality noted.  No indication at this time for hypertonic saline.  - Gentle IV normal saline overnight - Sodium checks every 8 hours - Urine studies still pending

## 2022-11-06 NOTE — Assessment & Plan Note (Signed)
CT imaging notable for acute nondisplaced fractures of bilateral anterior seventh ribs.  Likely from recent fall although patient believes it may be from a viral illness that he had several weeks ago.  - Pain control with Norco - Incentive spirometry

## 2022-11-06 NOTE — Assessment & Plan Note (Addendum)
Patient is presenting with several day weakness after a ground-level fall that he states his legs "crumbled".  On examination, his right lower extremity is weaker than the left, but this is chronic.  Etiology is uncertain but likely multifactorial in the setting of muscle weakness after prolonged downtime with the fall, dehydration, and hyponatremia.  No exam findings to suggest a myasthenia gravis flare or CVA  - IV fluids as noted below - PT/OT

## 2022-11-06 NOTE — Consult Note (Signed)
PHARMACY -  BRIEF ANTIBIOTIC NOTE   Pharmacy has received consult(s) for Zosyn from an ED provider.  The patient's profile has been reviewed for ht/wt/allergies/indication/available labs.    One time order(s) placed for piperacillin-tazobactam 3.375 g IV  Further antibiotics/pharmacy consults should be ordered by admitting physician if indicated.                       Thank you, Will M. Ouida Sills, PharmD PGY-1 Pharmacy Resident 11/06/2022 6:47 PM

## 2022-11-06 NOTE — ED Provider Notes (Signed)
[facility] Provider Note    Event Date/Time   First MD Initiated Contact with Patient 11/06/22 1726     (approximate)   History   Fall and Weakness   HPI  [name] is a 89 y.o. male  here with fall, weakness. Pt has had worsening weakness over the past week. He fell several days ago and was down for 5-6 hours. He did not have his LifeAlert on. He says his legs just gave out and he slid onto the ground and was too weak to get back up. Since then, he has had worsening generalized weakness, now no longer able to support himself much. He has had some aching, positional chest pain, worse with inspiration. No alleviating factors. He has had poor appetite. He couldn't support himself at all today so family brings him in for evaluation. He did have a mild cough prior to the first fall, and had "choked" on some Cheerios prior to this.    Physical Exam   Triage Vital Signs: ED Triage Vitals  Enc Vitals Group     BP 11/06/22 1539 135/67     Pulse Rate 11/06/22 1539 78     Resp 11/06/22 1539 18     Temp 11/06/22 1539 97.7 F (36.5 C)     Temp Source 11/06/22 1539 Oral     SpO2 11/06/22 1539 96 %     Weight 11/06/22 1535 205 lb 0.4 oz (93 kg)     Height 11/06/22 1535 '5\' 10"'$  (1.778 m)     Head Circumference --      Peak Flow --      Pain Score 11/06/22 1535 0     Pain Loc --      Pain Edu? --      Excl. in [place]? --     Most recent vital signs: Vitals:   11/06/22 1539  BP: 135/67  Pulse: 78  Resp: 18  Temp: 97.7 F (36.5 C)  SpO2: 96%     General: Awake, no distress.  CV:  Good peripheral perfusion. RRR. No murmurs. Resp:  Normal effort. Lungs with bilateral rales, worse on right, course breath sounds. Abd:  No distention. No tenderness. Other:  CNII-XII intact. Strength 5/5 bilateral UE and LE. Normal sensation to light touch.    ED Results / Procedures / Treatments   Labs (all labs ordered are listed, but only abnormal results are  displayed) Labs Reviewed  CBC WITH DIFFERENTIAL/PLATELET - Abnormal; Notable for the following components:      Result Value   WBC 19.9 (*)    RBC 3.64 (*)    Hemoglobin 11.2 (*)    HCT 33.3 (*)    Platelets 402 (*)    Neutro Abs 17.3 (*)    Monocytes Absolute 1.2 (*)    Abs Immature Granulocytes 0.22 (*)    All other components within normal limits  COMPREHENSIVE METABOLIC PANEL - Abnormal; Notable for the following components:   Sodium 124 (*)    Chloride 90 (*)    Glucose, Bld 199 (*)    Calcium 8.7 (*)    Albumin 3.0 (*)    Total Bilirubin 1.3 (*)    All other components within normal limits  BRAIN NATRIURETIC PEPTIDE - Abnormal; Notable for the following components:   B Natriuretic Peptide 155.2 (*)    All other components within normal limits  URINALYSIS, ROUTINE W REFLEX MICROSCOPIC - Abnormal; Notable for the following components:   Color,  Urine YELLOW (*)    APPearance CLEAR (*)    Protein, ur 30 (*)    All other components within normal limits  OSMOLALITY - Abnormal; Notable for the following components:   Osmolality 269 (*)    All other components within normal limits  RESP PANEL BY RT-PCR (RSV, FLU A&B, COVID)  RVPGX2  CULTURE, BLOOD (ROUTINE X 2)  CULTURE, BLOOD (ROUTINE X 2)  CK  LACTIC ACID, PLASMA  PROCALCITONIN  URINALYSIS, ROUTINE W REFLEX MICROSCOPIC  SODIUM, URINE, RANDOM  OSMOLALITY, URINE  LACTIC ACID, PLASMA  LIPASE, BLOOD  TROPONIN I (HIGH SENSITIVITY)  TROPONIN I (HIGH SENSITIVITY)     EKG Normal sinus rhythm, VR 83. PR 208, QRS 106, QTc 439. NO acute ST elevations or depressions. PVCs. No ischemia.   RADIOLOGY CT Head: NAICA CXR: Bibasilar infiltrates CT Chest Angio: Bilateral effusions and opacities, ? Cholecystitis RUQ [place]: GB stone in neck, 4.1 wall thickening but no fluid, neg Murphy's   I also independently reviewed and agree with radiologist interpretations.   PROCEDURES:  Critical Care performed: No  MEDICATIONS ORDERED  IN ED: Medications  0.9 %  sodium chloride infusion ( Intravenous New Bag/Given 11/06/22 1954)  piperacillin-tazobactam (ZOSYN) IVPB 3.375 g (3.375 g Intravenous New Bag/Given 11/06/22 2006)  iohexol (OMNIPAQUE) 350 MG/ML injection 75 mL (75 mLs Intravenous Contrast Given 11/06/22 1813)     IMPRESSION / MDM / ASSESSMENT AND PLAN / ED COURSE  I reviewed the triage vital signs and the nursing notes.                              Differential diagnosis includes, but is not limited to, generalized weakness from occult infection such as PNA, COVID-19, influenza, UTI, deconditioning, dehydration, polypharmacy/med effect, CVA.  Patient's presentation is most consistent with acute presentation with potential threat to life or bodily function.   89 yo M with PMHx SCCa, HTN, BPH, GERD, HTN, HLD, here with generalized weakness. Labs remarkable for severe acute on chronic hyponatremia, Na 124. Pt mildly hypovolemic clinically. Pt also has had a cough and has WBC 19.9k with bibasilar infiltrates on CXR and reported coughing during eating prior to sx - query possible aspiration PNA. Will cover with Zosyn as this will cover intra-abd source as well per below.Labs o/w reassuring. HypoNA labs sent. EKG Nonischemic.  Re: US findings: pt has no RUQ TTP and LFTs, alkP are normal. Bili minimally elevated. Low suspicion for chole at this time but will obtain HIDA. D/w Dr. Lysle Pearl who is in agreement and will see in AM.  FINAL CLINICAL IMPRESSION(S) / ED DIAGNOSES   Final diagnoses:  Hyponatremia  Leukocytosis, unspecified type  Gallstones     Rx / DC Orders   ED Discharge Orders     None        Note:  This document was prepared using Dragon voice recognition software and may include unintentional dictation errors.   Duffy Bruce, MD 11/06/22 2025

## 2022-11-06 NOTE — Assessment & Plan Note (Signed)
Patient states he has been in remission for years and does not believe he has ever had a flare.  -Continue home CellCept

## 2022-11-06 NOTE — Telephone Encounter (Signed)
Left voicemail to return call. Will hold the 9am slot tomorrow 11/07/22 just in case. Will send mychart as well. I have asked the daughter to call us back to let me know as soon as possible if he can make that appt or reply to [employer].

## 2022-11-06 NOTE — ED Triage Notes (Addendum)
Pt brought in by family.  Pt fell 4 days ago and was on the floor for several hours.  Pt states his legs just gave out. Pt lives alone.    Pt brought in via ems today.  Iv in place   pt alert  speech clear.   Family also reports dark urine since the fall.

## 2022-11-06 NOTE — Telephone Encounter (Signed)
Pt daughter n law called stating patient fell over the holiday and layed there for five to six hours before anyone noticed he was there because he did not have on his medical alert bracelet. Pt does not have any broken bones and pt is weak getting out of a chair. Pt daughter n law would like to be called

## 2022-11-06 NOTE — Consult Note (Signed)
Subjective:   CC: Cholelithiasis  HPI:  [name] is a 89 y.o. male who is consulted by Ellender Hose for evaluation of above cc.    Presented to the ED for worsening weakness and workup noted possible pneumonia as well as cholelithiasis possible concerns for cholecystitis.  Patient does not endorse any specific right upper quadrant pain but family does report occasional nausea for the past few weeks.    Past Medical History:  has a past medical history of Actinic keratosis (02/01/2021), Allergic rhinitis, Basal cell carcinoma (01/29/2007), Basal cell carcinoma (11/15/2021), BPH (benign prostatic hypertrophy), Degenerative arthritis, Diabetes mellitus ([place]), GERD (gastroesophageal reflux disease), HTN (hypertension), basal cell carcinoma (06/29/2020), Hypercholesterolemia, HYPERTENSION, BENIGN (08/15/2010), Iron deficiency anemia, Skin cancer (06/23/2014), Squamous cell carcinoma of skin (04/01/2007), Squamous cell carcinoma of skin (05/16/2016), TIA (transient ischemic attack) (09/13/2015), and Urinary outflow obstruction.  Past Surgical History:  has a past surgical history that includes Pilonidal cyst excision (0867); Rotator cuff repair (1995); Tonsillectomy and adenoidectomy (1938); Skin cancer excision; Squamous cell carcinoma excision; and Partial hip arthroplasty (01/2019).  Family History: family history includes Alzheimer's disease in his mother; CVA in his brother; Heart disease in his father.  Social History:  reports that he has never smoked. He has never used smokeless tobacco. He reports that he does not drink alcohol and does not use drugs.  Current Medications:  Prior to Admission medications   Medication Sig Start Date End Date Taking? Authorizing Provider  Accu-Chek FastClix Lancets MISC USE 1  TO CHECK SUGARS ONCE DAILY. Dx E11.9 01/17/22   Einar Pheasant, MD  amLODipine (NORVASC) 10 MG tablet Take 1 tablet (10 mg total) by mouth daily. 07/27/22   Einar Pheasant, MD   aspirin 81 MG EC tablet Take 81 mg by mouth daily.    [provider]  calcium-vitamin D (OSCAL WITH D) 500-200 MG-UNIT tablet Take 1 tablet by mouth 2 (two) times daily.    [provider]  ferrous sulfate 325 (65 FE) MG EC tablet Take 325 mg by mouth. Takes three times weekly.    [provider]  finasteride (PROSCAR) 5 MG tablet Take 1 tablet by mouth once daily 10/10/22   Billey Co, MD  furosemide (LASIX) 20 MG tablet TAKE 1 TABLET BY MOUTH ONCE DAILY AS NEEDED FOR SHORTNESS OF BREATH OR  SWELLING 09/04/22   Minna Merritts, MD  gabapentin (NEURONTIN) 100 MG capsule Take 1 capsule (100 mg total) by mouth at bedtime. 08/01/22   Einar Pheasant, MD  glucose blood (ACCU-CHEK GUIDE) test strip USE 1 STRIP TO CHECK GLUCOSE ONCE DAILY DX E 11.9 05/10/22   Einar Pheasant, MD  lovastatin (MEVACOR) 20 MG tablet Take 1 tablet (20 mg total) by mouth at bedtime. 10/29/22   Kennyth Arnold, FNP  metFORMIN (GLUCOPHAGE-XR) 500 MG 24 hr tablet Take 1 tablet by mouth once daily with breakfast 10/19/22   Einar Pheasant, MD  mometasone (ELOCON) 0.1 % cream Apply to rash on abdomen once or twice a day prn 04/06/21   Ralene Bathe, MD  mycophenolate (CELLCEPT) 500 MG tablet Take 1,000 mg by mouth 2 (two) times daily. 05/15/21   [provider]  olmesartan (BENICAR) 40 MG tablet Take 1 tablet by mouth once daily 10/03/22   Einar Pheasant, MD  tamsulosin (FLOMAX) 0.4 MG CAPS capsule TAKE 1 CAPSULE BY MOUTH ONCE DAILY AFTER  SUPPER 10/10/22   Billey Co, MD    Allergies:  Allergies as of 11/06/2022 - Review  Complete 11/06/2022  Allergen Reaction Noted   Magnesium Other (See Comments) 02/07/2019   Penicillamine Other (See Comments) 02/07/2019    ROS:  General: Denies weight loss, weight gain, fatigue, fevers, chills, and night sweats. Eyes: Denies blurry vision, double vision, eye pain, itchy eyes, and tearing. Ears: Denies hearing loss, earache, and ringing in  ears. Nose: Denies sinus pain, congestion, infections, runny nose, and nosebleeds. Mouth/throat: Denies hoarseness, sore throat, bleeding gums, and difficulty swallowing. Heart: Denies chest pain, palpitations, racing heart, irregular heartbeat, leg pain or swelling, and decreased activity tolerance. Respiratory: Denies breathing difficulty, shortness of breath, wheezing, cough, and sputum. GI: Denies change in appetite, heartburn,  vomiting, constipation, diarrhea, and blood in stool. GU: Denies difficulty urinating, pain with urinating, urgency, frequency, blood in urine. Musculoskeletal: Denies joint stiffness, pain, swelling, muscle weakness. Skin: Denies rash, itching, mass, tumors, sores, and boils Neurologic: Denies headache, fainting, dizziness, seizures, numbness, and tingling. Psychiatric: Denies depression, anxiety, difficulty sleeping, and memory loss. Endocrine: Denies heat or cold intolerance, and increased thirst or urination. Blood/lymph: Denies easy bruising, and swollen glands     Objective:     BP 135/67 (BP Location: Left Arm)   Pulse 78   Temp 97.7 F (36.5 C) (Oral)   Resp 18   Ht '5\' 10"'$  (1.778 m)   Wt 93 kg   SpO2 96%   BMI 29.42 kg/m    Constitutional :  alert, cooperative, appears stated age, and no distress  Lymphatics/Throat:  no asymmetry, masses, or scars  Respiratory:  clear to auscultation bilaterally  Cardiovascular:  regular rate and rhythm  Gastrointestinal: soft, non-tender; bowel sounds normal; no masses,  no organomegaly.   Musculoskeletal: Steady movement  Skin: Cool and moist  Psychiatric: Normal affect, non-agitated, not confused       LABS:     Latest Ref Rng & Units 11/06/2022    2:23 PM 08/28/2022   10:44 AM 08/23/2022   12:06 PM  CMP  Glucose 70 - 99 mg/dL 199  141  106   BUN 8 - 23 mg/dL '22  11  11   '$ Creatinine 0.61 - 1.24 mg/dL 0.82  0.64  0.62   Sodium 135 - 145 mmol/L 124  132  129   Potassium 3.5 - 5.1 mmol/L 3.7  3.9   5.3 No hemolysis seen   Chloride 98 - 111 mmol/L 90  96  93   CO2 22 - 32 mmol/L '24  26  28   '$ Calcium 8.9 - 10.3 mg/dL 8.7  8.9  9.3   Total Protein 6.5 - 8.1 g/dL 7.6   7.0   Total Bilirubin 0.3 - 1.2 mg/dL 1.3   0.8   Alkaline Phos 38 - 126 U/L 57   53   AST 15 - 41 U/L 37   15   ALT 0 - 44 U/L 24   13       Latest Ref Rng & Units 11/06/2022    2:23 PM 08/23/2022   12:06 PM 05/18/2022    7:53 AM  CBC  WBC 4.0 - 10.5 K/uL 19.9  6.3  6.8   Hemoglobin 13.0 - 17.0 g/dL 11.2  11.6  12.0   Hematocrit 39.0 - 52.0 % 33.3  35.0  36.0   Platelets 150 - 400 K/uL 402  430.0  390.0      RADS: Narrative & Impression  CLINICAL DATA:  Right upper quadrant pain   EXAM: ULTRASOUND ABDOMEN LIMITED RIGHT UPPER QUADRANT   COMPARISON:  None Available.   FINDINGS: Gallbladder:   There is an immobile stone within the gallbladder neck throughout the study. The gallbladder wall measures 4.1 mm. No Murphy's sign or pericholecystic fluid.   Common bile duct:   Diameter: 2.7 mm   Liver:   No focal lesion identified. Within normal limits in parenchymal echogenicity. Portal vein is patent on color Doppler imaging with normal direction of blood flow towards the liver.   Other: None.   IMPRESSION: 1. There is a stone in the neck of the gallbladder throughout the study with mild thickening of the gallbladder wall measuring 4.1 mm. No Murphy's sign or pericholecystic fluid. If the clinical picture is ambiguous, a HIDA scan could further evaluate for acute cholecystitis.     Electronically Signed   By: Dorise Bullion III M.D.   On: 11/06/2022 19:40  CLINICAL DATA:  Fall 5 days ago.  Dry cough and mild chest pain.   EXAM: CT ANGIOGRAPHY CHEST WITH CONTRAST   TECHNIQUE: Multidetector CT imaging of the chest was performed using the standard protocol during bolus administration of intravenous contrast. Multiplanar CT image reconstructions and MIPs were obtained to evaluate the vascular  anatomy.   RADIATION DOSE REDUCTION: This exam was performed according to the departmental dose-optimization program which includes automated exposure control, adjustment of the mA and/or kV according to patient size and/or use of iterative reconstruction technique.   CONTRAST:  45m OMNIPAQUE IOHEXOL 350 MG/ML SOLN   COMPARISON:  Same-day chest radiograph, lumbar spine MRI 07/25/2022   FINDINGS: Cardiovascular: There is adequate opacification of the pulmonary arteries to the segmental level. There is no evidence of pulmonary embolism. The heart size is normal. There is no significant pericardial effusion. Coronary artery calcifications and mild calcified plaque in the thoracic aorta are noted.   Mediastinum/Nodes: The imaged thyroid is unremarkable. The esophagus is grossly unremarkable. There is a small hiatal hernia. There is no mediastinal, hilar, or axillary lymphadenopathy.   Lungs/Pleura: The trachea and central airways are patent.   Lung volumes are low. There is no overt pulmonary edema. There are small right larger than left pleural effusions with adjacent opacity likely reflecting atelectasis. The upper lobes and right middle lobe are well aerated. There is no suspicious nodule. There is no pneumothorax.   Upper Abdomen: There is are acute nondisplaced fractures of the bilateral anterior seventh ribs. There is compression deformity of the T12 vertebral body and partially imaged compression deformity of the L1 vertebral body, unchanged since the prior lumbar spine MRI. There is multilevel degenerative change of the thoracic spine.   Musculoskeletal: There is a 2.4 cm gallstone in the gallbladder neck with distention of the gallbladder, wall thickening, and surrounding fat stranding.   Review of the MIP images confirms the above findings.   IMPRESSION: 1. No evidence of pulmonary embolism. 2. Acute nondisplaced fractures of the bilateral anterior seventh ribs. No  pneumothorax. 3. Small right larger than left pleural effusions with adjacent opacity likely reflecting atelectasis. 4. Findings consistent with acute cholecystitis with a 2.4 cm gallstone in the gallbladder neck.     Electronically Signed   By: PValetta MoleM.D.   On: 11/06/2022 18:36 Assessment:      Cholelithiasis possible cholecystitis based on CT scan by equivocal on right upper quadrant ultrasound.  Plan:     Agree with proceeding with HIDA to see if there is more definitive signs of acute cholecystitis since patient is minimally symptomatic at this point.  He is also high  risk for proceeding with any surgery at this point due to the likely pneumonia as well as his very advanced age.  Pending HIDA scan results will discuss with family and patient next steps regarding his gallbladder status.    Diet per HIDA scan protocol in the meantime.  Surgery will continue to follow.  Labs/images/medications/previous chart entries reviewed personally and relevant changes/updates noted above.

## 2022-11-06 NOTE — Assessment & Plan Note (Signed)
-   SSI, moderate - Hold home metformin

## 2022-11-06 NOTE — ED Provider Triage Note (Signed)
  Emergency Medicine Provider Triage Evaluation Note  [name] , a 89 y.o.male,  was evaluated in triage.  Pt complains of weakness.  He states that his refill on for a week over the past few days.  Approximately 5 days ago, he sustained a mechanical fall and was on the floor for several hours before being found.  Since then, he has had dark urine and pain in his lower extremities.  In addition, he has additionally experiencing a dry cough and has some mild chest pain as well.  Lastly, he states that he feels some numbness/tingling this in his right upper extremity.   Review of Systems  Positive: Weakness, lower extremity pain, dark urine Negative: Denies fever, chest pain, vomiting  Physical Exam  There were no vitals filed for this visit. Gen:   Awake, no distress   Resp:  Normal effort  MSK:   Moves extremities without difficulty  Other:    Medical Decision Making  Given the patient's initial medical screening exam, the following diagnostic evaluation has been ordered. The patient will be placed in the appropriate treatment space, once one is available, to complete the evaluation and treatment. I have discussed the plan of care with the patient and I have advised the patient that an ED physician or mid-level practitioner will reevaluate their condition after the test results have been received, as the results may give them additional insight into the type of treatment they may need.    Diagnostics: Labs, UA, CXR, head CT, respiratory panel.  Treatments: none immediately   Teodoro Spray, [place] 11/06/22 1427

## 2022-11-06 NOTE — Assessment & Plan Note (Addendum)
Presenting with elevated WBC at 19.  Initially there were concern for aspiration pneumonia given possible recent choking event and bilateral pleural effusion seen on imaging.  No prior imaging to see if these tiny effusions are chronic.  Patient and his family at bedside adamantly deny any shortness of breath or coughing the last week.  Etiology differential includes aspiration pneumonitis versus asymptomatic cholecystitis given large gallstone in the gallbladder neck versus atelectasis from rib fracture.  - Discontinue antimicrobials at this time - Low threshold to restart if patient should develop a fever, or becomes symptomatic - Repeat CBC in the a.m.

## 2022-11-06 NOTE — Assessment & Plan Note (Addendum)
On CT imaging, there was noted to be a 2.4 cm gallstone in the gallbladder neck with surrounding edema and gallbladder wall thickening.  Patient is generally asymptomatic with only very minimal tenderness to deep palpation.  LFTs within normal limits  - General surgery consulted; appreciate their recommendations - HIDA scan ordered - Hold off on further antimicrobials at this time

## 2022-11-06 NOTE — H&P (Signed)
History and Physical    Patient: [name] WCB:[id number] DOB: December 18, 1925 DOA: 11/06/2022 DOS: the patient was seen and examined on 11/06/2022 PCP: Einar Pheasant, MD  Patient coming from: Home  Chief Complaint:  Chief Complaint  Patient presents with   Fall   Weakness   HPI: [name] is a 89 y.o. male with medical history significant of myasthenia gravis on CellCept, hypertension, TIA, hyperlipidemia, HFpEF, BPH, type 2 diabetes, chronic hyponatremia, who presents to the ED due to generalized weakness.  Mr. Betsch states that several days ago he had a ground-level fall after his legs became weak and he describes them as "crumbling."  At the time, he was not wearing his life alert and his family was unaware of his fall, so he laid on his stomach for 5 to 6 hours.  Since then, he has been feeling increasing weakness.  He states that his bilateral lower extremities are chronically weak, but now he notices some weakness in his upper extremities as well.  He notes focal right lower extremity weakness but this has been chronic since September.  In addition, he has been experiencing bilateral chest pain that radiates around to the back that is worse with exertion and certain movements.  In the past few days, he has been having some nausea when eating and has had poor appetite due to this.  He denies any fever, chills, vomiting, diarrhea, abdominal pain, dizziness, cough or shortness of breath.  He did choke on some Cheerios but states that it was mostly in his throat and he did not feel that he was choking.  ED course: On arrival to the ED, patient was afebrile at 97.7 with blood pressure 135/67 and heart rate of 78.  He was saturating at 96% on room air. Initial workup remarkable for sodium of 124, chloride of 90, glucose of 199, BNP at 155, troponin 16, osmolality of 269, WBC of 19.9 with hemoglobin of 11.2.  CT head was obtained that did not show any acute intracranial  abnormalities.  CTA of the chest was obtained that did not show any acute PE but did demonstrate an acute nondisplaced fracture of the bilateral seventh ribs, a small bilateral pleural effusions with adjacent opacity in addition to a 2.4 cm gallstone in the gallbladder neck.  Due to these findings, general surgery was consulted and a right upper quadrant was obtained that demonstrated mild thickening of the gallbladder wall.  HIDA scan was recommended.  [facility] contacted for admission for generalized weakness with concern for aspiration pneumonia.  Review of Systems: As mentioned in the history of present illness. All other systems reviewed and are negative.  Past Medical History:  Diagnosis Date   Actinic keratosis 02/01/2021   midline anterior chin    Allergic rhinitis    Basal cell carcinoma 01/29/2007   Right mid forehead. Excised: 03/24/2007, margins free.   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 Excised 07/27/2014, residual focus AFX, margins  free.   Squamous cell carcinoma of skin 04/01/2007   Right forehead, 2cm above mid brow. WD SCC wtih superficial infiltration. Excised: 05/14/2007, margins free   Squamous cell carcinoma of skin 05/16/2016   Crown. WD SCC, ulcerated. Excised: 07/03/2016, residual MD SCC, deep margin involved. Excised: 07/17/2016, margins free.   TIA (transient ischemic attack) 09/13/2015   Urinary outflow obstruction    mild   Past Surgical History:  Procedure Laterality Date   PARTIAL  HIP ARTHROPLASTY  01/2019   PILONIDAL CYST EXCISION  1951   removal   ROTATOR CUFF REPAIR  1995   SKIN CANCER EXCISION     multiple   SQUAMOUS CELL CARCINOMA EXCISION     behind head   TONSILLECTOMY AND ADENOIDECTOMY  1938   Social History:  reports that he has never smoked. He has never used smokeless tobacco. He reports that he does not drink alcohol and does not use drugs.  Allergies  Allergen Reactions   Magnesium Other (See Comments)    Myasthenia gravis   Penicillamine Other (See Comments)    Myasthenia gravis    Family History  Problem Relation Age of Onset   Heart disease Father        heart atack   Alzheimer's disease Mother    CVA Brother     Prior to Admission medications   Medication Sig Start Date End Date Taking? Authorizing Provider  Accu-Chek FastClix Lancets MISC USE 1  TO CHECK SUGARS ONCE DAILY. Dx E11.9 01/17/22   Einar Pheasant, MD  amLODipine (NORVASC) 10 MG tablet Take 1 tablet (10 mg total) by mouth daily. 07/27/22   Einar Pheasant, MD  aspirin 81 MG EC tablet Take 81 mg by mouth daily.    [provider]  calcium-vitamin D (OSCAL WITH D) 500-200 MG-UNIT tablet Take 1 tablet by mouth 2 (two) times daily.    [provider]  ferrous sulfate 325 (65 FE) MG EC tablet Take 325 mg by mouth. Takes three times weekly.    [provider]  finasteride (PROSCAR) 5 MG tablet Take 1 tablet by mouth once daily 10/10/22   Billey Co, MD  furosemide (LASIX) 20 MG tablet TAKE 1 TABLET BY MOUTH ONCE DAILY AS NEEDED FOR SHORTNESS OF BREATH OR  SWELLING 09/04/22   Minna Merritts, MD  gabapentin (NEURONTIN) 100 MG capsule Take 1 capsule (100 mg total) by mouth at bedtime. 08/01/22   Einar Pheasant, MD  glucose blood (ACCU-CHEK GUIDE) test strip USE 1 STRIP TO CHECK GLUCOSE ONCE DAILY DX E 11.9 05/10/22   Einar Pheasant, MD  lovastatin (MEVACOR) 20 MG tablet Take 1 tablet (20 mg total) by mouth at bedtime. 10/29/22   Kennyth Arnold, FNP   metFORMIN (GLUCOPHAGE-XR) 500 MG 24 hr tablet Take 1 tablet by mouth once daily with breakfast 10/19/22   Einar Pheasant, MD  mometasone (ELOCON) 0.1 % cream Apply to rash on abdomen once or twice a day prn 04/06/21   Ralene Bathe, MD  mycophenolate (CELLCEPT) 500 MG tablet Take 1,000 mg by mouth 2 (two) times daily. 05/15/21   [provider]  olmesartan (BENICAR) 40 MG tablet Take 1 tablet by mouth once daily 10/03/22   Einar Pheasant, MD  tamsulosin (FLOMAX) 0.4 MG CAPS capsule TAKE 1 CAPSULE BY MOUTH ONCE DAILY AFTER  SUPPER 10/10/22   Billey Co, MD    Physical Exam: Vitals:   11/06/22 1535 11/06/22 1539  BP:  135/67  Pulse:  78  Resp:  18  Temp:  97.7 F (36.5 C)  TempSrc:  Oral  SpO2:  96%  Weight: 93 kg   Height: '5\' 10"'$  (1.778 m)    Physical Exam Vitals and nursing note reviewed.  Constitutional:      General: He is not in acute distress.    Appearance: He is normal weight. He is not toxic-appearing.  HENT:     Head: Normocephalic and atraumatic.     Mouth/Throat:     Mouth: Mucous membranes are moist.     Pharynx: Oropharynx is clear.  Eyes:     Extraocular Movements: Extraocular movements intact.     Conjunctiva/sclera: Conjunctivae normal.     Pupils: Pupils are equal, round, and reactive to light.  Cardiovascular:     Rate and Rhythm: Normal rate and regular rhythm.     Heart sounds: No murmur heard.    No gallop.  Pulmonary:     Effort: Pulmonary effort is normal. No respiratory distress.     Breath sounds: Rales (Minimal rales in the right base) present. No wheezing or rhonchi.  Chest:     Comments: Tenderness to palpation along the seventh rib bilaterally Abdominal:     Palpations: Abdomen is soft.     Tenderness: There is abdominal tenderness (very minimal TTP with deep palpation of the RUQ) in the right upper quadrant. There is no guarding. Negative signs include Murphy's sign.  Musculoskeletal:     Right lower leg: No edema.      Left lower leg: No edema.  Skin:    General: Skin is warm and dry.  Neurological:     Mental Status: He is alert and oriented to person, place, and time.     Comments:  Patient is alert and oriented x 3 Bilateral upper extremities 4/5 strength Right lower extremity 3/5 Left lower extremity 4 out of 5 Sensation intact throughout No facial asymmetry, dysarthria  Psychiatric:        Mood and Affect: Mood normal.        Behavior: Behavior normal.        Thought Content: Thought content normal.        Judgment: Judgment normal.    Data Reviewed: CBC with WBC of 19.9, hemoglobin of 11.2, platelets of 402 CMP with sodium of 124, potassium of 3.7, chloride of 90, glucose of 199, BUN at 22, creatinine of 0.82, albumin of 3.0, AST 37, ALT of 24, total bili 1.3 with a GFR over 60. BNP elevated at 155 Troponin elevated at 16 with downtrend to 11 CK within normal limits at 278 Lactic acid 1.9 Serum osmolality 269 COVID-19, influenza and RSV PCR negative Urinalysis with mild proteinuria and hyaline casts  EKG with personally reviewed.  Sinus rhythm with first-degree AV block.  Sinus arrhythmia noted with frequent PACs.  US Abdomen Limited RUQ (LIVER/GB)  Result Date: 11/06/2022 CLINICAL DATA:  Right upper quadrant pain EXAM: ULTRASOUND ABDOMEN LIMITED RIGHT UPPER QUADRANT COMPARISON:  None Available. FINDINGS: Gallbladder: There is an immobile stone within the gallbladder neck throughout the study. The gallbladder wall measures 4.1 mm. No Murphy's sign or pericholecystic fluid. Common bile duct: Diameter: 2.7 mm Liver: No focal lesion identified. Within normal limits in parenchymal echogenicity. Portal vein is patent on color Doppler imaging with normal direction of blood flow towards the liver. Other: None. IMPRESSION: 1. There is a stone in the neck of the gallbladder throughout the study with mild thickening of the gallbladder wall measuring 4.1 mm. No  Murphy's sign or pericholecystic fluid. If  the clinical picture is ambiguous, a HIDA scan could further evaluate for acute cholecystitis. Electronically Signed   By: Dorise Bullion III M.D.   On: 11/06/2022 19:40   CT Angio Chest Pulmonary Embolism (PE) W or WO Contrast  Result Date: 11/06/2022 CLINICAL DATA:  Fall 5 days ago.  Dry cough and mild chest pain. EXAM: CT ANGIOGRAPHY CHEST WITH CONTRAST TECHNIQUE: Multidetector CT imaging of the chest was performed using the standard protocol during bolus administration of intravenous contrast. Multiplanar CT image reconstructions and MIPs were obtained to evaluate the vascular anatomy. RADIATION DOSE REDUCTION: This exam was performed according to the departmental dose-optimization program which includes automated exposure control, adjustment of the mA and/or kV according to patient size and/or use of iterative reconstruction technique. CONTRAST:  37m OMNIPAQUE IOHEXOL 350 MG/ML SOLN COMPARISON:  Same-day chest radiograph, lumbar spine MRI 07/25/2022 FINDINGS: Cardiovascular: There is adequate opacification of the pulmonary arteries to the segmental level. There is no evidence of pulmonary embolism. The heart size is normal. There is no significant pericardial effusion. Coronary artery calcifications and mild calcified plaque in the thoracic aorta are noted. Mediastinum/Nodes: The imaged thyroid is unremarkable. The esophagus is grossly unremarkable. There is a small hiatal hernia. There is no mediastinal, hilar, or axillary lymphadenopathy. Lungs/Pleura: The trachea and central airways are patent. Lung volumes are low. There is no overt pulmonary edema. There are small right larger than left pleural effusions with adjacent opacity likely reflecting atelectasis. The upper lobes and right middle lobe are well aerated. There is no suspicious nodule. There is no pneumothorax. Upper Abdomen: There is are acute nondisplaced fractures of the bilateral anterior seventh ribs. There is compression deformity of the  T12 vertebral body and partially imaged compression deformity of the L1 vertebral body, unchanged since the prior lumbar spine MRI. There is multilevel degenerative change of the thoracic spine. Musculoskeletal: There is a 2.4 cm gallstone in the gallbladder neck with distention of the gallbladder, wall thickening, and surrounding fat stranding. Review of the MIP images confirms the above findings. IMPRESSION: 1. No evidence of pulmonary embolism. 2. Acute nondisplaced fractures of the bilateral anterior seventh ribs. No pneumothorax. 3. Small right larger than left pleural effusions with adjacent opacity likely reflecting atelectasis. 4. Findings consistent with acute cholecystitis with a 2.4 cm gallstone in the gallbladder neck. Electronically Signed   By: PValetta MoleM.D.   On: 11/06/2022 18:36   DG Chest 2 View  Result Date: 11/06/2022 CLINICAL DATA:  Cough, weakness EXAM: CHEST - 2 VIEW COMPARISON:  Previous studies including the chest radiograph done on 08/01/2020, CT done on 10/28/2015 FINDINGS: Transverse diameter of heart is in the upper limits of normal. Right hemidiaphragm is elevated which may be due to eventration or paralysis. Increased interstitial markings are seen in the lower lung fields, more so on the right side. There is interval clearing of interstitial infiltrates in both parahilar regions. There is no pleural effusion or pneumothorax. IMPRESSION: There are no signs of pulmonary edema or focal pulmonary consolidation. Increased interstitial markings are seen in the lower lung fields, more so on the right side suggesting scarring or interstitial pneumonia. Electronically Signed   By: PElmer PickerM.D.   On: 11/06/2022 14:55   CT Head Wo Contrast  Result Date: 11/06/2022 CLINICAL DATA:  Headache with neuro deficit. EXAM: CT HEAD WITHOUT CONTRAST TECHNIQUE: Contiguous axial images were obtained from the base of the skull through the vertex without intravenous contrast.  RADIATION  DOSE REDUCTION: This exam was performed according to the departmental dose-optimization program which includes automated exposure control, adjustment of the mA and/or kV according to patient size and/or use of iterative reconstruction technique. COMPARISON:  None Available. FINDINGS: Brain: No acute intracranial hemorrhage. No focal mass lesion. No CT evidence of acute infarction. No midline shift or mass effect. No hydrocephalus. Basilar cisterns are patent. There are periventricular and subcortical white matter hypodensities. Generalized cortical atrophy. Vascular: No hyperdense vessel or unexpected calcification. Skull: Normal. Negative for fracture or focal lesion. Sinuses/Orbits: Paranasal sinuses and mastoid air cells are clear. Orbits are clear. Other: None. IMPRESSION: 1. No acute intracranial findings. 2. Atrophy and mild microvascular disease. Electronically Signed   By: Suzy Bouchard M.D.   On: 11/06/2022 14:45    There are no new results to review at this time.  Assessment and Plan:  * Generalized weakness Patient is presenting with several day weakness after a ground-level fall that he states his legs "crumbled".  On examination, his right lower extremity is weaker than the left, but this is chronic.  Etiology is uncertain but likely multifactorial in the setting of muscle weakness after prolonged downtime with the fall, dehydration, and hyponatremia.  No exam findings to suggest a myasthenia gravis flare or CVA  - IV fluids as noted below - PT/OT  Hyponatremia Patient has a history of chronic hyponatremia with acute on chronic worsening today with sodium of 124.  He appears dehydrated on examination in the setting of poor p.o. intake.  Mildly low serum osmolality noted.  No indication at this time for hypertonic saline.  - Gentle IV normal saline overnight - Sodium checks every 8 hours - Urine studies still pending  Leukocytosis Presenting with elevated WBC at 19.  Initially  there were concern for aspiration pneumonia given possible recent choking event and bilateral pleural effusion seen on imaging.  No prior imaging to see if these tiny effusions are chronic.  Patient and his family at bedside adamantly deny any shortness of breath or coughing the last week.  Etiology differential includes aspiration pneumonitis versus asymptomatic cholecystitis given large gallstone in the gallbladder neck versus atelectasis from rib fracture.  - Discontinue antimicrobials at this time - Low threshold to restart if patient should develop a fever, or becomes symptomatic - Repeat CBC in the a.m.  Cholecystitis On CT imaging, there was noted to be a 2.4 cm gallstone in the gallbladder neck with surrounding edema and gallbladder wall thickening.  Patient is generally asymptomatic with only very minimal tenderness to deep palpation.  LFTs within normal limits  - General surgery consulted; appreciate their recommendations - HIDA scan ordered - Hold off on further antimicrobials at this time  Myasthenia gravis [facility]) Patient states he has been in remission for years and does not believe he has ever had a flare.  -Continue home CellCept  Rib fracture CT imaging notable for acute nondisplaced fractures of bilateral anterior seventh ribs.  Likely from recent fall although patient believes it may be from a viral illness that he had several weeks ago.  - Pain control with Norco - Incentive spirometry  Diabetes mellitus (HCC) - SSI, moderate - Hold home metformin  Advance Care Planning:   Code Status: DNR.  Patient states that in the setting of cardiac arrest, he would not want resuscitation, however he is unsure how he feels about intubation and would like it as an option at this time until he is able to speak to his  family more.  Consults: General surgery  Family Communication: Patient's sons updated at bedside  Severity of Illness: The appropriate patient status for this  patient is OBSERVATION. Observation status is judged to be reasonable and necessary in order to provide the required intensity of service to ensure the patient's safety. The patient's presenting symptoms, physical exam findings, and initial radiographic and laboratory data in the context of their medical condition is felt to place them at decreased risk for further clinical deterioration. Furthermore, it is anticipated that the patient will be medically stable for discharge from the hospital within 2 midnights of admission.   Author: Jose Persia, MD 11/06/2022 9:54 PM  For on call review [email or web address].

## 2022-11-07 ENCOUNTER — Observation Stay: Payer: Medicare Other

## 2022-11-07 DIAGNOSIS — Z85828 Personal history of other malignant neoplasm of skin: Secondary | ICD-10-CM | POA: Diagnosis not present

## 2022-11-07 DIAGNOSIS — Z8249 Family history of ischemic heart disease and other diseases of the circulatory system: Secondary | ICD-10-CM | POA: Diagnosis not present

## 2022-11-07 DIAGNOSIS — N400 Enlarged prostate without lower urinary tract symptoms: Secondary | ICD-10-CM | POA: Diagnosis present

## 2022-11-07 DIAGNOSIS — Z743 Need for continuous supervision: Secondary | ICD-10-CM | POA: Diagnosis not present

## 2022-11-07 DIAGNOSIS — R278 Other lack of coordination: Secondary | ICD-10-CM | POA: Diagnosis not present

## 2022-11-07 DIAGNOSIS — J9811 Atelectasis: Secondary | ICD-10-CM | POA: Diagnosis not present

## 2022-11-07 DIAGNOSIS — I959 Hypotension, unspecified: Secondary | ICD-10-CM | POA: Diagnosis not present

## 2022-11-07 DIAGNOSIS — E7800 Pure hypercholesterolemia, unspecified: Secondary | ICD-10-CM | POA: Diagnosis present

## 2022-11-07 DIAGNOSIS — E559 Vitamin D deficiency, unspecified: Secondary | ICD-10-CM | POA: Diagnosis present

## 2022-11-07 DIAGNOSIS — Z7982 Long term (current) use of aspirin: Secondary | ICD-10-CM | POA: Diagnosis not present

## 2022-11-07 DIAGNOSIS — R2689 Other abnormalities of gait and mobility: Secondary | ICD-10-CM | POA: Diagnosis not present

## 2022-11-07 DIAGNOSIS — R531 Weakness: Secondary | ICD-10-CM | POA: Diagnosis not present

## 2022-11-07 DIAGNOSIS — M6281 Muscle weakness (generalized): Secondary | ICD-10-CM | POA: Diagnosis not present

## 2022-11-07 DIAGNOSIS — K8020 Calculus of gallbladder without cholecystitis without obstruction: Secondary | ICD-10-CM | POA: Diagnosis not present

## 2022-11-07 DIAGNOSIS — S2231XD Fracture of one rib, right side, subsequent encounter for fracture with routine healing: Secondary | ICD-10-CM | POA: Diagnosis not present

## 2022-11-07 DIAGNOSIS — J811 Chronic pulmonary edema: Secondary | ICD-10-CM | POA: Diagnosis not present

## 2022-11-07 DIAGNOSIS — E871 Hypo-osmolality and hyponatremia: Secondary | ICD-10-CM | POA: Diagnosis present

## 2022-11-07 DIAGNOSIS — R0902 Hypoxemia: Secondary | ICD-10-CM | POA: Diagnosis not present

## 2022-11-07 DIAGNOSIS — R109 Unspecified abdominal pain: Secondary | ICD-10-CM | POA: Diagnosis not present

## 2022-11-07 DIAGNOSIS — S2232XD Fracture of one rib, left side, subsequent encounter for fracture with routine healing: Secondary | ICD-10-CM | POA: Diagnosis not present

## 2022-11-07 DIAGNOSIS — E119 Type 2 diabetes mellitus without complications: Secondary | ICD-10-CM | POA: Diagnosis present

## 2022-11-07 DIAGNOSIS — K219 Gastro-esophageal reflux disease without esophagitis: Secondary | ICD-10-CM | POA: Diagnosis present

## 2022-11-07 DIAGNOSIS — R41841 Cognitive communication deficit: Secondary | ICD-10-CM | POA: Diagnosis not present

## 2022-11-07 DIAGNOSIS — E860 Dehydration: Secondary | ICD-10-CM | POA: Diagnosis present

## 2022-11-07 DIAGNOSIS — Z820 Family history of epilepsy and other diseases of the nervous system: Secondary | ICD-10-CM | POA: Diagnosis not present

## 2022-11-07 DIAGNOSIS — Z823 Family history of stroke: Secondary | ICD-10-CM | POA: Diagnosis not present

## 2022-11-07 DIAGNOSIS — R079 Chest pain, unspecified: Secondary | ICD-10-CM | POA: Diagnosis not present

## 2022-11-07 DIAGNOSIS — Z66 Do not resuscitate: Secondary | ICD-10-CM | POA: Diagnosis present

## 2022-11-07 DIAGNOSIS — R0602 Shortness of breath: Secondary | ICD-10-CM | POA: Diagnosis not present

## 2022-11-07 DIAGNOSIS — I509 Heart failure, unspecified: Secondary | ICD-10-CM | POA: Diagnosis not present

## 2022-11-07 DIAGNOSIS — Z1152 Encounter for screening for COVID-19: Secondary | ICD-10-CM | POA: Diagnosis not present

## 2022-11-07 DIAGNOSIS — I440 Atrioventricular block, first degree: Secondary | ICD-10-CM | POA: Diagnosis present

## 2022-11-07 DIAGNOSIS — Z8673 Personal history of transient ischemic attack (TIA), and cerebral infarction without residual deficits: Secondary | ICD-10-CM | POA: Diagnosis not present

## 2022-11-07 DIAGNOSIS — K8000 Calculus of gallbladder with acute cholecystitis without obstruction: Secondary | ICD-10-CM | POA: Diagnosis present

## 2022-11-07 DIAGNOSIS — S2243XA Multiple fractures of ribs, bilateral, initial encounter for closed fracture: Secondary | ICD-10-CM | POA: Diagnosis present

## 2022-11-07 DIAGNOSIS — E861 Hypovolemia: Secondary | ICD-10-CM | POA: Diagnosis present

## 2022-11-07 DIAGNOSIS — I110 Hypertensive heart disease with heart failure: Secondary | ICD-10-CM | POA: Diagnosis present

## 2022-11-07 DIAGNOSIS — W1830XA Fall on same level, unspecified, initial encounter: Secondary | ICD-10-CM | POA: Diagnosis present

## 2022-11-07 DIAGNOSIS — Z79899 Other long term (current) drug therapy: Secondary | ICD-10-CM | POA: Diagnosis not present

## 2022-11-07 DIAGNOSIS — Z7984 Long term (current) use of oral hypoglycemic drugs: Secondary | ICD-10-CM | POA: Diagnosis not present

## 2022-11-07 DIAGNOSIS — I5032 Chronic diastolic (congestive) heart failure: Secondary | ICD-10-CM | POA: Diagnosis present

## 2022-11-07 DIAGNOSIS — G7000 Myasthenia gravis without (acute) exacerbation: Secondary | ICD-10-CM | POA: Diagnosis present

## 2022-11-07 LAB — BASIC METABOLIC PANEL
Anion gap: 9 (ref 5–15)
Anion gap: 8 (ref 5–15)
BUN: 16 mg/dL (ref 8–23)
BUN: 19 mg/dL (ref 8–23)
CO2: 23 mmol/L (ref 22–32)
CO2: 24 mmol/L (ref 22–32)
Calcium: 8.1 mg/dL — ABNORMAL LOW (ref 8.9–10.3)
Calcium: 7.9 mg/dL — ABNORMAL LOW (ref 8.9–10.3)
Chloride: 91 mmol/L — ABNORMAL LOW (ref 98–111)
Chloride: 93 mmol/L — ABNORMAL LOW (ref 98–111)
Creatinine, Ser: 0.54 mg/dL — ABNORMAL LOW (ref 0.61–1.24)
Creatinine, Ser: 0.61 mg/dL (ref 0.61–1.24)
GFR, Estimated: >60 mL/min (ref >60)
GFR, Estimated: >60 mL/min (ref >60)
Glucose, Bld: 121 mg/dL — ABNORMAL HIGH (ref 70–99)
Glucose, Bld: 165 mg/dL — ABNORMAL HIGH (ref 70–99)
Potassium: 3.8 mmol/L (ref 3.5–5.1)
Potassium: 4.2 mmol/L (ref 3.5–5.1)
Sodium: 123 mmol/L — ABNORMAL LOW (ref 135–145)
Sodium: 125 mmol/L — ABNORMAL LOW (ref 135–145)

## 2022-11-07 LAB — CBC WITH DIFFERENTIAL/PLATELET
Abs Immature Granulocytes: 0.11 10*3/uL — ABNORMAL HIGH (ref 0.00–0.07)
Basophils Absolute: 0.1 10*3/uL (ref 0.0–0.1)
Basophils Relative: 0 %
Eosinophils Absolute: 0 10*3/uL (ref 0.0–0.5)
Eosinophils Relative: 0 %
HCT: 28.5 % — ABNORMAL LOW (ref 39.0–52.0)
Hemoglobin: 9.7 g/dL — ABNORMAL LOW (ref 13.0–17.0)
Immature Granulocytes: 1 %
Lymphocytes Relative: 4 %
Lymphs Abs: 0.8 10*3/uL (ref 0.7–4.0)
MCH: 30.8 pg (ref 26.0–34.0)
MCHC: 34 g/dL (ref 30.0–36.0)
MCV: 90.5 fL (ref 80.0–100.0)
Monocytes Absolute: 1.2 10*3/uL — ABNORMAL HIGH (ref 0.1–1.0)
Monocytes Relative: 7 %
Neutro Abs: 16.4 10*3/uL — ABNORMAL HIGH (ref 1.7–7.7)
Neutrophils Relative %: 88 %
Platelets: 353 10*3/uL (ref 150–400)
RBC: 3.15 MIL/uL — ABNORMAL LOW (ref 4.22–5.81)
RDW: 12.9 % (ref 11.5–15.5)
WBC: 18.6 10*3/uL — ABNORMAL HIGH (ref 4.0–10.5)
nRBC: 0 % (ref 0.0–0.2)

## 2022-11-07 LAB — CBG MONITORING, ED
Glucose-Capillary: 132 mg/dL — ABNORMAL HIGH (ref 70–99)
Glucose-Capillary: 109 mg/dL — ABNORMAL HIGH (ref 70–99)
Glucose-Capillary: 92 mg/dL (ref 70–99)
Glucose-Capillary: 134 mg/dL — ABNORMAL HIGH (ref 70–99)
Glucose-Capillary: 171 mg/dL — ABNORMAL HIGH (ref 70–99)

## 2022-11-07 LAB — FOLATE: Folate: 6.1 ng/mL (ref >5.9)

## 2022-11-07 LAB — VITAMIN D 25 HYDROXY (VIT D DEFICIENCY, FRACTURES): Vit D, 25-Hydroxy: 24.67 ng/mL — ABNORMAL LOW (ref 30–100)

## 2022-11-07 LAB — PROCALCITONIN: Procalcitonin: 0.57 ng/mL

## 2022-11-07 LAB — VITAMIN B12: Vitamin B-12: 876 pg/mL (ref 180–914)

## 2022-11-07 MED ORDER — FOLIC ACID 1 MG PO TABS
1.0000 mg | ORAL_TABLET | Freq: Every day | ORAL | Status: DC
  Administered 2022-11-07 – 2022-11-13 (×7): 1 mg via ORAL
  Filled 2022-11-07 (×7): qty 1

## 2022-11-07 MED ORDER — TECHNETIUM TC 99M MEBROFENIN IV KIT
5.2700 | PACK | Freq: Once | INTRAVENOUS | Status: AC | PRN
  Administered 2022-11-07: 5.27 via INTRAVENOUS

## 2022-11-07 MED ORDER — MORPHINE SULFATE (PF) 4 MG/ML IV SOLN
3.0000 mg | Freq: Once | INTRAVENOUS | Status: AC
  Administered 2022-11-07: 3 mg via INTRAVENOUS
  Filled 2022-11-07: qty 1

## 2022-11-07 MED ORDER — SODIUM CHLORIDE 0.9 % IV SOLN: INTRAVENOUS | Status: DC

## 2022-11-07 MED ORDER — ENOXAPARIN SODIUM 40 MG/0.4ML IJ SOSY
40.0000 mg | PREFILLED_SYRINGE | Freq: Every day | INTRAMUSCULAR | Status: DC
  Administered 2022-11-07 – 2022-11-12 (×6): 40 mg via SUBCUTANEOUS
  Filled 2022-11-07 (×6): qty 0.4

## 2022-11-07 MED ORDER — SODIUM CHLORIDE 1 G PO TABS
1.0000 g | ORAL_TABLET | Freq: Three times a day (TID) | ORAL | Status: DC
  Administered 2022-11-07 (×2): 1 g via ORAL
  Filled 2022-11-07 (×3): qty 1

## 2022-11-07 MED ORDER — VITAMIN D (ERGOCALCIFEROL) 1.25 MG (50000 UNIT) PO CAPS
50000.0000 [IU] | ORAL_CAPSULE | ORAL | Status: DC
  Administered 2022-11-07: 50000 [IU] via ORAL
  Filled 2022-11-07: qty 1

## 2022-11-07 NOTE — Evaluation (Signed)
Occupational Therapy Evaluation Patient Details Name: [name] MRN: [id number] DOB: [birth year] Today's Date: 11/07/2022   History of Present Illness presented to ER secondary to fall and progressive weakness; admitted for management of generalized weakness (likely multifactorial from prolonged down-time after fall, dehydration, hyponatremia).  Also noted with cholelithiasis and possible cholecystitis (work up ongoing).  Also noted with anterior rib fractures to bilat 7th ribs (non-displaced) and bilat pleural effusion (R > L)   Clinical Impression   Chart reviewed, pt greeted in bed with PT present agreeable to OT evaluation. Pt is alert and oriented x4, son present throughout. PTA pt is generally MOD I with ADL however has been requiring assist due to acute illness. Pt presents with deficits in strength, endurance, activity tolerance, balance affecting safe and optimal ADL completion. Recommend STR at this time to address functional deficits and to facilitate return to PLOF. OT will continue to follow acutely.      Recommendations for follow up therapy are one component of a multi-disciplinary discharge planning process, led by the attending physician.  Recommendations may be updated based on patient status, additional functional criteria and insurance authorization.   Follow Up Recommendations  Skilled nursing-short term rehab (<3 hours/day)     Assistance Recommended at Discharge Frequent or constant Supervision/Assistance  Patient can return home with the following A lot of help with walking and/or transfers;A lot of help with bathing/dressing/bathroom    Functional Status Assessment  Patient has had a recent decline in their functional status and demonstrates the ability to make significant improvements in function in a reasonable and predictable amount of time.  Equipment Recommendations  Other (comment) (defer)    Recommendations for Other Services       Precautions /  Restrictions Precautions Precautions: Fall Restrictions Weight Bearing Restrictions: No      Mobility Bed Mobility Overal bed mobility: Needs Assistance Bed Mobility: Supine to Sit, Sit to Supine     Supine to sit: Mod assist Sit to supine: Mod assist        Transfers Overall transfer level: Needs assistance Equipment used: Rolling walker (2 wheels) Transfers: Sit to/from Stand Sit to Stand: Min assist, Mod assist, +2 safety/equipment                  Balance Overall balance assessment: Needs assistance Sitting-balance support: No upper extremity supported, Feet supported Sitting balance-Leahy Scale: Good     Standing balance support: Bilateral upper extremity supported Standing balance-Leahy Scale: Poor                             ADL either performed or assessed with clinical judgement   ADL Overall ADL's : Needs assistance/impaired                     Lower Body Dressing: Maximal assistance Lower Body Dressing Details (indicate cue type and reason): socks     Toileting- Clothing Manipulation and Hygiene: Maximal assistance               Vision Patient Visual Report: No change from baseline       Perception     Praxis      Pertinent Vitals/Pain Pain Assessment Pain Assessment: Faces Faces Pain Scale: Hurts even more Pain Location: bilat ribs Pain Descriptors / Indicators: Grimacing, Guarding Pain Intervention(s): Limited activity within patient's tolerance, Monitored during session, Repositioned     Hand Dominance Right   Extremity/Trunk Assessment  Upper Extremity Assessment Upper Extremity Assessment: Overall WFL for tasks assessed   Lower Extremity Assessment Lower Extremity Assessment: Generalized weakness;Defer to PT evaluation       Communication Communication Communication: No difficulties   Cognition Arousal/Alertness: Awake/alert Behavior During Therapy: WFL for tasks assessed/performed Overall  Cognitive Status: Within Functional Limits for tasks assessed                                 General Comments: HOH     General Comments  spo2 >90% throughout    Exercises     Shoulder Instructions      Home Living Family/patient expects to be discharged to:: Private residence Living Arrangements: Alone Available Help at Discharge: Family Type of Home: House Home Access: [place]: One level     Bathroom Shower/Tub: [place]: [place] (2 wheels);Standard Walker          Prior Functioning/Environment Prior Level of Function : Independent/Modified Independent             Mobility Comments: intermittent use of RW for ADLs, amb in household; ADLs Comments: generally MOD I with ADL, due to acute illnessess requiring assist for ADL; assist for IADLs        OT Problem List: Decreased strength;Decreased activity tolerance;Decreased knowledge of use of DME or AE;Decreased safety awareness;Decreased cognition      OT Treatment/Interventions: Self-care/ADL training;DME and/or AE instruction;Therapeutic activities;Balance training;Therapeutic exercise;Energy conservation;Patient/family education    OT Goals(Current goals can be found in the care plan section) Acute Rehab OT Goals Patient Stated Goal: get stronger OT Goal Formulation: With patient/family Time For Goal Achievement: 11/21/22 Potential to Achieve Goals: Good ADL Goals Pt Will Perform Grooming: with modified independence;sitting;standing Pt Will Perform Lower Body Dressing: with modified independence;sit to/from stand Pt Will Transfer to Toilet: with modified independence Pt Will Perform Toileting - Clothing Manipulation and hygiene: with modified independence;sit to/from stand  OT Frequency: Min 2X/week    Co-evaluation PT/OT/SLP Co-Evaluation/Treatment: Yes Reason for Co-Treatment: Necessary to address  cognition/behavior during functional activity;For patient/therapist safety;To address functional/ADL transfers PT goals addressed during session: Mobility/safety with mobility OT goals addressed during session: ADL's and self-care      AM-PAC OT "6 Clicks" Daily Activity     Outcome Measure Help from another person eating meals?: None Help from another person taking care of personal grooming?: A Little Help from another person toileting, which includes using toliet, bedpan, or urinal?: A Lot Help from another person bathing (including washing, rinsing, drying)?: A Lot Help from another person to put on and taking off regular upper body clothing?: A Little Help from another person to put on and taking off regular lower body clothing?: A Lot 6 Click Score: 16   End of Session Equipment Utilized During Treatment: Gait belt;Rolling walker (2 wheels);Oxygen Nurse Communication: Mobility status  Activity Tolerance: Patient tolerated treatment well Patient left: in bed;with call bell/phone within reach;with bed alarm set  OT Visit Diagnosis: Unsteadiness on feet (R26.81);Muscle weakness (generalized) (M62.81)                Time: 3825-0539 OT Time Calculation (min): 18 min Charges:  OT General Charges $OT Visit: 1 Visit OT Evaluation $OT Eval Low Complexity: 1 Low Shanon Payor, OTD OTR/L  11/07/22, 12:01 PM

## 2022-11-07 NOTE — Evaluation (Signed)
Physical Therapy Evaluation Patient Details Name: [name] MRN: [id number] DOB: October 27, 1926 Today's Date: 11/07/2022  History of Present Illness  presented to ER secondary to fall and progressive weakness; admitted for management of generalized weakness (likely multifactorial from prolonged down-time after fall, dehydration, hyponatremia).  Also noted with cholelithiasis and possible cholecystitis (work up ongoing).  Also noted with anterior rib fractures to bilat 7th ribs (non-displaced) and bilat pleural effusion (R > L)  Clinical Impression  Patient resting in bed upon arrival to hallway space in ED; supportive son present at bedside throughout session.  Patient initially sleeping, but opens eyes/awakens to voice; oriented to basic information, follows commands and agreeable to participation with treatment session.  Endorses aching, soreness throughout bilat ribs (due to acute fractures); improved with rest and repositioning in bed.  Generally weak and deconditioned throughout all extremities with increased weakness to R LE (recent baseline for patient after recent shingles episode) and 0/5 ankle DF bilat (wears foot-up bracing for ankle support).  Currently requiring mod assist for bed mobility (did attempt/encourage log rolling); close sup for unsupported sitting; mod assist +2 for sit/stand, standing balance and lateral stepping with RW.  Demonstrates lateral stepping edge of bed, min/mod assist +2. Unable to fully lift/unweight and advance LEs due to generalized weakness, relying on sliding/scooting LEs across floor to achieve lateral movement. Fatigues very quickly and requires return to seated position after 2-3 steps.   Sats >92% on supplemental O2 throughout session. Would benefit from skilled PT to address above deficits and promote optimal return to PLOF.; recommend transition to STR upon discharge from acute hospitalization.      Recommendations for follow up therapy are one  component of a multi-disciplinary discharge planning process, led by the attending physician.  Recommendations may be updated based on patient status, additional functional criteria and insurance authorization.  Follow Up Recommendations Skilled nursing-short term rehab (<3 hours/day) Can patient physically be transported by private vehicle: No    Assistance Recommended at Discharge Frequent or constant Supervision/Assistance  Patient can return home with the following  A lot of help with walking and/or transfers;A lot of help with bathing/dressing/bathroom    Equipment Recommendations    Recommendations for Other Services       Functional Status Assessment Patient has had a recent decline in their functional status and demonstrates the ability to make significant improvements in function in a reasonable and predictable amount of time.     Precautions / Restrictions Precautions Precautions: Fall Restrictions Weight Bearing Restrictions: No      Mobility  Bed Mobility Overal bed mobility: Needs Assistance Bed Mobility: Supine to Sit, Sit to Supine     Supine to sit: Mod assist Sit to supine: Mod assist   General bed mobility comments: educated/assisted with log-rolling technique; limited ability to actively assist with truncal elevation due to pain    Transfers Overall transfer level: Needs assistance Equipment used: Rolling walker (2 wheels) Transfers: Sit to/from Stand Sit to Stand: Min assist, Mod assist, +2 safety/equipment                Ambulation/Gait Ambulation/Gait assistance: Min assist, Mod assist Gait Distance (Feet): 3 Feet Assistive device: Rolling walker (2 wheels)         General Gait Details: lateral stepping edge of bed, min/mod assist +2.  Unable to fully lift/unweight and advance LEs due to generalized weakness, relying on sliding/scooting LEs across floor to achieve lateral movement.  Fatigues very quickly and requires return to seated  position after 2-3 steps  Stairs            Wheelchair Mobility    Modified Rankin (Stroke Patients Only)       Balance Overall balance assessment: Needs assistance Sitting-balance support: No upper extremity supported, Feet supported Sitting balance-Leahy Scale: Good     Standing balance support: Bilateral upper extremity supported Standing balance-Leahy Scale: Poor                               Pertinent Vitals/Pain Pain Assessment Pain Assessment: Faces Faces Pain Scale: Hurts even more Pain Location: bilat ribs Pain Descriptors / Indicators: Grimacing, Guarding Pain Intervention(s): Limited activity within patient's tolerance, Monitored during session, Repositioned    Home Living Family/patient expects to be discharged to:: Private residence Living Arrangements: Alone Available Help at Discharge: Family Type of Home: House Home Access: Ramped entrance       Home Layout: One level Home Equipment: Shower seat - built Medical sales representative (2 wheels);Standard Walker      Prior Function Prior Level of Function : Independent/Modified Independent             Mobility Comments: Intermittent use of SW/RW for ADLs, household mobilization; does not drive; does endorse at least 1 fall in recent weeks (with prolonged down time).  Was participating with outpatient PT services prior to admission due to baseline R LE weakness       Hand Dominance   Dominant Hand: Right    Extremity/Trunk Assessment   Upper Extremity Assessment Upper Extremity Assessment: Overall WFL for tasks assessed (grossly 4+/5 throughout)    Lower Extremity Assessment Lower Extremity Assessment: Generalized weakness (grossly 3+/5 throughout, generally effortful.  R ankle DF, R toe ext 0/5; L ankle DF 0/5, L toe ext 3-/5.  does wear bilat toe-off bracing/supports to bilat ankles at [place])       Communication   Communication: No difficulties  Cognition Arousal/Alertness:  Awake/alert Behavior During Therapy: WFL for tasks assessed/performed Overall Cognitive Status: Within Functional Limits for tasks assessed                                          General Comments      Exercises     Assessment/Plan    PT Assessment Patient needs continued PT services  PT Problem List Decreased strength;Decreased activity tolerance;Decreased balance;Decreased mobility;Decreased coordination;Decreased knowledge of use of DME;Decreased safety awareness;Decreased knowledge of precautions;Cardiopulmonary status limiting activity       PT Treatment Interventions DME instruction;Gait training;Stair training;Functional mobility training;Therapeutic activities;Therapeutic exercise;Balance training;Patient/family education    PT Goals (Current goals can be found in the Care Plan section)  Acute Rehab PT Goals Patient Stated Goal: to get stronger PT Goal Formulation: With patient/family Time For Goal Achievement: 11/21/22 Potential to Achieve Goals: Good    Frequency Min 2X/week     Co-evaluation PT/OT/SLP Co-Evaluation/Treatment: Yes Reason for Co-Treatment: To address functional/ADL transfers PT goals addressed during session: Mobility/safety with mobility OT goals addressed during session: ADL's and self-care       AM-PAC PT "6 Clicks" Mobility  Outcome Measure Help needed turning from your back to your side while in a flat bed without using bedrails?: A Little Help needed moving from lying on your back to sitting on the side of a flat bed without using bedrails?: A Lot Help needed  moving to and from a bed to a chair (including a wheelchair)?: A Lot Help needed standing up from a chair using your arms (e.g., wheelchair or bedside chair)?: A Lot Help needed to walk in hospital room?: A Lot Help needed climbing 3-5 steps with a railing? : A Lot 6 Click Score: 13    End of Session Equipment Utilized During Treatment: Gait belt Activity  Tolerance: Patient tolerated treatment well Patient left: in bed;with call bell/phone within reach;with family/visitor present Nurse Communication: Mobility status PT Visit Diagnosis: Muscle weakness (generalized) (M62.81);Difficulty in walking, not elsewhere classified (R26.2);History of falling (Z91.81)    Time: 3094-0768 PT Time Calculation (min) (ACUTE ONLY): 26 min   Charges:   PT Evaluation $PT Eval Moderate Complexity: 1 Mod         Juandaniel Manfredo H. Owens Shark, PT, DPT, NCS 11/07/22, 11:40 AM [phone]

## 2022-11-07 NOTE — ED Notes (Signed)
Patient placed on hospital bed and repositioned to right side at this time. Patient's son remains at bedside. Patient denies pain or any needs other than stating he is hungry.

## 2022-11-07 NOTE — Progress Notes (Signed)
Subjective:  CC: [name] is a 89 y.o. male  Hospital stay day 0,   weakness  HPI: No new complaints today.  ROS:  General: Denies weight loss, weight gain, fatigue, fevers, chills, and night sweats. Heart: Denies chest pain, palpitations, racing heart, irregular heartbeat, leg pain or swelling, and decreased activity tolerance. Respiratory: Denies breathing difficulty, shortness of breath, wheezing, cough, and sputum. GI: Denies change in appetite, heartburn, nausea, vomiting, constipation, diarrhea, and blood in stool. GU: Denies difficulty urinating, pain with urinating, urgency, frequency, blood in urine.   Objective:   Temp:  [97.7 F (36.5 C)-99.4 F (37.4 C)] 99.4 F (37.4 C) (01/03 0622) Pulse Rate:  [78-88] 86 (01/03 0622) Resp:  [18-23] 20 (01/03 0622) BP: (114-153)/(46-77) 114/77 (01/03 0622) SpO2:  [93 %-96 %] 94 % (01/03 0622) Weight:  [93 kg] 93 kg (01/02 1535)     Height: '5\' 10"'$  (177.8 cm) Weight: 93 kg BMI (Calculated): 29.42   Intake/Output this shift:   Intake/Output Summary (Last 24 hours) at 11/07/2022 1154 Last data filed at 11/07/2022 0620 Gross per 24 hour  Intake 50 ml  Output 300 ml  Net -250 ml    Constitutional :  alert, cooperative, appears stated age, and no distress  Respiratory:  clear to auscultation bilaterally  Cardiovascular:  regular rate and rhythm  Gastrointestinal: soft, non-tender; bowel sounds normal; no masses,  no organomegaly.   Skin: Cool and moist.   Psychiatric: Normal affect, non-agitated, not confused       LABS:     Latest Ref Rng & Units 11/07/2022    4:45 AM 11/06/2022    2:23 PM 08/28/2022   10:44 AM  CMP  Glucose 70 - 99 mg/dL 121  199  141   BUN 8 - 23 mg/dL '16  22  11   '$ Creatinine 0.61 - 1.24 mg/dL 0.54  0.82  0.64   Sodium 135 - 145 mmol/L 123  124  132   Potassium 3.5 - 5.1 mmol/L 3.8  3.7  3.9   Chloride 98 - 111 mmol/L 91  90  96   CO2 22 - 32 mmol/L '23  24  26   '$ Calcium 8.9 - 10.3 mg/dL 8.1  8.7   8.9   Total Protein 6.5 - 8.1 g/dL  7.6    Total Bilirubin 0.3 - 1.2 mg/dL  1.3    Alkaline Phos 38 - 126 U/L  57    AST 15 - 41 U/L  37    ALT 0 - 44 U/L  24        Latest Ref Rng & Units 11/07/2022    4:45 AM 11/06/2022    2:23 PM 08/23/2022   12:06 PM  CBC  WBC 4.0 - 10.5 K/uL 18.6  19.9  6.3   Hemoglobin 13.0 - 17.0 g/dL 9.7  11.2  11.6   Hematocrit 39.0 - 52.0 % 28.5  33.3  35.0   Platelets 150 - 400 K/uL 353  402  430.0     RADS: Narrative & Impression  CLINICAL DATA:  Abdominal pain. Distended gallbladder on CT. Gallstones noted on ultrasound.   EXAM: NUCLEAR MEDICINE HEPATOBILIARY IMAGING   TECHNIQUE: Sequential images of the abdomen were obtained out to 60 minutes following intravenous administration of radiopharmaceutical.   RADIOPHARMACEUTICALS:  5.3 mCi Tc-26m Choletec IV   COMPARISON:  CT and ultrasound 11/06/2022   FINDINGS: Prompt uptake of radiotracer in liver. Counts are evident in the proximal small bowel by  30 minutes. No gallbladder evident at 45 minutes. At 45 minutes, 3 mg of IV morphine was administered to augment filling of the gallbladder. The gallbladder begins to fill after morphine augmentation.   IMPRESSION: 1. Filling of the gallbladder after morphine augmentation indicates a patent cystic duct. Findings are NOT consistent with acute cholecystitis. 2. Patent common bile duct.     Electronically Signed   By: Suzy Bouchard M.D.   On: 11/07/2022 10:31   Assessment:   Weakness, leukocytosis, concern for cholecystitis on imaging.  HIDA negative.  Pt still has no abdominal pain complaints.  GB unlikely to be etiology for recent symptoms.  Will sign off.  Call with questions or concerns.  labs/images/medications/previous chart entries reviewed personally and relevant changes/updates noted above.

## 2022-11-07 NOTE — ED Notes (Signed)
Patient oxygen sat decreased to 87-88% on RA during rest. Patient placed on 2L oxygen [place] and sat increased to 91%

## 2022-11-07 NOTE — Progress Notes (Signed)
[facility] Progress Note  Patient: [name]    UEA:[id number]  DOA: 11/06/2022     Date of Service: the patient was seen and examined on 11/07/2022  Chief Complaint  Patient presents with   Fall   Weakness   Brief hospital course: [name] is a 89 y.o. male with medical history significant of myasthenia gravis on CellCept, hypertension, TIA, hyperlipidemia, HFpEF, BPH, type 2 diabetes, chronic hyponatremia, who presents to the ED due to generalized weakness.  ED workup: Hyponatremia sodium 124, leukocytosis  CTA chest negative for PE, shows gallstone, general surgery was consulted, HIDA scan was ordered.  Patient received 1 dose of antibiotics.  Further management as below.   Assessment and Plan:  Generalized weakness, unknown cause could be due to hyponatremia CT head negative for any acute findings Continue fall precautions PT OT eval done, recommend SNF placement   Chronic hypotonic hyponatremia Sodium 124 on admission Na 124--123 Serum osmolality 269, BNP 155, CK 278 wnl Started sodium chloride 1 g p.o. 3 times daily, continue fluid restriction  Leukocytosis, unknown cause On CT imaging, there was noted to be a 2.4 cm gallstone in the gallbladder neck with surrounding edema and gallbladder wall thickening.  Suspected cholecystitis, noted, HIDA scan negative [place] abd shows tiny gallstone, no any other acute findings UA negative CXR There are no signs of pulmonary edema or focal pulmonary consolidation. Increased interstitial markings are seen in the lower lung fields, more so on the right side suggesting scarring or interstitial pneumonia. Negative COVID, RSV and influenza S/p Zosyn one-time dose given in the ED Hold off antibiotics, trend WBC count General surgery was consulted, recommended no intervention and signed off. Check procalcitonin level   Myasthenia gravis [facility]) Patient states he has been in remission for years and does not believe  he has ever had a flare.   -Continue home CellCept   Rib fracture CT imaging notable for acute nondisplaced fractures of bilateral anterior seventh ribs.  Likely from recent fall although patient believes it may be from a viral illness that he had several weeks ago. - Pain control with Norco - Incentive spirometry   Diabetes mellitus ([place]) - SSI, moderate - Hold home metformin  Folic acid level 6.1, at lower end, started folic acid 1 g p.o. daily. Vitamin B12 level 876, within normal range. Vitamin D insufficiency: started vitamin D 50,000 units p.o. weekly, follow with PCP to repeat vitamin D level after 3 to 6 months.   Body mass index is 29.42 kg/m.   Interventions:       Diet: Carb modified DVT Prophylaxis: Subcutaneous Lovenox   Advance goals of care discussion: DNR  Family Communication: family was present at bedside, at the time of interview.  The pt provided permission to discuss medical plan with the family. Opportunity was given to ask question and all questions were answered satisfactorily.   Disposition:  Pt is from Home, admitted with fall and generalized weakness, hyponatremia, still has low sodium, which precludes a safe discharge. Discharge to SNF, when clinically stable, may need 1-2 more days.  Subjective: No significant overnight events, patient denies any abdominal pain, no nausea vomiting or diarrhea, no chest pain or palpitation, no shortness of breath. Patient was resting comfortably.  Physical Exam: General:  alert oriented to time, place, and person.  Appear in no distress, affect appropriate Eyes: PERRLA ENT: Oral Mucosa Clear, moist  Neck: no JVD,  Cardiovascular: S1 and S2 Present, no Murmur,  Respiratory: good  respiratory effort, Bilateral Air entry equal and Decreased, no Crackles, no wheezes Abdomen: Bowel Sound present, Soft and no tenderness,  Skin: no rashes Extremities: no Pedal edema, no calf tenderness Neurologic: without any new  focal findings Gait not checked due to patient safety concerns  Vitals:   11/07/22 0400 11/07/22 0530 11/07/22 0622 11/07/22 0622  BP: (!) 121/49 (!) 125/46  114/77  Pulse: 82 79  86  Resp: '20 20  20  '$ Temp:   99.4 F (37.4 C)   TempSrc:   Oral   SpO2: 93% 94%  94%  Weight:      Height:        Intake/Output Summary (Last 24 hours) at 11/07/2022 1640 Last data filed at 11/07/2022 0620 Gross per 24 hour  Intake 50 ml  Output 300 ml  Net -250 ml   Filed Weights   11/06/22 1535  Weight: 93 kg    Data Reviewed: I have personally reviewed and interpreted daily labs, tele strips, imagings as discussed above. I reviewed all nursing notes, pharmacy notes, vitals, pertinent old records I have discussed plan of care as described above with RN and patient/family.  CBC: Recent Labs  Lab 11/06/22 1423 11/07/22 0445  WBC 19.9* 18.6*  NEUTROABS 17.3* 16.4*  HGB 11.2* 9.7*  HCT 33.3* 28.5*  MCV 91.5 90.5  PLT 402* 528   Basic Metabolic Panel: Recent Labs  Lab 11/06/22 1423 11/07/22 0445  NA 124* 123*  K 3.7 3.8  CL 90* 91*  CO2 24 23  GLUCOSE 199* 121*  BUN 22 16  CREATININE 0.82 0.54*  CALCIUM 8.7* 8.1*    Studies: NM Hepatobiliary Liver Func  Result Date: 11/07/2022 CLINICAL DATA:  Abdominal pain. Distended gallbladder on CT. Gallstones noted on ultrasound. EXAM: NUCLEAR MEDICINE HEPATOBILIARY IMAGING TECHNIQUE: Sequential images of the abdomen were obtained out to 60 minutes following intravenous administration of radiopharmaceutical. RADIOPHARMACEUTICALS:  5.3 mCi Tc-27m Choletec IV COMPARISON:  CT and ultrasound 11/06/2022 FINDINGS: Prompt uptake of radiotracer in liver. Counts are evident in the proximal small bowel by 30 minutes. No gallbladder evident at 45 minutes. At 45 minutes, 3 mg of IV morphine was administered to augment filling of the gallbladder. The gallbladder begins to fill after morphine augmentation. IMPRESSION: 1. Filling of the gallbladder after  morphine augmentation indicates a patent cystic duct. Findings are NOT consistent with acute cholecystitis. 2. Patent common bile duct. Electronically Signed   By: SSuzy BouchardM.D.   On: 11/07/2022 10:31   [place]domen Limited RUQ (LIVER/GB)  Result Date: 11/06/2022 CLINICAL DATA:  Right upper quadrant pain EXAM: ULTRASOUND ABDOMEN LIMITED RIGHT UPPER QUADRANT COMPARISON:  None Available. FINDINGS: Gallbladder: There is an immobile stone within the gallbladder neck throughout the study. The gallbladder wall measures 4.1 mm. No Murphy's sign or pericholecystic fluid. Common bile duct: Diameter: 2.7 mm Liver: No focal lesion identified. Within normal limits in parenchymal echogenicity. Portal vein is patent on color Doppler imaging with normal direction of blood flow towards the liver. Other: None. IMPRESSION: 1. There is a stone in the neck of the gallbladder throughout the study with mild thickening of the gallbladder wall measuring 4.1 mm. No Murphy's sign or pericholecystic fluid. If the clinical picture is ambiguous, a HIDA scan could further evaluate for acute cholecystitis. Electronically Signed   By: DDorise BullionIII M.D.   On: 11/06/2022 19:40   CT Angio Chest Pulmonary Embolism (PE) W or WO Contrast  Result Date: 11/06/2022 CLINICAL DATA:  Fall  5 days ago.  Dry cough and mild chest pain. EXAM: CT ANGIOGRAPHY CHEST WITH CONTRAST TECHNIQUE: Multidetector CT imaging of the chest was performed using the standard protocol during bolus administration of intravenous contrast. Multiplanar CT image reconstructions and MIPs were obtained to evaluate the vascular anatomy. RADIATION DOSE REDUCTION: This exam was performed according to the departmental dose-optimization program which includes automated exposure control, adjustment of the mA and/or kV according to patient size and/or use of iterative reconstruction technique. CONTRAST:  51m OMNIPAQUE IOHEXOL 350 MG/ML SOLN COMPARISON:  Same-day chest  radiograph, lumbar spine MRI 07/25/2022 FINDINGS: Cardiovascular: There is adequate opacification of the pulmonary arteries to the segmental level. There is no evidence of pulmonary embolism. The heart size is normal. There is no significant pericardial effusion. Coronary artery calcifications and mild calcified plaque in the thoracic aorta are noted. Mediastinum/Nodes: The imaged thyroid is unremarkable. The esophagus is grossly unremarkable. There is a small hiatal hernia. There is no mediastinal, hilar, or axillary lymphadenopathy. Lungs/Pleura: The trachea and central airways are patent. Lung volumes are low. There is no overt pulmonary edema. There are small right larger than left pleural effusions with adjacent opacity likely reflecting atelectasis. The upper lobes and right middle lobe are well aerated. There is no suspicious nodule. There is no pneumothorax. Upper Abdomen: There is are acute nondisplaced fractures of the bilateral anterior seventh ribs. There is compression deformity of the T12 vertebral body and partially imaged compression deformity of the L1 vertebral body, unchanged since the prior lumbar spine MRI. There is multilevel degenerative change of the thoracic spine. Musculoskeletal: There is a 2.4 cm gallstone in the gallbladder neck with distention of the gallbladder, wall thickening, and surrounding fat stranding. Review of the MIP images confirms the above findings. IMPRESSION: 1. No evidence of pulmonary embolism. 2. Acute nondisplaced fractures of the bilateral anterior seventh ribs. No pneumothorax. 3. Small right larger than left pleural effusions with adjacent opacity likely reflecting atelectasis. 4. Findings consistent with acute cholecystitis with a 2.4 cm gallstone in the gallbladder neck. Electronically Signed   By: PValetta MoleM.D.   On: 11/06/2022 18:36    Scheduled Meds:  amLODipine  10 mg Oral QHS   finasteride  5 mg Oral QHS   gabapentin  100 mg Oral QHS   insulin  aspart  0-15 Units Subcutaneous TID WC   irbesartan  300 mg Oral Daily   mycophenolate  1,000 mg Oral BID   pravastatin  20 mg Oral q1800   sodium chloride  1 g Oral TID WC   tamsulosin  0.4 mg Oral QPC breakfast   Continuous Infusions: PRN Meds: acetaminophen **OR** acetaminophen, HYDROcodone-acetaminophen, ondansetron **OR** ondansetron (ZOFRAN) IV, polyethylene glycol  Time spent: 50 minutes  Author: DVal Riles MD [facility] 11/07/2022 4:40 PM  To reach On-call, see care teams to locate the attending and reach out to them via [email or web address] If 7PM-7AM, please contact night-coverage If you still have difficulty reaching the attending provider, please page the [facility](Director on Call) for [facility] on amion for assistance.

## 2022-11-07 NOTE — ED Notes (Signed)
Patient diaper changed and patient placed on male external cath. Patient gown also changed

## 2022-11-07 NOTE — Telephone Encounter (Signed)
Spoke with daughter, and [name] went to the hospital yesterday. He was admitted at [facility] with several problems. She mentioned pneumonia & gall bladder issue.  9am appt slot not needed.

## 2022-11-07 NOTE — ED Notes (Signed)
Pt returned from Nuclear Medicine. Requesting diet order.

## 2022-11-07 NOTE — Progress Notes (Signed)
PT Cancellation Note  Patient Details Name: [name] MRN: [id number] DOB: 17-Nov-1925   Cancelled Treatment:    Reason Eval/Treat Not Completed: Patient at procedure or test/unavailable (Consult received and chart reviewed. Patient currrently off unit for diagnostic testing; will re-attempt at later time/date as medically appropriate and available.)   Jaycee Mckellips H. Owens Shark, PT, DPT, NCS 11/07/22, 9:45 AM [phone]

## 2022-11-08 ENCOUNTER — Inpatient Hospital Stay: Payer: Medicare Other

## 2022-11-08 ENCOUNTER — Inpatient Hospital Stay (HOSPITAL_COMMUNITY)
Admit: 2022-11-08 | Discharge: 2022-11-08 | Disposition: A | Discharge status: Home / Self Care | Payer: Medicare Other | Attending: Student | Admitting: Student

## 2022-11-08 DIAGNOSIS — I509 Heart failure, unspecified: Secondary | ICD-10-CM

## 2022-11-08 DIAGNOSIS — R531 Weakness: Secondary | ICD-10-CM | POA: Diagnosis not present

## 2022-11-08 LAB — CBC
HCT: 28.9 % — ABNORMAL LOW (ref 39.0–52.0)
Hemoglobin: 9.8 g/dL — ABNORMAL LOW (ref 13.0–17.0)
MCH: 30.5 pg (ref 26.0–34.0)
MCHC: 33.9 g/dL (ref 30.0–36.0)
MCV: 90 fL (ref 80.0–100.0)
Platelets: 334 10*3/uL (ref 150–400)
RBC: 3.21 MIL/uL — ABNORMAL LOW (ref 4.22–5.81)
RDW: 13 % (ref 11.5–15.5)
WBC: 17.2 10*3/uL — ABNORMAL HIGH (ref 4.0–10.5)
nRBC: 0 % (ref 0.0–0.2)

## 2022-11-08 LAB — BASIC METABOLIC PANEL
Anion gap: 8 (ref 5–15)
Anion gap: 10 (ref 5–15)
BUN: 21 mg/dL (ref 8–23)
BUN: 18 mg/dL (ref 8–23)
CO2: 24 mmol/L (ref 22–32)
CO2: 22 mmol/L (ref 22–32)
Calcium: 8.1 mg/dL — ABNORMAL LOW (ref 8.9–10.3)
Calcium: 8 mg/dL — ABNORMAL LOW (ref 8.9–10.3)
Chloride: 93 mmol/L — ABNORMAL LOW (ref 98–111)
Chloride: 92 mmol/L — ABNORMAL LOW (ref 98–111)
Creatinine, Ser: 0.67 mg/dL (ref 0.61–1.24)
Creatinine, Ser: 0.6 mg/dL — ABNORMAL LOW (ref 0.61–1.24)
GFR, Estimated: >60 mL/min (ref >60)
GFR, Estimated: >60 mL/min (ref >60)
Glucose, Bld: 114 mg/dL — ABNORMAL HIGH (ref 70–99)
Glucose, Bld: 106 mg/dL — ABNORMAL HIGH (ref 70–99)
Potassium: 3.8 mmol/L (ref 3.5–5.1)
Potassium: 3.8 mmol/L (ref 3.5–5.1)
Sodium: 125 mmol/L — ABNORMAL LOW (ref 135–145)
Sodium: 124 mmol/L — ABNORMAL LOW (ref 135–145)

## 2022-11-08 LAB — ECHOCARDIOGRAM COMPLETE
AR max vel: 2.57 cm2
AV Area VTI: 2.75 cm2
AV Area mean vel: 2.82 cm2
AV Mean grad: 4 mmHg
AV Peak grad: 9.1 mmHg
Ao pk vel: 1.51 m/s
Area-P 1/2: 2.91 cm2
Height: 70 in
MV VTI: 1.99 cm2
S' Lateral: 2.4 cm
Weight: 3280.44 oz

## 2022-11-08 LAB — HEPATIC FUNCTION PANEL
ALT: 21 U/L (ref 0–44)
AST: 30 U/L (ref 15–41)
Albumin: 2.3 g/dL — ABNORMAL LOW (ref 3.5–5.0)
Alkaline Phosphatase: 51 U/L (ref 38–126)
Bilirubin, Direct: 0.3 mg/dL — ABNORMAL HIGH (ref 0.0–0.2)
Indirect Bilirubin: 0.8 mg/dL (ref 0.3–0.9)
Total Bilirubin: 1.1 mg/dL (ref 0.3–1.2)
Total Protein: 5.8 g/dL — ABNORMAL LOW (ref 6.5–8.1)

## 2022-11-08 LAB — CBG MONITORING, ED
Glucose-Capillary: 106 mg/dL — ABNORMAL HIGH (ref 70–99)
Glucose-Capillary: 147 mg/dL — ABNORMAL HIGH (ref 70–99)
Glucose-Capillary: 154 mg/dL — ABNORMAL HIGH (ref 70–99)

## 2022-11-08 LAB — BRAIN NATRIURETIC PEPTIDE: B Natriuretic Peptide: 316.6 pg/mL — ABNORMAL HIGH (ref 0.0–100.0)

## 2022-11-08 LAB — GLUCOSE, CAPILLARY: Glucose-Capillary: 109 mg/dL — ABNORMAL HIGH (ref 70–99)

## 2022-11-08 LAB — PHOSPHORUS: Phosphorus: 3.2 mg/dL (ref 2.5–4.6)

## 2022-11-08 LAB — MAGNESIUM: Magnesium: 2 mg/dL (ref 1.7–2.4)

## 2022-11-08 MED ORDER — SODIUM CHLORIDE 1 G PO TABS
1.0000 g | ORAL_TABLET | Freq: Three times a day (TID) | ORAL | Status: DC
  Administered 2022-11-08 – 2022-11-09 (×4): 1 g via ORAL
  Filled 2022-11-08 (×4): qty 1

## 2022-11-08 MED ORDER — FUROSEMIDE 10 MG/ML IJ SOLN
20.0000 mg | Freq: Two times a day (BID) | INTRAMUSCULAR | Status: AC
  Administered 2022-11-08 (×2): 20 mg via INTRAVENOUS
  Filled 2022-11-08: qty 4
  Filled 2022-11-08: qty 2

## 2022-11-08 NOTE — ED Notes (Signed)
Request made for transport to the floor ?

## 2022-11-08 NOTE — ED Notes (Addendum)
Report given to Patrick, RN

## 2022-11-08 NOTE — Progress Notes (Signed)
*  PRELIMINARY RESULTS* Echocardiogram 2D Echocardiogram has been performed.  [name] 11/08/2022, 2:20 PM

## 2022-11-08 NOTE — ED Notes (Signed)
Attempted to call to give report. Assigned nurse unable to take report at this time. Charge RN states that she will call back for report.

## 2022-11-08 NOTE — Progress Notes (Signed)
[facility] Progress Note  Patient: [name]    CHE:[id number]  DOA: 11/06/2022     Date of Service: the patient was seen and examined on 11/08/2022  Chief Complaint  Patient presents with   Fall   Weakness   Brief hospital course: [name] is a 89 y.o. male with medical history significant of myasthenia gravis on CellCept, hypertension, TIA, hyperlipidemia, HFpEF, BPH, type 2 diabetes, chronic hyponatremia, who presents to the ED due to generalized weakness.  ED workup: Hyponatremia sodium 124, leukocytosis  CTA chest negative for PE, shows gallstone, general surgery was consulted, HIDA scan was ordered.  Patient received 1 dose of antibiotics.  Further management as below.   Assessment and Plan:  Generalized weakness, unknown cause could be due to hyponatremia CT head negative for any acute findings Continue fall precautions PT OT eval done, recommend SNF placement   Chronic hypotonic hyponatremia Sodium 124 on admission Na 124--123--125 Serum osmolality 269 low, and CK 278 wnl BNP 155--316 slightly elevated Started sodium chloride 1 g p.o. 3 times daily, continue fluid restriction 1/4 Lasix 20 mg IV twice daily x 2 doses ordered Check 2D echocardiogram to rule out CHF   Leukocytosis, unknown cause, could be due to CellCept On CT imaging, there was noted to be a 2.4 cm gallstone in the gallbladder neck with surrounding edema and gallbladder wall thickening.  Suspected cholecystitis, noted, HIDA scan negative [place] abd shows tiny gallstone, no any other acute findings UA negative CXR There are no signs of pulmonary edema or focal pulmonary consolidation. Increased interstitial markings are seen in the lower lung fields, more so on the right side suggesting scarring or interstitial pneumonia. Negative COVID, RSV and influenza S/p Zosyn one-time dose given in the ED Hold off antibiotics, trend WBC count General surgery was consulted, recommended no  intervention and signed off. Procalcitonin level 0.57 at lower end   Myasthenia gravis Patient states he has been in remission for years and does not believe he has ever had a flare. -Continue home CellCept Check negative inspiratory pressure    Rib fracture CT imaging notable for acute nondisplaced fractures of bilateral anterior seventh ribs.  Likely from recent fall although patient believes it may be from a viral illness that he had several weeks ago. - Pain control with Norco - Incentive spirometry 1/4 CXR: No definite acute infiltrate, pleural effusion, or pneumothorax. Chronic bronchitic changes with increased bibasilar atelectasis.   Diabetes mellitus - SSI, moderate - Hold home metformin  Folic acid level 6.1, at lower end, started folic acid 1 g p.o. daily. Vitamin B12 level 876, within normal range. Vitamin D insufficiency: started vitamin D 50,000 units p.o. weekly, follow with PCP to repeat vitamin D level after 3 to 6 months.   Urinary retention, PVR 292 ml as per bladder scan done by RN Foley catheter was inserted on 1/4 Continue Proscar and Flomax  Body mass index is 29.42 kg/m.   Interventions:       Diet: Carb modified DVT Prophylaxis: Subcutaneous Lovenox   Advance goals of care discussion: DNR  Family Communication: family was present at bedside, at the time of interview.  The pt provided permission to discuss medical plan with the family. Opportunity was given to ask question and all questions were answered satisfactorily.   Disposition:  Pt is from Home, admitted with fall and generalized weakness, hyponatremia, still has low sodium, which precludes a safe discharge. Discharge to SNF, when clinically stable, may need 1-2  more days.  Subjective: No significant overnight events, patient denies any abdominal pain, no nausea vomiting or diarrhea, no chest pain or palpitation, no shortness of breath. Patient was resting comfortably.  Physical  Exam: General:  alert oriented to time, place, and person.  Appear in no distress, affect appropriate Eyes: PERRLA ENT: Oral Mucosa Clear, moist  Neck: no JVD,  Cardiovascular: S1 and S2 Present, no Murmur,  Respiratory: good respiratory effort, Bilateral Air entry equal and Decreased, no Crackles, no wheezes Abdomen: Bowel Sound present, Soft and no tenderness,  Skin: no rashes Extremities: no Pedal edema, no calf tenderness Neurologic: without any new focal findings Gait not checked due to patient safety concerns  Vitals:   11/08/22 0700 11/08/22 0846 11/08/22 1300 11/08/22 1600  BP: (!) 130/56 132/62 126/61 122/70  Pulse: 78  76 82  Resp: (!) 28  (!) 22 (!) 28  Temp:  99.3 F (37.4 C) 98.8 F (37.1 C) 98.5 F (36.9 C)  TempSrc:  Oral Oral Oral  SpO2:  95% 94% 94%  Weight:      Height:       No intake or output data in the 24 hours ending 11/08/22 1640  Filed Weights   11/06/22 1535  Weight: 93 kg    Data Reviewed: I have personally reviewed and interpreted daily labs, tele strips, imagings as discussed above. I reviewed all nursing notes, pharmacy notes, vitals, pertinent old records I have discussed plan of care as described above with RN and patient/family.  CBC: Recent Labs  Lab 11/06/22 1423 11/07/22 0445 11/08/22 0515  WBC 19.9* 18.6* 17.2*  NEUTROABS 17.3* 16.4*  --   HGB 11.2* 9.7* 9.8*  HCT 33.3* 28.5* 28.9*  MCV 91.5 90.5 90.0  PLT 402* 353 580   Basic Metabolic Panel: Recent Labs  Lab 11/06/22 1423 11/07/22 0445 11/07/22 2022 11/08/22 0515  NA 124* 123* 125* 125*  K 3.7 3.8 4.2 3.8  CL 90* 91* 93* 93*  CO2 '24 23 24 24  '$ GLUCOSE 199* 121* 165* 114*  BUN '22 16 19 21  '$ CREATININE 0.82 0.54* 0.61 0.67  CALCIUM 8.7* 8.1* 7.9* 8.1*  MG  --   --   --  2.0  PHOS  --   --   --  3.2    Studies: ECHOCARDIOGRAM COMPLETE  Result Date: 11/08/2022    ECHOCARDIOGRAM REPORT   Patient Name:   [name] Date of Exam: 11/08/2022 Medical Rec #:   [id number]           Height:       70.0 in Accession #:    [id number]          Weight:       205.0 lb Date of Birth:  August 19, 1926          BSA:          2.109 m Patient Age:    32 years            BP:           132/62 mmHg Patient Gender: M                   HR:           82 bpm. Exam Location:  [facility] Procedure: 2D Echo, Cardiac Doppler and Color Doppler Indications:     CHF  History:         Patient has prior history of Echocardiogram examinations, most  recent 08/22/2020. TIA and Stroke, Signs/Symptoms:Fatigue and                  Shortness of Breath; Risk Factors:Hypertension and Diabetes.  Sonographer:     Wenda Low Referring Phys:  [id number] Val Riles Diagnosing Phys: Ida Rogue MD IMPRESSIONS  1. Left ventricular ejection fraction, by estimation, is 55 to 60%. The left ventricle has normal function. The left ventricle has no regional wall motion abnormalities. Left ventricular diastolic parameters are indeterminate (TDI measurements concerning for grade II diastolic dysfunction).  2. Right ventricular systolic function is normal. The right ventricular size is normal. There is mildly elevated pulmonary artery systolic pressure. The estimated right ventricular systolic pressure is 67.6 mmHg.  3. The mitral valve is normal in structure. Mild mitral valve regurgitation. No evidence of mitral stenosis.  4. The aortic valve is normal in structure. Aortic valve regurgitation is not visualized. Aortic valve sclerosis is present, with no evidence of aortic valve stenosis.  5. The inferior vena cava is normal in size with greater than 50% respiratory variability, suggesting right atrial pressure of 3 mmHg. FINDINGS  Left Ventricle: Left ventricular ejection fraction, by estimation, is 55 to 60%. The left ventricle has normal function. The left ventricle has no regional wall motion abnormalities. The left ventricular internal cavity size was normal in size. There is  no left ventricular  hypertrophy. Left ventricular diastolic parameters are indeterminate. Right Ventricle: The right ventricular size is normal. No increase in right ventricular wall thickness. Right ventricular systolic function is normal. There is mildly elevated pulmonary artery systolic pressure. The tricuspid regurgitant velocity is 2.92  m/s, and with an assumed right atrial pressure of 3 mmHg, the estimated right ventricular systolic pressure is 19.5 mmHg. Left Atrium: Left atrial size was normal in size. Right Atrium: Right atrial size was normal in size. Pericardium: There is no evidence of pericardial effusion. Mitral Valve: The mitral valve is normal in structure. Mild mitral valve regurgitation. No evidence of mitral valve stenosis. MV peak gradient, 4.3 mmHg. The mean mitral valve gradient is 2.0 mmHg. Tricuspid Valve: The tricuspid valve is normal in structure. Tricuspid valve regurgitation is mild . No evidence of tricuspid stenosis. Aortic Valve: The aortic valve is normal in structure. Aortic valve regurgitation is not visualized. Aortic valve sclerosis is present, with no evidence of aortic valve stenosis. Aortic valve mean gradient measures 4.0 mmHg. Aortic valve peak gradient measures 9.1 mmHg. Aortic valve area, by VTI measures 2.75 cm. Pulmonic Valve: The pulmonic valve was normal in structure. Pulmonic valve regurgitation is not visualized. No evidence of pulmonic stenosis. Aorta: The aortic root is normal in size and structure. Venous: The inferior vena cava is normal in size with greater than 50% respiratory variability, suggesting right atrial pressure of 3 mmHg. IAS/Shunts: No atrial level shunt detected by color flow Doppler.  LEFT VENTRICLE PLAX 2D LVIDd:         4.40 cm   Diastology LVIDs:         2.40 cm   LV e' medial:    9.57 cm/s LV PW:         1.20 cm   LV E/e' medial:  11.7 LV IVS:        1.10 cm   LV e' lateral:   6.96 cm/s LVOT diam:     2.10 cm   LV E/e' lateral: 16.1 LV SV:         82 LV SV  Index:   39  LVOT Area:     3.46 cm  RIGHT VENTRICLE RV Basal diam:  3.35 cm RV Mid diam:    3.10 cm RV S prime:     15.50 cm/s TAPSE (M-mode): 2.4 cm LEFT ATRIUM             Index        RIGHT ATRIUM           Index LA diam:        3.90 cm 1.85 cm/m   RA Area:     18.20 cm LA Vol (A2C):   50.2 ml 23.80 ml/m  RA Volume:   49.30 ml  23.37 ml/m LA Vol (A4C):   49.4 ml 23.42 ml/m LA Biplane Vol: 53.2 ml 25.22 ml/m  AORTIC VALVE                    PULMONIC VALVE AV Area (Vmax):    2.57 cm     PV Vmax:       1.30 m/s AV Area (Vmean):   2.82 cm     PV Peak grad:  6.8 mmHg AV Area (VTI):     2.75 cm AV Vmax:           151.00 cm/s AV Vmean:          94.200 cm/s AV VTI:            0.298 m AV Peak Grad:      9.1 mmHg AV Mean Grad:      4.0 mmHg LVOT Vmax:         112.00 cm/s LVOT Vmean:        76.800 cm/s LVOT VTI:          0.237 m LVOT/AV VTI ratio: 0.80  AORTA Ao Root diam: 3.70 cm MITRAL VALVE                TRICUSPID VALVE MV Area (PHT): 2.91 cm     TR Peak grad:   34.1 mmHg MV Area VTI:   1.99 cm     TR Vmax:        292.00 cm/s MV Peak grad:  4.3 mmHg MV Mean grad:  2.0 mmHg     SHUNTS MV Vmax:       1.04 m/s     Systemic VTI:  0.24 m MV Vmean:      70.2 cm/s    Systemic Diam: 2.10 cm MV Decel Time: 261 msec MV E velocity: 112.00 cm/s MV A velocity: 94.30 cm/s MV E/A ratio:  1.19 Ida Rogue MD Electronically signed by Ida Rogue MD Signature Date/Time: 11/08/2022/4:24:52 PM    Final    DG Chest Port 1 View  Result Date: 11/08/2022 CLINICAL DATA:  Shortness of breath, hypoxemia EXAM: PORTABLE CHEST 1 VIEW COMPARISON:  Portable exam 0816 hours compared to 11/06/2022 FINDINGS: Upper normal heart size. Pulmonary vascular congestion. Atherosclerotic calcification aorta. Central peribronchial thickening, chronic Bibasilar atelectasis greater on RIGHT with chronic elevation of RIGHT diaphragm. No definite acute infiltrate, pleural effusion, or pneumothorax. Bones demineralized. IMPRESSION: Chronic bronchitic  changes with increased bibasilar atelectasis. Electronically Signed   By: Lavonia Dana M.D.   On: 11/08/2022 08:59    Scheduled Meds:  amLODipine  10 mg Oral QHS   enoxaparin (LOVENOX) injection  40 mg Subcutaneous QHS   finasteride  5 mg Oral QHS   folic acid  1 mg Oral Daily   furosemide  20 mg Intravenous Q12H   gabapentin  100 mg Oral QHS   insulin aspart  0-15 Units Subcutaneous TID WC   irbesartan  300 mg Oral Daily   mycophenolate  1,000 mg Oral BID   pravastatin  20 mg Oral q1800   sodium chloride  1 g Oral TID WC   tamsulosin  0.4 mg Oral QPC breakfast   Vitamin D (Ergocalciferol)  50,000 Units Oral Q7 days   Continuous Infusions: PRN Meds: acetaminophen **OR** acetaminophen, HYDROcodone-acetaminophen, ondansetron **OR** ondansetron (ZOFRAN) IV, polyethylene glycol  Time spent: 50 minutes  Author: Val Riles. MD [facility] 11/08/2022 4:40 PM  To reach On-call, see care teams to locate the attending and reach out to them via [email or web address]. If 7PM-7AM, please contact night-coverage If you still have difficulty reaching the attending provider, please page the [facility] (Director on Call) for [facility] on amion for assistance.

## 2022-11-08 NOTE — ED Notes (Signed)
Patient oxygen sat decreased to 88% on RA during rest. Patient placed on 2L [place]

## 2022-11-08 NOTE — Progress Notes (Signed)
Breakfast order placed per patient/family request

## 2022-11-09 DIAGNOSIS — R531 Weakness: Secondary | ICD-10-CM | POA: Diagnosis not present

## 2022-11-09 LAB — HEPATIC FUNCTION PANEL
ALT: 28 U/L (ref 0–44)
AST: 45 U/L — ABNORMAL HIGH (ref 15–41)
Albumin: 2.3 g/dL — ABNORMAL LOW (ref 3.5–5.0)
Alkaline Phosphatase: 59 U/L (ref 38–126)
Bilirubin, Direct: 0.2 mg/dL (ref 0.0–0.2)
Indirect Bilirubin: 0.9 mg/dL (ref 0.3–0.9)
Total Bilirubin: 1.1 mg/dL (ref 0.3–1.2)
Total Protein: 6 g/dL — ABNORMAL LOW (ref 6.5–8.1)

## 2022-11-09 LAB — BASIC METABOLIC PANEL
Anion gap: 10 (ref 5–15)
Anion gap: 11 (ref 5–15)
BUN: 17 mg/dL (ref 8–23)
BUN: 15 mg/dL (ref 8–23)
CO2: 24 mmol/L (ref 22–32)
CO2: 22 mmol/L (ref 22–32)
Calcium: 8 mg/dL — ABNORMAL LOW (ref 8.9–10.3)
Calcium: 8 mg/dL — ABNORMAL LOW (ref 8.9–10.3)
Chloride: 93 mmol/L — ABNORMAL LOW (ref 98–111)
Chloride: 93 mmol/L — ABNORMAL LOW (ref 98–111)
Creatinine, Ser: 0.55 mg/dL — ABNORMAL LOW (ref 0.61–1.24)
Creatinine, Ser: 0.6 mg/dL — ABNORMAL LOW (ref 0.61–1.24)
GFR, Estimated: >60 mL/min (ref >60)
GFR, Estimated: >60 mL/min (ref >60)
Glucose, Bld: 98 mg/dL (ref 70–99)
Glucose, Bld: 118 mg/dL — ABNORMAL HIGH (ref 70–99)
Potassium: 3.4 mmol/L — ABNORMAL LOW (ref 3.5–5.1)
Potassium: 4.5 mmol/L (ref 3.5–5.1)
Sodium: 127 mmol/L — ABNORMAL LOW (ref 135–145)
Sodium: 126 mmol/L — ABNORMAL LOW (ref 135–145)

## 2022-11-09 LAB — CBC
HCT: 27.3 % — ABNORMAL LOW (ref 39.0–52.0)
Hemoglobin: 9.3 g/dL — ABNORMAL LOW (ref 13.0–17.0)
MCH: 30.1 pg (ref 26.0–34.0)
MCHC: 34.1 g/dL (ref 30.0–36.0)
MCV: 88.3 fL (ref 80.0–100.0)
Platelets: 402 10*3/uL — ABNORMAL HIGH (ref 150–400)
RBC: 3.09 MIL/uL — ABNORMAL LOW (ref 4.22–5.81)
RDW: 13.2 % (ref 11.5–15.5)
WBC: 13.8 10*3/uL — ABNORMAL HIGH (ref 4.0–10.5)
nRBC: 0 % (ref 0.0–0.2)

## 2022-11-09 LAB — GLUCOSE, CAPILLARY
Glucose-Capillary: 105 mg/dL — ABNORMAL HIGH (ref 70–99)
Glucose-Capillary: 137 mg/dL — ABNORMAL HIGH (ref 70–99)
Glucose-Capillary: 131 mg/dL — ABNORMAL HIGH (ref 70–99)
Glucose-Capillary: 136 mg/dL — ABNORMAL HIGH (ref 70–99)
Glucose-Capillary: 192 mg/dL — ABNORMAL HIGH (ref 70–99)

## 2022-11-09 LAB — PHOSPHORUS: Phosphorus: 3.8 mg/dL (ref 2.5–4.6)

## 2022-11-09 LAB — MAGNESIUM: Magnesium: 1.8 mg/dL (ref 1.7–2.4)

## 2022-11-09 MED ORDER — FUROSEMIDE 10 MG/ML IJ SOLN
20.0000 mg | Freq: Two times a day (BID) | INTRAMUSCULAR | Status: AC
  Administered 2022-11-09 (×2): 20 mg via INTRAVENOUS
  Filled 2022-11-09 (×2): qty 2

## 2022-11-09 MED ORDER — POTASSIUM CHLORIDE CRYS ER 20 MEQ PO TBCR
40.0000 meq | EXTENDED_RELEASE_TABLET | Freq: Once | ORAL | Status: AC
  Administered 2022-11-09: 40 meq via ORAL
  Filled 2022-11-09: qty 2

## 2022-11-09 MED ORDER — SODIUM CHLORIDE 1 G PO TABS
1.0000 g | ORAL_TABLET | Freq: Three times a day (TID) | ORAL | Status: DC
  Administered 2022-11-09 – 2022-11-11 (×8): 1 g via ORAL
  Filled 2022-11-09 (×9): qty 1

## 2022-11-09 NOTE — NC FL2 (Signed)
[place] LEVEL OF CARE FORM     IDENTIFICATION  Patient Name: [name] Birthdate: 06/19/1926 Sex: male Admission Date (Current Location): 11/06/2022  [facility] and [place] Number:  Engineering geologist and Address:  [facility], [address], [place], [place] [address]      Provider Number:    Attending Physician Name and Address:  Val Riles, MD  Relative Name and Phone Number:  Margaretha Glassing [phone]    Current Level of Care: Hospital Recommended Level of Care: [place] Prior Approval Number:    Date Approved/Denied:   PASRR Number: pending  Discharge Plan: SNF    Current Diagnoses: Patient Active Problem List   Diagnosis Date Noted   Generalized weakness 11/06/2022   Leukocytosis 11/06/2022   Cholecystitis 11/06/2022   Rib fracture 11/06/2022   Foot drop 10/07/2022   Right-sided chest pain 05/27/2022   Hav (hallux abducto valgus), unspecified laterality 01/04/2022   Sleep difficulties 05/22/2021   Cerumen impaction 10/15/2020   Burning sensation of skin 01/31/2020   Cough 01/25/2020   Diabetic neuropathy ([place]) 09/10/2019   Carpal tunnel syndrome, right 05/22/2019   SOB (shortness of breath) 02/25/2018   Low back pain 10/03/2016   BPH (benign prostatic hyperplasia) 07/01/2016   Myasthenia gravis ([place]) 10/09/2015   Binocular vision disorder with diplopia 09/26/2015   Fatigue 09/26/2015   Upper extremity weakness 09/20/2015   TIA (transient ischemic attack) 09/13/2015   CVA (cerebral infarction) 09/13/2015   Cold feet 07/20/2015   Health care maintenance 01/25/2015   B12 deficiency 11/16/2014   Anemia 09/27/2014   BMI 34.0-34.9,adult 05/29/2014   Hyponatremia 09/20/2013   Diabetes mellitus ([place]) 12/06/2011   Hypercholesterolemia 08/15/2010   HYPERTENSION, BENIGN 08/15/2010   PALPITATIONS, OCCASIONAL 08/15/2010    Orientation RESPIRATION BLADDER Height & Weight     Self, Time,  Situation, Place (memory impairment)  Normal External catheter, Incontinent Weight: 205 lb 0.4 oz (93 kg) Height:  '5\' 10"'$  (177.8 cm)  BEHAVIORAL SYMPTOMS/MOOD NEUROLOGICAL BOWEL NUTRITION STATUS   (n/a)   Continent Diet  AMBULATORY STATUS COMMUNICATION OF NEEDS Skin   Limited Assist Verbally Normal                       Personal Care Assistance Level of Assistance  Bathing, Feeding, Dressing Bathing Assistance: Limited assistance Feeding assistance: Limited assistance Dressing Assistance: Limited assistance     Functional Limitations Info             [place]  PT (By licensed PT), OT (By licensed OT)     PT Frequency: 5x's/week OT Frequency: 5x's/week            Contractures Contractures Info: Not present    Additional Factors Info  Code Status, Allergies Code Status Info: DNR Allergies Info: Magnesium, Penicillamine           Current Medications (11/09/2022):  This is the current hospital active medication list Current Facility-Administered Medications  Medication Dose Route Frequency Provider Last Rate Last Admin   acetaminophen (TYLENOL) tablet 650 mg  650 mg Oral Q6H PRN Jose Persia, MD       Or   acetaminophen (TYLENOL) suppository 650 mg  650 mg Rectal Q6H PRN Jose Persia, MD       amLODipine (NORVASC) tablet 10 mg  10 mg Oral QHS Jose Persia, MD   10 mg at 11/08/22 2236   enoxaparin (LOVENOX) injection 40 mg  40 mg  Subcutaneous QHS Val Riles, MD   40 mg at 11/08/22 2236   finasteride (PROSCAR) tablet 5 mg  5 mg Oral QHS Jose Persia, MD   5 mg at 01/75/10 2585   folic acid (FOLVITE) tablet 1 mg  1 mg Oral Daily Val Riles, MD   1 mg at 11/09/22 0851   furosemide (LASIX) injection 20 mg  20 mg Intravenous Q12H Val Riles, MD   20 mg at 11/09/22 1009   gabapentin (NEURONTIN) capsule 100 mg  100 mg Oral QHS Jose Persia, MD   100 mg at 11/08/22 2236   HYDROcodone-acetaminophen (NORCO/VICODIN) 5-325 MG per  tablet 1 tablet  1 tablet Oral Q6H PRN Jose Persia, MD       insulin aspart (novoLOG) injection 0-15 Units  0-15 Units Subcutaneous TID WC Jose Persia, MD   3 Units at 11/08/22 1724   irbesartan (AVAPRO) tablet 300 mg  300 mg Oral Daily Jose Persia, MD   300 mg at 11/09/22 2778   mycophenolate (CELLCEPT) capsule 1,000 mg  1,000 mg Oral BID Jose Persia, MD   1,000 mg at 11/09/22 0851   ondansetron (ZOFRAN) tablet 4 mg  4 mg Oral Q6H PRN Jose Persia, MD       Or   ondansetron (ZOFRAN) injection 4 mg  4 mg Intravenous Q6H PRN Jose Persia, MD       polyethylene glycol (MIRALAX / GLYCOLAX) packet 17 g  17 g Oral Daily PRN Jose Persia, MD   17 g at 11/07/22 1830   pravastatin (PRAVACHOL) tablet 20 mg  20 mg Oral q1800 Jose Persia, MD   20 mg at 11/08/22 1730   sodium chloride tablet 1 g  1 g Oral TID WC Val Riles, MD       tamsulosin (FLOMAX) capsule 0.4 mg  0.4 mg Oral QPC breakfast Jose Persia, MD   0.4 mg at 11/09/22 2423   Vitamin D (Ergocalciferol) (DRISDOL) 1.25 MG (50000 UNIT) capsule 50,000 Units  50,000 Units Oral Q7 days Val Riles, MD   50,000 Units at 11/07/22 1927     Discharge Medications: Please see discharge summary for a list of discharge medications.  Relevant Imaging Results:  Relevant Lab Results:   Additional Information SS#: [id number]  Judeen Hammans Mosetta Ferdinand, LCSW

## 2022-11-09 NOTE — Progress Notes (Signed)
FVC andf NIF done at this time. FVC 2.09L. NIF -30cmH2O

## 2022-11-09 NOTE — Progress Notes (Signed)
Pt's NIF -35cm H2O, FVC 1.28L, effort was OK despite pt has broken ribs.

## 2022-11-09 NOTE — Progress Notes (Signed)
[facility] Progress Note  Patient: [name]    OZD:[id number]  DOA: 11/06/2022     Date of Service: the patient was seen and examined on 11/09/2022  Chief Complaint  Patient presents with   Fall   Weakness   Brief hospital course: [name] is a 89 y.o. male with medical history significant of myasthenia gravis on CellCept, hypertension, TIA, hyperlipidemia, HFpEF, BPH, type 2 diabetes, chronic hyponatremia, who presents to the ED due to generalized weakness.  ED workup: Hyponatremia sodium 124, leukocytosis  CTA chest negative for PE, shows gallstone, general surgery was consulted, HIDA scan was ordered.  Patient received 1 dose of antibiotics.  Further management as below.   Assessment and Plan:  Generalized weakness, unknown cause could be due to hyponatremia CT head negative for any acute findings Continue fall precautions PT OT eval done, recommend SNF placement   Chronic hypotonic hyponatremia Sodium 124 on admission Na 124--123--125--127 Serum osmolality 269 low, and CK 278 wnl BNP 155--316 slightly elevated Started sodium chloride 1 g p.o. 3 times daily, continue fluid restriction 1/4 Lasix 20 mg IV twice daily x 2 days TTE shows LVEF 55 to 25%, grade 1 diastolic dysfunction.   Leukocytosis, unknown cause, could be due to CellCept On CT imaging, there was noted to be a 2.4 cm gallstone in the gallbladder neck with surrounding edema and gallbladder wall thickening.  Suspected cholecystitis, noted, HIDA scan negative [place] abd shows tiny gallstone, no any other acute findings UA negative CXR There are no signs of pulmonary edema or focal pulmonary consolidation. Increased interstitial markings are seen in the lower lung fields, more so on the right side suggesting scarring or interstitial pneumonia. Negative COVID, RSV and influenza S/p Zosyn one-time dose given in the ED Hold off antibiotics, trend WBC count General surgery was consulted,  recommended no intervention and signed off. Procalcitonin level 0.57 at lower end   Myasthenia gravis Patient states he has been in remission for years and does not believe he has ever had a flare. -Continue home CellCept negative inspiratory pressure -35 Continue to monitor for worsening of myasthenia gravis symptoms.    Rib fracture CT imaging notable for acute nondisplaced fractures of bilateral anterior seventh ribs.  Likely from recent fall although patient believes it may be from a viral illness that he had several weeks ago. - Pain control with Norco - Incentive spirometry 1/4 CXR: No definite acute infiltrate, pleural effusion, or pneumothorax. Chronic bronchitic changes with increased bibasilar atelectasis.   Diabetes mellitus - SSI, moderate - Hold home metformin  Folic acid level 6.1, at lower end, started folic acid 1 g p.o. daily. Vitamin B12 level 876, within normal range. Vitamin D insufficiency: started vitamin D 50,000 units p.o. weekly, follow with PCP to repeat vitamin D level after 3 to 6 months.   Urinary retention, PVR 292 ml as per bladder scan done by RN Foley catheter was inserted on 1/4 Continue Proscar and Flomax  Body mass index is 29.42 kg/m.   Interventions:       Diet: Carb modified DVT Prophylaxis: Subcutaneous Lovenox   Advance goals of care discussion: DNR  Family Communication: family was present at bedside, at the time of interview.  The pt provided permission to discuss medical plan with the family. Opportunity was given to ask question and all questions were answered satisfactorily.   Disposition:  Pt is from Home, admitted with fall and generalized weakness, hyponatremia, still has low sodium, which precludes a  safe discharge. Discharge to SNF, when clinically stable, may need 1-2 more days.  Subjective: No significant overnight events, patient denies any abdominal pain, no nausea vomiting or diarrhea, no chest pain or  palpitation, no shortness of breath. Patient was resting comfortably.  Physical Exam: General:  alert oriented to time, place, and person.  Appear in no distress, affect appropriate Eyes: PERRLA ENT: Oral Mucosa Clear, moist  Neck: no JVD,  Cardiovascular: S1 and S2 Present, no Murmur,  Respiratory: good respiratory effort, Bilateral Air entry equal and Decreased, no Crackles, no wheezes Abdomen: Bowel Sound present, Soft and no tenderness,  Skin: no rashes Extremities: no Pedal edema, no calf tenderness Neurologic: without any new focal findings Gait not checked due to patient safety concerns  Vitals:   11/08/22 2207 11/09/22 0746 11/09/22 1000 11/09/22 1617  BP: (!) 136/55 (!) 130/58 (!) 121/53 (!) 136/56  Pulse: 73 72 68 66  Resp: '17 18  18  '$ Temp: 98.6 F (37 C) 98.1 F (36.7 C)  98.6 F (37 C)  TempSrc:      SpO2: 97% 96%  96%  Weight:      Height:        Intake/Output Summary (Last 24 hours) at 11/09/2022 1624 Last data filed at 11/09/2022 1315 Gross per 24 hour  Intake --  Output 4200 ml  Net -4200 ml    Filed Weights   11/06/22 1535  Weight: 93 kg    Data Reviewed: I have personally reviewed and interpreted daily labs, tele strips, imagings as discussed above. I reviewed all nursing notes, pharmacy notes, vitals, pertinent old records I have discussed plan of care as described above with RN and patient/family.  CBC: Recent Labs  Lab 11/06/22 1423 11/07/22 0445 11/08/22 0515 11/09/22 0416  WBC 19.9* 18.6* 17.2* 13.8*  NEUTROABS 17.3* 16.4*  --   --   HGB 11.2* 9.7* 9.8* 9.3*  HCT 33.3* 28.5* 28.9* 27.3*  MCV 91.5 90.5 90.0 88.3  PLT 402* 353 334 893*   Basic Metabolic Panel: Recent Labs  Lab 11/07/22 0445 11/07/22 2022 11/08/22 0515 11/08/22 2211 11/09/22 0416  NA 123* 125* 125* 124* 127*  K 3.8 4.2 3.8 3.8 3.4*  CL 91* 93* 93* 92* 93*  CO2 '23 24 24 22 24  '$ GLUCOSE 121* 165* 114* 106* 98  BUN '16 19 21 18 17  '$ CREATININE 0.54* 0.61 0.67  0.60* 0.55*  CALCIUM 8.1* 7.9* 8.1* 8.0* 8.0*  MG  --   --  2.0  --  1.8  PHOS  --   --  3.2  --  3.8    Studies: No results found.  Scheduled Meds:  amLODipine  10 mg Oral QHS   enoxaparin (LOVENOX) injection  40 mg Subcutaneous QHS   finasteride  5 mg Oral QHS   folic acid  1 mg Oral Daily   furosemide  20 mg Intravenous Q12H   gabapentin  100 mg Oral QHS   insulin aspart  0-15 Units Subcutaneous TID WC   irbesartan  300 mg Oral Daily   mycophenolate  1,000 mg Oral BID   pravastatin  20 mg Oral q1800   sodium chloride  1 g Oral TID WC   tamsulosin  0.4 mg Oral QPC breakfast   Vitamin D (Ergocalciferol)  50,000 Units Oral Q7 days   Continuous Infusions: PRN Meds: acetaminophen **OR** acetaminophen, HYDROcodone-acetaminophen, ondansetron **OR** ondansetron (ZOFRAN) IV, polyethylene glycol  Time spent: 50 minutes  Author: Val Riles. MD [facility] 11/09/2022  4:24 PM  To reach On-call, see care teams to locate the attending and reach out to them via [email or web address]. If 7PM-7AM, please contact night-coverage If you still have difficulty reaching the attending provider, please page the [facility] (Director on Call) for [facility] on amion for assistance.

## 2022-11-09 NOTE — Progress Notes (Signed)
Csw spoke with patient's son, Dominica Severin, to discuss discharge planning.  Family is amenable to SNF and education on the SNF process provided by CSW.  CSW will follow-up with family once bed offers are provided.

## 2022-11-09 NOTE — Progress Notes (Signed)
Occupational Therapy Treatment Patient Details Name: BALTASAR TWILLEY MRN: [id number] DOB: June 24, 1926 Today's Date: 11/09/2022   History of present illness presented to ER secondary to fall and progressive weakness; admitted for management of generalized weakness (likely multifactorial from prolonged down-time after fall, dehydration, hyponatremia).  Also noted with cholelithiasis and possible cholecystitis (work up ongoing).  Also noted with anterior rib fractures to bilat 7th ribs (non-displaced) and bilat pleural effusion (R > L)   OT comments  Chart reviewed, pt greeted in bed agreeable to OT tx session. Pt is alert and oriented x4, good one step direction following.Tx session targeted improving functional activity tolerance in the setting of ADL tasks. Improvements noted in STS requiring CGA with RW multiple attempts, short amb transfers to bedside commode and to chair with CGA with RW. MAX A required for toileting after BM. Pt is making progress towards goals, discharge recommendation remains appropriate. OT will continue to follow acutely.    Recommendations for follow up therapy are one component of a multi-disciplinary discharge planning process, led by the attending physician.  Recommendations may be updated based on patient status, additional functional criteria and insurance authorization.    Follow Up Recommendations  Skilled nursing-short term rehab (<3 hours/day)     Assistance Recommended at Discharge Frequent or constant Supervision/Assistance  Patient can return home with the following  A lot of help with walking and/or transfers;A lot of help with bathing/dressing/bathroom   Equipment Recommendations  Other (comment) (per next venue of care)    Recommendations for Other Services      Precautions / Restrictions Precautions Precautions: Fall Restrictions Weight Bearing Restrictions: No       Mobility Bed Mobility Overal bed mobility: Needs Assistance Bed  Mobility: Supine to Sit     Supine to sit: Mod assist     General bed mobility comments: verbal cues for body mechanics    Transfers Overall transfer level: Needs assistance Equipment used: Rolling walker (2 wheels) Transfers: Sit to/from Stand Sit to Stand: Min assist, Mod assist (multiple attempts from bsc)                 Balance Overall balance assessment: Needs assistance Sitting-balance support: No upper extremity supported, Feet supported Sitting balance-Leahy Scale: Good     Standing balance support: Bilateral upper extremity supported Standing balance-Leahy Scale: Fair                             ADL either performed or assessed with clinical judgement   ADL Overall ADL's : Needs assistance/impaired Eating/Feeding: Set up;Sitting   Grooming: Wash/dry hands;Sitting;Set up           Upper Body Dressing : Sitting   Lower Body Dressing: Maximal assistance Lower Body Dressing Details (indicate cue type and reason): socks Toilet Transfer: Minimal assistance;Rolling walker (2 wheels);BSC/3in1 Toilet Transfer Details (indicate cue type and reason): step pivot from bed>bsc>chair Toileting- Clothing Manipulation and Hygiene: Maximal assistance;Sit to/from stand Toileting - Clothing Manipulation Details (indicate cue type and reason): after BM on bsc     Functional mobility during ADLs: Min guard;Minimal assistance;Rolling walker (2 wheels) (short amb transfers, pt unable to tolerate further mobility)      Extremity/Trunk Assessment              Vision       Perception     Praxis      Cognition Arousal/Alertness: Awake/alert Behavior During Therapy: WFL for tasks assessed/performed Overall  Cognitive Status: Within Functional Limits for tasks assessed                                          Exercises      Shoulder Instructions       General Comments spo2 >90% on RA, RN notified    Pertinent Vitals/  Pain       Pain Assessment Pain Assessment: 0-10 Pain Score: 4  Pain Location: ribs Pain Descriptors / Indicators: Grimacing, Guarding Pain Intervention(s): Limited activity within patient's tolerance, Monitored during session, Repositioned  Home Living                                          Prior Functioning/Environment              Frequency  Min 2X/week        Progress Toward Goals  OT Goals(current goals can now be found in the care plan section)  Progress towards OT goals: Progressing toward goals     Plan Discharge plan remains appropriate    Co-evaluation                 AM-PAC OT "6 Clicks" Daily Activity     Outcome Measure   Help from another person eating meals?: None Help from another person taking care of personal grooming?: A Little Help from another person toileting, which includes using toliet, bedpan, or urinal?: A Lot Help from another person bathing (including washing, rinsing, drying)?: A Lot Help from another person to put on and taking off regular upper body clothing?: A Little Help from another person to put on and taking off regular lower body clothing?: A Lot 6 Click Score: 16    End of Session Equipment Utilized During Treatment: Gait belt;Rolling walker (2 wheels);Oxygen  OT Visit Diagnosis: Unsteadiness on feet (R26.81);Muscle weakness (generalized) (M62.81)   Activity Tolerance Patient tolerated treatment well   Patient Left with chair alarm set;in chair;with call bell/phone within reach   Nurse Communication Mobility status        Time: 9833-8250 OT Time Calculation (min): 36 min  Charges: OT General Charges $OT Visit: 1 Visit OT Treatments $Self Care/Home Management : 8-22 mins $Therapeutic Activity: 8-22 mins  Shanon Payor, OTD OTR/L  11/09/22, 12:52 PM

## 2022-11-10 ENCOUNTER — Inpatient Hospital Stay: Payer: Medicare Other

## 2022-11-10 DIAGNOSIS — R531 Weakness: Secondary | ICD-10-CM | POA: Diagnosis not present

## 2022-11-10 LAB — BASIC METABOLIC PANEL
Anion gap: 9 (ref 5–15)
Anion gap: 10 (ref 5–15)
BUN: 16 mg/dL (ref 8–23)
BUN: 17 mg/dL (ref 8–23)
CO2: 26 mmol/L (ref 22–32)
CO2: 25 mmol/L (ref 22–32)
Calcium: 8.3 mg/dL — ABNORMAL LOW (ref 8.9–10.3)
Calcium: 8.3 mg/dL — ABNORMAL LOW (ref 8.9–10.3)
Chloride: 95 mmol/L — ABNORMAL LOW (ref 98–111)
Chloride: 93 mmol/L — ABNORMAL LOW (ref 98–111)
Creatinine, Ser: 0.48 mg/dL — ABNORMAL LOW (ref 0.61–1.24)
Creatinine, Ser: 0.7 mg/dL (ref 0.61–1.24)
GFR, Estimated: >60 mL/min (ref >60)
GFR, Estimated: >60 mL/min (ref >60)
Glucose, Bld: 112 mg/dL — ABNORMAL HIGH (ref 70–99)
Glucose, Bld: 198 mg/dL — ABNORMAL HIGH (ref 70–99)
Potassium: 3.8 mmol/L (ref 3.5–5.1)
Potassium: 4.4 mmol/L (ref 3.5–5.1)
Sodium: 130 mmol/L — ABNORMAL LOW (ref 135–145)
Sodium: 128 mmol/L — ABNORMAL LOW (ref 135–145)

## 2022-11-10 LAB — CBC
HCT: 29 % — ABNORMAL LOW (ref 39.0–52.0)
Hemoglobin: 9.8 g/dL — ABNORMAL LOW (ref 13.0–17.0)
MCH: 29.9 pg (ref 26.0–34.0)
MCHC: 33.8 g/dL (ref 30.0–36.0)
MCV: 88.4 fL (ref 80.0–100.0)
Platelets: 475 10*3/uL — ABNORMAL HIGH (ref 150–400)
RBC: 3.28 MIL/uL — ABNORMAL LOW (ref 4.22–5.81)
RDW: 13.1 % (ref 11.5–15.5)
WBC: 9.2 10*3/uL (ref 4.0–10.5)
nRBC: 0 % (ref 0.0–0.2)

## 2022-11-10 LAB — GLUCOSE, CAPILLARY
Glucose-Capillary: 143 mg/dL — ABNORMAL HIGH (ref 70–99)
Glucose-Capillary: 131 mg/dL — ABNORMAL HIGH (ref 70–99)
Glucose-Capillary: 178 mg/dL — ABNORMAL HIGH (ref 70–99)
Glucose-Capillary: 157 mg/dL — ABNORMAL HIGH (ref 70–99)

## 2022-11-10 LAB — HEPATIC FUNCTION PANEL
ALT: 32 U/L (ref 0–44)
AST: 41 U/L (ref 15–41)
Albumin: 2.4 g/dL — ABNORMAL LOW (ref 3.5–5.0)
Alkaline Phosphatase: 57 U/L (ref 38–126)
Bilirubin, Direct: 0.1 mg/dL (ref 0.0–0.2)
Indirect Bilirubin: 1 mg/dL — ABNORMAL HIGH (ref 0.3–0.9)
Total Bilirubin: 1.1 mg/dL (ref 0.3–1.2)
Total Protein: 6.1 g/dL — ABNORMAL LOW (ref 6.5–8.1)

## 2022-11-10 LAB — MAGNESIUM: Magnesium: 1.8 mg/dL (ref 1.7–2.4)

## 2022-11-10 LAB — PHOSPHORUS: Phosphorus: 3.1 mg/dL (ref 2.5–4.6)

## 2022-11-10 MED ORDER — FUROSEMIDE 10 MG/ML IJ SOLN
20.0000 mg | Freq: Two times a day (BID) | INTRAMUSCULAR | Status: DC
  Administered 2022-11-10: 20 mg via INTRAVENOUS
  Filled 2022-11-10: qty 2

## 2022-11-10 MED ORDER — CHLORHEXIDINE GLUCONATE CLOTH 2 % EX PADS
6.0000 | MEDICATED_PAD | Freq: Every day | CUTANEOUS | Status: DC
  Administered 2022-11-10 – 2022-11-11 (×2): 6 via TOPICAL

## 2022-11-10 MED ORDER — FUROSEMIDE 10 MG/ML IJ SOLN
40.0000 mg | Freq: Two times a day (BID) | INTRAMUSCULAR | Status: DC
  Administered 2022-11-10: 40 mg via INTRAVENOUS
  Filled 2022-11-10: qty 4

## 2022-11-10 NOTE — Progress Notes (Signed)
[facility] Progress Note  Patient: [name]    JSE:[id number]  DOA: 11/06/2022     Date of Service: the patient was seen and examined on 11/10/2022  Chief Complaint  Patient presents with   Fall   Weakness   Brief hospital course: [name] is a 89 y.o. male with medical history significant of myasthenia gravis on CellCept, hypertension, TIA, hyperlipidemia, HFpEF, BPH, type 2 diabetes, chronic hyponatremia, who presents to the ED due to generalized weakness.  ED workup: Hyponatremia sodium 124, leukocytosis  CTA chest negative for PE, shows gallstone, general surgery was consulted, HIDA scan was ordered.  Patient received 1 dose of antibiotics.  Further management as below.   Assessment and Plan:  Generalized weakness, unknown cause could be due to hyponatremia CT head negative for any acute findings Continue fall precautions PT OT eval done, recommend SNF placement   Chronic hypotonic hyponatremia Sodium 124 on admission Na 124--123--125--127--130 Serum osmolality 269 low, and CK 278 wnl BNP 155--316 slightly elevated Started sodium chloride 1 g p.o. 3 times daily, continue fluid restriction 1/4 Lasix 20 mg IV twice daily x 2 days TTE shows LVEF 55 to 07%, grade 1 diastolic dysfunction. 1/6 increase Lasix 40 mg IV twice daily due to pleural effusion and pulmonary edema on x-ray   Leukocytosis, unknown cause, could be due to CellCept. Resolved  On CT imaging, there was noted to be a 2.4 cm gallstone in the gallbladder neck with surrounding edema and gallbladder wall thickening.  Suspected cholecystitis, noted, HIDA scan negative [place] abd shows tiny gallstone, no any other acute findings UA negative CXR There are no signs of pulmonary edema or focal pulmonary consolidation. Increased interstitial markings are seen in the lower lung fields, more so on the right side suggesting scarring or interstitial pneumonia. Negative COVID, RSV and influenza S/p  Zosyn one-time dose given in the ED Hold off antibiotics, trend WBC count General surgery was consulted, recommended no intervention and signed off. Procalcitonin level 0.57 at lower end 1/6 CXR There is increase in infiltrates in both lower lung fields suggesting worsening atelectasis/pneumonia. Possible small bilateral pleural effusions. 1/6 patient is saturating well on room air, denies any difficulty breathing, leukocytosis resolved, afebrile.  Continue Lasix as above.   Myasthenia gravis Patient states he has been in remission for years and does not believe he has ever had a flare. -Continue home CellCept negative inspiratory pressure -35 Continue to monitor for worsening of myasthenia gravis symptoms.    Rib fracture CT imaging notable for acute nondisplaced fractures of bilateral anterior seventh ribs.  Likely from recent fall although patient believes it may be from a viral illness that he had several weeks ago. - Pain control with Norco - Incentive spirometry 1/4 CXR: No definite acute infiltrate, pleural effusion, or pneumothorax. Chronic bronchitic changes with increased bibasilar atelectasis.   Diabetes mellitus - SSI, moderate - Hold home metformin  Folic acid level 6.1, at lower end, started folic acid 1 g p.o. daily. Vitamin B12 level 876, within normal range. Vitamin D insufficiency: started vitamin D 50,000 units p.o. weekly, follow with PCP to repeat vitamin D level after 3 to 6 months.   Urinary retention, PVR 292 ml as per bladder scan done by RN Foley catheter was inserted on 1/4 Continue Proscar and Flomax  Body mass index is 29.42 kg/m.   Interventions:       Diet: Carb modified DVT Prophylaxis: Subcutaneous Lovenox   Advance goals of care discussion: DNR  Family Communication: family was present at bedside, at the time of interview.  The pt provided permission to discuss medical plan with the family. Opportunity was given to ask question and all  questions were answered satisfactorily.   Disposition:  Pt is from Home, admitted with fall and generalized weakness, hyponatremia, still has low sodium, which precludes a safe discharge. Discharge to SNF, when clinically stable, may need 1-2 more days.  Subjective: No significant overnight events, patient denies any shortness of breath, no abdominal pain, no any other active issues.  Resting comfortably.   Physical Exam: General:  alert oriented to time, place, and person.  Appear in no distress, affect appropriate Eyes: PERRLA ENT: Oral Mucosa Clear, moist  Neck: no JVD,  Cardiovascular: S1 and S2 Present, no Murmur,  Respiratory: good respiratory effort, Bilateral Air entry equal and Decreased, mild bibasilar crackles, no wheezes Abdomen: Bowel Sound present, Soft and no tenderness,  Skin: no rashes Extremities: no Pedal edema, no calf tenderness Neurologic: without any new focal findings Gait not checked due to patient safety concerns  Vitals:   11/09/22 1743 11/09/22 2003 11/10/22 0517 11/10/22 0827  BP: 117/76 (!) 141/52 (!) 132/54 127/60  Pulse: 79 74 69 68  Resp:  (!) '21 18 16  '$ Temp:  97.9 F (36.6 C) 98.4 F (36.9 C) 98.8 F (37.1 C)  TempSrc:      SpO2:  95% 94% 97%  Weight:      Height:        Intake/Output Summary (Last 24 hours) at 11/10/2022 1355 Last data filed at 11/10/2022 1335 Gross per 24 hour  Intake 240 ml  Output 3750 ml  Net -3510 ml    Filed Weights   11/06/22 1535  Weight: 93 kg    Data Reviewed: I have personally reviewed and interpreted daily labs, tele strips, imagings as discussed above. I reviewed all nursing notes, pharmacy notes, vitals, pertinent old records I have discussed plan of care as described above with RN and patient/family.  CBC: Recent Labs  Lab 11/06/22 1423 11/07/22 0445 11/08/22 0515 11/09/22 0416 11/10/22 0525  WBC 19.9* 18.6* 17.2* 13.8* 9.2  NEUTROABS 17.3* 16.4*  --   --   --   HGB 11.2* 9.7* 9.8* 9.3*  9.8*  HCT 33.3* 28.5* 28.9* 27.3* 29.0*  MCV 91.5 90.5 90.0 88.3 88.4  PLT 402* 353 334 402* 253*   Basic Metabolic Panel: Recent Labs  Lab 11/08/22 0515 11/08/22 2211 11/09/22 0416 11/09/22 1700 11/10/22 0525  NA 125* 124* 127* 126* 130*  K 3.8 3.8 3.4* 4.5 3.8  CL 93* 92* 93* 93* 95*  CO2 '24 22 24 22 26  '$ GLUCOSE 114* 106* 98 118* 112*  BUN '21 18 17 15 16  '$ CREATININE 0.67 0.60* 0.55* 0.60* 0.48*  CALCIUM 8.1* 8.0* 8.0* 8.0* 8.3*  MG 2.0  --  1.8  --  1.8  PHOS 3.2  --  3.8  --  3.1    Studies: DG Chest Port 1 View  Result Date: 11/10/2022 CLINICAL DATA:  Shortness of breath EXAM: PORTABLE CHEST 1 VIEW COMPARISON:  11/08/2022 FINDINGS: There is poor inspiration. Transverse diameter of heart is increased. There is slight decrease in interstitial markings in parahilar regions. There are patchy densities in both lower lung fields with interval worsening. Lateral CP angles are indistinct. There is no pneumothorax. IMPRESSION: There is increase in infiltrates in both lower lung fields suggesting worsening atelectasis/pneumonia. Possible small bilateral pleural effusions. Electronically Signed   By: Royston Cowper  Rathinasamy M.D.   On: 11/10/2022 10:16    Scheduled Meds:  amLODipine  10 mg Oral QHS   enoxaparin (LOVENOX) injection  40 mg Subcutaneous QHS   finasteride  5 mg Oral QHS   folic acid  1 mg Oral Daily   furosemide  20 mg Intravenous Q12H   gabapentin  100 mg Oral QHS   insulin aspart  0-15 Units Subcutaneous TID WC   irbesartan  300 mg Oral Daily   mycophenolate  1,000 mg Oral BID   pravastatin  20 mg Oral q1800   sodium chloride  1 g Oral TID WC   tamsulosin  0.4 mg Oral QPC breakfast   Vitamin D (Ergocalciferol)  50,000 Units Oral Q7 days   Continuous Infusions: PRN Meds: acetaminophen **OR** acetaminophen, HYDROcodone-acetaminophen, ondansetron **OR** ondansetron (ZOFRAN) IV, polyethylene glycol  Time spent: 50 minutes  Author: Val Riles. MD [facility] 11/10/2022 1:55 PM  To reach On-call, see care teams to locate the attending and reach out to them via [email or web address]. If 7PM-7AM, please contact night-coverage If you still have difficulty reaching the attending provider, please page the [facility] (Director on Call) for [facility] on amion for assistance.

## 2022-11-10 NOTE — Progress Notes (Addendum)
NIF -24cm H2O. FVC 1.55L

## 2022-11-10 NOTE — TOC Progression Note (Addendum)
Transition of Care [facility]) - Progression Note    Patient Details  Name: [name] MRN: [id number] Date of Birth: Nov 13, 1925  Transition [facility] Care [facility] CM/SW Contact  [address] [place], [place] Phone Number: 11/10/2022, 8:56 AM  Clinical Narrative:     Follow up phone call to patient's son [name]. Bed offers provided, he will review and call this social worker back with a selection.  9:39am PASRR obtained-Fl2 updated [id number]  Dal Blew, LCSW Clinical Social Worker         Expected Discharge Plan and Services                                               Social Determinants of Health (SDOH) Interventions SDOH Screenings   Food Insecurity: No Food Insecurity (11/08/2022)  Housing: Low Risk  (11/08/2022)  Transportation Needs: No Transportation Needs (11/08/2022)  Utilities: Not At Risk (11/08/2022)  Depression (PHQ2-9): Low Risk  (10/03/2022)  Financial Resource Strain: Low Risk  (06/02/2019)  Physical Activity: Insufficiently Active (06/02/2019)  Social Connections: Unknown (06/02/2019)  Stress: No Stress Concern Present (06/02/2019)  Tobacco Use: Low Risk  (11/06/2022)    Readmission Risk Interventions     No data to display

## 2022-11-10 NOTE — TOC Progression Note (Signed)
Transition of Care [facility]) - Progression Note    Patient Details  Name: [name] MRN: [id number] Date of Birth: 17-May-1926  Transition of Care [facility]) CM/SW Contact  Shia Eber, [place], [place] Phone Number: 11/10/2022, 1:13 PM  Clinical Narrative:     Patient information faxed over to [facility] for SNF bed re-consideration .   Graden Hoshino, LCSW Clinical Social Worker  Transition of Care        Expected Discharge Plan and Services                                               Social Determinants of Health (SDOH) Interventions SDOH Screenings   Food Insecurity: No Food Insecurity (11/08/2022)  Housing: Low Risk  (11/08/2022)  Transportation Needs: No Transportation Needs (11/08/2022)  Utilities: Not At Risk (11/08/2022)  Depression (PHQ2-9): Low Risk  (10/03/2022)  Financial Resource Strain: Low Risk  (06/02/2019)  Physical Activity: Insufficiently Active (06/02/2019)  Social Connections: Unknown (06/02/2019)  Stress: No Stress Concern Present (06/02/2019)  Tobacco Use: Low Risk  (11/06/2022)    Readmission Risk Interventions     No data to display

## 2022-11-10 NOTE — NC FL2 (Signed)
[place] LEVEL OF CARE FORM     IDENTIFICATION  Patient Name: [name] Birthdate: 10-Oct-1926 Sex: male Admission Date (Current Location): 11/06/2022  [facility] and [place] Number:  Engineering geologist and Address:  [facility], [address] [place], [place] [address]      Provider Number:    Attending Physician Name and Address:  Val Riles, MD  Relative Name and Phone Number:  Margaretha Glassing [phone]    Current Level of Care: Hospital Recommended Level of Care: [place] Prior Approval Number:    Date Approved/Denied:   PASRR Number: [id number]  Discharge Plan: SNF    Current Diagnoses: Patient Active Problem List   Diagnosis Date Noted   Generalized weakness 11/06/2022   Leukocytosis 11/06/2022   Cholecystitis 11/06/2022   Rib fracture 11/06/2022   Foot drop 10/07/2022   Right-sided chest pain 05/27/2022   Hav (hallux abducto valgus), unspecified laterality 01/04/2022   Sleep difficulties 05/22/2021   Cerumen impaction 10/15/2020   Burning sensation of skin 01/31/2020   Cough 01/25/2020   Diabetic neuropathy ([place]) 09/10/2019   Carpal tunnel syndrome, right 05/22/2019   SOB (shortness of breath) 02/25/2018   Low back pain 10/03/2016   BPH (benign prostatic hyperplasia) 07/01/2016   Myasthenia gravis ([place]) 10/09/2015   Binocular vision disorder with diplopia 09/26/2015   Fatigue 09/26/2015   Upper extremity weakness 09/20/2015   TIA (transient ischemic attack) 09/13/2015   CVA (cerebral infarction) 09/13/2015   Cold feet 07/20/2015   Health care maintenance 01/25/2015   B12 deficiency 11/16/2014   Anemia 09/27/2014   BMI 34.0-34.9,adult 05/29/2014   Hyponatremia 09/20/2013   Diabetes mellitus ([place]) 12/06/2011   Hypercholesterolemia 08/15/2010   HYPERTENSION, BENIGN 08/15/2010   PALPITATIONS, OCCASIONAL 08/15/2010    Orientation RESPIRATION BLADDER Height & Weight     Self,  Time, Situation, Place (memory impairment)  Normal External catheter, Incontinent Weight: 205 lb 0.4 oz (93 kg) Height:  '5\' 10"'$  (177.8 cm)  BEHAVIORAL SYMPTOMS/MOOD NEUROLOGICAL BOWEL NUTRITION STATUS   (n/a)   Continent Diet  AMBULATORY STATUS COMMUNICATION OF NEEDS Skin   Limited Assist Verbally Normal                       Personal Care Assistance Level of Assistance  Bathing, Feeding, Dressing Bathing Assistance: Limited assistance Feeding assistance: Limited assistance Dressing Assistance: Limited assistance     Functional Limitations Info             SPECIAL CARE FACTORS FREQUENCY  PT (By licensed PT), OT (By licensed OT)     PT Frequency: 5x's/week OT Frequency: 5x's/week            Contractures Contractures Info: Not present    Additional Factors Info  Code Status, Allergies Code Status Info: DNR Allergies Info: Magnesium, Penicillamine           Current Medications (11/10/2022):  This is the current hospital active medication list Current Facility-Administered Medications  Medication Dose Route Frequency Provider Last Rate Last Admin   acetaminophen (TYLENOL) tablet 650 mg  650 mg Oral Q6H PRN Jose Persia, MD       Or   acetaminophen (TYLENOL) suppository 650 mg  650 mg Rectal Q6H PRN Jose Persia, MD       amLODipine (NORVASC) tablet 10 mg  10 mg Oral QHS Jose Persia, MD   10 mg at 11/09/22 2148   enoxaparin (LOVENOX) injection 40 mg  40 mg  Subcutaneous QHS Val Riles, MD   40 mg at 11/09/22 2147   finasteride (PROSCAR) tablet 5 mg  5 mg Oral QHS Jose Persia, MD   5 mg at 76/16/07 3710   folic acid (FOLVITE) tablet 1 mg  1 mg Oral Daily Val Riles, MD   1 mg at 11/10/22 6269   furosemide (LASIX) injection 20 mg  20 mg Intravenous Q12H Val Riles, MD       gabapentin (NEURONTIN) capsule 100 mg  100 mg Oral QHS Jose Persia, MD   100 mg at 11/09/22 2148   HYDROcodone-acetaminophen (NORCO/VICODIN) 5-325 MG per tablet 1  tablet  1 tablet Oral Q6H PRN Jose Persia, MD       insulin aspart (novoLOG) injection 0-15 Units  0-15 Units Subcutaneous TID WC Jose Persia, MD   2 Units at 11/10/22 0906   irbesartan (AVAPRO) tablet 300 mg  300 mg Oral Daily Jose Persia, MD   300 mg at 11/10/22 4854   mycophenolate (CELLCEPT) capsule 1,000 mg  1,000 mg Oral BID Jose Persia, MD   1,000 mg at 11/10/22 0905   ondansetron (ZOFRAN) tablet 4 mg  4 mg Oral Q6H PRN Jose Persia, MD       Or   ondansetron (ZOFRAN) injection 4 mg  4 mg Intravenous Q6H PRN Jose Persia, MD       polyethylene glycol (MIRALAX / GLYCOLAX) packet 17 g  17 g Oral Daily PRN Jose Persia, MD   17 g at 11/07/22 1830   pravastatin (PRAVACHOL) tablet 20 mg  20 mg Oral q1800 Jose Persia, MD   20 mg at 11/09/22 1703   sodium chloride tablet 1 g  1 g Oral TID WC Val Riles, MD   1 g at 11/10/22 0906   tamsulosin (FLOMAX) capsule 0.4 mg  0.4 mg Oral QPC breakfast Jose Persia, MD   0.4 mg at 11/10/22 6270   Vitamin D (Ergocalciferol) (DRISDOL) 1.25 MG (50000 UNIT) capsule 50,000 Units  50,000 Units Oral Q7 days Val Riles, MD   50,000 Units at 11/07/22 1927     Discharge Medications: Please see discharge summary for a list of discharge medications.  Relevant Imaging Results:  Relevant Lab Results:   Additional Information SS#: [id number]  Elliot Gurney [place], [place]

## 2022-11-10 NOTE — TOC Progression Note (Signed)
Transition of Care [facility]) - Progression Note    Patient Details  Name: [name] MRN: [id number] Date of Birth: [birth year]  [facility] CM/SW Contact  Jeramiah Mccaughey, Edith Endave, [place] Phone Number: 11/10/2022, 10:16 AM  Clinical Narrative:     Return phone call from patient's son with bed selection-[facility] selected.   Corayma Cashatt, LCSW Transition of Care  .       Expected Discharge Plan and Services                                               Social Determinants of Health (SDOH) Interventions SDOH Screenings   Food Insecurity: No Food Insecurity (11/08/2022)  Housing: Low Risk  (11/08/2022)  Transportation Needs: No Transportation Needs (11/08/2022)  Utilities: Not At Risk (11/08/2022)  Depression (PHQ2-9): Low Risk  (10/03/2022)  Financial Resource Strain: Low Risk  (06/02/2019)  Physical Activity: Insufficiently Active (06/02/2019)  Social Connections: Unknown (06/02/2019)  Stress: No Stress Concern Present (06/02/2019)  Tobacco Use: Low Risk  (11/06/2022)    Readmission Risk Interventions     No data to display

## 2022-11-10 NOTE — Progress Notes (Signed)
FVC and NIF done at this time. FVC 1.25L AND NIF -25CMH2O

## 2022-11-10 NOTE — Progress Notes (Signed)
Physical Therapy Treatment Patient Details Name: [name] MRN: [id number] DOB: 1926-09-30 Today's Date: 11/10/2022   History of Present Illness presented to ER secondary to fall and progressive weakness; admitted for management of generalized weakness (likely multifactorial from prolonged down-time after fall, dehydration, hyponatremia).  Also noted with cholelithiasis and possible cholecystitis (work up ongoing).  Also noted with anterior rib fractures to bilat 7th ribs (non-displaced) and bilat pleural effusion (R > L)    PT Comments    Pt was long sitting in bed, awake, and oriented. Supportive daughter in-law Investment banker, corporate)  at bedside. He agrees to session and remains cooperative throughout. Does have slower processing time but seems to have good understanding of situation and awareness of deficits. Also agrees to OOB activity and overall tolerated session well. Pt is severely deconditioned. He ambulated to BR earlier in the day with RN staff. This afternoon, pt was able to ambulate to doorway of bathroom but required seated rest due to fatigue. Chair follow recommended for all ambulation/gait. Pt requested not to ambulate again due to fatigue. Ambulation was performed with BLE dorsi assist AFO/shoes donned. Discussed need for rehab and both pt and family agreeable. Acute PT will continue to follow per current POC. Issued seated and bed level there ex with encouragement to continue to use spirometer.     Recommendations for follow up therapy are one component of a multi-disciplinary discharge planning process, led by the attending physician.  Recommendations may be updated based on patient status, additional functional criteria and insurance authorization.  Follow Up Recommendations  Skilled nursing-short term rehab (<3 hours/day)     Assistance Recommended at Discharge Frequent or constant Supervision/Assistance  Patient can return home with the following A little help with walking and/or  transfers;A little help with bathing/dressing/bathroom;Assistance with cooking/housework;Direct supervision/assist for medications management;Direct supervision/assist for financial management;Assist for transportation;Help with stairs or ramp for entrance   Equipment Recommendations  Other (comment) (defer to next level of care)       Precautions / Restrictions Precautions Precautions: Fall Restrictions Weight Bearing Restrictions: No     Mobility  Bed Mobility Overal bed mobility: Needs Assistance Bed Mobility: Supine to Sit  Supine to sit: Min assist, Mod assist  General bed mobility comments: min-mod assist of one with increaed time + vcs for improved technique. min assist to roll but mod assist to safely achieve EOB short sit fro side lying    Transfers Overall transfer level: Needs assistance Equipment used: Rolling walker (2 wheels) Transfers: Sit to/from Stand Sit to Stand: Min guard, Min assist, From elevated surface   General transfer comment: CGA to stand from elevated bed height. min assist form lower bed height settings. fair eccentric control with stand> sit    Ambulation/Gait Ambulation/Gait assistance: Min assist Gait Distance (Feet): 12 Feet Assistive device: Rolling walker (2 wheels) Gait Pattern/deviations: Step-through pattern, Trunk flexed Gait velocity: decreased.     General Gait Details: Author applied BLE dorsi assist AFO braces prior to standing and ambulating. pt was able to ambulate ~ 12 ft but is severely limited by fatigued. Pt ambulated to BR/toiletearlier in the day with RN. Pt requested not to ambulate again after one trial.      Balance Overall balance assessment: Needs assistance Sitting-balance support: No upper extremity supported, Feet supported Sitting balance-Leahy Scale: Good     Standing balance support: Bilateral upper extremity supported Standing balance-Leahy Scale: Fair         Cognition Arousal/Alertness:  Awake/alert Behavior During Therapy: [facility]  for tasks assessed/performed Overall Cognitive Status: Within Functional Limits for tasks assessed      General Comments: increased time to process and respond but overall oriented to situation with fair insight of past medical history. Agreeable to SNF at DC. Fatigued quickly           General Comments General comments (skin integrity, edema, etc.): Lengthy discussion with pt/pt's daughter in-law about positioning and increased activity to improve tolerance.      Pertinent Vitals/Pain Pain Assessment Pain Assessment: No/denies pain Pain Score: 0-No pain     PT Goals (current goals can now be found in the care plan section) Acute Rehab PT Goals Patient Stated Goal: to get stronger Progress towards PT goals: Progressing toward goals    Frequency    Min 2X/week      PT Plan Current plan remains appropriate    Co-evaluation     PT goals addressed during session: Mobility/safety with mobility;Balance;Proper use of DME;Strengthening/ROM        AM-PAC PT "6 Clicks" Mobility   Outcome Measure  Help needed turning from your back to your side while in a flat bed without using bedrails?: A Little           6 Click Score: 3    End of Session   Activity Tolerance: Patient tolerated treatment well Patient left: in chair;with call bell/phone within reach;with family/visitor present Nurse Communication: Mobility status PT Visit Diagnosis: Muscle weakness (generalized) (M62.81);Difficulty in walking, not elsewhere classified (R26.2);History of falling (Z91.81)     Time: 9166-0600 PT Time Calculation (min) (ACUTE ONLY): 27 min  Charges:  $Gait Training: 8-22 mins $Therapeutic Activity: 8-22 mins                     Julaine Fusi PTA 11/10/22, 2:56 PM

## 2022-11-11 DIAGNOSIS — R531 Weakness: Secondary | ICD-10-CM | POA: Diagnosis not present

## 2022-11-11 LAB — CULTURE, BLOOD (ROUTINE X 2)
Culture: NO GROWTH
Culture: NO GROWTH
Special Requests: ADEQUATE
Special Requests: ADEQUATE

## 2022-11-11 LAB — CBC
HCT: 29.5 % — ABNORMAL LOW (ref 39.0–52.0)
Hemoglobin: 9.9 g/dL — ABNORMAL LOW (ref 13.0–17.0)
MCH: 29.6 pg (ref 26.0–34.0)
MCHC: 33.6 g/dL (ref 30.0–36.0)
MCV: 88.3 fL (ref 80.0–100.0)
Platelets: 495 10*3/uL — ABNORMAL HIGH (ref 150–400)
RBC: 3.34 MIL/uL — ABNORMAL LOW (ref 4.22–5.81)
RDW: 13.1 % (ref 11.5–15.5)
WBC: 8.1 10*3/uL (ref 4.0–10.5)
nRBC: 0 % (ref 0.0–0.2)

## 2022-11-11 LAB — BASIC METABOLIC PANEL
Anion gap: 10 (ref 5–15)
Anion gap: 9 (ref 5–15)
BUN: 16 mg/dL (ref 8–23)
BUN: 18 mg/dL (ref 8–23)
CO2: 27 mmol/L (ref 22–32)
CO2: 26 mmol/L (ref 22–32)
Calcium: 8.1 mg/dL — ABNORMAL LOW (ref 8.9–10.3)
Calcium: 8.2 mg/dL — ABNORMAL LOW (ref 8.9–10.3)
Chloride: 95 mmol/L — ABNORMAL LOW (ref 98–111)
Chloride: 93 mmol/L — ABNORMAL LOW (ref 98–111)
Creatinine, Ser: 0.59 mg/dL — ABNORMAL LOW (ref 0.61–1.24)
Creatinine, Ser: 0.62 mg/dL (ref 0.61–1.24)
GFR, Estimated: >60 mL/min (ref >60)
GFR, Estimated: >60 mL/min (ref >60)
Glucose, Bld: 123 mg/dL — ABNORMAL HIGH (ref 70–99)
Glucose, Bld: 139 mg/dL — ABNORMAL HIGH (ref 70–99)
Potassium: 3.4 mmol/L — ABNORMAL LOW (ref 3.5–5.1)
Potassium: 3.9 mmol/L (ref 3.5–5.1)
Sodium: 132 mmol/L — ABNORMAL LOW (ref 135–145)
Sodium: 128 mmol/L — ABNORMAL LOW (ref 135–145)

## 2022-11-11 LAB — HEPATIC FUNCTION PANEL
ALT: 27 U/L (ref 0–44)
AST: 30 U/L (ref 15–41)
Albumin: 2.3 g/dL — ABNORMAL LOW (ref 3.5–5.0)
Alkaline Phosphatase: 55 U/L (ref 38–126)
Bilirubin, Direct: 0.2 mg/dL (ref 0.0–0.2)
Indirect Bilirubin: 0.7 mg/dL (ref 0.3–0.9)
Total Bilirubin: 0.9 mg/dL (ref 0.3–1.2)
Total Protein: 6.1 g/dL — ABNORMAL LOW (ref 6.5–8.1)

## 2022-11-11 LAB — PHOSPHORUS: Phosphorus: 3.6 mg/dL (ref 2.5–4.6)

## 2022-11-11 LAB — MAGNESIUM: Magnesium: 1.7 mg/dL (ref 1.7–2.4)

## 2022-11-11 LAB — GLUCOSE, CAPILLARY
Glucose-Capillary: 126 mg/dL — ABNORMAL HIGH (ref 70–99)
Glucose-Capillary: 140 mg/dL — ABNORMAL HIGH (ref 70–99)
Glucose-Capillary: 147 mg/dL — ABNORMAL HIGH (ref 70–99)
Glucose-Capillary: 119 mg/dL — ABNORMAL HIGH (ref 70–99)

## 2022-11-11 MED ORDER — FUROSEMIDE 10 MG/ML IJ SOLN
40.0000 mg | Freq: Two times a day (BID) | INTRAMUSCULAR | Status: DC
  Administered 2022-11-11 (×2): 40 mg via INTRAVENOUS
  Filled 2022-11-11 (×2): qty 4

## 2022-11-11 MED ORDER — POTASSIUM CHLORIDE CRYS ER 20 MEQ PO TBCR
40.0000 meq | EXTENDED_RELEASE_TABLET | Freq: Once | ORAL | Status: AC
  Administered 2022-11-11: 40 meq via ORAL
  Filled 2022-11-11: qty 2

## 2022-11-11 MED ORDER — GUAIFENESIN-DM 100-10 MG/5ML PO SYRP
5.0000 mL | ORAL_SOLUTION | ORAL | Status: DC | PRN
  Administered 2022-11-11: 5 mL via ORAL
  Filled 2022-11-11: qty 10

## 2022-11-11 NOTE — Progress Notes (Signed)
Pt achieved a VC of 1.28 and NIF of -38 with good effort sitting high-fowlers in bed.

## 2022-11-11 NOTE — Progress Notes (Signed)
Completed the NIF and FVC this morning. Patient gave great effort. NIF -30cm H2O. FVC 1.38L.

## 2022-11-11 NOTE — Progress Notes (Signed)
[facility] Progress Note  Patient: [name]    XFG:[id number]  DOA: 11/06/2022     Date of Service: the patient was seen and examined on 11/11/2022  Chief Complaint  Patient presents with   Fall   Weakness   Brief hospital course: [name] is a 89 y.o. male with medical history significant of myasthenia gravis on CellCept, hypertension, TIA, hyperlipidemia, HFpEF, BPH, type 2 diabetes, chronic hyponatremia, who presents to the ED due to generalized weakness.  ED workup: Hyponatremia sodium 124, leukocytosis  CTA chest negative for PE, shows gallstone, general surgery was consulted, HIDA scan was ordered.  Patient received 1 dose of antibiotics.  Further management as below.   Assessment and Plan:  Generalized weakness, unknown cause could be due to hyponatremia CT head negative for any acute findings Continue fall precautions PT OT eval done, recommend SNF placement   Chronic hypotonic hyponatremia Sodium 124 on admission Na 124--123--125--127--130--132 Serum osmolality 269 low, and CK 278 wnl BNP 155--316 slightly elevated Started sodium chloride 1 g p.o. 3 times daily, continue fluid restriction 1/4 Lasix 20 mg IV twice daily x 2 days TTE shows LVEF 55 to 96%, grade 1 diastolic dysfunction. 1/6 increase Lasix 40 mg IV twice daily due to pleural effusion and pulmonary edema on x-ray   Leukocytosis, unknown cause, could be due to CellCept. Resolved  On CT imaging, there was noted to be a 2.4 cm gallstone in the gallbladder neck with surrounding edema and gallbladder wall thickening.  Suspected cholecystitis, noted, HIDA scan negative [place] abd shows tiny gallstone, no any other acute findings UA negative CXR There are no signs of pulmonary edema or focal pulmonary consolidation. Increased interstitial markings are seen in the lower lung fields, more so on the right side suggesting scarring or interstitial pneumonia. Negative COVID, RSV and  influenza S/p Zosyn one-time dose given in the ED Hold off antibiotics, trend WBC count General surgery was consulted, recommended no intervention and signed off. Procalcitonin level 0.57 at lower end 1/6 CXR There is increase in infiltrates in both lower lung fields suggesting worsening atelectasis/pneumonia. Possible small bilateral pleural effusions. 1/6 patient is saturating well on room air, denies any difficulty breathing, leukocytosis resolved, afebrile.  Continue Lasix as above.   Myasthenia gravis Patient states he has been in remission for years and does not believe he has ever had a flare. -Continue home CellCept negative inspiratory pressure -35 Continue to monitor for worsening of myasthenia gravis symptoms.    Rib fracture CT imaging notable for acute nondisplaced fractures of bilateral anterior seventh ribs.  Likely from recent fall although patient believes it may be from a viral illness that he had several weeks ago. - Pain control with Norco - Incentive spirometry 1/4 CXR: No definite acute infiltrate, pleural effusion, or pneumothorax. Chronic bronchitic changes with increased bibasilar atelectasis.   Diabetes mellitus - SSI, moderate - Hold home metformin  Folic acid level 6.1, at lower end, started folic acid 1 g p.o. daily. Vitamin B12 level 876, within normal range. Vitamin D insufficiency: started vitamin D 50,000 units p.o. weekly, follow with PCP to repeat vitamin D level after 3 to 6 months.   Urinary retention, PVR 292 ml as per bladder scan done by RN Foley catheter was inserted on 1/4 Continue Proscar and Flomax DC Foley tomorrow a.m. and follow voiding trial  Body mass index is 29.42 kg/m.   Interventions:       Diet: Carb modified DVT Prophylaxis: Subcutaneous Lovenox  Advance goals of care discussion: DNR  Family Communication: family was present at bedside, at the time of interview.  The pt provided permission to discuss medical plan  with the family. Opportunity was given to ask question and all questions were answered satisfactorily.   Disposition:  Pt is from Home, admitted with fall and generalized weakness, hyponatremia, still has low sodium, which precludes a safe discharge. Discharge to SNF, when clinically stable, may need 1 more day.  Subjective: No significant overnight events, patient denies any shortness of breath, no abdominal pain, no any other active issues.  Resting comfortably.   Physical Exam: General:  alert oriented to time, place, and person.  Appear in no distress, affect appropriate Eyes: PERRLA ENT: Oral Mucosa Clear, moist  Neck: no JVD,  Cardiovascular: S1 and S2 Present, no Murmur,  Respiratory: good respiratory effort, Bilateral Air entry equal and Decreased, mild bibasilar crackles, no wheezes Abdomen: Bowel Sound present, Soft and no tenderness,  Skin: no rashes Extremities: no Pedal edema, no calf tenderness Neurologic: without any new focal findings Gait not checked due to patient safety concerns  Vitals:   11/10/22 1630 11/10/22 2046 11/11/22 0533 11/11/22 0834  BP: 131/66 (!) 129/57 (!) 139/49 (!) 128/58  Pulse: 75 67 64 64  Resp: '16  18 16  '$ Temp: 98 F (36.7 C) 98.5 F (36.9 C) 97.7 F (36.5 C) 97.6 F (36.4 C)  TempSrc:  Oral Oral   SpO2: 100% 100% 98% 98%  Weight:      Height:        Intake/Output Summary (Last 24 hours) at 11/11/2022 1122 Last data filed at 11/10/2022 1858 Gross per 24 hour  Intake 240 ml  Output 1750 ml  Net -1510 ml    Filed Weights   11/06/22 1535  Weight: 93 kg    Data Reviewed: I have personally reviewed and interpreted daily labs, tele strips, imagings as discussed above. I reviewed all nursing notes, pharmacy notes, vitals, pertinent old records I have discussed plan of care as described above with RN and patient/family.  CBC: Recent Labs  Lab 11/06/22 1423 11/07/22 0445 11/08/22 0515 11/09/22 0416 11/10/22 0525 11/11/22 0416   WBC 19.9* 18.6* 17.2* 13.8* 9.2 8.1  NEUTROABS 17.3* 16.4*  --   --   --   --   HGB 11.2* 9.7* 9.8* 9.3* 9.8* 9.9*  HCT 33.3* 28.5* 28.9* 27.3* 29.0* 29.5*  MCV 91.5 90.5 90.0 88.3 88.4 88.3  PLT 402* 353 334 402* 475* 734*   Basic Metabolic Panel: Recent Labs  Lab 11/08/22 0515 11/08/22 2211 11/09/22 0416 11/09/22 1700 11/10/22 0525 11/10/22 1656 11/11/22 0416  NA 125*   < > 127* 126* 130* 128* 132*  K 3.8   < > 3.4* 4.5 3.8 4.4 3.4*  CL 93*   < > 93* 93* 95* 93* 95*  CO2 24   < > '24 22 26 25 27  '$ GLUCOSE 114*   < > 98 118* 112* 198* 123*  BUN 21   < > '17 15 16 17 16  '$ CREATININE 0.67   < > 0.55* 0.60* 0.48* 0.70 0.59*  CALCIUM 8.1*   < > 8.0* 8.0* 8.3* 8.3* 8.1*  MG 2.0  --  1.8  --  1.8  --  1.7  PHOS 3.2  --  3.8  --  3.1  --  3.6   < > = values in this interval not displayed.    Studies: No results found.  Scheduled Meds:  amLODipine  10 mg Oral QHS   Chlorhexidine Gluconate Cloth  6 each Topical Daily   enoxaparin (LOVENOX) injection  40 mg Subcutaneous QHS   finasteride  5 mg Oral QHS   folic acid  1 mg Oral Daily   furosemide  40 mg Intravenous Q12H   gabapentin  100 mg Oral QHS   insulin aspart  0-15 Units Subcutaneous TID WC   irbesartan  300 mg Oral Daily   mycophenolate  1,000 mg Oral BID   pravastatin  20 mg Oral q1800   sodium chloride  1 g Oral TID WC   tamsulosin  0.4 mg Oral QPC breakfast   Vitamin D (Ergocalciferol)  50,000 Units Oral Q7 days   Continuous Infusions: PRN Meds: acetaminophen **OR** acetaminophen, guaiFENesin-dextromethorphan, HYDROcodone-acetaminophen, ondansetron **OR** ondansetron (ZOFRAN) IV, polyethylene glycol  Time spent: 40 minutes  Author: Val Riles. MD [facility] 11/11/2022 11:22 AM  To reach On-call, see care teams to locate the attending and reach out to them via [email or web address]. If 7PM-7AM, please contact night-coverage If you still have difficulty reaching the attending provider, please page the [facility]  (Director on Call) for [facility] on amion for assistance.

## 2022-11-12 DIAGNOSIS — R531 Weakness: Secondary | ICD-10-CM | POA: Diagnosis not present

## 2022-11-12 LAB — CBC
HCT: 31 % — ABNORMAL LOW (ref 39.0–52.0)
Hemoglobin: 10.4 g/dL — ABNORMAL LOW (ref 13.0–17.0)
MCH: 30.2 pg (ref 26.0–34.0)
MCHC: 33.5 g/dL (ref 30.0–36.0)
MCV: 90.1 fL (ref 80.0–100.0)
Platelets: 482 10*3/uL — ABNORMAL HIGH (ref 150–400)
RBC: 3.44 MIL/uL — ABNORMAL LOW (ref 4.22–5.81)
RDW: 13.2 % (ref 11.5–15.5)
WBC: 9.3 10*3/uL (ref 4.0–10.5)
nRBC: 0 % (ref 0.0–0.2)

## 2022-11-12 LAB — BASIC METABOLIC PANEL
Anion gap: 10 (ref 5–15)
Anion gap: 9 (ref 5–15)
BUN: 16 mg/dL (ref 8–23)
BUN: 21 mg/dL (ref 8–23)
CO2: 26 mmol/L (ref 22–32)
CO2: 24 mmol/L (ref 22–32)
Calcium: 8.2 mg/dL — ABNORMAL LOW (ref 8.9–10.3)
Calcium: 8.2 mg/dL — ABNORMAL LOW (ref 8.9–10.3)
Chloride: 94 mmol/L — ABNORMAL LOW (ref 98–111)
Chloride: 94 mmol/L — ABNORMAL LOW (ref 98–111)
Creatinine, Ser: 0.61 mg/dL (ref 0.61–1.24)
Creatinine, Ser: 0.69 mg/dL (ref 0.61–1.24)
GFR, Estimated: >60 mL/min (ref >60)
GFR, Estimated: >60 mL/min (ref >60)
Glucose, Bld: 116 mg/dL — ABNORMAL HIGH (ref 70–99)
Glucose, Bld: 172 mg/dL — ABNORMAL HIGH (ref 70–99)
Potassium: 3.6 mmol/L (ref 3.5–5.1)
Potassium: 3.8 mmol/L (ref 3.5–5.1)
Sodium: 130 mmol/L — ABNORMAL LOW (ref 135–145)
Sodium: 127 mmol/L — ABNORMAL LOW (ref 135–145)

## 2022-11-12 LAB — GLUCOSE, CAPILLARY
Glucose-Capillary: 127 mg/dL — ABNORMAL HIGH (ref 70–99)
Glucose-Capillary: 156 mg/dL — ABNORMAL HIGH (ref 70–99)
Glucose-Capillary: 175 mg/dL — ABNORMAL HIGH (ref 70–99)
Glucose-Capillary: 126 mg/dL — ABNORMAL HIGH (ref 70–99)

## 2022-11-12 MED ORDER — FUROSEMIDE 10 MG/ML IJ SOLN
40.0000 mg | Freq: Two times a day (BID) | INTRAMUSCULAR | Status: DC
  Administered 2022-11-12 (×2): 40 mg via INTRAVENOUS
  Filled 2022-11-12 (×2): qty 4

## 2022-11-12 MED ORDER — SODIUM CHLORIDE 1 G PO TABS
1.0000 g | ORAL_TABLET | Freq: Three times a day (TID) | ORAL | Status: DC
  Administered 2022-11-12 (×3): 1 g via ORAL
  Filled 2022-11-12 (×2): qty 1

## 2022-11-12 NOTE — Progress Notes (Signed)
Physical Therapy Treatment Patient Details Name: [name] MRN: [id number] DOB: 1926/04/01 Today's Date: 11/12/2022   History of Present Illness presented to ER secondary to fall and progressive weakness; admitted for management of generalized weakness (likely multifactorial from prolonged down-time after fall, dehydration, hyponatremia).  Also noted with cholelithiasis and possible cholecystitis (work up ongoing).  Also noted with anterior rib fractures to bilat 7th ribs (non-displaced) and bilat pleural effusion (R > L)    PT Comments    Pt received upright on BSC in care of NT. Pt introduced to Chief Strategy Officer and plan for today's session. Pt agreeable. Assisted NT in hygiene care with minguard to stand at [facility] with RW requiring assist in donning ankle DF assist shoe wear prior to standing. Limited hip/knee extension requiring min VC's for positioning tolerating a good 2-3 min in standing for UB bathing by NT. Pt then assisted with step pivot to bedside and RW with safe use of RW. Limited foot clearance with steps despite shoe wear. In sitting pt educated on importance of hip flexor strengthening to assist in improved foot clearance with gait and transfers to reduce falls risk. NT assisting in chair set up and chair follow with pt standing minA from lowered EOB and ambulating to recliner ~ 12'.  Pt reports fatigue in sitting with LE's elevating and all needs in reach. Pt progressing in tolerance for OOB activity as evidenced by standing and gait tasks. Will continue to benefit from OOB mobility to improve tolerance for functional mobility and improved independence with ADL's. D/c recs remain appropriate.      Recommendations for follow up therapy are one component of a multi-disciplinary discharge planning process, led by the attending physician.  Recommendations may be updated based on patient status, additional functional criteria and insurance authorization.  Follow Up Recommendations  Skilled  nursing-short term rehab (<3 hours/day) Can patient physically be transported by private vehicle: No   Assistance Recommended at Discharge Frequent or constant Supervision/Assistance  Patient can return home with the following A little help with walking and/or transfers;A little help with bathing/dressing/bathroom;Assistance with cooking/housework;Direct supervision/assist for medications management;Direct supervision/assist for financial management;Assist for transportation;Help with stairs or ramp for entrance   Equipment Recommendations  Other (comment) (TBD by next venue of care)    Recommendations for Other Services       Precautions / Restrictions Precautions Precautions: Fall Restrictions Weight Bearing Restrictions: No     Mobility  Bed Mobility               General bed mobility comments: NT. From BSC to recliner in session. Patient Response: Cooperative  Transfers Overall transfer level: Needs assistance Equipment used: Rolling walker (2 wheels) Transfers: Sit to/from Stand Sit to Stand: Min guard, Min assist           General transfer comment: Minguard to stand from [facility] with arm rest use. MinA+1 needed from EOB due to no UE support.    Ambulation/Gait Ambulation/Gait assistance: Min guard Gait Distance (Feet): 12 Feet Assistive device: Rolling walker (2 wheels) Gait Pattern/deviations: Step-through pattern, Trunk flexed       General Gait Details: Pt assisted in donning ankle DF assist shoewear at [facility]. Still remains in limited tolerance for OOB mobility due to fatigue.   Stairs             Wheelchair Mobility    Modified Rankin (Stroke Patients Only)       Balance Overall balance assessment: Needs assistance Sitting-balance support: No upper extremity  supported, Feet supported Sitting balance-Leahy Scale: Good     Standing balance support: Bilateral upper extremity supported Standing balance-Leahy Scale: Fair Standing balance  comment: reliant on RW                            Cognition Arousal/Alertness: Awake/alert Behavior During Therapy: WFL for tasks assessed/performed Overall Cognitive Status: Within Functional Limits for tasks assessed                                 General Comments: no increased time needed for following commands.        Exercises General Exercises - Lower Extremity Hip Flexion/Marching: AROM, Strengthening, Both, 10 reps, Seated Other Exercises Other Exercises: Pt educated on benefits of hip flexor strengthening to assist in ankle/foot clearance to reduce falls risk from B foot drop.    General Comments        Pertinent Vitals/Pain Pain Assessment Pain Assessment: No/denies pain    Home Living                          Prior Function            PT Goals (current goals can now be found in the care plan section) Acute Rehab PT Goals Patient Stated Goal: to get stronger PT Goal Formulation: With patient/family Time For Goal Achievement: 11/21/22 Potential to Achieve Goals: Good Progress towards PT goals: Progressing toward goals    Frequency    Min 2X/week      PT Plan Current plan remains appropriate    Co-evaluation              AM-PAC PT "6 Clicks" Mobility   Outcome Measure  Help needed turning from your back to your side while in a flat bed without using bedrails?: A Little Help needed moving from lying on your back to sitting on the side of a flat bed without using bedrails?: A Lot Help needed moving to and from a bed to a chair (including a wheelchair)?: A Lot Help needed standing up from a chair using your arms (e.g., wheelchair or bedside chair)?: A Little Help needed to walk in hospital room?: A Lot Help needed climbing 3-5 steps with a railing? : A Lot 6 Click Score: 14    End of Session Equipment Utilized During Treatment: Gait belt Activity Tolerance: Patient tolerated treatment well Patient left:  in chair;with call bell/phone within reach;with family/visitor present Nurse Communication: Mobility status PT Visit Diagnosis: Muscle weakness (generalized) (M62.81);Difficulty in walking, not elsewhere classified (R26.2);History of falling (Z91.81)     Time: 3299-2426 PT Time Calculation (min) (ACUTE ONLY): 19 min  Charges:  $Therapeutic Exercise: 8-22 mins                     Salem Caster. Fairly IV, PT, DPT Physical Therapist- [facility]  11/12/2022, 11:05 AM

## 2022-11-12 NOTE — Progress Notes (Signed)
[facility] Progress Note  Patient: [name]    JTT:[id number]  DOA: 11/06/2022     Date of Service: the patient was seen and examined on 11/12/2022  Chief Complaint  Patient presents with   Fall   Weakness   Brief hospital course: [name] is a 89 y.o. male with medical history significant of myasthenia gravis on CellCept, hypertension, TIA, hyperlipidemia, HFpEF, BPH, type 2 diabetes, chronic hyponatremia, who presents to the ED due to generalized weakness.  ED workup: Hyponatremia sodium 124, leukocytosis  CTA chest negative for PE, shows gallstone, general surgery was consulted, HIDA scan was ordered.  Patient received 1 dose of antibiotics.  Further management as below.   Assessment and Plan:  Generalized weakness, unknown cause could be due to hyponatremia CT head negative for any acute findings Continue fall precautions PT OT eval done, recommend SNF placement   Chronic hypotonic hyponatremia Sodium 124 on admission Na 124--123--125--127--130--132--130  Serum osmolality 269 low, and CK 278 wnl BNP 155--316 slightly elevated Started sodium chloride 1 g p.o. 3 times daily, continue fluid restriction 1.5 L/day 1/4 Lasix 20 mg IV twice daily x 2 days TTE shows LVEF 55 to 00%, grade 1 diastolic dysfunction. 1/6 increase Lasix 40 mg IV twice daily due to pleural effusion and pulmonary edema on x-ray   Leukocytosis, unknown cause, could be due to CellCept. Resolved  On CT imaging, there was noted to be a 2.4 cm gallstone in the gallbladder neck with surrounding edema and gallbladder wall thickening.  Suspected cholecystitis, noted, HIDA scan negative [place] abd shows tiny gallstone, no any other acute findings UA negative CXR There are no signs of pulmonary edema or focal pulmonary consolidation. Increased interstitial markings are seen in the lower lung fields, more so on the right side suggesting scarring or interstitial pneumonia. Negative COVID, RSV  and influenza S/p Zosyn one-time dose given in the ED Hold off antibiotics, trend WBC count General surgery was consulted, recommended no intervention and signed off. Procalcitonin level 0.57 at lower end 1/6 CXR There is increase in infiltrates in both lower lung fields suggesting worsening atelectasis/pneumonia. Possible small bilateral pleural effusions. 1/6 patient is saturating well on room air, denies any difficulty breathing, leukocytosis resolved, afebrile.  Continue Lasix as above.   Myasthenia gravis Patient states he has been in remission for years and does not believe he has ever had a flare. -Continue home CellCept negative inspiratory pressure -40, good effort noted by RT Continue to monitor for worsening of myasthenia gravis symptoms.    Rib fracture CT imaging notable for acute nondisplaced fractures of bilateral anterior seventh ribs.  Likely from recent fall although patient believes it may be from a viral illness that he had several weeks ago. - Pain control with Norco - Incentive spirometry 1/4 CXR: No definite acute infiltrate, pleural effusion, or pneumothorax. Chronic bronchitic changes with increased bibasilar atelectasis.   Diabetes mellitus - SSI, moderate - Hold home metformin  Folic acid level 6.1, at lower end, started folic acid 1 g p.o. daily. Vitamin B12 level 876, within normal range. Vitamin D insufficiency: started vitamin D 50,000 units p.o. weekly, follow with PCP to repeat vitamin D level after 3 to 6 months.   Urinary retention, PVR 292 ml as per bladder scan done by RN Foley catheter was inserted on 1/4 Continue Proscar and Flomax DC Foley on 1/8, patient passed voiding trial   Body mass index is 29.42 kg/m.   Interventions:  Diet: Carb modified DVT Prophylaxis: Subcutaneous Lovenox   Advance goals of care discussion: DNR  Family Communication: family was present at bedside, at the time of interview.  The pt provided  permission to discuss medical plan with the family. Opportunity was given to ask question and all questions were answered satisfactorily.   Disposition:  Pt is from Home, admitted with fall and generalized weakness, hyponatremia, still has low sodium, which precludes a safe discharge. Discharge to SNF, when clinically stable, may need 1 more day.  Subjective: No significant overnight events, patient denies any shortness of breath, no abdominal pain, no any other active issues.  Foley catheter was discontinued the morning, patient was able to void x 2, bladder scan was done which was negative for any urinary retention. Patient was resting comfortably on the recliner.    Physical Exam: General:  alert oriented to time, place, and person.  Appear in no distress, affect appropriate Eyes: PERRLA ENT: Oral Mucosa Clear, moist  Neck: no JVD,  Cardiovascular: S1 and S2 Present, no Murmur,  Respiratory: good respiratory effort, Bilateral Air entry equal and Decreased, mild bibasilar crackles, no wheezes Abdomen: Bowel Sound present, Soft and no tenderness,  Skin: no rashes Extremities: no Pedal edema, no calf tenderness Neurologic: without any new focal findings Gait not checked due to patient safety concerns  Vitals:   11/11/22 1638 11/11/22 2110 11/12/22 0457 11/12/22 0740  BP: (!) 127/54 116/82 (!) 138/58 (!) 143/59  Pulse: 68 65 65 67  Resp: '18 18 18 16  '$ Temp: 98.4 F (36.9 C) 97.9 F (36.6 C) 98.2 F (36.8 C) 98 F (36.7 C)  TempSrc:  Oral Oral   SpO2: 94% 96% 94% 94%  Weight:      Height:        Intake/Output Summary (Last 24 hours) at 11/12/2022 1406 Last data filed at 11/12/2022 0017 Gross per 24 hour  Intake --  Output 1726 ml  Net -1726 ml    Filed Weights   11/06/22 1535  Weight: 93 kg    Data Reviewed: I have personally reviewed and interpreted daily labs, tele strips, imagings as discussed above. I reviewed all nursing notes, pharmacy notes, vitals, pertinent  old records I have discussed plan of care as described above with RN and patient/family.  CBC: Recent Labs  Lab 11/06/22 1423 11/07/22 0445 11/08/22 0515 11/09/22 0416 11/10/22 0525 11/11/22 0416 11/12/22 0454  WBC 19.9* 18.6* 17.2* 13.8* 9.2 8.1 9.3  NEUTROABS 17.3* 16.4*  --   --   --   --   --   HGB 11.2* 9.7* 9.8* 9.3* 9.8* 9.9* 10.4*  HCT 33.3* 28.5* 28.9* 27.3* 29.0* 29.5* 31.0*  MCV 91.5 90.5 90.0 88.3 88.4 88.3 90.1  PLT 402* 353 334 402* 475* 495* 494*   Basic Metabolic Panel: Recent Labs  Lab 11/08/22 0515 11/08/22 2211 11/09/22 0416 11/09/22 1700 11/10/22 0525 11/10/22 1656 11/11/22 0416 11/11/22 1707 11/12/22 0454  NA 125*   < > 127*   < > 130* 128* 132* 128* 130*  K 3.8   < > 3.4*   < > 3.8 4.4 3.4* 3.9 3.6  CL 93*   < > 93*   < > 95* 93* 95* 93* 94*  CO2 24   < > 24   < > '26 25 27 26 26  '$ GLUCOSE 114*   < > 98   < > 112* 198* 123* 139* 116*  BUN 21   < > 17   < >  $'16 17 16 18 16  'E$ CREATININE 0.67   < > 0.55*   < > 0.48* 0.70 0.59* 0.62 0.61  CALCIUM 8.1*   < > 8.0*   < > 8.3* 8.3* 8.1* 8.2* 8.2*  MG 2.0  --  1.8  --  1.8  --  1.7  --   --   PHOS 3.2  --  3.8  --  3.1  --  3.6  --   --    < > = values in this interval not displayed.    Studies: No results found.  Scheduled Meds:  amLODipine  10 mg Oral QHS   Chlorhexidine Gluconate Cloth  6 each Topical Daily   enoxaparin (LOVENOX) injection  40 mg Subcutaneous QHS   finasteride  5 mg Oral QHS   folic acid  1 mg Oral Daily   furosemide  40 mg Intravenous BID   gabapentin  100 mg Oral QHS   insulin aspart  0-15 Units Subcutaneous TID WC   irbesartan  300 mg Oral Daily   mycophenolate  1,000 mg Oral BID   pravastatin  20 mg Oral q1800   sodium chloride  1 g Oral TID WC   tamsulosin  0.4 mg Oral QPC breakfast   Vitamin D (Ergocalciferol)  50,000 Units Oral Q7 days   Continuous Infusions: PRN Meds: acetaminophen **OR** acetaminophen, guaiFENesin-dextromethorphan, HYDROcodone-acetaminophen,  ondansetron **OR** ondansetron (ZOFRAN) IV, polyethylene glycol  Time spent: 40 minutes  Author: Val Riles. MD [facility] 11/12/2022 2:06 PM  To reach On-call, see care teams to locate the attending and reach out to them via [email or web address]. If 7PM-7AM, please contact night-coverage If you still have difficulty reaching the attending provider, please page the [facility] (Director on Call) for [facility] on amion for assistance.

## 2022-11-12 NOTE — Plan of Care (Signed)
Patient's FVC is 1.61 and NIF is -40. Good effort noted

## 2022-11-12 NOTE — Progress Notes (Addendum)
CSW rec'd a call from pts son, Dominica Severin [phone]) asking CSW to follow-up with [facility] on a bed offer that his family member states has become available.  Per weekend CSW, family had chosen [facility]  CSW phone Seth Bake at [facility] to verify information.  Left vm message for a return call.  2:40  referral re-sent per Seth Bake at [facility]. She states that she spoke with the family and told them that, if something comes up, she would let them know.  She said that she told them that she can't guarantee a bed.  2:53  Phoned Dominica Severin to discuss bed offers.  CSW informed Dominica Severin that [facility] still has the bed for pt pending him being medically ready for discharge.

## 2022-11-12 NOTE — Progress Notes (Signed)
Unable to complete NIF / VC at this time, patient sleeping

## 2022-11-13 ENCOUNTER — Encounter: Payer: Self-pay | Admitting: Internal Medicine

## 2022-11-13 DIAGNOSIS — G7000 Myasthenia gravis without (acute) exacerbation: Secondary | ICD-10-CM | POA: Diagnosis not present

## 2022-11-13 DIAGNOSIS — R300 Dysuria: Secondary | ICD-10-CM | POA: Diagnosis not present

## 2022-11-13 DIAGNOSIS — R278 Other lack of coordination: Secondary | ICD-10-CM | POA: Diagnosis not present

## 2022-11-13 DIAGNOSIS — C801 Malignant (primary) neoplasm, unspecified: Secondary | ICD-10-CM | POA: Diagnosis not present

## 2022-11-13 DIAGNOSIS — S2243XA Multiple fractures of ribs, bilateral, initial encounter for closed fracture: Secondary | ICD-10-CM | POA: Diagnosis not present

## 2022-11-13 DIAGNOSIS — E118 Type 2 diabetes mellitus with unspecified complications: Secondary | ICD-10-CM | POA: Diagnosis not present

## 2022-11-13 DIAGNOSIS — G2581 Restless legs syndrome: Secondary | ICD-10-CM | POA: Diagnosis not present

## 2022-11-13 DIAGNOSIS — E1129 Type 2 diabetes mellitus with other diabetic kidney complication: Secondary | ICD-10-CM | POA: Diagnosis not present

## 2022-11-13 DIAGNOSIS — R2689 Other abnormalities of gait and mobility: Secondary | ICD-10-CM | POA: Diagnosis not present

## 2022-11-13 DIAGNOSIS — G459 Transient cerebral ischemic attack, unspecified: Secondary | ICD-10-CM | POA: Diagnosis not present

## 2022-11-13 DIAGNOSIS — Z743 Need for continuous supervision: Secondary | ICD-10-CM | POA: Diagnosis not present

## 2022-11-13 DIAGNOSIS — R41841 Cognitive communication deficit: Secondary | ICD-10-CM | POA: Diagnosis not present

## 2022-11-13 DIAGNOSIS — M6281 Muscle weakness (generalized): Secondary | ICD-10-CM | POA: Diagnosis not present

## 2022-11-13 DIAGNOSIS — E785 Hyperlipidemia, unspecified: Secondary | ICD-10-CM | POA: Diagnosis not present

## 2022-11-13 DIAGNOSIS — S2232XD Fracture of one rib, left side, subsequent encounter for fracture with routine healing: Secondary | ICD-10-CM | POA: Diagnosis not present

## 2022-11-13 DIAGNOSIS — R531 Weakness: Secondary | ICD-10-CM | POA: Diagnosis not present

## 2022-11-13 DIAGNOSIS — R2681 Unsteadiness on feet: Secondary | ICD-10-CM | POA: Diagnosis not present

## 2022-11-13 DIAGNOSIS — R296 Repeated falls: Secondary | ICD-10-CM | POA: Diagnosis not present

## 2022-11-13 DIAGNOSIS — D72829 Elevated white blood cell count, unspecified: Secondary | ICD-10-CM | POA: Diagnosis not present

## 2022-11-13 DIAGNOSIS — D508 Other iron deficiency anemias: Secondary | ICD-10-CM | POA: Diagnosis not present

## 2022-11-13 DIAGNOSIS — I959 Hypotension, unspecified: Secondary | ICD-10-CM | POA: Diagnosis not present

## 2022-11-13 DIAGNOSIS — E871 Hypo-osmolality and hyponatremia: Secondary | ICD-10-CM | POA: Diagnosis not present

## 2022-11-13 DIAGNOSIS — E538 Deficiency of other specified B group vitamins: Secondary | ICD-10-CM | POA: Insufficient documentation

## 2022-11-13 DIAGNOSIS — S2231XD Fracture of one rib, right side, subsequent encounter for fracture with routine healing: Secondary | ICD-10-CM | POA: Diagnosis not present

## 2022-11-13 DIAGNOSIS — N401 Enlarged prostate with lower urinary tract symptoms: Secondary | ICD-10-CM | POA: Diagnosis not present

## 2022-11-13 DIAGNOSIS — R338 Other retention of urine: Secondary | ICD-10-CM | POA: Diagnosis not present

## 2022-11-13 DIAGNOSIS — I10 Essential (primary) hypertension: Secondary | ICD-10-CM | POA: Diagnosis not present

## 2022-11-13 LAB — BASIC METABOLIC PANEL
Anion gap: 9 (ref 5–15)
BUN: 21 mg/dL (ref 8–23)
CO2: 27 mmol/L (ref 22–32)
Calcium: 8.2 mg/dL — ABNORMAL LOW (ref 8.9–10.3)
Chloride: 95 mmol/L — ABNORMAL LOW (ref 98–111)
Creatinine, Ser: 0.69 mg/dL (ref 0.61–1.24)
GFR, Estimated: >60 mL/min (ref >60)
Glucose, Bld: 128 mg/dL — ABNORMAL HIGH (ref 70–99)
Potassium: 3.4 mmol/L — ABNORMAL LOW (ref 3.5–5.1)
Sodium: 131 mmol/L — ABNORMAL LOW (ref 135–145)

## 2022-11-13 LAB — CBC
HCT: 28.9 % — ABNORMAL LOW (ref 39.0–52.0)
Hemoglobin: 9.7 g/dL — ABNORMAL LOW (ref 13.0–17.0)
MCH: 30.3 pg (ref 26.0–34.0)
MCHC: 33.6 g/dL (ref 30.0–36.0)
MCV: 90.3 fL (ref 80.0–100.0)
Platelets: 497 10*3/uL — ABNORMAL HIGH (ref 150–400)
RBC: 3.2 MIL/uL — ABNORMAL LOW (ref 4.22–5.81)
RDW: 13.1 % (ref 11.5–15.5)
WBC: 12.2 10*3/uL — ABNORMAL HIGH (ref 4.0–10.5)
nRBC: 0 % (ref 0.0–0.2)

## 2022-11-13 LAB — GLUCOSE, CAPILLARY
Glucose-Capillary: 138 mg/dL — ABNORMAL HIGH (ref 70–99)
Glucose-Capillary: 129 mg/dL — ABNORMAL HIGH (ref 70–99)

## 2022-11-13 LAB — PHOSPHORUS: Phosphorus: 3.8 mg/dL (ref 2.5–4.6)

## 2022-11-13 LAB — MAGNESIUM: Magnesium: 1.9 mg/dL (ref 1.7–2.4)

## 2022-11-13 MED ORDER — SODIUM CHLORIDE 1 G PO TABS: 1.0000 g | ORAL_TABLET | Freq: Two times a day (BID) | ORAL | 0 refills | Status: AC

## 2022-11-13 MED ORDER — FUROSEMIDE 40 MG PO TABS: 40.0000 mg | ORAL_TABLET | Freq: Every day | ORAL | 11 refills | Status: DC

## 2022-11-13 MED ORDER — POTASSIUM CHLORIDE CRYS ER 20 MEQ PO TBCR: 20.0000 meq | EXTENDED_RELEASE_TABLET | Freq: Every day | ORAL | Status: DC

## 2022-11-13 MED ORDER — SODIUM CHLORIDE 1 G PO TABS
1.0000 g | ORAL_TABLET | Freq: Three times a day (TID) | ORAL | Status: DC
  Administered 2022-11-13 (×2): 1 g via ORAL
  Filled 2022-11-13 (×2): qty 1

## 2022-11-13 MED ORDER — POTASSIUM CHLORIDE CRYS ER 20 MEQ PO TBCR: 20.0000 meq | EXTENDED_RELEASE_TABLET | Freq: Every day | ORAL | 0 refills | Status: DC

## 2022-11-13 MED ORDER — POTASSIUM CHLORIDE CRYS ER 20 MEQ PO TBCR
40.0000 meq | EXTENDED_RELEASE_TABLET | Freq: Once | ORAL | Status: AC
  Administered 2022-11-13: 40 meq via ORAL
  Filled 2022-11-13: qty 2

## 2022-11-13 MED ORDER — VITAMIN D (ERGOCALCIFEROL) 1.25 MG (50000 UNIT) PO CAPS: 50000.0000 [IU] | ORAL_CAPSULE | ORAL | 0 refills | Status: AC

## 2022-11-13 MED ORDER — FOLIC ACID 1 MG PO TABS: 1.0000 mg | ORAL_TABLET | Freq: Every day | ORAL | 2 refills | Status: DC

## 2022-11-13 MED ORDER — SODIUM CHLORIDE 1 G PO TABS: 1.0000 g | ORAL_TABLET | Freq: Three times a day (TID) | ORAL | Status: DC

## 2022-11-13 MED ORDER — FUROSEMIDE 40 MG PO TABS
40.0000 mg | ORAL_TABLET | Freq: Every day | ORAL | Status: DC
  Administered 2022-11-13: 40 mg via ORAL
  Filled 2022-11-13: qty 1

## 2022-11-13 NOTE — Discharge Summary (Signed)
[facility] Discharge Summary   Patient: [name] KKX:[id number]  PCP: Einar Pheasant, MD  Date of admission: 11/06/2022   Date of discharge:  11/13/2022     Discharge Diagnoses:  Principal Problem:   Generalized weakness Active Problems:   Hyponatremia   Leukocytosis   Cholecystitis   Myasthenia gravis ([place])   Rib fracture   Diabetes mellitus ([place])   Admitted From: Home Disposition:  SNF   Recommendations for Outpatient Follow-up:  PCP: Follow-up with PCP, repeat CBC and BMP after 1 week to check sodium, potassium and WBC count. Prescribed sodium chloride tablets, continued Lasix daily dosing and oral potassium supplement.  Please change medications according to blood work next week. Needs to follow with PCP for folic acid level and vitamin D level after 3 to 6 months Follow up LABS/TEST:  as above   Contact information for after-discharge care     [facility] .   Service: Skilled Nursing Contact information: [address] [place] [phone]                    Diet recommendation: Carb modified diet and heart healthy, fluid restriction 1.5 L/day  Activity: The patient is advised to gradually reintroduce usual activities, as tolerated  Discharge Condition: stable  Code Status: DNR   History of present illness: As per the H and P dictated on admission  Hospital Course:  JAMALL STROHMEIER is a 89 y.o. male with medical history significant of myasthenia gravis on CellCept, hypertension, TIA, hyperlipidemia, HFpEF, BPH, type 2 diabetes, chronic hyponatremia, who presents to the ED due to generalized weakness.  ED workup: Hyponatremia sodium 124, leukocytosis  CTA chest negative for PE, shows gallstone, general surgery was consulted, HIDA scan was ordered.  Patient received 1 dose of antibiotics.  Further management as below.     Assessment and  Plan: # Generalized weakness, unknown cause could be due to hyponatremia CT head negative for any acute findings, Continue fall precautions PT OT eval done, recommend SNF placement  # Chronic hypotonic hyponatremia, Sodium 124 on admission Na 124---131 gradually improving, Serum osmolality 269 low, and CK 278 wnl BNP 155--316 slightly elevated. Started sodium chloride 1 g p.o. 3 times daily, continue fluid restriction 1.5 L/day. On 1/4 Lasix 20 mg IV twice daily x 2 days TTE shows LVEF 55 to 01%, grade 1 diastolic dysfunction. On 1/6 increase Lasix 40 mg IV twice daily due to pleural effusion and pulmonary edema on x-ray.  Continue Lasix 40 mg p.o. daily with oral potassium supplement continue sodium chloride tablets.  Repeat BMP after 1 week and titrate medications accordingly.  # Leukocytosis, unknown cause, could be due to CellCept. Resolved  On CT imaging, there was noted to be a 2.4 cm gallstone in the gallbladder neck with surrounding edema and gallbladder wall thickening. Suspected cholecystitis, noted, HIDA scan negative.  [place] abd shows tiny gallstone, no any other acute findings. UA negative CXR There are no signs of pulmonary edema or focal pulmonary consolidation. Increased interstitial markings are seen in the lower lung fields, more so on the right side suggesting scarring or interstitial pneumonia. Negative COVID, RSV and influenza. S/p Zosyn one-time dose given in the ED Hold off antibiotics, trend WBC count. General surgery was consulted, recommended no intervention and signed off. Procalcitonin level 0.57 at lower end 1/6 CXR There is increase in infiltrates in both lower lung fields suggesting worsening  atelectasis/pneumonia. Possible small bilateral pleural effusions. 1/6 patient is saturating well on room air, denies any difficulty breathing, leukocytosis resolved, afebrile.  Continue Lasix as above. On 1/9 wbc 12.2 increased but no any signs and symptoms of infection.  Continue  to monitor vital signs and watch for any symptoms of UTI as patient had Foley catheter during hospitalization.  May start antibiotics if any symptoms of UTI.    # Myasthenia gravis, Patient states he has been in remission for years and does not believe he has ever had a flare. Continue home CellCept, negative inspiratory pressure NIV -40 and FVC 1.58 done by RT. Continue to monitor for worsening of myasthenia gravis symptoms. # Rib fracture: CT imaging notable for acute nondisplaced fractures of bilateral anterior seventh ribs.  Likely from recent fall although patient believes it may be from a viral illness that he had several weeks ago. Continue Incentive spirometry 1/4 CXR: No definite acute infiltrate, pleural effusion, or pneumothorax. Chronic bronchitic changes with increased bibasilar atelectasis. # Diabetes mellitus, continue diabetic diet, resumed metformin # Folic acid level 6.1, at lower end, started folic acid 1 g p.o. daily. Vitamin B12 level 876, within normal range. # Vitamin D insufficiency: started vitamin D 50,000 units p.o. weekly, follow with PCP to repeat vitamin D level after 3 to 6 months. # Urinary retention, PVR 292 ml as per bladder scan done by RN Foley catheter was inserted on 1/4 and Continue Proscar and Flomax. DC'd Foley on 1/8, patient passed voiding trial  Body mass index is 29.42 kg/m.  Interventions:   Patient was seen by physical therapy, who recommended SNF placement, which was arranged. On the day of the discharge the patient's vitals were stable, and no other acute medical condition were reported by patient. the patient was felt safe to be discharge at [facility].  Consultants: General surgery, recommended no intervention Procedures: NOne  Discharge Exam: General: Appear in no distress, no Rash; Oral Mucosa Clear, moist. Cardiovascular: S1 and S2 Present, no Murmur, Respiratory: normal respiratory effort, Bilateral Air entry present and no Crackles, no  wheezes Abdomen: Bowel Sound present, Soft and no tenderness, no hernia Extremities: no Pedal edema, no calf tenderness Neurology: alert and oriented to time, place, and person affect appropriate.  Filed Weights   11/06/22 1535  Weight: 93 kg   Vitals:   11/13/22 0737 11/13/22 0913  BP: (!) 130/52 (!) 122/51  Pulse: 64 65  Resp: 16   Temp: 98.6 F (37 C)   SpO2: 95%     DISCHARGE MEDICATION: Allergies as of 11/13/2022       Reactions   Magnesium Other (See Comments)   Myasthenia gravis   Penicillamine Other (See Comments)   Myasthenia gravis        Medication List     TAKE these medications    Accu-Chek FastClix Lancets Misc USE 1  TO CHECK SUGARS ONCE DAILY. Dx E11.9   Accu-Chek Guide test strip Generic drug: glucose blood USE 1 STRIP TO CHECK GLUCOSE ONCE DAILY DX E 11.9   amLODipine 10 MG tablet Commonly known as: NORVASC Take 1 tablet (10 mg total) by mouth daily.   aspirin EC 81 MG tablet Take 81 mg by mouth daily.   calcium-vitamin D 500-200 MG-UNIT tablet Commonly known as: OSCAL WITH D Take 1 tablet by mouth 2 (two) times daily.   ferrous sulfate 325 (65 FE) MG EC tablet Take 325 mg by mouth 3 (three) times a week. Takes three times weekly. Monday  Weds, Friday   finasteride 5 MG tablet Commonly known as: PROSCAR Take 1 tablet by mouth once daily   folic acid 1 MG tablet Commonly known as: FOLVITE Take 1 tablet (1 mg total) by mouth daily. Start taking on: November 14, 2022   furosemide 40 MG tablet Commonly known as: LASIX Take 1 tablet (40 mg total) by mouth daily. What changed:  medication strength See the new instructions.   gabapentin 100 MG capsule Commonly known as: NEURONTIN Take 1 capsule (100 mg total) by mouth at bedtime.   lovastatin 20 MG tablet Commonly known as: MEVACOR Take 1 tablet (20 mg total) by mouth at bedtime.   metFORMIN 500 MG 24 hr tablet Commonly known as: GLUCOPHAGE-XR Take 1 tablet by mouth once daily  with breakfast   mometasone 0.1 % cream Commonly known as: ELOCON Apply to rash on abdomen once or twice a day prn   mycophenolate 500 MG tablet Commonly known as: CELLCEPT Take 1,000 mg by mouth 2 (two) times daily.   olmesartan 40 MG tablet Commonly known as: BENICAR Take 1 tablet by mouth once daily   potassium chloride SA 20 MEQ tablet Commonly known as: KLOR-CON M Take 1 tablet (20 mEq total) by mouth daily. Start taking on: November 14, 2022   sodium chloride 1 g tablet Take 1 tablet (1 g total) by mouth 2 (two) times daily with a meal.   tamsulosin 0.4 MG Caps capsule Commonly known as: FLOMAX TAKE 1 CAPSULE BY MOUTH ONCE DAILY AFTER  SUPPER What changed:  how much to take when to take this   Vitamin D (Ergocalciferol) 1.25 MG (50000 UNIT) Caps capsule Commonly known as: DRISDOL Take 1 capsule (50,000 Units total) by mouth every 7 (seven) days. Start taking on: November 14, 2022       Allergies  Allergen Reactions   Magnesium Other (See Comments)    Myasthenia gravis   Penicillamine Other (See Comments)    Myasthenia gravis   Discharge Instructions     Call MD for:  difficulty breathing, headache or visual disturbances   Complete by: As directed    Call MD for:  extreme fatigue   Complete by: As directed    Call MD for:  persistant dizziness or light-headedness   Complete by: As directed    Call MD for:  severe uncontrolled pain   Complete by: As directed    Call MD for:  temperature >100.4   Complete by: As directed    Diet - low sodium heart healthy   Complete by: As directed    Continue fluid restriction 1.5 L/day   Discharge instructions   Complete by: As directed    Follow-up with PCP, repeat CBC and BMP after 1 week to check sodium, potassium and WBC count. Prescribed sodium chloride tablets, continued Lasix daily dosing and oral potassium supplement.  Please change medications according to blood work next week. Needs to follow with PCP for  folic acid level and vitamin D level after 3 to 6 months.   Increase activity slowly   Complete by: As directed        The results of significant diagnostics from this hospitalization (including imaging, microbiology, ancillary and laboratory) are listed below for reference.    Significant Diagnostic Studies: DG Chest Port 1 View  Result Date: 11/10/2022 CLINICAL DATA:  Shortness of breath EXAM: PORTABLE CHEST 1 VIEW COMPARISON:  11/08/2022 FINDINGS: There is poor inspiration. Transverse diameter of heart is increased. There is slight  decrease in interstitial markings in parahilar regions. There are patchy densities in both lower lung fields with interval worsening. Lateral CP angles are indistinct. There is no pneumothorax. IMPRESSION: There is increase in infiltrates in both lower lung fields suggesting worsening atelectasis/pneumonia. Possible small bilateral pleural effusions. Electronically Signed   By: Elmer Picker M.D.   On: 11/10/2022 10:16   ECHOCARDIOGRAM COMPLETE  Result Date: 11/08/2022    ECHOCARDIOGRAM REPORT   Patient Name:   EMAURI KRYGIER Date of Exam: 11/08/2022 Medical Rec #:  [id number]           Height:       70.0 in Accession #:    [id number]          Weight:       205.0 lb Date of Birth:  12/09/1925          BSA:          2.109 m Patient Age:    53 years            BP:           132/62 mmHg Patient Gender: M                   HR:           82 bpm. Exam Location:  [facility] Procedure: 2D Echo, Cardiac Doppler and Color Doppler Indications:     CHF  History:         Patient has prior history of Echocardiogram examinations, most                  recent 08/22/2020. TIA and Stroke, Signs/Symptoms:Fatigue and                  Shortness of Breath; Risk Factors:Hypertension and Diabetes.  Sonographer:     Wenda Low Referring Phys:  [id number] Val Riles Diagnosing Phys: Ida Rogue MD IMPRESSIONS  1. Left ventricular ejection fraction, by estimation, is 55 to 60%. The left  ventricle has normal function. The left ventricle has no regional wall motion abnormalities. Left ventricular diastolic parameters are indeterminate (TDI measurements concerning for grade II diastolic dysfunction).  2. Right ventricular systolic function is normal. The right ventricular size is normal. There is mildly elevated pulmonary artery systolic pressure. The estimated right ventricular systolic pressure is 25.3 mmHg.  3. The mitral valve is normal in structure. Mild mitral valve regurgitation. No evidence of mitral stenosis.  4. The aortic valve is normal in structure. Aortic valve regurgitation is not visualized. Aortic valve sclerosis is present, with no evidence of aortic valve stenosis.  5. The inferior vena cava is normal in size with greater than 50% respiratory variability, suggesting right atrial pressure of 3 mmHg. FINDINGS  Left Ventricle: Left ventricular ejection fraction, by estimation, is 55 to 60%. The left ventricle has normal function. The left ventricle has no regional wall motion abnormalities. The left ventricular internal cavity size was normal in size. There is  no left ventricular hypertrophy. Left ventricular diastolic parameters are indeterminate. Right Ventricle: The right ventricular size is normal. No increase in right ventricular wall thickness. Right ventricular systolic function is normal. There is mildly elevated pulmonary artery systolic pressure. The tricuspid regurgitant velocity is 2.92  m/s, and with an assumed right atrial pressure of 3 mmHg, the estimated right ventricular systolic pressure is 66.4 mmHg. Left Atrium: Left atrial size was normal in size. Right Atrium: Right atrial size was normal in size. Pericardium: There is  no evidence of pericardial effusion. Mitral Valve: The mitral valve is normal in structure. Mild mitral valve regurgitation. No evidence of mitral valve stenosis. MV peak gradient, 4.3 mmHg. The mean mitral valve gradient is 2.0 mmHg. Tricuspid  Valve: The tricuspid valve is normal in structure. Tricuspid valve regurgitation is mild . No evidence of tricuspid stenosis. Aortic Valve: The aortic valve is normal in structure. Aortic valve regurgitation is not visualized. Aortic valve sclerosis is present, with no evidence of aortic valve stenosis. Aortic valve mean gradient measures 4.0 mmHg. Aortic valve peak gradient measures 9.1 mmHg. Aortic valve area, by VTI measures 2.75 cm. Pulmonic Valve: The pulmonic valve was normal in structure. Pulmonic valve regurgitation is not visualized. No evidence of pulmonic stenosis. Aorta: The aortic root is normal in size and structure. Venous: The inferior vena cava is normal in size with greater than 50% respiratory variability, suggesting right atrial pressure of 3 mmHg. IAS/Shunts: No atrial level shunt detected by color flow Doppler.  LEFT VENTRICLE PLAX 2D LVIDd:         4.40 cm   Diastology LVIDs:         2.40 cm   LV e' medial:    9.57 cm/s LV PW:         1.20 cm   LV E/e' medial:  11.7 LV IVS:        1.10 cm   LV e' lateral:   6.96 cm/s LVOT diam:     2.10 cm   LV E/e' lateral: 16.1 LV SV:         82 LV SV Index:   39 LVOT Area:     3.46 cm  RIGHT VENTRICLE RV Basal diam:  3.35 cm RV Mid diam:    3.10 cm RV S prime:     15.50 cm/s TAPSE (M-mode): 2.4 cm LEFT ATRIUM             Index        RIGHT ATRIUM           Index LA diam:        3.90 cm 1.85 cm/m   RA Area:     18.20 cm LA Vol (A2C):   50.2 ml 23.80 ml/m  RA Volume:   49.30 ml  23.37 ml/m LA Vol (A4C):   49.4 ml 23.42 ml/m LA Biplane Vol: 53.2 ml 25.22 ml/m  AORTIC VALVE                    PULMONIC VALVE AV Area (Vmax):    2.57 cm     PV Vmax:       1.30 m/s AV Area (Vmean):   2.82 cm     PV Peak grad:  6.8 mmHg AV Area (VTI):     2.75 cm AV Vmax:           151.00 cm/s AV Vmean:          94.200 cm/s AV VTI:            0.298 m AV Peak Grad:      9.1 mmHg AV Mean Grad:      4.0 mmHg LVOT Vmax:         112.00 cm/s LVOT Vmean:        76.800 cm/s LVOT  VTI:          0.237 m LVOT/AV VTI ratio: 0.80  AORTA Ao Root diam: 3.70 cm MITRAL VALVE  TRICUSPID VALVE MV Area (PHT): 2.91 cm     TR Peak grad:   34.1 mmHg MV Area VTI:   1.99 cm     TR Vmax:        292.00 cm/s MV Peak grad:  4.3 mmHg MV Mean grad:  2.0 mmHg     SHUNTS MV Vmax:       1.04 m/s     Systemic VTI:  0.24 m MV Vmean:      70.2 cm/s    Systemic Diam: 2.10 cm MV Decel Time: 261 msec MV E velocity: 112.00 cm/s MV A velocity: 94.30 cm/s MV E/A ratio:  1.19 Ida Rogue MD Electronically signed by Ida Rogue MD Signature Date/Time: 11/08/2022/4:24:52 PM    Final    DG Chest Port 1 View  Result Date: 11/08/2022 CLINICAL DATA:  Shortness of breath, hypoxemia EXAM: PORTABLE CHEST 1 VIEW COMPARISON:  Portable exam 0816 hours compared to 11/06/2022 FINDINGS: Upper normal heart size. Pulmonary vascular congestion. Atherosclerotic calcification aorta. Central peribronchial thickening, chronic Bibasilar atelectasis greater on RIGHT with chronic elevation of RIGHT diaphragm. No definite acute infiltrate, pleural effusion, or pneumothorax. Bones demineralized. IMPRESSION: Chronic bronchitic changes with increased bibasilar atelectasis. Electronically Signed   By: Lavonia Dana M.D.   On: 11/08/2022 08:59   NM Hepatobiliary Liver Func  Result Date: 11/07/2022 CLINICAL DATA:  Abdominal pain. Distended gallbladder on CT. Gallstones noted on ultrasound. EXAM: NUCLEAR MEDICINE HEPATOBILIARY IMAGING TECHNIQUE: Sequential images of the abdomen were obtained out to 60 minutes following intravenous administration of radiopharmaceutical. RADIOPHARMACEUTICALS:  5.3 mCi Tc-49m Choletec IV COMPARISON:  CT and ultrasound 11/06/2022 FINDINGS: Prompt uptake of radiotracer in liver. Counts are evident in the proximal small bowel by 30 minutes. No gallbladder evident at 45 minutes. At 45 minutes, 3 mg of IV morphine was administered to augment filling of the gallbladder. The gallbladder begins to fill after  morphine augmentation. IMPRESSION: 1. Filling of the gallbladder after morphine augmentation indicates a patent cystic duct. Findings are NOT consistent with acute cholecystitis. 2. Patent common bile duct. Electronically Signed   By: SSuzy BouchardM.D.   On: 11/07/2022 10:31   [place]Abdomen Limited RUQ (LIVER/GB)  Result Date: 11/06/2022 CLINICAL DATA:  Right upper quadrant pain EXAM: ULTRASOUND ABDOMEN LIMITED RIGHT UPPER QUADRANT COMPARISON:  None Available. FINDINGS: Gallbladder: There is an immobile stone within the gallbladder neck throughout the study. The gallbladder wall measures 4.1 mm. No Murphy's sign or pericholecystic fluid. Common bile duct: Diameter: 2.7 mm Liver: No focal lesion identified. Within normal limits in parenchymal echogenicity. Portal vein is patent on color Doppler imaging with normal direction of blood flow towards the liver. Other: None. IMPRESSION: 1. There is a stone in the neck of the gallbladder throughout the study with mild thickening of the gallbladder wall measuring 4.1 mm. No Murphy's sign or pericholecystic fluid. If the clinical picture is ambiguous, a HIDA scan could further evaluate for acute cholecystitis. Electronically Signed   By: DDorise BullionIII M.D.   On: 11/06/2022 19:40   CT Angio Chest Pulmonary Embolism (PE) W or WO Contrast  Result Date: 11/06/2022 CLINICAL DATA:  Fall 5 days ago.  Dry cough and mild chest pain. EXAM: CT ANGIOGRAPHY CHEST WITH CONTRAST TECHNIQUE: Multidetector CT imaging of the chest was performed using the standard protocol during bolus administration of intravenous contrast. Multiplanar CT image reconstructions and MIPs were obtained to evaluate the vascular anatomy. RADIATION DOSE REDUCTION: This exam was performed according to the departmental dose-optimization  program which includes automated exposure control, adjustment of the mA and/or kV according to patient size and/or use of iterative reconstruction technique. CONTRAST:   61m OMNIPAQUE IOHEXOL 350 MG/ML SOLN COMPARISON:  Same-day chest radiograph, lumbar spine MRI 07/25/2022 FINDINGS: Cardiovascular: There is adequate opacification of the pulmonary arteries to the segmental level. There is no evidence of pulmonary embolism. The heart size is normal. There is no significant pericardial effusion. Coronary artery calcifications and mild calcified plaque in the thoracic aorta are noted. Mediastinum/Nodes: The imaged thyroid is unremarkable. The esophagus is grossly unremarkable. There is a small hiatal hernia. There is no mediastinal, hilar, or axillary lymphadenopathy. Lungs/Pleura: The trachea and central airways are patent. Lung volumes are low. There is no overt pulmonary edema. There are small right larger than left pleural effusions with adjacent opacity likely reflecting atelectasis. The upper lobes and right middle lobe are well aerated. There is no suspicious nodule. There is no pneumothorax. Upper Abdomen: There is are acute nondisplaced fractures of the bilateral anterior seventh ribs. There is compression deformity of the T12 vertebral body and partially imaged compression deformity of the L1 vertebral body, unchanged since the prior lumbar spine MRI. There is multilevel degenerative change of the thoracic spine. Musculoskeletal: There is a 2.4 cm gallstone in the gallbladder neck with distention of the gallbladder, wall thickening, and surrounding fat stranding. Review of the MIP images confirms the above findings. IMPRESSION: 1. No evidence of pulmonary embolism. 2. Acute nondisplaced fractures of the bilateral anterior seventh ribs. No pneumothorax. 3. Small right larger than left pleural effusions with adjacent opacity likely reflecting atelectasis. 4. Findings consistent with acute cholecystitis with a 2.4 cm gallstone in the gallbladder neck. Electronically Signed   By: PValetta MoleM.D.   On: 11/06/2022 18:36   DG Chest 2 View  Result Date: 11/06/2022 CLINICAL  DATA:  Cough, weakness EXAM: CHEST - 2 VIEW COMPARISON:  Previous studies including the chest radiograph done on 08/01/2020, CT done on 10/28/2015 FINDINGS: Transverse diameter of heart is in the upper limits of normal. Right hemidiaphragm is elevated which may be due to eventration or paralysis. Increased interstitial markings are seen in the lower lung fields, more so on the right side. There is interval clearing of interstitial infiltrates in both parahilar regions. There is no pleural effusion or pneumothorax. IMPRESSION: There are no signs of pulmonary edema or focal pulmonary consolidation. Increased interstitial markings are seen in the lower lung fields, more so on the right side suggesting scarring or interstitial pneumonia. Electronically Signed   By: PElmer PickerM.D.   On: 11/06/2022 14:55   CT Head Wo Contrast  Result Date: 11/06/2022 CLINICAL DATA:  Headache with neuro deficit. EXAM: CT HEAD WITHOUT CONTRAST TECHNIQUE: Contiguous axial images were obtained from the base of the skull through the vertex without intravenous contrast. RADIATION DOSE REDUCTION: This exam was performed according to the departmental dose-optimization program which includes automated exposure control, adjustment of the mA and/or kV according to patient size and/or use of iterative reconstruction technique. COMPARISON:  None Available. FINDINGS: Brain: No acute intracranial hemorrhage. No focal mass lesion. No CT evidence of acute infarction. No midline shift or mass effect. No hydrocephalus. Basilar cisterns are patent. There are periventricular and subcortical white matter hypodensities. Generalized cortical atrophy. Vascular: No hyperdense vessel or unexpected calcification. Skull: Normal. Negative for fracture or focal lesion. Sinuses/Orbits: Paranasal sinuses and mastoid air cells are clear. Orbits are clear. Other: None. IMPRESSION: 1. No acute intracranial findings. 2. Atrophy and  mild microvascular disease.  Electronically Signed   By: Suzy Bouchard M.D.   On: 11/06/2022 14:45    Microbiology: Recent Results (from the past 240 hour(s))  Blood culture (routine x 2)     Status: None   Collection Time: 11/06/22  7:09 PM   Specimen: BLOOD  Result Value Ref Range Status   Specimen Description BLOOD BLOOD LEFT ARM  Final   Special Requests   Final    BOTTLES DRAWN AEROBIC AND ANAEROBIC Blood Culture adequate volume   Culture   Final    NO GROWTH 5 DAYS Performed at [facility], [address] [place], [place] [address]    Report Status 11/11/2022 FINAL  Final  Blood culture (routine x 2)     Status: None   Collection Time: 11/06/22  7:09 PM   Specimen: BLOOD  Result Value Ref Range Status   Specimen Description BLOOD BLOOD RIGHT ARM  Final   Special Requests   Final    BOTTLES DRAWN AEROBIC AND ANAEROBIC Blood Culture adequate volume   Culture   Final    NO GROWTH 5 DAYS Performed at [facility], [place] [place], [place] [address]    Report Status 11/11/2022 FINAL  Final  Resp panel by RT-PCR (RSV, Flu A&B, Covid) Anterior Nasal Swab     Status: None   Collection Time: 11/06/22  7:09 PM   Specimen: Anterior Nasal Swab  Result Value Ref Range Status   SARS Coronavirus 2 by RT PCR NEGATIVE NEGATIVE Final    Comment: (NOTE) SARS-CoV-2 target nucleic acids are NOT DETECTED.  The SARS-CoV-2 RNA is generally detectable in upper respiratory specimens during the acute phase of infection. The lowest concentration of SARS-CoV-2 viral copies this assay can detect is 138 copies/mL. A negative result does not preclude SARS-Cov-2 infection and should not be used as the sole basis for treatment or other patient management decisions. A negative result may occur with  improper specimen collection/handling, submission of specimen other than nasopharyngeal swab, presence of viral mutation(s) within the areas targeted by this assay, and inadequate number of  viral copies(<138 copies/mL). A negative result must be combined with clinical observations, patient history, and epidemiological information. The expected result is Negative.  Fact Sheet for Patients:  EntrepreneurPulse.com.au  Fact Sheet for Healthcare Providers:  IncredibleEmployment.be  This test is no t yet approved or cleared by the [place] FDA and  has been authorized for detection and/or diagnosis of SARS-CoV-2 by FDA under an Emergency Use Authorization (EUA). This EUA will remain  in effect (meaning this test can be used) for the duration of the COVID-19 declaration under Section 564(b)(1) of the Act, 21 U.S.C.section 360bbb-3(b)(1), unless the authorization is terminated  or revoked sooner.       Influenza A by PCR NEGATIVE NEGATIVE Final   Influenza B by PCR NEGATIVE NEGATIVE Final    Comment: (NOTE) The Xpert Xpress SARS-CoV-2/FLU/RSV plus assay is intended as an aid in the diagnosis of influenza from Nasopharyngeal swab specimens and should not be used as a sole basis for treatment. Nasal washings and aspirates are unacceptable for Xpert Xpress SARS-CoV-2/FLU/RSV testing.  Fact Sheet for Patients: EntrepreneurPulse.com.au  Fact Sheet for Healthcare Providers: IncredibleEmployment.be  This test is not yet approved or cleared by the [place] FDA and has been authorized for detection and/or diagnosis of SARS-CoV-2 by FDA under an Emergency Use Authorization (EUA). This EUA will remain in effect (meaning this test can be used) for  the duration of the COVID-19 declaration under Section 564(b)(1) of the Act, 21 U.S.C. section 360bbb-3(b)(1), unless the authorization is terminated or revoked.     Resp Syncytial Virus by PCR NEGATIVE NEGATIVE Final    Comment: (NOTE) Fact Sheet for Patients: EntrepreneurPulse.com.au  Fact Sheet for Healthcare  Providers: IncredibleEmployment.be  This test is not yet approved or cleared by the [place] FDA and has been authorized for detection and/or diagnosis of SARS-CoV-2 by FDA under an Emergency Use Authorization (EUA). This EUA will remain in effect (meaning this test can be used) for the duration of the COVID-19 declaration under Section 564(b)(1) of the Act, 21 U.S.C. section 360bbb-3(b)(1), unless the authorization is terminated or revoked.  Performed at [facility], [address] [place], [place] [address]      Labs: CBC: Recent Labs  Lab 11/06/22 1423 11/07/22 0445 11/08/22 0515 11/09/22 0416 11/10/22 0525 11/11/22 0416 11/12/22 0454 11/13/22 0553  WBC 19.9* 18.6*   < > 13.8* 9.2 8.1 9.3 12.2*  NEUTROABS 17.3* 16.4*  --   --   --   --   --   --   HGB 11.2* 9.7*   < > 9.3* 9.8* 9.9* 10.4* 9.7*  HCT 33.3* 28.5*   < > 27.3* 29.0* 29.5* 31.0* 28.9*  MCV 91.5 90.5   < > 88.3 88.4 88.3 90.1 90.3  PLT 402* 353   < > 402* 475* 495* 482* 497*   < > = values in this interval not displayed.   Basic Metabolic Panel: Recent Labs  Lab 11/08/22 0515 11/08/22 2211 11/09/22 0416 11/09/22 1700 11/10/22 0525 11/10/22 1656 11/11/22 0416 11/11/22 1707 11/12/22 0454 11/12/22 1723 11/13/22 0553  NA 125*   < > 127*   < > 130*   < > 132* 128* 130* 127* 131*  K 3.8   < > 3.4*   < > 3.8   < > 3.4* 3.9 3.6 3.8 3.4*  CL 93*   < > 93*   < > 95*   < > 95* 93* 94* 94* 95*  CO2 24   < > 24   < > 26   < > '27 26 26 24 27  '$ GLUCOSE 114*   < > 98   < > 112*   < > 123* 139* 116* 172* 128*  BUN 21   < > 17   < > 16   < > '16 18 16 21 21  '$ CREATININE 0.67   < > 0.55*   < > 0.48*   < > 0.59* 0.62 0.61 0.69 0.69  CALCIUM 8.1*   < > 8.0*   < > 8.3*   < > 8.1* 8.2* 8.2* 8.2* 8.2*  MG 2.0  --  1.8  --  1.8  --  1.7  --   --   --  1.9  PHOS 3.2  --  3.8  --  3.1  --  3.6  --   --   --  3.8   < > = values in this interval not displayed.   Liver Function  Tests: Recent Labs  Lab 11/06/22 1423 11/08/22 0515 11/09/22 0416 11/10/22 0525 11/11/22 0416  AST 37 30 45* 41 30  ALT '24 21 28 '$ 32 27  ALKPHOS 57 51 59 57 55  BILITOT 1.3* 1.1 1.1 1.1 0.9  PROT 7.6 5.8* 6.0* 6.1* 6.1*  ALBUMIN 3.0* 2.3* 2.3* 2.4* 2.3*   No results for input(s): "LIPASE", "AMYLASE" in the last  168 hours. No results for input(s): "AMMONIA" in the last 168 hours. Cardiac Enzymes: Recent Labs  Lab 11/06/22 1500  CKTOTAL 278   BNP (last 3 results) Recent Labs    11/06/22 1424 11/08/22 0515  BNP 155.2* 316.6*   CBG: Recent Labs  Lab 11/12/22 1219 11/12/22 1720 11/12/22 2113 11/13/22 0739 11/13/22 1134  GLUCAP 156* 175* 126* 138* 129*    Time spent: 35 minutes  Signed:  Val Riles  [facility]  11/13/2022 12:38 PM

## 2022-11-13 NOTE — Progress Notes (Signed)
Occupational Therapy Treatment Patient Details Name: [name] MRN: [id number] DOB: 1926-01-20 Today's Date: 11/13/2022   History of present illness presented to ER secondary to fall and progressive weakness; admitted for management of generalized weakness (likely multifactorial from prolonged down-time after fall, dehydration, hyponatremia).  Also noted with cholelithiasis and possible cholecystitis (work up ongoing).  Also noted with anterior rib fractures to bilat 7th ribs (non-displaced) and bilat pleural effusion (R > L)   OT comments  [name] was seen for OT treatment on this date. Upon arrival to room pt seated in chair, agreeable to tx. Pt requires MIN A x2 + RW sit<>stand from chair height, improves to +1 with pillow in chair for elevated height. Good static standing balance with single UE support. SETUP don/doff shoes in sitting. Pt making good progress toward goals, will continue to follow POC. Discharge recommendation remains appropriate.     Recommendations for follow up therapy are one component of a multi-disciplinary discharge planning process, led by the attending physician.  Recommendations may be updated based on patient status, additional functional criteria and insurance authorization.    Follow Up Recommendations  Skilled nursing-short term rehab (<3 hours/day)     Assistance Recommended at Discharge Frequent or constant Supervision/Assistance  Patient can return home with the following  A lot of help with walking and/or transfers;A lot of help with bathing/dressing/bathroom   Equipment Recommendations  Other (comment) (defer)    Recommendations for Other Services      Precautions / Restrictions Precautions Precautions: Fall Restrictions Weight Bearing Restrictions: No       Mobility Bed Mobility               General bed mobility comments: recieved and left sitting    Transfers Overall transfer level: Needs assistance Equipment  used: Rolling walker (2 wheels) Transfers: Sit to/from Stand Sit to Stand: Min assist, +2 safety/equipment                 Balance Overall balance assessment: Needs assistance Sitting-balance support: No upper extremity supported, Feet supported Sitting balance-Leahy Scale: Good     Standing balance support: Single extremity supported Standing balance-Leahy Scale: Good                             ADL either performed or assessed with clinical judgement   ADL Overall ADL's : Needs assistance/impaired                                       General ADL Comments: MIN A x2 + RW simulated BSC t/f.SETUP don/doff shoes in sitting      Cognition Arousal/Alertness: Awake/alert Behavior During Therapy: WFL for tasks assessed/performed Overall Cognitive Status: Within Functional Limits for tasks assessed                                                     Pertinent Vitals/ Pain       Pain Assessment Pain Assessment: Faces Faces Pain Scale: Hurts a little bit Pain Location: ribs Pain Descriptors / Indicators: Grimacing Pain Intervention(s): Limited activity within patient's tolerance   Frequency  Min 2X/week        Progress Toward Goals  OT  Goals(current goals can now be found in the care plan section)  Progress towards OT goals: Progressing toward goals  Acute Rehab OT Goals Patient Stated Goal: to get better OT Goal Formulation: With patient/family Time For Goal Achievement: 11/21/22 Potential to Achieve Goals: Good ADL Goals Pt Will Perform Grooming: with modified independence;sitting;standing Pt Will Perform Lower Body Dressing: with modified independence;sit to/from stand Pt Will Transfer to Toilet: with modified independence Pt Will Perform Toileting - Clothing Manipulation and hygiene: with modified independence;sit to/from stand  Plan Discharge plan remains appropriate    Co-evaluation                  AM-PAC OT "6 Clicks" Daily Activity     Outcome Measure   Help from another person eating meals?: None Help from another person taking care of personal grooming?: A Little Help from another person toileting, which includes using toliet, bedpan, or urinal?: A Lot Help from another person bathing (including washing, rinsing, drying)?: A Little Help from another person to put on and taking off regular upper body clothing?: A Little Help from another person to put on and taking off regular lower body clothing?: A Little 6 Click Score: 18    End of Session Equipment Utilized During Treatment: Rolling walker (2 wheels)  OT Visit Diagnosis: Unsteadiness on feet (R26.81);Muscle weakness (generalized) (M62.81)   Activity Tolerance Patient tolerated treatment well   Patient Left in chair;with call bell/phone within reach;with family/visitor present   Nurse Communication          Time: 0093-8182 OT Time Calculation (min): 12 min  Charges: OT General Charges $OT Visit: 1 Visit OT Treatments $Therapeutic Activity: 8-22 mins  Dessie Coma, M.S. OTR/L  11/13/22, 12:12 PM  ascom [phone]

## 2022-11-13 NOTE — Progress Notes (Signed)
[place] and gave report to Alexia Freestone RN  [phone]

## 2022-11-13 NOTE — Care Management Important Message (Signed)
Important Message  Patient Details  Name: [name] MRN: [id number] Date of Birth: [birth year]   Medicare Important Message Given:  Yes     [name] 11/13/2022, 12:44 PM

## 2022-11-13 NOTE — TOC Transition Note (Signed)
Transition of Care [facility]) - CM/SW Discharge Note   Patient Details  Name: [name] MRN: [id number] Date of Birth: 11-18-1925  [facility] of [facility]) CM/SW Contact:  Quin Hoop, LCSW Phone Number: 11/13/2022, 2:17 PM   Clinical Narrative:    Patient to go to [facility] for rehab.  Discharge summary completed.  EMS called and family notified of transport.  TOC signing off.   Final next level of care: [place] Barriers to Discharge: No Barriers Identified   Patient Goals and CMS Choice      Discharge Placement                Patient chooses bed at: [facility] Patient to be transferred to facility by: [facility] Name of family member notified: Dominica Severin, son, [phone] Patient and family notified of of transfer: 11/13/22  Discharge Plan and Services Additional resources added to the After Visit Summary for                                       Social Determinants of Health (SDOH) Interventions SDOH Screenings   Food Insecurity: No Food Insecurity (11/08/2022)  Housing: Low Risk  (11/08/2022)  Transportation Needs: No Transportation Needs (11/08/2022)  Utilities: Not At Risk (11/08/2022)  Depression (PHQ2-9): Low Risk  (10/03/2022)  Financial Resource Strain: Low Risk  (06/02/2019)  Physical Activity: Insufficiently Active (06/02/2019)  Social Connections: Unknown (06/02/2019)  Stress: No Stress Concern Present (06/02/2019)  Tobacco Use: Low Risk  (11/06/2022)     Readmission Risk Interventions     No data to display

## 2022-11-13 NOTE — Plan of Care (Signed)
Patient's NIF is -40 and FVC is 1.58

## 2022-11-14 DIAGNOSIS — S2243XA Multiple fractures of ribs, bilateral, initial encounter for closed fracture: Secondary | ICD-10-CM | POA: Diagnosis not present

## 2022-11-14 DIAGNOSIS — I10 Essential (primary) hypertension: Secondary | ICD-10-CM | POA: Diagnosis not present

## 2022-11-14 DIAGNOSIS — R338 Other retention of urine: Secondary | ICD-10-CM | POA: Diagnosis not present

## 2022-11-14 DIAGNOSIS — E871 Hypo-osmolality and hyponatremia: Secondary | ICD-10-CM | POA: Diagnosis not present

## 2022-11-14 DIAGNOSIS — E1129 Type 2 diabetes mellitus with other diabetic kidney complication: Secondary | ICD-10-CM | POA: Diagnosis not present

## 2022-11-14 DIAGNOSIS — D508 Other iron deficiency anemias: Secondary | ICD-10-CM | POA: Diagnosis not present

## 2022-11-14 DIAGNOSIS — D72829 Elevated white blood cell count, unspecified: Secondary | ICD-10-CM | POA: Diagnosis not present

## 2022-11-14 DIAGNOSIS — G7000 Myasthenia gravis without (acute) exacerbation: Secondary | ICD-10-CM | POA: Diagnosis not present

## 2022-11-14 DIAGNOSIS — M6281 Muscle weakness (generalized): Secondary | ICD-10-CM | POA: Diagnosis not present

## 2022-11-14 DIAGNOSIS — R296 Repeated falls: Secondary | ICD-10-CM | POA: Diagnosis not present

## 2022-11-15 DIAGNOSIS — I10 Essential (primary) hypertension: Secondary | ICD-10-CM | POA: Diagnosis not present

## 2022-11-15 DIAGNOSIS — D72829 Elevated white blood cell count, unspecified: Secondary | ICD-10-CM | POA: Diagnosis not present

## 2022-11-15 DIAGNOSIS — R338 Other retention of urine: Secondary | ICD-10-CM | POA: Diagnosis not present

## 2022-11-15 DIAGNOSIS — G7000 Myasthenia gravis without (acute) exacerbation: Secondary | ICD-10-CM | POA: Diagnosis not present

## 2022-11-15 DIAGNOSIS — E1129 Type 2 diabetes mellitus with other diabetic kidney complication: Secondary | ICD-10-CM | POA: Diagnosis not present

## 2022-11-15 DIAGNOSIS — D508 Other iron deficiency anemias: Secondary | ICD-10-CM | POA: Diagnosis not present

## 2022-11-15 DIAGNOSIS — M6281 Muscle weakness (generalized): Secondary | ICD-10-CM | POA: Diagnosis not present

## 2022-11-15 DIAGNOSIS — E871 Hypo-osmolality and hyponatremia: Secondary | ICD-10-CM | POA: Diagnosis not present

## 2022-11-15 DIAGNOSIS — S2243XA Multiple fractures of ribs, bilateral, initial encounter for closed fracture: Secondary | ICD-10-CM | POA: Diagnosis not present

## 2022-11-15 DIAGNOSIS — R296 Repeated falls: Secondary | ICD-10-CM | POA: Diagnosis not present

## 2022-11-16 ENCOUNTER — Other Ambulatory Visit: Payer: Self-pay | Admitting: *Deleted

## 2022-11-16 DIAGNOSIS — R2681 Unsteadiness on feet: Secondary | ICD-10-CM | POA: Diagnosis not present

## 2022-11-16 DIAGNOSIS — E118 Type 2 diabetes mellitus with unspecified complications: Secondary | ICD-10-CM | POA: Diagnosis not present

## 2022-11-16 DIAGNOSIS — G2581 Restless legs syndrome: Secondary | ICD-10-CM | POA: Diagnosis not present

## 2022-11-16 DIAGNOSIS — E785 Hyperlipidemia, unspecified: Secondary | ICD-10-CM | POA: Diagnosis not present

## 2022-11-16 DIAGNOSIS — N401 Enlarged prostate with lower urinary tract symptoms: Secondary | ICD-10-CM | POA: Diagnosis not present

## 2022-11-16 DIAGNOSIS — G7000 Myasthenia gravis without (acute) exacerbation: Secondary | ICD-10-CM | POA: Diagnosis not present

## 2022-11-16 DIAGNOSIS — I10 Essential (primary) hypertension: Secondary | ICD-10-CM | POA: Diagnosis not present

## 2022-11-16 DIAGNOSIS — M6281 Muscle weakness (generalized): Secondary | ICD-10-CM | POA: Diagnosis not present

## 2022-11-16 NOTE — Patient Outreach (Signed)
Verified in [facility] Mr. Cessna currently resides in [place] skilled nursing facility. Will follow for potential [facility] care coordination services as benefit of insurance plan and Primary Care Provider.   Marthenia Rolling, MSN, RN,BSN [place] Acute Care Coordinator [phone] (Direct dial)

## 2022-11-20 DIAGNOSIS — D508 Other iron deficiency anemias: Secondary | ICD-10-CM | POA: Diagnosis not present

## 2022-11-20 DIAGNOSIS — R2681 Unsteadiness on feet: Secondary | ICD-10-CM | POA: Diagnosis not present

## 2022-11-20 DIAGNOSIS — D72829 Elevated white blood cell count, unspecified: Secondary | ICD-10-CM | POA: Diagnosis not present

## 2022-11-20 DIAGNOSIS — N401 Enlarged prostate with lower urinary tract symptoms: Secondary | ICD-10-CM | POA: Diagnosis not present

## 2022-11-20 DIAGNOSIS — G7000 Myasthenia gravis without (acute) exacerbation: Secondary | ICD-10-CM | POA: Diagnosis not present

## 2022-11-20 DIAGNOSIS — I10 Essential (primary) hypertension: Secondary | ICD-10-CM | POA: Diagnosis not present

## 2022-11-20 DIAGNOSIS — R296 Repeated falls: Secondary | ICD-10-CM | POA: Diagnosis not present

## 2022-11-20 DIAGNOSIS — R338 Other retention of urine: Secondary | ICD-10-CM | POA: Diagnosis not present

## 2022-11-20 DIAGNOSIS — E785 Hyperlipidemia, unspecified: Secondary | ICD-10-CM | POA: Diagnosis not present

## 2022-11-20 DIAGNOSIS — E118 Type 2 diabetes mellitus with unspecified complications: Secondary | ICD-10-CM | POA: Diagnosis not present

## 2022-11-20 DIAGNOSIS — M6281 Muscle weakness (generalized): Secondary | ICD-10-CM | POA: Diagnosis not present

## 2022-11-20 DIAGNOSIS — E871 Hypo-osmolality and hyponatremia: Secondary | ICD-10-CM | POA: Diagnosis not present

## 2022-11-20 DIAGNOSIS — E1129 Type 2 diabetes mellitus with other diabetic kidney complication: Secondary | ICD-10-CM | POA: Diagnosis not present

## 2022-11-20 DIAGNOSIS — S2243XA Multiple fractures of ribs, bilateral, initial encounter for closed fracture: Secondary | ICD-10-CM | POA: Diagnosis not present

## 2022-11-20 DIAGNOSIS — G2581 Restless legs syndrome: Secondary | ICD-10-CM | POA: Diagnosis not present

## 2022-11-21 ENCOUNTER — Other Ambulatory Visit: Payer: Self-pay | Admitting: *Deleted

## 2022-11-21 DIAGNOSIS — G7000 Myasthenia gravis without (acute) exacerbation: Secondary | ICD-10-CM | POA: Diagnosis not present

## 2022-11-21 DIAGNOSIS — D508 Other iron deficiency anemias: Secondary | ICD-10-CM | POA: Diagnosis not present

## 2022-11-21 DIAGNOSIS — E1129 Type 2 diabetes mellitus with other diabetic kidney complication: Secondary | ICD-10-CM | POA: Diagnosis not present

## 2022-11-21 DIAGNOSIS — S2243XA Multiple fractures of ribs, bilateral, initial encounter for closed fracture: Secondary | ICD-10-CM | POA: Diagnosis not present

## 2022-11-21 DIAGNOSIS — D72829 Elevated white blood cell count, unspecified: Secondary | ICD-10-CM | POA: Diagnosis not present

## 2022-11-21 DIAGNOSIS — M6281 Muscle weakness (generalized): Secondary | ICD-10-CM | POA: Diagnosis not present

## 2022-11-21 DIAGNOSIS — E871 Hypo-osmolality and hyponatremia: Secondary | ICD-10-CM | POA: Diagnosis not present

## 2022-11-21 DIAGNOSIS — R338 Other retention of urine: Secondary | ICD-10-CM | POA: Diagnosis not present

## 2022-11-21 DIAGNOSIS — I10 Essential (primary) hypertension: Secondary | ICD-10-CM | POA: Diagnosis not present

## 2022-11-21 DIAGNOSIS — R296 Repeated falls: Secondary | ICD-10-CM | POA: Diagnosis not present

## 2022-11-21 NOTE — Patient Outreach (Signed)
Late entry 11/20/22. [name] resides in [place] skilled nursing facility. Screening for potential [facility] care coordination services as benefit of insurance plan and Primary Care Provider.   Update received from [place], [name] Education officer, museum. [name] transition plan is to return home.   Will continue to follow for potential [facility] care coordination services.   Justin Rolling, MSN, RN,BSN [place] Acute Care Coordinator [phone] (Direct dial)

## 2022-11-23 DIAGNOSIS — G7000 Myasthenia gravis without (acute) exacerbation: Secondary | ICD-10-CM | POA: Diagnosis not present

## 2022-11-23 DIAGNOSIS — R338 Other retention of urine: Secondary | ICD-10-CM | POA: Diagnosis not present

## 2022-11-23 DIAGNOSIS — I10 Essential (primary) hypertension: Secondary | ICD-10-CM | POA: Diagnosis not present

## 2022-11-23 DIAGNOSIS — M6281 Muscle weakness (generalized): Secondary | ICD-10-CM | POA: Diagnosis not present

## 2022-11-23 DIAGNOSIS — E118 Type 2 diabetes mellitus with unspecified complications: Secondary | ICD-10-CM | POA: Diagnosis not present

## 2022-11-23 DIAGNOSIS — E785 Hyperlipidemia, unspecified: Secondary | ICD-10-CM | POA: Diagnosis not present

## 2022-11-23 DIAGNOSIS — E871 Hypo-osmolality and hyponatremia: Secondary | ICD-10-CM | POA: Diagnosis not present

## 2022-11-23 DIAGNOSIS — D508 Other iron deficiency anemias: Secondary | ICD-10-CM | POA: Diagnosis not present

## 2022-11-23 DIAGNOSIS — D72829 Elevated white blood cell count, unspecified: Secondary | ICD-10-CM | POA: Diagnosis not present

## 2022-11-23 DIAGNOSIS — G2581 Restless legs syndrome: Secondary | ICD-10-CM | POA: Diagnosis not present

## 2022-11-23 DIAGNOSIS — S2243XA Multiple fractures of ribs, bilateral, initial encounter for closed fracture: Secondary | ICD-10-CM | POA: Diagnosis not present

## 2022-11-23 DIAGNOSIS — N401 Enlarged prostate with lower urinary tract symptoms: Secondary | ICD-10-CM | POA: Diagnosis not present

## 2022-11-23 DIAGNOSIS — E1129 Type 2 diabetes mellitus with other diabetic kidney complication: Secondary | ICD-10-CM | POA: Diagnosis not present

## 2022-11-23 DIAGNOSIS — R296 Repeated falls: Secondary | ICD-10-CM | POA: Diagnosis not present

## 2022-11-23 DIAGNOSIS — R2681 Unsteadiness on feet: Secondary | ICD-10-CM | POA: Diagnosis not present

## 2022-11-26 DIAGNOSIS — S2243XA Multiple fractures of ribs, bilateral, initial encounter for closed fracture: Secondary | ICD-10-CM | POA: Diagnosis not present

## 2022-11-26 DIAGNOSIS — G7000 Myasthenia gravis without (acute) exacerbation: Secondary | ICD-10-CM | POA: Diagnosis not present

## 2022-11-26 DIAGNOSIS — D72829 Elevated white blood cell count, unspecified: Secondary | ICD-10-CM | POA: Diagnosis not present

## 2022-11-26 DIAGNOSIS — R338 Other retention of urine: Secondary | ICD-10-CM | POA: Diagnosis not present

## 2022-11-26 DIAGNOSIS — E1129 Type 2 diabetes mellitus with other diabetic kidney complication: Secondary | ICD-10-CM | POA: Diagnosis not present

## 2022-11-26 DIAGNOSIS — D508 Other iron deficiency anemias: Secondary | ICD-10-CM | POA: Diagnosis not present

## 2022-11-26 DIAGNOSIS — R296 Repeated falls: Secondary | ICD-10-CM | POA: Diagnosis not present

## 2022-11-26 DIAGNOSIS — M6281 Muscle weakness (generalized): Secondary | ICD-10-CM | POA: Diagnosis not present

## 2022-11-26 DIAGNOSIS — E871 Hypo-osmolality and hyponatremia: Secondary | ICD-10-CM | POA: Diagnosis not present

## 2022-11-26 DIAGNOSIS — I10 Essential (primary) hypertension: Secondary | ICD-10-CM | POA: Diagnosis not present

## 2022-11-27 DIAGNOSIS — E785 Hyperlipidemia, unspecified: Secondary | ICD-10-CM | POA: Diagnosis not present

## 2022-11-27 DIAGNOSIS — G7000 Myasthenia gravis without (acute) exacerbation: Secondary | ICD-10-CM | POA: Diagnosis not present

## 2022-11-27 DIAGNOSIS — E118 Type 2 diabetes mellitus with unspecified complications: Secondary | ICD-10-CM | POA: Diagnosis not present

## 2022-11-27 DIAGNOSIS — R2681 Unsteadiness on feet: Secondary | ICD-10-CM | POA: Diagnosis not present

## 2022-11-27 DIAGNOSIS — N401 Enlarged prostate with lower urinary tract symptoms: Secondary | ICD-10-CM | POA: Diagnosis not present

## 2022-11-27 DIAGNOSIS — M6281 Muscle weakness (generalized): Secondary | ICD-10-CM | POA: Diagnosis not present

## 2022-11-27 DIAGNOSIS — I10 Essential (primary) hypertension: Secondary | ICD-10-CM | POA: Diagnosis not present

## 2022-11-27 DIAGNOSIS — G2581 Restless legs syndrome: Secondary | ICD-10-CM | POA: Diagnosis not present

## 2022-11-28 DIAGNOSIS — E1129 Type 2 diabetes mellitus with other diabetic kidney complication: Secondary | ICD-10-CM | POA: Diagnosis not present

## 2022-11-28 DIAGNOSIS — I10 Essential (primary) hypertension: Secondary | ICD-10-CM | POA: Diagnosis not present

## 2022-11-28 DIAGNOSIS — R338 Other retention of urine: Secondary | ICD-10-CM | POA: Diagnosis not present

## 2022-11-28 DIAGNOSIS — M6281 Muscle weakness (generalized): Secondary | ICD-10-CM | POA: Diagnosis not present

## 2022-11-28 DIAGNOSIS — E871 Hypo-osmolality and hyponatremia: Secondary | ICD-10-CM | POA: Diagnosis not present

## 2022-11-28 DIAGNOSIS — D72829 Elevated white blood cell count, unspecified: Secondary | ICD-10-CM | POA: Diagnosis not present

## 2022-11-28 DIAGNOSIS — D508 Other iron deficiency anemias: Secondary | ICD-10-CM | POA: Diagnosis not present

## 2022-11-28 DIAGNOSIS — S2243XA Multiple fractures of ribs, bilateral, initial encounter for closed fracture: Secondary | ICD-10-CM | POA: Diagnosis not present

## 2022-11-28 DIAGNOSIS — R296 Repeated falls: Secondary | ICD-10-CM | POA: Diagnosis not present

## 2022-11-28 DIAGNOSIS — G7000 Myasthenia gravis without (acute) exacerbation: Secondary | ICD-10-CM | POA: Diagnosis not present

## 2022-11-30 DIAGNOSIS — N401 Enlarged prostate with lower urinary tract symptoms: Secondary | ICD-10-CM | POA: Diagnosis not present

## 2022-11-30 DIAGNOSIS — M6281 Muscle weakness (generalized): Secondary | ICD-10-CM | POA: Diagnosis not present

## 2022-11-30 DIAGNOSIS — G7000 Myasthenia gravis without (acute) exacerbation: Secondary | ICD-10-CM | POA: Diagnosis not present

## 2022-11-30 DIAGNOSIS — I10 Essential (primary) hypertension: Secondary | ICD-10-CM | POA: Diagnosis not present

## 2022-11-30 DIAGNOSIS — E118 Type 2 diabetes mellitus with unspecified complications: Secondary | ICD-10-CM | POA: Diagnosis not present

## 2022-11-30 DIAGNOSIS — G2581 Restless legs syndrome: Secondary | ICD-10-CM | POA: Diagnosis not present

## 2022-11-30 DIAGNOSIS — R2681 Unsteadiness on feet: Secondary | ICD-10-CM | POA: Diagnosis not present

## 2022-11-30 DIAGNOSIS — E785 Hyperlipidemia, unspecified: Secondary | ICD-10-CM | POA: Diagnosis not present

## 2022-12-01 DIAGNOSIS — G459 Transient cerebral ischemic attack, unspecified: Secondary | ICD-10-CM | POA: Diagnosis not present

## 2022-12-01 DIAGNOSIS — C801 Malignant (primary) neoplasm, unspecified: Secondary | ICD-10-CM | POA: Diagnosis not present

## 2022-12-03 DIAGNOSIS — D508 Other iron deficiency anemias: Secondary | ICD-10-CM | POA: Diagnosis not present

## 2022-12-03 DIAGNOSIS — I10 Essential (primary) hypertension: Secondary | ICD-10-CM | POA: Diagnosis not present

## 2022-12-03 DIAGNOSIS — G7000 Myasthenia gravis without (acute) exacerbation: Secondary | ICD-10-CM | POA: Diagnosis not present

## 2022-12-03 DIAGNOSIS — S2243XA Multiple fractures of ribs, bilateral, initial encounter for closed fracture: Secondary | ICD-10-CM | POA: Diagnosis not present

## 2022-12-03 DIAGNOSIS — R296 Repeated falls: Secondary | ICD-10-CM | POA: Diagnosis not present

## 2022-12-03 DIAGNOSIS — R338 Other retention of urine: Secondary | ICD-10-CM | POA: Diagnosis not present

## 2022-12-03 DIAGNOSIS — M6281 Muscle weakness (generalized): Secondary | ICD-10-CM | POA: Diagnosis not present

## 2022-12-03 DIAGNOSIS — E1129 Type 2 diabetes mellitus with other diabetic kidney complication: Secondary | ICD-10-CM | POA: Diagnosis not present

## 2022-12-03 DIAGNOSIS — R300 Dysuria: Secondary | ICD-10-CM | POA: Diagnosis not present

## 2022-12-03 DIAGNOSIS — D72829 Elevated white blood cell count, unspecified: Secondary | ICD-10-CM | POA: Diagnosis not present

## 2022-12-03 DIAGNOSIS — E871 Hypo-osmolality and hyponatremia: Secondary | ICD-10-CM | POA: Diagnosis not present

## 2022-12-05 ENCOUNTER — Other Ambulatory Visit: Payer: Medicare Other

## 2022-12-06 ENCOUNTER — Telehealth: Payer: Self-pay | Admitting: *Deleted

## 2022-12-06 ENCOUNTER — Other Ambulatory Visit: Payer: Self-pay | Admitting: *Deleted

## 2022-12-06 DIAGNOSIS — M1990 Unspecified osteoarthritis, unspecified site: Secondary | ICD-10-CM | POA: Diagnosis not present

## 2022-12-06 DIAGNOSIS — Z9181 History of falling: Secondary | ICD-10-CM | POA: Diagnosis not present

## 2022-12-06 DIAGNOSIS — E119 Type 2 diabetes mellitus without complications: Secondary | ICD-10-CM | POA: Diagnosis not present

## 2022-12-06 DIAGNOSIS — I10 Essential (primary) hypertension: Secondary | ICD-10-CM

## 2022-12-06 DIAGNOSIS — D508 Other iron deficiency anemias: Secondary | ICD-10-CM | POA: Diagnosis not present

## 2022-12-06 DIAGNOSIS — E7800 Pure hypercholesterolemia, unspecified: Secondary | ICD-10-CM | POA: Diagnosis not present

## 2022-12-06 DIAGNOSIS — I110 Hypertensive heart disease with heart failure: Secondary | ICD-10-CM | POA: Diagnosis not present

## 2022-12-06 DIAGNOSIS — Z7982 Long term (current) use of aspirin: Secondary | ICD-10-CM | POA: Diagnosis not present

## 2022-12-06 DIAGNOSIS — E46 Unspecified protein-calorie malnutrition: Secondary | ICD-10-CM | POA: Diagnosis not present

## 2022-12-06 DIAGNOSIS — Z6827 Body mass index (BMI) 27.0-27.9, adult: Secondary | ICD-10-CM | POA: Diagnosis not present

## 2022-12-06 DIAGNOSIS — Z7984 Long term (current) use of oral hypoglycemic drugs: Secondary | ICD-10-CM | POA: Diagnosis not present

## 2022-12-06 DIAGNOSIS — Z96649 Presence of unspecified artificial hip joint: Secondary | ICD-10-CM | POA: Diagnosis not present

## 2022-12-06 DIAGNOSIS — I5030 Unspecified diastolic (congestive) heart failure: Secondary | ICD-10-CM | POA: Diagnosis not present

## 2022-12-06 DIAGNOSIS — E871 Hypo-osmolality and hyponatremia: Secondary | ICD-10-CM | POA: Diagnosis not present

## 2022-12-06 DIAGNOSIS — N401 Enlarged prostate with lower urinary tract symptoms: Secondary | ICD-10-CM | POA: Diagnosis not present

## 2022-12-06 DIAGNOSIS — E559 Vitamin D deficiency, unspecified: Secondary | ICD-10-CM | POA: Diagnosis not present

## 2022-12-06 DIAGNOSIS — Z8673 Personal history of transient ischemic attack (TIA), and cerebral infarction without residual deficits: Secondary | ICD-10-CM | POA: Diagnosis not present

## 2022-12-06 DIAGNOSIS — G2581 Restless legs syndrome: Secondary | ICD-10-CM | POA: Diagnosis not present

## 2022-12-06 DIAGNOSIS — M21371 Foot drop, right foot: Secondary | ICD-10-CM | POA: Diagnosis not present

## 2022-12-06 DIAGNOSIS — R338 Other retention of urine: Secondary | ICD-10-CM | POA: Diagnosis not present

## 2022-12-06 DIAGNOSIS — S2231XD Fracture of one rib, right side, subsequent encounter for fracture with routine healing: Secondary | ICD-10-CM | POA: Diagnosis not present

## 2022-12-06 DIAGNOSIS — Z85828 Personal history of other malignant neoplasm of skin: Secondary | ICD-10-CM | POA: Diagnosis not present

## 2022-12-06 DIAGNOSIS — K219 Gastro-esophageal reflux disease without esophagitis: Secondary | ICD-10-CM | POA: Diagnosis not present

## 2022-12-06 DIAGNOSIS — G7000 Myasthenia gravis without (acute) exacerbation: Secondary | ICD-10-CM | POA: Diagnosis not present

## 2022-12-06 DIAGNOSIS — S2232XD Fracture of one rib, left side, subsequent encounter for fracture with routine healing: Secondary | ICD-10-CM | POA: Diagnosis not present

## 2022-12-06 NOTE — Patient Outreach (Signed)
Per [facility] [name] discharged from [facility] on 12/04/22. Screening for potential [facility] care coordination services as benefit of health plan and Primary Care Provider.   Spoke with [place], Armed forces technical officer. [name] discharged from [facility] to home with [place] home health. Lives alone.   Appears per EPIC, PCP follow up is scheduled for 12/12/22.  Making referral to [facility] care coordination team.   Marthenia Rolling, MSN, RN,BSN [place] Acute Care Coordinator [phone] (Direct dial)

## 2022-12-06 NOTE — Progress Notes (Signed)
  Care Coordination   Note   12/06/2022 Name: [name] MRN: [id number] DOB: May 05, 1926  [name] is a 89 y.o. year old male who sees Einar Pheasant, MD for primary care. I reached out to [name] by phone today to offer care coordination services.  [name] was given information about Care Coordination services today including:   The Care Coordination services include support from the care team which includes your Nurse Coordinator, Clinical Social Worker, or Pharmacist.  The Care Coordination team is here to help remove barriers to the health concerns and goals most important to you. Care Coordination services are voluntary, and the patient may decline or stop services at any time by request to their care team member.   Care Coordination Consent Status: Patient agreed to services and verbal consent obtained.   Follow up plan:  Telephone appointment with care coordination team member scheduled for:  12/17/2022 - pt preferred an appointment after seeing Dr Nicki Reaper   Encounter Outcome:  Pt. Scheduled from referral   Julian Hy, [place] Direct Dial: [phone]

## 2022-12-07 DIAGNOSIS — G7000 Myasthenia gravis without (acute) exacerbation: Secondary | ICD-10-CM | POA: Diagnosis not present

## 2022-12-07 DIAGNOSIS — I5030 Unspecified diastolic (congestive) heart failure: Secondary | ICD-10-CM | POA: Diagnosis not present

## 2022-12-07 DIAGNOSIS — S2232XD Fracture of one rib, left side, subsequent encounter for fracture with routine healing: Secondary | ICD-10-CM | POA: Diagnosis not present

## 2022-12-07 DIAGNOSIS — S2231XD Fracture of one rib, right side, subsequent encounter for fracture with routine healing: Secondary | ICD-10-CM | POA: Diagnosis not present

## 2022-12-07 DIAGNOSIS — I110 Hypertensive heart disease with heart failure: Secondary | ICD-10-CM | POA: Diagnosis not present

## 2022-12-07 DIAGNOSIS — E119 Type 2 diabetes mellitus without complications: Secondary | ICD-10-CM | POA: Diagnosis not present

## 2022-12-11 DIAGNOSIS — E119 Type 2 diabetes mellitus without complications: Secondary | ICD-10-CM | POA: Diagnosis not present

## 2022-12-11 DIAGNOSIS — S2231XD Fracture of one rib, right side, subsequent encounter for fracture with routine healing: Secondary | ICD-10-CM | POA: Diagnosis not present

## 2022-12-11 DIAGNOSIS — G7000 Myasthenia gravis without (acute) exacerbation: Secondary | ICD-10-CM | POA: Diagnosis not present

## 2022-12-11 DIAGNOSIS — I110 Hypertensive heart disease with heart failure: Secondary | ICD-10-CM | POA: Diagnosis not present

## 2022-12-11 DIAGNOSIS — S2232XD Fracture of one rib, left side, subsequent encounter for fracture with routine healing: Secondary | ICD-10-CM | POA: Diagnosis not present

## 2022-12-11 DIAGNOSIS — I5030 Unspecified diastolic (congestive) heart failure: Secondary | ICD-10-CM | POA: Diagnosis not present

## 2022-12-12 ENCOUNTER — Encounter: Payer: Self-pay | Admitting: Internal Medicine

## 2022-12-12 ENCOUNTER — Ambulatory Visit (INDEPENDENT_AMBULATORY_CARE_PROVIDER_SITE_OTHER): Payer: Medicare Other | Admitting: Internal Medicine

## 2022-12-12 VITALS — BP 120/70 | HR 80 | Temp 97.8°F | Resp 16 | Ht 70.0 in | Wt 186.0 lb

## 2022-12-12 DIAGNOSIS — D649 Anemia, unspecified: Secondary | ICD-10-CM

## 2022-12-12 DIAGNOSIS — S2243XA Multiple fractures of ribs, bilateral, initial encounter for closed fracture: Secondary | ICD-10-CM

## 2022-12-12 DIAGNOSIS — E7800 Pure hypercholesterolemia, unspecified: Secondary | ICD-10-CM | POA: Diagnosis not present

## 2022-12-12 DIAGNOSIS — E1165 Type 2 diabetes mellitus with hyperglycemia: Secondary | ICD-10-CM | POA: Diagnosis not present

## 2022-12-12 DIAGNOSIS — I10 Essential (primary) hypertension: Secondary | ICD-10-CM

## 2022-12-12 DIAGNOSIS — G7000 Myasthenia gravis without (acute) exacerbation: Secondary | ICD-10-CM | POA: Diagnosis not present

## 2022-12-12 DIAGNOSIS — K819 Cholecystitis, unspecified: Secondary | ICD-10-CM

## 2022-12-12 DIAGNOSIS — M21379 Foot drop, unspecified foot: Secondary | ICD-10-CM

## 2022-12-12 DIAGNOSIS — E538 Deficiency of other specified B group vitamins: Secondary | ICD-10-CM | POA: Diagnosis not present

## 2022-12-12 DIAGNOSIS — R531 Weakness: Secondary | ICD-10-CM

## 2022-12-12 DIAGNOSIS — E871 Hypo-osmolality and hyponatremia: Secondary | ICD-10-CM

## 2022-12-12 LAB — HEPATIC FUNCTION PANEL
ALT: 11 U/L (ref 0–53)
AST: 16 U/L (ref 0–37)
Albumin: 3.8 g/dL (ref 3.5–5.2)
Alkaline Phosphatase: 45 U/L (ref 39–117)
Bilirubin, Direct: 0.2 mg/dL (ref 0.0–0.3)
Total Bilirubin: 0.8 mg/dL (ref 0.2–1.2)
Total Protein: 7.2 g/dL (ref 6.0–8.3)

## 2022-12-12 LAB — CBC WITH DIFFERENTIAL/PLATELET
Basophils Absolute: 0.1 10*3/uL (ref 0.0–0.1)
Basophils Relative: 0.8 % (ref 0.0–3.0)
Eosinophils Absolute: 0.1 10*3/uL (ref 0.0–0.7)
Eosinophils Relative: 1.4 % (ref 0.0–5.0)
HCT: 32.6 % — ABNORMAL LOW (ref 39.0–52.0)
Hemoglobin: 10.9 g/dL — ABNORMAL LOW (ref 13.0–17.0)
Lymphocytes Relative: 14.5 % (ref 12.0–46.0)
Lymphs Abs: 1.2 10*3/uL (ref 0.7–4.0)
MCHC: 33.4 g/dL (ref 30.0–36.0)
MCV: 91.3 fl (ref 78.0–100.0)
Monocytes Absolute: 0.6 10*3/uL (ref 0.1–1.0)
Monocytes Relative: 7 % (ref 3.0–12.0)
Neutro Abs: 6.3 10*3/uL (ref 1.4–7.7)
Neutrophils Relative %: 76.3 % (ref 43.0–77.0)
Platelets: 357 10*3/uL (ref 150.0–400.0)
RBC: 3.57 Mil/uL — ABNORMAL LOW (ref 4.22–5.81)
RDW: 13.5 % (ref 11.5–15.5)
WBC: 8.3 10*3/uL (ref 4.0–10.5)

## 2022-12-12 LAB — BASIC METABOLIC PANEL
BUN: 14 mg/dL (ref 6–23)
CO2: 24 mEq/L (ref 19–32)
Calcium: 8.9 mg/dL (ref 8.4–10.5)
Chloride: 100 mEq/L (ref 96–112)
Creatinine, Ser: 0.7 mg/dL (ref 0.40–1.50)
GFR: 79.04 mL/min (ref >60.00)
Glucose, Bld: 115 mg/dL — ABNORMAL HIGH (ref 70–99)
Potassium: 4.4 mEq/L (ref 3.5–5.1)
Sodium: 137 mEq/L (ref 135–145)

## 2022-12-12 LAB — HEMOGLOBIN A1C: Hgb A1c MFr Bld: 6.6 % — ABNORMAL HIGH (ref 4.6–6.5)

## 2022-12-12 LAB — TSH: TSH: 3.25 u[IU]/mL (ref 0.35–5.50)

## 2022-12-12 MED ORDER — CYANOCOBALAMIN 1000 MCG/ML IJ SOLN
1000.0000 ug | Freq: Once | INTRAMUSCULAR | Status: AC
  Administered 2022-12-12: 1000 ug via INTRAMUSCULAR

## 2022-12-12 NOTE — Progress Notes (Addendum)
Subjective:    Patient ID: [name], male    DOB: [birth year], 89 y.o.   MRN: [id number]  Patient here for  Chief Complaint  Patient presents with   Medical Management of Chronic Issues    HPI Here to follow up - recent hospitalization - 11/06/22 - 11/13/22.  Hospitalized after presenting with generalized weakness.  He is accompanied by his daughter-n-law Serena Colonel).  History obtained from both of them.  Sodium 124.  Started sodium chloride tablets. ECHO - EF 55-60% with grade 1 DD.  F/u cxr - pleural effusion and pulmonary edema.  Lasix increased.  Discharged on oral lasix.  CTA chest  - negative PE.  Gallstone present.  Surgery consulted.  HIDA. Recommended no further intervention. Found to have rib fracture - noted on CT.  Recommended continuing incentive spirometer.  Found to have vitamin D deficiency and folate deficiency.  Discharged to SNF.  Discharged from rehab on 12/04/22.  Discussed home health.  Nurse came out last week.  OT yesterday.  Is feeling better.  Getting stronger.  Eating.  No nausea or vomiting.  Breathing stable.  Is using incentive spirometer.  Reports increased urinary frequency.     Past Medical History:  Diagnosis Date   Actinic keratosis 02/01/2021   midline anterior chin    Allergic rhinitis    Basal cell carcinoma 01/29/2007   Right mid forehead. Excised: 03/24/2007, margins free.   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 Excised 07/27/2014, residual focus AFX, margins  free.   Squamous cell carcinoma of skin 04/01/2007   Right forehead, 2cm above mid brow. WD SCC wtih superficial infiltration. Excised: 05/14/2007, margins free   Squamous cell carcinoma of skin 05/16/2016   Crown. WD SCC, ulcerated. Excised: 07/03/2016, residual MD SCC, deep margin involved. Excised: 07/17/2016, margins free.   TIA (transient ischemic attack) 09/13/2015   Urinary outflow obstruction    mild   Past Surgical History:  Procedure Laterality Date   PARTIAL HIP ARTHROPLASTY  01/2019   PILONIDAL CYST EXCISION  1951   removal   ROTATOR CUFF REPAIR  1995   SKIN CANCER EXCISION     multiple   SQUAMOUS CELL CARCINOMA EXCISION     behind head   TONSILLECTOMY AND ADENOIDECTOMY  1938   Family History  Problem Relation Age of Onset   Heart disease Father        heart atack   Alzheimer's disease Mother    CVA Brother    Social History   Socioeconomic History   Marital status: Widowed    Spouse name: Not on file   Number of children: Not on file   Years of education: Not on file   Highest education level: Not on file  Occupational History   Not on file  Tobacco Use   Smoking status: Never   Smokeless tobacco: Never  Vaping Use   Vaping Use: Never used  Substance and Sexual Activity   Alcohol  use: No    Alcohol/week: 0.0 standard drinks of alcohol   Drug use: No   Sexual activity: Not Currently    Birth control/protection: None  Other Topics Concern   Not on file  Social History Narrative   Retired, married. Walks everyday         Social Determinants of Health   Financial Resource Strain: Low Risk  (06/02/2019)   Overall Financial Resource Strain (CARDIA)    Difficulty of Paying Living Expenses: Not hard at all  Food Insecurity: No Food Insecurity (11/08/2022)   Hunger Vital Sign    Worried About Running Out of Food in the Last Year: Never true    Ran Out of Food in the Last Year: Never true  Transportation Needs: No Transportation Needs (11/08/2022)   PRAPARE  - Hydrologist (Medical): No    Lack of Transportation (Non-Medical): No  Physical Activity: Insufficiently Active (06/02/2019)   Exercise Vital Sign    Days of Exercise per Week: 2 days    Minutes of Exercise per Session: 60 min  Stress: No Stress Concern Present (06/02/2019)   [place]    Feeling of Stress : Not at all  Social Connections: Unknown (06/02/2019)   Social Connection and Isolation Panel [NHANES]    Frequency of Communication with Friends and Family: Not asked    Frequency of Social Gatherings with Friends and Family: Not on file    Attends Religious Services: Not on file    Active Member of Clubs or Organizations: Not on file    Attends Archivist Meetings: Not on file    Marital Status: Not on file     Review of Systems  Constitutional:  Negative for appetite change and unexpected weight change.  HENT:  Negative for congestion and sinus pressure.   Respiratory:  Negative for cough and chest tightness.        Breathing stable.   Cardiovascular:  Negative for chest pain and palpitations.       No increased swelling.   Gastrointestinal:  Negative for abdominal pain, diarrhea, nausea and vomiting.  Genitourinary:  Negative for difficulty urinating and dysuria.  Musculoskeletal:  Negative for joint swelling and myalgias.  Skin:  Negative for color change and rash.  Neurological:  Negative for dizziness and headaches.  Psychiatric/Behavioral:  Negative for agitation and dysphoric mood.        Objective:     BP 120/70   Pulse 80   Temp 97.8 F (36.6 C)   Resp 16   Ht 5' 10"$  (1.778 m)   Wt 186 lb (84.4 kg)   SpO2 98%   BMI 26.69 kg/m  Wt Readings from Last 3 Encounters:  12/12/22 186 lb (84.4 kg)  11/06/22 205 lb 0.4 oz (93 kg)  10/10/22 206 lb (93.4 kg)    Physical Exam Constitutional:      General: He is not in acute distress.    Appearance:  Normal appearance. He is well-developed.  HENT:     Head: Normocephalic and atraumatic.     Right Ear: External ear normal.     Left Ear: External ear normal.  Eyes:     General: No scleral icterus.       Right eye: No discharge.        Left eye: No discharge.  Cardiovascular:     Rate and Rhythm: Normal rate and regular rhythm.  Pulmonary:  Effort: Pulmonary effort is normal. No respiratory distress.     Breath sounds: Normal breath sounds.  Abdominal:     General: Bowel sounds are normal.     Palpations: Abdomen is soft.     Tenderness: There is no abdominal tenderness.  Musculoskeletal:        General: No swelling or tenderness.     Cervical back: Neck supple. No tenderness.  Lymphadenopathy:     Cervical: No cervical adenopathy.  Skin:    Findings: No erythema or rash.  Neurological:     Mental Status: He is alert.  Psychiatric:        Mood and Affect: Mood normal.        Behavior: Behavior normal.      Outpatient Encounter Medications as of 12/12/2022  Medication Sig   Accu-Chek FastClix Lancets MISC USE 1  TO CHECK SUGARS ONCE DAILY. Dx E11.9   amLODipine (NORVASC) 10 MG tablet Take 1 tablet (10 mg total) by mouth daily.   aspirin 81 MG EC tablet Take 81 mg by mouth daily.   calcium-vitamin D (OSCAL WITH D) 500-200 MG-UNIT tablet Take 1 tablet by mouth 2 (two) times daily.   ferrous sulfate 325 (65 FE) MG EC tablet Take 325 mg by mouth 3 (three) times a week. Takes three times weekly. Monday Weds, Friday   finasteride (PROSCAR) 5 MG tablet Take 1 tablet by mouth once daily   folic acid (FOLVITE) 1 MG tablet Take 1 tablet (1 mg total) by mouth daily.   furosemide (LASIX) 40 MG tablet Take 1 tablet (40 mg total) by mouth daily.   gabapentin (NEURONTIN) 100 MG capsule Take 1 capsule (100 mg total) by mouth at bedtime.   glucose blood (ACCU-CHEK GUIDE) test strip USE 1 STRIP TO CHECK GLUCOSE ONCE DAILY DX E 11.9   lovastatin (MEVACOR) 20 MG tablet Take 1 tablet (20 mg  total) by mouth at bedtime.   metFORMIN (GLUCOPHAGE-XR) 500 MG 24 hr tablet Take 1 tablet by mouth once daily with breakfast   mometasone (ELOCON) 0.1 % cream Apply to rash on abdomen once or twice a day prn   mycophenolate (CELLCEPT) 500 MG tablet Take 1,000 mg by mouth 2 (two) times daily.   olmesartan (BENICAR) 40 MG tablet Take 1 tablet by mouth once daily   potassium chloride SA (KLOR-CON M) 20 MEQ tablet Take 1 tablet (20 mEq total) by mouth daily.   [EXPIRED] sodium chloride 1 g tablet Take 1 tablet (1 g total) by mouth 2 (two) times daily with a meal.   tamsulosin (FLOMAX) 0.4 MG CAPS capsule TAKE 1 CAPSULE BY MOUTH ONCE DAILY AFTER  SUPPER (Patient taking differently: 0.4 mg daily after breakfast. TAKE 1 CAPSULE BY MOUTH ONCE DAILY AFTER  SUPPER)   Vitamin D, Ergocalciferol, (DRISDOL) 1.25 MG (50000 UNIT) CAPS capsule Take 1 capsule (50,000 Units total) by mouth every 7 (seven) days.   [EXPIRED] cyanocobalamin (VITAMIN B12) injection 1,000 mcg    No facility-administered encounter medications on file as of 12/12/2022.    Medications reviewed with family present.   Lab Results  Component Value Date   WBC 8.3 12/12/2022   HGB 10.9 (L) 12/12/2022   HCT 32.6 (L) 12/12/2022   PLT 357.0 12/12/2022   GLUCOSE 115 (H) 12/12/2022   CHOL 112 08/23/2022   TRIG 124.0 08/23/2022   HDL 45.50 08/23/2022   LDLCALC 42 08/23/2022   ALT 11 12/12/2022   AST 16 12/12/2022   NA 137 12/12/2022  K 4.4 12/12/2022   CL 100 12/12/2022   CREATININE 0.70 12/12/2022   BUN 14 12/12/2022   CO2 24 12/12/2022   TSH 3.25 12/12/2022   PSA 0.02 (L) 04/16/2018   HGBA1C 6.6 (H) 12/12/2022   MICROALBUR 11.4 (H) 05/18/2022    ECHOCARDIOGRAM COMPLETE  Result Date: 11/08/2022    ECHOCARDIOGRAM REPORT   Patient Name:   HOLTON HEMANI Date of Exam: 11/08/2022 Medical Rec #:  [id number]           Height:       70.0 in Accession #:    [id number]          Weight:       205.0 lb Date of Birth:  06-06-1926           BSA:          2.109 m Patient Age:    26 years            BP:           132/62 mmHg Patient Gender: M                   HR:           82 bpm. Exam Location:  [facility] Procedure: 2D Echo, Cardiac Doppler and Color Doppler Indications:     CHF  History:         Patient has prior history of Echocardiogram examinations, most                  recent 08/22/2020. TIA and Stroke, Signs/Symptoms:Fatigue and                  Shortness of Breath; Risk Factors:Hypertension and Diabetes.  Sonographer:     Wenda Low Referring Phys:  [id number] Val Riles Diagnosing Phys: Ida Rogue MD IMPRESSIONS  1. Left ventricular ejection fraction, by estimation, is 55 to 60%. The left ventricle has normal function. The left ventricle has no regional wall motion abnormalities. Left ventricular diastolic parameters are indeterminate (TDI measurements concerning for grade II diastolic dysfunction).  2. Right ventricular systolic function is normal. The right ventricular size is normal. There is mildly elevated pulmonary artery systolic pressure. The estimated right ventricular systolic pressure is Q000111Q mmHg.  3. The mitral valve is normal in structure. Mild mitral valve regurgitation. No evidence of mitral stenosis.  4. The aortic valve is normal in structure. Aortic valve regurgitation is not visualized. Aortic valve sclerosis is present, with no evidence of aortic valve stenosis.  5. The inferior vena cava is normal in size with greater than 50% respiratory variability, suggesting right atrial pressure of 3 mmHg. FINDINGS  Left Ventricle: Left ventricular ejection fraction, by estimation, is 55 to 60%. The left ventricle has normal function. The left ventricle has no regional wall motion abnormalities. The left ventricular internal cavity size was normal in size. There is  no left ventricular hypertrophy. Left ventricular diastolic parameters are indeterminate. Right Ventricle: The right ventricular size is normal. No increase in right  ventricular wall thickness. Right ventricular systolic function is normal. There is mildly elevated pulmonary artery systolic pressure. The tricuspid regurgitant velocity is 2.92  m/s, and with an assumed right atrial pressure of 3 mmHg, the estimated right ventricular systolic pressure is Q000111Q mmHg. Left Atrium: Left atrial size was normal in size. Right Atrium: Right atrial size was normal in size. Pericardium: There is no evidence of pericardial effusion. Mitral Valve: The mitral valve is normal in  structure. Mild mitral valve regurgitation. No evidence of mitral valve stenosis. MV peak gradient, 4.3 mmHg. The mean mitral valve gradient is 2.0 mmHg. Tricuspid Valve: The tricuspid valve is normal in structure. Tricuspid valve regurgitation is mild . No evidence of tricuspid stenosis. Aortic Valve: The aortic valve is normal in structure. Aortic valve regurgitation is not visualized. Aortic valve sclerosis is present, with no evidence of aortic valve stenosis. Aortic valve mean gradient measures 4.0 mmHg. Aortic valve peak gradient measures 9.1 mmHg. Aortic valve area, by VTI measures 2.75 cm. Pulmonic Valve: The pulmonic valve was normal in structure. Pulmonic valve regurgitation is not visualized. No evidence of pulmonic stenosis. Aorta: The aortic root is normal in size and structure. Venous: The inferior vena cava is normal in size with greater than 50% respiratory variability, suggesting right atrial pressure of 3 mmHg. IAS/Shunts: No atrial level shunt detected by color flow Doppler.  LEFT VENTRICLE PLAX 2D LVIDd:         4.40 cm   Diastology LVIDs:         2.40 cm   LV e' medial:    9.57 cm/s LV PW:         1.20 cm   LV E/e' medial:  11.7 LV IVS:        1.10 cm   LV e' lateral:   6.96 cm/s LVOT diam:     2.10 cm   LV E/e' lateral: 16.1 LV SV:         82 LV SV Index:   39 LVOT Area:     3.46 cm  RIGHT VENTRICLE RV Basal diam:  3.35 cm RV Mid diam:    3.10 cm RV S prime:     15.50 cm/s TAPSE (M-mode): 2.4 cm  LEFT ATRIUM             Index        RIGHT ATRIUM           Index LA diam:        3.90 cm 1.85 cm/m   RA Area:     18.20 cm LA Vol (A2C):   50.2 ml 23.80 ml/m  RA Volume:   49.30 ml  23.37 ml/m LA Vol (A4C):   49.4 ml 23.42 ml/m LA Biplane Vol: 53.2 ml 25.22 ml/m  AORTIC VALVE                    PULMONIC VALVE AV Area (Vmax):    2.57 cm     PV Vmax:       1.30 m/s AV Area (Vmean):   2.82 cm     PV Peak grad:  6.8 mmHg AV Area (VTI):     2.75 cm AV Vmax:           151.00 cm/s AV Vmean:          94.200 cm/s AV VTI:            0.298 m AV Peak Grad:      9.1 mmHg AV Mean Grad:      4.0 mmHg LVOT Vmax:         112.00 cm/s LVOT Vmean:        76.800 cm/s LVOT VTI:          0.237 m LVOT/AV VTI ratio: 0.80  AORTA Ao Root diam: 3.70 cm MITRAL VALVE                TRICUSPID VALVE MV Area (PHT):  2.91 cm     TR Peak grad:   34.1 mmHg MV Area VTI:   1.99 cm     TR Vmax:        292.00 cm/s MV Peak grad:  4.3 mmHg MV Mean grad:  2.0 mmHg     SHUNTS MV Vmax:       1.04 m/s     Systemic VTI:  0.24 m MV Vmean:      70.2 cm/s    Systemic Diam: 2.10 cm MV Decel Time: 261 msec MV E velocity: 112.00 cm/s MV A velocity: 94.30 cm/s MV E/A ratio:  1.19 Ida Rogue MD Electronically signed by Ida Rogue MD Signature Date/Time: 11/08/2022/4:24:52 PM    Final    DG Chest Port 1 View  Result Date: 11/08/2022 CLINICAL DATA:  Shortness of breath, hypoxemia EXAM: PORTABLE CHEST 1 VIEW COMPARISON:  Portable exam 0816 hours compared to 11/06/2022 FINDINGS: Upper normal heart size. Pulmonary vascular congestion. Atherosclerotic calcification aorta. Central peribronchial thickening, chronic Bibasilar atelectasis greater on RIGHT with chronic elevation of RIGHT diaphragm. No definite acute infiltrate, pleural effusion, or pneumothorax. Bones demineralized. IMPRESSION: Chronic bronchitic changes with increased bibasilar atelectasis. Electronically Signed   By: Lavonia Dana M.D.   On: 11/08/2022 08:59       Assessment & Plan:  Type  2 diabetes mellitus with hyperglycemia, without long-term current use of insulin (HCC) Assessment & Plan: Low carb diet and exercise.  Follow met b and a1c.    Orders: -     Hemoglobin A1c  Hypercholesterolemia Assessment & Plan: On lovastatin.  Low cholesterol diet and exercise.  Follow lipid panel and liver function tests.    Orders: -     CBC with Differential/Platelet -     Hepatic function panel -     TSH  Hyponatremia Assessment & Plan: Follow sodium to confirm stable.  Recheck today.    Orders: -     Basic metabolic panel  HYPERTENSION, BENIGN Assessment & Plan: On amlodipine and benicar.  Follow pressures.  Follow metabolic panel.    B12 deficiency Assessment & Plan: B12 supplement.   Orders: -     Cyanocobalamin  Anemia, unspecified type Assessment & Plan: Follow cbc.    Cholecystitis Assessment & Plan:  CT chest in hospital to rule out PE revealed - Gallstone present.  Surgery consulted.  HIDA. Recommended no further intervention.    Folate deficiency Assessment & Plan: On folic acid.  Recheck rbc folate level 3-6 months.    Foot-drop, unspecified laterality Assessment & Plan: Foot drop.  He had a lumbar MRI in 07/2022 that showed more of an issue with L5 nerve root compression on the left actually and no significant compressive central canal stenosis and outside EMG was performed. Saw neurology.  Recommended repeat NCS.  Also PT referral - AFO recommendation and continued rehab.  Continue therapy.     Generalized weakness Assessment & Plan: Discharged to SNF.  Continue home therapy.  Is getting stronger.  Follow.    Myasthenia gravis [facility]) Assessment & Plan: Followed by neurology.  Stable.  Continue cellcept.  Follow cbc.    Closed fracture of multiple ribs of both sides, initial encounter Assessment & Plan: Noted on CT in hospital.  Continue incentive spirometer.       Einar Pheasant, MD

## 2022-12-14 ENCOUNTER — Encounter: Payer: Self-pay | Admitting: Internal Medicine

## 2022-12-14 NOTE — Telephone Encounter (Signed)
Just need to call urology office and see if can schedule an earlier appt with Dr Diamantina Providence - increased urinary frequency.

## 2022-12-16 ENCOUNTER — Encounter: Payer: Self-pay | Admitting: Internal Medicine

## 2022-12-16 NOTE — Assessment & Plan Note (Signed)
Noted on CT in hospital.  Continue incentive spirometer.

## 2022-12-16 NOTE — Assessment & Plan Note (Signed)
Follow cbc.  

## 2022-12-16 NOTE — Assessment & Plan Note (Signed)
Followed by neurology.  Stable.  Continue cellcept.  Follow cbc.

## 2022-12-16 NOTE — Assessment & Plan Note (Signed)
CT chest in hospital to rule out PE revealed - Gallstone present.  Surgery consulted.  HIDA. Recommended no further intervention.

## 2022-12-16 NOTE — Assessment & Plan Note (Signed)
Low carb diet and exercise.  Follow met b and a1c.   

## 2022-12-16 NOTE — Assessment & Plan Note (Signed)
On lovastatin.  Low cholesterol diet and exercise.  Follow lipid panel and liver function tests.

## 2022-12-16 NOTE — Assessment & Plan Note (Signed)
Discharged to SNF.  Continue home therapy.  Is getting stronger.  Follow.

## 2022-12-16 NOTE — Assessment & Plan Note (Signed)
Follow sodium to confirm stable.  Recheck today.

## 2022-12-16 NOTE — Assessment & Plan Note (Signed)
Foot drop.  He had a lumbar MRI in 07/2022 that showed more of an issue with L5 nerve root compression on the left actually and no significant compressive central canal stenosis and outside EMG was performed. Saw neurology.  Recommended repeat NCS.  Also PT referral - AFO recommendation and continued rehab.  Continue therapy.

## 2022-12-16 NOTE — Assessment & Plan Note (Signed)
B12 supplement.

## 2022-12-16 NOTE — Assessment & Plan Note (Signed)
On amlodipine and benicar.  Follow pressures.  Follow metabolic panel.

## 2022-12-16 NOTE — Assessment & Plan Note (Signed)
On folic acid.  Recheck rbc folate level 3-6 months.

## 2022-12-17 ENCOUNTER — Encounter: Payer: Self-pay | Admitting: Internal Medicine

## 2022-12-17 ENCOUNTER — Ambulatory Visit: Payer: Self-pay

## 2022-12-17 ENCOUNTER — Telehealth: Payer: Self-pay

## 2022-12-17 NOTE — Patient Outreach (Signed)
  Care Coordination   Initial Visit Note   12/17/2022 Name: [name] MRN: [id number] DOB: 1926/07/09  [name] is a 89 y.o. year old male who sees Justin Pheasant, MD for primary care. I spoke with  [name] by phone today.  What matters to the patients health and wellness today?  Patient states, "driving again, [name] home health getting orders from primary care provider and getting referral to urologist. "   Patient states he is doing well since discharged from the skilled nursing facility. He states he is regaining his strength and has a good appetite. He denies any falls.  Continues to ambulate with walker and wheelchair. Patient states he [facility] and they are now waiting for orders from his primary care provider. Patient states his doctor talked with him about a referral to the urologist. He states he is waiting for this to be done.     Goals Addressed             This Visit's Progress    Obtaining home health order and referral to urology       Interventions Today    Flowsheet Row Most Recent Value  Chronic Disease Discussed/Reviewed   Chronic disease discussed/reviewed during today's visit Hypertension (HTN), Other  [generalized weakness]  General Interventions   General Interventions Discussed/Reviewed General Interventions Reviewed, Doctor Visits  Doctor Visits Discussed/Reviewed Doctor Visits Discussed, PCP  PCP/Specialist Visits Contact provider for referral to  Contacted provider for referral to Specialist  Education Interventions   Education Provided Provided Printed Education, Provided Verbal Education  [fall safety]  Pharmacy Interventions   Pharmacy Dicussed/Reviewed Pharmacy Topics Reviewed  Safety Interventions   Safety Discussed/Reviewed Fall Risk      Care Coordination Interventions: Evaluation of current treatment plan related to recent hospitalization/ SNF rehab and patient's adherence to  plan as established by provider Reviewed medications with patient and discussed importance of compliance Reviewed scheduled/upcoming provider appointments Discussed plans with patient for ongoing care management follow up and provided patient with direct contact information for care management team Call to [facility] home health regarding request for home health orders.  Message sent to primary care provider requesting  orders be to [facility] home health.  Patient advised to notify provider for any new or ongoing symptoms.          SDOH assessments and interventions completed:  Yes  SDOH Interventions Today    Flowsheet Row Most Recent Value  SDOH Interventions   Food Insecurity Interventions Intervention Not Indicated  Housing Interventions Intervention Not Indicated  Transportation Interventions Intervention Not Indicated        Care Coordination Interventions:  Yes, provided   Follow up plan: Follow up call scheduled for 01/16/23    Encounter Outcome:  Pt. Visit Completed   Justin Plowman RN,BSN,CCM [place] [phone] direct line

## 2022-12-17 NOTE — Telephone Encounter (Signed)
See other note

## 2022-12-17 NOTE — Patient Outreach (Signed)
  Care Coordination   Follow Up Visit Note   12/17/2022 Name: OLUWATIMILEYIN VIVIER MRN: [id number] DOB: [birth year]  MUZAMMIL BRUINS is a 89 y.o. year old male who sees Einar Pheasant, MD for primary care. I spoke with  [name] by phone today.  What matters to the patients health and wellness today?  Home health orders    Goals Addressed             This Visit's Progress    Obtaining home health order and referral to urology       Interventions Today    Flowsheet Row Most Recent Value  Chronic Disease   Chronic disease during today's visit Hypertension (HTN), Other  [generalized weakness]  General Interventions   General Interventions Discussed/Reviewed General Interventions Reviewed  [patient notified that PCP has signed home health orders and nurse to confirm order has been sent to [facility] home health]      Care Coordination Interventions: Patient called and notified response received from Dr. Nicki Reaper that home health orders have been signed and her nurse is confirming orders have been sent to [facility] home health.            SDOH assessments and interventions completed:  No     Care Coordination Interventions:  Yes, provided   Follow up plan:  as previously scheduled    Encounter Outcome:  Pt. Visit Completed   Quinn Plowman RN,BSN,CCM [place] [phone] direct line

## 2022-12-18 NOTE — Telephone Encounter (Signed)
Pt scheduled for 2/20 at 1pm w/ Zara Council for increased urinary frequency

## 2022-12-18 NOTE — Telephone Encounter (Signed)
Yes

## 2022-12-18 NOTE — Telephone Encounter (Signed)
Thank you.  Is pt aware?

## 2022-12-24 NOTE — Progress Notes (Unsigned)
12/25/2022 9:52 AM   [name] 10-11-1926 [id number]  Referring provider: Einar Pheasant, MD [id number] [place],  [place] [id number]  Urological history: 1. BPH with LU TS -I PSS *** - PVR *** - UA *** -tamsulosin 0.4 mg daily and finasteride 5 mg daily   2. Nocturia -Risk factors for nocturia: ?obstructive sleep apnea, hypertension, diabetes, arthritis and BPH -PVR ***    No chief complaint on file.   HPI: [name] is a 89 y.o. male with myasthenia gravis who presents today for an increase in urinary frequency.  I PSS ***  PVR ***  UA ***   PMH: Past Medical History:  Diagnosis Date   Actinic keratosis 02/01/2021   midline anterior chin    Allergic rhinitis    Basal cell carcinoma 01/29/2007   Right mid forehead. Excised: 03/24/2007, margins free.   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 Excised 07/27/2014, residual focus AFX, margins free.   Squamous cell carcinoma of skin 04/01/2007   Right forehead, 2cm above mid brow. WD SCC wtih superficial infiltration. Excised: 05/14/2007, margins free   Squamous cell carcinoma of skin 05/16/2016   Crown. WD SCC, ulcerated. Excised: 07/03/2016, residual MD SCC, deep margin involved. Excised: 07/17/2016, margins free.   TIA (transient ischemic attack) 09/13/2015   Urinary outflow obstruction    mild    Surgical History: Past Surgical History:  Procedure Laterality Date   CATARACT  EXTRACTION Bilateral    PARTIAL HIP ARTHROPLASTY  01/2019   PILONIDAL CYST EXCISION  1951   removal   ROTATOR CUFF REPAIR  1995   SKIN CANCER EXCISION     multiple   SQUAMOUS CELL CARCINOMA EXCISION     behind head   TONSILLECTOMY AND ADENOIDECTOMY  1938    Home Medications:  Allergies as of 12/25/2022       Reactions   Magnesium Other (See Comments)   Myasthenia gravis   Penicillamine Other (See Comments)   Myasthenia gravis        Medication List        Accurate as of December 24, 2022  9:52 AM. If you have any questions, ask your nurse or doctor.          Accu-Chek FastClix Lancets Misc USE 1  TO CHECK SUGARS ONCE DAILY. Dx E11.9   Accu-Chek Guide test strip Generic drug: glucose blood USE 1 STRIP TO CHECK GLUCOSE ONCE DAILY DX E 11.9   amLODipine 10 MG tablet Commonly known as: NORVASC Take 1 tablet (10 mg total) by mouth daily.   aspirin EC 81 MG tablet Take 81 mg by mouth daily.   calcium-vitamin D 500-200 MG-UNIT tablet Commonly known as: OSCAL WITH D Take 1 tablet by mouth 2 (two) times daily.   ferrous sulfate 325 (65 FE) MG EC tablet Take 325 mg by mouth 3 (three) times a week. Takes three times weekly. Monday Weds, Friday   finasteride 5  MG tablet Commonly known as: PROSCAR Take 1 tablet by mouth once daily   folic acid 1 MG tablet Commonly known as: FOLVITE Take 1 tablet (1 mg total) by mouth daily.   furosemide 40 MG tablet Commonly known as: LASIX Take 1 tablet (40 mg total) by mouth daily.   gabapentin 100 MG capsule Commonly known as: NEURONTIN Take 1 capsule (100 mg total) by mouth at bedtime.   lovastatin 20 MG tablet Commonly known as: MEVACOR Take 1 tablet (20 mg total) by mouth at bedtime.   metFORMIN 500 MG 24 hr tablet Commonly known as: GLUCOPHAGE-XR Take 1 tablet by mouth once daily with breakfast   mometasone 0.1 % cream Commonly known as: ELOCON Apply to rash on abdomen once or twice a day prn   mycophenolate  500 MG tablet Commonly known as: CELLCEPT Take 1,000 mg by mouth 2 (two) times daily.   olmesartan 40 MG tablet Commonly known as: BENICAR Take 1 tablet by mouth once daily   potassium chloride SA 20 MEQ tablet Commonly known as: KLOR-CON M Take 1 tablet (20 mEq total) by mouth daily.   tamsulosin 0.4 MG Caps capsule Commonly known as: FLOMAX TAKE 1 CAPSULE BY MOUTH ONCE DAILY AFTER  SUPPER What changed:  how much to take when to take this   Vitamin D (Ergocalciferol) 1.25 MG (50000 UNIT) Caps capsule Commonly known as: DRISDOL Take 1 capsule (50,000 Units total) by mouth every 7 (seven) days.        Allergies:  Allergies  Allergen Reactions   Magnesium Other (See Comments)    Myasthenia gravis   Penicillamine Other (See Comments)    Myasthenia gravis    Family History: Family History  Problem Relation Age of Onset   Heart disease Father        heart atack   Alzheimer's disease Mother    CVA Brother     Social History:  reports that he has never smoked. He has never used smokeless tobacco. He reports that he does not drink alcohol and does not use drugs.  ROS: Pertinent ROS in HPI  Physical Exam: There were no vitals taken for this visit.  Constitutional:  Well nourished. Alert and oriented, No acute distress. HEENT:  AT, moist mucus membranes.  Trachea midline, no masses. Cardiovascular: No clubbing, cyanosis, or edema. Respiratory: Normal respiratory effort, no increased work of breathing. GI: Abdomen is soft, non tender, non distended, no abdominal masses. Liver and spleen not palpable.  No hernias appreciated.  Stool sample for occult testing is not indicated.   GU: No CVA tenderness.  No bladder fullness or masses.  Patient with circumcised/uncircumcised phallus. ***Foreskin easily retracted***  Urethral meatus is patent.  No penile discharge. No penile lesions or rashes. Scrotum without lesions, cysts, rashes and/or edema.  Testicles are located  scrotally bilaterally. No masses are appreciated in the testicles. Left and right epididymis are normal. Rectal: Patient with  normal sphincter tone. Anus and perineum without scarring or rashes. No rectal masses are appreciated. Prostate is approximately *** grams, *** nodules are appreciated. Seminal vesicles are normal. Skin: No rashes, bruises or suspicious lesions. Lymph: No cervical or inguinal adenopathy. Neurologic: Grossly intact, no focal deficits, moving all 4 extremities. Psychiatric: Normal mood and affect.  Laboratory Data: Lab Results  Component Value Date   WBC 8.3 12/12/2022   HGB 10.9 (L) 12/12/2022   HCT 32.6 (L) 12/12/2022   MCV 91.3 12/12/2022   PLT 357.0 12/12/2022    Lab  Results  Component Value Date   CREATININE 0.70 12/12/2022    Lab Results  Component Value Date   PSA 0.02 (L) 04/16/2018   PSA 0.52 07/07/2015   PSA 0.45 06/02/2014    Lab Results  Component Value Date   HGBA1C 6.6 (H) 12/12/2022    Lab Results  Component Value Date   TSH 3.25 12/12/2022       Component Value Date/Time   CHOL 112 08/23/2022 1206   HDL 45.50 08/23/2022 1206   CHOLHDL 2 08/23/2022 1206   VLDL 24.8 08/23/2022 1206   LDLCALC 42 08/23/2022 1206    Lab Results  Component Value Date   AST 16 12/12/2022   Lab Results  Component Value Date   ALT 11 12/12/2022   Urinalysis ***  I have reviewed the labs.   Pertinent Imaging: ***  Assessment & Plan:  ***  1. BPH with LU TS -UA benign *** -PVR < 300 cc *** -symptoms - *** -most bothersome symptoms are *** -continue conservative management, avoiding bladder irritants and timed voiding's -Initiate alpha-blocker (***), discussed side effects *** -Initiate 5 alpha reductase inhibitor (***), discussed side effects *** -Continue tamsulosin 0.4 mg daily and finasteride 5 mg daily -Cannot tolerate medication or medication failure, schedule cystoscopy ***  2. Nocturia ***   No follow-ups on  file.  These notes generated with voice recognition software. I apologize for typographical errors.  [place], [place] [address]  [place], [place] [address] [phone]

## 2022-12-25 ENCOUNTER — Ambulatory Visit
Admission: RE | Admit: 2022-12-25 | Discharge: 2022-12-25 | Disposition: A | Discharge status: Home / Self Care | Payer: Medicare Other | Source: Ambulatory Visit | Attending: Urology | Admitting: Urology

## 2022-12-25 ENCOUNTER — Encounter: Payer: Self-pay | Admitting: Urology

## 2022-12-25 ENCOUNTER — Ambulatory Visit (INDEPENDENT_AMBULATORY_CARE_PROVIDER_SITE_OTHER): Payer: Medicare Other | Admitting: Urology

## 2022-12-25 VITALS — BP 114/74 | HR 83 | Ht 71.0 in | Wt 203.0 lb

## 2022-12-25 DIAGNOSIS — G7000 Myasthenia gravis without (acute) exacerbation: Secondary | ICD-10-CM | POA: Diagnosis not present

## 2022-12-25 DIAGNOSIS — R351 Nocturia: Secondary | ICD-10-CM

## 2022-12-25 DIAGNOSIS — R3912 Poor urinary stream: Secondary | ICD-10-CM | POA: Diagnosis not present

## 2022-12-25 DIAGNOSIS — S2232XD Fracture of one rib, left side, subsequent encounter for fracture with routine healing: Secondary | ICD-10-CM | POA: Diagnosis not present

## 2022-12-25 DIAGNOSIS — N390 Urinary tract infection, site not specified: Secondary | ICD-10-CM

## 2022-12-25 DIAGNOSIS — R3914 Feeling of incomplete bladder emptying: Secondary | ICD-10-CM

## 2022-12-25 DIAGNOSIS — R3911 Hesitancy of micturition: Secondary | ICD-10-CM | POA: Diagnosis not present

## 2022-12-25 DIAGNOSIS — I5030 Unspecified diastolic (congestive) heart failure: Secondary | ICD-10-CM | POA: Diagnosis not present

## 2022-12-25 DIAGNOSIS — E119 Type 2 diabetes mellitus without complications: Secondary | ICD-10-CM | POA: Diagnosis not present

## 2022-12-25 DIAGNOSIS — S2231XD Fracture of one rib, right side, subsequent encounter for fracture with routine healing: Secondary | ICD-10-CM | POA: Diagnosis not present

## 2022-12-25 DIAGNOSIS — R82998 Other abnormal findings in urine: Secondary | ICD-10-CM

## 2022-12-25 DIAGNOSIS — N401 Enlarged prostate with lower urinary tract symptoms: Secondary | ICD-10-CM

## 2022-12-25 DIAGNOSIS — I110 Hypertensive heart disease with heart failure: Secondary | ICD-10-CM | POA: Diagnosis not present

## 2022-12-25 DIAGNOSIS — Z87440 Personal history of urinary (tract) infections: Secondary | ICD-10-CM | POA: Diagnosis not present

## 2022-12-25 DIAGNOSIS — R109 Unspecified abdominal pain: Secondary | ICD-10-CM | POA: Diagnosis not present

## 2022-12-25 LAB — URINALYSIS, COMPLETE
Bilirubin, UA: NEGATIVE
Glucose, UA: NEGATIVE
Ketones, UA: NEGATIVE
Nitrite, UA: POSITIVE — AB
Specific Gravity, UA: 1.01 (ref 1.005–1.030)
Urobilinogen, Ur: 0.2 mg/dL (ref 0.2–1.0)
pH, UA: 5 (ref 5.0–7.5)

## 2022-12-25 LAB — MICROSCOPIC EXAMINATION: WBC, UA: >30 /HPF — AB (ref 0–5)

## 2022-12-25 LAB — BLADDER SCAN AMB NON-IMAGING: SCA Result: 12

## 2022-12-25 MED ORDER — SULFAMETHOXAZOLE-TRIMETHOPRIM 800-160 MG PO TABS: 1.0000 | ORAL_TABLET | Freq: Two times a day (BID) | ORAL | 0 refills | Status: DC

## 2022-12-26 DIAGNOSIS — S2231XD Fracture of one rib, right side, subsequent encounter for fracture with routine healing: Secondary | ICD-10-CM | POA: Diagnosis not present

## 2022-12-26 DIAGNOSIS — E119 Type 2 diabetes mellitus without complications: Secondary | ICD-10-CM | POA: Diagnosis not present

## 2022-12-26 DIAGNOSIS — I110 Hypertensive heart disease with heart failure: Secondary | ICD-10-CM | POA: Diagnosis not present

## 2022-12-26 DIAGNOSIS — I5030 Unspecified diastolic (congestive) heart failure: Secondary | ICD-10-CM | POA: Diagnosis not present

## 2022-12-26 DIAGNOSIS — G7000 Myasthenia gravis without (acute) exacerbation: Secondary | ICD-10-CM | POA: Diagnosis not present

## 2022-12-26 DIAGNOSIS — S2232XD Fracture of one rib, left side, subsequent encounter for fracture with routine healing: Secondary | ICD-10-CM | POA: Diagnosis not present

## 2022-12-27 ENCOUNTER — Other Ambulatory Visit: Payer: Self-pay | Admitting: Urology

## 2022-12-27 ENCOUNTER — Encounter: Payer: Self-pay | Admitting: Internal Medicine

## 2022-12-27 DIAGNOSIS — I110 Hypertensive heart disease with heart failure: Secondary | ICD-10-CM | POA: Diagnosis not present

## 2022-12-27 DIAGNOSIS — I5030 Unspecified diastolic (congestive) heart failure: Secondary | ICD-10-CM | POA: Diagnosis not present

## 2022-12-27 DIAGNOSIS — S2231XD Fracture of one rib, right side, subsequent encounter for fracture with routine healing: Secondary | ICD-10-CM | POA: Diagnosis not present

## 2022-12-27 DIAGNOSIS — N390 Urinary tract infection, site not specified: Secondary | ICD-10-CM

## 2022-12-27 DIAGNOSIS — S2232XD Fracture of one rib, left side, subsequent encounter for fracture with routine healing: Secondary | ICD-10-CM | POA: Diagnosis not present

## 2022-12-27 DIAGNOSIS — E119 Type 2 diabetes mellitus without complications: Secondary | ICD-10-CM | POA: Diagnosis not present

## 2022-12-27 DIAGNOSIS — G7000 Myasthenia gravis without (acute) exacerbation: Secondary | ICD-10-CM | POA: Diagnosis not present

## 2022-12-28 ENCOUNTER — Other Ambulatory Visit: Payer: Self-pay

## 2022-12-28 ENCOUNTER — Telehealth: Payer: Self-pay | Admitting: Family Medicine

## 2022-12-28 MED ORDER — POTASSIUM CHLORIDE CRYS ER 20 MEQ PO TBCR: 20.0000 meq | EXTENDED_RELEASE_TABLET | Freq: Every day | ORAL | 2 refills | Status: DC

## 2022-12-28 MED ORDER — FOLIC ACID 1 MG PO TABS: 1.0000 mg | ORAL_TABLET | Freq: Every day | ORAL | 2 refills | Status: DC

## 2022-12-28 NOTE — Telephone Encounter (Signed)
Patient notified and voiced understanding.

## 2022-12-28 NOTE — Telephone Encounter (Signed)
-----   Message from Nori Riis, PA-C sent at 12/27/2022  9:50 PM EST ----- Please let Mr. Turowski know that his KUB did not show any stones, so I have sent an order in for a renal ultrasound.

## 2022-12-31 ENCOUNTER — Encounter: Payer: Self-pay | Admitting: Family Medicine

## 2022-12-31 LAB — CULTURE, URINE COMPREHENSIVE

## 2023-01-01 ENCOUNTER — Telehealth: Payer: Self-pay

## 2023-01-01 DIAGNOSIS — G7000 Myasthenia gravis without (acute) exacerbation: Secondary | ICD-10-CM | POA: Diagnosis not present

## 2023-01-01 DIAGNOSIS — E119 Type 2 diabetes mellitus without complications: Secondary | ICD-10-CM | POA: Diagnosis not present

## 2023-01-01 DIAGNOSIS — I110 Hypertensive heart disease with heart failure: Secondary | ICD-10-CM | POA: Diagnosis not present

## 2023-01-01 DIAGNOSIS — I5030 Unspecified diastolic (congestive) heart failure: Secondary | ICD-10-CM | POA: Diagnosis not present

## 2023-01-01 DIAGNOSIS — S2231XD Fracture of one rib, right side, subsequent encounter for fracture with routine healing: Secondary | ICD-10-CM | POA: Diagnosis not present

## 2023-01-01 DIAGNOSIS — S2232XD Fracture of one rib, left side, subsequent encounter for fracture with routine healing: Secondary | ICD-10-CM | POA: Diagnosis not present

## 2023-01-01 MED ORDER — FOLIC ACID 1 MG PO TABS: 1.0000 mg | ORAL_TABLET | Freq: Every day | ORAL | 2 refills | Status: DC

## 2023-01-01 MED ORDER — POTASSIUM CHLORIDE CRYS ER 20 MEQ PO TBCR: 20.0000 meq | EXTENDED_RELEASE_TABLET | Freq: Every day | ORAL | 2 refills | Status: DC

## 2023-01-02 DIAGNOSIS — G7000 Myasthenia gravis without (acute) exacerbation: Secondary | ICD-10-CM | POA: Diagnosis not present

## 2023-01-02 DIAGNOSIS — S2231XD Fracture of one rib, right side, subsequent encounter for fracture with routine healing: Secondary | ICD-10-CM | POA: Diagnosis not present

## 2023-01-02 DIAGNOSIS — S2232XD Fracture of one rib, left side, subsequent encounter for fracture with routine healing: Secondary | ICD-10-CM | POA: Diagnosis not present

## 2023-01-02 DIAGNOSIS — I110 Hypertensive heart disease with heart failure: Secondary | ICD-10-CM | POA: Diagnosis not present

## 2023-01-02 DIAGNOSIS — I5030 Unspecified diastolic (congestive) heart failure: Secondary | ICD-10-CM | POA: Diagnosis not present

## 2023-01-02 DIAGNOSIS — E119 Type 2 diabetes mellitus without complications: Secondary | ICD-10-CM | POA: Diagnosis not present

## 2023-01-04 ENCOUNTER — Other Ambulatory Visit: Payer: Self-pay

## 2023-01-04 ENCOUNTER — Ambulatory Visit
Admission: RE | Admit: 2023-01-04 | Discharge: 2023-01-04 | Disposition: A | Discharge status: Home / Self Care | Payer: Medicare Other | Source: Ambulatory Visit | Attending: Urology | Admitting: Urology

## 2023-01-04 DIAGNOSIS — N390 Urinary tract infection, site not specified: Secondary | ICD-10-CM | POA: Diagnosis not present

## 2023-01-04 DIAGNOSIS — I5030 Unspecified diastolic (congestive) heart failure: Secondary | ICD-10-CM | POA: Diagnosis not present

## 2023-01-04 DIAGNOSIS — I110 Hypertensive heart disease with heart failure: Secondary | ICD-10-CM | POA: Diagnosis not present

## 2023-01-04 DIAGNOSIS — S2232XD Fracture of one rib, left side, subsequent encounter for fracture with routine healing: Secondary | ICD-10-CM | POA: Diagnosis not present

## 2023-01-04 DIAGNOSIS — G7000 Myasthenia gravis without (acute) exacerbation: Secondary | ICD-10-CM | POA: Diagnosis not present

## 2023-01-04 DIAGNOSIS — S2231XD Fracture of one rib, right side, subsequent encounter for fracture with routine healing: Secondary | ICD-10-CM | POA: Diagnosis not present

## 2023-01-04 DIAGNOSIS — E119 Type 2 diabetes mellitus without complications: Secondary | ICD-10-CM | POA: Diagnosis not present

## 2023-01-04 MED ORDER — FOLIC ACID 1 MG PO TABS: 1.0000 mg | ORAL_TABLET | Freq: Every day | ORAL | 2 refills | Status: DC

## 2023-01-04 MED ORDER — POTASSIUM CHLORIDE CRYS ER 20 MEQ PO TBCR: 20.0000 meq | EXTENDED_RELEASE_TABLET | Freq: Every day | ORAL | 2 refills | Status: DC

## 2023-01-04 NOTE — Telephone Encounter (Signed)
Medication was sent in on 2/27 but was no print. Resent rx electronically to [facility]

## 2023-01-04 NOTE — Telephone Encounter (Signed)
Pt need a refill on KLOR-CON M and folic acid sent to [facility]

## 2023-01-05 DIAGNOSIS — R338 Other retention of urine: Secondary | ICD-10-CM | POA: Diagnosis not present

## 2023-01-05 DIAGNOSIS — D508 Other iron deficiency anemias: Secondary | ICD-10-CM | POA: Diagnosis not present

## 2023-01-05 DIAGNOSIS — M1990 Unspecified osteoarthritis, unspecified site: Secondary | ICD-10-CM | POA: Diagnosis not present

## 2023-01-05 DIAGNOSIS — E871 Hypo-osmolality and hyponatremia: Secondary | ICD-10-CM | POA: Diagnosis not present

## 2023-01-05 DIAGNOSIS — I5030 Unspecified diastolic (congestive) heart failure: Secondary | ICD-10-CM | POA: Diagnosis not present

## 2023-01-05 DIAGNOSIS — S2232XD Fracture of one rib, left side, subsequent encounter for fracture with routine healing: Secondary | ICD-10-CM | POA: Diagnosis not present

## 2023-01-05 DIAGNOSIS — Z6827 Body mass index (BMI) 27.0-27.9, adult: Secondary | ICD-10-CM | POA: Diagnosis not present

## 2023-01-05 DIAGNOSIS — K219 Gastro-esophageal reflux disease without esophagitis: Secondary | ICD-10-CM | POA: Diagnosis not present

## 2023-01-05 DIAGNOSIS — M21371 Foot drop, right foot: Secondary | ICD-10-CM | POA: Diagnosis not present

## 2023-01-05 DIAGNOSIS — I110 Hypertensive heart disease with heart failure: Secondary | ICD-10-CM | POA: Diagnosis not present

## 2023-01-05 DIAGNOSIS — S2231XD Fracture of one rib, right side, subsequent encounter for fracture with routine healing: Secondary | ICD-10-CM | POA: Diagnosis not present

## 2023-01-05 DIAGNOSIS — Z9181 History of falling: Secondary | ICD-10-CM | POA: Diagnosis not present

## 2023-01-05 DIAGNOSIS — Z7984 Long term (current) use of oral hypoglycemic drugs: Secondary | ICD-10-CM | POA: Diagnosis not present

## 2023-01-05 DIAGNOSIS — E119 Type 2 diabetes mellitus without complications: Secondary | ICD-10-CM | POA: Diagnosis not present

## 2023-01-05 DIAGNOSIS — Z7982 Long term (current) use of aspirin: Secondary | ICD-10-CM | POA: Diagnosis not present

## 2023-01-05 DIAGNOSIS — Z8673 Personal history of transient ischemic attack (TIA), and cerebral infarction without residual deficits: Secondary | ICD-10-CM | POA: Diagnosis not present

## 2023-01-05 DIAGNOSIS — G2581 Restless legs syndrome: Secondary | ICD-10-CM | POA: Diagnosis not present

## 2023-01-05 DIAGNOSIS — Z96649 Presence of unspecified artificial hip joint: Secondary | ICD-10-CM | POA: Diagnosis not present

## 2023-01-05 DIAGNOSIS — N401 Enlarged prostate with lower urinary tract symptoms: Secondary | ICD-10-CM | POA: Diagnosis not present

## 2023-01-05 DIAGNOSIS — G7000 Myasthenia gravis without (acute) exacerbation: Secondary | ICD-10-CM | POA: Diagnosis not present

## 2023-01-05 DIAGNOSIS — E559 Vitamin D deficiency, unspecified: Secondary | ICD-10-CM | POA: Diagnosis not present

## 2023-01-05 DIAGNOSIS — E46 Unspecified protein-calorie malnutrition: Secondary | ICD-10-CM | POA: Diagnosis not present

## 2023-01-05 DIAGNOSIS — E7800 Pure hypercholesterolemia, unspecified: Secondary | ICD-10-CM | POA: Diagnosis not present

## 2023-01-05 DIAGNOSIS — Z85828 Personal history of other malignant neoplasm of skin: Secondary | ICD-10-CM | POA: Diagnosis not present

## 2023-01-07 ENCOUNTER — Ambulatory Visit (INDEPENDENT_AMBULATORY_CARE_PROVIDER_SITE_OTHER): Payer: Medicare Other | Admitting: Dermatology

## 2023-01-07 ENCOUNTER — Encounter: Payer: Self-pay | Admitting: Family Medicine

## 2023-01-07 DIAGNOSIS — Z85828 Personal history of other malignant neoplasm of skin: Secondary | ICD-10-CM

## 2023-01-07 DIAGNOSIS — D692 Other nonthrombocytopenic purpura: Secondary | ICD-10-CM | POA: Diagnosis not present

## 2023-01-07 DIAGNOSIS — Z8589 Personal history of malignant neoplasm of other organs and systems: Secondary | ICD-10-CM

## 2023-01-07 DIAGNOSIS — L814 Other melanin hyperpigmentation: Secondary | ICD-10-CM | POA: Diagnosis not present

## 2023-01-07 DIAGNOSIS — L821 Other seborrheic keratosis: Secondary | ICD-10-CM

## 2023-01-07 DIAGNOSIS — L570 Actinic keratosis: Secondary | ICD-10-CM

## 2023-01-07 DIAGNOSIS — L578 Other skin changes due to chronic exposure to nonionizing radiation: Secondary | ICD-10-CM

## 2023-01-07 DIAGNOSIS — L820 Inflamed seborrheic keratosis: Secondary | ICD-10-CM | POA: Diagnosis not present

## 2023-01-07 NOTE — Patient Instructions (Addendum)
 Cryotherapy Aftercare  Wash gently with soap and water everyday.   Apply Vaseline and Band-Aid daily until healed. Due to recent changes in healthcare laws, you may see results of your pathology and/or laboratory studies on MyChart before the doctors have had a chance to review them. We understand that in some cases there may be results that are confusing or concerning to you. Please understand that not all results are received at the same time and often the doctors may need to interpret multiple results in order to provide you with the best plan of care or course of treatment. Therefore, we ask that you please give us 2 business days to thoroughly review all your results before contacting the office for clarification. Should we see a critical lab result, you will be contacted sooner.   If You Need Anything After Your Visit  If you have any questions or concerns for your doctor, please call our main line at [phone] and press option 4 to reach your doctor's medical assistant. If no one answers, please leave a voicemail as directed and we will return your call as soon as possible. Messages left after 4 pm will be answered the following business day.   You may also send us a message via MyChart. We typically respond to MyChart messages within 1-2 business days.  For prescription refills, please ask your pharmacy to contact our office. Our fax number is [phone].  If you have an urgent issue when the clinic is closed that cannot wait until the next business day, you can page your doctor at the number below.    Please note that while we do our best to be available for urgent issues outside of office hours, we are not available 24/7.   If you have an urgent issue and are unable to reach us, you may choose to seek medical care at your doctor's office, retail clinic, urgent care center, or emergency room.  If you have a medical emergency, please immediately call 911 or go to the emergency  department.  Pager Numbers  - Dr. Kowalski: [phone]  - Dr. Moye: [phone]  - Dr. Stewart: [phone]  In the event of inclement weather, please call our main line at [phone] for an update on the status of any delays or closures.  Dermatology Medication Tips: Please keep the boxes that topical medications come in in order to help keep track of the instructions about where and how to use these. Pharmacies typically print the medication instructions only on the boxes and not directly on the medication tubes.   If your medication is too expensive, please contact our office at [phone] option 4 or send us a message through MyChart.   We are unable to tell what your co-pay for medications will be in advance as this is different depending on your insurance coverage. However, we may be able to find a substitute medication at lower cost or fill out paperwork to get insurance to cover a needed medication.   If a prior authorization is required to get your medication covered by your insurance company, please allow us 1-2 business days to complete this process.  Drug prices often vary depending on where the prescription is filled and some pharmacies may offer cheaper prices.  The website www.goodrx.com contains coupons for medications through different pharmacies. The prices here do not account for what the cost may be with help from insurance (it may be cheaper with your insurance), but the website can give you the   price if you did not use any insurance.  - You can print the associated coupon and take it with your prescription to the pharmacy.  - You may also stop by our office during regular business hours and pick up a GoodRx coupon card.  - If you need your prescription sent electronically to a different pharmacy, notify our office through [facility] MyChart or by phone at [phone] option 4.     Si Usted Necesita Algo Despus de Su Visita  Tambin puede enviarnos un  mensaje a travs de MyChart. Por lo general respondemos a los mensajes de MyChart en el transcurso de 1 a 2 das hbiles.  Para renovar recetas, por favor pida a su farmacia que se ponga en contacto con nuestra oficina. Nuestro nmero de fax es el [phone].  Si tiene un asunto urgente cuando la clnica est cerrada y que no puede esperar hasta el siguiente da hbil, puede llamar/localizar a su doctor(a) al nmero que aparece a continuacin.   Por favor, tenga en cuenta que aunque hacemos todo lo posible para estar disponibles para asuntos urgentes fuera del horario de oficina, no estamos disponibles las 24 horas del da, los 7 das de la semana.   Si tiene un problema urgente y no puede comunicarse con nosotros, puede optar por buscar atencin mdica  en el consultorio de su doctor(a), en una clnica privada, en un centro de atencin urgente o en una sala de emergencias.  Si tiene una emergencia mdica, por favor llame inmediatamente al 911 o vaya a la sala de emergencias.  Nmeros de bper  - Dr. Kowalski: [phone]  - Dra. Moye: [phone]  - Dra. Stewart: [phone]  En caso de inclemencias del tiempo, por favor llame a nuestra lnea principal al [phone] para una actualizacin sobre el estado de cualquier retraso o cierre.  Consejos para la medicacin en dermatologa: Por favor, guarde las cajas en las que vienen los medicamentos de uso tpico para ayudarle a seguir las instrucciones sobre dnde y cmo usarlos. Las farmacias generalmente imprimen las instrucciones del medicamento slo en las cajas y no directamente en los tubos del medicamento.   Si su medicamento es muy caro, por favor, pngase en contacto con nuestra oficina llamando al [phone] y presione la opcin 4 o envenos un mensaje a travs de MyChart.   No podemos decirle cul ser su copago por los medicamentos por adelantado ya que esto es diferente dependiendo de la cobertura de su seguro. Sin embargo,  es posible que podamos encontrar un medicamento sustituto a menor costo o llenar un formulario para que el seguro cubra el medicamento que se considera necesario.   Si se requiere una autorizacin previa para que su compaa de seguros cubra su medicamento, por favor permtanos de 1 a 2 das hbiles para completar este proceso.  Los precios de los medicamentos varan con frecuencia dependiendo del lugar de dnde se surte la receta y alguna farmacias pueden ofrecer precios ms baratos.  El sitio web www.goodrx.com tiene cupones para medicamentos de diferentes farmacias. Los precios aqu no tienen en cuenta lo que podra costar con la ayuda del seguro (puede ser ms barato con su seguro), pero el sitio web puede darle el precio si no utiliz ningn seguro.  - Puede imprimir el cupn correspondiente y llevarlo con su receta a la farmacia.  - Tambin puede pasar por nuestra oficina durante el horario de atencin regular y recoger una tarjeta de cupones de GoodRx.  - Si necesita que   su receta se enve electrnicamente a una farmacia diferente, informe a nuestra oficina a travs de MyChart de [facility] o por telfono llamando al [phone] y presione la opcin 4.  

## 2023-01-07 NOTE — Progress Notes (Signed)
Follow-Up Visit   Subjective  [name] is a 89 y.o. male who presents for the following: Follow-up (7 months f/u on precancers on his face and scalp). The patient has  lesions to be evaluated, some may be new or changing.  The following portions of the chart were reviewed this encounter and updated as appropriate:   Tobacco  Allergies  Meds  Problems  Med Hx  Surg Hx  Fam Hx     Review of Systems:  No other skin or systemic complaints except as noted in HPI or Assessment and Plan.  Objective  Well appearing patient in no apparent distress; mood and affect are within normal limits.  A focused examination was performed including face,scalp.arms,hands. Relevant physical exam findings are noted in the Assessment and Plan.  face x 8 (8) Erythematous thin papules/macules with gritty scale.   face x 2 (2) Stuck-on, waxy, tan-brown papule -- Discussed benign etiology and prognosis.    Assessment & Plan  AK (actinic keratosis) (8) face x 8 Actinic keratoses are precancerous spots that appear secondary to cumulative UV radiation exposure/sun exposure over time. They are chronic with expected duration over 1 year. A portion of actinic keratoses will progress to squamous cell carcinoma of the skin. It is not possible to reliably predict which spots will progress to skin cancer and so treatment is recommended to prevent development of skin cancer.  Recommend daily broad spectrum sunscreen SPF 30+ to sun-exposed areas, reapply every 2 hours as needed.  Recommend staying in the shade or wearing long sleeves, sun glasses (UVA+UVB protection) and wide brim hats (4-inch brim around the entire circumference of the hat). Call for new or changing lesions.   Destruction of lesion - face x 8 Complexity: simple   Destruction method: cryotherapy   Informed consent: discussed and consent obtained   Timeout:  patient name, date of birth, surgical site, and procedure verified Lesion  destroyed using liquid nitrogen: Yes   Region frozen until ice ball extended beyond lesion: Yes   Outcome: patient tolerated procedure well with no complications   Post-procedure details: wound care instructions given    Inflamed seborrheic keratosis (2) face x 2 Symptomatic, irritating, patient would like treated.  Destruction of lesion - face x 2 Complexity: simple   Destruction method: cryotherapy   Informed consent: discussed and consent obtained   Timeout:  patient name, date of birth, surgical site, and procedure verified Lesion destroyed using liquid nitrogen: Yes   Region frozen until ice ball extended beyond lesion: Yes   Outcome: patient tolerated procedure well with no complications   Post-procedure details: wound care instructions given    Purpura - Chronic; persistent and recurrent.  Treatable, but not curable. - Violaceous macules and patches - Benign - Related to trauma, age, sun damage and/or use of blood thinners, chronic use of topical and/or oral steroids - Observe - Can use OTC arnica containing moisturizer such as Dermend Bruise Formula if desired - Call for worsening or other concerns   Lentigines - Scattered tan macules - Due to sun exposure - Benign-appearing, observe - Recommend daily broad spectrum sunscreen SPF 30+ to sun-exposed areas, reapply every 2 hours as needed. - Call for any changes   Actinic Damage - chronic, secondary to cumulative UV radiation exposure/sun exposure over time - diffuse scaly erythematous macules with underlying dyspigmentation - Recommend daily broad spectrum sunscreen SPF 30+ to sun-exposed areas, reapply every 2 hours as needed.  - Recommend staying in the  shade or wearing long sleeves, sun glasses (UVA+UVB protection) and wide brim hats (4-inch brim around the entire circumference of the hat). - Call for new or changing lesions.   Seborrheic Keratoses - Stuck-on, waxy, tan-brown papules and/or plaques  -  Benign-appearing - Discussed benign etiology and prognosis. - Observe - Call for any changes  History of Basal Cell Carcinoma of the Skin Multiple see history - No evidence of recurrence today - Recommend regular full body skin exams - Recommend daily broad spectrum sunscreen SPF 30+ to sun-exposed areas, reapply every 2 hours as needed.  - Call if any new or changing lesions are noted between office visits   History of Squamous Cell Carcinoma of the Skin Multiple see history - No evidence of recurrence today - No lymphadenopathy - Recommend regular full body skin exams - Recommend daily broad spectrum sunscreen SPF 30+ to sun-exposed areas, reapply every 2 hours as needed.  - Call if any new or changing lesions are noted between office visits   Return in about 6 months (around 07/10/2023) for [place], [place].  IMarye Round, CMA, am acting as scribe for Sarina Ser, MD .  Documentation: I have reviewed the above documentation for accuracy and completeness, and I agree with the above.  Sarina Ser, MD

## 2023-01-08 DIAGNOSIS — S2232XD Fracture of one rib, left side, subsequent encounter for fracture with routine healing: Secondary | ICD-10-CM | POA: Diagnosis not present

## 2023-01-08 DIAGNOSIS — E119 Type 2 diabetes mellitus without complications: Secondary | ICD-10-CM | POA: Diagnosis not present

## 2023-01-08 DIAGNOSIS — G7000 Myasthenia gravis without (acute) exacerbation: Secondary | ICD-10-CM | POA: Diagnosis not present

## 2023-01-08 DIAGNOSIS — S2231XD Fracture of one rib, right side, subsequent encounter for fracture with routine healing: Secondary | ICD-10-CM | POA: Diagnosis not present

## 2023-01-08 DIAGNOSIS — I5030 Unspecified diastolic (congestive) heart failure: Secondary | ICD-10-CM | POA: Diagnosis not present

## 2023-01-08 DIAGNOSIS — I110 Hypertensive heart disease with heart failure: Secondary | ICD-10-CM | POA: Diagnosis not present

## 2023-01-09 DIAGNOSIS — S2231XD Fracture of one rib, right side, subsequent encounter for fracture with routine healing: Secondary | ICD-10-CM | POA: Diagnosis not present

## 2023-01-09 DIAGNOSIS — S2232XD Fracture of one rib, left side, subsequent encounter for fracture with routine healing: Secondary | ICD-10-CM | POA: Diagnosis not present

## 2023-01-09 DIAGNOSIS — I5030 Unspecified diastolic (congestive) heart failure: Secondary | ICD-10-CM | POA: Diagnosis not present

## 2023-01-09 DIAGNOSIS — E119 Type 2 diabetes mellitus without complications: Secondary | ICD-10-CM | POA: Diagnosis not present

## 2023-01-09 DIAGNOSIS — I110 Hypertensive heart disease with heart failure: Secondary | ICD-10-CM | POA: Diagnosis not present

## 2023-01-09 DIAGNOSIS — G7000 Myasthenia gravis without (acute) exacerbation: Secondary | ICD-10-CM | POA: Diagnosis not present

## 2023-01-12 ENCOUNTER — Encounter: Payer: Self-pay | Admitting: Dermatology

## 2023-01-14 ENCOUNTER — Ambulatory Visit: Payer: Medicare Other

## 2023-01-15 ENCOUNTER — Telehealth: Payer: Self-pay | Admitting: Family Medicine

## 2023-01-15 ENCOUNTER — Telehealth: Payer: Self-pay

## 2023-01-15 DIAGNOSIS — I5030 Unspecified diastolic (congestive) heart failure: Secondary | ICD-10-CM | POA: Diagnosis not present

## 2023-01-15 DIAGNOSIS — S2231XD Fracture of one rib, right side, subsequent encounter for fracture with routine healing: Secondary | ICD-10-CM | POA: Diagnosis not present

## 2023-01-15 DIAGNOSIS — S2232XD Fracture of one rib, left side, subsequent encounter for fracture with routine healing: Secondary | ICD-10-CM | POA: Diagnosis not present

## 2023-01-15 DIAGNOSIS — G7000 Myasthenia gravis without (acute) exacerbation: Secondary | ICD-10-CM | POA: Diagnosis not present

## 2023-01-15 DIAGNOSIS — I110 Hypertensive heart disease with heart failure: Secondary | ICD-10-CM | POA: Diagnosis not present

## 2023-01-15 DIAGNOSIS — E119 Type 2 diabetes mellitus without complications: Secondary | ICD-10-CM | POA: Diagnosis not present

## 2023-01-15 NOTE — Telephone Encounter (Signed)
Called daughter to confirm that nothing acute was going on where patient should be seen sooner than tomorrow. Patient will be seen by Joycelyn Schmid on 3/13. They have been advised to be seen sooner if symptoms change or worsen

## 2023-01-15 NOTE — Telephone Encounter (Signed)
[facility] called and states patient is having UTI symptoms dark urine, confusion and very week. She is wanting to know if she can collect a urine and bring it in to be tested she has a sterile urine cup?

## 2023-01-15 NOTE — Telephone Encounter (Signed)
Nori Riis, PA-C sent to Kyra Manges, CMA Caller: Unspecified (Today, 11:29 AM) It is best that we see him in clinic or go to the ED.  With confusion and being very weak, this could be the beginning of sepsis or some other serious medical condition.    Libby notified and appointment has been made.

## 2023-01-15 NOTE — Telephone Encounter (Signed)
Patient's daughter-in-law, Brisyn Kazee, called to state patient has been feeling weak and a little off, possible UTI, urine dark.  Patient has had some soreness in abdomen but it could be physical therapy exercise.  Patient has had just a little confusion, but she thinks it may be because he doesn't feel great and he feels kind of weak.  [facility] states this started last Friday (01/11/2023).  Patient has been scheduled for an appointment tomorrow with Mable Paris, NP.

## 2023-01-16 ENCOUNTER — Encounter: Payer: Self-pay | Admitting: Family

## 2023-01-16 ENCOUNTER — Ambulatory Visit (INDEPENDENT_AMBULATORY_CARE_PROVIDER_SITE_OTHER): Payer: Medicare Other | Admitting: Family

## 2023-01-16 ENCOUNTER — Ambulatory Visit (INDEPENDENT_AMBULATORY_CARE_PROVIDER_SITE_OTHER): Payer: Medicare Other

## 2023-01-16 ENCOUNTER — Other Ambulatory Visit: Payer: Self-pay | Admitting: Family

## 2023-01-16 ENCOUNTER — Ambulatory Visit: Payer: Self-pay

## 2023-01-16 VITALS — BP 118/78 | HR 71 | Temp 98.6°F | Ht 69.0 in | Wt 209.2 lb

## 2023-01-16 DIAGNOSIS — R399 Unspecified symptoms and signs involving the genitourinary system: Secondary | ICD-10-CM | POA: Diagnosis not present

## 2023-01-16 DIAGNOSIS — I5030 Unspecified diastolic (congestive) heart failure: Secondary | ICD-10-CM | POA: Diagnosis not present

## 2023-01-16 DIAGNOSIS — R110 Nausea: Secondary | ICD-10-CM

## 2023-01-16 DIAGNOSIS — R0609 Other forms of dyspnea: Secondary | ICD-10-CM | POA: Diagnosis not present

## 2023-01-16 DIAGNOSIS — E538 Deficiency of other specified B group vitamins: Secondary | ICD-10-CM

## 2023-01-16 DIAGNOSIS — I110 Hypertensive heart disease with heart failure: Secondary | ICD-10-CM | POA: Diagnosis not present

## 2023-01-16 DIAGNOSIS — J90 Pleural effusion, not elsewhere classified: Secondary | ICD-10-CM | POA: Diagnosis not present

## 2023-01-16 DIAGNOSIS — S2232XD Fracture of one rib, left side, subsequent encounter for fracture with routine healing: Secondary | ICD-10-CM | POA: Diagnosis not present

## 2023-01-16 DIAGNOSIS — R0602 Shortness of breath: Secondary | ICD-10-CM | POA: Diagnosis not present

## 2023-01-16 DIAGNOSIS — G7000 Myasthenia gravis without (acute) exacerbation: Secondary | ICD-10-CM | POA: Diagnosis not present

## 2023-01-16 DIAGNOSIS — J9811 Atelectasis: Secondary | ICD-10-CM | POA: Diagnosis not present

## 2023-01-16 DIAGNOSIS — S2231XD Fracture of one rib, right side, subsequent encounter for fracture with routine healing: Secondary | ICD-10-CM | POA: Diagnosis not present

## 2023-01-16 DIAGNOSIS — E119 Type 2 diabetes mellitus without complications: Secondary | ICD-10-CM | POA: Diagnosis not present

## 2023-01-16 LAB — COMPREHENSIVE METABOLIC PANEL
ALT: 9 U/L (ref 0–53)
AST: 11 U/L (ref 0–37)
Albumin: 2.9 g/dL — ABNORMAL LOW (ref 3.5–5.2)
Alkaline Phosphatase: 45 U/L (ref 39–117)
BUN: 19 mg/dL (ref 6–23)
CO2: 24 mEq/L (ref 19–32)
Calcium: 9.1 mg/dL (ref 8.4–10.5)
Chloride: 96 mEq/L (ref 96–112)
Creatinine, Ser: 0.67 mg/dL (ref 0.40–1.50)
GFR: 80.04 mL/min (ref >60.00)
Glucose, Bld: 132 mg/dL — ABNORMAL HIGH (ref 70–99)
Potassium: 4.8 mEq/L (ref 3.5–5.1)
Sodium: 129 mEq/L — ABNORMAL LOW (ref 135–145)
Total Bilirubin: 0.9 mg/dL (ref 0.2–1.2)
Total Protein: 5.9 g/dL — ABNORMAL LOW (ref 6.0–8.3)

## 2023-01-16 LAB — URINALYSIS, ROUTINE W REFLEX MICROSCOPIC
Bilirubin Urine: NEGATIVE
Hgb urine dipstick: NEGATIVE
Ketones, ur: NEGATIVE
Leukocytes,Ua: NEGATIVE
Nitrite: NEGATIVE
RBC / HPF: NONE SEEN (ref 0–?)
Specific Gravity, Urine: 1.01 (ref 1.000–1.030)
Total Protein, Urine: NEGATIVE
Urine Glucose: NEGATIVE
Urobilinogen, UA: 0.2 (ref 0.0–1.0)
pH: 6 (ref 5.0–8.0)

## 2023-01-16 LAB — CBC WITH DIFFERENTIAL/PLATELET
Basophils Absolute: 0.1 10*3/uL (ref 0.0–0.1)
Basophils Relative: 0.5 % (ref 0.0–3.0)
Eosinophils Absolute: 0.1 10*3/uL (ref 0.0–0.7)
Eosinophils Relative: 0.5 % (ref 0.0–5.0)
HCT: 28.5 % — ABNORMAL LOW (ref 39.0–52.0)
Hemoglobin: 9.6 g/dL — ABNORMAL LOW (ref 13.0–17.0)
Lymphocytes Relative: 7.4 % — ABNORMAL LOW (ref 12.0–46.0)
Lymphs Abs: 0.9 10*3/uL (ref 0.7–4.0)
MCHC: 33.6 g/dL (ref 30.0–36.0)
MCV: 89 fl (ref 78.0–100.0)
Monocytes Absolute: 1 10*3/uL (ref 0.1–1.0)
Monocytes Relative: 8.5 % (ref 3.0–12.0)
Neutro Abs: 9.5 10*3/uL — ABNORMAL HIGH (ref 1.4–7.7)
Neutrophils Relative %: 83.1 % — ABNORMAL HIGH (ref 43.0–77.0)
Platelets: 446 10*3/uL — ABNORMAL HIGH (ref 150.0–400.0)
RBC: 3.2 Mil/uL — ABNORMAL LOW (ref 4.22–5.81)
RDW: 14.6 % (ref 11.5–15.5)
WBC: 11.4 10*3/uL — ABNORMAL HIGH (ref 4.0–10.5)

## 2023-01-16 LAB — POCT URINALYSIS DIPSTICK
Bilirubin, UA: NEGATIVE
Blood, UA: NEGATIVE
Glucose, UA: NEGATIVE
Ketones, UA: NEGATIVE
Leukocytes, UA: NEGATIVE
Nitrite, UA: NEGATIVE
Protein, UA: POSITIVE — AB
Spec Grav, UA: 1.01 (ref 1.010–1.025)
Urobilinogen, UA: 0.2 E.U./dL
pH, UA: 5.5 (ref 5.0–8.0)

## 2023-01-16 MED ORDER — PANTOPRAZOLE SODIUM 20 MG PO TBEC: 20.0000 mg | DELAYED_RELEASE_TABLET | Freq: Every day | ORAL | 1 refills | Status: DC

## 2023-01-16 MED ORDER — ONDANSETRON HCL 4 MG PO TABS: 4.0000 mg | ORAL_TABLET | Freq: Three times a day (TID) | ORAL | 0 refills | Status: DC | PRN

## 2023-01-16 MED ORDER — CYANOCOBALAMIN 1000 MCG/ML IJ SOLN
1000.0000 ug | Freq: Once | INTRAMUSCULAR | Status: AC
  Administered 2023-01-16: 1000 ug via INTRAMUSCULAR

## 2023-01-16 NOTE — Assessment & Plan Note (Signed)
B12 injection  given today per patient request. Last given 11/02/2022 . Advised him to discuss with Dr Nicki Reaper if b12 needed to be ongoing. His last b12 level were normal.

## 2023-01-16 NOTE — Assessment & Plan Note (Addendum)
Patient has experienced weight gain. unsure if the scales have been accurate previously.  Nonetheless his appetite has decreased.Denies dysphagia.  Fortunately patient is alert and oriented x 4 today.  Very reassuring neurologic exam.  Discussed with patient and son for a long period of time  in regards to 'slight' change in mental status in HPI.  Son did not feel that it was acute or significant as it has been in the past.  I advised neuroimaging including stat MRI brain and son declines at this time.  He will stay vigilant in regards in mental status change and understands importance of report immediately to emergency room. If this were occur.  Patient has a history of TIA.     We discussed differential for acid reflux causing nausea.  Provided him with Protonix and counseled on how to take medication.  May consider Pepcid AC at follow-up.  Discussed foods that are most likely to cause acid reflux.  Provided Zofran to be used as needed for nausea.  We discussed lack of appetite which may be aggravating overall weakness. Pending baseline labs, urine studies.

## 2023-01-16 NOTE — Addendum Note (Signed)
Addended by: [place], Yanina Knupp on: 01/16/2023 03:03 PM   Modules accepted: Orders

## 2023-01-16 NOTE — Progress Notes (Signed)
Assessment & Plan:  Nausea Assessment & Plan: Patient has experienced weight gain. unsure if the scales have been accurate previously.  Nonetheless his appetite has decreased.Denies dysphagia.  Fortunately patient is alert and oriented x 4 today.  Very reassuring neurologic exam.  Discussed with patient and son for a long period of time  in regards to 'slight' change in mental status in HPI.  Son did not feel that it was acute or significant as it has been in the past.  I advised neuroimaging including stat MRI brain and son declines at this time.  He will stay vigilant in regards in mental status change and understands importance of report immediately to emergency room. If this were occur.  Patient has a history of TIA.     We discussed differential for acid reflux causing nausea.  Provided him with Protonix and counseled on how to take medication.  May consider Pepcid AC at follow-up.  Discussed foods that are most likely to cause acid reflux.  Provided Zofran to be used as needed for nausea.  We discussed lack of appetite which may be aggravating overall weakness. Pending baseline labs, urine studies.   Orders: -     Pantoprazole Sodium; Take 1 tablet (20 mg total) by mouth daily. Take 30 min to 1 hr prior to breakfast  Dispense: 30 tablet; Refill: 1 -     Ondansetron HCl; Take 1 tablet (4 mg total) by mouth every 8 (eight) hours as needed for nausea or vomiting.  Dispense: 20 tablet; Refill: 0 -     CBC with Differential/Platelet -     Comprehensive metabolic panel  UTI symptoms -     Urine Culture -     Urinalysis, Routine w reflex microscopic -     POCT urinalysis dipstick  DOE (dyspnea on exertion) Assessment & Plan: Sa02 97%.  He is not labored in his speech.  Unable to walk patient up and down the hall today as he is not wearing his right foot drop brace.  Pending chest x-ray to ensure no infectious etiology.  Question if deconditioning playing a role.   Orders: -     DG Chest 2  View  B12 deficiency Assessment & Plan: B12 injection  given today per patient request. Last given 11/02/2022 . Advised him to discuss with Dr Nicki Reaper if b12 needed to be ongoing. His last b12 level were normal.       Return precautions given.   Risks, benefits, and alternatives of the medications and treatment plan prescribed today were discussed, and patient expressed understanding.   Education regarding symptom management and diagnosis given to patient on AVS either electronically or printed.  Return in about 1 week (around 01/23/2023).  Mable Paris, FNP  Subjective:    Patient ID: [name], male    DOB: May 24, 1926, 89 y.o.   MRN: [id number]  CC: [name] is a 89 y.o. male who presents today for an acute visit.    HPI: Accompanied by son  Son expresses concern that he seemed more 'weak' 3 days ago.  no particular arm or leg weakness. No injury or trauma.   Son has noted that 'slight confusion' such a trouble finding word such as basketball.  He is not acutely worried but had more thought there could be a urinary tract infection  Patient describes that is energy 'gives out'.   He complains of pain around his entire 'ribs' , worse 6 days ago after doing PT with  rubber band. He had noticed SOB with PT. He reports 'some shortness of breath' with exertion. He sits most of the time. He is ambulatory when wearing brace for right foot drop ( he is not wearing  brace today).   No cp, cough,left arm numbness, abdominal pain, fever, leg swelling, orthopnea.   Complains of nausea which starts when he chewing and then he feels nauseated.  No vomiting, trouble swallowing or constipation.  He will start to dry heave.    Appetite is less the past few days due to nausea. He eats 3 meals per day however these past several days , he has felt less of an appetite.   History of DM, myasthenia gravis , TIA   H/o BPH , recurrent UTI    Urine culture 3 weeks ago with  Citrobacter koseri.   Last seen by urology, Larene [facility] 12/25/22 for BPH with LUTS.  Compliant with tamsulosin, finasteride Started on bactrim for 7 days, since completed.   Follow up neurology 09/18/22 , Dr Earle Gell with symptom onset summer 2016 with double vision then left facial droop, left arm weakness. Complicated by herpes zoster right leg resulting in right foot drop.   He is maintained on cellcept '1000mg'$  bid from neurology.  He takes gabapentin '100mg'$  qhs as prescribed by Dr Nicki Reaper.  No h/o ckd  CT head 11/06/2022 without acute intracranial findings  Echocardiogram 11/2022 EF 55-66%  Allergies: Magnesium and Penicillamine Current Outpatient Medications on File Prior to Visit  Medication Sig Dispense Refill   Accu-Chek FastClix Lancets MISC USE 1  TO CHECK SUGARS ONCE DAILY. Dx E11.9 102 each 3   amLODipine (NORVASC) 10 MG tablet Take 1 tablet (10 mg total) by mouth daily. 90 tablet 1   aspirin 81 MG EC tablet Take 81 mg by mouth daily.     calcium-vitamin D (OSCAL WITH D) 500-200 MG-UNIT tablet Take 1 tablet by mouth 2 (two) times daily.     ferrous sulfate 325 (65 FE) MG EC tablet Take 325 mg by mouth 3 (three) times a week. Takes three times weekly. Monday Weds, Friday     finasteride (PROSCAR) 5 MG tablet Take 1 tablet by mouth once daily 90 tablet 3   folic acid (FOLVITE) 1 MG tablet Take 1 tablet (1 mg total) by mouth daily. 30 tablet 2   furosemide (LASIX) 40 MG tablet Take 1 tablet (40 mg total) by mouth daily. 30 tablet 11   gabapentin (NEURONTIN) 100 MG capsule Take 1 capsule (100 mg total) by mouth at bedtime. 30 capsule 1   glucose blood (ACCU-CHEK GUIDE) test strip USE 1 STRIP TO CHECK GLUCOSE ONCE DAILY DX E 11.9 50 each 5   lovastatin (MEVACOR) 20 MG tablet Take 1 tablet (20 mg total) by mouth at bedtime. 90 tablet 0   metFORMIN (GLUCOPHAGE-XR) 500 MG 24 hr tablet Take 1 tablet by mouth once daily with breakfast 90 tablet 0   mycophenolate (CELLCEPT) 500 MG tablet Take 1,000  mg by mouth 2 (two) times daily.     olmesartan (BENICAR) 40 MG tablet Take 1 tablet by mouth once daily 90 tablet 1   potassium chloride SA (KLOR-CON M) 20 MEQ tablet Take 1 tablet (20 mEq total) by mouth daily. 30 tablet 2   sulfamethoxazole-trimethoprim (BACTRIM DS) 800-160 MG tablet Take 1 tablet by mouth every 12 (twelve) hours. 14 tablet 0   tamsulosin (FLOMAX) 0.4 MG CAPS capsule TAKE 1 CAPSULE BY MOUTH ONCE DAILY AFTER  SUPPER (Patient taking differently:  0.4 mg daily after breakfast. TAKE 1 CAPSULE BY MOUTH ONCE DAILY AFTER  SUPPER) 90 capsule 3   Vitamin D, Ergocalciferol, (DRISDOL) 1.25 MG (50000 UNIT) CAPS capsule Take 1 capsule (50,000 Units total) by mouth every 7 (seven) days. 12 capsule 0   No current facility-administered medications on file prior to visit.    Review of Systems  Constitutional:  Negative for chills and fever.  Respiratory:  Positive for shortness of breath (on exertion). Negative for cough.   Cardiovascular:  Negative for chest pain, palpitations and leg swelling.  Gastrointestinal:  Positive for nausea. Negative for abdominal pain, constipation, diarrhea and vomiting.  Neurological:  Positive for weakness. Negative for dizziness.      Objective:    BP 118/78   Pulse 71   Temp 98.6 F (37 C) (Oral)   Ht '5\' 9"'$  (1.753 m)   Wt 249 lb 9.6 oz (113.2 kg)   SpO2 97%   BMI 36.86 kg/m   BP Readings from Last 3 Encounters:  01/16/23 118/78  12/25/22 114/74  12/12/22 120/70   Wt Readings from Last 3 Encounters:  01/16/23 249 lb 9.6 oz (113.2 kg)  12/25/22 203 lb (92.1 kg)  12/12/22 186 lb (84.4 kg)    Physical Exam Vitals reviewed.  Constitutional:      Appearance: He is well-developed.  HENT:     Head: Normocephalic and atraumatic.     Right Ear: Hearing, tympanic membrane, ear canal and external ear normal. No decreased hearing noted. No drainage, swelling or tenderness. No middle ear effusion. Tympanic membrane is not injected, erythematous or  bulging.     Left Ear: Hearing, tympanic membrane, ear canal and external ear normal. No decreased hearing noted. No drainage, swelling or tenderness.  No middle ear effusion. Tympanic membrane is not injected, erythematous or bulging.     Nose: Nose normal.     Right Sinus: No maxillary sinus tenderness or frontal sinus tenderness.     Left Sinus: No maxillary sinus tenderness or frontal sinus tenderness.     Mouth/Throat:     Pharynx: Uvula midline. No oropharyngeal exudate or posterior oropharyngeal erythema.     Tonsils: No tonsillar abscesses.  Eyes:     General: Lids are normal. Lids are everted, no foreign bodies appreciated.     Conjunctiva/sclera: Conjunctivae normal.     Pupils: Pupils are equal, round, and reactive to light.     Comments: Normal fundus bilaterally   Cardiovascular:     Rate and Rhythm: Regular rhythm.     Heart sounds: Normal heart sounds.  Pulmonary:     Effort: Pulmonary effort is normal. No respiratory distress.     Breath sounds: Normal breath sounds. No wheezing, rhonchi or rales.  Musculoskeletal:     Right lower leg: No edema.     Left lower leg: No edema.  Lymphadenopathy:     Head:     Right side of head: No submental, submandibular, tonsillar, preauricular, posterior auricular or occipital adenopathy.     Left side of head: No submental, submandibular, tonsillar, preauricular, posterior auricular or occipital adenopathy.     Cervical: No cervical adenopathy.  Skin:    General: Skin is warm and dry.  Neurological:     Mental Status: He is alert and oriented to person, place, and time.     Cranial Nerves: No cranial nerve deficit or facial asymmetry.     Sensory: No sensory deficit.     Motor: No weakness or tremor.  Coordination: Rapid alternating movements normal.     Deep Tendon Reflexes:     Reflex Scores:      Bicep reflexes are 2+ on the right side and 2+ on the left side.      Patellar reflexes are 2+ on the right side and 2+ on the  left side.    Comments: Grip equal and strong bilateral upper extremities.  Able to perform RAM. Unable to access gait as patient didn't bring brace required for walking with right foot drop. Able to perform  finger-to-nose without difficulty. Right foot strength 4/5 compared to  left foot 5/5.  [id number]   Psychiatric:        Speech: Speech normal.        Behavior: Behavior normal.

## 2023-01-16 NOTE — Patient Instructions (Addendum)
I suspect nausea may be related to acid reflux.please start Protonix as discussed today.  I have also provided you with Zofran which would be an as needed medication for nausea. We will await urine studies however point-of-care urinalysis did not appear grossly infected. We will await results of chest x-ray due to shortness of breath with exertion you described today. We discussed at length ordering a MRI of your brain due to mental status changes noted by your family.     want you to be safe and if you have any changes in mental status ,confusion, facial weakness changes in vision arm or leg weakness, you need to call 911 immediately to be evaluated for stroke.  Please keep your follow-up with Dr. Nicki Reaper and let me know if any acute changes  food Choices for Gastroesophageal Reflux Disease, Adult When you have gastroesophageal reflux disease (GERD), the foods you eat and your eating habits are very important. Choosing the right foods can help ease the discomfort of GERD. Consider working with a dietitian to help you make healthy food choices. What are tips for following this plan? Reading food labels Look for foods that are low in saturated fat. Foods that have less than 5% of daily value (DV) of fat and 0 g of trans fats may help with your symptoms. Cooking Cook foods using methods other than frying. This may include baking, steaming, grilling, or broiling. These are all methods that do not need a lot of fat for cooking. To add flavor, try to use herbs that are low in spice and acidity. Meal planning  Choose healthy foods that are low in fat, such as fruits, vegetables, whole grains, low-fat dairy products, lean meats, fish, and poultry. Eat frequent, small meals instead of three large meals each day. Eat your meals slowly, in a relaxed setting. Avoid bending over or lying down until 2-3 hours after eating. Limit high-fat foods such as fatty meats or fried foods. Limit your intake of fatty  foods, such as oils, butter, and shortening. Avoid the following as told by your health care provider: Foods that cause symptoms. These may be different for different people. Keep a food diary to keep track of foods that cause symptoms. Alcohol. Drinking large amounts of liquid with meals. Eating meals during the 2-3 hours before bed. Lifestyle Maintain a healthy weight. Ask your health care provider what weight is healthy for you. If you need to lose weight, work with your health care provider to do so safely. Exercise for at least 30 minutes on 5 or more days each week, or as told by your health care provider. Avoid wearing clothes that fit tightly around your waist and chest. Do not use any products that contain nicotine or tobacco. These products include cigarettes, chewing tobacco, and vaping devices, such as e-cigarettes. If you need help quitting, ask your health care provider. Sleep with the head of your bed raised. Use a wedge under the mattress or blocks under the bed frame to raise the head of the bed. Chew sugar-free gum after mealtimes. What foods should I eat?  Eat a healthy, well-balanced diet of fruits, vegetables, whole grains, low-fat dairy products, lean meats, fish, and poultry. Each person is different. Foods that may trigger symptoms in one person may not trigger any symptoms in another person. Work with your health care provider to identify foods that are safe for you. The items listed above may not be a complete list of recommended foods and beverages. Contact a  dietitian for more information. What foods should I avoid? Limiting some of these foods may help manage the symptoms of GERD. Everyone is different. Consult a dietitian or your health care provider to help you identify the exact foods to avoid, if any. Fruits Any fruits prepared with added fat. Any fruits that cause symptoms. For some people this may include citrus fruits, such as oranges, grapefruit, pineapple,  and lemons. Vegetables Deep-fried vegetables. [place] fries. Any vegetables prepared with added fat. Any vegetables that cause symptoms. For some people, this may include tomatoes and tomato products, chili peppers, onions and garlic, and horseradish. Grains Pastries or quick breads with added fat. Meats and other proteins High-fat meats, such as fatty beef or pork, hot dogs, ribs, ham, sausage, salami, and bacon. Fried meat or protein, including fried fish and fried chicken. Nuts and nut butters, in large amounts. Dairy Whole milk and chocolate milk. Sour cream. Cream. Ice cream. Cream cheese. Milkshakes. Fats and oils Butter. Margarine. Shortening. Ghee. Beverages Coffee and tea, with or without caffeine. Carbonated beverages. Sodas. Energy drinks. Fruit juice made with acidic fruits, such as orange or grapefruit. Tomato juice. Alcoholic drinks. Sweets and desserts Chocolate and cocoa. Donuts. Seasonings and condiments Pepper. Peppermint and spearmint. Added salt. Any condiments, herbs, or seasonings that cause symptoms. For some people, this may include curry, hot sauce, or vinegar-based salad dressings. The items listed above may not be a complete list of foods and beverages to avoid. Contact a dietitian for more information. Questions to ask your health care provider Diet and lifestyle changes are usually the first steps that are taken to manage symptoms of GERD. If diet and lifestyle changes do not improve your symptoms, talk with your health care provider about taking medicines. Where to find more information [employer] Gastrointestinal [employer]: aboutgerd.org Summary When you have gastroesophageal reflux disease (GERD), food and lifestyle choices may be very helpful in easing the discomfort of GERD. Eat frequent, small meals instead of three large meals each day. Eat your meals slowly, in a relaxed setting. Avoid bending over or lying down until 2-3 hours after  eating. Limit high-fat foods such as fatty meats or fried foods. This information is not intended to replace advice given to you by your health care provider. Make sure you discuss any questions you have with your health care provider. Document Revised: 05/02/2020 Document Reviewed: 05/02/2020 Elsevier Patient Education  [place].

## 2023-01-16 NOTE — Patient Outreach (Signed)
  Care Coordination   01/16/2023 Name: [name] MRN: [id number] DOB: 1926/06/29   Care Coordination Outreach Attempts:  Successful outreach made to patient.   Patient states he is out eating lunch. Request return call on another day.   Follow Up Plan:  Additional outreach attempts will be made to offer the patient care coordination information and services.   Encounter Outcome:  Pt. Request to Call Back   Care Coordination Interventions:  No, not indicated    Quinn Plowman [facility] [place] [phone] direct line

## 2023-01-16 NOTE — Assessment & Plan Note (Signed)
Sa02 97%.  He is not labored in his speech.  Unable to walk patient up and down the hall today as he is not wearing his right foot drop brace.  Pending chest x-ray to ensure no infectious etiology.  Question if deconditioning playing a role.

## 2023-01-17 ENCOUNTER — Telehealth: Payer: Self-pay | Admitting: Family

## 2023-01-17 ENCOUNTER — Ambulatory Visit: Payer: Medicare Other

## 2023-01-17 ENCOUNTER — Ambulatory Visit: Payer: Medicare Other | Admitting: Urology

## 2023-01-17 DIAGNOSIS — R899 Unspecified abnormal finding in specimens from other organs, systems and tissues: Secondary | ICD-10-CM

## 2023-01-17 DIAGNOSIS — E871 Hypo-osmolality and hyponatremia: Secondary | ICD-10-CM

## 2023-01-17 LAB — URINE CULTURE
MICRO NUMBER:: 14687363
Result:: NO GROWTH
SPECIMEN QUALITY:: ADEQUATE

## 2023-01-17 NOTE — Telephone Encounter (Signed)
Spoke to pt and informed him to decrease his liquid intake by 1/3 and keep appt tomorrow. Pt verbalized understanding.

## 2023-01-17 NOTE — Telephone Encounter (Signed)
FYI Dr Claudette Stapler pool  Call pt  How is he feeling? How is breathing? How is nausea?  I called and spoke with pt yesterday and advised in setting of low sodium to increase lasix from '40mg'$  QD to '40mg'$  BID. He will continue to take sodium chloride tablets BID until seen  01/18/23.   Please ask him to come in tomorrow 01/18/23 and sch him in my 10am acute slot. Please ask him to arrive at 9:30ish and we will get labs then. I want to ensure we get labs back on Friday. Please ensure he takes his morning  lasix dose ahead of labs on Friday

## 2023-01-17 NOTE — Telephone Encounter (Signed)
[facility] called in asking for an appt, as per previous message. Pt its booked for 3/15 '@10am'$  with [facility].

## 2023-01-17 NOTE — Telephone Encounter (Signed)
Noted.  Thank you for the update.  Please also inform him to decrease his fluid intake by 1/3.  Keep appt tomorrow.  Glad he is feeling better.

## 2023-01-17 NOTE — Telephone Encounter (Signed)
Spoke to pt and he  stated that he felt better today and did not have that nauseated feeling. Pt is scheduled for 01/18/23 @ 10 am

## 2023-01-18 ENCOUNTER — Encounter: Payer: Self-pay | Admitting: Family

## 2023-01-18 ENCOUNTER — Ambulatory Visit (INDEPENDENT_AMBULATORY_CARE_PROVIDER_SITE_OTHER): Payer: Medicare Other | Admitting: Family

## 2023-01-18 ENCOUNTER — Other Ambulatory Visit (INDEPENDENT_AMBULATORY_CARE_PROVIDER_SITE_OTHER): Payer: Medicare Other

## 2023-01-18 ENCOUNTER — Telehealth: Payer: Self-pay | Admitting: Family

## 2023-01-18 VITALS — BP 122/80 | HR 76 | Temp 98.2°F | Ht 69.0 in | Wt 209.2 lb

## 2023-01-18 DIAGNOSIS — E871 Hypo-osmolality and hyponatremia: Secondary | ICD-10-CM | POA: Diagnosis not present

## 2023-01-18 DIAGNOSIS — R110 Nausea: Secondary | ICD-10-CM | POA: Diagnosis not present

## 2023-01-18 DIAGNOSIS — R0609 Other forms of dyspnea: Secondary | ICD-10-CM | POA: Diagnosis not present

## 2023-01-18 DIAGNOSIS — D649 Anemia, unspecified: Secondary | ICD-10-CM

## 2023-01-18 DIAGNOSIS — J90 Pleural effusion, not elsewhere classified: Secondary | ICD-10-CM | POA: Diagnosis not present

## 2023-01-18 DIAGNOSIS — R899 Unspecified abnormal finding in specimens from other organs, systems and tissues: Secondary | ICD-10-CM

## 2023-01-18 LAB — CBC WITH DIFFERENTIAL/PLATELET
Basophils Absolute: 0.1 10*3/uL (ref 0.0–0.1)
Basophils Relative: 0.6 % (ref 0.0–3.0)
Eosinophils Absolute: 0.1 10*3/uL (ref 0.0–0.7)
Eosinophils Relative: 0.9 % (ref 0.0–5.0)
HCT: 29.9 % — ABNORMAL LOW (ref 39.0–52.0)
Hemoglobin: 9.8 g/dL — ABNORMAL LOW (ref 13.0–17.0)
Lymphocytes Relative: 8.9 % — ABNORMAL LOW (ref 12.0–46.0)
Lymphs Abs: 1 10*3/uL (ref 0.7–4.0)
MCHC: 32.7 g/dL (ref 30.0–36.0)
MCV: 90 fl (ref 78.0–100.0)
Monocytes Absolute: 0.8 10*3/uL (ref 0.1–1.0)
Monocytes Relative: 6.6 % (ref 3.0–12.0)
Neutro Abs: 9.7 10*3/uL — ABNORMAL HIGH (ref 1.4–7.7)
Neutrophils Relative %: 83 % — ABNORMAL HIGH (ref 43.0–77.0)
Platelets: 478 10*3/uL — ABNORMAL HIGH (ref 150.0–400.0)
RBC: 3.33 Mil/uL — ABNORMAL LOW (ref 4.22–5.81)
RDW: 14.5 % (ref 11.5–15.5)
WBC: 11.7 10*3/uL — ABNORMAL HIGH (ref 4.0–10.5)

## 2023-01-18 LAB — COMPREHENSIVE METABOLIC PANEL
ALT: 9 U/L (ref 0–53)
AST: 11 U/L (ref 0–37)
Albumin: 3 g/dL — ABNORMAL LOW (ref 3.5–5.2)
Alkaline Phosphatase: 47 U/L (ref 39–117)
BUN: 14 mg/dL (ref 6–23)
CO2: 26 mEq/L (ref 19–32)
Calcium: 8.7 mg/dL (ref 8.4–10.5)
Chloride: 95 mEq/L — ABNORMAL LOW (ref 96–112)
Creatinine, Ser: 0.74 mg/dL (ref 0.40–1.50)
GFR: 77.67 mL/min (ref >60.00)
Glucose, Bld: 140 mg/dL — ABNORMAL HIGH (ref 70–99)
Potassium: 4.8 mEq/L (ref 3.5–5.1)
Sodium: 130 mEq/L — ABNORMAL LOW (ref 135–145)
Total Bilirubin: 0.6 mg/dL (ref 0.2–1.2)
Total Protein: 6.2 g/dL (ref 6.0–8.3)

## 2023-01-18 LAB — IBC + FERRITIN
Ferritin: 433.7 ng/mL — ABNORMAL HIGH (ref 22.0–322.0)
Iron: 18 ug/dL — ABNORMAL LOW (ref 42–165)
Saturation Ratios: 8.5 % — ABNORMAL LOW (ref 20.0–50.0)
TIBC: 211.4 ug/dL — ABNORMAL LOW (ref 250.0–450.0)
Transferrin: 151 mg/dL — ABNORMAL LOW (ref 212.0–360.0)

## 2023-01-18 LAB — B12 AND FOLATE PANEL
Folate: 17.3 ng/mL (ref >5.9)
Vitamin B-12: 1424 pg/mL — ABNORMAL HIGH (ref 211–911)

## 2023-01-18 NOTE — Telephone Encounter (Signed)
Can I add on ferritin studies to blood from today or 01/16/23?   Would like to add b12 however ferritin takes priority

## 2023-01-18 NOTE — Telephone Encounter (Signed)
Call patient and caregiver, daughter-in-law.  Sodium has increased 1 point which I am pleased to see.   Labs do reveal anemia and I am waiting on iron and B12 studies   please continue limiting fluids, Lasix 40 mg twice daily for 2 or 3 more days then he can resume Lasix 40 mg daily. .  Please continue sodium chloride twice daily .    Please advise him to keep appointment with Dr. Nicki Reaper next week

## 2023-01-18 NOTE — Telephone Encounter (Signed)
I corrected the iron test & add on sheet faxed

## 2023-01-18 NOTE — Progress Notes (Unsigned)
Assessment & Plan:  Pleural effusion on left Assessment & Plan: 01/16/23 CXR  Chronic bronchitic type interstitial lung changes. Small left pleural effusion and minimal bibasilar atelectasis.    CXR 11/10/22 There is increase in infiltrates in both lower lung fields suggesting worsening atelectasis/pneumonia. Possible small bilateral pleural effusions.   11/06/22 CT angio Small right larger than left pleural effusions with adjacent opacity likely reflecting atelectasis.  Chronic left pleural effusion. Consider pulmonology referral. Repeat CXR in 3 weeks.   Orders: -     DG Chest 2 View; Future  Hyponatremia Assessment & Plan: Na 131 --> 137--> 129 --> 130.  Sodium responded to taking sodium chloride tablets over-the-counter twice daily (of note, at hospital discharge 11/2022, recommended to take 3 times daily) and Lasix 40 mg twice daily.  Creatinine remains stable.   He has follow up PCP 01/25/23. Will discuss with Dr Nicki Reaper if he should continue lasix 40mg  BID or resume prior daily dosing.    Orders: -     CBC with Differential/Platelet -     Comprehensive metabolic panel  Abnormal laboratory test -     CBC with Differential/Platelet -     Comprehensive metabolic panel  DOE (dyspnea on exertion) Assessment & Plan: Improved. Clarified that weight 01/16/23 was with WITH wheelchair. This has been addended in my note 01/15/22 and weight corrected by medical assistant.We discussed dry weight 209lbs which he is today.  Following cardiology.  Last seen 11/12/2021. H/o chronic diastolic heart failure last LVEF 60-75%. Cardiology advised to follow up one year.   I have requested cardiology follow up and we will schedule for patient.     Nausea Assessment & Plan: Resolved on protonix 20mg  qam. Presentation c/w GERD.  Advised to continue qd for 6 weeks and then wean off by taking QOD for one week, then stop. He may then use PRN. Counseled on foods that aggravate GERD.    Anemia,  unspecified type Assessment & Plan: Lab Results  Component Value Date   HGB 9.8 (L) 01/18/2023  Normocytic. Ferritin elevated. B12 elevated. White blood cells and platelets elevated as well.   Consider hematology referral for anemia.       Return precautions given.   Risks, benefits, and alternatives of the medications and treatment plan prescribed today were discussed, and patient expressed understanding.   Education regarding symptom management and diagnosis given to patient on AVS either electronically or printed.  No follow-ups on file.  Mable Paris, FNP  Subjective:    Patient ID: [name], male    DOB: 09-24-1926, 89 y.o.   MRN: [id number]  CC: [name] is a 89 y.o. male who presents today for follow up.   HPI: Accompanied by [place], daughter in law.  He is feeling better. No new complaints  Dyspnea has returned to baseline. He denies SOB today.  He has increased Lasix to 40 mg twice daily for the past 2 days.  He is compliant with sodium chloride tablets twice daily.  Nausea has improved.  Denies orthopnea, leg swelling.    Following cardiology.  Last seen 11/12/2021. H/o chronic diastolic heart failure last LVEF 60-75%. Follow up one year. He reports a fall 3 months ago and 'reports rib fracture'.  CXR 11/10/22 There is increase in infiltrates in both lower lung fields suggesting worsening atelectasis/pneumonia. Possible small bilateral pleural effusions.   11/06/22 CT angio Small right larger than left pleural effusions with adjacent opacity likely reflecting atelectasis.  Allergies: Magnesium and  Penicillamine Current Outpatient Medications on File Prior to Visit  Medication Sig Dispense Refill   Accu-Chek FastClix Lancets MISC USE 1  TO CHECK SUGARS ONCE DAILY. Dx E11.9 102 each 3   amLODipine (NORVASC) 10 MG tablet Take 1 tablet (10 mg total) by mouth daily. 90 tablet 1   aspirin 81 MG EC tablet Take 81 mg by mouth daily.      calcium-vitamin D (OSCAL WITH D) 500-200 MG-UNIT tablet Take 1 tablet by mouth 2 (two) times daily.     ferrous sulfate 325 (65 FE) MG EC tablet Take 325 mg by mouth 3 (three) times a week. Takes three times weekly. Monday Weds, Friday     finasteride (PROSCAR) 5 MG tablet Take 1 tablet by mouth once daily 90 tablet 3   folic acid (FOLVITE) 1 MG tablet Take 1 tablet (1 mg total) by mouth daily. 30 tablet 2   furosemide (LASIX) 40 MG tablet Take 1 tablet (40 mg total) by mouth daily. 30 tablet 11   gabapentin (NEURONTIN) 100 MG capsule Take 1 capsule (100 mg total) by mouth at bedtime. 30 capsule 1   glucose blood (ACCU-CHEK GUIDE) test strip USE 1 STRIP TO CHECK GLUCOSE ONCE DAILY DX E 11.9 50 each 5   lovastatin (MEVACOR) 20 MG tablet Take 1 tablet (20 mg total) by mouth at bedtime. 90 tablet 0   metFORMIN (GLUCOPHAGE-XR) 500 MG 24 hr tablet Take 1 tablet by mouth once daily with breakfast 90 tablet 0   mycophenolate (CELLCEPT) 500 MG tablet Take 1,000 mg by mouth 2 (two) times daily.     olmesartan (BENICAR) 40 MG tablet Take 1 tablet by mouth once daily 90 tablet 1   ondansetron (ZOFRAN) 4 MG tablet Take 1 tablet (4 mg total) by mouth every 8 (eight) hours as needed for nausea or vomiting. 20 tablet 0   pantoprazole (PROTONIX) 20 MG tablet Take 1 tablet (20 mg total) by mouth daily. Take 30 min to 1 hr prior to breakfast 30 tablet 1   potassium chloride SA (KLOR-CON M) 20 MEQ tablet Take 1 tablet (20 mEq total) by mouth daily. 30 tablet 2   sulfamethoxazole-trimethoprim (BACTRIM DS) 800-160 MG tablet Take 1 tablet by mouth every 12 (twelve) hours. 14 tablet 0   tamsulosin (FLOMAX) 0.4 MG CAPS capsule TAKE 1 CAPSULE BY MOUTH ONCE DAILY AFTER  SUPPER (Patient taking differently: 0.4 mg daily after breakfast. TAKE 1 CAPSULE BY MOUTH ONCE DAILY AFTER  SUPPER) 90 capsule 3   Vitamin D, Ergocalciferol, (DRISDOL) 1.25 MG (50000 UNIT) CAPS capsule Take 1 capsule (50,000 Units total) by mouth every 7  (seven) days. 12 capsule 0   No current facility-administered medications on file prior to visit.    Review of Systems  Constitutional:  Negative for chills and fever.  Respiratory:  Negative for cough and shortness of breath.   Cardiovascular:  Negative for chest pain, palpitations and leg swelling.  Gastrointestinal:  Negative for abdominal pain, nausea and vomiting.      Objective:    BP 122/80   Pulse 76   Temp 98.2 F (36.8 C) (Oral)   Ht 5\' 9"  (1.753 m)   Wt 209 lb 3.2 oz (94.9 kg)   SpO2 95%   BMI 30.89 kg/m  BP Readings from Last 3 Encounters:  01/18/23 122/80  01/16/23 118/78  12/25/22 114/74   Wt Readings from Last 3 Encounters:  01/18/23 209 lb 3.2 oz (94.9 kg)  01/16/23 209 lb 3.2 oz (  94.9 kg)  12/25/22 203 lb (92.1 kg)    Physical Exam Vitals reviewed.  Constitutional:      Appearance: He is well-developed.  Cardiovascular:     Rate and Rhythm: Regular rhythm.     Heart sounds: Normal heart sounds.  Pulmonary:     Effort: Pulmonary effort is normal. No respiratory distress.     Breath sounds: Normal breath sounds. No wheezing, rhonchi or rales.  Musculoskeletal:     Right lower leg: No edema.     Left lower leg: No edema.  Skin:    General: Skin is warm and dry.  Neurological:     Mental Status: He is alert.  Psychiatric:        Speech: Speech normal.        Behavior: Behavior normal.    I have spent 40 minutes with a patient including precharting, exam, reviewing labs, prior images, medical records, and discussion plan of care.

## 2023-01-18 NOTE — Assessment & Plan Note (Signed)
Improved. Clarified that weight 01/16/23 was with WITH wheelchair. This has been addended in my note 01/15/22.Dry weight 209.

## 2023-01-21 NOTE — Assessment & Plan Note (Signed)
Resolved on protonix 20mg  qam. Presentation c/w GERD.  Advised to continue qd for 6 weeks and then wean off by taking QOD for one week, then stop. He may then use PRN. Counseled on foods that aggravate GERD.

## 2023-01-21 NOTE — Assessment & Plan Note (Addendum)
Lab Results  Component Value Date   HGB 9.8 (L) 01/18/2023  Normocytic. Ferritin elevated. B12 elevated. White blood cells and platelets elevated as well.   Consider hematology referral for anemia.

## 2023-01-21 NOTE — Assessment & Plan Note (Addendum)
Na 131 --> 137--> 129 --> 130.  Sodium responded to taking sodium chloride tablets over-the-counter twice daily (of note, at hospital discharge 11/2022, recommended to take 3 times daily) and Lasix 40 mg twice daily.  Creatinine remains stable.   He has follow up PCP 01/25/23. Will discuss with Dr Nicki Reaper if he should continue lasix 40mg  BID or resume prior daily dosing.

## 2023-01-21 NOTE — Patient Instructions (Addendum)
Please ensure that you call the office to schedule your chest x-ray in 3 weeks time to follow the left pleural effusion   as discussed, you can wean off of protonix 20mg  by taking every OTHER day in 6 weeks time . You may take every other day for one week and then stop.   At that point, you may use protonix for flares  We will schedule sooner follow up for   Food Choices for Gastroesophageal Reflux Disease, Adult When you have gastroesophageal reflux disease (GERD), the foods you eat and your eating habits are very important. Choosing the right foods can help ease the discomfort of GERD. Consider working with a dietitian to help you make healthy food choices. What are tips for following this plan? Reading food labels Look for foods that are low in saturated fat. Foods that have less than 5% of daily value (DV) of fat and 0 g of trans fats may help with your symptoms. Cooking Cook foods using methods other than frying. This may include baking, steaming, grilling, or broiling. These are all methods that do not need a lot of fat for cooking. To add flavor, try to use herbs that are low in spice and acidity. Meal planning  Choose healthy foods that are low in fat, such as fruits, vegetables, whole grains, low-fat dairy products, lean meats, fish, and poultry. Eat frequent, small meals instead of three large meals each day. Eat your meals slowly, in a relaxed setting. Avoid bending over or lying down until 2-3 hours after eating. Limit high-fat foods such as fatty meats or fried foods. Limit your intake of fatty foods, such as oils, butter, and shortening. Avoid the following as told by your health care provider: Foods that cause symptoms. These may be different for different people. Keep a food diary to keep track of foods that cause symptoms. Alcohol. Drinking large amounts of liquid with meals. Eating meals during the 2-3 hours before bed. Lifestyle Maintain a healthy weight. Ask your  health care provider what weight is healthy for you. If you need to lose weight, work with your health care provider to do so safely. Exercise for at least 30 minutes on 5 or more days each week, or as told by your health care provider. Avoid wearing clothes that fit tightly around your waist and chest. Do not use any products that contain nicotine or tobacco. These products include cigarettes, chewing tobacco, and vaping devices, such as e-cigarettes. If you need help quitting, ask your health care provider. Sleep with the head of your bed raised. Use a wedge under the mattress or blocks under the bed frame to raise the head of the bed. Chew sugar-free gum after mealtimes. What foods should I eat?  Eat a healthy, well-balanced diet of fruits, vegetables, whole grains, low-fat dairy products, lean meats, fish, and poultry. Each person is different. Foods that may trigger symptoms in one person may not trigger any symptoms in another person. Work with your health care provider to identify foods that are safe for you. The items listed above may not be a complete list of recommended foods and beverages. Contact a dietitian for more information. What foods should I avoid? Limiting some of these foods may help manage the symptoms of GERD. Everyone is different. Consult a dietitian or your health care provider to help you identify the exact foods to avoid, if any. Fruits Any fruits prepared with added fat. Any fruits that cause symptoms. For some people this may  include citrus fruits, such as oranges, grapefruit, pineapple, and lemons. Vegetables Deep-fried vegetables. [place] fries. Any vegetables prepared with added fat. Any vegetables that cause symptoms. For some people, this may include tomatoes and tomato products, chili peppers, onions and garlic, and horseradish. Grains Pastries or quick breads with added fat. Meats and other proteins High-fat meats, such as fatty beef or pork, hot dogs, ribs,  ham, sausage, salami, and bacon. Fried meat or protein, including fried fish and fried chicken. Nuts and nut butters, in large amounts. Dairy Whole milk and chocolate milk. Sour cream. Cream. Ice cream. Cream cheese. Milkshakes. Fats and oils Butter. Margarine. Shortening. Ghee. Beverages Coffee and tea, with or without caffeine. Carbonated beverages. Sodas. Energy drinks. Fruit juice made with acidic fruits, such as orange or grapefruit. Tomato juice. Alcoholic drinks. Sweets and desserts Chocolate and cocoa. Donuts. Seasonings and condiments Pepper. Peppermint and spearmint. Added salt. Any condiments, herbs, or seasonings that cause symptoms. For some people, this may include curry, hot sauce, or vinegar-based salad dressings. The items listed above may not be a complete list of foods and beverages to avoid. Contact a dietitian for more information. Questions to ask your health care provider Diet and lifestyle changes are usually the first steps that are taken to manage symptoms of GERD. If diet and lifestyle changes do not improve your symptoms, talk with your health care provider about taking medicines. Where to find more information [employer] Gastrointestinal [employer]: aboutgerd.org Summary When you have gastroesophageal reflux disease (GERD), food and lifestyle choices may be very helpful in easing the discomfort of GERD. Eat frequent, small meals instead of three large meals each day. Eat your meals slowly, in a relaxed setting. Avoid bending over or lying down until 2-3 hours after eating. Limit high-fat foods such as fatty meats or fried foods. This information is not intended to replace advice given to you by your health care provider. Make sure you discuss any questions you have with your health care provider. Document Revised: 05/02/2020 Document Reviewed: 05/02/2020 Elsevier Patient Education  [place].

## 2023-01-21 NOTE — Assessment & Plan Note (Addendum)
01/16/23 CXR  Chronic bronchitic type interstitial lung changes. Small left pleural effusion and minimal bibasilar atelectasis.    CXR 11/10/22 There is increase in infiltrates in both lower lung fields suggesting worsening atelectasis/pneumonia. Possible small bilateral pleural effusions.   11/06/22 CT angio Small right larger than left pleural effusions with adjacent opacity likely reflecting atelectasis.  Chronic left pleural effusion. Consider pulmonology referral. Repeat CXR in 3 weeks.

## 2023-01-22 ENCOUNTER — Other Ambulatory Visit: Payer: Self-pay | Admitting: Family

## 2023-01-22 DIAGNOSIS — I5030 Unspecified diastolic (congestive) heart failure: Secondary | ICD-10-CM | POA: Diagnosis not present

## 2023-01-22 DIAGNOSIS — G7000 Myasthenia gravis without (acute) exacerbation: Secondary | ICD-10-CM | POA: Diagnosis not present

## 2023-01-22 DIAGNOSIS — E119 Type 2 diabetes mellitus without complications: Secondary | ICD-10-CM | POA: Diagnosis not present

## 2023-01-22 DIAGNOSIS — I110 Hypertensive heart disease with heart failure: Secondary | ICD-10-CM | POA: Diagnosis not present

## 2023-01-22 DIAGNOSIS — D649 Anemia, unspecified: Secondary | ICD-10-CM

## 2023-01-22 DIAGNOSIS — S2232XD Fracture of one rib, left side, subsequent encounter for fracture with routine healing: Secondary | ICD-10-CM | POA: Diagnosis not present

## 2023-01-22 DIAGNOSIS — S2231XD Fracture of one rib, right side, subsequent encounter for fracture with routine healing: Secondary | ICD-10-CM | POA: Diagnosis not present

## 2023-01-22 NOTE — Telephone Encounter (Signed)
Spoke to pt and he stated that he is taking the Lasix 40 mg and the sodium chloride 2x's per day as well. Daughter in law was not around at the time. Pt stated that he will keep appt with Dr. Nicki Reaper this week on 3/22

## 2023-01-22 NOTE — Progress Notes (Signed)
Given kidney function ok, would recommend continuing lasix for a few more days.  Have him monitor weight.  Fluid restriction.  Will plan for recheck sodium at his f/u visit.  Agree with hematology referral.

## 2023-01-23 DIAGNOSIS — I110 Hypertensive heart disease with heart failure: Secondary | ICD-10-CM | POA: Diagnosis not present

## 2023-01-23 DIAGNOSIS — G7000 Myasthenia gravis without (acute) exacerbation: Secondary | ICD-10-CM | POA: Diagnosis not present

## 2023-01-23 DIAGNOSIS — I5030 Unspecified diastolic (congestive) heart failure: Secondary | ICD-10-CM | POA: Diagnosis not present

## 2023-01-23 DIAGNOSIS — E119 Type 2 diabetes mellitus without complications: Secondary | ICD-10-CM | POA: Diagnosis not present

## 2023-01-23 DIAGNOSIS — S2231XD Fracture of one rib, right side, subsequent encounter for fracture with routine healing: Secondary | ICD-10-CM | POA: Diagnosis not present

## 2023-01-23 DIAGNOSIS — S2232XD Fracture of one rib, left side, subsequent encounter for fracture with routine healing: Secondary | ICD-10-CM | POA: Diagnosis not present

## 2023-01-24 ENCOUNTER — Ambulatory Visit: Payer: Self-pay

## 2023-01-24 NOTE — Patient Outreach (Signed)
  Care Coordination   Follow Up Visit Note   01/24/2023 Name: [name] MRN: [id number] DOB: 10-19-1926  [name] is a 89 y.o. year old male who sees Justin Pheasant, MD for primary care. I spoke with  [name] by phone today.  What matters to the patients health and wellness today?  Patient states he is doing ok. Denies any chest discomfort of shortness of breath. Patient states his home health services will end next week.  Patient states he has a follow up visit with his primary care provider on tomorrow 01/25/23 and is scheduled to see specialist regarding his low sodium level and anemia.  Patient states his most recent weight was 206 lbs.  He states his scales are not working.  Patient states his son will take him to his appointments.    Goals Addressed             This Visit's Progress    Management of health conditions       Interventions Today    Flowsheet Row Most Recent Value  Chronic Disease   Chronic disease during today's visit Hypertension (HTN), Other  [hyponatremia]  General Interventions   General Interventions Discussed/Reviewed General Interventions Reviewed, Doctor Visits  [Evaluation of current treatment plan related to hypertension/ hypernatremia and patients adherence to plan as established by provider.]  Doctor Visits Discussed/Reviewed Doctor Visits Reviewed  [facility] scheduled  provider visits.  Confirmed patient has appointment scheduled with specialist regarding his hypernatremia. Confirmed patient has transportation to provider appointments. Patient reminded of specialist appointment for anemia]  Education Interventions   Education Provided Provided Education  Provided Verbal Education On Other  [patient advised to monitor blood pressure at home at least 2 x per week. Encouraged to obtain new scales and weight daily and record.]  Pharmacy Interventions   Pharmacy Dicussed/Reviewed Pharmacy Topics Reviewed  [medications reviewed  and compliance discussed and encouraged.]           COMPLETED: Obtaining home health order and referral to urology       Interventions Today    Flowsheet Row Most Recent Value  Chronic Disease   Chronic disease during today's visit Hypertension (HTN), Other  [generalized weakness]  General Interventions   General Interventions Discussed/Reviewed General Interventions Reviewed  Kathlynn Grate if patient still receiving home health services and if patient obtained referral to urologist.]  Education Interventions                SDOH assessments and interventions completed:  No     Care Coordination Interventions:  Yes, provided   Follow up plan: Follow up call scheduled for 03/01/23    Encounter Outcome:  Pt. Visit Completed   Quinn Plowman RN,BSN,CCM [place] [phone] direct line

## 2023-01-25 ENCOUNTER — Encounter: Payer: Self-pay | Admitting: Internal Medicine

## 2023-01-25 ENCOUNTER — Ambulatory Visit (INDEPENDENT_AMBULATORY_CARE_PROVIDER_SITE_OTHER): Payer: Medicare Other | Admitting: Internal Medicine

## 2023-01-25 VITALS — BP 118/50 | HR 71 | Temp 97.4°F | Ht 69.0 in | Wt 192.0 lb

## 2023-01-25 DIAGNOSIS — E538 Deficiency of other specified B group vitamins: Secondary | ICD-10-CM | POA: Diagnosis not present

## 2023-01-25 DIAGNOSIS — E1142 Type 2 diabetes mellitus with diabetic polyneuropathy: Secondary | ICD-10-CM

## 2023-01-25 DIAGNOSIS — M21379 Foot drop, unspecified foot: Secondary | ICD-10-CM | POA: Diagnosis not present

## 2023-01-25 DIAGNOSIS — J90 Pleural effusion, not elsewhere classified: Secondary | ICD-10-CM | POA: Diagnosis not present

## 2023-01-25 DIAGNOSIS — N400 Enlarged prostate without lower urinary tract symptoms: Secondary | ICD-10-CM | POA: Diagnosis not present

## 2023-01-25 DIAGNOSIS — E871 Hypo-osmolality and hyponatremia: Secondary | ICD-10-CM

## 2023-01-25 DIAGNOSIS — E7800 Pure hypercholesterolemia, unspecified: Secondary | ICD-10-CM | POA: Diagnosis not present

## 2023-01-25 DIAGNOSIS — R0609 Other forms of dyspnea: Secondary | ICD-10-CM | POA: Diagnosis not present

## 2023-01-25 DIAGNOSIS — D649 Anemia, unspecified: Secondary | ICD-10-CM | POA: Diagnosis not present

## 2023-01-25 DIAGNOSIS — G479 Sleep disorder, unspecified: Secondary | ICD-10-CM

## 2023-01-25 DIAGNOSIS — I10 Essential (primary) hypertension: Secondary | ICD-10-CM

## 2023-01-25 DIAGNOSIS — G7000 Myasthenia gravis without (acute) exacerbation: Secondary | ICD-10-CM

## 2023-01-25 DIAGNOSIS — E1165 Type 2 diabetes mellitus with hyperglycemia: Secondary | ICD-10-CM

## 2023-01-25 LAB — CBC WITH DIFFERENTIAL/PLATELET
Basophils Absolute: 0.1 10*3/uL (ref 0.0–0.1)
Basophils Relative: 1.1 % (ref 0.0–3.0)
Eosinophils Absolute: 0.1 10*3/uL (ref 0.0–0.7)
Eosinophils Relative: 1.3 % (ref 0.0–5.0)
HCT: 30.5 % — ABNORMAL LOW (ref 39.0–52.0)
Hemoglobin: 10 g/dL — ABNORMAL LOW (ref 13.0–17.0)
Lymphocytes Relative: 14.5 % (ref 12.0–46.0)
Lymphs Abs: 1.3 10*3/uL (ref 0.7–4.0)
MCHC: 32.9 g/dL (ref 30.0–36.0)
MCV: 89.5 fl (ref 78.0–100.0)
Monocytes Absolute: 0.6 10*3/uL (ref 0.1–1.0)
Monocytes Relative: 6.7 % (ref 3.0–12.0)
Neutro Abs: 7.1 10*3/uL (ref 1.4–7.7)
Neutrophils Relative %: 76.4 % (ref 43.0–77.0)
Platelets: 620 10*3/uL — ABNORMAL HIGH (ref 150.0–400.0)
RBC: 3.41 Mil/uL — ABNORMAL LOW (ref 4.22–5.81)
RDW: 14.2 % (ref 11.5–15.5)
WBC: 9.3 10*3/uL (ref 4.0–10.5)

## 2023-01-25 LAB — BASIC METABOLIC PANEL
BUN: 9 mg/dL (ref 6–23)
CO2: 28 mEq/L (ref 19–32)
Calcium: 8.9 mg/dL (ref 8.4–10.5)
Chloride: 95 mEq/L — ABNORMAL LOW (ref 96–112)
Creatinine, Ser: 0.72 mg/dL (ref 0.40–1.50)
GFR: 78.31 mL/min (ref >60.00)
Glucose, Bld: 142 mg/dL — ABNORMAL HIGH (ref 70–99)
Potassium: 4.4 mEq/L (ref 3.5–5.1)
Sodium: 133 mEq/L — ABNORMAL LOW (ref 135–145)

## 2023-01-25 LAB — FERRITIN: Ferritin: 274.6 ng/mL (ref 22.0–322.0)

## 2023-01-25 NOTE — Progress Notes (Unsigned)
Subjective:    Patient ID: STEPEHN Osborn, male    DOB: 1926-07-20, 89 y.o.   MRN: [id number]  Patient here for  Chief Complaint  Patient presents with   Medical Management of Chronic Issues    1 week f/u; labs already done    HPI Here as a work in for f/u - recent acute visits.  He is accompanied by his d-n-l [place]. Saw Mable [place] 01/16/23 - concerns regarding nausea and some possible mild confusion.  Recent urine culture negative for infection. Found to be hyponatremic.  Also cxr with small left pleural effusion and minimal bibasilar atelectasis.  Lasix was increased.  Will need to confirm dosing.  It appears he increased from 20mg  lasix to 40mg  daily.  Will call back and confirm.  Is feeling better.  Feels breathing is stable.  No increased sob.  Eating.  No nausea or vomiting reported.  He is able to do ADLs.  Taking tylenol and melatonin to help him sleep.  No chest pain reported.  No abdominal pain reported.  Bowels ok.     Past Medical History:  Diagnosis Date   Actinic keratosis 02/01/2021   midline anterior chin    Allergic rhinitis    Basal cell carcinoma 01/29/2007   Right mid forehead. Excised: 03/24/2007, margins free.   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 Excised 07/27/2014, residual focus AFX, margins free.   Squamous cell carcinoma of skin 04/01/2007   Right forehead, 2cm above mid brow. WD SCC wtih superficial infiltration. Excised: 05/14/2007, margins free   Squamous  cell carcinoma of skin 05/16/2016   Crown. WD SCC, ulcerated. Excised: 07/03/2016, residual MD SCC, deep margin involved. Excised: 07/17/2016, margins free.   TIA (transient ischemic attack) 09/13/2015   Urinary outflow obstruction    mild   Past Surgical History:  Procedure Laterality Date   CATARACT EXTRACTION Bilateral    PARTIAL HIP ARTHROPLASTY  01/2019   PILONIDAL CYST EXCISION  1951   removal   ROTATOR CUFF REPAIR  1995   SKIN CANCER EXCISION     multiple   SQUAMOUS CELL CARCINOMA EXCISION     behind head   TONSILLECTOMY AND ADENOIDECTOMY  1938   Family History  Problem Relation Age of Onset   Heart disease Father        heart atack   Alzheimer's disease Mother    CVA Brother    Social History   Socioeconomic History   Marital status: Widowed    Spouse name: Not on file   Number of children: Not on file   Years of education: Not on file   Highest education level: Not on file  Occupational History   Not on file  Tobacco Use   Smoking status: Never   Smokeless tobacco: Never  Vaping Use   Vaping Use: Never used  Substance and Sexual Activity   Alcohol use: No    Alcohol/week: 0.0 standard drinks of alcohol  Drug use: No   Sexual activity: Not Currently    Birth control/protection: None  Other Topics Concern   Not on file  Social History Narrative   Retired, married. Walks everyday         Social Determinants of Health   Financial Resource Strain: Low Risk  (06/02/2019)   Overall Financial Resource Strain (CARDIA)    Difficulty of Paying Living Expenses: Not hard at all  Food Insecurity: No Food Insecurity (12/17/2022)   Hunger Vital Sign    Worried About Running Out of Food in the Last Year: Never true    Ran Out of Food in the Last Year: Never true  Transportation Needs: No Transportation Needs (12/17/2022)   PRAPARE - Hydrologist (Medical): No    Lack of Transportation (Non-Medical): No  Physical Activity:  Insufficiently Active (06/02/2019)   Exercise Vital Sign    Days of Exercise per Week: 2 days    Minutes of Exercise per Session: 60 min  Stress: No Stress Concern Present (06/02/2019)   [place]    Feeling of Stress : Not at all  Social Connections: Unknown (06/02/2019)   Social Connection and Isolation Panel [NHANES]    Frequency of Communication with Friends and Family: Not asked    Frequency of Social Gatherings with Friends and Family: Not on file    Attends Religious Services: Not on file    Active Member of Clubs or Organizations: Not on file    Attends Archivist Meetings: Not on file    Marital Status: Not on file     Review of Systems  Constitutional:  Negative for fever.       Varying weights.  We weighed his wheelchair previously and weight is not attached.  Weight today with substraction of his wheelchair.   HENT:  Negative for congestion and sinus pressure.   Respiratory:  Negative for cough and chest tightness.        Breathing stable.  No increased sob.   Cardiovascular:  Negative for chest pain and palpitations.  Gastrointestinal:  Negative for abdominal pain, diarrhea, nausea and vomiting.  Genitourinary:  Negative for difficulty urinating and dysuria.  Musculoskeletal:  Negative for joint swelling and myalgias.  Skin:  Negative for color change and rash.  Neurological:  Negative for dizziness and headaches.  Psychiatric/Behavioral:  Negative for agitation and dysphoric mood.        Objective:     BP (!) 118/50 (Patient Position: Sitting, Cuff Size: Normal)   Pulse 71   Temp (!) 97.4 F (36.3 C)   Wt 192 lb (87.1 kg)   SpO2 99%   BMI 28.35 kg/m  Wt Readings from Last 3 Encounters:  01/25/23 192 lb (87.1 kg)  01/18/23 209 lb 3.2 oz (94.9 kg)  01/16/23 209 lb 3.2 oz (94.9 kg)    Physical Exam Constitutional:      General: He is not in acute distress.    Appearance: Normal  appearance. He is well-developed.  HENT:     Head: Normocephalic and atraumatic.     Right Ear: External ear normal.     Left Ear: External ear normal.  Eyes:     General: No scleral icterus.       Right eye: No discharge.        Left eye: No discharge.  Cardiovascular:     Rate and Rhythm: Normal rate and regular rhythm.  Pulmonary:  Effort: Pulmonary effort is normal. No respiratory distress.     Breath sounds: Normal breath sounds.  Abdominal:     General: Bowel sounds are normal.     Palpations: Abdomen is soft.     Tenderness: There is no abdominal tenderness.  Musculoskeletal:        General: No tenderness.     Cervical back: Neck supple. No tenderness.     Comments: No increased swelling.  Brace - in place - bilateral.   Lymphadenopathy:     Cervical: No cervical adenopathy.  Skin:    Findings: No erythema or rash.  Neurological:     Mental Status: He is alert.  Psychiatric:        Mood and Affect: Mood normal.        Behavior: Behavior normal.      Outpatient Encounter Medications as of 01/25/2023  Medication Sig   Accu-Chek FastClix Lancets MISC USE 1  TO CHECK SUGARS ONCE DAILY. Dx E11.9   amLODipine (NORVASC) 10 MG tablet Take 1 tablet (10 mg total) by mouth daily.   aspirin 81 MG EC tablet Take 81 mg by mouth daily.   calcium-vitamin D (OSCAL WITH D) 500-200 MG-UNIT tablet Take 1 tablet by mouth 2 (two) times daily.   ferrous sulfate 325 (65 FE) MG EC tablet Take 325 mg by mouth 3 (three) times a week. Takes three times weekly. Monday Weds, Friday   finasteride (PROSCAR) 5 MG tablet Take 1 tablet by mouth once daily   folic acid (FOLVITE) 1 MG tablet Take 1 tablet (1 mg total) by mouth daily.   furosemide (LASIX) 40 MG tablet Take 1 tablet (40 mg total) by mouth daily.   gabapentin (NEURONTIN) 100 MG capsule Take 1 capsule (100 mg total) by mouth at bedtime.   glucose blood (ACCU-CHEK GUIDE) test strip USE 1 STRIP TO CHECK GLUCOSE ONCE DAILY DX E 11.9    lovastatin (MEVACOR) 20 MG tablet Take 1 tablet (20 mg total) by mouth at bedtime.   metFORMIN (GLUCOPHAGE-XR) 500 MG 24 hr tablet Take 1 tablet by mouth once daily with breakfast   mycophenolate (CELLCEPT) 500 MG tablet Take 1,000 mg by mouth 2 (two) times daily.   olmesartan (BENICAR) 40 MG tablet Take 1 tablet by mouth once daily   ondansetron (ZOFRAN) 4 MG tablet Take 1 tablet (4 mg total) by mouth every 8 (eight) hours as needed for nausea or vomiting.   pantoprazole (PROTONIX) 20 MG tablet Take 1 tablet (20 mg total) by mouth daily. Take 30 min to 1 hr prior to breakfast   potassium chloride SA (KLOR-CON M) 20 MEQ tablet Take 1 tablet (20 mEq total) by mouth daily.   sulfamethoxazole-trimethoprim (BACTRIM DS) 800-160 MG tablet Take 1 tablet by mouth every 12 (twelve) hours.   tamsulosin (FLOMAX) 0.4 MG CAPS capsule TAKE 1 CAPSULE BY MOUTH ONCE DAILY AFTER  SUPPER (Patient taking differently: 0.4 mg daily after breakfast. TAKE 1 CAPSULE BY MOUTH ONCE DAILY AFTER  SUPPER)   Vitamin D, Ergocalciferol, (DRISDOL) 1.25 MG (50000 UNIT) CAPS capsule Take 1 capsule (50,000 Units total) by mouth every 7 (seven) days.   No facility-administered encounter medications on file as of 01/25/2023.     Lab Results  Component Value Date   WBC 9.3 01/25/2023   HGB 10.0 (L) 01/25/2023   HCT 30.5 (L) 01/25/2023   PLT 620.0 (H) 01/25/2023   GLUCOSE 142 (H) 01/25/2023   CHOL 112 08/23/2022   TRIG 124.0 08/23/2022  HDL 45.50 08/23/2022   LDLCALC 42 08/23/2022   ALT 9 01/18/2023   AST 11 01/18/2023   NA 133 (L) 01/25/2023   K 4.4 01/25/2023   CL 95 (L) 01/25/2023   CREATININE 0.72 01/25/2023   BUN 9 01/25/2023   CO2 28 01/25/2023   TSH 3.25 12/12/2022   PSA 0.02 (L) 04/16/2018   HGBA1C 6.6 (H) 12/12/2022   MICROALBUR 11.4 (H) 05/18/2022    US RENAL  Result Date: 01/04/2023 CLINICAL DATA:  Recurrent UTIs EXAM: RENAL / URINARY TRACT ULTRASOUND COMPLETE COMPARISON:  None Available. FINDINGS: Right  Kidney: Renal measurements: 10.6 x 5.8 x 5.6 cm = volume: 183 mL. Echogenicity within normal limits. No mass or hydronephrosis visualized. Left Kidney: Renal measurements: 10.8 x 4.4 x 5.8 cm = volume: 152 mL. Echogenicity within normal limits. No mass or hydronephrosis visualized. Bladder: Appears normal for degree of bladder distention. Other: None. IMPRESSION: Normal study. Electronically Signed   By: Dorise Bullion III M.D.   On: 01/04/2023 17:00       Assessment & Plan:  Anemia, unspecified type Assessment & Plan: Persistent anemia.  Recheck today to confirm not continuing to decrease.  Discussed further evaluation.  Agreeable to hematology referral.    Orders: -     CBC with Differential/Platelet -     Ferritin  Type 2 diabetes mellitus with hyperglycemia, without long-term current use of insulin (HCC) Assessment & Plan: Low carb diet and exercise.  Follow met b and a1c.    Orders: -     Basic metabolic panel  Hyponatremia Assessment & Plan: On lasix 40mg  q day now. (Will clarify dose when returns home).  Feeling better.  Taking sodium tablets.  Recheck sodium today.   Orders: -     Basic metabolic panel  HYPERTENSION, BENIGN Assessment & Plan: On amlodipine and benicar.  Follow pressures.  Follow metabolic panel.   Orders: -     Basic metabolic panel  123456 deficiency Assessment & Plan: Given increased B12 level, will hold on B12 injections currently.  Discussed oral supplementation.    Benign prostatic hyperplasia, unspecified whether lower urinary tract symptoms present Assessment & Plan: Dr Diamantina [place] - flomax and finasteride.  Saw Urology 12/2022 - tamsulosin.  Treated for UTI.  Recent urine check negative for infection.  No acute urinary issues reported.  Follow.    Diabetic polyneuropathy associated with type 2 diabetes mellitus ([place]) Assessment & Plan: Appears to be stable currently.    DOE (dyspnea on exertion) Assessment & Plan: No increased sob reported  today.  Currently on 40mg  lasix q day.  (Increased from 20mg ).  Discussed weight.  Will weigh today on his scales to confirm accuracy with our scales.  Wheelchair weight attached to chair.     Foot-drop, unspecified laterality Assessment & Plan: Foot drop.  He had a lumbar MRI in 07/2022 that showed more of an issue with L5 nerve root compression on the left actually and no significant compressive central canal stenosis and outside EMG was performed. Saw neurology.  AFO recommendation and continued rehab.  Has been working with therapy.      Hypercholesterolemia Assessment & Plan: On lovastatin.  Low cholesterol diet and exercise.  Follow lipid panel and liver function tests.     Myasthenia gravis [facility]) Assessment & Plan: Followed by neurology.  Stable.  Continue cellcept.  Follow cbc.    Pleural effusion on left Assessment & Plan: F/u cxr with small left pleural effusion and minimal bibasilar atelectasis.  On increased lasix dose now.  No increased cough or congestion reported.  Breathing stable.  Plan for repeat cxr.  If persistent effusion, consider pulmonary referral.    Sleep difficulties Assessment & Plan: Taking melatonin q hs.        Einar Pheasant, MD

## 2023-01-27 ENCOUNTER — Encounter: Payer: Self-pay | Admitting: Internal Medicine

## 2023-01-27 NOTE — Assessment & Plan Note (Signed)
F/u cxr with small left pleural effusion and minimal bibasilar atelectasis.  On increased lasix dose now.  No increased cough or congestion reported.  Breathing stable.  Plan for repeat cxr.  If persistent effusion, consider pulmonary referral.

## 2023-01-27 NOTE — Assessment & Plan Note (Signed)
Taking melatonin q hs.   

## 2023-01-27 NOTE — Assessment & Plan Note (Signed)
Given increased B12 level, will hold on B12 injections currently.  Discussed oral supplementation.

## 2023-01-27 NOTE — Assessment & Plan Note (Signed)
No increased sob reported today.  Currently on 40mg  lasix q day.  (Increased from 20mg ).  Discussed weight.  Will weigh today on his scales to confirm accuracy with our scales.  Wheelchair weight attached to chair.

## 2023-01-27 NOTE — Assessment & Plan Note (Signed)
On amlodipine and benicar.  Follow pressures.  Follow metabolic panel.  

## 2023-01-27 NOTE — Assessment & Plan Note (Signed)
Low carb diet and exercise.  Follow met b and a1c.   

## 2023-01-27 NOTE — Assessment & Plan Note (Signed)
Dr Diamantina [place] - flomax and finasteride.  Saw Urology 12/2022 - tamsulosin.  Treated for UTI.  Recent urine check negative for infection.  No acute urinary issues reported.  Follow.

## 2023-01-27 NOTE — Assessment & Plan Note (Signed)
On lasix 40mg  q day now. (Will clarify dose when returns home).  Feeling better.  Taking sodium tablets.  Recheck sodium today.

## 2023-01-27 NOTE — Assessment & Plan Note (Signed)
Followed by neurology.  Stable.  Continue cellcept.  Follow cbc.  ?

## 2023-01-27 NOTE — Assessment & Plan Note (Signed)
Persistent anemia.  Recheck today to confirm not continuing to decrease.  Discussed further evaluation.  Agreeable to hematology referral.

## 2023-01-27 NOTE — Assessment & Plan Note (Signed)
Foot drop.  He had a lumbar MRI in 07/2022 that showed more of an issue with L5 nerve root compression on the left actually and no significant compressive central canal stenosis and outside EMG was performed. Saw neurology.  AFO recommendation and continued rehab.  Has been working with therapy.

## 2023-01-27 NOTE — Assessment & Plan Note (Signed)
On lovastatin.  Low cholesterol diet and exercise.  Follow lipid panel and liver function tests.   

## 2023-01-27 NOTE — Assessment & Plan Note (Signed)
Appears to be stable currently.

## 2023-01-28 ENCOUNTER — Other Ambulatory Visit: Payer: Self-pay

## 2023-01-28 DIAGNOSIS — E871 Hypo-osmolality and hyponatremia: Secondary | ICD-10-CM

## 2023-01-29 DIAGNOSIS — E559 Vitamin D deficiency, unspecified: Secondary | ICD-10-CM | POA: Diagnosis not present

## 2023-01-29 DIAGNOSIS — Z96649 Presence of unspecified artificial hip joint: Secondary | ICD-10-CM

## 2023-01-29 DIAGNOSIS — I5030 Unspecified diastolic (congestive) heart failure: Secondary | ICD-10-CM | POA: Diagnosis not present

## 2023-01-29 DIAGNOSIS — E7800 Pure hypercholesterolemia, unspecified: Secondary | ICD-10-CM

## 2023-01-29 DIAGNOSIS — Z6827 Body mass index (BMI) 27.0-27.9, adult: Secondary | ICD-10-CM

## 2023-01-29 DIAGNOSIS — Z85828 Personal history of other malignant neoplasm of skin: Secondary | ICD-10-CM

## 2023-01-29 DIAGNOSIS — G7000 Myasthenia gravis without (acute) exacerbation: Secondary | ICD-10-CM | POA: Diagnosis not present

## 2023-01-29 DIAGNOSIS — S2231XD Fracture of one rib, right side, subsequent encounter for fracture with routine healing: Secondary | ICD-10-CM | POA: Diagnosis not present

## 2023-01-29 DIAGNOSIS — E46 Unspecified protein-calorie malnutrition: Secondary | ICD-10-CM | POA: Diagnosis not present

## 2023-01-29 DIAGNOSIS — Z9181 History of falling: Secondary | ICD-10-CM

## 2023-01-29 DIAGNOSIS — I110 Hypertensive heart disease with heart failure: Secondary | ICD-10-CM | POA: Diagnosis not present

## 2023-01-29 DIAGNOSIS — D508 Other iron deficiency anemias: Secondary | ICD-10-CM | POA: Diagnosis not present

## 2023-01-29 DIAGNOSIS — N401 Enlarged prostate with lower urinary tract symptoms: Secondary | ICD-10-CM

## 2023-01-29 DIAGNOSIS — S2232XD Fracture of one rib, left side, subsequent encounter for fracture with routine healing: Secondary | ICD-10-CM | POA: Diagnosis not present

## 2023-01-29 DIAGNOSIS — E119 Type 2 diabetes mellitus without complications: Secondary | ICD-10-CM | POA: Diagnosis not present

## 2023-01-29 DIAGNOSIS — M1990 Unspecified osteoarthritis, unspecified site: Secondary | ICD-10-CM | POA: Diagnosis not present

## 2023-01-29 DIAGNOSIS — Z7984 Long term (current) use of oral hypoglycemic drugs: Secondary | ICD-10-CM

## 2023-01-29 DIAGNOSIS — M21371 Foot drop, right foot: Secondary | ICD-10-CM

## 2023-01-29 DIAGNOSIS — E871 Hypo-osmolality and hyponatremia: Secondary | ICD-10-CM | POA: Diagnosis not present

## 2023-01-29 DIAGNOSIS — G2581 Restless legs syndrome: Secondary | ICD-10-CM | POA: Diagnosis not present

## 2023-01-29 DIAGNOSIS — K219 Gastro-esophageal reflux disease without esophagitis: Secondary | ICD-10-CM

## 2023-01-29 DIAGNOSIS — R338 Other retention of urine: Secondary | ICD-10-CM

## 2023-01-29 DIAGNOSIS — Z8673 Personal history of transient ischemic attack (TIA), and cerebral infarction without residual deficits: Secondary | ICD-10-CM

## 2023-01-29 DIAGNOSIS — Z7982 Long term (current) use of aspirin: Secondary | ICD-10-CM

## 2023-01-30 ENCOUNTER — Telehealth: Payer: Self-pay

## 2023-01-30 DIAGNOSIS — S2231XD Fracture of one rib, right side, subsequent encounter for fracture with routine healing: Secondary | ICD-10-CM | POA: Diagnosis not present

## 2023-01-30 DIAGNOSIS — I5030 Unspecified diastolic (congestive) heart failure: Secondary | ICD-10-CM | POA: Diagnosis not present

## 2023-01-30 DIAGNOSIS — G7000 Myasthenia gravis without (acute) exacerbation: Secondary | ICD-10-CM | POA: Diagnosis not present

## 2023-01-30 DIAGNOSIS — I110 Hypertensive heart disease with heart failure: Secondary | ICD-10-CM | POA: Diagnosis not present

## 2023-01-30 DIAGNOSIS — E119 Type 2 diabetes mellitus without complications: Secondary | ICD-10-CM | POA: Diagnosis not present

## 2023-01-30 DIAGNOSIS — S2232XD Fracture of one rib, left side, subsequent encounter for fracture with routine healing: Secondary | ICD-10-CM | POA: Diagnosis not present

## 2023-01-30 NOTE — Telephone Encounter (Signed)
Called to confirm new patient appointment tomorrow. Patient expressed understanding of location.

## 2023-01-31 ENCOUNTER — Inpatient Hospital Stay: Payer: Medicare Other | Attending: Oncology | Admitting: Oncology

## 2023-01-31 ENCOUNTER — Encounter: Payer: Self-pay | Admitting: Oncology

## 2023-01-31 ENCOUNTER — Inpatient Hospital Stay: Payer: Medicare Other

## 2023-01-31 ENCOUNTER — Ambulatory Visit (INDEPENDENT_AMBULATORY_CARE_PROVIDER_SITE_OTHER): Payer: Medicare Other | Admitting: Podiatry

## 2023-01-31 ENCOUNTER — Encounter: Payer: Self-pay | Admitting: Podiatry

## 2023-01-31 VITALS — BP 110/52 | HR 70 | Temp 96.6°F | Resp 18 | Ht 69.0 in | Wt 192.0 lb

## 2023-01-31 DIAGNOSIS — E119 Type 2 diabetes mellitus without complications: Secondary | ICD-10-CM | POA: Diagnosis not present

## 2023-01-31 DIAGNOSIS — M79674 Pain in right toe(s): Secondary | ICD-10-CM

## 2023-01-31 DIAGNOSIS — E1142 Type 2 diabetes mellitus with diabetic polyneuropathy: Secondary | ICD-10-CM | POA: Diagnosis not present

## 2023-01-31 DIAGNOSIS — I10 Essential (primary) hypertension: Secondary | ICD-10-CM | POA: Diagnosis not present

## 2023-01-31 DIAGNOSIS — I5030 Unspecified diastolic (congestive) heart failure: Secondary | ICD-10-CM | POA: Diagnosis not present

## 2023-01-31 DIAGNOSIS — D75839 Thrombocytosis, unspecified: Secondary | ICD-10-CM | POA: Diagnosis not present

## 2023-01-31 DIAGNOSIS — S2232XD Fracture of one rib, left side, subsequent encounter for fracture with routine healing: Secondary | ICD-10-CM | POA: Diagnosis not present

## 2023-01-31 DIAGNOSIS — M79675 Pain in left toe(s): Secondary | ICD-10-CM

## 2023-01-31 DIAGNOSIS — G7000 Myasthenia gravis without (acute) exacerbation: Secondary | ICD-10-CM | POA: Diagnosis not present

## 2023-01-31 DIAGNOSIS — B351 Tinea unguium: Secondary | ICD-10-CM

## 2023-01-31 DIAGNOSIS — S2231XD Fracture of one rib, right side, subsequent encounter for fracture with routine healing: Secondary | ICD-10-CM | POA: Diagnosis not present

## 2023-01-31 DIAGNOSIS — D509 Iron deficiency anemia, unspecified: Secondary | ICD-10-CM | POA: Insufficient documentation

## 2023-01-31 DIAGNOSIS — Z85828 Personal history of other malignant neoplasm of skin: Secondary | ICD-10-CM | POA: Insufficient documentation

## 2023-01-31 DIAGNOSIS — I110 Hypertensive heart disease with heart failure: Secondary | ICD-10-CM | POA: Diagnosis not present

## 2023-01-31 NOTE — Progress Notes (Signed)
This patient returns to my office for at risk foot care.  This patient requires this care by a professional since this patient will be at risk due to having type 2 diabetes.    This patient is unable to cut nails himself since the patient cannot reach his nails.These nails are painful walking and wearing shoes.   This patient presents for at risk foot care today.  General Appearance  Alert, conversant and in no acute stress.  Vascular  Dorsalis pedis and posterior tibial  pulses are weakly  palpable  bilaterally.  Capillary return is within normal limits  bilaterally. Temperature is within normal limits  bilaterally.  Neurologic  Senn-Weinstein monofilament wire test diminished  bilaterally. Muscle power within normal limits bilaterally.  Nails Thick disfigured discolored nails with subungual debris  from hallux to fifth toes bilaterally. No evidence of bacterial infection or drainage bilaterally.  Orthopedic  No limitations of motion  feet .  No crepitus or effusions noted.  No bony pathology or digital deformities noted. Plantar flexed fifth metatarsal left foot.  Mild  HAV  Skin  normotropic skin with no porokeratosis noted bilaterally.  No signs of infections or ulcers noted.   Symptomatic callus sub 5th  Left.  Onychomycosis  Pain in right toes  Pain in left toes  Consent was obtained for treatment procedures.   Mechanical debridement of nails 1-5  bilaterally performed with a nail nipper.  Filed with dremel without incident. .    Return office visit   3 months                  Told patient to return for periodic foot care and evaluation due to potential at risk complications.   Nuha Degner DPM  

## 2023-01-31 NOTE — Progress Notes (Signed)
[facility]  [phone] [phone]  ID: [name] OB: 1926/02/06  MR#: [id number]  [id number]  Patient Care Team: Einar Pheasant, MD as PCP - General (Unknown Physician Specialty) Minna Merritts, MD as Consulting Physician (Cardiology) Lloyd Huger, MD as Consulting Physician (Oncology)  CHIEF COMPLAINT: Iron deficiency anemia  INTERVAL HISTORY: Patient is a 89 year old male who was noted to have a declining hemoglobin as well as decreased iron stores on routine blood work.  He has increased weakness and fatigue, but otherwise feels well.  He has no neurologic complaints.  He denies any recent fevers or illnesses.  He has a good appetite and denies weight loss.  He has no chest pain, shortness of breath, cough, or hemoptysis.  He denies any nausea, vomiting, constipation, or diarrhea.  He has no melena or hematochezia.  He has no urinary complaints.  [name]:   Review of Systems  Constitutional:  Positive for malaise/fatigue. Negative for fever and weight loss.  Respiratory: Negative.  Negative for cough, hemoptysis and shortness of breath.   Cardiovascular: Negative.  Negative for chest pain and leg swelling.  Gastrointestinal:  Negative for abdominal pain, blood in stool and melena.  Genitourinary: Negative.  Negative for hematuria.  Musculoskeletal: Negative.  Negative for back pain.  Skin: Negative.  Negative for rash.  Neurological:  Positive for weakness. Negative for dizziness, focal weakness and headaches.  Psychiatric/Behavioral: Negative.  The patient is not nervous/anxious.     As per HPI. Otherwise, a complete review of systems is negative.  PAST MEDICAL HISTORY: Past Medical History:  Diagnosis Date   Actinic keratosis 02/01/2021   midline anterior chin    Allergic rhinitis    Basal cell carcinoma 01/29/2007   Right mid forehead. Excised:  03/24/2007, margins free.   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 Excised 07/27/2014, residual focus AFX, margins free.   Squamous cell carcinoma of skin 04/01/2007   Right forehead, 2cm above mid brow. WD SCC wtih superficial infiltration. Excised: 05/14/2007, margins free   Squamous cell carcinoma of skin 05/16/2016   Crown. WD SCC, ulcerated. Excised: 07/03/2016, residual MD SCC, deep margin involved. Excised: 07/17/2016, margins free.   TIA (transient ischemic attack) 09/13/2015   Urinary outflow obstruction    mild    PAST SURGICAL HISTORY: Past Surgical History:  Procedure Laterality Date   CATARACT EXTRACTION Bilateral    PARTIAL HIP ARTHROPLASTY  01/2019   PILONIDAL CYST EXCISION  1951   removal   ROTATOR CUFF REPAIR  1995   SKIN CANCER EXCISION     multiple   SQUAMOUS CELL CARCINOMA EXCISION     behind head   TONSILLECTOMY AND ADENOIDECTOMY  1938    FAMILY HISTORY: Family History  Problem Relation Age of Onset   Heart disease Father        heart atack   Alzheimer's disease Mother  CVA Brother     ADVANCED DIRECTIVES (Y/N):  N  HEALTH MAINTENANCE: Social History   Tobacco Use   Smoking status: Never   Smokeless tobacco: Never  Vaping Use   Vaping Use: Never used  Substance Use Topics   Alcohol use: No    Alcohol/week: 0.0 standard drinks of alcohol   Drug use: No     Colonoscopy:  PAP:  Bone density:  Lipid panel:  Allergies  Allergen Reactions   Magnesium Other  (See Comments)    Myasthenia gravis   Penicillamine Other (See Comments)    Myasthenia gravis    Current Outpatient Medications  Medication Sig Dispense Refill   Accu-Chek FastClix Lancets MISC USE 1  TO CHECK SUGARS ONCE DAILY. Dx E11.9 102 each 3   amLODipine (NORVASC) 10 MG tablet Take 1 tablet (10 mg total) by mouth daily. 90 tablet 1   aspirin 81 MG EC tablet Take 81 mg by mouth daily.     calcium-vitamin D (OSCAL WITH D) 500-200 MG-UNIT tablet Take 1 tablet by mouth 2 (two) times daily.     ferrous sulfate 325 (65 FE) MG EC tablet Take 325 mg by mouth 3 (three) times a week. Takes three times weekly. Monday Weds, Friday     finasteride (PROSCAR) 5 MG tablet Take 1 tablet by mouth once daily 90 tablet 3   folic acid (FOLVITE) 1 MG tablet Take 1 tablet (1 mg total) by mouth daily. 30 tablet 2   furosemide (LASIX) 40 MG tablet Take 1 tablet (40 mg total) by mouth daily. 30 tablet 11   gabapentin (NEURONTIN) 100 MG capsule Take 1 capsule (100 mg total) by mouth at bedtime. 30 capsule 1   glucose blood (ACCU-CHEK GUIDE) test strip USE 1 STRIP TO CHECK GLUCOSE ONCE DAILY DX E 11.9 50 each 5   lovastatin (MEVACOR) 20 MG tablet Take 1 tablet (20 mg total) by mouth at bedtime. 90 tablet 0   metFORMIN (GLUCOPHAGE-XR) 500 MG 24 hr tablet Take 1 tablet by mouth once daily with breakfast 90 tablet 0   mycophenolate (CELLCEPT) 500 MG tablet Take 1,000 mg by mouth 2 (two) times daily.     olmesartan (BENICAR) 40 MG tablet Take 1 tablet by mouth once daily 90 tablet 1   ondansetron (ZOFRAN) 4 MG tablet Take 1 tablet (4 mg total) by mouth every 8 (eight) hours as needed for nausea or vomiting. 20 tablet 0   pantoprazole (PROTONIX) 20 MG tablet Take 1 tablet (20 mg total) by mouth daily. Take 30 min to 1 hr prior to breakfast 30 tablet 1   potassium chloride SA (KLOR-CON M) 20 MEQ tablet Take 1 tablet (20 mEq total) by mouth daily. 30 tablet 2   tamsulosin (FLOMAX) 0.4 MG CAPS capsule TAKE 1 CAPSULE BY  MOUTH ONCE DAILY AFTER  SUPPER (Patient taking differently: 0.4 mg daily after breakfast. TAKE 1 CAPSULE BY MOUTH ONCE DAILY AFTER  SUPPER) 90 capsule 3   Vitamin D, Ergocalciferol, (DRISDOL) 1.25 MG (50000 UNIT) CAPS capsule Take 1 capsule (50,000 Units total) by mouth every 7 (seven) days. 12 capsule 0   No current facility-administered medications for this visit.    OBJECTIVE: Vitals:   01/31/23 1336  BP: (!) 110/52  Pulse: 70  Resp: 18  Temp: (!) 96.6 F (35.9 C)  SpO2: 100%     Body mass index is 28.35 kg/m.    ECOG FS:1 - Symptomatic but completely ambulatory  General: Well-developed, well-nourished, no acute  distress. Eyes: Pink conjunctiva, anicteric sclera. HEENT: Normocephalic, moist mucous membranes. Lungs: No audible wheezing or coughing. Heart: Regular rate and rhythm. Abdomen: Soft, nontender, no obvious distention. Musculoskeletal: No edema, cyanosis, or clubbing. Neuro: Alert, answering all questions appropriately. Cranial nerves grossly intact. Skin: No rashes or petechiae noted. Psych: Normal affect. Lymphatics: No cervical, calvicular, axillary or inguinal LAD.   LAB RESULTS:  Lab Results  Component Value Date   NA 133 (L) 01/25/2023   K 4.4 01/25/2023   CL 95 (L) 01/25/2023   CO2 28 01/25/2023   GLUCOSE 142 (H) 01/25/2023   BUN 9 01/25/2023   CREATININE 0.72 01/25/2023   CALCIUM 8.9 01/25/2023   PROT 6.2 01/18/2023   ALBUMIN 3.0 (L) 01/18/2023   AST 11 01/18/2023   ALT 9 01/18/2023   ALKPHOS 47 01/18/2023   BILITOT 0.6 01/18/2023   GFRNONAA >60 11/13/2022   GFRAA >60 09/12/2015    Lab Results  Component Value Date   WBC 9.3 01/25/2023   NEUTROABS 7.1 01/25/2023   HGB 10.0 (L) 01/25/2023   HCT 30.5 (L) 01/25/2023   MCV 89.5 01/25/2023   PLT 620.0 (H) 01/25/2023   Lab Results  Component Value Date   IRON 18 (L) 01/18/2023   TIBC 211.4 (L) 01/18/2023   IRONPCTSAT 8.5 (L) 01/18/2023   Lab Results  Component Value Date   FERRITIN  274.6 01/25/2023     STUDIES: DG Chest 2 View  Result Date: 01/16/2023 CLINICAL DATA:  Shortness of breath. EXAM: CHEST - 2 VIEW COMPARISON:  Chest x-ray 11/10/2022 and chest CT 11/06/2022 FINDINGS: Chronic bronchitic type interstitial lung changes but no infiltrates or edema. There is a small left pleural effusion and minimal bibasilar atelectasis. Moderate eventration of the right hemidiaphragm again demonstrated. The bony thorax is intact. IMPRESSION: 1. Chronic bronchitic type interstitial lung changes. 2. Small left pleural effusion and minimal bibasilar atelectasis. Electronically Signed   By: Marijo Sanes M.D.   On: 01/16/2023 12:40   US RENAL  Result Date: 01/04/2023 CLINICAL DATA:  Recurrent UTIs EXAM: RENAL / URINARY TRACT ULTRASOUND COMPLETE COMPARISON:  None Available. FINDINGS: Right Kidney: Renal measurements: 10.6 x 5.8 x 5.6 cm = volume: 183 mL. Echogenicity within normal limits. No mass or hydronephrosis visualized. Left Kidney: Renal measurements: 10.8 x 4.4 x 5.8 cm = volume: 152 mL. Echogenicity within normal limits. No mass or hydronephrosis visualized. Bladder: Appears normal for degree of bladder distention. Other: None. IMPRESSION: Normal study. Electronically Signed   By: Dorise Bullion III M.D.   On: 01/04/2023 17:00    ASSESSMENT: Iron deficiency anemia.  PLAN:    Iron deficiency anemia: Patient's hemoglobin is decreased, but stable at 10.0.  He was also noted to have a decreased total iron as well as iron saturation ratio.  Ferritin is within normal limits.  All of his other laboratory work was also either negative or within normal limits.  Patient will return to clinic 4 times over the next 2 weeks to receive 200 mg IV Venofer.  He would then return to clinic in 3 months with repeat laboratory work, further evaluation, and continuation of treatment if needed. Thrombocytosis: Likely secondary to iron deficiency.  IV Venofer as above.  Patient expressed understanding  and was in agreement with this plan. He also understands that He can call clinic at any time with any questions, concerns, or complaints.    Lloyd Huger, MD   02/01/2023 3:26 PM

## 2023-02-01 ENCOUNTER — Encounter: Payer: Self-pay | Admitting: Oncology

## 2023-02-01 DIAGNOSIS — D509 Iron deficiency anemia, unspecified: Secondary | ICD-10-CM | POA: Insufficient documentation

## 2023-02-06 ENCOUNTER — Inpatient Hospital Stay: Payer: Medicare Other | Attending: Oncology

## 2023-02-06 VITALS — BP 107/55 | HR 72 | Temp 96.3°F | Resp 16

## 2023-02-06 DIAGNOSIS — Z79899 Other long term (current) drug therapy: Secondary | ICD-10-CM | POA: Insufficient documentation

## 2023-02-06 DIAGNOSIS — D509 Iron deficiency anemia, unspecified: Secondary | ICD-10-CM

## 2023-02-06 MED ORDER — SODIUM CHLORIDE 0.9 % IV SOLN
200.0000 mg | Freq: Once | INTRAVENOUS | Status: AC
  Administered 2023-02-06: 200 mg via INTRAVENOUS
  Filled 2023-02-06: qty 10

## 2023-02-06 MED ORDER — SODIUM CHLORIDE 0.9 % IV SOLN
Freq: Once | INTRAVENOUS | Status: AC
  Filled 2023-02-06: qty 250

## 2023-02-07 DIAGNOSIS — H02054 Trichiasis without entropian left upper eyelid: Secondary | ICD-10-CM | POA: Diagnosis not present

## 2023-02-07 DIAGNOSIS — G7001 Myasthenia gravis with (acute) exacerbation: Secondary | ICD-10-CM | POA: Diagnosis not present

## 2023-02-07 DIAGNOSIS — D485 Neoplasm of uncertain behavior of skin: Secondary | ICD-10-CM | POA: Diagnosis not present

## 2023-02-08 ENCOUNTER — Other Ambulatory Visit (INDEPENDENT_AMBULATORY_CARE_PROVIDER_SITE_OTHER): Payer: Medicare Other

## 2023-02-08 ENCOUNTER — Inpatient Hospital Stay: Payer: Medicare Other

## 2023-02-08 ENCOUNTER — Other Ambulatory Visit: Payer: Medicare Other

## 2023-02-08 ENCOUNTER — Ambulatory Visit (INDEPENDENT_AMBULATORY_CARE_PROVIDER_SITE_OTHER): Payer: Medicare Other

## 2023-02-08 VITALS — BP 110/48 | HR 70 | Temp 97.1°F | Resp 16

## 2023-02-08 DIAGNOSIS — E871 Hypo-osmolality and hyponatremia: Secondary | ICD-10-CM

## 2023-02-08 DIAGNOSIS — J90 Pleural effusion, not elsewhere classified: Secondary | ICD-10-CM

## 2023-02-08 DIAGNOSIS — R0600 Dyspnea, unspecified: Secondary | ICD-10-CM | POA: Diagnosis not present

## 2023-02-08 DIAGNOSIS — D509 Iron deficiency anemia, unspecified: Secondary | ICD-10-CM

## 2023-02-08 DIAGNOSIS — Z79899 Other long term (current) drug therapy: Secondary | ICD-10-CM | POA: Diagnosis not present

## 2023-02-08 LAB — BASIC METABOLIC PANEL
BUN: 15 mg/dL (ref 6–23)
CO2: 28 mEq/L (ref 19–32)
Calcium: 9.1 mg/dL (ref 8.4–10.5)
Chloride: 96 mEq/L (ref 96–112)
Creatinine, Ser: 0.71 mg/dL (ref 0.40–1.50)
GFR: 78.62 mL/min (ref >60.00)
Glucose, Bld: 139 mg/dL — ABNORMAL HIGH (ref 70–99)
Potassium: 4.3 mEq/L (ref 3.5–5.1)
Sodium: 133 mEq/L — ABNORMAL LOW (ref 135–145)

## 2023-02-08 MED ORDER — SODIUM CHLORIDE 0.9 % IV SOLN
200.0000 mg | Freq: Once | INTRAVENOUS | Status: AC
  Administered 2023-02-08: 200 mg via INTRAVENOUS
  Filled 2023-02-08: qty 10

## 2023-02-08 MED ORDER — SODIUM CHLORIDE 0.9 % IV SOLN
Freq: Once | INTRAVENOUS | Status: AC
  Filled 2023-02-08: qty 250

## 2023-02-13 ENCOUNTER — Inpatient Hospital Stay: Payer: Medicare Other

## 2023-02-13 VITALS — BP 107/52 | HR 66 | Temp 98.4°F | Resp 16

## 2023-02-13 DIAGNOSIS — Z79899 Other long term (current) drug therapy: Secondary | ICD-10-CM | POA: Diagnosis not present

## 2023-02-13 DIAGNOSIS — D509 Iron deficiency anemia, unspecified: Secondary | ICD-10-CM | POA: Diagnosis not present

## 2023-02-13 MED ORDER — SODIUM CHLORIDE 0.9 % IV SOLN
200.0000 mg | Freq: Once | INTRAVENOUS | Status: AC
  Administered 2023-02-13: 200 mg via INTRAVENOUS
  Filled 2023-02-13: qty 200

## 2023-02-13 MED ORDER — SODIUM CHLORIDE 0.9 % IV SOLN
Freq: Once | INTRAVENOUS | Status: AC
  Filled 2023-02-13: qty 250

## 2023-02-13 NOTE — Progress Notes (Signed)
Pt has been educated and understands. Pt declined to stay 30 mins after iron infusion. Pt family member aware. VSS.

## 2023-02-14 MED FILL — Iron Sucrose Inj 20 MG/ML (Fe Equiv): INTRAVENOUS | Qty: 10 | Status: AC

## 2023-02-15 ENCOUNTER — Inpatient Hospital Stay: Payer: Medicare Other

## 2023-02-15 VITALS — BP 119/51 | HR 65 | Temp 98.2°F | Resp 16

## 2023-02-15 DIAGNOSIS — D509 Iron deficiency anemia, unspecified: Secondary | ICD-10-CM | POA: Diagnosis not present

## 2023-02-15 DIAGNOSIS — Z79899 Other long term (current) drug therapy: Secondary | ICD-10-CM | POA: Diagnosis not present

## 2023-02-15 MED ORDER — SODIUM CHLORIDE 0.9 % IV SOLN
200.0000 mg | Freq: Once | INTRAVENOUS | Status: AC
  Administered 2023-02-15: 200 mg via INTRAVENOUS
  Filled 2023-02-15: qty 200

## 2023-02-15 MED ORDER — SODIUM CHLORIDE 0.9 % IV SOLN
Freq: Once | INTRAVENOUS | Status: AC
  Filled 2023-02-15: qty 250

## 2023-02-15 NOTE — Progress Notes (Signed)
Pt declined 30 minute post observation. Educated on risks and pt verbalized understanding 

## 2023-02-22 DIAGNOSIS — G541 Lumbosacral plexus disorders: Secondary | ICD-10-CM | POA: Diagnosis not present

## 2023-02-25 ENCOUNTER — Other Ambulatory Visit: Payer: Self-pay

## 2023-02-25 ENCOUNTER — Telehealth: Payer: Self-pay

## 2023-02-25 DIAGNOSIS — G541 Lumbosacral plexus disorders: Secondary | ICD-10-CM | POA: Diagnosis not present

## 2023-02-25 MED ORDER — SODIUM CHLORIDE 1 G PO TABS: 1.0000 g | ORAL_TABLET | Freq: Every day | ORAL | 0 refills | Status: DC

## 2023-02-25 NOTE — Telephone Encounter (Signed)
If he is doing ok and taking one tablet bid, have him decrease to one tablet per day.  Will plan for recheck at next f/u appt (or lab appt)

## 2023-02-25 NOTE — Telephone Encounter (Signed)
Patient states he was put on sovium chloride 10 MG while he was in rehab.  Patient states he will be out tomorrow and would like for Dr. Dale [place] to send a prescription for this medication to his pharmacy.  Patient states his pharmacy, [facility] on [address], sent a request and they would like for Dr. Lorin Picket to approve it.

## 2023-02-25 NOTE — Telephone Encounter (Signed)
Confirmed doing ok. Sent in rx for sodium chloride 1 g- 1 tab q day. Patient is aware to decrease dose. Will recheck labs at 5/8 appt

## 2023-02-25 NOTE — Telephone Encounter (Signed)
Patient has been on the sodium chloride tablets since he came home from rehab. Last sodium was wnl. Do you want him to continue taking this or hold and see how his levels do since we adjusted his lasix? He has a f/u with you 03/13/23

## 2023-03-01 ENCOUNTER — Ambulatory Visit: Payer: Self-pay

## 2023-03-01 NOTE — Patient Outreach (Signed)
  Care Coordination   Follow Up Visit Note   03/01/2023 Name: [name] MRN: [id number] DOB: [birth year]  [name] is a 89 y.o. year old male who sees Dale La Porte, MD for primary care. I spoke with  [name] by phone today.  What matters to the patients health and wellness today?  Patient reports iron infusions have ended. Patient reports mild fatigue. He states overall he is about the same. Patient denies any additional symptoms. Patient reports he has obtained scales and is checking his weight daily.  He reports today's weight is 190 lbs.     Goals Addressed             This Visit's Progress    Management of health conditions       Interventions Today    Flowsheet Row Most Recent Value  Chronic Disease   Chronic disease during today's visit Hypertension (HTN), Other  [Anemia/ hyponatremia]  General Interventions   General Interventions Discussed/Reviewed General Interventions Reviewed, Doctor Visits, Labs  [evaluation of current treatment plan for HTN, anemia/ hyponatremia and patients adherence to plan as established by provider. Assessed for fatigue.  Inquired if scales obtained.]  Doctor Visits Discussed/Reviewed Doctor Visits Reviewed  [name] scheduled / upcoming provider visits.]  Pharmacy Interventions   Pharmacy Dicussed/Reviewed Pharmacy Topics Reviewed  [medications reviewed and compliance discussed.]              SDOH assessments and interventions completed:  No     Care Coordination Interventions:  Yes, provided   Follow up plan: Follow up call scheduled for 04/10/23    Encounter Outcome:  Pt. Visit Completed   [name] RN,BSN,CCM [facility] Care Coordination [phone] direct line

## 2023-03-13 ENCOUNTER — Ambulatory Visit (INDEPENDENT_AMBULATORY_CARE_PROVIDER_SITE_OTHER): Payer: Medicare Other | Admitting: Internal Medicine

## 2023-03-13 VITALS — BP 116/70 | HR 65 | Temp 98.0°F | Resp 16 | Ht 71.0 in | Wt 191.0 lb

## 2023-03-13 DIAGNOSIS — E7800 Pure hypercholesterolemia, unspecified: Secondary | ICD-10-CM

## 2023-03-13 DIAGNOSIS — D649 Anemia, unspecified: Secondary | ICD-10-CM | POA: Diagnosis not present

## 2023-03-13 DIAGNOSIS — G7000 Myasthenia gravis without (acute) exacerbation: Secondary | ICD-10-CM | POA: Diagnosis not present

## 2023-03-13 DIAGNOSIS — J90 Pleural effusion, not elsewhere classified: Secondary | ICD-10-CM

## 2023-03-13 DIAGNOSIS — D509 Iron deficiency anemia, unspecified: Secondary | ICD-10-CM

## 2023-03-13 DIAGNOSIS — E1165 Type 2 diabetes mellitus with hyperglycemia: Secondary | ICD-10-CM | POA: Diagnosis not present

## 2023-03-13 DIAGNOSIS — I10 Essential (primary) hypertension: Secondary | ICD-10-CM | POA: Diagnosis not present

## 2023-03-13 DIAGNOSIS — E871 Hypo-osmolality and hyponatremia: Secondary | ICD-10-CM

## 2023-03-13 DIAGNOSIS — N400 Enlarged prostate without lower urinary tract symptoms: Secondary | ICD-10-CM

## 2023-03-13 LAB — HEPATIC FUNCTION PANEL
ALT: 7 U/L (ref 0–53)
AST: 11 U/L (ref 0–37)
Albumin: 3.6 g/dL (ref 3.5–5.2)
Alkaline Phosphatase: 41 U/L (ref 39–117)
Bilirubin, Direct: 0.1 mg/dL (ref 0.0–0.3)
Total Bilirubin: 0.5 mg/dL (ref 0.2–1.2)
Total Protein: 6.6 g/dL (ref 6.0–8.3)

## 2023-03-13 LAB — BASIC METABOLIC PANEL
BUN: 15 mg/dL (ref 6–23)
CO2: 26 mEq/L (ref 19–32)
Calcium: 8.9 mg/dL (ref 8.4–10.5)
Chloride: 103 mEq/L (ref 96–112)
Creatinine, Ser: 0.68 mg/dL (ref 0.40–1.50)
GFR: 79.6 mL/min (ref >60.00)
Glucose, Bld: 90 mg/dL (ref 70–99)
Potassium: 4.5 mEq/L (ref 3.5–5.1)
Sodium: 136 mEq/L (ref 135–145)

## 2023-03-13 LAB — HEMOGLOBIN A1C: Hgb A1c MFr Bld: 6.5 % (ref 4.6–6.5)

## 2023-03-13 MED ORDER — AMLODIPINE BESYLATE 10 MG PO TABS: 10.0000 mg | ORAL_TABLET | Freq: Every day | ORAL | 1 refills | Status: DC

## 2023-03-13 MED ORDER — POTASSIUM CHLORIDE CRYS ER 20 MEQ PO TBCR: 20.0000 meq | EXTENDED_RELEASE_TABLET | Freq: Every day | ORAL | 2 refills | Status: DC

## 2023-03-13 MED ORDER — LOVASTATIN 20 MG PO TABS: 20.0000 mg | ORAL_TABLET | Freq: Every day | ORAL | 0 refills | Status: DC

## 2023-03-13 MED ORDER — SODIUM CHLORIDE 1 G PO TABS: 1.0000 g | ORAL_TABLET | Freq: Every day | ORAL | 0 refills | Status: DC

## 2023-03-13 NOTE — Progress Notes (Signed)
Subjective:    Patient ID: [name], male    DOB: May 22, 1926, 89 y.o.   MRN: [id number]  Patient here for  Chief Complaint  Patient presents with   Medical Management of Chronic Issues    HPI Here to follow up regarding hypertension, hyponatremia and diabetes.  He is accompanied by his d-n-l.  History obtained from both of them. EMG - [facility] - There is electrodiagnostic evidence of a right greater than left lumbosacral plexopahty. There also is electrodiagnostic evidence of a background sensorimotor polyneuropathy.  Seeing Dr Orlie Dakin for IDA.  Receiving IV venofer. Have been following his sodium level.  Last check stable - 133.  Decreased sodium tablets to q day.  Plan for recheck today.  Taking lasix.  MRI scheduled for 04/2023.    Past Medical History:  Diagnosis Date   Actinic keratosis 02/01/2021   midline anterior chin    Allergic rhinitis    Basal cell carcinoma 01/29/2007   Right mid forehead. Excised: 03/24/2007, margins free.   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 Excised 07/27/2014, residual focus AFX, margins free.   Squamous cell carcinoma of skin 04/01/2007   Right forehead, 2cm above mid brow. WD SCC wtih superficial infiltration. Excised: 05/14/2007, margins free   Squamous cell carcinoma of skin 05/16/2016   Crown. WD SCC, ulcerated. Excised: 07/03/2016, residual MD SCC, deep margin involved. Excised: 07/17/2016, margins free.   TIA (transient ischemic attack) 09/13/2015    Urinary outflow obstruction    mild   Past Surgical History:  Procedure Laterality Date   CATARACT EXTRACTION Bilateral    PARTIAL HIP ARTHROPLASTY  01/2019   PILONIDAL CYST EXCISION  1951   removal   ROTATOR CUFF REPAIR  1995   SKIN CANCER EXCISION     multiple   SQUAMOUS CELL CARCINOMA EXCISION     behind head   TONSILLECTOMY AND ADENOIDECTOMY  1938   Family History  Problem Relation Age of Onset   Heart disease Father        heart atack   Alzheimer's disease Mother    CVA Brother    Social History   Socioeconomic History   Marital status: Widowed    Spouse name: Not on file   Number of children: Not on file   Years of education: Not on file   Highest education level: Not on file  Occupational History   Not on file  Tobacco Use   Smoking status: Never   Smokeless tobacco: Never  Vaping Use   Vaping Use: Never used  Substance and Sexual Activity   Alcohol use: No    Alcohol/week: 0.0 standard drinks of alcohol   Drug use: No   Sexual activity: Not Currently    Birth control/protection: None  Other Topics Concern   Not on file  Social History Narrative   Retired, married. Walks everyday         Social Determinants of Corporate investment banker  Strain: Low Risk  (06/02/2019)   Overall Financial Resource Strain (CARDIA)    Difficulty of Paying Living Expenses: Not hard at all  Food Insecurity: No Food Insecurity (01/31/2023)   Hunger Vital Sign    Worried About Running Out of Food in the Last Year: Never true    Ran Out of Food in the Last Year: Never true  Transportation Needs: No Transportation Needs (12/17/2022)   PRAPARE - Administrator, Civil Service (Medical): No    Lack of Transportation (Non-Medical): No  Physical Activity: Insufficiently Active (06/02/2019)   Exercise Vital Sign    Days of Exercise per Week: 2 days    Minutes of Exercise per Session: 60 min  Stress: No Stress Concern Present (06/02/2019)   [employer] of  Occupational Health - Occupational Stress Questionnaire    Feeling of Stress : Not at all  Social Connections: Unknown (06/02/2019)   Social Connection and Isolation Panel [NHANES]    Frequency of Communication with Friends and Family: Not asked    Frequency of Social Gatherings with Friends and Family: Not on file    Attends Religious Services: Not on file    Active Member of Clubs or Organizations: Not on file    Attends Banker Meetings: Not on file    Marital Status: Not on file     Review of Systems  Constitutional:  Negative for appetite change and unexpected weight change.  HENT:  Negative for congestion and sinus pressure.   Respiratory:  Negative for cough and chest tightness.        Breathing stable.   Cardiovascular:  Negative for chest pain and palpitations.  Gastrointestinal:  Negative for abdominal pain, diarrhea, nausea and vomiting.  Genitourinary:  Negative for difficulty urinating and dysuria.  Musculoskeletal:  Negative for joint swelling and myalgias.  Skin:  Negative for color change and rash.  Neurological:  Negative for dizziness and headaches.  Psychiatric/Behavioral:  Negative for agitation and dysphoric mood.        Objective:     BP 116/70   Pulse 65   Temp 98 F (36.7 C)   Resp 16   Ht 5\' 11"  (1.803 m)   Wt 191 lb (86.6 kg)   SpO2 98%   BMI 26.64 kg/m  Wt Readings from Last 3 Encounters:  03/13/23 191 lb (86.6 kg)  01/31/23 192 lb (87.1 kg)  01/25/23 192 lb (87.1 kg)    Physical Exam Vitals reviewed.  Constitutional:      General: He is not in acute distress.    Appearance: Normal appearance. He is well-developed.  HENT:     Head: Normocephalic and atraumatic.     Right Ear: External ear normal.     Left Ear: External ear normal.  Eyes:     General: No scleral icterus.       Right eye: No discharge.        Left eye: No discharge.     Conjunctiva/sclera: Conjunctivae normal.  Cardiovascular:     Rate and Rhythm:  Normal rate and regular rhythm.  Pulmonary:     Effort: Pulmonary effort is normal. No respiratory distress.     Breath sounds: Normal breath sounds.  Abdominal:     General: Bowel sounds are normal.     Palpations: Abdomen is soft.     Tenderness: There is no abdominal tenderness.  Musculoskeletal:        General: No swelling or tenderness.  Cervical back: Neck supple. No tenderness.  Lymphadenopathy:     Cervical: No cervical adenopathy.  Skin:    Findings: No erythema or rash.  Neurological:     Mental Status: He is alert.  Psychiatric:        Mood and Affect: Mood normal.        Behavior: Behavior normal.      Outpatient Encounter Medications as of 03/13/2023  Medication Sig   Accu-Chek FastClix Lancets MISC USE 1  TO CHECK SUGARS ONCE DAILY. Dx E11.9   amLODipine (NORVASC) 10 MG tablet Take 1 tablet (10 mg total) by mouth daily.   aspirin 81 MG EC tablet Take 81 mg by mouth daily.   calcium-vitamin D (OSCAL WITH D) 500-200 MG-UNIT tablet Take 1 tablet by mouth 2 (two) times daily.   ferrous sulfate 325 (65 FE) MG EC tablet Take 325 mg by mouth 3 (three) times a week. Takes three times weekly. Monday Weds, Friday   finasteride (PROSCAR) 5 MG tablet Take 1 tablet by mouth once daily   folic acid (FOLVITE) 1 MG tablet Take 1 tablet (1 mg total) by mouth daily.   furosemide (LASIX) 40 MG tablet Take 1 tablet (40 mg total) by mouth daily.   gabapentin (NEURONTIN) 100 MG capsule Take 1 capsule (100 mg total) by mouth at bedtime.   glucose blood (ACCU-CHEK GUIDE) test strip USE 1 STRIP TO CHECK GLUCOSE ONCE DAILY DX E 11.9   lovastatin (MEVACOR) 20 MG tablet Take 1 tablet (20 mg total) by mouth at bedtime.   metFORMIN (GLUCOPHAGE-XR) 500 MG 24 hr tablet Take 1 tablet by mouth once daily with breakfast   mycophenolate (CELLCEPT) 500 MG tablet Take 1,000 mg by mouth 2 (two) times daily.   olmesartan (BENICAR) 40 MG tablet Take 1 tablet by mouth once daily   ondansetron (ZOFRAN) 4  MG tablet Take 1 tablet (4 mg total) by mouth every 8 (eight) hours as needed for nausea or vomiting.   potassium chloride SA (KLOR-CON M) 20 MEQ tablet Take 1 tablet (20 mEq total) by mouth daily.   sodium chloride 1 g tablet Take 1 tablet (1 g total) by mouth daily.   tamsulosin (FLOMAX) 0.4 MG CAPS capsule TAKE 1 CAPSULE BY MOUTH ONCE DAILY AFTER  SUPPER (Patient taking differently: 0.4 mg daily after breakfast. TAKE 1 CAPSULE BY MOUTH ONCE DAILY AFTER  SUPPER)   [DISCONTINUED] amLODipine (NORVASC) 10 MG tablet Take 1 tablet (10 mg total) by mouth daily.   [DISCONTINUED] furosemide (LASIX) 40 MG tablet Take 1 tablet (40 mg total) by mouth daily.   [DISCONTINUED] lovastatin (MEVACOR) 20 MG tablet Take 1 tablet (20 mg total) by mouth at bedtime.   [DISCONTINUED] pantoprazole (PROTONIX) 20 MG tablet Take 1 tablet (20 mg total) by mouth daily. Take 30 min to 1 hr prior to breakfast   [DISCONTINUED] potassium chloride SA (KLOR-CON M) 20 MEQ tablet Take 1 tablet (20 mEq total) by mouth daily.   [DISCONTINUED] sodium chloride 1 g tablet Take 1 tablet (1 g total) by mouth daily.   No facility-administered encounter medications on file as of 03/13/2023.     Lab Results  Component Value Date   WBC 6.5 03/22/2023   HGB 11.5 (L) 03/22/2023   HCT 34.6 (L) 03/22/2023   PLT 420.0 (H) 03/22/2023   GLUCOSE 157 (H) 03/22/2023   CHOL 105 03/22/2023   TRIG 121.0 03/22/2023   HDL 37.10 (L) 03/22/2023   LDLCALC 44 03/22/2023   ALT  6 03/22/2023   AST 11 03/22/2023   NA 138 03/29/2023   K 4.4 03/22/2023   CL 96 03/22/2023   CREATININE 0.83 03/22/2023   BUN 19 03/22/2023   CO2 28 03/22/2023   TSH 3.25 12/12/2022   PSA 0.02 (L) 04/16/2018   HGBA1C 6.5 03/22/2023   MICROALBUR 11.4 (H) 05/18/2022    US RENAL  Result Date: 01/04/2023 CLINICAL DATA:  Recurrent UTIs EXAM: RENAL / URINARY TRACT ULTRASOUND COMPLETE COMPARISON:  None Available. FINDINGS: Right Kidney: Renal measurements: 10.6 x 5.8 x 5.6 cm =  volume: 183 mL. Echogenicity within normal limits. No mass or hydronephrosis visualized. Left Kidney: Renal measurements: 10.8 x 4.4 x 5.8 cm = volume: 152 mL. Echogenicity within normal limits. No mass or hydronephrosis visualized. Bladder: Appears normal for degree of bladder distention. Other: None. IMPRESSION: Normal study. Electronically Signed   By: Gerome Sam III M.D.   On: 01/04/2023 17:00       Assessment & Plan:  Type 2 diabetes mellitus with hyperglycemia, without long-term current use of insulin (HCC) Assessment & Plan: Low carb diet and exercise.  Follow met b and a1c.    Orders: -     Basic metabolic panel -     Hemoglobin A1c  HYPERTENSION, BENIGN Assessment & Plan: On amlodipine and benicar.  Follow pressures.  Follow metabolic panel.    Hyponatremia Assessment & Plan: On lasix 40mg  q day now. Taking sodium tablets.  Recheck sodium today.    Hypercholesterolemia Assessment & Plan: On lovastatin.  Low cholesterol diet and exercise.  Follow lipid panel and liver function tests.    Orders: -     Hepatic function panel  Anemia, unspecified type Assessment & Plan: Persistent anemia.  Seeing Dr Orlie Dakin for IDA.  Receiving IV venofer.    Benign prostatic hyperplasia, unspecified whether lower urinary tract symptoms present Assessment & Plan: Dr Richardo Hanks - flomax and finasteride.  Saw Urology 12/2022 - tamsulosin. No acute urinary issues reported.  Follow.    Iron deficiency anemia, unspecified iron deficiency anemia type Assessment & Plan: Seeing Dr Orlie Dakin for IDA.  Receiving IV venofer.    Myasthenia gravis [facility]) Assessment & Plan: Followed by neurology.  Stable.  Continue cellcept.  Follow cbc.    Pleural effusion on left Assessment & Plan: 02/08/23 - CXR left pleural effusion resolved.    Other orders -     amLODIPine Besylate; Take 1 tablet (10 mg total) by mouth daily.  Dispense: 90 tablet; Refill: 1 -     Lovastatin; Take 1 tablet (20 mg  total) by mouth at bedtime.  Dispense: 90 tablet; Refill: 0 -     Potassium Chloride Crys ER; Take 1 tablet (20 mEq total) by mouth daily.  Dispense: 30 tablet; Refill: 2 -     Sodium Chloride; Take 1 tablet (1 g total) by mouth daily.  Dispense: 30 tablet; Refill: 0 -     Furosemide; Take 1 tablet (40 mg total) by mouth daily.  Dispense: 30 tablet; Refill: 0     Dale Vayas, MD

## 2023-03-14 ENCOUNTER — Other Ambulatory Visit: Payer: Self-pay

## 2023-03-14 DIAGNOSIS — E871 Hypo-osmolality and hyponatremia: Secondary | ICD-10-CM

## 2023-03-14 MED ORDER — FUROSEMIDE 40 MG PO TABS: 40.0000 mg | ORAL_TABLET | Freq: Every day | ORAL | 0 refills | Status: DC

## 2023-03-22 ENCOUNTER — Encounter: Payer: Self-pay | Admitting: Internal Medicine

## 2023-03-22 ENCOUNTER — Other Ambulatory Visit (INDEPENDENT_AMBULATORY_CARE_PROVIDER_SITE_OTHER): Payer: Medicare Other

## 2023-03-22 DIAGNOSIS — E871 Hypo-osmolality and hyponatremia: Secondary | ICD-10-CM | POA: Diagnosis not present

## 2023-03-22 DIAGNOSIS — E1165 Type 2 diabetes mellitus with hyperglycemia: Secondary | ICD-10-CM

## 2023-03-22 DIAGNOSIS — E7800 Pure hypercholesterolemia, unspecified: Secondary | ICD-10-CM | POA: Diagnosis not present

## 2023-03-22 DIAGNOSIS — D649 Anemia, unspecified: Secondary | ICD-10-CM

## 2023-03-22 LAB — LIPID PANEL
Cholesterol: 105 mg/dL (ref 0–200)
HDL: 37.1 mg/dL — ABNORMAL LOW (ref >39.00)
LDL Cholesterol: 44 mg/dL (ref 0–99)
NonHDL: 68.3
Total CHOL/HDL Ratio: 3
Triglycerides: 121 mg/dL (ref 0.0–149.0)
VLDL: 24.2 mg/dL (ref 0.0–40.0)

## 2023-03-22 LAB — BASIC METABOLIC PANEL
BUN: 19 mg/dL (ref 6–23)
CO2: 28 mEq/L (ref 19–32)
Calcium: 9.2 mg/dL (ref 8.4–10.5)
Chloride: 96 mEq/L (ref 96–112)
Creatinine, Ser: 0.83 mg/dL (ref 0.40–1.50)
GFR: 74.94 mL/min (ref >60.00)
Glucose, Bld: 157 mg/dL — ABNORMAL HIGH (ref 70–99)
Potassium: 4.4 mEq/L (ref 3.5–5.1)
Sodium: 133 mEq/L — ABNORMAL LOW (ref 135–145)

## 2023-03-22 LAB — HEPATIC FUNCTION PANEL
ALT: 6 U/L (ref 0–53)
AST: 11 U/L (ref 0–37)
Albumin: 3.7 g/dL (ref 3.5–5.2)
Alkaline Phosphatase: 42 U/L (ref 39–117)
Bilirubin, Direct: 0.2 mg/dL (ref 0.0–0.3)
Total Bilirubin: 0.7 mg/dL (ref 0.2–1.2)
Total Protein: 6.9 g/dL (ref 6.0–8.3)

## 2023-03-22 LAB — CBC WITH DIFFERENTIAL/PLATELET
Basophils Absolute: 0.1 10*3/uL (ref 0.0–0.1)
Basophils Relative: 1 % (ref 0.0–3.0)
Eosinophils Absolute: 0.1 10*3/uL (ref 0.0–0.7)
Eosinophils Relative: 1.7 % (ref 0.0–5.0)
HCT: 34.6 % — ABNORMAL LOW (ref 39.0–52.0)
Hemoglobin: 11.5 g/dL — ABNORMAL LOW (ref 13.0–17.0)
Lymphocytes Relative: 18.7 % (ref 12.0–46.0)
Lymphs Abs: 1.2 10*3/uL (ref 0.7–4.0)
MCHC: 33.3 g/dL (ref 30.0–36.0)
MCV: 91.5 fl (ref 78.0–100.0)
Monocytes Absolute: 0.6 10*3/uL (ref 0.1–1.0)
Monocytes Relative: 8.6 % (ref 3.0–12.0)
Neutro Abs: 4.5 10*3/uL (ref 1.4–7.7)
Neutrophils Relative %: 70 % (ref 43.0–77.0)
Platelets: 420 10*3/uL — ABNORMAL HIGH (ref 150.0–400.0)
RBC: 3.79 Mil/uL — ABNORMAL LOW (ref 4.22–5.81)
RDW: 14.5 % (ref 11.5–15.5)
WBC: 6.5 10*3/uL (ref 4.0–10.5)

## 2023-03-22 LAB — HEMOGLOBIN A1C: Hgb A1c MFr Bld: 6.5 % (ref 4.6–6.5)

## 2023-03-25 ENCOUNTER — Other Ambulatory Visit: Payer: Self-pay

## 2023-03-25 DIAGNOSIS — E871 Hypo-osmolality and hyponatremia: Secondary | ICD-10-CM

## 2023-03-25 NOTE — Telephone Encounter (Signed)
See lab result note.  Thanks. 

## 2023-03-28 ENCOUNTER — Other Ambulatory Visit: Payer: Self-pay | Admitting: Family

## 2023-03-28 ENCOUNTER — Other Ambulatory Visit: Payer: Self-pay | Admitting: Internal Medicine

## 2023-03-28 DIAGNOSIS — R110 Nausea: Secondary | ICD-10-CM

## 2023-03-29 ENCOUNTER — Other Ambulatory Visit (INDEPENDENT_AMBULATORY_CARE_PROVIDER_SITE_OTHER): Payer: Medicare Other

## 2023-03-29 ENCOUNTER — Encounter: Payer: Self-pay | Admitting: Internal Medicine

## 2023-03-29 DIAGNOSIS — E871 Hypo-osmolality and hyponatremia: Secondary | ICD-10-CM | POA: Diagnosis not present

## 2023-03-29 LAB — SODIUM: Sodium: 138 mEq/L (ref 135–145)

## 2023-03-29 NOTE — Telephone Encounter (Signed)
Pt would like future prescriptions sent in for 90 days.

## 2023-03-31 ENCOUNTER — Encounter: Payer: Self-pay | Admitting: Internal Medicine

## 2023-03-31 NOTE — Assessment & Plan Note (Signed)
On lasix 40mg  q day now. Taking sodium tablets.  Recheck sodium today.

## 2023-03-31 NOTE — Assessment & Plan Note (Signed)
02/08/23 - CXR left pleural effusion resolved.

## 2023-03-31 NOTE — Assessment & Plan Note (Signed)
Seeing Dr Orlie Dakin for IDA.  Receiving IV venofer.

## 2023-03-31 NOTE — Assessment & Plan Note (Signed)
Persistent anemia.  Seeing Dr Orlie Dakin for IDA.  Receiving IV venofer.

## 2023-03-31 NOTE — Assessment & Plan Note (Signed)
Dr Richardo Hanks - flomax and finasteride.  Saw Urology 12/2022 - tamsulosin. No acute urinary issues reported.  Follow.

## 2023-03-31 NOTE — Assessment & Plan Note (Signed)
Followed by neurology.  Stable.  Continue cellcept.  Follow cbc.  ?

## 2023-03-31 NOTE — Assessment & Plan Note (Signed)
On amlodipine and benicar.  Follow pressures.  Follow metabolic panel.  

## 2023-03-31 NOTE — Assessment & Plan Note (Signed)
On lovastatin.  Low cholesterol diet and exercise.  Follow lipid panel and liver function tests.   

## 2023-03-31 NOTE — Assessment & Plan Note (Signed)
Low carb diet and exercise.  Follow met b and a1c.   

## 2023-04-04 ENCOUNTER — Other Ambulatory Visit: Payer: Self-pay | Admitting: Internal Medicine

## 2023-04-10 ENCOUNTER — Ambulatory Visit: Payer: Self-pay

## 2023-04-10 NOTE — Patient Outreach (Signed)
  Care Coordination   Follow Up Visit Note   04/10/2023 Name: [name] MRN: [id number] DOB: [name], 1926  [name] is a 89 y.o. year old male who sees Justin Superior, MD for primary care. I spoke with  [name] by phone today.  What matters to the patients health and wellness today?  Patient states he is doing well. He states he continues to monitor his blood pressures daily.  Reports today's blood pressure is 115/55.  He denies any new or ongoing symptoms.  He states he continues to take sodium tablets 1 per day as prescribed.  Patient states his most recent sodium lab was in normal limits. Patient states overall he is doing well.  Patient verbally agrees care coordination goals met and ok to close to services.     Goals Addressed             This Visit's Progress    COMPLETED: Management of health conditions       Interventions Today    Flowsheet Row Most Recent Value  Chronic Disease   Chronic disease during today's visit Hypertension (HTN), Other  [Anemia, hyponatremia]  General Interventions   General Interventions Discussed/Reviewed General Interventions Reviewed, Doctor Visits  [evaluation of current treatment plan for HTN, anemia, hyponatremia and patients adherence to plan as established by provider.  Assessed blood pressure readings.]  Doctor Visits Discussed/Reviewed Doctor Visits Reviewed  [name] upcoming provider visits.]  Education Interventions   Education Provided --  [Encouraged patient to continues to monitor blood pressures daily, follow low carbohydrate diet and remain active daily.]  Pharmacy Interventions   Pharmacy Dicussed/Reviewed Pharmacy Topics Reviewed  [medications reviewed and compliance discussed.]              SDOH assessments and interventions completed:  No     Care Coordination Interventions:  Yes, provided   Follow up plan: No further intervention required.   Encounter Outcome:  Pt. Visit Completed    Justin Ina RN,BSN,CCM [facility] Care Coordination [phone] direct line

## 2023-04-18 ENCOUNTER — Other Ambulatory Visit: Payer: Self-pay | Admitting: Internal Medicine

## 2023-04-18 DIAGNOSIS — E1165 Type 2 diabetes mellitus with hyperglycemia: Secondary | ICD-10-CM

## 2023-04-25 DIAGNOSIS — M4807 Spinal stenosis, lumbosacral region: Secondary | ICD-10-CM | POA: Diagnosis not present

## 2023-04-25 DIAGNOSIS — M48061 Spinal stenosis, lumbar region without neurogenic claudication: Secondary | ICD-10-CM | POA: Diagnosis not present

## 2023-04-25 DIAGNOSIS — M21371 Foot drop, right foot: Secondary | ICD-10-CM | POA: Diagnosis not present

## 2023-04-25 DIAGNOSIS — M47816 Spondylosis without myelopathy or radiculopathy, lumbar region: Secondary | ICD-10-CM | POA: Diagnosis not present

## 2023-04-26 ENCOUNTER — Encounter: Payer: Self-pay | Admitting: Oncology

## 2023-04-29 ENCOUNTER — Encounter: Payer: Self-pay | Admitting: Internal Medicine

## 2023-04-29 ENCOUNTER — Other Ambulatory Visit: Payer: Self-pay | Admitting: Internal Medicine

## 2023-04-29 NOTE — Telephone Encounter (Signed)
Rx ok'd for sodium bicarb and folic acid.

## 2023-04-29 NOTE — Telephone Encounter (Signed)
Called pt to clarify medication but was unable to LVM

## 2023-05-01 NOTE — Telephone Encounter (Signed)
Pt is aware.  

## 2023-05-03 ENCOUNTER — Encounter: Payer: Self-pay | Admitting: Podiatry

## 2023-05-03 ENCOUNTER — Ambulatory Visit (INDEPENDENT_AMBULATORY_CARE_PROVIDER_SITE_OTHER): Payer: Medicare Other | Admitting: Podiatry

## 2023-05-03 ENCOUNTER — Encounter: Payer: Self-pay | Admitting: Oncology

## 2023-05-03 ENCOUNTER — Other Ambulatory Visit: Payer: Self-pay | Admitting: Internal Medicine

## 2023-05-03 VITALS — BP 120/52 | HR 73

## 2023-05-03 DIAGNOSIS — M79674 Pain in right toe(s): Secondary | ICD-10-CM | POA: Diagnosis not present

## 2023-05-03 DIAGNOSIS — B351 Tinea unguium: Secondary | ICD-10-CM

## 2023-05-03 DIAGNOSIS — M79675 Pain in left toe(s): Secondary | ICD-10-CM

## 2023-05-03 DIAGNOSIS — E1142 Type 2 diabetes mellitus with diabetic polyneuropathy: Secondary | ICD-10-CM | POA: Diagnosis not present

## 2023-05-06 ENCOUNTER — Other Ambulatory Visit: Payer: Medicare Other

## 2023-05-07 ENCOUNTER — Ambulatory Visit: Payer: Medicare Other | Admitting: Oncology

## 2023-05-07 ENCOUNTER — Ambulatory Visit: Payer: Medicare Other

## 2023-05-07 ENCOUNTER — Encounter: Payer: Self-pay | Admitting: Podiatry

## 2023-05-07 NOTE — Progress Notes (Signed)
  Subjective:  Patient ID: [name], male    DOB: [birth year],  MRN: [id number]  [name] presents to clinic today for at risk foot care with history of diabetic neuropathy and painful thick toenails that are difficult to trim. Pain interferes with ambulation. Aggravating factors include wearing enclosed shoe gear. Pain is relieved with periodic professional debridement. Patient wears AFOs for bilateral drop foot. Chief Complaint  Patient presents with   Nail Problem    "Trim"  Dr. Dale [place] - 03/13/2023, glucose today - 114 mg/dl    New problem(s): None.   PCP is Dale Camargo, MD.  Allergies  Allergen Reactions   Magnesium Other (See Comments)    Myasthenia gravis   Penicillamine Other (See Comments)    Myasthenia gravis    Review of Systems: Negative except as noted in the HPI.  Objective: No changes noted in today's physical examination. Vitals:   05/03/23 1052  BP: (!) 120/52  Pulse: [address]   [name] is a pleasant 89 y.o. male WD, WN in NAD. AAO x 3.  Vascular Examination: CFT <3 seconds b/l. DP/PT pulses faintly palpable b/l. Skin temperature gradient warm to warm b/l. No pain with calf compression. No ischemia or gangrene. No cyanosis or clubbing noted b/l.    Neurological Examination: Pt has subjective symptoms of neuropathy. Protective sensation diminished with 10 gram monofilament b/l lower extremities.  Dermatological Examination: Pedal skin warm and supple b/l.   No open wounds. No interdigital macerations.  Toenails 1-5 b/l thick, discolored, elongated with subungual debris and pain on dorsal palpation.    No hyperkeratotic nor porokeratotic lesions present on today's visit.  Musculoskeletal Examination: Muscle strength 5/5 to b/l LE. Dropfoot b/l lower extremities. Wearing AFO on right lower extremity only today.  Radiographs: None   Last A1c:      Latest Ref Rng & Units 03/22/2023   11:12 AM 03/13/2023   10:55 AM  12/12/2022   12:24 PM 08/23/2022   12:06 PM 05/18/2022    7:53 AM  Hemoglobin A1C  Hemoglobin-A1c 4.6 - 6.5 % 6.5  6.5  6.6  7.0  7.0    Assessment/Plan: 1. Pain due to onychomycosis of toenails of both feet   2. Diabetic polyneuropathy associated with type 2 diabetes mellitus (HCC)   -Patient was evaluated and treated. All patient's and/or POA's questions/concerns answered on today's visit. -Continue foot and shoe inspections daily. Monitor blood glucose per PCP/Endocrinologist's recommendations. -Patient to continue soft, supportive shoe gear daily. -Toenails 1-5 bilaterally were debrided in length and girth with sterile nail nippers and dremel. Pinpoint bleeding of L 2nd toe addressed with Lumicain Hemostatic Solution, cleansed with alcohol. Triple antibiotic ointment applied. Patient/careigver instructed to apply triple antibiotic ointment once daily for 7 days. -Patient/POA to call should there be question/concern in the interim.   Return in about 3 months (around 08/03/2023).  Freddie Breech, DPM

## 2023-05-08 ENCOUNTER — Other Ambulatory Visit: Payer: Medicare Other

## 2023-05-10 ENCOUNTER — Other Ambulatory Visit: Payer: Self-pay

## 2023-05-10 ENCOUNTER — Ambulatory Visit: Payer: Medicare Other

## 2023-05-10 ENCOUNTER — Inpatient Hospital Stay: Payer: Medicare Other | Attending: Oncology

## 2023-05-10 ENCOUNTER — Ambulatory Visit: Payer: Medicare Other | Admitting: Oncology

## 2023-05-10 DIAGNOSIS — D509 Iron deficiency anemia, unspecified: Secondary | ICD-10-CM | POA: Insufficient documentation

## 2023-05-10 DIAGNOSIS — Z8673 Personal history of transient ischemic attack (TIA), and cerebral infarction without residual deficits: Secondary | ICD-10-CM | POA: Diagnosis not present

## 2023-05-10 DIAGNOSIS — E119 Type 2 diabetes mellitus without complications: Secondary | ICD-10-CM | POA: Diagnosis not present

## 2023-05-10 DIAGNOSIS — Z85828 Personal history of other malignant neoplasm of skin: Secondary | ICD-10-CM | POA: Insufficient documentation

## 2023-05-10 DIAGNOSIS — L570 Actinic keratosis: Secondary | ICD-10-CM | POA: Diagnosis not present

## 2023-05-10 DIAGNOSIS — Z7982 Long term (current) use of aspirin: Secondary | ICD-10-CM | POA: Insufficient documentation

## 2023-05-10 DIAGNOSIS — Z79624 Long term (current) use of inhibitors of nucleotide synthesis: Secondary | ICD-10-CM | POA: Diagnosis not present

## 2023-05-10 DIAGNOSIS — E7800 Pure hypercholesterolemia, unspecified: Secondary | ICD-10-CM | POA: Insufficient documentation

## 2023-05-10 DIAGNOSIS — I10 Essential (primary) hypertension: Secondary | ICD-10-CM | POA: Insufficient documentation

## 2023-05-10 DIAGNOSIS — R531 Weakness: Secondary | ICD-10-CM | POA: Diagnosis not present

## 2023-05-10 DIAGNOSIS — D75839 Thrombocytosis, unspecified: Secondary | ICD-10-CM | POA: Diagnosis not present

## 2023-05-10 DIAGNOSIS — Z79899 Other long term (current) drug therapy: Secondary | ICD-10-CM | POA: Diagnosis not present

## 2023-05-10 DIAGNOSIS — N400 Enlarged prostate without lower urinary tract symptoms: Secondary | ICD-10-CM | POA: Insufficient documentation

## 2023-05-10 DIAGNOSIS — R5383 Other fatigue: Secondary | ICD-10-CM | POA: Insufficient documentation

## 2023-05-10 DIAGNOSIS — Z7984 Long term (current) use of oral hypoglycemic drugs: Secondary | ICD-10-CM | POA: Diagnosis not present

## 2023-05-10 DIAGNOSIS — K219 Gastro-esophageal reflux disease without esophagitis: Secondary | ICD-10-CM | POA: Insufficient documentation

## 2023-05-10 LAB — CBC WITH DIFFERENTIAL/PLATELET
Abs Immature Granulocytes: 0.02 10*3/uL (ref 0.00–0.07)
Basophils Absolute: 0.1 10*3/uL (ref 0.0–0.1)
Basophils Relative: 1 %
Eosinophils Absolute: 0.1 10*3/uL (ref 0.0–0.5)
Eosinophils Relative: 2 %
HCT: 34.9 % — ABNORMAL LOW (ref 39.0–52.0)
Hemoglobin: 11.2 g/dL — ABNORMAL LOW (ref 13.0–17.0)
Immature Granulocytes: 0 %
Lymphocytes Relative: 22 %
Lymphs Abs: 1.6 10*3/uL (ref 0.7–4.0)
MCH: 30.5 pg (ref 26.0–34.0)
MCHC: 32.1 g/dL (ref 30.0–36.0)
MCV: 95.1 fL (ref 80.0–100.0)
Monocytes Absolute: 0.5 10*3/uL (ref 0.1–1.0)
Monocytes Relative: 7 %
Neutro Abs: 5.1 10*3/uL (ref 1.7–7.7)
Neutrophils Relative %: 68 %
Platelets: 400 10*3/uL (ref 150–400)
RBC: 3.67 MIL/uL — ABNORMAL LOW (ref 4.22–5.81)
RDW: 13.3 % (ref 11.5–15.5)
WBC: 7.4 10*3/uL (ref 4.0–10.5)
nRBC: 0 % (ref 0.0–0.2)

## 2023-05-10 LAB — COMPREHENSIVE METABOLIC PANEL
ALT: 11 U/L (ref 0–44)
AST: 16 U/L (ref 15–41)
Albumin: 3.7 g/dL (ref 3.5–5.0)
Alkaline Phosphatase: 38 U/L (ref 38–126)
Anion gap: 11 (ref 5–15)
BUN: 26 mg/dL — ABNORMAL HIGH (ref 8–23)
CO2: 24 mmol/L (ref 22–32)
Calcium: 9.3 mg/dL (ref 8.9–10.3)
Chloride: 102 mmol/L (ref 98–111)
Creatinine, Ser: 0.94 mg/dL (ref 0.61–1.24)
GFR, Estimated: >60 mL/min (ref >60)
Glucose, Bld: 141 mg/dL — ABNORMAL HIGH (ref 70–99)
Potassium: 5 mmol/L (ref 3.5–5.1)
Sodium: 137 mmol/L (ref 135–145)
Total Bilirubin: 0.5 mg/dL (ref 0.3–1.2)
Total Protein: 7.2 g/dL (ref 6.5–8.1)

## 2023-05-10 LAB — IRON AND TIBC
Iron: 102 ug/dL (ref 45–182)
Saturation Ratios: 46 % — ABNORMAL HIGH (ref 17.9–39.5)
TIBC: 223 ug/dL — ABNORMAL LOW (ref 250–450)
UIBC: 121 ug/dL

## 2023-05-10 LAB — FERRITIN: Ferritin: 491 ng/mL — ABNORMAL HIGH (ref 24–336)

## 2023-05-16 ENCOUNTER — Ambulatory Visit: Payer: Medicare Other | Admitting: Oncology

## 2023-05-16 ENCOUNTER — Ambulatory Visit: Payer: Medicare Other

## 2023-05-16 DIAGNOSIS — G7001 Myasthenia gravis with (acute) exacerbation: Secondary | ICD-10-CM | POA: Diagnosis not present

## 2023-05-16 DIAGNOSIS — D485 Neoplasm of uncertain behavior of skin: Secondary | ICD-10-CM | POA: Diagnosis not present

## 2023-05-16 DIAGNOSIS — H02054 Trichiasis without entropian left upper eyelid: Secondary | ICD-10-CM | POA: Diagnosis not present

## 2023-05-24 ENCOUNTER — Other Ambulatory Visit: Payer: Self-pay

## 2023-05-24 DIAGNOSIS — E119 Type 2 diabetes mellitus without complications: Secondary | ICD-10-CM

## 2023-05-24 MED ORDER — FOLIC ACID 1 MG PO TABS: 1.0000 mg | ORAL_TABLET | Freq: Every day | ORAL | 1 refills | Status: DC

## 2023-05-24 MED ORDER — METFORMIN HCL ER 500 MG PO TB24
ORAL_TABLET | ORAL | 1 refills | Status: DC
Start: 2023-05-24 — End: 2023-11-08

## 2023-05-24 MED ORDER — SODIUM CHLORIDE 1 G PO TABS: 1.0000 g | ORAL_TABLET | Freq: Every day | ORAL | 1 refills | Status: DC

## 2023-05-29 ENCOUNTER — Other Ambulatory Visit: Payer: Self-pay

## 2023-05-29 DIAGNOSIS — D509 Iron deficiency anemia, unspecified: Secondary | ICD-10-CM

## 2023-05-29 MED FILL — Iron Sucrose Inj 20 MG/ML (Fe Equiv): INTRAVENOUS | Qty: 10 | Status: AC

## 2023-05-30 ENCOUNTER — Encounter: Payer: Self-pay | Admitting: Nurse Practitioner

## 2023-05-30 ENCOUNTER — Inpatient Hospital Stay: Payer: Medicare Other

## 2023-05-30 ENCOUNTER — Inpatient Hospital Stay (HOSPITAL_BASED_OUTPATIENT_CLINIC_OR_DEPARTMENT_OTHER): Payer: Medicare Other | Admitting: Nurse Practitioner

## 2023-05-30 ENCOUNTER — Other Ambulatory Visit: Payer: Self-pay | Admitting: Internal Medicine

## 2023-05-30 VITALS — BP 100/47 | HR 73 | Temp 97.6°F | Resp 19 | Wt 186.0 lb

## 2023-05-30 DIAGNOSIS — D509 Iron deficiency anemia, unspecified: Secondary | ICD-10-CM

## 2023-05-30 DIAGNOSIS — N400 Enlarged prostate without lower urinary tract symptoms: Secondary | ICD-10-CM | POA: Diagnosis not present

## 2023-05-30 DIAGNOSIS — R5383 Other fatigue: Secondary | ICD-10-CM | POA: Diagnosis not present

## 2023-05-30 DIAGNOSIS — L570 Actinic keratosis: Secondary | ICD-10-CM | POA: Diagnosis not present

## 2023-05-30 DIAGNOSIS — D75839 Thrombocytosis, unspecified: Secondary | ICD-10-CM | POA: Diagnosis not present

## 2023-05-30 DIAGNOSIS — R531 Weakness: Secondary | ICD-10-CM | POA: Diagnosis not present

## 2023-05-30 NOTE — Progress Notes (Signed)
[facility]  [phone] [phone]  ID: [name] OB: 21-Jan-1926  MR#: [id number]  MVH#:[id number]  Patient Care Team: Dale [place], MD as PCP - General (Unknown Physician Specialty) Antonieta Iba, MD as Consulting Physician (Cardiology) Jeralyn Ruths, MD as Consulting Physician (Oncology)  CHIEF COMPLAINT: Iron deficiency anemia  INTERVAL HISTORY: Patient is a 89 year old male with history of iron and b12 anemia who returns to clinic for discussion of lab results and further evaluation. His weakness and fatigue is somewhat improved. He continues to deny black or bloody stools. Is taking oral iron 3 times a week per pcp. Denies any neurologic complaints. Denies recent fevers or illnesses. Denies any easy bleeding or bruising. No melena or hematochezia. No pica or restless leg. Reports good appetite and denies weight loss. Denies chest pain. Denies any nausea, vomiting, constipation, or diarrhea. Denies urinary complaints. [name]:   Review of Systems  Constitutional:  Positive for malaise/fatigue. Negative for fever and weight loss.  Respiratory: Negative.  Negative for cough, hemoptysis and shortness of breath.   Cardiovascular: Negative.  Negative for chest pain and leg swelling.  Gastrointestinal:  Negative for abdominal pain, blood in stool and melena.  Genitourinary: Negative.  Negative for hematuria.  Musculoskeletal: Negative.  Negative for back pain.  Skin: Negative.  Negative for rash.  Neurological:  Positive for weakness. Negative for dizziness, focal weakness and headaches.  Psychiatric/Behavioral: Negative.  The patient is not nervous/anxious.   As per HPI. Otherwise, a complete review of systems is negative.  PAST MEDICAL HISTORY: Past Medical History:  Diagnosis Date   Actinic keratosis 02/01/2021   midline anterior chin    Allergic rhinitis     Basal cell carcinoma 01/29/2007   Right mid forehead. Excised: 03/24/2007, margins free.   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 Excised 07/27/2014, residual focus AFX, margins free.   Squamous cell carcinoma of skin 04/01/2007   Right forehead, 2cm above mid brow. WD SCC wtih superficial infiltration. Excised: 05/14/2007, margins free   Squamous cell carcinoma of skin 05/16/2016   Crown. WD SCC, ulcerated. Excised: 07/03/2016, residual MD SCC, deep margin involved. Excised: 07/17/2016, margins free.   TIA (transient ischemic attack) 09/13/2015   Urinary outflow obstruction    mild    PAST SURGICAL HISTORY: Past Surgical History:  Procedure Laterality Date   CATARACT EXTRACTION Bilateral    PARTIAL HIP ARTHROPLASTY  01/2019   PILONIDAL CYST EXCISION  1951   removal   ROTATOR CUFF REPAIR  1995   SKIN CANCER EXCISION     multiple   SQUAMOUS CELL CARCINOMA EXCISION     behind head   TONSILLECTOMY AND ADENOIDECTOMY  1938    FAMILY HISTORY: Family History  Problem Relation Age of Onset   Heart disease Father        heart atack   Alzheimer's disease Mother    CVA Brother  ADVANCED DIRECTIVES (Y/N):  N  HEALTH MAINTENANCE: Social History   Tobacco Use   Smoking status: Never   Smokeless tobacco: Never  Vaping Use   Vaping status: Never Used  Substance Use Topics   Alcohol use: No    Alcohol/week: 0.0 standard drinks of alcohol   Drug use: No     Colonoscopy:  PAP:  Bone  density:  Lipid panel:  Allergies  Allergen Reactions   Magnesium Other (See Comments)    Myasthenia gravis   Penicillamine Other (See Comments)    Myasthenia gravis    Current Outpatient Medications  Medication Sig Dispense Refill   Accu-Chek FastClix Lancets MISC USE 1  TO CHECK GLUCOSE ONCE DAILY 102 each 0   amLODipine (NORVASC) 10 MG tablet Take 1 tablet (10 mg total) by mouth daily. 90 tablet 1   aspirin 81 MG EC tablet Take 81 mg by mouth daily.     calcium-vitamin D (OSCAL WITH D) 500-200 MG-UNIT tablet Take 1 tablet by mouth 2 (two) times daily.     ferrous sulfate 325 (65 FE) MG EC tablet Take 325 mg by mouth 3 (three) times a week. Takes three times weekly. Monday Weds, Friday     finasteride (PROSCAR) 5 MG tablet Take 1 tablet by mouth once daily 90 tablet 3   folic acid (FOLVITE) 1 MG tablet Take 1 tablet (1 mg total) by mouth daily. 90 tablet 1   furosemide (LASIX) 40 MG tablet Take 1 tablet by mouth once daily 30 tablet 0   gabapentin (NEURONTIN) 100 MG capsule Take 1 capsule (100 mg total) by mouth at bedtime. 30 capsule 1   glucose blood (ACCU-CHEK GUIDE) test strip USE 1 STRIP TO CHECK GLUCOSE ONCE DAILY DX E 11.9 50 each 5   lovastatin (MEVACOR) 20 MG tablet Take 1 tablet (20 mg total) by mouth at bedtime. 90 tablet 0   metFORMIN (GLUCOPHAGE-XR) 500 MG 24 hr tablet Take 1 tablet by mouth once daily with breakfast 90 tablet 1   mycophenolate (CELLCEPT) 500 MG tablet Take 1,000 mg by mouth 2 (two) times daily.     olmesartan (BENICAR) 40 MG tablet Take 1 tablet by mouth once daily 90 tablet 1   ondansetron (ZOFRAN) 4 MG tablet Take 1 tablet (4 mg total) by mouth every 8 (eight) hours as needed for nausea or vomiting. 20 tablet 0   pantoprazole (PROTONIX) 20 MG tablet TAKE 1 TABLET BY MOUTH ONCE DAILY (TAKE  30  MINUTES  TO  1  HOUR  PRIOR  TO  BREAKFAST) 60 tablet 0   potassium chloride SA (KLOR-CON M) 20 MEQ tablet Take 1 tablet (20 mEq total) by mouth daily. 30 tablet  2   sodium chloride 1 g tablet Take 1 tablet (1 g total) by mouth daily. 90 tablet 1   tamsulosin (FLOMAX) 0.4 MG CAPS capsule TAKE 1 CAPSULE BY MOUTH ONCE DAILY AFTER  SUPPER (Patient taking differently: 0.4 mg daily after breakfast. TAKE 1 CAPSULE BY MOUTH ONCE DAILY AFTER  SUPPER) 90 capsule 3   No current facility-administered medications for this visit.    OBJECTIVE: Vitals:   05/30/23 1319  BP: (!) 100/47  Pulse: 73  Resp: 19  Temp: 97.6 F (36.4 C)  SpO2: 99%     Body mass index is 25.94 kg/m.    ECOG FS:1 - Symptomatic but completely ambulatory  General: Frail appearing. No acute distress. Accompanied by daughter in law. Wheelchair.  Eyes: Pink conjunctiva, anicteric sclera.  Lungs: No audible wheezing or coughing Heart: Regular rate and rhythm.  Abdomen: Soft, nontender, nondistended.  Musculoskeletal: No edema, cyanosis, or clubbing. Neuro: Alert, answering all questions appropriately.  Skin: No rashes or petechiae noted. Psych: Normal affect.   LAB RESULTS: Lab Results  Component Value Date   NA 137 05/10/2023   K 5.0 05/10/2023   CL 102 05/10/2023   CO2 24 05/10/2023   GLUCOSE 141 (H) 05/10/2023   BUN 26 (H) 05/10/2023   CREATININE 0.94 05/10/2023   CALCIUM 9.3 05/10/2023   PROT 7.2 05/10/2023   ALBUMIN 3.7 05/10/2023   AST 16 05/10/2023   ALT 11 05/10/2023   ALKPHOS 38 05/10/2023   BILITOT 0.5 05/10/2023   GFRNONAA >60 05/10/2023   GFRAA >60 09/12/2015    Lab Results  Component Value Date   WBC 7.4 05/10/2023   NEUTROABS 5.1 05/10/2023   HGB 11.2 (L) 05/10/2023   HCT 34.9 (L) 05/10/2023   MCV 95.1 05/10/2023   PLT 400 05/10/2023   Lab Results  Component Value Date   IRON 102 05/10/2023   TIBC 223 (L) 05/10/2023   IRONPCTSAT 46 (H) 05/10/2023   Lab Results  Component Value Date   FERRITIN 491 (H) 05/10/2023     STUDIES: No results found.  ASSESSMENT: Iron deficiency anemia.  PLAN:    Iron deficiency anemia: Etiology unclear-  malabsorption? Diet? Bleeding? Patient's hemoglobin had decreased to 9-10. Iron sat decreased. He received venofer 200 mg x 4. Tolerated well. Today, hemoglobin has improved to 11.2, ferritin elevated and iron sat elevated. He can hold oral iron.  No role for IV iron today. If counts drop, consider additional work up. Return to clinic in 3 months with repeat laboratory work, further evaluation, and continuation of treatment if needed. Thrombocytosis: Likely secondary to iron deficiency.  Now resolved.   Patient expressed understanding and was in agreement with this plan. He also understands that He can call clinic at any time with any questions, concerns, or complaints.    Alinda Dooms, NP   05/30/2023

## 2023-06-04 DIAGNOSIS — G7000 Myasthenia gravis without (acute) exacerbation: Secondary | ICD-10-CM | POA: Diagnosis not present

## 2023-06-04 DIAGNOSIS — M5136 Other intervertebral disc degeneration, lumbar region: Secondary | ICD-10-CM | POA: Diagnosis not present

## 2023-06-05 ENCOUNTER — Encounter (INDEPENDENT_AMBULATORY_CARE_PROVIDER_SITE_OTHER): Payer: Self-pay

## 2023-06-06 ENCOUNTER — Other Ambulatory Visit: Payer: Self-pay | Admitting: Internal Medicine

## 2023-06-06 DIAGNOSIS — R110 Nausea: Secondary | ICD-10-CM

## 2023-06-10 ENCOUNTER — Other Ambulatory Visit: Payer: Self-pay

## 2023-06-10 ENCOUNTER — Other Ambulatory Visit: Payer: Self-pay | Admitting: Internal Medicine

## 2023-06-10 ENCOUNTER — Telehealth: Payer: Self-pay | Admitting: Internal Medicine

## 2023-06-10 DIAGNOSIS — I10 Essential (primary) hypertension: Secondary | ICD-10-CM

## 2023-06-10 MED ORDER — OLMESARTAN MEDOXOMIL 40 MG PO TABS
ORAL_TABLET | ORAL | 1 refills | Status: DC
Start: 2023-06-10 — End: 2023-11-08

## 2023-06-10 NOTE — Telephone Encounter (Signed)
Prescription Request  06/10/2023  LOV: 03/13/2023  What is the name of the medication or equipment? olmesartan (BENICAR) 40 MG tablet. Would like a 90 day supply. PATIENT HAS BEEN OUR OF MEDICATION SINCE THURSDAY.   Have you contacted your pharmacy to request a refill? Yes   Which pharmacy would you like this sent to?  [facility] [address], [place] - [address] [place] [address] Phone: [phone] Fax: [phone]    Patient notified that their request is being sent to the clinical staff for review and that they should receive a response within 2 business days.   Please advise at [facility] [phone]

## 2023-06-10 NOTE — Telephone Encounter (Signed)
I have sent in medication and patient is aware.

## 2023-06-14 ENCOUNTER — Encounter: Payer: Self-pay | Admitting: Internal Medicine

## 2023-06-14 ENCOUNTER — Other Ambulatory Visit: Payer: Self-pay | Admitting: Internal Medicine

## 2023-06-14 ENCOUNTER — Other Ambulatory Visit: Payer: Self-pay

## 2023-06-14 DIAGNOSIS — E1165 Type 2 diabetes mellitus with hyperglycemia: Secondary | ICD-10-CM

## 2023-06-14 MED ORDER — ACCU-CHEK GUIDE VI STRP
ORAL_STRIP | 11 refills | Status: DC
Start: 2023-06-14 — End: 2023-06-18

## 2023-06-14 NOTE — Telephone Encounter (Signed)
Pt called in stating that pharmacy needs diagnosis code in order for pt to get his meds. It was hard for me to hear what pt was saying, but he said to let Bethann Berkshire knows.

## 2023-06-14 NOTE — Telephone Encounter (Signed)
Folic acid was filled. Ok to send in his potassium 20 meq q day for a 90 day supply.?

## 2023-06-14 NOTE — Telephone Encounter (Signed)
Ok to refill 90 day.  Keep 06/21/23 appt

## 2023-06-14 NOTE — Telephone Encounter (Signed)
See my chart message

## 2023-06-18 ENCOUNTER — Other Ambulatory Visit: Payer: Self-pay

## 2023-06-18 DIAGNOSIS — E1165 Type 2 diabetes mellitus with hyperglycemia: Secondary | ICD-10-CM

## 2023-06-18 MED ORDER — ACCU-CHEK GUIDE VI STRP
ORAL_STRIP | 11 refills | Status: DC
Start: 2023-06-18 — End: 2023-12-20

## 2023-06-20 DIAGNOSIS — M21371 Foot drop, right foot: Secondary | ICD-10-CM | POA: Diagnosis not present

## 2023-06-21 ENCOUNTER — Ambulatory Visit (INDEPENDENT_AMBULATORY_CARE_PROVIDER_SITE_OTHER): Payer: Medicare Other | Admitting: Internal Medicine

## 2023-06-21 ENCOUNTER — Encounter: Payer: Self-pay | Admitting: Internal Medicine

## 2023-06-21 VITALS — BP 114/62 | HR 98 | Temp 97.9°F | Resp 16 | Ht 71.0 in | Wt 187.0 lb

## 2023-06-21 DIAGNOSIS — E1165 Type 2 diabetes mellitus with hyperglycemia: Secondary | ICD-10-CM

## 2023-06-21 DIAGNOSIS — E871 Hypo-osmolality and hyponatremia: Secondary | ICD-10-CM

## 2023-06-21 DIAGNOSIS — E1142 Type 2 diabetes mellitus with diabetic polyneuropathy: Secondary | ICD-10-CM | POA: Diagnosis not present

## 2023-06-21 DIAGNOSIS — R110 Nausea: Secondary | ICD-10-CM

## 2023-06-21 DIAGNOSIS — G7000 Myasthenia gravis without (acute) exacerbation: Secondary | ICD-10-CM

## 2023-06-21 DIAGNOSIS — N400 Enlarged prostate without lower urinary tract symptoms: Secondary | ICD-10-CM | POA: Diagnosis not present

## 2023-06-21 DIAGNOSIS — D649 Anemia, unspecified: Secondary | ICD-10-CM | POA: Diagnosis not present

## 2023-06-21 DIAGNOSIS — M21379 Foot drop, unspecified foot: Secondary | ICD-10-CM

## 2023-06-21 DIAGNOSIS — I10 Essential (primary) hypertension: Secondary | ICD-10-CM | POA: Diagnosis not present

## 2023-06-21 DIAGNOSIS — E7800 Pure hypercholesterolemia, unspecified: Secondary | ICD-10-CM | POA: Diagnosis not present

## 2023-06-21 MED ORDER — POTASSIUM CHLORIDE CRYS ER 20 MEQ PO TBCR: 20.0000 meq | EXTENDED_RELEASE_TABLET | Freq: Every day | ORAL | 1 refills | Status: DC

## 2023-06-21 MED ORDER — GABAPENTIN 100 MG PO CAPS: 100.0000 mg | ORAL_CAPSULE | Freq: Every day | ORAL | 1 refills | Status: DC

## 2023-06-21 MED ORDER — PANTOPRAZOLE SODIUM 20 MG PO TBEC
20.0000 mg | DELAYED_RELEASE_TABLET | Freq: Every day | ORAL | 3 refills | Status: DC
Start: 2023-06-21 — End: 2023-11-08

## 2023-06-21 MED ORDER — AMLODIPINE BESYLATE 10 MG PO TABS: 10.0000 mg | ORAL_TABLET | Freq: Every day | ORAL | 1 refills | Status: DC

## 2023-06-21 NOTE — Assessment & Plan Note (Signed)
On lovastatin.  Low cholesterol diet and exercise.  Follow lipid panel and liver function tests.   

## 2023-06-21 NOTE — Assessment & Plan Note (Signed)
Appears to be stable currently.  On gabapentin.

## 2023-06-21 NOTE — Assessment & Plan Note (Signed)
Dr Richardo Hanks - flomax and finasteride.  Saw Urology 12/2022 - tamsulosin. No acute urinary issues reported.  Follow.

## 2023-06-21 NOTE — Assessment & Plan Note (Signed)
Followed by neurology.  Stable.  Continue cellcept.  Follow cbc.  

## 2023-06-21 NOTE — Assessment & Plan Note (Signed)
Low carb diet and exercise.  Follow met b and a1c.   

## 2023-06-21 NOTE — Progress Notes (Signed)
Subjective:    Patient ID: [name], male    DOB: Oct 22, 1926, 89 y.o.   MRN: [id number]  Patient here for  Chief Complaint  Patient presents with   Medical Management of Chronic Issues    HPI Here to follow up regarding hypertension, hyponatremia and diabetes. He is accompanied by his d-n-l. History obtained from both of them.  EMG - [facility] - There is electrodiagnostic evidence of a right greater than left lumbosacral plexopahty. There also is electrodiagnostic evidence of a background sensorimotor polyneuropathy. Had f/u with hematology 05/30/23.  Hgb 11.2 and ferritin elevated and iron sat elevated.  Oral iron on hold.  Recommended f/u in 3 months. Neurology f/u 06/04/23 - f/u myasthenia.  Recommended continue cellcept. Follow up with NSU - recommended PT with electrical stimulation and if referred radicular pain - would be a candidate for epidural or other injections including ablation.  Discussed with him today.  He is wanting to hold on PT at this time.  States he is doing well.  No chest pain or sob reported.  No cough or congestion.  No abdominal pain.  Bowels stable.     Past Medical History:  Diagnosis Date   Actinic keratosis 02/01/2021   midline anterior chin    Allergic rhinitis    Basal cell carcinoma 01/29/2007   Right mid forehead. Excised: 03/24/2007, margins free.   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 Excised 07/27/2014, residual focus AFX, margins free.   Squamous cell  carcinoma of skin 04/01/2007   Right forehead, 2cm above mid brow. WD SCC wtih superficial infiltration. Excised: 05/14/2007, margins free   Squamous cell carcinoma of skin 05/16/2016   Crown. WD SCC, ulcerated. Excised: 07/03/2016, residual MD SCC, deep margin involved. Excised: 07/17/2016, margins free.   TIA (transient ischemic attack) 09/13/2015   Urinary outflow obstruction    mild   Past Surgical History:  Procedure Laterality Date   CATARACT EXTRACTION Bilateral    PARTIAL HIP ARTHROPLASTY  01/2019   PILONIDAL CYST EXCISION  1951   removal   ROTATOR CUFF REPAIR  1995   SKIN CANCER EXCISION     multiple   SQUAMOUS CELL CARCINOMA EXCISION     behind head   TONSILLECTOMY AND ADENOIDECTOMY  1938   Family History  Problem Relation Age of Onset   Heart disease Father        heart atack   Alzheimer's disease Mother    CVA Brother    Social History   Socioeconomic History   Marital status: Widowed    Spouse name: Not on file   Number of children: Not on file   Years of education: Not on file   Highest education level: Not on file  Occupational History   Not on file  Tobacco Use   Smoking status: Never   Smokeless tobacco: Never  Vaping Use   Vaping status: Never Used  Substance and Sexual Activity   Alcohol use:  No    Alcohol/week: 0.0 standard drinks of alcohol   Drug use: No   Sexual activity: Not Currently    Birth control/protection: None  Other Topics Concern   Not on file  Social History Narrative   Retired, married. Walks everyday         Social Determinants of Health   Financial Resource Strain: Low Risk  (06/20/2023)   Received from [facility]   Overall Financial Resource Strain (CARDIA)    Difficulty of Paying Living Expenses: Not hard at all  Food Insecurity: No Food Insecurity (06/20/2023)   Received from [facility]   Hunger Vital Sign    Worried About Running Out of Food in the Last Year: Never true    Ran  Out of Food in the Last Year: Never true  Transportation Needs: No Transportation Needs (06/20/2023)   Received from [facility] - Transportation    In the past 12 months, has lack of transportation kept you from medical appointments or from getting medications?: No    Lack of Transportation (Non-Medical): No  Physical Activity: Insufficiently Active (06/02/2019)   Exercise Vital Sign    Days of Exercise per Week: 2 days    Minutes of Exercise per Session: 60 min  Stress: No Stress Concern Present (06/02/2019)   [employer] of Occupational Health - Occupational Stress Questionnaire    Feeling of Stress : Not at all  Social Connections: Unknown (06/02/2019)   Social Connection and Isolation Panel [NHANES]    Frequency of Communication with Friends and Family: Not asked    Frequency of Social Gatherings with Friends and Family: Not on file    Attends Religious Services: Not on file    Active Member of Clubs or Organizations: Not on file    Attends Banker Meetings: Not on file    Marital Status: Not on file     Review of Systems  Constitutional:  Negative for appetite change and unexpected weight change.  HENT:  Negative for congestion and sinus pain.   Respiratory:  Negative for cough and chest tightness.        Breathing stable.   Cardiovascular:  Negative for chest pain and palpitations.       No increased swelling - stable.   Gastrointestinal:  Negative for abdominal pain, diarrhea, nausea and vomiting.  Genitourinary:  Negative for difficulty urinating and dysuria.  Musculoskeletal:  Negative for joint swelling and myalgias.  Skin:  Negative for color change and rash.  Neurological:  Negative for dizziness and headaches.  Psychiatric/Behavioral:  Negative for agitation and dysphoric mood.        Objective:     BP 114/62   Pulse 98   Temp 97.9 F (36.6 C)   Resp 16   Ht 5\' 11"  (1.803 m)   Wt 187 lb (84.8 kg)   SpO2 98%    BMI 26.08 kg/m  Wt Readings from Last 3 Encounters:  06/21/23 187 lb (84.8 kg)  05/30/23 186 lb (84.4 kg)  03/13/23 191 lb (86.6 kg)    Physical Exam Vitals reviewed.  Constitutional:      General: He is not in acute distress.    Appearance: Normal appearance. He is well-developed.  HENT:     Head: Normocephalic and atraumatic.     Right Ear: External ear normal.     Left Ear: External ear normal.  Eyes:     General: No scleral icterus.  Right eye: No discharge.        Left eye: No discharge.     Conjunctiva/sclera: Conjunctivae normal.  Cardiovascular:     Rate and Rhythm: Normal rate and regular rhythm.  Pulmonary:     Effort: Pulmonary effort is normal. No respiratory distress.     Breath sounds: Normal breath sounds.  Abdominal:     General: Bowel sounds are normal.     Palpations: Abdomen is soft.     Tenderness: There is no abdominal tenderness.  Musculoskeletal:        General: No tenderness.     Cervical back: Neck supple. No tenderness.     Comments: Pedal and ankle edema - stable.   Lymphadenopathy:     Cervical: No cervical adenopathy.  Skin:    Findings: No erythema or rash.  Neurological:     Mental Status: He is alert.  Psychiatric:        Mood and Affect: Mood normal.        Behavior: Behavior normal.      Outpatient Encounter Medications as of 06/21/2023  Medication Sig   Accu-Chek FastClix Lancets MISC USE 1  TO CHECK GLUCOSE ONCE DAILY   amLODipine (NORVASC) 10 MG tablet Take 1 tablet (10 mg total) by mouth daily.   aspirin 81 MG EC tablet Take 81 mg by mouth daily.   calcium-vitamin D (OSCAL WITH D) 500-200 MG-UNIT tablet Take 1 tablet by mouth 2 (two) times daily.   ferrous sulfate 325 (65 FE) MG EC tablet Take 325 mg by mouth 3 (three) times a week. Takes three times weekly. Monday Weds, Friday   finasteride (PROSCAR) 5 MG tablet Take 1 tablet by mouth once daily   folic acid (FOLVITE) 1 MG tablet Take 1 tablet (1 mg total) by mouth  daily.   furosemide (LASIX) 40 MG tablet Take 1 tablet by mouth once daily   gabapentin (NEURONTIN) 100 MG capsule Take 1 capsule (100 mg total) by mouth at bedtime.   glucose blood (ACCU-CHEK GUIDE) test strip USE 1 STRIP TO CHECK GLUCOSE ONCE DAILY. DX E11.9   lovastatin (MEVACOR) 20 MG tablet TAKE 1 TABLET BY MOUTH AT BEDTIME   metFORMIN (GLUCOPHAGE-XR) 500 MG 24 hr tablet Take 1 tablet by mouth once daily with breakfast   mycophenolate (CELLCEPT) 500 MG tablet Take 1,000 mg by mouth 2 (two) times daily.   olmesartan (BENICAR) 40 MG tablet Take 1 tablet by mouth once daily   ondansetron (ZOFRAN) 4 MG tablet Take 1 tablet (4 mg total) by mouth every 8 (eight) hours as needed for nausea or vomiting.   pantoprazole (PROTONIX) 20 MG tablet Take 1 tablet (20 mg total) by mouth daily.   potassium chloride SA (KLOR-CON M20) 20 MEQ tablet Take 1 tablet (20 mEq total) by mouth daily.   sodium chloride 1 g tablet Take 1 tablet (1 g total) by mouth daily.   tamsulosin (FLOMAX) 0.4 MG CAPS capsule TAKE 1 CAPSULE BY MOUTH ONCE DAILY AFTER  SUPPER (Patient taking differently: 0.4 mg daily after breakfast. TAKE 1 CAPSULE BY MOUTH ONCE DAILY AFTER  SUPPER)   [DISCONTINUED] amLODipine (NORVASC) 10 MG tablet Take 1 tablet (10 mg total) by mouth daily.   [DISCONTINUED] gabapentin (NEURONTIN) 100 MG capsule Take 1 capsule (100 mg total) by mouth at bedtime.   [DISCONTINUED] pantoprazole (PROTONIX) 20 MG tablet TAKE 1 TABLET BY MOUTH ONCE DAILY 30 MINUTES TO 1 HOUR PRIOR TO BREAKFAST   [DISCONTINUED] potassium chloride SA (KLOR-CON  M20) 20 MEQ tablet Take 1 tablet by mouth once daily   No facility-administered encounter medications on file as of 06/21/2023.     Lab Results  Component Value Date   WBC 7.4 05/10/2023   HGB 11.2 (L) 05/10/2023   HCT 34.9 (L) 05/10/2023   PLT 400 05/10/2023   GLUCOSE 141 (H) 05/10/2023   CHOL 105 03/22/2023   TRIG 121.0 03/22/2023   HDL 37.10 (L) 03/22/2023   LDLCALC 44  03/22/2023   ALT 11 05/10/2023   AST 16 05/10/2023   NA 137 05/10/2023   K 5.0 05/10/2023   CL 102 05/10/2023   CREATININE 0.94 05/10/2023   BUN 26 (H) 05/10/2023   CO2 24 05/10/2023   TSH 3.25 12/12/2022   PSA 0.02 (L) 04/16/2018   HGBA1C 6.5 03/22/2023   MICROALBUR 11.4 (H) 05/18/2022    US RENAL  Result Date: 01/04/2023 CLINICAL DATA:  Recurrent UTIs EXAM: RENAL / URINARY TRACT ULTRASOUND COMPLETE COMPARISON:  None Available. FINDINGS: Right Kidney: Renal measurements: 10.6 x 5.8 x 5.6 cm = volume: 183 mL. Echogenicity within normal limits. No mass or hydronephrosis visualized. Left Kidney: Renal measurements: 10.8 x 4.4 x 5.8 cm = volume: 152 mL. Echogenicity within normal limits. No mass or hydronephrosis visualized. Bladder: Appears normal for degree of bladder distention. Other: None. IMPRESSION: Normal study. Electronically Signed   By: Gerome Sam III M.D.   On: 01/04/2023 17:00       Assessment & Plan:  Anemia, unspecified type Assessment & Plan: Persistent anemia.  Seeing Dr Orlie Dakin for IDA.  Received IV venofer.  Most recent check improved as outlined.  Oral iron on hold.  Has f/u in 08/2023.    Nausea -     Pantoprazole Sodium; Take 1 tablet (20 mg total) by mouth daily.  Dispense: 90 tablet; Refill: 3  Benign prostatic hyperplasia, unspecified whether lower urinary tract symptoms present Assessment & Plan: Dr Richardo Hanks - flomax and finasteride.  Saw Urology 12/2022 - tamsulosin. No acute urinary issues reported.  Follow.    Type 2 diabetes mellitus with hyperglycemia, without long-term current use of insulin (HCC) Assessment & Plan: Low carb diet and exercise.  Follow met b and a1c.     Diabetic polyneuropathy associated with type 2 diabetes mellitus (HCC) Assessment & Plan: Appears to be stable currently.  On gabapentin.    Foot-drop, unspecified laterality Assessment & Plan: Follow up with NSU - recommended PT with electrical stimulation and if referred  radicular pain - would be a candidate for epidural or other injections including ablation.  Discussed with him today.  He is wanting to hold on PT at this time.     Hypercholesterolemia Assessment & Plan: On lovastatin.  Low cholesterol diet and exercise.  Follow lipid panel and liver function tests.     HYPERTENSION, BENIGN Assessment & Plan: On amlodipine and benicar.  Follow pressures.  Follow metabolic panel.    Hyponatremia Assessment & Plan: On lasix 40mg  q day now. Taking sodium tablets.  Recheck sodium with next labs.    Myasthenia gravis [facility]) Assessment & Plan: Followed by neurology.  Stable.  Continue cellcept.  Follow cbc.    Other orders -     amLODIPine Besylate; Take 1 tablet (10 mg total) by mouth daily.  Dispense: 90 tablet; Refill: 1 -     Potassium Chloride Crys ER; Take 1 tablet (20 mEq total) by mouth daily.  Dispense: 90 tablet; Refill: 1 -     Gabapentin; Take  1 capsule (100 mg total) by mouth at bedtime.  Dispense: 90 capsule; Refill: 1     Dale , MD

## 2023-06-21 NOTE — Assessment & Plan Note (Signed)
On amlodipine and benicar.  Follow pressures.  Follow metabolic panel.

## 2023-06-21 NOTE — Assessment & Plan Note (Signed)
Persistent anemia.  Seeing Dr Orlie Dakin for IDA.  Received IV venofer.  Most recent check improved as outlined.  Oral iron on hold.  Has f/u in 08/2023.

## 2023-06-21 NOTE — Assessment & Plan Note (Signed)
Follow up with NSU - recommended PT with electrical stimulation and if referred radicular pain - would be a candidate for epidural or other injections including ablation.  Discussed with him today.  He is wanting to hold on PT at this time.

## 2023-06-21 NOTE — Assessment & Plan Note (Addendum)
On lasix 40mg  q day now. Taking sodium tablets.  Recheck sodium with next labs.

## 2023-07-25 ENCOUNTER — Ambulatory Visit: Payer: Medicare Other | Admitting: Dermatology

## 2023-07-29 ENCOUNTER — Encounter: Payer: Self-pay | Admitting: Internal Medicine

## 2023-07-30 NOTE — Telephone Encounter (Signed)
I have printed the letter.  Please notify family.

## 2023-08-15 ENCOUNTER — Encounter: Payer: Self-pay | Admitting: Podiatry

## 2023-08-15 ENCOUNTER — Ambulatory Visit (INDEPENDENT_AMBULATORY_CARE_PROVIDER_SITE_OTHER): Payer: Medicare Other | Admitting: Podiatry

## 2023-08-15 DIAGNOSIS — B351 Tinea unguium: Secondary | ICD-10-CM

## 2023-08-15 DIAGNOSIS — M79674 Pain in right toe(s): Secondary | ICD-10-CM | POA: Diagnosis not present

## 2023-08-15 DIAGNOSIS — M79675 Pain in left toe(s): Secondary | ICD-10-CM | POA: Diagnosis not present

## 2023-08-15 DIAGNOSIS — E1142 Type 2 diabetes mellitus with diabetic polyneuropathy: Secondary | ICD-10-CM | POA: Diagnosis not present

## 2023-08-20 NOTE — Progress Notes (Signed)
Subjective:  Patient ID: [name], male    DOB: November 16, 1925,  MRN: [id number]  89 y.o. male presents with at risk foot care with history of diabetic neuropathy and painful elongated mycotic toenails 1-5 bilaterally which are tender when wearing enclosed shoe gear. Pain is relieved with periodic professional debridement. Chief Complaint  Patient presents with   Diabetes    BS-130 A1C-6.9 PCPV-06/2023    .    PCP: Dale Limestone, MD.  New problem(s): None.   Review of Systems: Negative except as noted in the HPI.   Allergies  Allergen Reactions   Magnesium Other (See Comments)    Myasthenia gravis   Penicillamine Other (See Comments)    Myasthenia gravis    Objective:  There were no vitals filed for this visit. Constitutional Patient is a pleasant 89 y.o. Caucasian male WD, WN in NAD. AAO x 3.  Vascular Capillary fill time to digits <3 seconds.  DP/PT pulse(s) are faintly palpable b/l lower extremities. Pedal hair absent b/l. Lower extremity skin temperature gradient warm to cool b/l. No pain with calf compression b/l. No cyanosis or clubbing noted. No ischemia nor gangrene noted b/l.   Neurologic Vibratory sensation intact b/l. No clonus b/l. Pt has subjective symptoms of neuropathy. Protective sensation diminished with 10g monofilament b/l.  Dermatologic Pedal skin is thin, shiny and atrophic b/l.  No open wounds b/l lower extremities. No interdigital macerations b/l lower extremities. Toenails 1-5 b/l elongated, discolored, dystrophic, thickened, crumbly with subungual debris and tenderness to dorsal palpation. No corns, calluses nor porokeratotic lesions noted.  Orthopedic: Normal muscle strength 5/5 to all lower extremity muscle groups bilaterally. Dropfoot left foot and right foot. Wearing AFO on right foot.   Last HgA1c:     Latest Ref Rng & Units 03/22/2023   11:12 AM 03/13/2023   10:55 AM 12/12/2022   12:24 PM 08/23/2022   12:06 PM  Hemoglobin A1C   Hemoglobin-A1c 4.6 - 6.5 % 6.5  6.5  6.6  7.0    Assessment:   1. Pain due to onychomycosis of toenails of both feet   2. Diabetic polyneuropathy associated with type 2 diabetes mellitus (HCC)    Plan:  Patient was evaluated and treated and all questions answered. Consent given for treatment as described below: -Consent given for treatment as described below: -Examined patient. -Continue supportive shoe gear daily. -Mycotic toenails 1-5 bilaterally were debrided in length and girth with sterile nail nippers and dremel without incident. -Patient/POA to call should there be question/concern in the interim.  Return in about 3 months (around 11/15/2023).  Freddie Breech, DPM

## 2023-08-22 ENCOUNTER — Encounter: Payer: Self-pay | Admitting: Urology

## 2023-08-22 ENCOUNTER — Ambulatory Visit: Payer: Medicare Other | Admitting: Urology

## 2023-08-22 VITALS — BP 117/66 | HR 77 | Ht 70.0 in | Wt 190.0 lb

## 2023-08-22 DIAGNOSIS — Z87440 Personal history of urinary (tract) infections: Secondary | ICD-10-CM | POA: Diagnosis not present

## 2023-08-22 DIAGNOSIS — N401 Enlarged prostate with lower urinary tract symptoms: Secondary | ICD-10-CM

## 2023-08-22 DIAGNOSIS — N390 Urinary tract infection, site not specified: Secondary | ICD-10-CM

## 2023-08-22 LAB — BLADDER SCAN AMB NON-IMAGING

## 2023-08-22 MED ORDER — TAMSULOSIN HCL 0.4 MG PO CAPS
ORAL_CAPSULE | ORAL | 3 refills | Status: DC
Start: 2023-08-22 — End: 2023-11-07

## 2023-08-22 MED ORDER — FINASTERIDE 5 MG PO TABS
ORAL_TABLET | ORAL | 3 refills | Status: DC
Start: 2023-08-22 — End: 2024-08-20

## 2023-08-22 NOTE — Patient Instructions (Signed)
I would recommend taking cranberry tablet supplement over-the-counter 1-2 times daily to help prevent infections

## 2023-08-22 NOTE — Progress Notes (Signed)
08/22/2023 11:24 AM   [name] January 23, 1926 [id number]  Reason for visit: Follow up BPH, history of UTI  HPI: I saw [name] in urology clinic for the above issues.  He is a 89 year old male with a long history of urinary symptoms well controlled on Flomax and finasteride.    He did have a UTI that was incompletely treated in January and required a longer course of antibiotics.  He denies any problems since that time.  A renal ultrasound was also ordered by Michiel Cowboy, and I personally reviewed and interpreted those images dated 01/04/2023 showing no hydronephrosis or abnormalities.  He denies any urinary complaints.  He continues on Flomax and finasteride.  He is not having any incontinence or significant nocturia.  We previously had worked on behavioral strategies prior to bedtime including the timing of Lasix dose and lower extremity compression socks for his nocturia related to lower extremity edema.  Bladder scan today but he denies the urge to void and has not voided for a few hours. He would like to continue Flomax and finasteride which is very reasonable.  I did recommend adding cranberry tablets to help prevent infections.  Flomax and finasteride refilled RTC 1 year PVR   Sondra Come, MD  [facility] [address] [place], [place] [address] [phone]

## 2023-08-27 ENCOUNTER — Encounter: Payer: Self-pay | Admitting: Internal Medicine

## 2023-08-27 NOTE — Telephone Encounter (Signed)
Ok to get flu vaccine now.  Regarding covid vaccine, the guidelines recommend covid vaccine for high risk pts.  Also, if has been one year since had shingles, ok to get shingles vaccine.  I would not recommend him getting all three at one time.

## 2023-08-29 ENCOUNTER — Other Ambulatory Visit: Payer: Medicare Other

## 2023-08-29 ENCOUNTER — Inpatient Hospital Stay: Payer: Medicare Other | Attending: Oncology

## 2023-08-29 DIAGNOSIS — Z85828 Personal history of other malignant neoplasm of skin: Secondary | ICD-10-CM | POA: Diagnosis not present

## 2023-08-29 DIAGNOSIS — Z79624 Long term (current) use of inhibitors of nucleotide synthesis: Secondary | ICD-10-CM | POA: Diagnosis not present

## 2023-08-29 DIAGNOSIS — Z8673 Personal history of transient ischemic attack (TIA), and cerebral infarction without residual deficits: Secondary | ICD-10-CM | POA: Insufficient documentation

## 2023-08-29 DIAGNOSIS — Z7984 Long term (current) use of oral hypoglycemic drugs: Secondary | ICD-10-CM | POA: Diagnosis not present

## 2023-08-29 DIAGNOSIS — D75839 Thrombocytosis, unspecified: Secondary | ICD-10-CM | POA: Diagnosis not present

## 2023-08-29 DIAGNOSIS — K219 Gastro-esophageal reflux disease without esophagitis: Secondary | ICD-10-CM | POA: Insufficient documentation

## 2023-08-29 DIAGNOSIS — Z7982 Long term (current) use of aspirin: Secondary | ICD-10-CM | POA: Diagnosis not present

## 2023-08-29 DIAGNOSIS — H01003 Unspecified blepharitis right eye, unspecified eyelid: Secondary | ICD-10-CM | POA: Diagnosis not present

## 2023-08-29 DIAGNOSIS — Z79899 Other long term (current) drug therapy: Secondary | ICD-10-CM | POA: Diagnosis not present

## 2023-08-29 DIAGNOSIS — E7800 Pure hypercholesterolemia, unspecified: Secondary | ICD-10-CM | POA: Diagnosis not present

## 2023-08-29 DIAGNOSIS — E119 Type 2 diabetes mellitus without complications: Secondary | ICD-10-CM | POA: Diagnosis not present

## 2023-08-29 DIAGNOSIS — L570 Actinic keratosis: Secondary | ICD-10-CM | POA: Diagnosis not present

## 2023-08-29 DIAGNOSIS — N400 Enlarged prostate without lower urinary tract symptoms: Secondary | ICD-10-CM | POA: Diagnosis not present

## 2023-08-29 DIAGNOSIS — Z23 Encounter for immunization: Secondary | ICD-10-CM | POA: Diagnosis not present

## 2023-08-29 DIAGNOSIS — I10 Essential (primary) hypertension: Secondary | ICD-10-CM | POA: Diagnosis not present

## 2023-08-29 DIAGNOSIS — D509 Iron deficiency anemia, unspecified: Secondary | ICD-10-CM | POA: Insufficient documentation

## 2023-08-29 LAB — CBC WITH DIFFERENTIAL/PLATELET
Abs Immature Granulocytes: 0.03 10*3/uL (ref 0.00–0.07)
Basophils Absolute: 0.1 10*3/uL (ref 0.0–0.1)
Basophils Relative: 1 %
Eosinophils Absolute: 0.1 10*3/uL (ref 0.0–0.5)
Eosinophils Relative: 1 %
HCT: 33.8 % — ABNORMAL LOW (ref 39.0–52.0)
Hemoglobin: 10.8 g/dL — ABNORMAL LOW (ref 13.0–17.0)
Immature Granulocytes: 0 %
Lymphocytes Relative: 17 %
Lymphs Abs: 1.2 10*3/uL (ref 0.7–4.0)
MCH: 31.4 pg (ref 26.0–34.0)
MCHC: 32 g/dL (ref 30.0–36.0)
MCV: 98.3 fL (ref 80.0–100.0)
Monocytes Absolute: 0.6 10*3/uL (ref 0.1–1.0)
Monocytes Relative: 9 %
Neutro Abs: 5.1 10*3/uL (ref 1.7–7.7)
Neutrophils Relative %: 72 %
Platelets: 405 10*3/uL — ABNORMAL HIGH (ref 150–400)
RBC: 3.44 MIL/uL — ABNORMAL LOW (ref 4.22–5.81)
RDW: 12.5 % (ref 11.5–15.5)
WBC: 7.1 10*3/uL (ref 4.0–10.5)
nRBC: 0 % (ref 0.0–0.2)

## 2023-08-29 LAB — COMPREHENSIVE METABOLIC PANEL
ALT: 10 U/L (ref 0–44)
AST: 17 U/L (ref 15–41)
Albumin: 3.8 g/dL (ref 3.5–5.0)
Alkaline Phosphatase: 39 U/L (ref 38–126)
Anion gap: 9 (ref 5–15)
BUN: 14 mg/dL (ref 8–23)
CO2: 27 mmol/L (ref 22–32)
Calcium: 9.1 mg/dL (ref 8.9–10.3)
Chloride: 100 mmol/L (ref 98–111)
Creatinine, Ser: 0.83 mg/dL (ref 0.61–1.24)
GFR, Estimated: >60 mL/min (ref >60)
Glucose, Bld: 134 mg/dL — ABNORMAL HIGH (ref 70–99)
Potassium: 4.5 mmol/L (ref 3.5–5.1)
Sodium: 136 mmol/L (ref 135–145)
Total Bilirubin: 0.4 mg/dL (ref 0.3–1.2)
Total Protein: 7.6 g/dL (ref 6.5–8.1)

## 2023-08-29 LAB — IRON AND TIBC
Iron: 75 ug/dL (ref 45–182)
Saturation Ratios: 33 % (ref 17.9–39.5)
TIBC: 230 ug/dL — ABNORMAL LOW (ref 250–450)
UIBC: 155 ug/dL

## 2023-08-29 LAB — FERRITIN: Ferritin: 429 ng/mL — ABNORMAL HIGH (ref 24–336)

## 2023-09-05 ENCOUNTER — Encounter: Payer: Self-pay | Admitting: Oncology

## 2023-09-05 ENCOUNTER — Inpatient Hospital Stay (HOSPITAL_BASED_OUTPATIENT_CLINIC_OR_DEPARTMENT_OTHER): Payer: Medicare Other | Admitting: Oncology

## 2023-09-05 ENCOUNTER — Inpatient Hospital Stay: Payer: Medicare Other

## 2023-09-05 VITALS — BP 123/55 | HR 69 | Temp 97.9°F | Resp 16 | Ht 70.0 in | Wt 194.0 lb

## 2023-09-05 DIAGNOSIS — D509 Iron deficiency anemia, unspecified: Secondary | ICD-10-CM

## 2023-09-05 DIAGNOSIS — E119 Type 2 diabetes mellitus without complications: Secondary | ICD-10-CM | POA: Diagnosis not present

## 2023-09-05 DIAGNOSIS — N400 Enlarged prostate without lower urinary tract symptoms: Secondary | ICD-10-CM | POA: Diagnosis not present

## 2023-09-05 DIAGNOSIS — K219 Gastro-esophageal reflux disease without esophagitis: Secondary | ICD-10-CM | POA: Diagnosis not present

## 2023-09-05 DIAGNOSIS — D75839 Thrombocytosis, unspecified: Secondary | ICD-10-CM | POA: Diagnosis not present

## 2023-09-05 DIAGNOSIS — L570 Actinic keratosis: Secondary | ICD-10-CM | POA: Diagnosis not present

## 2023-09-06 ENCOUNTER — Encounter: Payer: Self-pay | Admitting: Oncology

## 2023-09-10 ENCOUNTER — Other Ambulatory Visit: Payer: Self-pay | Admitting: Internal Medicine

## 2023-09-10 DIAGNOSIS — E1165 Type 2 diabetes mellitus with hyperglycemia: Secondary | ICD-10-CM

## 2023-09-25 DIAGNOSIS — E119 Type 2 diabetes mellitus without complications: Secondary | ICD-10-CM | POA: Diagnosis not present

## 2023-09-25 DIAGNOSIS — H02054 Trichiasis without entropian left upper eyelid: Secondary | ICD-10-CM | POA: Diagnosis not present

## 2023-09-25 DIAGNOSIS — H43813 Vitreous degeneration, bilateral: Secondary | ICD-10-CM | POA: Diagnosis not present

## 2023-09-25 LAB — HM DIABETES EYE EXAM

## 2023-10-24 ENCOUNTER — Ambulatory Visit (INDEPENDENT_AMBULATORY_CARE_PROVIDER_SITE_OTHER): Payer: Medicare Other | Admitting: Internal Medicine

## 2023-10-24 VITALS — BP 122/70 | HR 71 | Temp 98.0°F | Resp 16 | Ht 70.0 in | Wt 195.6 lb

## 2023-10-24 DIAGNOSIS — E1142 Type 2 diabetes mellitus with diabetic polyneuropathy: Secondary | ICD-10-CM | POA: Diagnosis not present

## 2023-10-24 DIAGNOSIS — E1165 Type 2 diabetes mellitus with hyperglycemia: Secondary | ICD-10-CM

## 2023-10-24 DIAGNOSIS — K5900 Constipation, unspecified: Secondary | ICD-10-CM | POA: Diagnosis not present

## 2023-10-24 DIAGNOSIS — D509 Iron deficiency anemia, unspecified: Secondary | ICD-10-CM

## 2023-10-24 DIAGNOSIS — E538 Deficiency of other specified B group vitamins: Secondary | ICD-10-CM | POA: Diagnosis not present

## 2023-10-24 DIAGNOSIS — E871 Hypo-osmolality and hyponatremia: Secondary | ICD-10-CM | POA: Diagnosis not present

## 2023-10-24 DIAGNOSIS — G7000 Myasthenia gravis without (acute) exacerbation: Secondary | ICD-10-CM

## 2023-10-24 DIAGNOSIS — I10 Essential (primary) hypertension: Secondary | ICD-10-CM | POA: Diagnosis not present

## 2023-10-24 DIAGNOSIS — E7800 Pure hypercholesterolemia, unspecified: Secondary | ICD-10-CM | POA: Diagnosis not present

## 2023-10-24 DIAGNOSIS — D649 Anemia, unspecified: Secondary | ICD-10-CM | POA: Diagnosis not present

## 2023-10-24 LAB — MICROALBUMIN / CREATININE URINE RATIO
Creatinine,U: 47 mg/dL
Microalb Creat Ratio: 2.5 mg/g (ref 0.0–30.0)
Microalb, Ur: 1.2 mg/dL (ref 0.0–1.9)

## 2023-10-24 LAB — HEPATIC FUNCTION PANEL
ALT: 8 U/L (ref 0–53)
AST: 14 U/L (ref 0–37)
Albumin: 3.9 g/dL (ref 3.5–5.2)
Alkaline Phosphatase: 50 U/L (ref 39–117)
Bilirubin, Direct: 0.2 mg/dL (ref 0.0–0.3)
Total Bilirubin: 0.6 mg/dL (ref 0.2–1.2)
Total Protein: 7.2 g/dL (ref 6.0–8.3)

## 2023-10-24 LAB — BASIC METABOLIC PANEL
BUN: 14 mg/dL (ref 6–23)
CO2: 26 meq/L (ref 19–32)
Calcium: 9.2 mg/dL (ref 8.4–10.5)
Chloride: 98 meq/L (ref 96–112)
Creatinine, Ser: 0.74 mg/dL (ref 0.40–1.50)
GFR: 77.26 mL/min (ref >60.00)
Glucose, Bld: 111 mg/dL — ABNORMAL HIGH (ref 70–99)
Potassium: 4.9 meq/L (ref 3.5–5.1)
Sodium: 135 meq/L (ref 135–145)

## 2023-10-24 LAB — LIPID PANEL
Cholesterol: 98 mg/dL (ref 0–200)
HDL: 42.4 mg/dL (ref >39.00)
LDL Cholesterol: 43 mg/dL (ref 0–99)
NonHDL: 55.85
Total CHOL/HDL Ratio: 2
Triglycerides: 66 mg/dL (ref 0.0–149.0)
VLDL: 13.2 mg/dL (ref 0.0–40.0)

## 2023-10-24 LAB — HM DIABETES FOOT EXAM

## 2023-10-24 LAB — TSH: TSH: 2.22 u[IU]/mL (ref 0.35–5.50)

## 2023-10-24 LAB — HEMOGLOBIN A1C: Hgb A1c MFr Bld: 6.5 % (ref 4.6–6.5)

## 2023-10-24 LAB — VITAMIN B12: Vitamin B-12: 420 pg/mL (ref 211–911)

## 2023-10-24 NOTE — Progress Notes (Signed)
Subjective:    Patient ID: [name], male    DOB: 1926/10/01, 89 y.o.   MRN: [id number]  Patient here for a scheduled follow up.   HPI Here for a scheduled follow up - follow up regarding hypertension, hyponatremia and diabetes.  EMG - [facility] - There is electrodiagnostic evidence of a right greater than left lumbosacral plexopahty. There also is electrodiagnostic evidence of a background sensorimotor polyneuropathy. Had f/u with urology 08/22/23 - continues on flomax and finasteride. Saw hematology 09/05/23 - hgb stable 10.8.  iron stores wnl. F/u 4 months. Reports he is doing relatively well. No chest pain.  Breathing stable. Some minimal allergy symptoms - runny nose - started this am. No chest congestion or cough. No abdominal pain.  Reports some issues with keeping his bowels moving.  Taking miralax, senna S and stool softener.    Past Medical History:  Diagnosis Date   Actinic keratosis 02/01/2021   midline anterior chin    Allergic rhinitis    Basal cell carcinoma 01/29/2007   Right mid forehead. Excised: 03/24/2007, margins free.   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 Excised 07/27/2014, residual focus AFX, margins free.   Squamous cell carcinoma of skin 04/01/2007   Right forehead, 2cm above mid brow. WD SCC wtih superficial infiltration. Excised: 05/14/2007, margins free   Squamous cell carcinoma of skin 05/16/2016   Crown. WD SCC, ulcerated. Excised: 07/03/2016, residual MD SCC, deep  margin involved. Excised: 07/17/2016, margins free.   TIA (transient ischemic attack) 09/13/2015   Urinary outflow obstruction    mild   Past Surgical History:  Procedure Laterality Date   CATARACT EXTRACTION Bilateral    PARTIAL HIP ARTHROPLASTY  01/2019   PILONIDAL CYST EXCISION  1951   removal   ROTATOR CUFF REPAIR  1995   SKIN CANCER EXCISION     multiple   SQUAMOUS CELL CARCINOMA EXCISION     behind head   TONSILLECTOMY AND ADENOIDECTOMY  1938   Family History  Problem Relation Age of Onset   Heart disease Father        heart atack   Alzheimer's disease Mother    CVA Brother    Social History   Socioeconomic History   Marital status: Widowed    Spouse name: Not on file   Number of children: Not on file   Years of education: Not on file   Highest education level: Bachelor's degree (e.g., BA, AB, BS)  Occupational History   Not on file  Tobacco Use   Smoking status: Never    Passive exposure: Never   Smokeless tobacco: Never  Vaping Use   Vaping status: Never Used  Substance and Sexual Activity   Alcohol use: No    Alcohol/week: 0.0 standard drinks of alcohol   Drug use: No   Sexual activity: Not Currently    Birth control/protection: None  Other Topics Concern   Not on file  Social History Narrative  Retired, married. Walks everyday         Social Drivers of Health   Financial Resource Strain: Low Risk  (10/24/2023)   Overall Financial Resource Strain (CARDIA)    Difficulty of Paying Living Expenses: Not hard at all  Food Insecurity: No Food Insecurity (10/24/2023)   Hunger Vital Sign    Worried About Running Out of Food in the Last Year: Never true    Ran Out of Food in the Last Year: Never true  Transportation Needs: No Transportation Needs (10/24/2023)   PRAPARE - Administrator, Civil Service (Medical): No    Lack of Transportation (Non-Medical): No  Physical Activity: Unknown (10/24/2023)   Exercise Vital Sign    Days of  Exercise per Week: 0 days    Minutes of Exercise per Session: Not on file  Stress: No Stress Concern Present (10/24/2023)   [employer] of Occupational Health - Occupational Stress Questionnaire    Feeling of Stress : Only a little  Social Connections: Moderately Integrated (10/24/2023)   Social Connection and Isolation Panel [NHANES]    Frequency of Communication with Friends and Family: More than three times a week    Frequency of Social Gatherings with Friends and Family: Three times a week    Attends Religious Services: More than 4 times per year    Active Member of Clubs or Organizations: Yes    Attends Banker Meetings: More than 4 times per year    Marital Status: Widowed     Review of Systems  Constitutional:  Negative for appetite change and unexpected weight change.  HENT:  Negative for sinus pressure.        Minimal runny nose as outlined.   Respiratory:  Negative for cough, chest tightness and shortness of breath.   Cardiovascular:  Negative for chest pain and palpitations.  Gastrointestinal:  Positive for constipation. Negative for abdominal pain, nausea and vomiting.  Genitourinary:  Negative for difficulty urinating and dysuria.  Musculoskeletal:  Negative for joint swelling and myalgias.  Skin:  Negative for color change and rash.  Neurological:  Negative for dizziness and headaches.  Psychiatric/Behavioral:  Negative for agitation and dysphoric mood.        Objective:     BP 122/70   Pulse 71   Temp 98 F (36.7 C)   Resp 16   Ht 5\' 10"  (1.778 m)   Wt 195 lb 9.6 oz (88.7 kg)   SpO2 99%   BMI 28.07 kg/m  Wt Readings from Last 3 Encounters:  10/24/23 195 lb 9.6 oz (88.7 kg)  09/05/23 194 lb (88 kg)  08/22/23 190 lb (86.2 kg)    Physical Exam Vitals reviewed.  Constitutional:      General: He is not in acute distress.    Appearance: Normal appearance. He is well-developed.  HENT:     Head: Normocephalic and atraumatic.     Right  Ear: External ear normal.     Left Ear: External ear normal.     Mouth/Throat:     Pharynx: No oropharyngeal exudate or posterior oropharyngeal erythema.  Eyes:     General: No scleral icterus.       Right eye: No discharge.        Left eye: No discharge.     Conjunctiva/sclera: Conjunctivae normal.  Cardiovascular:     Rate and Rhythm: Normal rate and regular rhythm.  Pulmonary:     Effort: Pulmonary effort is normal. No respiratory distress.  Breath sounds: Normal breath sounds.  Abdominal:     General: Bowel sounds are normal.     Palpations: Abdomen is soft.     Tenderness: There is no abdominal tenderness.  Musculoskeletal:        General: No swelling or tenderness.     Cervical back: Neck supple. No tenderness.  Lymphadenopathy:     Cervical: No cervical adenopathy.  Skin:    Findings: No erythema or rash.  Neurological:     Mental Status: He is alert.  Psychiatric:        Mood and Affect: Mood normal.        Behavior: Behavior normal.      Outpatient Encounter Medications as of 10/24/2023  Medication Sig   Accu-Chek FastClix Lancets MISC USE TO TEST BLOOD SUGAR ONCE DAILY   amLODipine (NORVASC) 10 MG tablet Take 1 tablet (10 mg total) by mouth daily.   aspirin 81 MG EC tablet Take 81 mg by mouth daily.   calcium-vitamin D (OSCAL WITH D) 500-200 MG-UNIT tablet Take 1 tablet by mouth 2 (two) times daily.   finasteride (PROSCAR) 5 MG tablet Take 1 tablet by mouth once daily   folic acid (FOLVITE) 1 MG tablet Take 1 tablet (1 mg total) by mouth daily.   furosemide (LASIX) 40 MG tablet Take 1 tablet by mouth once daily   gabapentin (NEURONTIN) 100 MG capsule Take 1 capsule (100 mg total) by mouth at bedtime.   glucose blood (ACCU-CHEK GUIDE) test strip USE 1 STRIP TO CHECK GLUCOSE ONCE DAILY. DX E11.9   lovastatin (MEVACOR) 20 MG tablet TAKE 1 TABLET BY MOUTH AT BEDTIME   metFORMIN (GLUCOPHAGE-XR) 500 MG 24 hr tablet Take 1 tablet by mouth once daily with breakfast    mycophenolate (CELLCEPT) 500 MG tablet Take 1,000 mg by mouth 2 (two) times daily.   olmesartan (BENICAR) 40 MG tablet Take 1 tablet by mouth once daily   ondansetron (ZOFRAN) 4 MG tablet Take 1 tablet (4 mg total) by mouth every 8 (eight) hours as needed for nausea or vomiting.   pantoprazole (PROTONIX) 20 MG tablet Take 1 tablet (20 mg total) by mouth daily.   potassium chloride SA (KLOR-CON M20) 20 MEQ tablet Take 1 tablet (20 mEq total) by mouth daily.   sodium chloride 1 g tablet Take 1 tablet (1 g total) by mouth daily.   tamsulosin (FLOMAX) 0.4 MG CAPS capsule TAKE 1 CAPSULE BY MOUTH ONCE DAILY AFTER  SUPPER   [DISCONTINUED] ferrous sulfate 325 (65 FE) MG EC tablet Take 325 mg by mouth 3 (three) times a week. Takes three times weekly. Monday Weds, Friday   No facility-administered encounter medications on file as of 10/24/2023.     Lab Results  Component Value Date   WBC 7.1 08/29/2023   HGB 10.8 (L) 08/29/2023   HCT 33.8 (L) 08/29/2023   PLT 405 (H) 08/29/2023   GLUCOSE 111 (H) 10/24/2023   CHOL 98 10/24/2023   TRIG 66.0 10/24/2023   HDL 42.40 10/24/2023   LDLCALC 43 10/24/2023   ALT 8 10/24/2023   AST 14 10/24/2023   NA 135 10/24/2023   K 4.9 10/24/2023   CL 98 10/24/2023   CREATININE 0.74 10/24/2023   BUN 14 10/24/2023   CO2 26 10/24/2023   TSH 2.22 10/24/2023   PSA 0.02 (L) 04/16/2018   HGBA1C 6.5 10/24/2023   MICROALBUR 1.2 10/24/2023    US RENAL Result Date: 01/04/2023 CLINICAL DATA:  Recurrent UTIs EXAM: RENAL /  URINARY TRACT ULTRASOUND COMPLETE COMPARISON:  None Available. FINDINGS: Right Kidney: Renal measurements: 10.6 x 5.8 x 5.6 cm = volume: 183 mL. Echogenicity within normal limits. No mass or hydronephrosis visualized. Left Kidney: Renal measurements: 10.8 x 4.4 x 5.8 cm = volume: 152 mL. Echogenicity within normal limits. No mass or hydronephrosis visualized. Bladder: Appears normal for degree of bladder distention. Other: None. IMPRESSION: Normal study.  Electronically Signed   By: Gerome Sam III M.D.   On: 01/04/2023 17:00       Assessment & Plan:  Type 2 diabetes mellitus with hyperglycemia, without long-term current use of insulin (HCC) Assessment & Plan: Low carb diet and exercise.  Follow met b and a1c.    Orders: -     Hemoglobin A1c -     Basic metabolic panel -     Microalbumin / creatinine urine ratio  HYPERTENSION, BENIGN Assessment & Plan: On amlodipine and benicar.  Follow pressures.  Follow metabolic panel.    Hypercholesterolemia Assessment & Plan: On lovastatin.  Low cholesterol diet and exercise.  Follow lipid panel and liver function tests.    Orders: -     TSH -     Lipid panel -     Hepatic function panel  B12 deficiency -     Vitamin B12  Myasthenia gravis (HCC) Assessment & Plan: Followed by neurology.  Stable.  Continue cellcept.  Follow cbc.    Iron deficiency anemia, unspecified iron deficiency anemia type Assessment & Plan: Seeing Dr Orlie Dakin for IDA.  Receiving IV venofer. Follow cbc and iron studies.    Hyponatremia Assessment & Plan: On lasix 40mg  q day now. Taking sodium tablets.  Follow sodium.    Diabetic polyneuropathy associated with type 2 diabetes mellitus (HCC) Assessment & Plan: Appears to be stable currently.  On gabapentin.    Anemia, unspecified type Assessment & Plan:  F/u hematology 09/2023 - hgb 10.8. hold further IV iron.    Constipation, unspecified constipation type Assessment & Plan: Taking miralax - can increase dose.  Continues stool softener and senna. Follow.  Call with update.       [place], MD

## 2023-10-27 ENCOUNTER — Encounter: Payer: Self-pay | Admitting: Internal Medicine

## 2023-10-27 DIAGNOSIS — K5900 Constipation, unspecified: Secondary | ICD-10-CM | POA: Insufficient documentation

## 2023-10-27 NOTE — Assessment & Plan Note (Signed)
Appears to be stable currently.  On gabapentin.

## 2023-10-27 NOTE — Assessment & Plan Note (Addendum)
Seeing Dr Orlie Dakin for IDA.  Receiving IV venofer. Follow cbc and iron studies.

## 2023-10-27 NOTE — Assessment & Plan Note (Signed)
Low carb diet and exercise.  Follow met b and a1c.   

## 2023-10-27 NOTE — Assessment & Plan Note (Signed)
Followed by neurology.  Stable.  Continue cellcept.  Follow cbc.  

## 2023-10-27 NOTE — Assessment & Plan Note (Signed)
Taking miralax - can increase dose.  Continues stool softener and senna. Follow.  Call with update.

## 2023-10-27 NOTE — Assessment & Plan Note (Signed)
On amlodipine and benicar.  Follow pressures.  Follow metabolic panel.

## 2023-10-27 NOTE — Assessment & Plan Note (Signed)
On lasix 40mg  q day now. Taking sodium tablets.  Follow sodium.

## 2023-10-27 NOTE — Assessment & Plan Note (Signed)
On lovastatin.  Low cholesterol diet and exercise.  Follow lipid panel and liver function tests.   

## 2023-10-27 NOTE — Assessment & Plan Note (Signed)
F/u hematology 09/2023 - hgb 10.8. hold further IV iron.

## 2023-11-07 ENCOUNTER — Telehealth: Payer: Self-pay | Admitting: Urology

## 2023-11-07 ENCOUNTER — Encounter: Payer: Self-pay | Admitting: Internal Medicine

## 2023-11-07 DIAGNOSIS — N401 Enlarged prostate with lower urinary tract symptoms: Secondary | ICD-10-CM

## 2023-11-07 MED ORDER — TAMSULOSIN HCL 0.4 MG PO CAPS: ORAL_CAPSULE | ORAL | 3 refills | Status: DC

## 2023-11-07 NOTE — Telephone Encounter (Signed)
 RX sent

## 2023-11-07 NOTE — Telephone Encounter (Signed)
 Pt needs RX for Tamsulosin that is at [facility] sent to [facility].  He only has a few left and changed pharmacies.

## 2023-11-08 ENCOUNTER — Other Ambulatory Visit: Payer: Self-pay

## 2023-11-08 ENCOUNTER — Other Ambulatory Visit: Payer: Self-pay | Admitting: Internal Medicine

## 2023-11-08 DIAGNOSIS — E119 Type 2 diabetes mellitus without complications: Secondary | ICD-10-CM

## 2023-11-08 DIAGNOSIS — I10 Essential (primary) hypertension: Secondary | ICD-10-CM

## 2023-11-08 DIAGNOSIS — R110 Nausea: Secondary | ICD-10-CM

## 2023-11-08 MED ORDER — SODIUM CHLORIDE 1 G PO TABS: 1.0000 g | ORAL_TABLET | Freq: Every day | ORAL | 1 refills | Status: DC

## 2023-11-08 MED ORDER — PANTOPRAZOLE SODIUM 20 MG PO TBEC: 20.0000 mg | DELAYED_RELEASE_TABLET | Freq: Every day | ORAL | 3 refills | Status: DC

## 2023-11-08 MED ORDER — FOLIC ACID 1 MG PO TABS: 1.0000 mg | ORAL_TABLET | Freq: Every day | ORAL | 1 refills | Status: DC

## 2023-11-08 MED ORDER — OLMESARTAN MEDOXOMIL 40 MG PO TABS: ORAL_TABLET | ORAL | 1 refills | Status: DC

## 2023-11-08 MED ORDER — AMLODIPINE BESYLATE 10 MG PO TABS: 10.0000 mg | ORAL_TABLET | Freq: Every day | ORAL | 1 refills | Status: DC

## 2023-11-08 MED ORDER — FUROSEMIDE 40 MG PO TABS: 40.0000 mg | ORAL_TABLET | Freq: Every day | ORAL | 1 refills | Status: DC

## 2023-11-08 MED ORDER — GABAPENTIN 100 MG PO CAPS: 100.0000 mg | ORAL_CAPSULE | Freq: Every day | ORAL | 1 refills | Status: DC

## 2023-11-08 MED ORDER — POTASSIUM CHLORIDE CRYS ER 20 MEQ PO TBCR: 20.0000 meq | EXTENDED_RELEASE_TABLET | Freq: Every day | ORAL | 1 refills | Status: DC

## 2023-11-08 MED ORDER — METFORMIN HCL ER 500 MG PO TB24: 500.0000 mg | ORAL_TABLET | Freq: Every day | ORAL | 0 refills | Status: DC

## 2023-11-08 MED ORDER — LOVASTATIN 20 MG PO TABS: 20.0000 mg | ORAL_TABLET | Freq: Every day | ORAL | 3 refills | Status: DC

## 2023-11-14 ENCOUNTER — Ambulatory Visit: Payer: Medicare Other | Admitting: Dermatology

## 2023-11-28 ENCOUNTER — Ambulatory Visit: Payer: Medicare Other | Admitting: Podiatry

## 2023-11-29 ENCOUNTER — Other Ambulatory Visit: Payer: Self-pay | Admitting: Internal Medicine

## 2023-11-29 DIAGNOSIS — I10 Essential (primary) hypertension: Secondary | ICD-10-CM

## 2023-12-05 ENCOUNTER — Ambulatory Visit: Payer: Medicare Other | Admitting: Dermatology

## 2023-12-05 ENCOUNTER — Encounter: Payer: Self-pay | Admitting: Dermatology

## 2023-12-05 DIAGNOSIS — L570 Actinic keratosis: Secondary | ICD-10-CM

## 2023-12-05 DIAGNOSIS — W908XXA Exposure to other nonionizing radiation, initial encounter: Secondary | ICD-10-CM | POA: Diagnosis not present

## 2023-12-05 DIAGNOSIS — L720 Epidermal cyst: Secondary | ICD-10-CM | POA: Diagnosis not present

## 2023-12-05 DIAGNOSIS — L578 Other skin changes due to chronic exposure to nonionizing radiation: Secondary | ICD-10-CM | POA: Diagnosis not present

## 2023-12-05 DIAGNOSIS — D692 Other nonthrombocytopenic purpura: Secondary | ICD-10-CM

## 2023-12-05 DIAGNOSIS — L814 Other melanin hyperpigmentation: Secondary | ICD-10-CM | POA: Diagnosis not present

## 2023-12-05 DIAGNOSIS — L729 Follicular cyst of the skin and subcutaneous tissue, unspecified: Secondary | ICD-10-CM

## 2023-12-05 NOTE — Progress Notes (Signed)
Follow-Up Visit   Subjective  [name] is a 89 y.o. male who presents for the following: AKs and ISKs. 10 month follow up. Face. Tx with LN2 at last visit.   The patient has spots, moles and lesions to be evaluated, some may be new or changing and the patient may have concern these could be cancer.   The following portions of the chart were reviewed this encounter and updated as appropriate: medications, allergies, medical history  Review of Systems:  No other skin or systemic complaints except as noted in HPI or Assessment and Plan.  Objective  Well appearing patient in no apparent distress; mood and affect are within normal limits.  A focused examination was performed of the following areas: Face, scalp, ears  Relevant physical exam findings are noted in the Assessment and Plan.  L post crown scalp x1, R post crown scalp x1, vertex scalp x1, L temporal hair line x1, upper forehead x2, L zygoma x1, R temporal hair line (vs ISK) x1, R sideburn x1, R lower cheek x1 (10) Erythematous thin papules/macules with gritty scale.   Assessment & Plan   ACTINIC DAMAGE - chronic, secondary to cumulative UV radiation exposure/sun exposure over time - diffuse scaly erythematous macules with underlying dyspigmentation - Recommend daily broad spectrum sunscreen SPF 30+ to sun-exposed areas, reapply every 2 hours as needed.  - Recommend staying in the shade or wearing long sleeves, sun glasses (UVA+UVB protection) and wide brim hats (4-inch brim around the entire circumference of the hat). - Call for new or changing lesions.  Purpura - Chronic; persistent and recurrent.  Treatable, but not curable. - Violaceous macules and patches at arms - Benign - Related to trauma, age, sun damage and/or use of blood thinners, chronic use of topical and/or oral steroids - Observe - Can use OTC arnica containing moisturizer such as Dermend Bruise Formula if desired - Call for worsening or other  concerns  LENTIGINES Exam: scattered tan macules at hands and arms Due to sun exposure Treatment Plan: Benign-appearing, observe. Recommend daily broad spectrum sunscreen SPF 30+ to sun-exposed areas, reapply every 2 hours as needed.  Call for any changes  EPIDERMAL INCLUSION CYST Exam: Firm subcutaneous nodules at L posterior neck, R posterior lower neck  Benign-appearing. Exam most consistent with an epidermal inclusion cyst. Discussed that a cyst is a benign growth that can grow over time and sometimes get irritated or inflamed. Recommend observation if it is not bothersome. Discussed option of surgical excision to remove it if it is growing, symptomatic, or other changes noted. Please call for new or changing lesions so they can be evaluated.   AK (ACTINIC KERATOSIS) (10) L post crown scalp x1, R post crown scalp x1, vertex scalp x1, L temporal hair line x1, upper forehead x2, L zygoma x1, R temporal hair line (vs ISK) x1, R sideburn x1, R lower cheek x1 (10) VS ISK at right temporal hairline  Actinic keratoses are precancerous spots that appear secondary to cumulative UV radiation exposure/sun exposure over time. They are chronic with expected duration over 1 year. A portion of actinic keratoses will progress to squamous cell carcinoma of the skin. It is not possible to reliably predict which spots will progress to skin cancer and so treatment is recommended to prevent development of skin cancer.  Recommend daily broad spectrum sunscreen SPF 30+ to sun-exposed areas, reapply every 2 hours as needed.  Recommend staying in the shade or wearing long sleeves, sun glasses (UVA+UVB  protection) and wide brim hats (4-inch brim around the entire circumference of the hat). Call for new or changing lesions. Destruction of lesion - L post crown scalp x1, R post crown scalp x1, vertex scalp x1, L temporal hair line x1, upper forehead x2, L zygoma x1, R temporal hair line (vs ISK) x1, R sideburn x1, R  lower cheek x1 (10)  Destruction method: cryotherapy   Informed consent: discussed and consent obtained   Lesion destroyed using liquid nitrogen: Yes   Region frozen until ice ball extended beyond lesion: Yes   Outcome: patient tolerated procedure well with no complications   Post-procedure details: wound care instructions given   Additional details:  Prior to procedure, discussed risks of blister formation, small wound, skin dyspigmentation, or rare scar following cryotherapy. Recommend Vaseline ointment to treated areas while healing.    Return in about 6 months (around 06/03/2024) for UBSE, AK Follow Up, With Dr. Gwen Pounds.  I, Lawson Radar, CMA, am acting as scribe for Willeen Niece, MD.   Documentation: I have reviewed the above documentation for accuracy and completeness, and I agree with the above.  Willeen Niece, MD

## 2023-12-05 NOTE — Patient Instructions (Addendum)
Cryotherapy Aftercare  Wash gently with soap and water everyday.   Apply Vaseline Jelly daily until healed.     Recommend daily broad spectrum sunscreen SPF 30+ to sun-exposed areas, reapply every 2 hours as needed. Call for new or changing lesions.  Staying in the shade or wearing long sleeves, sun glasses (UVA+UVB protection) and wide brim hats (4-inch brim around the entire circumference of the hat) are also recommended for sun protection.      Due to recent changes in healthcare laws, you may see results of your pathology and/or laboratory studies on MyChart before the doctors have had a chance to review them. We understand that in some cases there may be results that are confusing or concerning to you. Please understand that not all results are received at the same time and often the doctors may need to interpret multiple results in order to provide you with the best plan of care or course of treatment. Therefore, we ask that you please give [place] 2 business days to thoroughly review all your results before contacting the office for clarification. Should we see a critical lab result, you will be contacted sooner.   If You Need Anything After Your Visit  If you have any questions or concerns for your doctor, please call our main line at [phone] and press option 4 to reach your doctor's medical assistant. If no one answers, please leave a voicemail as directed and we will return your call as soon as possible. Messages left after 4 pm will be answered the following business day.   You may also send [place] a message via MyChart. We typically respond to MyChart messages within 1-2 business days.  For prescription refills, please ask your pharmacy to contact our office. Our fax number is [phone].  If you have an urgent issue when the clinic is closed that cannot wait until the next business day, you can page your doctor at the number below.    Please note that while we do our best to be  available for urgent issues outside of office hours, we are not available 24/7.   If you have an urgent issue and are unable to reach [place], you may choose to seek medical care at your doctor's office, retail clinic, urgent care center, or emergency room.  If you have a medical emergency, please immediately call 911 or go to the emergency department.  Pager Numbers  - Dr. Gwen Pounds: [phone]  - Dr. Roseanne Reno: [phone]  - Dr. Katrinka Blazing: [phone]   In the event of inclement weather, please call our main line at [phone] for an update on the status of any delays or closures.  Dermatology Medication Tips: Please keep the boxes that topical medications come in in order to help keep track of the instructions about where and how to use these. Pharmacies typically print the medication instructions only on the boxes and not directly on the medication tubes.   If your medication is too expensive, please contact our office at [phone] option 4 or send [place] a message through MyChart.   We are unable to tell what your co-pay for medications will be in advance as this is different depending on your insurance coverage. However, we may be able to find a substitute medication at lower cost or fill out paperwork to get insurance to cover a needed medication.   If a prior authorization is required to get your medication covered by your insurance company, please allow [place] 1-2 business days to complete  this process.  Drug prices often vary depending on where the prescription is filled and some pharmacies may offer cheaper prices.  The website www.goodrx.com contains coupons for medications through different pharmacies. The prices here do not account for what the cost may be with help from insurance (it may be cheaper with your insurance), but the website can give you the price if you did not use any insurance.  - You can print the associated coupon and take it with your prescription to the pharmacy.   - You may also stop by our office during regular business hours and pick up a GoodRx coupon card.  - If you need your prescription sent electronically to a different pharmacy, notify our office through [facility] or by phone at [phone] option 4.     Si Usted Necesita Algo Despus de Su Visita  Tambin puede enviarnos un mensaje a travs de Clinical cytogeneticist. Por lo general respondemos a los mensajes de MyChart en el transcurso de 1 a 2 das hbiles.  Para renovar recetas, por favor pida a su farmacia que se ponga en contacto con nuestra oficina. Annie Sable de fax es [place] [phone].  Si tiene un asunto urgente cuando la clnica est cerrada y que no puede esperar hasta el siguiente da hbil, puede llamar/localizar a su doctor(a) al nmero que aparece a continuacin.   Por favor, tenga en cuenta que aunque hacemos todo lo posible para estar disponibles para asuntos urgentes fuera del horario de [place], no estamos disponibles las 24 horas del da, los 7 [address]  [address] de la [place].   Si tiene un problema urgente y no puede comunicarse con nosotros, puede optar por buscar atencin mdica  en el consultorio de su doctor(a), en una clnica privada, en un centro de atencin urgente o en una sala de emergencias.  Si tiene Engineer, drilling, por favor llame inmediatamente al 911 o vaya a la sala de emergencias.  Nmeros de bper  - Dr. Gwen Pounds: [phone]  - Dra. Roseanne Reno: [phone]  - Dr. Katrinka Blazing: [phone]   En caso de inclemencias del tiempo, por favor llame a Lacy Duverney principal al [phone] para una actualizacin sobre el [place] de cualquier retraso o cierre.  Consejos para la medicacin en dermatologa: Por favor, guarde las cajas en las que vienen los medicamentos de uso tpico para ayudarle a seguir las instrucciones sobre dnde y cmo usarlos. Las farmacias generalmente imprimen las instrucciones del medicamento slo en las cajas y no directamente en los tubos del  [place].   Si su medicamento es muy caro, por favor, pngase en contacto con Rolm Gala llamando al [phone] y presione la opcin 4 o envenos un mensaje a travs de Clinical cytogeneticist.   No podemos decirle cul ser su copago por los medicamentos por adelantado ya que esto es diferente dependiendo de la cobertura de su seguro. Sin embargo, es posible que podamos encontrar un medicamento sustituto a Audiological scientist un formulario para que el seguro cubra el medicamento que se considera necesario.   Si se requiere una autorizacin previa para que su compaa de seguros [place] su medicamento, por favor permtanos de 1 a 2 das hbiles para completar [address]  Los precios de los medicamentos varan con frecuencia dependiendo del Environmental consultant de dnde se surte la receta y alguna farmacias pueden ofrecer precios ms baratos.  El sitio web www.goodrx.com tiene cupones para medicamentos de Health and safety inspector. Los precios aqu no tienen en cuenta lo que podra costar con la ayuda del  seguro (puede ser ms barato con su seguro), pero el sitio web puede darle el precio si no Visual merchandiser.  - Puede imprimir el cupn correspondiente y llevarlo con su receta a la farmacia.  - Tambin puede pasar por nuestra oficina durante el horario de atencin regular y Education officer, museum una tarjeta de cupones de GoodRx.  - Si necesita que su receta se enve electrnicamente a una farmacia diferente, informe a nuestra oficina a travs de MyChart de [place] o por telfono llamando al [phone] y presione la opcin 4.

## 2023-12-13 DIAGNOSIS — Z23 Encounter for immunization: Secondary | ICD-10-CM | POA: Diagnosis not present

## 2023-12-13 DIAGNOSIS — G7001 Myasthenia gravis with (acute) exacerbation: Secondary | ICD-10-CM | POA: Diagnosis not present

## 2023-12-13 DIAGNOSIS — H02054 Trichiasis without entropian left upper eyelid: Secondary | ICD-10-CM | POA: Diagnosis not present

## 2023-12-19 ENCOUNTER — Encounter: Payer: Self-pay | Admitting: Internal Medicine

## 2023-12-19 DIAGNOSIS — E1165 Type 2 diabetes mellitus with hyperglycemia: Secondary | ICD-10-CM

## 2023-12-20 MED ORDER — ACCU-CHEK GUIDE TEST VI STRP: ORAL_STRIP | 12 refills | Status: AC

## 2023-12-20 MED ORDER — ACCU-CHEK FASTCLIX LANCETS MISC: 12 refills | Status: AC

## 2024-01-02 ENCOUNTER — Other Ambulatory Visit: Payer: Medicare Other

## 2024-01-03 ENCOUNTER — Encounter: Payer: Self-pay | Admitting: Podiatry

## 2024-01-03 ENCOUNTER — Inpatient Hospital Stay: Payer: Medicare Other | Attending: Oncology

## 2024-01-03 ENCOUNTER — Ambulatory Visit (INDEPENDENT_AMBULATORY_CARE_PROVIDER_SITE_OTHER): Payer: Medicare Other | Admitting: Podiatry

## 2024-01-03 DIAGNOSIS — D509 Iron deficiency anemia, unspecified: Secondary | ICD-10-CM | POA: Insufficient documentation

## 2024-01-03 DIAGNOSIS — M79674 Pain in right toe(s): Secondary | ICD-10-CM

## 2024-01-03 DIAGNOSIS — E1142 Type 2 diabetes mellitus with diabetic polyneuropathy: Secondary | ICD-10-CM | POA: Diagnosis not present

## 2024-01-03 DIAGNOSIS — M79675 Pain in left toe(s): Secondary | ICD-10-CM

## 2024-01-03 DIAGNOSIS — B351 Tinea unguium: Secondary | ICD-10-CM | POA: Diagnosis not present

## 2024-01-03 LAB — IRON AND TIBC
Iron: 59 ug/dL (ref 45–182)
Saturation Ratios: 25 % (ref 17.9–39.5)
TIBC: 232 ug/dL — ABNORMAL LOW (ref 250–450)
UIBC: 173 ug/dL

## 2024-01-03 LAB — CMP (CANCER CENTER ONLY)
ALT: 11 U/L (ref 0–44)
AST: 17 U/L (ref 15–41)
Albumin: 3.6 g/dL (ref 3.5–5.0)
Alkaline Phosphatase: 43 U/L (ref 38–126)
Anion gap: 13 (ref 5–15)
BUN: 16 mg/dL (ref 8–23)
CO2: 23 mmol/L (ref 22–32)
Calcium: 8.7 mg/dL — ABNORMAL LOW (ref 8.9–10.3)
Chloride: 98 mmol/L (ref 98–111)
Creatinine: 0.75 mg/dL (ref 0.61–1.24)
GFR, Estimated: >60 mL/min (ref >60)
Glucose, Bld: 151 mg/dL — ABNORMAL HIGH (ref 70–99)
Potassium: 4.2 mmol/L (ref 3.5–5.1)
Sodium: 134 mmol/L — ABNORMAL LOW (ref 135–145)
Total Bilirubin: 0.7 mg/dL (ref 0.0–1.2)
Total Protein: 7.1 g/dL (ref 6.5–8.1)

## 2024-01-03 LAB — CBC WITH DIFFERENTIAL (CANCER CENTER ONLY)
Abs Immature Granulocytes: 0.03 10*3/uL (ref 0.00–0.07)
Basophils Absolute: 0.1 10*3/uL (ref 0.0–0.1)
Basophils Relative: 1 %
Eosinophils Absolute: 0.1 10*3/uL (ref 0.0–0.5)
Eosinophils Relative: 2 %
HCT: 32 % — ABNORMAL LOW (ref 39.0–52.0)
Hemoglobin: 10.4 g/dL — ABNORMAL LOW (ref 13.0–17.0)
Immature Granulocytes: 0 %
Lymphocytes Relative: 17 %
Lymphs Abs: 1.3 10*3/uL (ref 0.7–4.0)
MCH: 31.8 pg (ref 26.0–34.0)
MCHC: 32.5 g/dL (ref 30.0–36.0)
MCV: 97.9 fL (ref 80.0–100.0)
Monocytes Absolute: 0.7 10*3/uL (ref 0.1–1.0)
Monocytes Relative: 9 %
Neutro Abs: 5.5 10*3/uL (ref 1.7–7.7)
Neutrophils Relative %: 71 %
Platelet Count: 395 10*3/uL (ref 150–400)
RBC: 3.27 MIL/uL — ABNORMAL LOW (ref 4.22–5.81)
RDW: 12.8 % (ref 11.5–15.5)
WBC Count: 7.7 10*3/uL (ref 4.0–10.5)
nRBC: 0 % (ref 0.0–0.2)

## 2024-01-03 LAB — FERRITIN: Ferritin: 409 ng/mL — ABNORMAL HIGH (ref 24–336)

## 2024-01-03 NOTE — Progress Notes (Signed)
  Subjective:  Patient ID: [name], male    DOB: October 07, 1926,  MRN: [id number]  [name] presents to clinic today for at risk foot care with history of diabetic neuropathy and painful, elongated thickened toenails x 10 which are symptomatic when wearing enclosed shoe gear. This interferes with his/her daily activities.  Chief Complaint  Patient presents with   Diabetes    "Cut my toenails." Saw Dr. Dale Collingsworth - 10/24/2023,  A1c - 6.5   New problem(s): None.   PCP is Dale Girard, MD.  Allergies  Allergen Reactions   Magnesium Other (See Comments)    Myasthenia gravis   Penicillamine Other (See Comments)    Myasthenia gravis    Review of Systems: Negative except as noted in the HPI.  Objective: No changes noted in today's physical examination. There were no vitals filed for this visit. [name] is a pleasant 89 y.o. male WD, WN in NAD. AAO x 3.  Vascular Examination: Capillary fill time to digits <3 seconds.  DP/PT pulse(s) are faintly palpable b/l lower extremities. Pedal hair absent b/l. Lower extremity skin temperature gradient warm to cool b/l. No pain with calf compression b/l. No cyanosis or clubbing noted. No ischemia nor gangrene noted b/l.   Neurological Examination: Pt has subjective symptoms of neuropathy. Protective sensation diminished with 10g monofilament b/l.  Dermatological Examination: Pedal skin warm and supple b/l.   No open wounds. No interdigital macerations.  Toenails 1-5 b/l thick, discolored, elongated with subungual debris and pain on dorsal palpation.    Pedal skin thin, shiny and atrophic b/l LE.  No open wounds b/l. No interdigital macerations b/l. Toenails 1-5 b/l elongated, thickened, discolored with subungual debris. +Tenderness with dorsal palpation of nailplates. No hyperkeratotic or porokeratotic lesions present.  Musculoskeletal Examination: Dropfoot both feet. Wearing AFO on right  foot.  Radiographs: None  Last A1c:      Latest Ref Rng & Units 10/24/2023    1:46 PM 03/22/2023   11:12 AM 03/13/2023   10:55 AM  Hemoglobin A1C  Hemoglobin-A1c 4.6 - 6.5 % 6.5  6.5  6.5    Assessment/Plan: 1. Pain due to onychomycosis of toenails of both feet   2. Diabetic polyneuropathy associated with type 2 diabetes mellitus (HCC)     Patient was evaluated and treated. All patient's and/or POA's questions/concerns addressed on today's visit. Toenails 1-5 debrided in length and girth without incident. Continue foot and shoe inspections daily. Monitor blood glucose per PCP/Endocrinologist's recommendations. Continue soft, supportive shoe gear daily. Report any pedal injuries to medical professional. Call office if there are any questions/concerns. -Patient/POA to call should there be question/concern in the interim.   Return in about 3 months (around 04/01/2024).  Freddie Breech, DPM      [place] LOCATION: [address] [place] [address]                   Office [phone]   [facility] LOCATION: [address] [place], [place] [address] Office [phone]

## 2024-01-09 ENCOUNTER — Encounter: Payer: Self-pay | Admitting: Oncology

## 2024-01-09 ENCOUNTER — Inpatient Hospital Stay: Payer: Medicare Other

## 2024-01-09 ENCOUNTER — Inpatient Hospital Stay: Payer: Medicare Other | Attending: Oncology | Admitting: Oncology

## 2024-01-09 VITALS — BP 116/52 | HR 70 | Temp 99.2°F | Resp 16 | Ht 70.0 in | Wt 197.0 lb

## 2024-01-09 DIAGNOSIS — D509 Iron deficiency anemia, unspecified: Secondary | ICD-10-CM | POA: Insufficient documentation

## 2024-01-09 NOTE — Progress Notes (Signed)
 [facility]  Telephone:([phone] Fax:(336) [phone]  ID: [name] OB: 05-04-1926  MR#: [id number]  AOZ#:[id number]  Patient Care Team: Dale Augusta, MD as PCP - General (Unknown Physician Specialty) Antonieta Iba, MD as Consulting Physician (Cardiology) Jeralyn Ruths, MD as Consulting Physician (Oncology)  CHIEF COMPLAINT: Iron deficiency anemia  INTERVAL HISTORY: Patient returns to clinic today for repeat laboratory work, further evaluation, and consideration of additional IV Venofer.  He continues to feel well and remains asymptomatic.  He does not complain of any weakness or fatigue today.  He has no neurologic complaints.  He denies any recent fevers or illnesses.  He has a good appetite and denies weight loss.  He has no chest pain, shortness of breath, cough, or hemoptysis.  He denies any nausea, vomiting, constipation, or diarrhea.  He has no melena or hematochezia.  He has no urinary complaints.  Patient offers no specific complaints today.  REVIEW OF SYSTEMS:   Review of Systems  Constitutional: Negative.  Negative for fever, malaise/fatigue and weight loss.  Respiratory: Negative.  Negative for cough, hemoptysis and shortness of breath.   Cardiovascular: Negative.  Negative for chest pain and leg swelling.  Gastrointestinal:  Negative for abdominal pain, blood in stool and melena.  Genitourinary: Negative.  Negative for hematuria.  Musculoskeletal: Negative.  Negative for back pain.  Skin: Negative.  Negative for rash.  Neurological: Negative.  Negative for dizziness, focal weakness, weakness and headaches.  Psychiatric/Behavioral: Negative.  The patient is not nervous/anxious.     As per HPI. Otherwise, a complete review of systems is negative.  PAST MEDICAL HISTORY: Past Medical History:  Diagnosis Date   Actinic keratosis 02/01/2021   midline anterior chin    Allergic rhinitis    Basal cell carcinoma 01/29/2007   Right  mid forehead. Excised: 03/24/2007, margins free.   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 Excised 07/27/2014, residual focus AFX, margins free.   Squamous cell carcinoma of skin 04/01/2007   Right forehead, 2cm above mid brow. WD SCC wtih superficial infiltration. Excised: 05/14/2007, margins free   Squamous cell carcinoma of skin 05/16/2016   Crown. WD SCC, ulcerated. Excised: 07/03/2016, residual MD SCC, deep margin involved. Excised: 07/17/2016, margins free.   TIA (transient ischemic attack) 09/13/2015   Urinary outflow obstruction    mild    PAST SURGICAL HISTORY: Past Surgical History:  Procedure Laterality Date   CATARACT EXTRACTION Bilateral    PARTIAL HIP ARTHROPLASTY  01/2019   PILONIDAL CYST EXCISION  1951   removal   ROTATOR CUFF REPAIR  1995   SKIN CANCER EXCISION     multiple   SQUAMOUS CELL CARCINOMA EXCISION     behind head   TONSILLECTOMY AND ADENOIDECTOMY  1938    FAMILY HISTORY: Family History  Problem Relation Age of Onset   Heart disease Father        heart atack   Alzheimer's disease Mother  CVA Brother     ADVANCED DIRECTIVES (Y/N):  N  HEALTH MAINTENANCE: Social History   Tobacco Use   Smoking status: Never    Passive exposure: Never   Smokeless tobacco: Never  Vaping Use   Vaping status: Never Used  Substance Use Topics   Alcohol use: No    Alcohol/week: 0.0 standard drinks of alcohol   Drug use: No     Colonoscopy:  PAP:  Bone density:  Lipid  panel:  Allergies  Allergen Reactions   Magnesium Other (See Comments)    Myasthenia gravis   Penicillamine Other (See Comments)    Myasthenia gravis    Current Outpatient Medications  Medication Sig Dispense Refill   Accu-Chek FastClix Lancets MISC USE TO TEST BLOOD SUGAR ONCE DAILY 102 each 12   amLODipine (NORVASC) 10 MG tablet Take 1 tablet (10 mg total) by mouth daily. 90 tablet 1   aspirin 81 MG EC tablet Take 81 mg by mouth daily.     calcium-vitamin D (OSCAL WITH D) 500-200 MG-UNIT tablet Take 1 tablet by mouth 2 (two) times daily.     finasteride (PROSCAR) 5 MG tablet Take 1 tablet by mouth once daily 90 tablet 3   folic acid (FOLVITE) 1 MG tablet Take 1 tablet (1 mg total) by mouth daily. 90 tablet 1   furosemide (LASIX) 40 MG tablet Take 1 tablet (40 mg total) by mouth daily. 90 tablet 1   gabapentin (NEURONTIN) 100 MG capsule Take 1 capsule (100 mg total) by mouth at bedtime. 90 capsule 1   glucose blood (ACCU-CHEK GUIDE TEST) test strip Use to check blood sugars once daily. Dx E11.9 100 each 12   lovastatin (MEVACOR) 20 MG tablet Take 1 tablet (20 mg total) by mouth at bedtime. 90 tablet 3   metFORMIN (GLUCOPHAGE-XR) 500 MG 24 hr tablet Take 1 tablet (500 mg total) by mouth daily with breakfast. 90 tablet 0   mycophenolate (CELLCEPT) 500 MG tablet Take 1,000 mg by mouth 2 (two) times daily.     olmesartan (BENICAR) 40 MG tablet Take 1 tablet by mouth once daily 90 tablet 0   ondansetron (ZOFRAN) 4 MG tablet Take 1 tablet (4 mg total) by mouth every 8 (eight) hours as needed for nausea or vomiting. 20 tablet 0   pantoprazole (PROTONIX) 20 MG tablet Take 1 tablet (20 mg total) by mouth daily. 90 tablet 3   potassium chloride SA (KLOR-CON M20) 20 MEQ tablet Take 1 tablet (20 mEq total) by mouth daily. 90 tablet 1   sodium chloride 1 g tablet Take 1 tablet (1 g total) by mouth daily. 90 tablet 1   tamsulosin (FLOMAX) 0.4 MG CAPS capsule TAKE 1 CAPSULE BY MOUTH ONCE DAILY AFTER   SUPPER 90 capsule 3   No current facility-administered medications for this visit.    OBJECTIVE: Vitals:   01/09/24 1414  BP: (!) 116/52  Pulse: 70  Resp: 16  Temp: 99.2 F (37.3 C)  SpO2: 98%     Body mass index is 28.27 kg/m.    ECOG FS:1 - Symptomatic but completely ambulatory  General: Well-developed, well-nourished, no acute distress.  Sitting in a wheelchair. Eyes: Pink conjunctiva, anicteric sclera. HEENT: Normocephalic, moist mucous membranes. Lungs: No audible wheezing or coughing. Heart: Regular rate and rhythm. Abdomen: Soft, nontender, no obvious distention. Musculoskeletal: No edema, cyanosis, or clubbing. Neuro: Alert, answering all questions appropriately. Cranial nerves grossly intact. Skin: No rashes or petechiae noted. Psych: Normal affect.  LAB RESULTS:  Lab Results  Component Value Date   NA 134 (L) 01/03/2024   K 4.2 01/03/2024   CL 98 01/03/2024   CO2 23 01/03/2024   GLUCOSE 151 (H) 01/03/2024   BUN 16 01/03/2024   CREATININE 0.75 01/03/2024   CALCIUM 8.7 (L) 01/03/2024   PROT 7.1 01/03/2024   ALBUMIN 3.6 01/03/2024   AST 17 01/03/2024   ALT 11 01/03/2024   ALKPHOS 43 01/03/2024   BILITOT 0.7 01/03/2024   GFRNONAA >60 01/03/2024   GFRAA >60 09/12/2015    Lab Results  Component Value Date   WBC 7.7 01/03/2024   NEUTROABS 5.5 01/03/2024   HGB 10.4 (L) 01/03/2024   HCT 32.0 (L) 01/03/2024   MCV 97.9 01/03/2024   PLT 395 01/03/2024   Lab Results  Component Value Date   IRON 59 01/03/2024   TIBC 232 (L) 01/03/2024   IRONPCTSAT 25 01/03/2024   Lab Results  Component Value Date   FERRITIN 409 (H) 01/03/2024     STUDIES: No results found.  ASSESSMENT: Iron deficiency anemia.  PLAN:    Iron deficiency anemia: Patient's hemoglobin remains decreased at 10.8, but relatively unchanged.  Iron stores continue to be within normal limits.  Ferritin is likely elevated secondary to acute phase reactant.  Previously, all of his other  laboratory work was also either negative or within normal limits.  He does not require additional IV Venofer today.  Patient last received treatment on February 15, 2023.  Return to clinic in 6 months with repeat laboratory, further evaluation, and continuation of treatment if needed.  If patient does not require Venofer at this time, he likely can be discharged from clinic.   Thrombocytosis: Resolved.  I spent a total of 20 minutes reviewing chart data, face-to-face evaluation with the patient, counseling and coordination of care as detailed above.   Patient expressed understanding and was in agreement with this plan. He also understands that He can call clinic at any time with any questions, concerns, or complaints.    Jeralyn Ruths, MD   01/09/2024 2:58 PM

## 2024-01-31 ENCOUNTER — Other Ambulatory Visit: Payer: Self-pay | Admitting: Internal Medicine

## 2024-02-28 ENCOUNTER — Ambulatory Visit (INDEPENDENT_AMBULATORY_CARE_PROVIDER_SITE_OTHER): Payer: Medicare Other | Admitting: Internal Medicine

## 2024-02-28 ENCOUNTER — Encounter: Payer: Self-pay | Admitting: Internal Medicine

## 2024-02-28 VITALS — BP 110/72 | HR 76 | Temp 98.0°F | Resp 16 | Ht 71.0 in | Wt 199.0 lb

## 2024-02-28 DIAGNOSIS — H6123 Impacted cerumen, bilateral: Secondary | ICD-10-CM | POA: Diagnosis not present

## 2024-02-28 DIAGNOSIS — D649 Anemia, unspecified: Secondary | ICD-10-CM

## 2024-02-28 DIAGNOSIS — N400 Enlarged prostate without lower urinary tract symptoms: Secondary | ICD-10-CM | POA: Diagnosis not present

## 2024-02-28 DIAGNOSIS — E871 Hypo-osmolality and hyponatremia: Secondary | ICD-10-CM

## 2024-02-28 DIAGNOSIS — H9190 Unspecified hearing loss, unspecified ear: Secondary | ICD-10-CM

## 2024-02-28 DIAGNOSIS — Z7984 Long term (current) use of oral hypoglycemic drugs: Secondary | ICD-10-CM

## 2024-02-28 DIAGNOSIS — E7800 Pure hypercholesterolemia, unspecified: Secondary | ICD-10-CM | POA: Diagnosis not present

## 2024-02-28 DIAGNOSIS — E1165 Type 2 diabetes mellitus with hyperglycemia: Secondary | ICD-10-CM

## 2024-02-28 DIAGNOSIS — E1142 Type 2 diabetes mellitus with diabetic polyneuropathy: Secondary | ICD-10-CM

## 2024-02-28 DIAGNOSIS — I10 Essential (primary) hypertension: Secondary | ICD-10-CM

## 2024-02-28 DIAGNOSIS — G7000 Myasthenia gravis without (acute) exacerbation: Secondary | ICD-10-CM | POA: Diagnosis not present

## 2024-02-28 NOTE — Progress Notes (Signed)
 Subjective:    Patient ID: [name], male    DOB: 1926/05/31, 89 y.o.   MRN: [id number]  Patient here for  Chief Complaint  Patient presents with   Medical Management of Chronic Issues    HPI Here for a scheduled follow up - follow up regarding hypertension, hyponatremia and diabetes. He is accompanied by his daughter-n-law.  History obtained from both of them. EMG - [facility] - There is electrodiagnostic evidence of a right greater than left lumbosacral plexopahty. There also is electrodiagnostic evidence of a background sensorimotor polyneuropathy. He is getting around ok. Able to do ADLs. Had f/u with urology 08/22/23 - continues on flomax  and finasteride . Had f/u with Dr Adrian Alba 01/09/24- IDA - hgb stable 10.8. stable. F/u 6 months. Overall he appears to be doing well. No chest pain. Breathing stable. States am blood sugars averaging 101-120. Keeping bowels moving - taking prune juice, metamucil, senna s and stool softener. This regimen works for him.    Past Medical History:  Diagnosis Date   Actinic keratosis 02/01/2021   midline anterior chin    Allergic rhinitis    Basal cell carcinoma 01/29/2007   Right mid forehead. Excised: 03/24/2007, margins free.   Basal cell carcinoma 11/15/2021   L eyebrow -schedule mohs   BPH (benign prostatic hypertrophy)    Degenerative arthritis    Diabetes mellitus (HCC)    GERD (gastroesophageal reflux disease)    HTN (hypertension)    Hx of basal cell carcinoma 06/29/2020   L crown scalp, EDC on 05/16/22   Hypercholesterolemia    HYPERTENSION, BENIGN 08/15/2010   Qualifier: Diagnosis of  By: Jerelene Monday MD, Tim     Iron  deficiency anemia    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 Excised 07/27/2014, residual focus AFX, margins free.   Squamous cell carcinoma of skin 04/01/2007   Right forehead, 2cm above mid brow. WD SCC wtih superficial infiltration. Excised: 05/14/2007,  margins free   Squamous cell carcinoma of skin 05/16/2016   Crown. WD SCC, ulcerated. Excised: 07/03/2016, residual MD SCC, deep margin involved. Excised: 07/17/2016, margins free.   TIA (transient ischemic attack) 09/13/2015   Urinary outflow obstruction    mild   Past Surgical History:  Procedure Laterality Date   CATARACT EXTRACTION Bilateral    PARTIAL HIP ARTHROPLASTY  01/2019   PILONIDAL CYST EXCISION  1951   removal   ROTATOR CUFF REPAIR  1995   SKIN CANCER EXCISION     multiple   SQUAMOUS CELL CARCINOMA EXCISION     behind head   TONSILLECTOMY AND ADENOIDECTOMY  1938   Family History  Problem Relation Age of Onset   Heart disease Father        heart atack   Alzheimer's disease Mother    CVA Brother    Social History   Socioeconomic History   Marital status: Widowed    Spouse name: Not on file   Number of children: Not on file   Years of education: Not on file   Highest education level: Bachelor's degree (e.g., BA, AB, BS)  Occupational History   Not on file  Tobacco Use   Smoking status: Never    Passive exposure: Never   Smokeless tobacco: Never  Vaping Use   Vaping status: Never Used  Substance and Sexual Activity   Alcohol use: No    Alcohol/week: 0.0 standard drinks of alcohol   Drug use: No   Sexual activity: Not  Currently    Birth control/protection: None  Other Topics Concern   Not on file  Social History Narrative   Retired, married. Walks everyday         Social Drivers of Health   Financial Resource Strain: Low Risk  (10/24/2023)   Overall Financial Resource Strain (CARDIA)    Difficulty of Paying Living Expenses: Not hard at all  Food Insecurity: No Food Insecurity (10/24/2023)   Hunger Vital Sign    Worried About Running Out of Food in the Last Year: Never true    Ran Out of Food in the Last Year: Never true  Transportation Needs: No Transportation Needs (10/24/2023)   PRAPARE - Administrator, Civil Service (Medical): No     Lack of Transportation (Non-Medical): No  Physical Activity: Unknown (10/24/2023)   Exercise Vital Sign    Days of Exercise per Week: 0 days    Minutes of Exercise per Session: Not on file  Stress: No Stress Concern Present (10/24/2023)   [employer] of Occupational Health - Occupational Stress Questionnaire    Feeling of Stress : Only a little  Social Connections: Moderately Integrated (10/24/2023)   Social Connection and Isolation Panel [NHANES]    Frequency of Communication with Friends and Family: More than three times a week    Frequency of Social Gatherings with Friends and Family: Three times a week    Attends Religious Services: More than 4 times per year    Active Member of Clubs or Organizations: Yes    Attends Banker Meetings: More than 4 times per year    Marital Status: Widowed     Review of Systems  Constitutional:  Negative for appetite change and unexpected weight change.  HENT:  Negative for congestion and sinus pressure.   Respiratory:  Negative for cough, chest tightness and shortness of breath.   Cardiovascular:  Negative for chest pain, palpitations and leg swelling.  Gastrointestinal:  Negative for abdominal pain, diarrhea, nausea and vomiting.  Genitourinary:  Negative for difficulty urinating and dysuria.  Musculoskeletal:  Negative for joint swelling and myalgias.  Skin:  Negative for color change and rash.  Neurological:  Negative for dizziness and headaches.  Psychiatric/Behavioral:  Negative for agitation and dysphoric mood.        Objective:     BP 110/72   Pulse 76   Temp 98 F (36.7 C)   Resp 16   Ht 5\' 11"  (1.803 m)   Wt 199 lb (90.3 kg)   SpO2 98%   BMI 27.75 kg/m  Wt Readings from Last 3 Encounters:  02/28/24 199 lb (90.3 kg)  01/09/24 197 lb (89.4 kg)  10/24/23 195 lb 9.6 oz (88.7 kg)    Physical Exam Vitals reviewed.  Constitutional:      General: He is not in acute distress.    Appearance: Normal  appearance. He is well-developed.  HENT:     Head: Normocephalic and atraumatic.     Right Ear: External ear normal.     Left Ear: External ear normal.     Mouth/Throat:     Pharynx: No oropharyngeal exudate or posterior oropharyngeal erythema.  Eyes:     General: No scleral icterus.       Right eye: No discharge.        Left eye: No discharge.     Conjunctiva/sclera: Conjunctivae normal.  Cardiovascular:     Rate and Rhythm: Normal rate and regular rhythm.  Pulmonary:  Effort: Pulmonary effort is normal. No respiratory distress.     Breath sounds: Normal breath sounds.  Abdominal:     General: Bowel sounds are normal.     Palpations: Abdomen is soft.     Tenderness: There is no abdominal tenderness.  Musculoskeletal:        General: No tenderness.     Cervical back: Neck supple. No tenderness.     Comments: Stable, no increased swelling.   Lymphadenopathy:     Cervical: No cervical adenopathy.  Skin:    Findings: No erythema or rash.  Neurological:     Mental Status: He is alert.  Psychiatric:        Mood and Affect: Mood normal.        Behavior: Behavior normal.         Outpatient Encounter Medications as of 02/28/2024  Medication Sig   Accu-Chek FastClix Lancets MISC USE TO TEST BLOOD SUGAR ONCE DAILY   amLODipine  (NORVASC ) 10 MG tablet Take 1 tablet (10 mg total) by mouth daily.   aspirin  81 MG EC tablet Take 81 mg by mouth daily.   calcium -vitamin D  (OSCAL WITH D) 500-200 MG-UNIT tablet Take 1 tablet by mouth 2 (two) times daily.   finasteride  (PROSCAR ) 5 MG tablet Take 1 tablet by mouth once daily   folic acid  (FOLVITE ) 1 MG tablet Take 1 tablet (1 mg total) by mouth daily.   furosemide  (LASIX ) 40 MG tablet Take 1 tablet (40 mg total) by mouth daily.   glucose blood (ACCU-CHEK GUIDE TEST) test strip Use to check blood sugars once daily. Dx E11.9   lovastatin  (MEVACOR ) 20 MG tablet Take 1 tablet (20 mg total) by mouth at bedtime.   metFORMIN  (GLUCOPHAGE -XR)  500 MG 24 hr tablet TAKE 1 TABLET BY MOUTH DAILY WITH BREAKFAST   mycophenolate  (CELLCEPT ) 500 MG tablet Take 1,000 mg by mouth 2 (two) times daily.   olmesartan  (BENICAR ) 40 MG tablet Take 1 tablet by mouth once daily   pantoprazole  (PROTONIX ) 20 MG tablet Take 1 tablet (20 mg total) by mouth daily.   potassium chloride  SA (KLOR-CON  M20) 20 MEQ tablet Take 1 tablet (20 mEq total) by mouth daily.   sodium chloride  1 g tablet Take 1 tablet (1 g total) by mouth daily.   tamsulosin  (FLOMAX ) 0.4 MG CAPS capsule TAKE 1 CAPSULE BY MOUTH ONCE DAILY AFTER  SUPPER   [DISCONTINUED] gabapentin  (NEURONTIN ) 100 MG capsule Take 1 capsule (100 mg total) by mouth at bedtime.   [DISCONTINUED] ondansetron  (ZOFRAN ) 4 MG tablet Take 1 tablet (4 mg total) by mouth every 8 (eight) hours as needed for nausea or vomiting.   No facility-administered encounter medications on file as of 02/28/2024.     Lab Results  Component Value Date   WBC 7.7 01/03/2024   HGB 10.4 (L) 01/03/2024   HCT 32.0 (L) 01/03/2024   PLT 395 01/03/2024   GLUCOSE 158 (H) 02/28/2024   CHOL 101 02/28/2024   TRIG 101 02/28/2024   HDL 37 (L) 02/28/2024   LDLCALC 46 02/28/2024   ALT 8 (L) 02/28/2024   AST 12 02/28/2024   NA 132 (L) 02/28/2024   K 4.7 02/28/2024   CL 98 02/28/2024   CREATININE 0.64 (L) 02/28/2024   BUN 14 02/28/2024   CO2 26 02/28/2024   TSH 2.22 10/24/2023   PSA 0.02 (L) 04/16/2018   HGBA1C 6.6 (H) 02/28/2024   MICROALBUR 1.2 10/24/2023    US  RENAL Result Date: 01/04/2023 CLINICAL DATA:  Recurrent UTIs EXAM: RENAL / URINARY TRACT ULTRASOUND COMPLETE COMPARISON:  None Available. FINDINGS: Right Kidney: Renal measurements: 10.6 x 5.8 x 5.6 cm = volume: 183 mL. Echogenicity within normal limits. No mass or hydronephrosis visualized. Left Kidney: Renal measurements: 10.8 x 4.4 x 5.8 cm = volume: 152 mL. Echogenicity within normal limits. No mass or hydronephrosis visualized. Bladder: Appears normal for degree of bladder  distention. Other: None. IMPRESSION: Normal study. Electronically Signed   By: Lorrene Rosser III M.D.   On: 01/04/2023 17:00       Assessment & Plan:  Type 2 diabetes mellitus with hyperglycemia, without long-term current use of insulin  [facility]) Assessment & Plan: Sugars as outlined. He is doing well controlling his sugars. Follow met b and A1c. Continues on metformin .  Lab Results  Component Value Date   HGBA1C 6.6 (H) 02/28/2024     Orders: -     Basic metabolic panel with GFR -     Hemoglobin A1c  Hypercholesterolemia Assessment & Plan: On lovastatin .  Low cholesterol diet and exercise.  Follow lipid panel and liver function tests.   Lab Results  Component Value Date   CHOL 101 02/28/2024   HDL 37 (L) 02/28/2024   LDLCALC 46 02/28/2024   TRIG 101 02/28/2024   CHOLHDL 2.7 02/28/2024     Orders: -     Hepatic function panel -     Lipid panel  HYPERTENSION, BENIGN Assessment & Plan: On amlodipine  and benicar .  Follow pressures.  Follow metabolic panel. No changes today.    Anemia, unspecified type Assessment & Plan:  F/u hematology 01/09/24 - hgb 10.8. hold further IV iron . Stable. Follow 6 months.    Benign prostatic hyperplasia, unspecified whether lower urinary tract symptoms present Assessment & Plan: Had f/u with urology 08/22/23 - continues on flomax  and finasteride .    Diabetic polyneuropathy associated with type 2 diabetes mellitus (HCC) Assessment & Plan: On gabapentin . Stable.    Hyponatremia Assessment & Plan: On lasix  40mg  q day now. Taking sodium tablets.  Follow sodium.    Myasthenia gravis [facility]) Assessment & Plan: Followed by neurology.  Stable.  Continue cellcept . Follow cbc.    Bilateral impacted cerumen Assessment & Plan: Persistent issue with cerumen impaction. Discussed repeat ear irrigation. Request referral to ENT. Daughter-n-law concerned regarding his hearing. Would like hearing checked as well.   Orders: -     Ambulatory  referral to ENT  Change in hearing, unspecified laterality Assessment & Plan: Persistent issue with cerumen impaction. Discussed repeat ear irrigation. Request referral to ENT. Daughter-n-law concerned regarding his hearing. Would like hearing checked as well.   Orders: -     Ambulatory referral to ENT     Dellar Fenton, MD

## 2024-02-29 LAB — LIPID PANEL
Cholesterol: 101 mg/dL (ref <200)
HDL: 37 mg/dL — ABNORMAL LOW (ref >=40)
LDL Cholesterol (Calc): 46 mg/dL
Non-HDL Cholesterol (Calc): 64 mg/dL (ref <130)
Total CHOL/HDL Ratio: 2.7 (calc) (ref <5.0)
Triglycerides: 101 mg/dL (ref <150)

## 2024-02-29 LAB — HEPATIC FUNCTION PANEL
AG Ratio: 1.2 (calc) (ref 1.0–2.5)
ALT: 8 U/L — ABNORMAL LOW (ref 9–46)
AST: 12 U/L (ref 10–35)
Albumin: 3.8 g/dL (ref 3.6–5.1)
Alkaline phosphatase (APISO): 46 U/L (ref 35–144)
Bilirubin, Direct: 0.1 mg/dL (ref 0.0–0.2)
Globulin: 3.2 g/dL (ref 1.9–3.7)
Indirect Bilirubin: 0.4 mg/dL (ref 0.2–1.2)
Total Bilirubin: 0.5 mg/dL (ref 0.2–1.2)
Total Protein: 7 g/dL (ref 6.1–8.1)

## 2024-02-29 LAB — BASIC METABOLIC PANEL WITH GFR
BUN/Creatinine Ratio: 22 (calc) (ref 6–22)
BUN: 14 mg/dL (ref 7–25)
CO2: 26 mmol/L (ref 20–32)
Calcium: 8.9 mg/dL (ref 8.6–10.3)
Chloride: 98 mmol/L (ref 98–110)
Creat: 0.64 mg/dL — ABNORMAL LOW (ref 0.70–1.22)
Glucose, Bld: 158 mg/dL — ABNORMAL HIGH (ref 65–99)
Potassium: 4.7 mmol/L (ref 3.5–5.3)
Sodium: 132 mmol/L — ABNORMAL LOW (ref 135–146)
eGFR: 87 mL/min/{1.73_m2} (ref >=60)

## 2024-02-29 LAB — HEMOGLOBIN A1C
Hgb A1c MFr Bld: 6.6 % — ABNORMAL HIGH (ref <5.7)
Mean Plasma Glucose: 143 mg/dL
eAG (mmol/L): 7.9 mmol/L

## 2024-03-01 ENCOUNTER — Encounter: Payer: Self-pay | Admitting: Internal Medicine

## 2024-03-01 DIAGNOSIS — H9190 Unspecified hearing loss, unspecified ear: Secondary | ICD-10-CM | POA: Insufficient documentation

## 2024-03-01 NOTE — Assessment & Plan Note (Signed)
 Persistent issue with cerumen impaction. Discussed repeat ear irrigation. Request referral to ENT. Daughter-n-law concerned regarding his hearing. Would like hearing checked as well.

## 2024-03-01 NOTE — Assessment & Plan Note (Signed)
 F/u hematology 01/09/24 - hgb 10.8. hold further IV iron . Stable. Follow 6 months.

## 2024-03-01 NOTE — Assessment & Plan Note (Signed)
 On lovastatin .  Low cholesterol diet and exercise.  Follow lipid panel and liver function tests.   Lab Results  Component Value Date   CHOL 101 02/28/2024   HDL 37 (L) 02/28/2024   LDLCALC 46 02/28/2024   TRIG 101 02/28/2024   CHOLHDL 2.7 02/28/2024

## 2024-03-01 NOTE — Assessment & Plan Note (Signed)
 On amlodipine  and benicar .  Follow pressures.  Follow metabolic panel. No changes today.

## 2024-03-01 NOTE — Assessment & Plan Note (Signed)
Followed by neurology.  Stable.  Continue cellcept.  Follow cbc.  

## 2024-03-01 NOTE — Assessment & Plan Note (Signed)
On gabapentin.  Stable.  

## 2024-03-01 NOTE — Assessment & Plan Note (Signed)
 Sugars as outlined. He is doing well controlling his sugars. Follow met b and A1c. Continues on metformin .  Lab Results  Component Value Date   HGBA1C 6.6 (H) 02/28/2024

## 2024-03-01 NOTE — Assessment & Plan Note (Signed)
 Had f/u with urology 08/22/23 - continues on flomax  and finasteride .

## 2024-03-01 NOTE — Assessment & Plan Note (Signed)
 On lasix 40mg  q day now. Taking sodium tablets.  Follow sodium.

## 2024-03-02 ENCOUNTER — Other Ambulatory Visit: Payer: Self-pay

## 2024-03-02 DIAGNOSIS — E871 Hypo-osmolality and hyponatremia: Secondary | ICD-10-CM

## 2024-03-12 DIAGNOSIS — G7001 Myasthenia gravis with (acute) exacerbation: Secondary | ICD-10-CM | POA: Diagnosis not present

## 2024-03-12 DIAGNOSIS — H02054 Trichiasis without entropian left upper eyelid: Secondary | ICD-10-CM | POA: Diagnosis not present

## 2024-03-16 ENCOUNTER — Other Ambulatory Visit

## 2024-03-19 ENCOUNTER — Encounter: Payer: Self-pay | Admitting: Oncology

## 2024-03-19 ENCOUNTER — Other Ambulatory Visit (INDEPENDENT_AMBULATORY_CARE_PROVIDER_SITE_OTHER)

## 2024-03-19 ENCOUNTER — Ambulatory Visit: Payer: Self-pay | Admitting: Internal Medicine

## 2024-03-19 DIAGNOSIS — E871 Hypo-osmolality and hyponatremia: Secondary | ICD-10-CM | POA: Diagnosis not present

## 2024-03-19 LAB — SODIUM: Sodium: 136 meq/L (ref 135–145)

## 2024-03-26 DIAGNOSIS — H903 Sensorineural hearing loss, bilateral: Secondary | ICD-10-CM | POA: Diagnosis not present

## 2024-03-26 DIAGNOSIS — H6123 Impacted cerumen, bilateral: Secondary | ICD-10-CM | POA: Diagnosis not present

## 2024-04-09 ENCOUNTER — Encounter: Payer: Self-pay | Admitting: Podiatry

## 2024-04-09 ENCOUNTER — Ambulatory Visit (INDEPENDENT_AMBULATORY_CARE_PROVIDER_SITE_OTHER): Payer: Medicare Other | Admitting: Podiatry

## 2024-04-09 DIAGNOSIS — M79675 Pain in left toe(s): Secondary | ICD-10-CM

## 2024-04-09 DIAGNOSIS — M79674 Pain in right toe(s): Secondary | ICD-10-CM | POA: Diagnosis not present

## 2024-04-09 DIAGNOSIS — B351 Tinea unguium: Secondary | ICD-10-CM | POA: Diagnosis not present

## 2024-04-09 DIAGNOSIS — E1142 Type 2 diabetes mellitus with diabetic polyneuropathy: Secondary | ICD-10-CM

## 2024-04-16 NOTE — Progress Notes (Signed)
  Subjective:  Patient ID: [name], male    DOB: 05-Nov-1926,  MRN: [id number]  [name] presents to clinic today for at risk foot care with history of diabetic neuropathy and painful elongated mycotic toenails 1-5 bilaterally which are tender when wearing enclosed shoe gear. Pain is relieved with periodic professional debridement.  New problem(s): None.   PCP is Dellar Fenton, MD. [name] 02/28/2024.  Allergies  Allergen Reactions   Magnesium  Other (See Comments)    Myasthenia gravis   Penicillamine Other (See Comments)    Myasthenia gravis    Review of Systems: Negative except as noted in the HPI.  Objective: No changes noted in today's physical examination. There were no vitals filed for this visit. [name] is a pleasant 89 y.o. male WD, WN in NAD. AAO x 3.  Vascular Examination: CFT <3 seconds b/l. DP/PT pulses faintly palpable b/l. Skin temperature gradient warm to warm b/l. No pain with calf compression. No ischemia or gangrene. No cyanosis or clubbing noted b/l. No edema noted b/l LE.   Neurological Examination: Pt has subjective symptoms of neuropathy. Protective sensation diminished with 10g monofilament b/l.  Dermatological Examination: No open wounds. No interdigital macerations.  Toenails 1-5 b/l thick, discolored, elongated with subungual debris and pain on dorsal palpation.    Pedal skin thin, shiny and atrophic b/l LE. No corns, calluses nor porokeratotic lesions noted.  Musculoskeletal Examination: Dropfoot b/l lower extremities.  Radiographs: None  Last A1c:      Latest Ref Rng & Units 02/28/2024    2:03 PM 10/24/2023    1:46 PM  Hemoglobin A1C  Hemoglobin-A1c <5.7 % 6.6  6.5    Assessment/Plan: 1. Pain due to onychomycosis of toenails of both feet   2. Diabetic polyneuropathy associated with type 2 diabetes mellitus [facility])     Consent given for treatment. Patient examined. All patient's and/or POA's questions/concerns  addressed on today's visit. Mycotic toenails 1-5 debrided in length and girth without incident. Continue foot and shoe inspections daily. Monitor blood glucose per PCP/Endocrinologist's recommendations.Continue soft, supportive shoe gear daily. Report any pedal injuries to medical professional. Call office if there are any quesitons/concerns. -Patient/POA to call should there be question/concern in the interim.   Return in about 3 months (around 07/10/2024).  Luella Sager, DPM      [place] LOCATION: [address], [place] [address]                   Office [phone]   [facility] LOCATION: [address] [place], [place] [address] Office [phone]

## 2024-04-21 DIAGNOSIS — G7000 Myasthenia gravis without (acute) exacerbation: Secondary | ICD-10-CM | POA: Diagnosis not present

## 2024-05-02 ENCOUNTER — Other Ambulatory Visit: Payer: Self-pay | Admitting: Internal Medicine

## 2024-05-28 ENCOUNTER — Other Ambulatory Visit: Payer: Self-pay | Admitting: Internal Medicine

## 2024-05-28 DIAGNOSIS — I10 Essential (primary) hypertension: Secondary | ICD-10-CM

## 2024-06-05 ENCOUNTER — Encounter: Payer: Self-pay | Admitting: Urology

## 2024-06-11 DIAGNOSIS — H02054 Trichiasis without entropian left upper eyelid: Secondary | ICD-10-CM | POA: Diagnosis not present

## 2024-06-11 DIAGNOSIS — G7001 Myasthenia gravis with (acute) exacerbation: Secondary | ICD-10-CM | POA: Diagnosis not present

## 2024-06-12 ENCOUNTER — Other Ambulatory Visit: Payer: Self-pay | Admitting: Internal Medicine

## 2024-06-18 ENCOUNTER — Other Ambulatory Visit: Payer: Self-pay | Admitting: Internal Medicine

## 2024-06-25 ENCOUNTER — Ambulatory Visit (INDEPENDENT_AMBULATORY_CARE_PROVIDER_SITE_OTHER): Payer: BLUE CROSS/BLUE SHIELD | Admitting: Dermatology

## 2024-06-25 ENCOUNTER — Encounter: Payer: Self-pay | Admitting: Dermatology

## 2024-06-25 ENCOUNTER — Other Ambulatory Visit: Payer: Self-pay | Admitting: Internal Medicine

## 2024-06-25 DIAGNOSIS — L820 Inflamed seborrheic keratosis: Secondary | ICD-10-CM | POA: Diagnosis not present

## 2024-06-25 DIAGNOSIS — L720 Epidermal cyst: Secondary | ICD-10-CM | POA: Diagnosis not present

## 2024-06-25 DIAGNOSIS — L570 Actinic keratosis: Secondary | ICD-10-CM | POA: Diagnosis not present

## 2024-06-25 DIAGNOSIS — L578 Other skin changes due to chronic exposure to nonionizing radiation: Secondary | ICD-10-CM | POA: Diagnosis not present

## 2024-06-25 DIAGNOSIS — Z85828 Personal history of other malignant neoplasm of skin: Secondary | ICD-10-CM | POA: Diagnosis not present

## 2024-06-25 DIAGNOSIS — Z1283 Encounter for screening for malignant neoplasm of skin: Secondary | ICD-10-CM

## 2024-06-25 DIAGNOSIS — W908XXA Exposure to other nonionizing radiation, initial encounter: Secondary | ICD-10-CM

## 2024-06-25 DIAGNOSIS — L729 Follicular cyst of the skin and subcutaneous tissue, unspecified: Secondary | ICD-10-CM

## 2024-06-25 DIAGNOSIS — D1801 Hemangioma of skin and subcutaneous tissue: Secondary | ICD-10-CM

## 2024-06-25 DIAGNOSIS — Z8589 Personal history of malignant neoplasm of other organs and systems: Secondary | ICD-10-CM

## 2024-06-25 DIAGNOSIS — D692 Other nonthrombocytopenic purpura: Secondary | ICD-10-CM

## 2024-06-25 NOTE — Patient Instructions (Signed)

## 2024-06-25 NOTE — Progress Notes (Signed)
 Follow-Up Visit   Subjective  [name] is a 89 y.o. male who presents for the following: Skin Cancer Screening and Upper Body Skin Exam  The patient presents for Upper Body Skin Exam (UBSE) for skin cancer screening and mole check. The patient has spots, moles and lesions to be evaluated, some may be new or changing and the patient may have concern these could be cancer.  The following portions of the chart were reviewed this encounter and updated as appropriate: medications, allergies, medical history  Review of Systems:  No other skin or systemic complaints except as noted in HPI or Assessment and Plan.  Objective  Well appearing patient in no apparent distress; mood and affect are within normal limits.  All skin waist up examined. Relevant physical exam findings are noted in the Assessment and Plan.  scalp/face/ears x 15 (15) Erythematous thin papules/macules with gritty scale.  neck/shoulders/chest x 18 (18) Stuck-on, waxy, tan-brown papule -- Discussed benign etiology and prognosis.   Assessment & Plan   AK (ACTINIC KERATOSIS) (15) scalp/face/ears x 15 (15)  Actinic keratoses are precancerous spots that appear secondary to cumulative UV radiation exposure/sun exposure over time. They are chronic with expected duration over 1 year. A portion of actinic keratoses will progress to squamous cell carcinoma of the skin. It is not possible to reliably predict which spots will progress to skin cancer and so treatment is recommended to prevent development of skin cancer.  Recommend daily broad spectrum sunscreen SPF 30+ to sun-exposed areas, reapply every 2 hours as needed.  Recommend staying in the shade or wearing long sleeves, sun glasses (UVA+UVB protection) and wide brim hats (4-inch brim around the entire circumference of the hat). Call for new or changing lesions. Destruction of lesion - scalp/face/ears x 15 (15) Complexity: simple   Destruction method: cryotherapy    Informed consent: discussed and consent obtained   Timeout:  patient name, date of birth, surgical site, and procedure verified Lesion destroyed using liquid nitrogen: Yes   Region frozen until ice ball extended beyond lesion: Yes   Outcome: patient tolerated procedure well with no complications   Post-procedure details: wound care instructions given    INFLAMED SEBORRHEIC KERATOSIS (18) neck/shoulders/chest x 18 (18) Symptomatic, irritating, patient would like treated.  Destruction of lesion - neck/shoulders/chest x 18 (18) Complexity: simple   Destruction method: cryotherapy   Informed consent: discussed and consent obtained   Timeout:  patient name, date of birth, surgical site, and procedure verified Lesion destroyed using liquid nitrogen: Yes   Region frozen until ice ball extended beyond lesion: Yes   Outcome: patient tolerated procedure well with no complications   Post-procedure details: wound care instructions given    ACTINIC SKIN DAMAGE   SKIN CANCER SCREENING   HISTORY OF BASAL CELL CARCINOMA   HISTORY OF SQUAMOUS CELL CARCINOMA   PURPURA (HCC)   CYST OF SKIN     Skin cancer screening performed today.  Actinic Damage - Chronic condition, secondary to cumulative UV/sun exposure - diffuse scaly erythematous macules with underlying dyspigmentation - Recommend daily broad spectrum sunscreen SPF 30+ to sun-exposed areas, reapply every 2 hours as needed.  - Staying in the shade or wearing long sleeves, sun glasses (UVA+UVB protection) and wide brim hats (4-inch brim around the entire circumference of the hat) are also recommended for sun protection.  - Call for new or changing lesions.  Lentigines, Seborrheic Keratoses, Hemangiomas - Benign normal skin lesions - Benign-appearing - Call for any changes  Melanocytic Nevi - Tan-brown and/or pink-flesh-colored symmetric macules and papules - Benign appearing on exam today - Observation - Call clinic for  new or changing moles - Recommend daily use of broad spectrum spf 30+ sunscreen to sun-exposed areas.   HISTORY OF BASAL CELL CARCINOMA OF THE SKIN - No evidence of recurrence today - Recommend regular full body skin exams - Recommend daily broad spectrum sunscreen SPF 30+ to sun-exposed areas, reapply every 2 hours as needed.  - Call if any new or changing lesions are noted between office visits  HISTORY OF SQUAMOUS CELL CARCINOMA OF THE SKIN - No evidence of recurrence today - No lymphadenopathy - Recommend regular full body skin exams - Recommend daily broad spectrum sunscreen SPF 30+ to sun-exposed areas, reapply every 2 hours as needed.  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 Excised 07/27/2014, residual focus AFX, margins free.  Clear today.  Purpura - Chronic; persistent and recurrent.  Treatable, but not curable. - Violaceous macules and patches - Benign - Related to trauma, age, sun damage and/or use of blood thinners, chronic use of topical and/or oral steroids - Observe - Can use OTC arnica containing moisturizer such as Dermend Bruise Formula if desired - Call for worsening or other concerns  EPIDERMAL INCLUSION CYST Exam: Subcutaneous nodule at L post neck 1.5 cm, R post base of neck 1.0 cm, R lat chest at side 0.6 cm Benign-appearing. Exam most consistent with an epidermal inclusion cyst. Discussed that a cyst is a benign growth that can grow over time and sometimes get irritated or inflamed. Recommend observation if it is not bothersome. Discussed option of surgical excision to remove it if it is growing, symptomatic, or other changes noted. Please call for new or changing lesions so they can be evaluated.  Return in about 1 year (around 06/25/2025) for UBSE - Hx BCC, SCC, AK, malignant spindle cell proliferation with atypical fibroxanthoma.  [name],  CMA, am acting as scribe for Alm Rhyme, MD .   Documentation: I have reviewed the above documentation for accuracy and completeness, and I agree with the above.  Alm Rhyme, MD

## 2024-07-02 ENCOUNTER — Encounter: Payer: Self-pay | Admitting: Internal Medicine

## 2024-07-02 ENCOUNTER — Ambulatory Visit (INDEPENDENT_AMBULATORY_CARE_PROVIDER_SITE_OTHER): Admitting: Internal Medicine

## 2024-07-02 VITALS — BP 118/68 | HR 76 | Resp 16 | Ht 71.0 in | Wt 198.2 lb

## 2024-07-02 DIAGNOSIS — D509 Iron deficiency anemia, unspecified: Secondary | ICD-10-CM | POA: Diagnosis not present

## 2024-07-02 DIAGNOSIS — I10 Essential (primary) hypertension: Secondary | ICD-10-CM | POA: Diagnosis not present

## 2024-07-02 DIAGNOSIS — E1165 Type 2 diabetes mellitus with hyperglycemia: Secondary | ICD-10-CM

## 2024-07-02 DIAGNOSIS — M21379 Foot drop, unspecified foot: Secondary | ICD-10-CM

## 2024-07-02 DIAGNOSIS — R531 Weakness: Secondary | ICD-10-CM | POA: Diagnosis not present

## 2024-07-02 DIAGNOSIS — E1142 Type 2 diabetes mellitus with diabetic polyneuropathy: Secondary | ICD-10-CM | POA: Diagnosis not present

## 2024-07-02 DIAGNOSIS — E7800 Pure hypercholesterolemia, unspecified: Secondary | ICD-10-CM

## 2024-07-02 DIAGNOSIS — E871 Hypo-osmolality and hyponatremia: Secondary | ICD-10-CM | POA: Diagnosis not present

## 2024-07-02 DIAGNOSIS — G7000 Myasthenia gravis without (acute) exacerbation: Secondary | ICD-10-CM | POA: Diagnosis not present

## 2024-07-02 DIAGNOSIS — D649 Anemia, unspecified: Secondary | ICD-10-CM | POA: Diagnosis not present

## 2024-07-02 LAB — HM DIABETES FOOT EXAM

## 2024-07-02 NOTE — Progress Notes (Unsigned)
 Cardiology Office Note  Date:  07/03/2024   ID:  BRANNON LEVENE, DOB June 23, 1926, MRN [id number]  PCP:  Glendia Shad, MD   Chief Complaint  Patient presents with   12 month follow up     Doing well.     HPI:  [name] is a 89 yo male with a h/o  Myasthenia Gravis, severe muscle weakness, arms, occular, facial HTN,  diabetes, hemoglobin A1c 6.6 obesity ,  hyperlipidemia,  pernicious anemia, palpitations    Hyponatremiachronic drop foot Presenting for routine followup of his hypertension, diabetes  LOV 10/22 In follow-up today reports feeling well At baseline has poor balance, no falls Has walker, wheelchair at home Family is local, assist him with ADLs Uses Meals on Wheels  Denies significant chest pain or shortness of breath on exertion  Lab work reviewed, Sodium stable 138  Prior imaging reviewed Echocardiogram 1/24 Normal cardiac function No significant valve disease No significant pulmonary hypertension  Last seen 07/28/2019 Fall with hip fracture, Had surgery   EKG personally reviewed by myself on todays visit EKG Interpretation Date/Time:  Friday July 03 2024 13:44:14 EDT Ventricular Rate:  77 PR Interval:  234 QRS Duration:  98 QT Interval:  388 QTC Calculation: 439 R Axis:   -66  Text Interpretation: Sinus rhythm with 1st degree A-V block Left anterior fascicular block When compared with ECG of 06-Nov-2022 15:39, Fusion complexes are no longer Present Premature ventricular complexes are no longer Present Confirmed by Perla Lye [phone]) on 07/03/2024 1:46:04 PM    Other lab work reviewed HBA1c 7.8  Other PMH reviewed diagnosis of Myasthenia Gravis severe muscle weakness, arms, occular, facial had 5 treatments over 2 weeks, QOD On cellcept  since 11/06/16 Wife died 2017-10-06, severe Parkinson's  PMH:   has a past medical history of Actinic keratosis (02/01/2021), Allergic rhinitis, Basal cell carcinoma (01/29/2007), Basal cell  carcinoma (11/15/2021), BPH (benign prostatic hypertrophy), Degenerative arthritis, Diabetes mellitus (HCC), GERD (gastroesophageal reflux disease), HTN (hypertension), basal cell carcinoma (06/29/2020), Hypercholesterolemia, HYPERTENSION, BENIGN (08/15/2010), Iron  deficiency anemia, Skin cancer (06/23/2014), Squamous cell carcinoma of skin (04/01/2007), Squamous cell carcinoma of skin (05/16/2016), TIA (transient ischemic attack) (09/13/2015), and Urinary outflow obstruction.  PSH:    Past Surgical History:  Procedure Laterality Date   CATARACT EXTRACTION Bilateral    PARTIAL HIP ARTHROPLASTY  01/2019   PILONIDAL CYST EXCISION  1951   removal   ROTATOR CUFF REPAIR  1995   SKIN CANCER EXCISION     multiple   SQUAMOUS CELL CARCINOMA EXCISION     behind head   TONSILLECTOMY AND ADENOIDECTOMY  1938    Current Outpatient Medications  Medication Sig Dispense Refill   Accu-Chek FastClix Lancets MISC USE TO TEST BLOOD SUGAR ONCE DAILY 102 each 12   amLODipine  (NORVASC ) 10 MG tablet Take 1 tablet (10 mg total) by mouth daily. 90 tablet 1   aspirin  81 MG EC tablet Take 81 mg by mouth daily.     calcium -vitamin D  (OSCAL WITH D) 500-200 MG-UNIT tablet Take 1 tablet by mouth 2 (two) times daily.     finasteride  (PROSCAR ) 5 MG tablet Take 1 tablet by mouth once daily 90 tablet 3   folic acid  (FOLVITE ) 1 MG tablet TAKE 1 TABLET BY MOUTH DAILY 90 tablet 1   furosemide  (LASIX ) 40 MG tablet TAKE 1 TABLET BY MOUTH DAILY 90 tablet 1   glucose blood (ACCU-CHEK GUIDE TEST) test strip Use to check blood sugars once daily. Dx E11.9 100 each 12  lovastatin  (MEVACOR ) 20 MG tablet Take 1 tablet (20 mg total) by mouth at bedtime. 90 tablet 3   metFORMIN  (GLUCOPHAGE -XR) 500 MG 24 hr tablet TAKE 1 TABLET BY MOUTH DAILY WITH BREAKFAST 90 tablet 3   mycophenolate  (CELLCEPT ) 500 MG tablet Take 1,000 mg by mouth 2 (two) times daily.     olmesartan  (BENICAR ) 40 MG tablet TAKE 1 TABLET BY MOUTH DAILY 90 tablet 0    pantoprazole  (PROTONIX ) 20 MG tablet Take 1 tablet (20 mg total) by mouth daily. 90 tablet 3   potassium chloride  SA (KLOR-CON  M) 20 MEQ tablet TAKE 1 TABLET BY MOUTH DAILY 90 tablet 1   sodium chloride  1 g tablet TAKE 1 TABLET BY MOUTH DAILY 90 tablet 1   tamsulosin  (FLOMAX ) 0.4 MG CAPS capsule TAKE 1 CAPSULE BY MOUTH ONCE DAILY AFTER  SUPPER 90 capsule 3   No current facility-administered medications for this visit.    Allergies:   Magnesium  and Penicillamine   Social History:  The patient  reports that he has never smoked. He has never been exposed to tobacco smoke. He has never used smokeless tobacco. He reports that he does not drink alcohol and does not use drugs.   Family History:   family history includes Alzheimer's disease in his mother; CVA in his brother; Heart disease in his father.   Review of Systems: Review of Systems  Constitutional: Negative.   Respiratory: Negative.    Cardiovascular:  Positive for leg swelling.  Gastrointestinal: Negative.   Musculoskeletal: Negative.   Neurological: Negative.   Psychiatric/Behavioral: Negative.    All other systems reviewed and are negative.  PHYSICAL EXAM: VS:  BP (!) 118/44 (BP Location: Left Arm, Patient Position: Sitting, Cuff Size: Normal)   Pulse 77   Ht 5' 11 (1.803 m)   Wt 198 lb (89.8 kg)   SpO2 96%   BMI 27.62 kg/m  , BMI Body mass index is 27.62 kg/m. Constitutional:  oriented to person, place, and time. No distress.  HENT:  Head: Grossly normal Eyes:  no discharge. No scleral icterus.  Neck: No JVD, no carotid bruits  Cardiovascular: Regular rate and rhythm, no murmurs appreciated Pulmonary/Chest: Clear to auscultation bilaterally, no wheezes or rales Abdominal: Soft.  no distension.  no tenderness.  Musculoskeletal: Normal range of motion Neurological:  normal muscle tone. Coordination normal. No atrophy Skin: Skin warm and dry Psychiatric: normal affect, pleasant  Recent Labs: 10/24/2023: TSH  2.22 01/03/2024: Hemoglobin 10.4; Platelet Count 395 07/02/2024: ALT 10; BUN 16; Creatinine, Ser 0.73; Potassium 4.6; Sodium 138    Lipid Panel Lab Results  Component Value Date   CHOL 78 07/02/2024   HDL 32.70 (L) 07/02/2024   LDLCALC 29 07/02/2024   TRIG 82.0 07/02/2024    Wt Readings from Last 3 Encounters:  07/03/24 198 lb (89.8 kg)  07/02/24 198 lb 3.2 oz (89.9 kg)  02/28/24 199 lb (90.3 kg)     ASSESSMENT AND PLAN:  Hypercholesterolemia Cholesterol is at goal on the current lipid regimen. No changes to the medications were made.  HYPERTENSION, BENIGN Blood pressure is well controlled on today's visit. No changes made to the medications.  Type 2 diabetes mellitus without complication, without long-term current use of insulin  (HCC) Weight down from 214 now 198 A1c well-controlled Using Meals on Wheels  Myasthenia gravis (HCC) On CellCept  Reports he is stable  Hyponatremia Sodium stable 138 (on sodium pill daily)  Chronic diastolic CHF Appears euvolemic, weight down No changes to medications  Leg  swelling Mild dependent edema,  Leg elevation Consider compression hose   Orders Placed This Encounter  Procedures   EKG 12-Lead      Signed, Velinda Lunger, M.D., Ph.D. 07/03/2024  [facility] [place], [place] [phone]

## 2024-07-02 NOTE — Progress Notes (Signed)
 Subjective:    Patient ID: [name], male    DOB: 27-Jun-1926, 89 y.o.   MRN: [id number]  Patient here for  Chief Complaint  Patient presents with   Medical Management of Chronic Issues    HPI Here for a scheduled follow up -  follow up regarding hypertension, hyponatremia and diabetes. He is accompanied by his daughter-n-law.  History obtained from both of them. EMG - [facility] - There is electrodiagnostic evidence of a right greater than left lumbosacral plexopahty. There also is electrodiagnostic evidence of a background sensorimotor polyneuropathy.  Had f/u with urology 08/22/23 - continues on flomax  and finasteride . Had f/u with Dr Jacobo 01/09/24- IDA - hgb stable 10.8. stable. F/u 6 months. Saw neurology 04/21/24 f/u nyasthenia - stable. Continue cellcept . He reports he is doing relatively well. Has noticed some leg weakness/instability. 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 Breathing overall stable. No abdominal pain. Bowels moving.    Past Medical History:  Diagnosis Date   Actinic keratosis 02/01/2021   midline anterior chin    Allergic rhinitis    Basal cell carcinoma 01/29/2007   Right mid forehead. Excised: 03/24/2007, margins free.   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 Excised 07/27/2014, residual focus AFX, margins free.   Squamous cell carcinoma of skin 04/01/2007   Right forehead, 2cm above mid brow. WD SCC wtih superficial infiltration. Excised: 05/14/2007,  margins free   Squamous cell carcinoma of skin 05/16/2016   Crown. WD SCC, ulcerated. Excised: 07/03/2016, residual MD SCC, deep margin involved. Excised: 07/17/2016, margins free.   TIA (transient ischemic attack) 09/13/2015   Urinary outflow obstruction    mild   Past Surgical History:  Procedure Laterality Date   CATARACT EXTRACTION Bilateral    PARTIAL HIP ARTHROPLASTY  01/2019   PILONIDAL CYST EXCISION  1951   removal   ROTATOR CUFF REPAIR  1995   SKIN CANCER EXCISION     multiple   SQUAMOUS CELL CARCINOMA EXCISION     behind head   TONSILLECTOMY AND ADENOIDECTOMY  1938   Family History  Problem Relation Age of Onset   Heart disease Father        heart atack   Alzheimer's disease Mother    CVA Brother    Social History   Socioeconomic History   Marital status: Widowed    Spouse name: Not on file   Number of children: Not on file   Years of education: Not on file   Highest education level: Bachelor's degree (e.g., BA, AB, BS)  Occupational History   Not on file  Tobacco Use   Smoking status: Never    Passive exposure: Never   Smokeless tobacco: Never  Vaping Use   Vaping status: Never Used  Substance and Sexual Activity   Alcohol use: No    Alcohol/week: 0.0 standard drinks of alcohol   Drug use: No   Sexual activity: Not Currently    Birth  control/protection: None  Other Topics Concern   Not on file  Social History Narrative   Retired, married. Walks everyday         Social Drivers of Health   Financial Resource Strain: Low Risk  (10/24/2023)   Overall Financial Resource Strain (CARDIA)    Difficulty of Paying Living Expenses: Not hard at all  Food Insecurity: No Food Insecurity (10/24/2023)   Hunger Vital Sign    Worried About Running Out of Food in the Last Year: Never true    Ran Out of Food in the Last Year: Never true  Transportation Needs: No Transportation Needs (10/24/2023)   PRAPARE - Administrator, Civil Service (Medical): No     Lack of Transportation (Non-Medical): No  Physical Activity: Unknown (10/24/2023)   Exercise Vital Sign    Days of Exercise per Week: 0 days    Minutes of Exercise per Session: Not on file  Stress: No Stress Concern Present (10/24/2023)   [employer] of Occupational Health - Occupational Stress Questionnaire    Feeling of Stress : Only a little  Social Connections: Moderately Integrated (10/24/2023)   Social Connection and Isolation Panel    Frequency of Communication with Friends and Family: More than three times a week    Frequency of Social Gatherings with Friends and Family: Three times a week    Attends Religious Services: More than 4 times per year    Active Member of Clubs or Organizations: Yes    Attends Banker Meetings: More than 4 times per year    Marital Status: Widowed     Review of Systems  Constitutional:  Negative for appetite change and unexpected weight change.  HENT:  Negative for congestion and sinus pressure.   Respiratory:  Negative for cough and chest tightness.        Breathing stable.   Cardiovascular:  Negative for chest pain and palpitations.  Gastrointestinal:  Negative for abdominal pain, nausea and vomiting.  Genitourinary:  Negative for difficulty urinating and dysuria.  Musculoskeletal:  Negative for myalgias.  Skin:  Negative for color change and rash.  Neurological:  Negative for dizziness and headaches.  Psychiatric/Behavioral:  Negative for agitation and dysphoric mood.        Objective:     BP 118/68   Pulse 76   Resp 16   Ht 5' 11 (1.803 m)   Wt 198 lb 3.2 oz (89.9 kg)   SpO2 99%   BMI 27.64 kg/m  Wt Readings from Last 3 Encounters:  07/03/24 198 lb (89.8 kg)  07/02/24 198 lb 3.2 oz (89.9 kg)  02/28/24 199 lb (90.3 kg)    Physical Exam Vitals reviewed.  Constitutional:      General: He is not in acute distress.    Appearance: Normal appearance. He is well-developed.  HENT:     Head: Normocephalic  and atraumatic.     Right Ear: External ear normal.     Left Ear: External ear normal.     Mouth/Throat:     Pharynx: No oropharyngeal exudate or posterior oropharyngeal erythema.  Eyes:     General: No scleral icterus.       Right eye: No discharge.        Left eye: No discharge.     Conjunctiva/sclera: Conjunctivae normal.  Cardiovascular:     Rate and Rhythm: Normal rate and regular rhythm.  Pulmonary:     Effort: Pulmonary effort is normal. No respiratory distress.  Breath sounds: Normal breath sounds.  Abdominal:     General: Bowel sounds are normal.     Palpations: Abdomen is soft.     Tenderness: There is no abdominal tenderness.  Musculoskeletal:        General: No tenderness.     Cervical back: Neck supple. No tenderness.     Comments: No increased swelling. Feet - no lesions. Feel - light touch and pin prick.   Lymphadenopathy:     Cervical: No cervical adenopathy.  Skin:    Findings: No erythema or rash.  Neurological:     Mental Status: He is alert.  Psychiatric:        Mood and Affect: Mood normal.        Behavior: Behavior normal.         Outpatient Encounter Medications as of 07/02/2024  Medication Sig   Accu-Chek FastClix Lancets MISC USE TO TEST BLOOD SUGAR ONCE DAILY   amLODipine  (NORVASC ) 10 MG tablet Take 1 tablet (10 mg total) by mouth daily.   aspirin  81 MG EC tablet Take 81 mg by mouth daily.   calcium -vitamin D  (OSCAL WITH D) 500-200 MG-UNIT tablet Take 1 tablet by mouth 2 (two) times daily.   finasteride  (PROSCAR ) 5 MG tablet Take 1 tablet by mouth once daily   folic acid  (FOLVITE ) 1 MG tablet TAKE 1 TABLET BY MOUTH DAILY   furosemide  (LASIX ) 40 MG tablet TAKE 1 TABLET BY MOUTH DAILY   glucose blood (ACCU-CHEK GUIDE TEST) test strip Use to check blood sugars once daily. Dx E11.9   lovastatin  (MEVACOR ) 20 MG tablet Take 1 tablet (20 mg total) by mouth at bedtime.   metFORMIN  (GLUCOPHAGE -XR) 500 MG 24 hr tablet TAKE 1 TABLET BY MOUTH DAILY  WITH BREAKFAST   mycophenolate  (CELLCEPT ) 500 MG tablet Take 1,000 mg by mouth 2 (two) times daily.   olmesartan  (BENICAR ) 40 MG tablet TAKE 1 TABLET BY MOUTH DAILY   pantoprazole  (PROTONIX ) 20 MG tablet Take 1 tablet (20 mg total) by mouth daily.   potassium chloride  SA (KLOR-CON  M) 20 MEQ tablet TAKE 1 TABLET BY MOUTH DAILY   sodium chloride  1 g tablet TAKE 1 TABLET BY MOUTH DAILY   tamsulosin  (FLOMAX ) 0.4 MG CAPS capsule TAKE 1 CAPSULE BY MOUTH ONCE DAILY AFTER  SUPPER   No facility-administered encounter medications on file as of 07/02/2024.     Lab Results  Component Value Date   WBC 7.7 01/03/2024   HGB 10.4 (L) 01/03/2024   HCT 32.0 (L) 01/03/2024   PLT 395 01/03/2024   GLUCOSE 154 (H) 07/02/2024   CHOL 78 07/02/2024   TRIG 82.0 07/02/2024   HDL 32.70 (L) 07/02/2024   LDLCALC 29 07/02/2024   ALT 10 07/02/2024   AST 14 07/02/2024   NA 138 07/02/2024   K 4.6 07/02/2024   CL 102 07/02/2024   CREATININE 0.73 07/02/2024   BUN 16 07/02/2024   CO2 26 07/02/2024   TSH 2.22 10/24/2023   PSA 0.02 (L) 04/16/2018   HGBA1C 6.6 (H) 07/02/2024    US  RENAL Result Date: 01/04/2023 CLINICAL DATA:  Recurrent UTIs EXAM: RENAL / URINARY TRACT ULTRASOUND COMPLETE COMPARISON:  None Available. FINDINGS: Right Kidney: Renal measurements: 10.6 x 5.8 x 5.6 cm = volume: 183 mL. Echogenicity within normal limits. No mass or hydronephrosis visualized. Left Kidney: Renal measurements: 10.8 x 4.4 x 5.8 cm = volume: 152 mL. Echogenicity within normal limits. No mass or hydronephrosis visualized. Bladder: Appears normal for degree of bladder  distention. Other: None. IMPRESSION: Normal study. Electronically Signed   By: Alm Pouch III M.D.   On: 01/04/2023 17:00       Assessment & Plan:  Myasthenia gravis [facility]) Assessment & Plan: Followed by neurology.  Stable.  Continue cellcept . Follow cbc. Continue f/u with neurology.    Hypercholesterolemia Assessment & Plan: On lovastatin .  Low  cholesterol diet and exercise.  Follow lipid panel and liver function tests.  No changes in medication today.  Lab Results  Component Value Date   CHOL 78 07/02/2024   HDL 32.70 (L) 07/02/2024   LDLCALC 29 07/02/2024   TRIG 82.0 07/02/2024   CHOLHDL 2 07/02/2024     Orders: -     Lipid panel -     Hepatic function panel  Type 2 diabetes mellitus with hyperglycemia, without long-term current use of insulin  [facility]) Assessment & Plan: He is doing well controlling his sugars. Follow met b and A1c. Continues on metformin . No changes today.  Lab Results  Component Value Date   HGBA1C 6.6 (H) 07/02/2024     Orders: -     Hemoglobin A1c -     Basic metabolic panel with GFR  Iron  deficiency anemia, unspecified iron  deficiency anemia type Assessment & Plan: Seeing Dr Jacobo for IDA.  Receiving IV venofer . Continue to follow cbc and iron  studies.    Hyponatremia Assessment & Plan: Taking sodium tablet. On lasix . Follow sodium level.    HYPERTENSION, BENIGN Assessment & Plan: On amlodipine  and benicar .  Follow pressures.  Follow metabolic panel. No changes in medication today.    Foot-drop, unspecified laterality Assessment & Plan: Follow up with [facility] - recommended PT with electrical stimulation and if referred radicular pain - would be a candidate for epidural or other injections including ablation.  Has the brace. Stable.    Diabetic polyneuropathy associated with type 2 diabetes mellitus (HCC) Assessment & Plan: Continue gabapentin . Stable.    Anemia, unspecified type Assessment & Plan: Continue f/u with hematology.    Weakness Assessment & Plan: Has noticed legs not as strong. Some instability of gait. Agreeable to PT. He does not drive. Arrange home health PT -- to evaluate and treat.       Allena Hamilton, MD

## 2024-07-03 ENCOUNTER — Encounter: Payer: Self-pay | Admitting: Cardiovascular Disease

## 2024-07-03 ENCOUNTER — Ambulatory Visit: Attending: Cardiovascular Disease | Admitting: Cardiovascular Disease

## 2024-07-03 VITALS — BP 118/44 | HR 77 | Ht 71.0 in | Wt 198.0 lb

## 2024-07-03 DIAGNOSIS — E7800 Pure hypercholesterolemia, unspecified: Secondary | ICD-10-CM | POA: Diagnosis not present

## 2024-07-03 DIAGNOSIS — I10 Essential (primary) hypertension: Secondary | ICD-10-CM | POA: Insufficient documentation

## 2024-07-03 DIAGNOSIS — G7000 Myasthenia gravis without (acute) exacerbation: Secondary | ICD-10-CM | POA: Insufficient documentation

## 2024-07-03 DIAGNOSIS — G459 Transient cerebral ischemic attack, unspecified: Secondary | ICD-10-CM | POA: Insufficient documentation

## 2024-07-03 DIAGNOSIS — E1165 Type 2 diabetes mellitus with hyperglycemia: Secondary | ICD-10-CM | POA: Diagnosis not present

## 2024-07-03 DIAGNOSIS — I5032 Chronic diastolic (congestive) heart failure: Secondary | ICD-10-CM | POA: Diagnosis not present

## 2024-07-03 DIAGNOSIS — R0602 Shortness of breath: Secondary | ICD-10-CM | POA: Diagnosis not present

## 2024-07-03 LAB — BASIC METABOLIC PANEL WITH GFR
BUN: 16 mg/dL (ref 6–23)
CO2: 26 meq/L (ref 19–32)
Calcium: 8.7 mg/dL (ref 8.4–10.5)
Chloride: 102 meq/L (ref 96–112)
Creatinine, Ser: 0.73 mg/dL (ref 0.40–1.50)
GFR: 77.2 mL/min (ref >60.00)
Glucose, Bld: 154 mg/dL — ABNORMAL HIGH (ref 70–99)
Potassium: 4.6 meq/L (ref 3.5–5.1)
Sodium: 138 meq/L (ref 135–145)

## 2024-07-03 LAB — HEPATIC FUNCTION PANEL
ALT: 10 U/L (ref 0–53)
AST: 14 U/L (ref 0–37)
Albumin: 3.7 g/dL (ref 3.5–5.2)
Alkaline Phosphatase: 44 U/L (ref 39–117)
Bilirubin, Direct: 0.1 mg/dL (ref 0.0–0.3)
Total Bilirubin: 0.5 mg/dL (ref 0.2–1.2)
Total Protein: 6.9 g/dL (ref 6.0–8.3)

## 2024-07-03 LAB — LIPID PANEL
Cholesterol: 78 mg/dL (ref 0–200)
HDL: 32.7 mg/dL — ABNORMAL LOW (ref >39.00)
LDL Cholesterol: 29 mg/dL (ref 0–99)
NonHDL: 45.67
Total CHOL/HDL Ratio: 2
Triglycerides: 82 mg/dL (ref 0.0–149.0)
VLDL: 16.4 mg/dL (ref 0.0–40.0)

## 2024-07-03 LAB — HEMOGLOBIN A1C: Hgb A1c MFr Bld: 6.6 % — ABNORMAL HIGH (ref 4.6–6.5)

## 2024-07-03 NOTE — Patient Instructions (Signed)

## 2024-07-04 ENCOUNTER — Ambulatory Visit: Payer: Self-pay | Admitting: Internal Medicine

## 2024-07-08 ENCOUNTER — Encounter: Payer: Self-pay | Admitting: Internal Medicine

## 2024-07-08 DIAGNOSIS — R2681 Unsteadiness on feet: Secondary | ICD-10-CM

## 2024-07-08 DIAGNOSIS — R531 Weakness: Secondary | ICD-10-CM

## 2024-07-09 NOTE — Telephone Encounter (Signed)
 I am ok with additional PT.  If needs new home health referral, please confirm if has preference, please confirm when home health agency he prefers.

## 2024-07-11 NOTE — Telephone Encounter (Signed)
Order placed for home health PT.

## 2024-07-12 ENCOUNTER — Encounter: Payer: Self-pay | Admitting: Internal Medicine

## 2024-07-12 NOTE — Assessment & Plan Note (Signed)
 Has noticed legs not as strong. Some instability of gait. Agreeable to PT. He does not drive. Arrange home health PT -- to evaluate and treat.

## 2024-07-12 NOTE — Assessment & Plan Note (Signed)
Continue f/u with hematology

## 2024-07-12 NOTE — Assessment & Plan Note (Signed)
 Seeing Dr Jacobo for IDA.  Receiving IV venofer . Continue to follow cbc and iron  studies.

## 2024-07-12 NOTE — Assessment & Plan Note (Signed)
 Taking sodium tablet. On lasix . Follow sodium level.

## 2024-07-12 NOTE — Assessment & Plan Note (Signed)
 On lovastatin .  Low cholesterol diet and exercise.  Follow lipid panel and liver function tests.  No changes in medication today.  Lab Results  Component Value Date   CHOL 78 07/02/2024   HDL 32.70 (L) 07/02/2024   LDLCALC 29 07/02/2024   TRIG 82.0 07/02/2024   CHOLHDL 2 07/02/2024

## 2024-07-12 NOTE — Assessment & Plan Note (Signed)
 He is doing well controlling his sugars. Follow met b and A1c. Continues on metformin . No changes today.  Lab Results  Component Value Date   HGBA1C 6.6 (H) 07/02/2024

## 2024-07-12 NOTE — Assessment & Plan Note (Signed)
 Continue gabapentin.  Stable.

## 2024-07-12 NOTE — Assessment & Plan Note (Signed)
 Follow up with [facility] - recommended PT with electrical stimulation and if referred radicular pain - would be a candidate for epidural or other injections including ablation.  Has the brace. Stable.

## 2024-07-12 NOTE — Assessment & Plan Note (Signed)
 Followed by neurology.  Stable.  Continue cellcept . Follow cbc. Continue f/u with neurology.

## 2024-07-12 NOTE — Assessment & Plan Note (Signed)
 On amlodipine  and benicar .  Follow pressures.  Follow metabolic panel. No changes in medication today.

## 2024-07-13 ENCOUNTER — Other Ambulatory Visit

## 2024-07-14 ENCOUNTER — Ambulatory Visit

## 2024-07-14 ENCOUNTER — Ambulatory Visit: Admitting: Oncology

## 2024-07-15 ENCOUNTER — Other Ambulatory Visit: Payer: Self-pay

## 2024-07-15 ENCOUNTER — Telehealth: Payer: Self-pay

## 2024-07-15 DIAGNOSIS — Z7984 Long term (current) use of oral hypoglycemic drugs: Secondary | ICD-10-CM | POA: Diagnosis not present

## 2024-07-15 DIAGNOSIS — K219 Gastro-esophageal reflux disease without esophagitis: Secondary | ICD-10-CM | POA: Diagnosis not present

## 2024-07-15 DIAGNOSIS — Z8673 Personal history of transient ischemic attack (TIA), and cerebral infarction without residual deficits: Secondary | ICD-10-CM | POA: Diagnosis not present

## 2024-07-15 DIAGNOSIS — G7000 Myasthenia gravis without (acute) exacerbation: Secondary | ICD-10-CM | POA: Diagnosis not present

## 2024-07-15 DIAGNOSIS — Z96649 Presence of unspecified artificial hip joint: Secondary | ICD-10-CM | POA: Diagnosis not present

## 2024-07-15 DIAGNOSIS — E871 Hypo-osmolality and hyponatremia: Secondary | ICD-10-CM | POA: Diagnosis not present

## 2024-07-15 DIAGNOSIS — E7800 Pure hypercholesterolemia, unspecified: Secondary | ICD-10-CM | POA: Diagnosis not present

## 2024-07-15 DIAGNOSIS — Z85828 Personal history of other malignant neoplasm of skin: Secondary | ICD-10-CM | POA: Diagnosis not present

## 2024-07-15 DIAGNOSIS — Z7982 Long term (current) use of aspirin: Secondary | ICD-10-CM | POA: Diagnosis not present

## 2024-07-15 DIAGNOSIS — N138 Other obstructive and reflux uropathy: Secondary | ICD-10-CM | POA: Diagnosis not present

## 2024-07-15 DIAGNOSIS — M1990 Unspecified osteoarthritis, unspecified site: Secondary | ICD-10-CM | POA: Diagnosis not present

## 2024-07-15 DIAGNOSIS — I10 Essential (primary) hypertension: Secondary | ICD-10-CM | POA: Diagnosis not present

## 2024-07-15 DIAGNOSIS — D509 Iron deficiency anemia, unspecified: Secondary | ICD-10-CM

## 2024-07-15 DIAGNOSIS — E1142 Type 2 diabetes mellitus with diabetic polyneuropathy: Secondary | ICD-10-CM | POA: Diagnosis not present

## 2024-07-15 DIAGNOSIS — J309 Allergic rhinitis, unspecified: Secondary | ICD-10-CM | POA: Diagnosis not present

## 2024-07-15 DIAGNOSIS — E1165 Type 2 diabetes mellitus with hyperglycemia: Secondary | ICD-10-CM | POA: Diagnosis not present

## 2024-07-15 DIAGNOSIS — N401 Enlarged prostate with lower urinary tract symptoms: Secondary | ICD-10-CM | POA: Diagnosis not present

## 2024-07-15 NOTE — Telephone Encounter (Signed)
 Ok for PT?

## 2024-07-15 NOTE — Telephone Encounter (Signed)
 Copied from CRM [id number]. Topic: Clinical - Home Health Verbal Orders >> Jul 15, 2024  4:41 PM Viola FALCON wrote: Caller/Agency: Stacy from [facility]  Callback Number: [phone] Service Requested: Physical Therapy Frequency: 1x a week for 1wk, 2x a week for 3 weeks, 1x a week for 5 weeks Any new concerns about the patient? No

## 2024-07-16 ENCOUNTER — Ambulatory Visit: Admitting: Podiatry

## 2024-07-16 ENCOUNTER — Encounter: Payer: Self-pay | Admitting: Internal Medicine

## 2024-07-16 ENCOUNTER — Inpatient Hospital Stay: Attending: Oncology

## 2024-07-16 DIAGNOSIS — D509 Iron deficiency anemia, unspecified: Secondary | ICD-10-CM | POA: Diagnosis present

## 2024-07-16 DIAGNOSIS — Z79899 Other long term (current) drug therapy: Secondary | ICD-10-CM | POA: Insufficient documentation

## 2024-07-16 DIAGNOSIS — Z7984 Long term (current) use of oral hypoglycemic drugs: Secondary | ICD-10-CM | POA: Diagnosis not present

## 2024-07-16 DIAGNOSIS — E7800 Pure hypercholesterolemia, unspecified: Secondary | ICD-10-CM | POA: Diagnosis not present

## 2024-07-16 DIAGNOSIS — E119 Type 2 diabetes mellitus without complications: Secondary | ICD-10-CM | POA: Insufficient documentation

## 2024-07-16 DIAGNOSIS — Z7982 Long term (current) use of aspirin: Secondary | ICD-10-CM | POA: Diagnosis not present

## 2024-07-16 DIAGNOSIS — L570 Actinic keratosis: Secondary | ICD-10-CM | POA: Diagnosis not present

## 2024-07-16 DIAGNOSIS — Z85828 Personal history of other malignant neoplasm of skin: Secondary | ICD-10-CM | POA: Diagnosis not present

## 2024-07-16 DIAGNOSIS — Z8673 Personal history of transient ischemic attack (TIA), and cerebral infarction without residual deficits: Secondary | ICD-10-CM | POA: Diagnosis not present

## 2024-07-16 DIAGNOSIS — I10 Essential (primary) hypertension: Secondary | ICD-10-CM | POA: Insufficient documentation

## 2024-07-16 DIAGNOSIS — K219 Gastro-esophageal reflux disease without esophagitis: Secondary | ICD-10-CM | POA: Insufficient documentation

## 2024-07-16 LAB — CMP (CANCER CENTER ONLY)
ALT: 13 U/L (ref 0–44)
AST: 19 U/L (ref 15–41)
Albumin: 3.5 g/dL (ref 3.5–5.0)
Alkaline Phosphatase: 49 U/L (ref 38–126)
Anion gap: 11 (ref 5–15)
BUN: 14 mg/dL (ref 8–23)
CO2: 23 mmol/L (ref 22–32)
Calcium: 8.7 mg/dL — ABNORMAL LOW (ref 8.9–10.3)
Chloride: 96 mmol/L — ABNORMAL LOW (ref 98–111)
Creatinine: 0.67 mg/dL (ref 0.61–1.24)
GFR, Estimated: >60 mL/min (ref >60)
Glucose, Bld: 131 mg/dL — ABNORMAL HIGH (ref 70–99)
Potassium: 4.5 mmol/L (ref 3.5–5.1)
Sodium: 130 mmol/L — ABNORMAL LOW (ref 135–145)
Total Bilirubin: 0.6 mg/dL (ref 0.0–1.2)
Total Protein: 7.2 g/dL (ref 6.5–8.1)

## 2024-07-16 LAB — IRON AND TIBC
Iron: 55 ug/dL (ref 45–182)
Saturation Ratios: 23 % (ref 17.9–39.5)
TIBC: 244 ug/dL — ABNORMAL LOW (ref 250–450)
UIBC: 189 ug/dL

## 2024-07-16 LAB — CBC WITH DIFFERENTIAL (CANCER CENTER ONLY)
Abs Immature Granulocytes: 0.02 K/uL (ref 0.00–0.07)
Basophils Absolute: 0 K/uL (ref 0.0–0.1)
Basophils Relative: 1 %
Eosinophils Absolute: 0.1 K/uL (ref 0.0–0.5)
Eosinophils Relative: 1 %
HCT: 31.1 % — ABNORMAL LOW (ref 39.0–52.0)
Hemoglobin: 10.2 g/dL — ABNORMAL LOW (ref 13.0–17.0)
Immature Granulocytes: 0 %
Lymphocytes Relative: 17 %
Lymphs Abs: 1.3 K/uL (ref 0.7–4.0)
MCH: 31.7 pg (ref 26.0–34.0)
MCHC: 32.8 g/dL (ref 30.0–36.0)
MCV: 96.6 fL (ref 80.0–100.0)
Monocytes Absolute: 0.7 K/uL (ref 0.1–1.0)
Monocytes Relative: 9 %
Neutro Abs: 5.5 K/uL (ref 1.7–7.7)
Neutrophils Relative %: 72 %
Platelet Count: 394 K/uL (ref 150–400)
RBC: 3.22 MIL/uL — ABNORMAL LOW (ref 4.22–5.81)
RDW: 13.2 % (ref 11.5–15.5)
WBC Count: 7.7 K/uL (ref 4.0–10.5)
nRBC: 0 % (ref 0.0–0.2)

## 2024-07-16 LAB — FERRITIN: Ferritin: 267 ng/mL (ref 24–336)

## 2024-07-16 NOTE — Telephone Encounter (Signed)
Verbals given to [place].

## 2024-07-17 ENCOUNTER — Encounter: Payer: Self-pay | Admitting: Oncology

## 2024-07-17 ENCOUNTER — Inpatient Hospital Stay (HOSPITAL_BASED_OUTPATIENT_CLINIC_OR_DEPARTMENT_OTHER): Admitting: Oncology

## 2024-07-17 ENCOUNTER — Inpatient Hospital Stay

## 2024-07-17 ENCOUNTER — Telehealth: Payer: Self-pay

## 2024-07-17 VITALS — BP 135/52 | HR 69 | Temp 97.3°F | Resp 16 | Wt 203.0 lb

## 2024-07-17 DIAGNOSIS — D509 Iron deficiency anemia, unspecified: Secondary | ICD-10-CM

## 2024-07-17 DIAGNOSIS — L570 Actinic keratosis: Secondary | ICD-10-CM | POA: Diagnosis not present

## 2024-07-17 DIAGNOSIS — E7800 Pure hypercholesterolemia, unspecified: Secondary | ICD-10-CM | POA: Diagnosis not present

## 2024-07-17 DIAGNOSIS — I10 Essential (primary) hypertension: Secondary | ICD-10-CM | POA: Diagnosis not present

## 2024-07-17 DIAGNOSIS — E871 Hypo-osmolality and hyponatremia: Secondary | ICD-10-CM

## 2024-07-17 DIAGNOSIS — E119 Type 2 diabetes mellitus without complications: Secondary | ICD-10-CM | POA: Diagnosis not present

## 2024-07-17 DIAGNOSIS — K219 Gastro-esophageal reflux disease without esophagitis: Secondary | ICD-10-CM | POA: Diagnosis not present

## 2024-07-17 NOTE — Addendum Note (Signed)
 Addended by: LEARTA PORTO D on: 07/17/2024 04:25 PM   Modules accepted: Orders

## 2024-07-17 NOTE — Telephone Encounter (Signed)
 Confirm he is eating. Confirm no acute change in symptoms - worsening sob, increased fluid/weight. Need to confirm if he has had recent increased fluid intake. Last sodium check here was wnl.  Can decrease fluid intake by 1/3. Recheck sodium next week.

## 2024-07-17 NOTE — Telephone Encounter (Signed)
 Called patient to let him know message was received and sodium order placed.

## 2024-07-17 NOTE — Progress Notes (Signed)
 [facility]  Telephone:([phone]) [phone] Fax:(336) [phone]  ID: [name] OB: Jan 18, 1926  MR#: [id number]  RDW#:[id number]  Patient Care Team: Glendia Shad, MD as PCP - General (Unknown Physician Specialty) [name], MD as Consulting Physician (Cardiology) [name], MD as Consulting Physician (Oncology)  CHIEF COMPLAINT: Iron  deficiency anemia  INTERVAL HISTORY: Patient returns to clinic today for repeat laboratory, further evaluation, and consideration of additional IV Venofer .  He continues to feel well and remains asymptomatic.  He does not complain of any weakness or fatigue.  He has no neurologic complaints.  He denies any recent fevers or illnesses.  He has a good appetite and denies weight loss.  He has no chest pain, shortness of breath, cough, or hemoptysis.  He denies any nausea, vomiting, constipation, or diarrhea.  He has no melena or hematochezia.  He has no urinary complaints.  Patient offers no specific complaints today.  REVIEW OF SYSTEMS:   Review of Systems  Constitutional: Negative.  Negative for fever, malaise/fatigue and weight loss.  Respiratory: Negative.  Negative for cough, hemoptysis and shortness of breath.   Cardiovascular: Negative.  Negative for chest pain and leg swelling.  Gastrointestinal:  Negative for abdominal pain, blood in stool and melena.  Genitourinary: Negative.  Negative for hematuria.  Musculoskeletal: Negative.  Negative for back pain.  Skin: Negative.  Negative for rash.  Neurological: Negative.  Negative for dizziness, focal weakness, weakness and headaches.  Psychiatric/Behavioral: Negative.  The patient is not nervous/anxious.     As per HPI. Otherwise, a complete review of systems is negative.  PAST MEDICAL HISTORY: Past Medical History:  Diagnosis Date   Actinic keratosis 02/01/2021   midline anterior chin    Allergic rhinitis    Basal cell carcinoma 01/29/2007   Right mid  forehead. Excised: 03/24/2007, margins free.   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 Excised 07/27/2014, residual focus AFX, margins free.   Squamous cell carcinoma of skin 04/01/2007   Right forehead, 2cm above mid brow. WD SCC wtih superficial infiltration. Excised: 05/14/2007, margins free   Squamous cell carcinoma of skin 05/16/2016   Crown. WD SCC, ulcerated. Excised: 07/03/2016, residual MD SCC, deep margin involved. Excised: 07/17/2016, margins free.   TIA (transient ischemic attack) 09/13/2015   Urinary outflow obstruction    mild    PAST SURGICAL HISTORY: Past Surgical History:  Procedure Laterality Date   CATARACT EXTRACTION Bilateral    PARTIAL HIP ARTHROPLASTY  01/2019   PILONIDAL CYST EXCISION  1951   removal   ROTATOR CUFF REPAIR  1995   SKIN CANCER EXCISION     multiple   SQUAMOUS CELL CARCINOMA EXCISION     behind head   TONSILLECTOMY AND ADENOIDECTOMY  1938    FAMILY HISTORY: Family History  Problem Relation Age of Onset   Heart disease Father        heart atack   Alzheimer's disease Mother    CVA Brother  ADVANCED DIRECTIVES (Y/N):  N  HEALTH MAINTENANCE: Social History   Tobacco Use   Smoking status: Never    Passive exposure: Never   Smokeless tobacco: Never  Vaping Use   Vaping status: Never Used  Substance Use Topics   Alcohol use: No    Alcohol/week: 0.0 standard drinks of alcohol   Drug use: No     Colonoscopy:  PAP:  Bone density:  Lipid  panel:  Allergies  Allergen Reactions   Magnesium  Other (See Comments)    Myasthenia gravis   Penicillamine Other (See Comments)    Myasthenia gravis    Current Outpatient Medications  Medication Sig Dispense Refill   Accu-Chek FastClix Lancets MISC USE TO TEST BLOOD SUGAR ONCE DAILY 102 each 12   amLODipine  (NORVASC ) 10 MG tablet Take 1 tablet (10 mg total) by mouth daily. 90 tablet 1   aspirin  81 MG EC tablet Take 81 mg by mouth daily.     calcium -vitamin D  (OSCAL WITH D) 500-200 MG-UNIT tablet Take 1 tablet by mouth 2 (two) times daily.     finasteride  (PROSCAR ) 5 MG tablet Take 1 tablet by mouth once daily 90 tablet 3   folic acid  (FOLVITE ) 1 MG tablet TAKE 1 TABLET BY MOUTH DAILY 90 tablet 1   furosemide  (LASIX ) 40 MG tablet TAKE 1 TABLET BY MOUTH DAILY 90 tablet 1   glucose blood (ACCU-CHEK GUIDE TEST) test strip Use to check blood sugars once daily. Dx E11.9 100 each 12   lovastatin  (MEVACOR ) 20 MG tablet Take 1 tablet (20 mg total) by mouth at bedtime. 90 tablet 3   metFORMIN  (GLUCOPHAGE -XR) 500 MG 24 hr tablet TAKE 1 TABLET BY MOUTH DAILY WITH BREAKFAST 90 tablet 3   mycophenolate  (CELLCEPT ) 500 MG tablet Take 1,000 mg by mouth 2 (two) times daily.     olmesartan  (BENICAR ) 40 MG tablet TAKE 1 TABLET BY MOUTH DAILY 90 tablet 0   pantoprazole  (PROTONIX ) 20 MG tablet Take 1 tablet (20 mg total) by mouth daily. 90 tablet 3   potassium chloride  SA (KLOR-CON  M) 20 MEQ tablet TAKE 1 TABLET BY MOUTH DAILY 90 tablet 1   sodium chloride  1 g tablet TAKE 1 TABLET BY MOUTH DAILY 90 tablet 1   tamsulosin  (FLOMAX ) 0.4 MG CAPS capsule TAKE 1 CAPSULE BY MOUTH ONCE DAILY AFTER  SUPPER 90 capsule 3   No current facility-administered medications for this visit.    OBJECTIVE: Vitals:   07/17/24 1102  BP: (!) 135/52  Pulse: 69  Resp: 16  Temp: (!) 97.3 F (36.3 C)  SpO2: 98%     Body mass index is 28.31 kg/m.    ECOG FS:1 - Symptomatic but completely ambulatory  General:  Well-developed, well-nourished, no acute distress.  Sitting in a wheelchair. Eyes: Pink conjunctiva, anicteric sclera. HEENT: Normocephalic, moist mucous membranes. Lungs: No audible wheezing or coughing. Heart: Regular rate and rhythm. Abdomen: Soft, nontender, no obvious distention. Musculoskeletal: No edema, cyanosis, or clubbing. Neuro: Alert, answering all questions appropriately. Cranial nerves grossly intact. Skin: No rashes or petechiae noted. Psych: Normal affect.   LAB RESULTS:  Lab Results  Component Value Date   NA 130 (L) 07/16/2024   K 4.5 07/16/2024   CL 96 (L) 07/16/2024   CO2 23 07/16/2024   GLUCOSE 131 (H) 07/16/2024   BUN 14 07/16/2024   CREATININE 0.67 07/16/2024   CALCIUM  8.7 (L) 07/16/2024   PROT 7.2 07/16/2024   ALBUMIN 3.5 07/16/2024   AST 19 07/16/2024  ALT 13 07/16/2024   ALKPHOS 49 07/16/2024   BILITOT 0.6 07/16/2024   GFRNONAA >60 07/16/2024   GFRAA >60 09/12/2015    Lab Results  Component Value Date   WBC 7.7 07/16/2024   NEUTROABS 5.5 07/16/2024   HGB 10.2 (L) 07/16/2024   HCT 31.1 (L) 07/16/2024   MCV 96.6 07/16/2024   PLT 394 07/16/2024   Lab Results  Component Value Date   IRON  55 07/16/2024   TIBC 244 (L) 07/16/2024   IRONPCTSAT 23 07/16/2024   Lab Results  Component Value Date   FERRITIN 267 07/16/2024     STUDIES: No results found.  ASSESSMENT: Iron  deficiency anemia.  PLAN:    Iron  deficiency anemia: Patient's hemoglobin remains decreased, but stable at 10.2.  Iron  stores continue to be within normal limits.  Previously, all of his other laboratory work was also either negative or within normal limits.  He does not require additional IV Venofer  today.  Patient last received treatment on February 15, 2023.  After discussion with the patient and his daughter, it was agreed upon that no further follow-up is necessary.  Continue to monitor laboratory work 2-3 times a year and refer patient back if there are any questions or  concerns.  I spent a total of 20 minutes reviewing chart data, face-to-face evaluation with the patient, counseling and coordination of care as detailed above.    Patient expressed understanding and was in agreement with this plan. He also understands that He can call clinic at any time with any questions, concerns, or complaints.    Evalene JINNY Reusing, MD   07/17/2024 11:07 AM

## 2024-07-17 NOTE — Telephone Encounter (Signed)
 Copied from CRM #[id number]. Topic: Clinical - Lab/Test Results >> Jul 17, 2024  3:31 PM Rea C wrote: Reason for CRM: Patient returned call for Trish. Relayed note left and patient answered, see below. Also scheduled patient for sodium re-check on Thursday 09/18 @ 10:45.   Eating? Everyday  Acute change in symptoms? No  Recent increased fluid intake? Patient states he's probably not drinking as much as he should but he's not thirsty if that covers it.

## 2024-07-21 DIAGNOSIS — M1990 Unspecified osteoarthritis, unspecified site: Secondary | ICD-10-CM | POA: Diagnosis not present

## 2024-07-21 DIAGNOSIS — E1142 Type 2 diabetes mellitus with diabetic polyneuropathy: Secondary | ICD-10-CM | POA: Diagnosis not present

## 2024-07-21 DIAGNOSIS — I10 Essential (primary) hypertension: Secondary | ICD-10-CM | POA: Diagnosis not present

## 2024-07-21 DIAGNOSIS — E1165 Type 2 diabetes mellitus with hyperglycemia: Secondary | ICD-10-CM | POA: Diagnosis not present

## 2024-07-21 DIAGNOSIS — D509 Iron deficiency anemia, unspecified: Secondary | ICD-10-CM | POA: Diagnosis not present

## 2024-07-21 DIAGNOSIS — G7000 Myasthenia gravis without (acute) exacerbation: Secondary | ICD-10-CM | POA: Diagnosis not present

## 2024-07-23 ENCOUNTER — Other Ambulatory Visit (INDEPENDENT_AMBULATORY_CARE_PROVIDER_SITE_OTHER)

## 2024-07-23 ENCOUNTER — Ambulatory Visit (INDEPENDENT_AMBULATORY_CARE_PROVIDER_SITE_OTHER): Admitting: Podiatry

## 2024-07-23 ENCOUNTER — Other Ambulatory Visit

## 2024-07-23 ENCOUNTER — Encounter: Payer: Self-pay | Admitting: Podiatry

## 2024-07-23 DIAGNOSIS — E1142 Type 2 diabetes mellitus with diabetic polyneuropathy: Secondary | ICD-10-CM

## 2024-07-23 DIAGNOSIS — M79674 Pain in right toe(s): Secondary | ICD-10-CM | POA: Diagnosis not present

## 2024-07-23 DIAGNOSIS — M79675 Pain in left toe(s): Secondary | ICD-10-CM | POA: Diagnosis not present

## 2024-07-23 DIAGNOSIS — D509 Iron deficiency anemia, unspecified: Secondary | ICD-10-CM | POA: Diagnosis not present

## 2024-07-23 DIAGNOSIS — M1990 Unspecified osteoarthritis, unspecified site: Secondary | ICD-10-CM | POA: Diagnosis not present

## 2024-07-23 DIAGNOSIS — E871 Hypo-osmolality and hyponatremia: Secondary | ICD-10-CM

## 2024-07-23 DIAGNOSIS — I10 Essential (primary) hypertension: Secondary | ICD-10-CM | POA: Diagnosis not present

## 2024-07-23 DIAGNOSIS — B351 Tinea unguium: Secondary | ICD-10-CM | POA: Diagnosis not present

## 2024-07-23 DIAGNOSIS — G7000 Myasthenia gravis without (acute) exacerbation: Secondary | ICD-10-CM | POA: Diagnosis not present

## 2024-07-23 DIAGNOSIS — E1165 Type 2 diabetes mellitus with hyperglycemia: Secondary | ICD-10-CM | POA: Diagnosis not present

## 2024-07-23 LAB — SODIUM: Sodium: 131 meq/L — ABNORMAL LOW (ref 135–145)

## 2024-07-23 NOTE — Progress Notes (Signed)
  Subjective:  Patient ID: [name], male    DOB: 1925/11/09,  MRN: [id number]  [name] presents to clinic today for at risk foot care with history of diabetic neuropathy and painful thick toenails that are difficult to trim. Pain interferes with ambulation. Aggravating factors include wearing enclosed shoe gear. Pain is relieved with periodic professional debridement.  Chief Complaint  Patient presents with   [facility]    Rm3 Diabetic foot care/ Dr. Allena Hamilton last visit August 28,2025/ A1c 6.6 blood sugar 120   New problem(s): None.   PCP is Hamilton Allena, MD.  Allergies  Allergen Reactions   Magnesium  Other (See Comments)    Myasthenia gravis   Penicillamine Other (See Comments)    Myasthenia gravis    Review of Systems: Negative except as noted in the HPI.  Objective: No changes noted in today's physical examination. There were no vitals filed for this visit. [name] is a pleasant 89 y.o. male WD, WN in NAD. AAO x 3.  Vascular Examination: CFT <3 seconds b/l. DP/PT pulses faintly palpable b/l. Skin temperature gradient warm to warm b/l. No pain with calf compression. No ischemia or gangrene. No cyanosis or clubbing noted b/l. No edema noted b/l LE.   Neurological Examination: Pt has subjective symptoms of neuropathy. Protective sensation diminished with 10g monofilament b/l.  Dermatological Examination: No open wounds. No interdigital macerations.  Toenails 1-5 b/l thick, discolored, elongated with subungual debris and pain on dorsal palpation.    Pedal skin thin, shiny and atrophic b/l LE. No corns, calluses nor porokeratotic lesions noted.  Musculoskeletal Examination: Dropfoot b/l lower extremities. Has devices to assist with dropfoot bilaterally.  Radiographs: None  Assessment/Plan: 1. Pain due to onychomycosis of toenails of both feet   2. Diabetic polyneuropathy associated with type 2 diabetes mellitus (HCC)   Patient was  evaluated and treated. All patient's and/or POA's questions/concerns addressed on today's visit. Toenails 1-5 debrided in length and girth without incident. Continue foot and shoe inspections daily. Monitor blood glucose per PCP/Endocrinologist's recommendations. Continue soft, supportive shoe gear daily. Report any pedal injuries to medical professional. Call office if there are any questions/concerns. -Patient/POA to call should there be question/concern in the interim.   No follow-ups on file.  [name] Merlin, DPM      [facility] LOCATION: [address] [place] [address]                   Office [phone]   [facility] LOCATION: [address] [place], [place] [address] Office [phone]

## 2024-07-24 ENCOUNTER — Ambulatory Visit: Payer: Self-pay | Admitting: Internal Medicine

## 2024-07-24 DIAGNOSIS — E871 Hypo-osmolality and hyponatremia: Secondary | ICD-10-CM

## 2024-07-27 DIAGNOSIS — I10 Essential (primary) hypertension: Secondary | ICD-10-CM | POA: Diagnosis not present

## 2024-07-27 DIAGNOSIS — D509 Iron deficiency anemia, unspecified: Secondary | ICD-10-CM | POA: Diagnosis not present

## 2024-07-27 DIAGNOSIS — M1990 Unspecified osteoarthritis, unspecified site: Secondary | ICD-10-CM | POA: Diagnosis not present

## 2024-07-27 DIAGNOSIS — G7000 Myasthenia gravis without (acute) exacerbation: Secondary | ICD-10-CM | POA: Diagnosis not present

## 2024-07-27 DIAGNOSIS — E1165 Type 2 diabetes mellitus with hyperglycemia: Secondary | ICD-10-CM | POA: Diagnosis not present

## 2024-07-27 DIAGNOSIS — E1142 Type 2 diabetes mellitus with diabetic polyneuropathy: Secondary | ICD-10-CM | POA: Diagnosis not present

## 2024-07-29 DIAGNOSIS — D509 Iron deficiency anemia, unspecified: Secondary | ICD-10-CM | POA: Diagnosis not present

## 2024-07-29 DIAGNOSIS — M1990 Unspecified osteoarthritis, unspecified site: Secondary | ICD-10-CM | POA: Diagnosis not present

## 2024-07-29 DIAGNOSIS — E1165 Type 2 diabetes mellitus with hyperglycemia: Secondary | ICD-10-CM | POA: Diagnosis not present

## 2024-07-29 DIAGNOSIS — I10 Essential (primary) hypertension: Secondary | ICD-10-CM | POA: Diagnosis not present

## 2024-07-29 DIAGNOSIS — E1142 Type 2 diabetes mellitus with diabetic polyneuropathy: Secondary | ICD-10-CM | POA: Diagnosis not present

## 2024-07-29 DIAGNOSIS — G7000 Myasthenia gravis without (acute) exacerbation: Secondary | ICD-10-CM | POA: Diagnosis not present

## 2024-07-30 NOTE — Telephone Encounter (Signed)
 Called patient to triage. Call was disconnected.

## 2024-07-31 NOTE — Telephone Encounter (Signed)
 Just received message. Yes, please call and confirm he is doing ok. He apparently was having issues yesterday. If any persistent symptoms, he needs to be seen to confirm the reason for his symptoms and to confirm sodium ok.

## 2024-07-31 NOTE — Telephone Encounter (Signed)
 Spoke with pt and he stated that he is feeling great today. He stated that he has not had any more dry heaving since lunch times yesterday. Pt stated that he does not have any other symptoms, no weakness, no nausea. He is scheduled to come in next Thursday to have his sodium rechecked. Pt stated that he does not feel like he needs to be seen over the weekend. He would like to know if it is okay for him to get his flu shot when he comes in for his lab work on Thursday. Spoke with Dr. Glendia in regards to flu shot and she gave verbal okay for him to get when he comes in for his lab work.

## 2024-08-03 DIAGNOSIS — E1142 Type 2 diabetes mellitus with diabetic polyneuropathy: Secondary | ICD-10-CM | POA: Diagnosis not present

## 2024-08-03 DIAGNOSIS — D509 Iron deficiency anemia, unspecified: Secondary | ICD-10-CM | POA: Diagnosis not present

## 2024-08-03 DIAGNOSIS — G7000 Myasthenia gravis without (acute) exacerbation: Secondary | ICD-10-CM | POA: Diagnosis not present

## 2024-08-03 DIAGNOSIS — E1165 Type 2 diabetes mellitus with hyperglycemia: Secondary | ICD-10-CM | POA: Diagnosis not present

## 2024-08-03 DIAGNOSIS — I10 Essential (primary) hypertension: Secondary | ICD-10-CM | POA: Diagnosis not present

## 2024-08-03 DIAGNOSIS — M1990 Unspecified osteoarthritis, unspecified site: Secondary | ICD-10-CM | POA: Diagnosis not present

## 2024-08-05 DIAGNOSIS — E1142 Type 2 diabetes mellitus with diabetic polyneuropathy: Secondary | ICD-10-CM | POA: Diagnosis not present

## 2024-08-05 DIAGNOSIS — G7000 Myasthenia gravis without (acute) exacerbation: Secondary | ICD-10-CM | POA: Diagnosis not present

## 2024-08-05 DIAGNOSIS — E1165 Type 2 diabetes mellitus with hyperglycemia: Secondary | ICD-10-CM | POA: Diagnosis not present

## 2024-08-05 DIAGNOSIS — M1990 Unspecified osteoarthritis, unspecified site: Secondary | ICD-10-CM | POA: Diagnosis not present

## 2024-08-05 DIAGNOSIS — I10 Essential (primary) hypertension: Secondary | ICD-10-CM | POA: Diagnosis not present

## 2024-08-05 DIAGNOSIS — D509 Iron deficiency anemia, unspecified: Secondary | ICD-10-CM | POA: Diagnosis not present

## 2024-08-06 ENCOUNTER — Other Ambulatory Visit

## 2024-08-06 ENCOUNTER — Ambulatory Visit (INDEPENDENT_AMBULATORY_CARE_PROVIDER_SITE_OTHER)

## 2024-08-06 DIAGNOSIS — E871 Hypo-osmolality and hyponatremia: Secondary | ICD-10-CM | POA: Diagnosis not present

## 2024-08-06 DIAGNOSIS — Z23 Encounter for immunization: Secondary | ICD-10-CM

## 2024-08-07 LAB — SODIUM: Sodium: 135 meq/L (ref 135–145)

## 2024-08-08 ENCOUNTER — Ambulatory Visit: Payer: Self-pay | Admitting: Internal Medicine

## 2024-08-10 DIAGNOSIS — M1990 Unspecified osteoarthritis, unspecified site: Secondary | ICD-10-CM | POA: Diagnosis not present

## 2024-08-10 DIAGNOSIS — G7000 Myasthenia gravis without (acute) exacerbation: Secondary | ICD-10-CM | POA: Diagnosis not present

## 2024-08-10 DIAGNOSIS — E1165 Type 2 diabetes mellitus with hyperglycemia: Secondary | ICD-10-CM | POA: Diagnosis not present

## 2024-08-10 DIAGNOSIS — D509 Iron deficiency anemia, unspecified: Secondary | ICD-10-CM | POA: Diagnosis not present

## 2024-08-10 DIAGNOSIS — E1142 Type 2 diabetes mellitus with diabetic polyneuropathy: Secondary | ICD-10-CM | POA: Diagnosis not present

## 2024-08-10 DIAGNOSIS — I10 Essential (primary) hypertension: Secondary | ICD-10-CM | POA: Diagnosis not present

## 2024-08-14 ENCOUNTER — Telehealth: Payer: Self-pay

## 2024-08-14 DIAGNOSIS — Z96649 Presence of unspecified artificial hip joint: Secondary | ICD-10-CM | POA: Diagnosis not present

## 2024-08-14 DIAGNOSIS — Z8673 Personal history of transient ischemic attack (TIA), and cerebral infarction without residual deficits: Secondary | ICD-10-CM | POA: Diagnosis not present

## 2024-08-14 DIAGNOSIS — E1142 Type 2 diabetes mellitus with diabetic polyneuropathy: Secondary | ICD-10-CM | POA: Diagnosis not present

## 2024-08-14 DIAGNOSIS — N401 Enlarged prostate with lower urinary tract symptoms: Secondary | ICD-10-CM | POA: Diagnosis not present

## 2024-08-14 DIAGNOSIS — E1165 Type 2 diabetes mellitus with hyperglycemia: Secondary | ICD-10-CM | POA: Diagnosis not present

## 2024-08-14 DIAGNOSIS — Z7982 Long term (current) use of aspirin: Secondary | ICD-10-CM | POA: Diagnosis not present

## 2024-08-14 DIAGNOSIS — I10 Essential (primary) hypertension: Secondary | ICD-10-CM | POA: Diagnosis not present

## 2024-08-14 DIAGNOSIS — E7800 Pure hypercholesterolemia, unspecified: Secondary | ICD-10-CM | POA: Diagnosis not present

## 2024-08-14 DIAGNOSIS — D509 Iron deficiency anemia, unspecified: Secondary | ICD-10-CM | POA: Diagnosis not present

## 2024-08-14 DIAGNOSIS — M1990 Unspecified osteoarthritis, unspecified site: Secondary | ICD-10-CM | POA: Diagnosis not present

## 2024-08-14 DIAGNOSIS — Z85828 Personal history of other malignant neoplasm of skin: Secondary | ICD-10-CM | POA: Diagnosis not present

## 2024-08-14 DIAGNOSIS — K219 Gastro-esophageal reflux disease without esophagitis: Secondary | ICD-10-CM | POA: Diagnosis not present

## 2024-08-14 DIAGNOSIS — E871 Hypo-osmolality and hyponatremia: Secondary | ICD-10-CM | POA: Diagnosis not present

## 2024-08-14 DIAGNOSIS — Z7984 Long term (current) use of oral hypoglycemic drugs: Secondary | ICD-10-CM | POA: Diagnosis not present

## 2024-08-14 DIAGNOSIS — G7000 Myasthenia gravis without (acute) exacerbation: Secondary | ICD-10-CM | POA: Diagnosis not present

## 2024-08-14 DIAGNOSIS — N138 Other obstructive and reflux uropathy: Secondary | ICD-10-CM | POA: Diagnosis not present

## 2024-08-14 DIAGNOSIS — J309 Allergic rhinitis, unspecified: Secondary | ICD-10-CM | POA: Diagnosis not present

## 2024-08-14 NOTE — Telephone Encounter (Signed)
 Copied from CRM [id number]. Topic: General - Other >> Aug 14, 2024 10:04 AM Thersia BROCKS wrote: Reason for CRM: Sonya from [facility] called regarding some orders for patient that were faxed over on 09/24  stated she will refaxed them ,

## 2024-08-17 DIAGNOSIS — G7000 Myasthenia gravis without (acute) exacerbation: Secondary | ICD-10-CM | POA: Diagnosis not present

## 2024-08-17 DIAGNOSIS — E1165 Type 2 diabetes mellitus with hyperglycemia: Secondary | ICD-10-CM | POA: Diagnosis not present

## 2024-08-17 DIAGNOSIS — M1990 Unspecified osteoarthritis, unspecified site: Secondary | ICD-10-CM | POA: Diagnosis not present

## 2024-08-17 DIAGNOSIS — I10 Essential (primary) hypertension: Secondary | ICD-10-CM | POA: Diagnosis not present

## 2024-08-17 DIAGNOSIS — D509 Iron deficiency anemia, unspecified: Secondary | ICD-10-CM | POA: Diagnosis not present

## 2024-08-17 DIAGNOSIS — E1142 Type 2 diabetes mellitus with diabetic polyneuropathy: Secondary | ICD-10-CM | POA: Diagnosis not present

## 2024-08-17 NOTE — Telephone Encounter (Signed)
 Charge sheet attached and plan of care placed in form folder for your signature.

## 2024-08-17 NOTE — Telephone Encounter (Signed)
 Signed and placed in box.

## 2024-08-18 NOTE — Telephone Encounter (Signed)
 Faxed

## 2024-08-19 ENCOUNTER — Ambulatory Visit: Admitting: Urology

## 2024-08-19 DIAGNOSIS — N401 Enlarged prostate with lower urinary tract symptoms: Secondary | ICD-10-CM

## 2024-08-20 ENCOUNTER — Ambulatory Visit: Payer: Self-pay | Admitting: Urology

## 2024-08-20 ENCOUNTER — Ambulatory Visit (INDEPENDENT_AMBULATORY_CARE_PROVIDER_SITE_OTHER): Admitting: Physician Assistant

## 2024-08-20 VITALS — BP 107/65 | HR 70

## 2024-08-20 DIAGNOSIS — R3914 Feeling of incomplete bladder emptying: Secondary | ICD-10-CM

## 2024-08-20 DIAGNOSIS — N401 Enlarged prostate with lower urinary tract symptoms: Secondary | ICD-10-CM

## 2024-08-20 LAB — BLADDER SCAN AMB NON-IMAGING: Scan Result: 153 mL

## 2024-08-20 MED ORDER — TAMSULOSIN HCL 0.4 MG PO CAPS: ORAL_CAPSULE | ORAL | 3 refills | Status: AC

## 2024-08-20 MED ORDER — FINASTERIDE 5 MG PO TABS: ORAL_TABLET | ORAL | 3 refills | Status: AC

## 2024-08-20 NOTE — Progress Notes (Signed)
 08/20/2024 4:03 PM   [name] [birth year] [id number]  CC: Chief Complaint  Patient presents with   Follow-up   HPI: [name] is a 89 y.o. male with PMH BPH on Flomax  and finasteride  and UTI who presents today for annual follow-up.   Today he reports no significant urologic changes in the past year.  He continues to tolerate finasteride  and Flomax  well.  No hematuria, no UTIs.  He has increased urinary frequency after he takes Lasix , though understands this is normal.  PVR .  PMH: Past Medical History:  Diagnosis Date   Actinic keratosis 02/01/2021   midline anterior chin    Allergic rhinitis    Basal cell carcinoma 01/29/2007   Right mid forehead. Excised: 03/24/2007, margins free.   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 Excised 07/27/2014, residual focus AFX, margins free.   Squamous cell carcinoma of skin 04/01/2007   Right forehead, 2cm above mid brow. WD SCC wtih superficial infiltration. Excised: 05/14/2007, margins free   Squamous cell carcinoma of skin 05/16/2016   Crown. WD SCC, ulcerated. Excised: 07/03/2016, residual MD SCC, deep margin involved. Excised: 07/17/2016, margins free.   TIA (transient ischemic attack) 09/13/2015   Urinary outflow obstruction    mild    Surgical History: Past Surgical History:  Procedure Laterality Date   CATARACT EXTRACTION Bilateral    PARTIAL HIP ARTHROPLASTY  01/2019   PILONIDAL CYST EXCISION  1951   removal    ROTATOR CUFF REPAIR  1995   SKIN CANCER EXCISION     multiple   SQUAMOUS CELL CARCINOMA EXCISION     behind head   TONSILLECTOMY AND ADENOIDECTOMY  1938    Home Medications:  Allergies as of 08/20/2024       Reactions   Magnesium  Other (See Comments)   Myasthenia gravis   Penicillamine Other (See Comments)   Myasthenia gravis        Medication List        Accurate as of August 20, 2024  4:03 PM. If you have any questions, ask your nurse or doctor.          Accu-Chek FastClix Lancets Misc USE TO TEST BLOOD SUGAR ONCE DAILY   Accu-Chek Guide Test test strip Generic drug: glucose blood Use to check blood sugars once daily. Dx E11.9   amLODipine  10 MG tablet Commonly known as: NORVASC  Take 1 tablet (10 mg total) by mouth daily.   aspirin  EC 81 MG tablet Take 81 mg by mouth daily.   calcium -vitamin D  500-200 MG-UNIT tablet Commonly known as: OSCAL WITH D Take 1 tablet by mouth 2 (two) times daily.   finasteride  5 MG tablet Commonly known as: PROSCAR  Take 1 tablet by mouth once daily   folic acid  1 MG tablet Commonly known as: FOLVITE  TAKE 1 TABLET BY MOUTH DAILY   furosemide  40 MG tablet Commonly known as: LASIX  TAKE 1 TABLET BY MOUTH DAILY   lovastatin  20 MG  tablet Commonly known as: MEVACOR  Take 1 tablet (20 mg total) by mouth at bedtime.   metFORMIN  500 MG 24 hr tablet Commonly known as: GLUCOPHAGE -XR TAKE 1 TABLET BY MOUTH DAILY WITH BREAKFAST   mycophenolate  500 MG tablet Commonly known as: CELLCEPT  Take 1,000 mg by mouth 2 (two) times daily.   olmesartan  40 MG tablet Commonly known as: BENICAR  TAKE 1 TABLET BY MOUTH DAILY   pantoprazole  20 MG tablet Commonly known as: PROTONIX  Take 1 tablet (20 mg total) by mouth daily.   potassium chloride  SA 20 MEQ tablet Commonly known as: KLOR-CON  M TAKE 1 TABLET BY MOUTH DAILY   sodium chloride  1 g tablet TAKE 1 TABLET BY MOUTH DAILY   tamsulosin  0.4 MG Caps capsule Commonly known as:  FLOMAX  TAKE 1 CAPSULE BY MOUTH ONCE DAILY AFTER  SUPPER        Allergies:  Allergies  Allergen Reactions   Magnesium  Other (See Comments)    Myasthenia gravis   Penicillamine Other (See Comments)    Myasthenia gravis    Family History: Family History  Problem Relation Age of Onset   Heart disease Father        heart atack   Alzheimer's disease Mother    CVA Brother     Social History:   reports that he has never smoked. He has never been exposed to tobacco smoke. He has never used smokeless tobacco. He reports that he does not drink alcohol and does not use drugs.  Physical Exam: There were no vitals taken for this visit.  Constitutional:  Alert and oriented, no acute distress, nontoxic appearing HEENT: , AT Cardiovascular: No clubbing, cyanosis, or edema Respiratory: Normal respiratory effort, no increased work of breathing Skin: No rashes, bruises or suspicious lesions Neurologic: Grossly intact, no focal deficits, moving all 4 extremities Psychiatric: Normal mood and affect  Laboratory Data: Results for orders placed or performed in visit on 08/20/24  BLADDER SCAN AMB NON-IMAGING   Collection Time: 08/20/24  4:13 PM  Result Value Ref Range   Scan Result 153 ml   *Note: Due to a large number of results and/or encounters for the requested time period, some results have not been displayed. A complete set of results can be found in Results Review.   Assessment & Plan:   1. Benign prostatic hyperplasia with incomplete bladder emptying (Primary) Symptoms well-controlled on Flomax  and finasteride .  PVR is borderline today, will continue to monitor.  No acute concerns.  Will see him back in a year. - finasteride  (PROSCAR ) 5 MG tablet; Take 1 tablet by mouth once daily  Dispense: 90 tablet; Refill: 3 - tamsulosin  (FLOMAX ) 0.4 MG CAPS capsule; TAKE 1 CAPSULE BY MOUTH ONCE DAILY AFTER  SUPPER  Dispense: 90 capsule; Refill: 3 - BLADDER SCAN AMB NON-IMAGING   Return in  about 1 year (around 08/20/2025) for Annual PVR.  Lucie Hones, PA-C  [facility] [place] [address], [address] [place], [place] [address] [phone]

## 2024-08-22 ENCOUNTER — Other Ambulatory Visit: Payer: Self-pay | Admitting: Internal Medicine

## 2024-08-22 DIAGNOSIS — I10 Essential (primary) hypertension: Secondary | ICD-10-CM

## 2024-08-24 DIAGNOSIS — D509 Iron deficiency anemia, unspecified: Secondary | ICD-10-CM | POA: Diagnosis not present

## 2024-08-24 DIAGNOSIS — E1142 Type 2 diabetes mellitus with diabetic polyneuropathy: Secondary | ICD-10-CM | POA: Diagnosis not present

## 2024-08-24 DIAGNOSIS — I10 Essential (primary) hypertension: Secondary | ICD-10-CM | POA: Diagnosis not present

## 2024-08-24 DIAGNOSIS — G7000 Myasthenia gravis without (acute) exacerbation: Secondary | ICD-10-CM | POA: Diagnosis not present

## 2024-08-24 DIAGNOSIS — M1990 Unspecified osteoarthritis, unspecified site: Secondary | ICD-10-CM | POA: Diagnosis not present

## 2024-08-24 DIAGNOSIS — E1165 Type 2 diabetes mellitus with hyperglycemia: Secondary | ICD-10-CM | POA: Diagnosis not present

## 2024-08-31 ENCOUNTER — Telehealth: Payer: Self-pay | Admitting: Podiatry

## 2024-08-31 DIAGNOSIS — I10 Essential (primary) hypertension: Secondary | ICD-10-CM | POA: Diagnosis not present

## 2024-08-31 DIAGNOSIS — G7000 Myasthenia gravis without (acute) exacerbation: Secondary | ICD-10-CM | POA: Diagnosis not present

## 2024-08-31 DIAGNOSIS — E1165 Type 2 diabetes mellitus with hyperglycemia: Secondary | ICD-10-CM | POA: Diagnosis not present

## 2024-08-31 DIAGNOSIS — D509 Iron deficiency anemia, unspecified: Secondary | ICD-10-CM | POA: Diagnosis not present

## 2024-08-31 DIAGNOSIS — E1142 Type 2 diabetes mellitus with diabetic polyneuropathy: Secondary | ICD-10-CM | POA: Diagnosis not present

## 2024-08-31 DIAGNOSIS — M1990 Unspecified osteoarthritis, unspecified site: Secondary | ICD-10-CM | POA: Diagnosis not present

## 2024-08-31 NOTE — Telephone Encounter (Signed)
 Patient would like a referral for diabetic shoes.

## 2024-09-03 ENCOUNTER — Encounter: Payer: Self-pay | Admitting: Internal Medicine

## 2024-09-04 NOTE — Telephone Encounter (Signed)
 Form has been dropped off and placed in back mail box.

## 2024-09-04 NOTE — Telephone Encounter (Signed)
 Signed and placed in box.

## 2024-09-04 NOTE — Telephone Encounter (Signed)
 Spoke with pt. Notified pt first section was complete and to see if pod. Could fill out second part. Pt stated he wold reach out to them and let us  know. Form placed up front for someone in pt family to pick up

## 2024-09-07 ENCOUNTER — Telehealth: Payer: Self-pay | Admitting: Internal Medicine

## 2024-09-07 NOTE — Telephone Encounter (Signed)
 Pt's family member came in to pick up today.    Encounter Date: 09/03/2024  Signed    Spoke with pt. Notified pt first section was complete and to see if pod. Could fill out second part. Pt stated he wold reach out to them and let us  know. Form placed up front for someone in pt family to pick up

## 2024-09-09 ENCOUNTER — Telehealth: Payer: Self-pay

## 2024-09-09 DIAGNOSIS — D509 Iron deficiency anemia, unspecified: Secondary | ICD-10-CM | POA: Diagnosis not present

## 2024-09-09 DIAGNOSIS — G7000 Myasthenia gravis without (acute) exacerbation: Secondary | ICD-10-CM | POA: Diagnosis not present

## 2024-09-09 DIAGNOSIS — I10 Essential (primary) hypertension: Secondary | ICD-10-CM | POA: Diagnosis not present

## 2024-09-09 DIAGNOSIS — M1990 Unspecified osteoarthritis, unspecified site: Secondary | ICD-10-CM | POA: Diagnosis not present

## 2024-09-09 DIAGNOSIS — E1165 Type 2 diabetes mellitus with hyperglycemia: Secondary | ICD-10-CM | POA: Diagnosis not present

## 2024-09-09 DIAGNOSIS — E1142 Type 2 diabetes mellitus with diabetic polyneuropathy: Secondary | ICD-10-CM | POA: Diagnosis not present

## 2024-09-09 NOTE — Telephone Encounter (Unsigned)
 Copied from CRM [id number]. Topic: Clinical - Home Health Verbal Orders >> Sep 09, 2024  4:17 PM Nessti S wrote: Caller/Agency: harlene Rushing Number: [phone] and can leave message Service Requested: Physical Therapy Frequency: 1x a week for 9 weeks Any new concerns about the patient? No

## 2024-09-10 NOTE — Telephone Encounter (Signed)
 Ok

## 2024-09-11 NOTE — Telephone Encounter (Signed)
 Left detailed message for [name] to provider verbal OK per Dr Glendia for patient. Advised to give our office a call back if she had any questions or concerns.   OK for E2C2 to send call through to clinical staff if [name] calls back.

## 2024-09-13 DIAGNOSIS — I10 Essential (primary) hypertension: Secondary | ICD-10-CM | POA: Diagnosis not present

## 2024-09-13 DIAGNOSIS — Z96649 Presence of unspecified artificial hip joint: Secondary | ICD-10-CM | POA: Diagnosis not present

## 2024-09-13 DIAGNOSIS — E1165 Type 2 diabetes mellitus with hyperglycemia: Secondary | ICD-10-CM | POA: Diagnosis not present

## 2024-09-13 DIAGNOSIS — E1142 Type 2 diabetes mellitus with diabetic polyneuropathy: Secondary | ICD-10-CM | POA: Diagnosis not present

## 2024-09-13 DIAGNOSIS — N138 Other obstructive and reflux uropathy: Secondary | ICD-10-CM | POA: Diagnosis not present

## 2024-09-13 DIAGNOSIS — Z7984 Long term (current) use of oral hypoglycemic drugs: Secondary | ICD-10-CM | POA: Diagnosis not present

## 2024-09-13 DIAGNOSIS — Z7982 Long term (current) use of aspirin: Secondary | ICD-10-CM | POA: Diagnosis not present

## 2024-09-13 DIAGNOSIS — M1990 Unspecified osteoarthritis, unspecified site: Secondary | ICD-10-CM | POA: Diagnosis not present

## 2024-09-13 DIAGNOSIS — D509 Iron deficiency anemia, unspecified: Secondary | ICD-10-CM | POA: Diagnosis not present

## 2024-09-13 DIAGNOSIS — E7800 Pure hypercholesterolemia, unspecified: Secondary | ICD-10-CM | POA: Diagnosis not present

## 2024-09-13 DIAGNOSIS — Z85828 Personal history of other malignant neoplasm of skin: Secondary | ICD-10-CM | POA: Diagnosis not present

## 2024-09-13 DIAGNOSIS — G7000 Myasthenia gravis without (acute) exacerbation: Secondary | ICD-10-CM | POA: Diagnosis not present

## 2024-09-13 DIAGNOSIS — E871 Hypo-osmolality and hyponatremia: Secondary | ICD-10-CM | POA: Diagnosis not present

## 2024-09-13 DIAGNOSIS — N401 Enlarged prostate with lower urinary tract symptoms: Secondary | ICD-10-CM | POA: Diagnosis not present

## 2024-09-13 DIAGNOSIS — J309 Allergic rhinitis, unspecified: Secondary | ICD-10-CM | POA: Diagnosis not present

## 2024-09-13 DIAGNOSIS — Z8673 Personal history of transient ischemic attack (TIA), and cerebral infarction without residual deficits: Secondary | ICD-10-CM | POA: Diagnosis not present

## 2024-09-13 DIAGNOSIS — K219 Gastro-esophageal reflux disease without esophagitis: Secondary | ICD-10-CM | POA: Diagnosis not present

## 2024-09-16 DIAGNOSIS — G7000 Myasthenia gravis without (acute) exacerbation: Secondary | ICD-10-CM | POA: Diagnosis not present

## 2024-09-16 DIAGNOSIS — E1165 Type 2 diabetes mellitus with hyperglycemia: Secondary | ICD-10-CM | POA: Diagnosis not present

## 2024-09-16 DIAGNOSIS — E1142 Type 2 diabetes mellitus with diabetic polyneuropathy: Secondary | ICD-10-CM | POA: Diagnosis not present

## 2024-09-16 DIAGNOSIS — M1990 Unspecified osteoarthritis, unspecified site: Secondary | ICD-10-CM | POA: Diagnosis not present

## 2024-09-16 DIAGNOSIS — D509 Iron deficiency anemia, unspecified: Secondary | ICD-10-CM | POA: Diagnosis not present

## 2024-09-16 DIAGNOSIS — I10 Essential (primary) hypertension: Secondary | ICD-10-CM | POA: Diagnosis not present

## 2024-09-17 ENCOUNTER — Other Ambulatory Visit (INDEPENDENT_AMBULATORY_CARE_PROVIDER_SITE_OTHER): Payer: Self-pay | Admitting: Podiatry

## 2024-09-17 DIAGNOSIS — G7001 Myasthenia gravis with (acute) exacerbation: Secondary | ICD-10-CM | POA: Diagnosis not present

## 2024-09-17 DIAGNOSIS — E1142 Type 2 diabetes mellitus with diabetic polyneuropathy: Secondary | ICD-10-CM

## 2024-09-17 DIAGNOSIS — E119 Type 2 diabetes mellitus without complications: Secondary | ICD-10-CM | POA: Diagnosis not present

## 2024-09-17 DIAGNOSIS — H35373 Puckering of macula, bilateral: Secondary | ICD-10-CM | POA: Diagnosis not present

## 2024-09-17 DIAGNOSIS — M2012 Hallux valgus (acquired), left foot: Secondary | ICD-10-CM

## 2024-09-17 DIAGNOSIS — H02054 Trichiasis without entropian left upper eyelid: Secondary | ICD-10-CM | POA: Diagnosis not present

## 2024-09-17 DIAGNOSIS — M2011 Hallux valgus (acquired), right foot: Secondary | ICD-10-CM

## 2024-09-17 LAB — OPHTHALMOLOGY REPORT-SCANNED

## 2024-09-17 NOTE — Progress Notes (Signed)
 1. Diabetic polyneuropathy associated with type 2 diabetes mellitus (HCC)   2. Hallux valgus, acquired, bilateral    Orders Placed This Encounter  Procedures   For home use only DME Other see comment    To [facility]:  Dispense one pair extra depth shoes and 3 pair heat moldable insoles.    Length of Need:   12 Months

## 2024-09-21 DIAGNOSIS — I10 Essential (primary) hypertension: Secondary | ICD-10-CM | POA: Diagnosis not present

## 2024-09-21 DIAGNOSIS — M1990 Unspecified osteoarthritis, unspecified site: Secondary | ICD-10-CM | POA: Diagnosis not present

## 2024-09-21 DIAGNOSIS — E1165 Type 2 diabetes mellitus with hyperglycemia: Secondary | ICD-10-CM | POA: Diagnosis not present

## 2024-09-21 DIAGNOSIS — E1142 Type 2 diabetes mellitus with diabetic polyneuropathy: Secondary | ICD-10-CM | POA: Diagnosis not present

## 2024-09-21 DIAGNOSIS — G7000 Myasthenia gravis without (acute) exacerbation: Secondary | ICD-10-CM | POA: Diagnosis not present

## 2024-09-21 DIAGNOSIS — D509 Iron deficiency anemia, unspecified: Secondary | ICD-10-CM | POA: Diagnosis not present

## 2024-09-24 ENCOUNTER — Other Ambulatory Visit: Payer: Self-pay | Admitting: Internal Medicine

## 2024-09-25 DIAGNOSIS — E1165 Type 2 diabetes mellitus with hyperglycemia: Secondary | ICD-10-CM | POA: Diagnosis not present

## 2024-09-25 DIAGNOSIS — E871 Hypo-osmolality and hyponatremia: Secondary | ICD-10-CM | POA: Diagnosis not present

## 2024-09-25 DIAGNOSIS — I10 Essential (primary) hypertension: Secondary | ICD-10-CM | POA: Diagnosis not present

## 2024-09-25 DIAGNOSIS — J309 Allergic rhinitis, unspecified: Secondary | ICD-10-CM | POA: Diagnosis not present

## 2024-09-25 DIAGNOSIS — N401 Enlarged prostate with lower urinary tract symptoms: Secondary | ICD-10-CM | POA: Diagnosis not present

## 2024-09-25 DIAGNOSIS — E7800 Pure hypercholesterolemia, unspecified: Secondary | ICD-10-CM | POA: Diagnosis not present

## 2024-09-25 DIAGNOSIS — G7000 Myasthenia gravis without (acute) exacerbation: Secondary | ICD-10-CM | POA: Diagnosis not present

## 2024-09-25 DIAGNOSIS — M1990 Unspecified osteoarthritis, unspecified site: Secondary | ICD-10-CM | POA: Diagnosis not present

## 2024-09-25 DIAGNOSIS — K219 Gastro-esophageal reflux disease without esophagitis: Secondary | ICD-10-CM | POA: Diagnosis not present

## 2024-09-25 DIAGNOSIS — N138 Other obstructive and reflux uropathy: Secondary | ICD-10-CM | POA: Diagnosis not present

## 2024-09-25 DIAGNOSIS — E1142 Type 2 diabetes mellitus with diabetic polyneuropathy: Secondary | ICD-10-CM | POA: Diagnosis not present

## 2024-09-25 DIAGNOSIS — D509 Iron deficiency anemia, unspecified: Secondary | ICD-10-CM | POA: Diagnosis not present

## 2024-09-28 DIAGNOSIS — D509 Iron deficiency anemia, unspecified: Secondary | ICD-10-CM | POA: Diagnosis not present

## 2024-09-28 DIAGNOSIS — E1165 Type 2 diabetes mellitus with hyperglycemia: Secondary | ICD-10-CM | POA: Diagnosis not present

## 2024-09-28 DIAGNOSIS — E1142 Type 2 diabetes mellitus with diabetic polyneuropathy: Secondary | ICD-10-CM | POA: Diagnosis not present

## 2024-09-28 DIAGNOSIS — I10 Essential (primary) hypertension: Secondary | ICD-10-CM | POA: Diagnosis not present

## 2024-09-28 DIAGNOSIS — M1990 Unspecified osteoarthritis, unspecified site: Secondary | ICD-10-CM | POA: Diagnosis not present

## 2024-09-28 DIAGNOSIS — G7000 Myasthenia gravis without (acute) exacerbation: Secondary | ICD-10-CM | POA: Diagnosis not present

## 2024-10-07 DIAGNOSIS — E1142 Type 2 diabetes mellitus with diabetic polyneuropathy: Secondary | ICD-10-CM | POA: Diagnosis not present

## 2024-10-07 DIAGNOSIS — G7000 Myasthenia gravis without (acute) exacerbation: Secondary | ICD-10-CM | POA: Diagnosis not present

## 2024-10-07 DIAGNOSIS — D509 Iron deficiency anemia, unspecified: Secondary | ICD-10-CM | POA: Diagnosis not present

## 2024-10-07 DIAGNOSIS — E1165 Type 2 diabetes mellitus with hyperglycemia: Secondary | ICD-10-CM | POA: Diagnosis not present

## 2024-10-07 DIAGNOSIS — I10 Essential (primary) hypertension: Secondary | ICD-10-CM | POA: Diagnosis not present

## 2024-10-07 DIAGNOSIS — M1990 Unspecified osteoarthritis, unspecified site: Secondary | ICD-10-CM | POA: Diagnosis not present

## 2024-10-12 DIAGNOSIS — M1990 Unspecified osteoarthritis, unspecified site: Secondary | ICD-10-CM | POA: Diagnosis not present

## 2024-10-12 DIAGNOSIS — E1165 Type 2 diabetes mellitus with hyperglycemia: Secondary | ICD-10-CM | POA: Diagnosis not present

## 2024-10-12 DIAGNOSIS — G7000 Myasthenia gravis without (acute) exacerbation: Secondary | ICD-10-CM | POA: Diagnosis not present

## 2024-10-12 DIAGNOSIS — E1142 Type 2 diabetes mellitus with diabetic polyneuropathy: Secondary | ICD-10-CM | POA: Diagnosis not present

## 2024-10-12 DIAGNOSIS — D509 Iron deficiency anemia, unspecified: Secondary | ICD-10-CM | POA: Diagnosis not present

## 2024-10-12 DIAGNOSIS — I10 Essential (primary) hypertension: Secondary | ICD-10-CM | POA: Diagnosis not present

## 2024-10-15 ENCOUNTER — Ambulatory Visit: Admitting: Dermatology

## 2024-10-15 ENCOUNTER — Encounter: Payer: Self-pay | Admitting: Dermatology

## 2024-10-15 DIAGNOSIS — L0291 Cutaneous abscess, unspecified: Secondary | ICD-10-CM

## 2024-10-15 DIAGNOSIS — L723 Sebaceous cyst: Secondary | ICD-10-CM | POA: Diagnosis not present

## 2024-10-15 DIAGNOSIS — L720 Epidermal cyst: Secondary | ICD-10-CM

## 2024-10-15 NOTE — Progress Notes (Signed)
° °  Follow-Up Visit   Subjective  [name] is a 89 y.o. male who presents for the following: patient complaining of cyst behind his neck that ruptured last night after his shower. No pain   The following portions of the chart were reviewed this encounter and updated as appropriate: medications, allergies, medical history  Review of Systems:  No other skin or systemic complaints except as noted in HPI or Assessment and Plan.  Objective  Well appearing patient in no apparent distress; mood and affect are within normal limits.  A focused examination was performed of the following areas: Neck   Relevant exam findings are noted in the Assessment and Plan.  Right Posterior Neck Subcutaneous papule/nodule with erythema, drainage and edema, tender to touch.  Assessment & Plan     INFLAMED EPIDERMOID CYST OF SKIN Right Posterior Neck - Incision and Drainage - Right Posterior Neck Location: right posterior lower neck  Informed Consent: Discussed risks (permanent scarring, light or dark discoloration, infection, pain, bleeding, bruising, redness, damage to adjacent structures, and recurrence of the lesion) and benefits of the procedure, as well as the alternatives.  Informed consent was obtained.  Preparation: The area was prepped with alcohol.  Anesthesia: Lidocaine  1% with epinephrine  Procedure Details: A 4 mm punch was used to open the lesion. The lesion drained pus and blood. Extracted cyst contents with toothed forceps. Performed blunt dissection to remove cyst contents with forceps A large amount of fluid was drained.    Antibiotic ointment and a sterile pressure dressing were applied. The patient tolerated procedure well.  Total number of lesions drained: 1  Plan: The patient was instructed on post-op care. Vaseline and bandage daily. Allow for continued drainage. Recommend OTC analgesia as needed for pain.   - Aerobic culture - Right Posterior  Neck ABSCESS   This Visit - Incision and Drainage Location: right posterior lower neck  Informed Consent: Discussed risks (permanent scarring, light or dark discoloration, infection, pain, bleeding, bruising, redness, damage to adjacent structures, and recurrence of the lesion) and benefits of the procedure, as well as the alternatives.  Informed consent was obtained.  Preparation: The area was prepped with alcohol.  Anesthesia: Lidocaine  1% with epinephrine  Procedure Details: A 4 mm punch was used to open the lesion. The lesion drained pus and blood. Extracted cyst contents with toothed forceps. Performed blunt dissection to remove cyst contents with forceps A large amount of fluid was drained.    Antibiotic ointment and a sterile pressure dressing were applied. The patient tolerated procedure well.  Total number of lesions drained: 1  Plan: The patient was instructed on post-op care. Vaseline and bandage daily. Allow for continued drainage. Recommend OTC analgesia as needed for pain.  - Aerobic culture  Return for TBSE As Scheduled, With Dr. Hester.  I, Jill Parcell, CMA, am acting as scribe for Boneta Sharps, MD.   Documentation: I have reviewed the above documentation for accuracy and completeness, and I agree with the above.  Boneta Sharps, MD

## 2024-10-15 NOTE — Patient Instructions (Addendum)

## 2024-10-18 ENCOUNTER — Ambulatory Visit: Payer: Self-pay | Admitting: Dermatology

## 2024-10-18 LAB — AEROBIC CULTURE

## 2024-10-22 ENCOUNTER — Encounter: Payer: Self-pay | Admitting: Podiatry

## 2024-10-22 ENCOUNTER — Ambulatory Visit (INDEPENDENT_AMBULATORY_CARE_PROVIDER_SITE_OTHER): Admitting: Podiatry

## 2024-10-22 DIAGNOSIS — M21371 Foot drop, right foot: Secondary | ICD-10-CM

## 2024-10-22 DIAGNOSIS — M2011 Hallux valgus (acquired), right foot: Secondary | ICD-10-CM | POA: Diagnosis not present

## 2024-10-22 DIAGNOSIS — M79675 Pain in left toe(s): Secondary | ICD-10-CM | POA: Diagnosis not present

## 2024-10-22 DIAGNOSIS — M79674 Pain in right toe(s): Secondary | ICD-10-CM | POA: Diagnosis not present

## 2024-10-22 DIAGNOSIS — M21372 Foot drop, left foot: Secondary | ICD-10-CM | POA: Diagnosis not present

## 2024-10-22 DIAGNOSIS — B351 Tinea unguium: Secondary | ICD-10-CM | POA: Diagnosis not present

## 2024-10-22 DIAGNOSIS — M2012 Hallux valgus (acquired), left foot: Secondary | ICD-10-CM

## 2024-10-22 DIAGNOSIS — E1142 Type 2 diabetes mellitus with diabetic polyneuropathy: Secondary | ICD-10-CM

## 2024-10-22 DIAGNOSIS — L84 Corns and callosities: Secondary | ICD-10-CM | POA: Diagnosis not present

## 2024-10-22 NOTE — Progress Notes (Signed)
 Subjective:  Patient ID: [name], male    DOB: 09/05/1926,  MRN: [id number]  [name] presents to clinic today for for diabetic foot evaluation and callus(es) left foot and painful mycotic toenails that are difficult to trim. Painful toenails interfere with ambulation. Aggravating factors include wearing enclosed shoe gear. Pain is relieved with periodic professional debridement. Painful calluses are aggravated when weightbearing with and without shoegear. Pain is relieved with periodic professional debridement. Patient is requesting completion of paperwork for [facility] for his diabetic shoes. Dr. Glendia has completed her portion of the form and Mr. Ybarra needs me to perform his foot examination and complete my section of the form. Chief Complaint  Patient presents with   Nail Problem    Thick painful toenails, 3 month follow up    Diabetes    A1C  6.6   Peripheral Neuropathy   New problem(s): None.   PCP is Justin Shad, MD. [name] 09/09/2024.  Allergies[1]  Review of Systems: Negative except as noted in the HPI.  Objective: No changes noted in today's physical examination. There were no vitals filed for this visit. [name] is a pleasant 89 y.o. male in NAD. AAO x 3.   Diabetic foot exam was performed with the following findings:   Vascular Examination: CFT <3 seconds b/l. DP/PT pulses faintly palpable b/l. Pedal edema trace b/l. Skin temperature gradient warm to warm b/l. Digital hair absent. No pain with calf compression. No ischemia or gangrene. No cyanosis or clubbing noted b/l.    Neurological Examination: Protective sensation decreased with 10 gram monofilament b/l. Vibratory sensation diminished b/l.  Dermatological Examination: Pedal skin warm and supple b/l.   No open wounds. No interdigital macerations.  Toenails 1-5 b/l thick, discolored, elongated with subungual debris and pain on dorsal  palpation.    No hyperkeratotic nor porokeratotic lesions.  Musculoskeletal Examination: Normal muscle strength 5/5 to all lower extremity muscle groups bilaterally. No pain, crepitus or joint limitation noted with ROM b/l LE. Dropfoot b/l lower extremities. HAV with bunion deformity noted b/l LE.[name] Patient ambulates with walker assistance.  Radiographs: None    Assessment/Plan: 1. Pain due to onychomycosis of toenails of both feet   2. Callus of foot   3. Hallux valgus, acquired, bilateral   4. Foot drop, bilateral   5. Diabetic polyneuropathy associated with type 2 diabetes mellitus (HCC)   Diabetic foot examination performed today. All patient's and/or POA's questions/concerns addressed on today's visit. Toenails 1-5 b/l debrided in length and girth without incident. Continue foot and shoe inspections daily. Monitor blood glucose per PCP/Endocrinologist's recommendations. Continue soft, supportive shoe gear daily. Report any pedal injuries to medical professional. Call office if there are any questions/concerns. -Rx to be sent to [facility] for one pair diabetic shoes and 3 pair heat moldable insoles. To [facility] [address] [place], [place] [address] Phone: [phone] Fax: [phone]. -Patient/POA to call should there be question/concern in the interim.   Return in about 3 months (around 01/20/2025).  [name], DPM      [facility] LOCATION: [address], [place] [address]  Office [phone]   [facility] LOCATION: [address] [place], [place] [address] Office [phone]     [1]  Allergies Allergen Reactions   Magnesium  Other (See Comments)    Myasthenia gravis   Penicillamine Other (See Comments)    Myasthenia gravis

## 2024-11-02 ENCOUNTER — Other Ambulatory Visit: Payer: Self-pay | Admitting: Urology

## 2024-11-02 DIAGNOSIS — N401 Enlarged prostate with lower urinary tract symptoms: Secondary | ICD-10-CM

## 2024-11-05 ENCOUNTER — Encounter: Payer: Self-pay | Admitting: Oncology

## 2024-11-12 ENCOUNTER — Ambulatory Visit (INDEPENDENT_AMBULATORY_CARE_PROVIDER_SITE_OTHER): Admitting: Internal Medicine

## 2024-11-12 ENCOUNTER — Encounter: Payer: Self-pay | Admitting: Internal Medicine

## 2024-11-12 VITALS — BP 112/56 | HR 68 | Temp 98.1°F | Ht 71.0 in | Wt 196.0 lb

## 2024-11-12 DIAGNOSIS — E1165 Type 2 diabetes mellitus with hyperglycemia: Secondary | ICD-10-CM

## 2024-11-12 DIAGNOSIS — Z7984 Long term (current) use of oral hypoglycemic drugs: Secondary | ICD-10-CM

## 2024-11-12 DIAGNOSIS — E1142 Type 2 diabetes mellitus with diabetic polyneuropathy: Secondary | ICD-10-CM | POA: Diagnosis not present

## 2024-11-12 DIAGNOSIS — E7800 Pure hypercholesterolemia, unspecified: Secondary | ICD-10-CM | POA: Diagnosis not present

## 2024-11-12 DIAGNOSIS — M21379 Foot drop, unspecified foot: Secondary | ICD-10-CM

## 2024-11-12 DIAGNOSIS — E871 Hypo-osmolality and hyponatremia: Secondary | ICD-10-CM

## 2024-11-12 DIAGNOSIS — I10 Essential (primary) hypertension: Secondary | ICD-10-CM

## 2024-11-12 DIAGNOSIS — G7000 Myasthenia gravis without (acute) exacerbation: Secondary | ICD-10-CM

## 2024-11-12 DIAGNOSIS — D509 Iron deficiency anemia, unspecified: Secondary | ICD-10-CM

## 2024-11-12 MED ORDER — FUROSEMIDE 40 MG PO TABS: 40.0000 mg | ORAL_TABLET | Freq: Every day | ORAL | 1 refills | Status: AC

## 2024-11-12 MED ORDER — POTASSIUM CHLORIDE CRYS ER 20 MEQ PO TBCR: 20.0000 meq | EXTENDED_RELEASE_TABLET | Freq: Every day | ORAL | 1 refills | Status: AC

## 2024-11-12 MED ORDER — FOLIC ACID 1 MG PO TABS: 1.0000 mg | ORAL_TABLET | Freq: Every day | ORAL | 1 refills | Status: AC

## 2024-11-12 MED ORDER — SODIUM CHLORIDE 1 G PO TABS: 1.0000 g | ORAL_TABLET | Freq: Every day | ORAL | 1 refills | Status: AC

## 2024-11-12 NOTE — Progress Notes (Unsigned)
 "  Subjective:    Patient ID: [name], male    DOB: [birth year], 89 y.o.   MRN: [id number]  Patient here for  Chief Complaint  Patient presents with   Medical Management of Chronic Issues    HPI Here for a scheduled follow up -  follow up regarding hypertension, hyponatremia and diabetes. He is accompanied by his daughter.   History obtained from both of them. EMG - [facility] - There is electrodiagnostic evidence of a right greater than left lumbosacral plexopahty. There also is electrodiagnostic evidence of a background sensorimotor polyneuropathy.  Had f/u with urology 08/22/23 - continues on flomax  and finasteride . Had f/u with Dr Jacobo 01/09/24- IDA - hgb stable 10.8. stable. F/u 6 months. Saw neurology 04/21/24 f/u nyasthenia - stable. Continue cellcept . Reports he is doing relatively well. Completed 8 weeks of PT. Feels walking more upright. Just completed. Breathing stable. No chest pain. No abdominal pain or bowel change. Am sugars averaging 100-135. Some pain - nerve pain. Discussed treatment.    Past Medical History:  Diagnosis Date   Actinic keratosis 02/01/2021   midline anterior chin    Allergic rhinitis    Basal cell carcinoma 01/29/2007   Right mid forehead. Excised: 03/24/2007, margins free.   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 Excised 07/27/2014, residual focus AFX, margins free.   Squamous cell carcinoma of skin 04/01/2007   Right forehead, 2cm above mid brow. WD SCC wtih  superficial infiltration. Excised: 05/14/2007, margins free   Squamous cell carcinoma of skin 05/16/2016   Crown. WD SCC, ulcerated. Excised: 07/03/2016, residual MD SCC, deep margin involved. Excised: 07/17/2016, margins free.   TIA (transient ischemic attack) 09/13/2015   Urinary outflow obstruction    mild   Past Surgical History:  Procedure Laterality Date   CATARACT EXTRACTION Bilateral    PARTIAL HIP ARTHROPLASTY  01/2019   PILONIDAL CYST EXCISION  1951   removal   ROTATOR CUFF REPAIR  1995   SKIN CANCER EXCISION     multiple   SQUAMOUS CELL CARCINOMA EXCISION     behind head   TONSILLECTOMY AND ADENOIDECTOMY  1938   Family History  Problem Relation Age of Onset   Heart disease Father        heart atack   Alzheimer's disease Mother    CVA Brother    Social History   Socioeconomic History   Marital status: Widowed    Spouse name: Not on file   Number of children: Not on file   Years of education: Not on file   Highest education level: Bachelor's degree (e.g., BA, AB, BS)  Occupational History   Not on file  Tobacco Use   Smoking status: Never    Passive exposure: Never   Smokeless tobacco: Never  Vaping Use   Vaping status: Never Used  Substance and Sexual Activity   Alcohol use: No    Alcohol/week: 0.0 standard drinks of alcohol   Drug  use: No   Sexual activity: Not Currently    Birth control/protection: None  Other Topics Concern   Not on file  Social History Narrative   Retired, married. Walks everyday         Social Drivers of Health   Tobacco Use: Low Risk (11/15/2024)   Patient History    Smoking Tobacco Use: Never    Smokeless Tobacco Use: Never    Passive Exposure: Never  Financial Resource Strain: Low Risk (10/24/2023)   Overall Financial Resource Strain (CARDIA)    Difficulty of Paying Living Expenses: Not hard at all  Food Insecurity: No Food Insecurity (10/24/2023)   Hunger Vital Sign    Worried About Running Out of Food in the Last  Year: Never true    Ran Out of Food in the Last Year: Never true  Transportation Needs: No Transportation Needs (10/24/2023)   PRAPARE - Administrator, Civil Service (Medical): No    Lack of Transportation (Non-Medical): No  Physical Activity: Unknown (10/24/2023)   Exercise Vital Sign    Days of Exercise per Week: 0 days    Minutes of Exercise per Session: Not on file  Stress: No Stress Concern Present (10/24/2023)   [employer] of Occupational Health - Occupational Stress Questionnaire    Feeling of Stress : Only a little  Social Connections: Moderately Integrated (10/24/2023)   Social Connection and Isolation Panel    Frequency of Communication with Friends and Family: More than three times a week    Frequency of Social Gatherings with Friends and Family: Three times a week    Attends Religious Services: More than 4 times per year    Active Member of Clubs or Organizations: Yes    Attends Banker Meetings: More than 4 times per year    Marital Status: Widowed  Depression (PHQ2-9): Low Risk (11/12/2024)   Depression (PHQ2-9)    PHQ-2 Score: 0  Alcohol Screen: Not on file  Housing: Unknown (04/21/2024)   Received from [facility]   Epic    In the last 12 months, was there a time when you were not able to pay the mortgage or rent on time?: No    Number of Times Moved in the Last Year: Not on file    At any time in the past 12 months, were you homeless or living in a shelter (including now)?: No  Utilities: Not At Risk (06/20/2023)   Received from [facility] Utilities    Threatened with loss of utilities: No  Health Literacy: Not on file     Review of Systems  Constitutional:  Negative for appetite change and unexpected weight change.  HENT:  Negative for congestion and sinus pressure.   Respiratory:  Negative for cough, chest tightness and shortness of breath.   Cardiovascular:  Negative for chest pain  and palpitations.       No increased swelling.   Gastrointestinal:  Negative for abdominal pain, diarrhea, nausea and vomiting.  Genitourinary:  Negative for difficulty urinating and dysuria.  Musculoskeletal:  Negative for joint swelling and myalgias.  Skin:  Negative for color change and rash.  Neurological:  Negative for dizziness and headaches.  Psychiatric/Behavioral:  Negative for agitation and dysphoric mood.        Objective:     BP (!) 112/56   Pulse 68   Temp 98.1 F (36.7 C) (Oral)   Ht 5' 11 (1.803 m)  Wt 196 lb (88.9 kg) Comment: pt reported  SpO2 98%   BMI 27.34 kg/m  Wt Readings from Last 3 Encounters:  11/12/24 196 lb (88.9 kg)  07/17/24 203 lb (92.1 kg)  07/03/24 198 lb (89.8 kg)    Physical Exam Constitutional:      General: He is not in acute distress.    Appearance: Normal appearance. He is well-developed.  HENT:     Head: Normocephalic and atraumatic.     Right Ear: External ear normal.     Left Ear: External ear normal.     Mouth/Throat:     Pharynx: No oropharyngeal exudate or posterior oropharyngeal erythema.  Eyes:     General: No scleral icterus.       Right eye: No discharge.        Left eye: No discharge.  Cardiovascular:     Rate and Rhythm: Normal rate and regular rhythm.  Pulmonary:     Effort: Pulmonary effort is normal. No respiratory distress.     Breath sounds: Normal breath sounds.  Abdominal:     General: Bowel sounds are normal.     Palpations: Abdomen is soft.     Tenderness: There is no abdominal tenderness.  Musculoskeletal:        General: No swelling or tenderness.     Cervical back: Neck supple. No tenderness.  Lymphadenopathy:     Cervical: No cervical adenopathy.  Skin:    Findings: No erythema or rash.  Neurological:     Mental Status: He is alert.  Psychiatric:        Mood and Affect: Mood normal.        Behavior: Behavior normal.         Outpatient Encounter Medications as of 11/12/2024  Medication  Sig   Accu-Chek FastClix Lancets MISC USE TO TEST BLOOD SUGAR ONCE DAILY   amLODipine  (NORVASC ) 10 MG tablet TAKE 1 TABLET BY MOUTH DAILY   aspirin  81 MG EC tablet Take 81 mg by mouth daily.   calcium -vitamin D  (OSCAL WITH D) 500-200 MG-UNIT tablet Take 1 tablet by mouth 2 (two) times daily.   finasteride  (PROSCAR ) 5 MG tablet Take 1 tablet by mouth once daily   folic acid  (FOLVITE ) 1 MG tablet Take 1 tablet (1 mg total) by mouth daily.   furosemide  (LASIX ) 40 MG tablet Take 1 tablet (40 mg total) by mouth daily.   glucose blood (ACCU-CHEK GUIDE TEST) test strip Use to check blood sugars once daily. Dx E11.9   lovastatin  (MEVACOR ) 20 MG tablet Take 1 tablet (20 mg total) by mouth at bedtime.   metFORMIN  (GLUCOPHAGE -XR) 500 MG 24 hr tablet TAKE 1 TABLET BY MOUTH DAILY WITH BREAKFAST   mycophenolate  (CELLCEPT ) 500 MG tablet Take 1,000 mg by mouth 2 (two) times daily.   olmesartan  (BENICAR ) 40 MG tablet TAKE 1 TABLET BY MOUTH DAILY   pantoprazole  (PROTONIX ) 20 MG tablet Take 1 tablet (20 mg total) by mouth daily.   potassium chloride  SA (KLOR-CON  M) 20 MEQ tablet Take 1 tablet (20 mEq total) by mouth daily.   sodium chloride  1 g tablet Take 1 tablet (1 g total) by mouth daily.   tamsulosin  (FLOMAX ) 0.4 MG CAPS capsule TAKE 1 CAPSULE BY MOUTH ONCE DAILY AFTER  SUPPER   [DISCONTINUED] folic acid  (FOLVITE ) 1 MG tablet TAKE 1 TABLET BY MOUTH DAILY   [DISCONTINUED] furosemide  (LASIX ) 40 MG tablet TAKE 1 TABLET BY MOUTH DAILY   [DISCONTINUED] potassium chloride  SA (KLOR-CON  M) 20  MEQ tablet TAKE 1 TABLET BY MOUTH DAILY   [DISCONTINUED] sodium chloride  1 g tablet TAKE 1 TABLET BY MOUTH DAILY   No facility-administered encounter medications on file as of 11/12/2024.     Lab Results  Component Value Date   WBC 7.7 07/16/2024   HGB 10.2 (L) 07/16/2024   HCT 31.1 (L) 07/16/2024   PLT 394 07/16/2024   GLUCOSE 102 (H) 11/12/2024   CHOL 93 11/12/2024   TRIG 97.0 11/12/2024   HDL 37.60 (L) 11/12/2024    LDLCALC 36 11/12/2024   ALT 10 11/12/2024   AST 18 11/12/2024   NA 139 11/12/2024   K 4.8 11/12/2024   CL 100 11/12/2024   CREATININE 0.79 11/12/2024   BUN 14 11/12/2024   CO2 30 11/12/2024   TSH 2.64 11/12/2024   PSA 0.02 (L) 04/16/2018   HGBA1C 6.6 (H) 11/12/2024    US  RENAL Result Date: 01/04/2023 CLINICAL DATA:  Recurrent UTIs EXAM: RENAL / URINARY TRACT ULTRASOUND COMPLETE COMPARISON:  None Available. FINDINGS: Right Kidney: Renal measurements: 10.6 x 5.8 x 5.6 cm = volume: 183 mL. Echogenicity within normal limits. No mass or hydronephrosis visualized. Left Kidney: Renal measurements: 10.8 x 4.4 x 5.8 cm = volume: 152 mL. Echogenicity within normal limits. No mass or hydronephrosis visualized. Bladder: Appears normal for degree of bladder distention. Other: None. IMPRESSION: Normal study. Electronically Signed   By: Alm Pouch III M.D.   On: 01/04/2023 17:00       Assessment & Plan:  Myasthenia gravis [facility]) Assessment & Plan: Followed by neurology.  Stable.  Continue cellcept . Follow cbc. Continue f/u with neurology.    Essential (primary) hypertension  Type 2 diabetes mellitus with hyperglycemia, without long-term current use of insulin  (HCC) Assessment & Plan: Sugars as outlined. Appear to be doing well. Continues on metformin . Check met b and A1c today.  Lab Results  Component Value Date   HGBA1C 6.6 (H) 11/12/2024     Orders: -     Basic metabolic panel with GFR -     Hemoglobin A1c  Hypercholesterolemia Assessment & Plan: On lovastatin .  Low cholesterol diet and exercise.  Check lipid panel today.  Lab Results  Component Value Date   CHOL 93 11/12/2024   HDL 37.60 (L) 11/12/2024   LDLCALC 36 11/12/2024   TRIG 97.0 11/12/2024   CHOLHDL 2 11/12/2024     Orders: -     Hepatic function panel -     Lipid panel -     TSH  Iron  deficiency anemia, unspecified iron  deficiency anemia type Assessment & Plan: Seeing Dr Jacobo for IDA.  Receiving IV  venofer . Continue to follow cbc and iron  studies.   Orders: -     CBC with Differential/Platelet; Future  Hyponatremia Assessment & Plan: Taking sodium tablets. Also on lasix . Check sodium level today.    HYPERTENSION, BENIGN Assessment & Plan: On amlodipine  and benicar .  Follow pressures.  Follow metabolic panel. Blood pressure recheck by me 124/62. No change in medication.    Foot-drop, unspecified laterality Assessment & Plan: EMG - [facility] - There is electrodiagnostic evidence of a right greater than left lumbosacral plexopahty. There also is electrodiagnostic evidence of a background sensorimotor polyneuropathy. Just completed PT.    Diabetic polyneuropathy associated with type 2 diabetes mellitus (HCC) Assessment & Plan: EMG - [facility] - There is electrodiagnostic evidence of a right greater than left lumbosacral plexopahty. There also is electrodiagnostic evidence of a background sensorimotor polyneuropathy. Trial of alpha lipoic acid.  Other orders -     Folic Acid ; Take 1 tablet (1 mg total) by mouth daily.  Dispense: 90 tablet; Refill: 1 -     Furosemide ; Take 1 tablet (40 mg total) by mouth daily.  Dispense: 90 tablet; Refill: 1 -     Potassium Chloride  Crys ER; Take 1 tablet (20 mEq total) by mouth daily.  Dispense: 90 tablet; Refill: 1 -     Sodium Chloride ; Take 1 tablet (1 g total) by mouth daily.  Dispense: 90 tablet; Refill: 1     Allena Hamilton, MD "

## 2024-11-12 NOTE — Patient Instructions (Signed)
 Alpha lipoic acid

## 2024-11-13 LAB — BASIC METABOLIC PANEL WITH GFR
BUN: 14 mg/dL (ref 6–23)
CO2: 30 meq/L (ref 19–32)
Calcium: 9 mg/dL (ref 8.4–10.5)
Chloride: 100 meq/L (ref 96–112)
Creatinine, Ser: 0.79 mg/dL (ref 0.40–1.50)
GFR: 75.19 mL/min
Glucose, Bld: 102 mg/dL — ABNORMAL HIGH (ref 70–99)
Potassium: 4.8 meq/L (ref 3.5–5.1)
Sodium: 139 meq/L (ref 135–145)

## 2024-11-13 LAB — LIPID PANEL
Cholesterol: 93 mg/dL (ref 28–200)
HDL: 37.6 mg/dL — ABNORMAL LOW
LDL Cholesterol: 36 mg/dL (ref 10–99)
NonHDL: 55.67
Total CHOL/HDL Ratio: 2
Triglycerides: 97 mg/dL (ref 10.0–149.0)
VLDL: 19.4 mg/dL (ref 0.0–40.0)

## 2024-11-13 LAB — HEPATIC FUNCTION PANEL
ALT: 10 U/L (ref 3–53)
AST: 18 U/L (ref 5–37)
Albumin: 3.9 g/dL (ref 3.5–5.2)
Alkaline Phosphatase: 50 U/L (ref 39–117)
Bilirubin, Direct: 0.1 mg/dL (ref 0.1–0.3)
Total Bilirubin: 0.6 mg/dL (ref 0.2–1.2)
Total Protein: 7.2 g/dL (ref 6.0–8.3)

## 2024-11-13 LAB — TSH: TSH: 2.64 u[IU]/mL (ref 0.35–5.50)

## 2024-11-13 LAB — HEMOGLOBIN A1C: Hgb A1c MFr Bld: 6.6 % — ABNORMAL HIGH (ref 4.6–6.5)

## 2024-11-13 NOTE — Telephone Encounter (Signed)
 Called and spoke with Pharmacist.  She informed me that the price has increased due to patient being at the beginning of the year and the cost of the medication will go to deductible.  Any medication that he is changed to will be the same.  No appropriate alternative given.

## 2024-11-13 NOTE — Telephone Encounter (Signed)
 Please call the pharmacy and see if there is a form of potassium that would be cheaper for him. He is currently on extended release form. If so, need to know exactly how to order.  He is currently on 20meq q day.

## 2024-11-13 NOTE — Telephone Encounter (Signed)
 Called and spoke with pt, he is aware that the increased price is due to it being a new year and needing to meet is deductible.  Pt verbalizes understanding. He thought the $41 was for a 30 day supply.

## 2024-11-13 NOTE — Telephone Encounter (Signed)
 Thank you for calling. Can you let [name] know that it is not going to help to change, but apparently will go down after he meets his deductible.

## 2024-11-14 ENCOUNTER — Ambulatory Visit: Payer: Self-pay | Admitting: Internal Medicine

## 2024-11-15 ENCOUNTER — Encounter: Payer: Self-pay | Admitting: Internal Medicine

## 2024-11-15 NOTE — Assessment & Plan Note (Signed)
 EMG - [facility] - There is electrodiagnostic evidence of a right greater than left lumbosacral plexopahty. There also is electrodiagnostic evidence of a background sensorimotor polyneuropathy. Just completed PT.

## 2024-11-15 NOTE — Assessment & Plan Note (Signed)
 Taking sodium tablets. Also on lasix . Check sodium level today.

## 2024-11-15 NOTE — Assessment & Plan Note (Signed)
 On lovastatin .  Low cholesterol diet and exercise.  Check lipid panel today.  Lab Results  Component Value Date   CHOL 93 11/12/2024   HDL 37.60 (L) 11/12/2024   LDLCALC 36 11/12/2024   TRIG 97.0 11/12/2024   CHOLHDL 2 11/12/2024

## 2024-11-15 NOTE — Assessment & Plan Note (Signed)
 Seeing Dr Jacobo for IDA.  Receiving IV venofer . Continue to follow cbc and iron  studies.

## 2024-11-15 NOTE — Assessment & Plan Note (Signed)
 EMG - [facility] - There is electrodiagnostic evidence of a right greater than left lumbosacral plexopahty. There also is electrodiagnostic evidence of a background sensorimotor polyneuropathy. Trial of alpha lipoic acid.

## 2024-11-15 NOTE — Assessment & Plan Note (Signed)
 Followed by neurology.  Stable.  Continue cellcept . Follow cbc. Continue f/u with neurology.

## 2024-11-15 NOTE — Assessment & Plan Note (Signed)
 Sugars as outlined. Appear to be doing well. Continues on metformin . Check met b and A1c today.  Lab Results  Component Value Date   HGBA1C 6.6 (H) 11/12/2024

## 2024-11-15 NOTE — Assessment & Plan Note (Signed)
 On amlodipine  and benicar .  Follow pressures.  Follow metabolic panel. Blood pressure recheck by me 124/62. No change in medication.

## 2024-11-21 ENCOUNTER — Other Ambulatory Visit: Payer: Self-pay | Admitting: Internal Medicine

## 2024-11-21 DIAGNOSIS — I10 Essential (primary) hypertension: Secondary | ICD-10-CM

## 2024-11-28 ENCOUNTER — Other Ambulatory Visit: Payer: Self-pay | Admitting: Internal Medicine

## 2024-12-03 ENCOUNTER — Encounter: Payer: Self-pay | Admitting: Oncology

## 2024-12-11 ENCOUNTER — Other Ambulatory Visit: Payer: Self-pay | Admitting: Internal Medicine

## 2025-01-28 ENCOUNTER — Ambulatory Visit: Admitting: Podiatry

## 2025-03-15 ENCOUNTER — Ambulatory Visit: Admitting: Internal Medicine

## 2025-07-01 ENCOUNTER — Ambulatory Visit: Admitting: Dermatology

## 2025-08-18 ENCOUNTER — Ambulatory Visit: Admitting: Urology
